# Patient Record
Sex: Female | Born: 1945 | Race: White | Hispanic: No | Smoking: Former smoker
Health system: Southern US, Community
[De-identification: ages and names within clinical notes are randomized; demographics above are authoritative.]

## PROBLEM LIST (undated history)

## (undated) DIAGNOSIS — T4145XA Adverse effect of unspecified anesthetic, initial encounter: Secondary | ICD-10-CM

## (undated) DIAGNOSIS — I714 Abdominal aortic aneurysm, without rupture, unspecified: Secondary | ICD-10-CM

## (undated) DIAGNOSIS — I080 Rheumatic disorders of both mitral and aortic valves: Secondary | ICD-10-CM

## (undated) DIAGNOSIS — Z9889 Other specified postprocedural states: Secondary | ICD-10-CM

## (undated) DIAGNOSIS — I10 Essential (primary) hypertension: Secondary | ICD-10-CM

## (undated) DIAGNOSIS — R410 Disorientation, unspecified: Secondary | ICD-10-CM

## (undated) DIAGNOSIS — E119 Type 2 diabetes mellitus without complications: Secondary | ICD-10-CM

## (undated) DIAGNOSIS — R011 Cardiac murmur, unspecified: Secondary | ICD-10-CM

## (undated) DIAGNOSIS — I2510 Atherosclerotic heart disease of native coronary artery without angina pectoris: Secondary | ICD-10-CM

## (undated) DIAGNOSIS — E7800 Pure hypercholesterolemia, unspecified: Secondary | ICD-10-CM

## (undated) DIAGNOSIS — I4891 Unspecified atrial fibrillation: Secondary | ICD-10-CM

## (undated) DIAGNOSIS — N185 Chronic kidney disease, stage 5: Secondary | ICD-10-CM

## (undated) DIAGNOSIS — R112 Nausea with vomiting, unspecified: Secondary | ICD-10-CM

## (undated) DIAGNOSIS — I6529 Occlusion and stenosis of unspecified carotid artery: Secondary | ICD-10-CM

## (undated) DIAGNOSIS — T8859XA Other complications of anesthesia, initial encounter: Secondary | ICD-10-CM

## (undated) DIAGNOSIS — I739 Peripheral vascular disease, unspecified: Secondary | ICD-10-CM

## (undated) DIAGNOSIS — M810 Age-related osteoporosis without current pathological fracture: Secondary | ICD-10-CM

## (undated) HISTORY — DX: Type 2 diabetes mellitus without complications: E11.9

## (undated) HISTORY — DX: Rheumatic disorders of both mitral and aortic valves: I08.0

## (undated) HISTORY — PX: AORTIC VALVE REPLACEMENT: SHX41

## (undated) HISTORY — DX: Chronic kidney disease, stage 5: N18.5

## (undated) HISTORY — DX: Occlusion and stenosis of unspecified carotid artery: I65.29

## (undated) HISTORY — DX: Disorientation, unspecified: R41.0

## (undated) HISTORY — DX: Peripheral vascular disease, unspecified: I73.9

## (undated) HISTORY — DX: Essential (primary) hypertension: I10

## (undated) HISTORY — DX: Pure hypercholesterolemia, unspecified: E78.00

## (undated) HISTORY — DX: Atherosclerotic heart disease of native coronary artery without angina pectoris: I25.10

## (undated) HISTORY — PX: TOTAL ABDOMINAL HYSTERECTOMY: SHX209

## (undated) HISTORY — DX: Cardiac murmur, unspecified: R01.1

## (undated) HISTORY — DX: Abdominal aortic aneurysm, without rupture, unspecified: I71.40

## (undated) HISTORY — DX: Abdominal aortic aneurysm, without rupture: I71.4

## (undated) HISTORY — DX: Unspecified atrial fibrillation: I48.91

## (undated) HISTORY — DX: Age-related osteoporosis without current pathological fracture: M81.0

---
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
## 1997-12-11 ENCOUNTER — Ambulatory Visit (HOSPITAL_COMMUNITY): Admission: RE | Admit: 1997-12-11 | Discharge: 1997-12-11 | Payer: Self-pay | Admitting: Orthopedic Surgery

## 2001-08-28 ENCOUNTER — Encounter: Admission: RE | Admit: 2001-08-28 | Discharge: 2001-08-28 | Payer: Self-pay | Admitting: Internal Medicine

## 2001-08-28 ENCOUNTER — Encounter: Payer: Self-pay | Admitting: Internal Medicine

## 2002-10-09 ENCOUNTER — Encounter: Payer: Self-pay | Admitting: Internal Medicine

## 2002-10-09 ENCOUNTER — Encounter: Admission: RE | Admit: 2002-10-09 | Discharge: 2002-10-09 | Payer: Self-pay | Admitting: Internal Medicine

## 2003-12-23 ENCOUNTER — Encounter: Admission: RE | Admit: 2003-12-23 | Discharge: 2003-12-23 | Payer: Self-pay | Admitting: Internal Medicine

## 2004-11-02 ENCOUNTER — Ambulatory Visit: Payer: Self-pay

## 2006-05-17 ENCOUNTER — Encounter: Admission: RE | Admit: 2006-05-17 | Discharge: 2006-05-17 | Payer: Self-pay | Admitting: Internal Medicine

## 2007-07-11 ENCOUNTER — Ambulatory Visit: Payer: Self-pay | Admitting: Vascular Surgery

## 2007-07-20 ENCOUNTER — Ambulatory Visit: Payer: Self-pay | Admitting: Gastroenterology

## 2007-08-08 ENCOUNTER — Encounter: Payer: Self-pay | Admitting: Gastroenterology

## 2007-08-08 ENCOUNTER — Ambulatory Visit: Payer: Self-pay | Admitting: Gastroenterology

## 2007-09-04 ENCOUNTER — Ambulatory Visit: Payer: Self-pay | Admitting: Gastroenterology

## 2007-09-04 LAB — CONVERTED CEMR LAB
Basophils Absolute: 0.1 10*3/uL (ref 0.0–0.1)
Basophils Relative: 1.4 % — ABNORMAL HIGH (ref 0.0–1.0)
Eosinophils Absolute: 0.3 10*3/uL (ref 0.0–0.6)
Eosinophils Relative: 2.6 % (ref 0.0–5.0)
Ferritin: 8.4 ng/mL — ABNORMAL LOW (ref 10.0–291.0)
Folate: 19.8 ng/mL
HCT: 36.2 % (ref 36.0–46.0)
Hemoglobin: 12.2 g/dL (ref 12.0–15.0)
Iron: 65 ug/dL (ref 42–145)
Lymphocytes Relative: 31.5 % (ref 12.0–46.0)
MCHC: 33.7 g/dL (ref 30.0–36.0)
MCV: 82.7 fL (ref 78.0–100.0)
Monocytes Absolute: 0.7 10*3/uL (ref 0.2–0.7)
Monocytes Relative: 7.2 % (ref 3.0–11.0)
Neutro Abs: 5.7 10*3/uL (ref 1.4–7.7)
Neutrophils Relative %: 57.3 % (ref 43.0–77.0)
Platelets: 282 10*3/uL (ref 150–400)
RBC: 4.38 M/uL (ref 3.87–5.11)
RDW: 13.5 % (ref 11.5–14.6)
Saturation Ratios: 11.7 % — ABNORMAL LOW (ref 20.0–50.0)
Transferrin: 397.6 mg/dL — ABNORMAL HIGH (ref >=212.0)
Vitamin B-12: 398 pg/mL (ref 211–911)
WBC: 9.9 10*3/uL (ref 4.5–10.5)

## 2008-06-10 ENCOUNTER — Encounter (INDEPENDENT_AMBULATORY_CARE_PROVIDER_SITE_OTHER): Payer: Self-pay | Admitting: Internal Medicine

## 2008-06-10 ENCOUNTER — Ambulatory Visit: Payer: Self-pay

## 2008-06-23 DIAGNOSIS — I10 Essential (primary) hypertension: Secondary | ICD-10-CM | POA: Insufficient documentation

## 2008-06-23 DIAGNOSIS — E7800 Pure hypercholesterolemia, unspecified: Secondary | ICD-10-CM | POA: Insufficient documentation

## 2008-06-23 DIAGNOSIS — R011 Cardiac murmur, unspecified: Secondary | ICD-10-CM | POA: Insufficient documentation

## 2008-06-25 ENCOUNTER — Ambulatory Visit: Payer: Self-pay | Admitting: Cardiology

## 2008-06-25 LAB — CONVERTED CEMR LAB
BUN: 13 mg/dL (ref 6–23)
CO2: 28 meq/L (ref 19–32)
Calcium: 9.6 mg/dL (ref 8.4–10.5)
Chloride: 107 meq/L (ref 96–112)
Creatinine, Ser: 1.1 mg/dL (ref 0.4–1.2)
GFR calc Af Amer: 65 mL/min
GFR calc non Af Amer: 53 mL/min
Glucose, Bld: 134 mg/dL — ABNORMAL HIGH (ref 70–99)
HCT: 37.8 % (ref 36.0–46.0)
Hemoglobin: 12.9 g/dL (ref 12.0–15.0)
INR: 1 (ref 0.8–1.0)
MCHC: 34.2 g/dL (ref 30.0–36.0)
MCV: 85.5 fL (ref 78.0–100.0)
Platelets: 239 10*3/uL (ref 150–400)
Potassium: 4 meq/L (ref 3.5–5.1)
Prothrombin Time: 10.4 s — ABNORMAL LOW (ref 10.9–13.3)
RBC: 4.42 M/uL (ref 3.87–5.11)
RDW: 12.2 % (ref 11.5–14.6)
Sodium: 140 meq/L (ref 135–145)
WBC: 9 10*3/uL (ref 4.5–10.5)
aPTT: 32.3 s — ABNORMAL HIGH (ref 21.7–29.8)

## 2008-06-27 ENCOUNTER — Inpatient Hospital Stay (HOSPITAL_BASED_OUTPATIENT_CLINIC_OR_DEPARTMENT_OTHER): Admission: RE | Admit: 2008-06-27 | Discharge: 2008-06-27 | Payer: Self-pay | Admitting: Cardiology

## 2008-06-27 ENCOUNTER — Ambulatory Visit: Payer: Self-pay | Admitting: Cardiology

## 2008-07-28 ENCOUNTER — Ambulatory Visit: Payer: Self-pay

## 2008-07-28 ENCOUNTER — Ambulatory Visit: Payer: Self-pay | Admitting: Cardiology

## 2008-08-04 ENCOUNTER — Ambulatory Visit: Payer: Self-pay | Admitting: Cardiology

## 2008-08-04 ENCOUNTER — Ambulatory Visit: Payer: Self-pay | Admitting: Thoracic Surgery (Cardiothoracic Vascular Surgery)

## 2008-08-07 ENCOUNTER — Ambulatory Visit (HOSPITAL_COMMUNITY)
Admission: RE | Admit: 2008-08-07 | Discharge: 2008-08-07 | Payer: Self-pay | Admitting: Thoracic Surgery (Cardiothoracic Vascular Surgery)

## 2008-08-07 ENCOUNTER — Encounter: Payer: Self-pay | Admitting: Thoracic Surgery (Cardiothoracic Vascular Surgery)

## 2008-08-08 IMAGING — CR DG CHEST 2V
2 series · 2 of 2 positions shown · non-contrast
Comparison: None available.

CLINICAL DATA: Aortic stenosis.

CHEST - 2 VIEW

[view not recorded (1 of 2)]
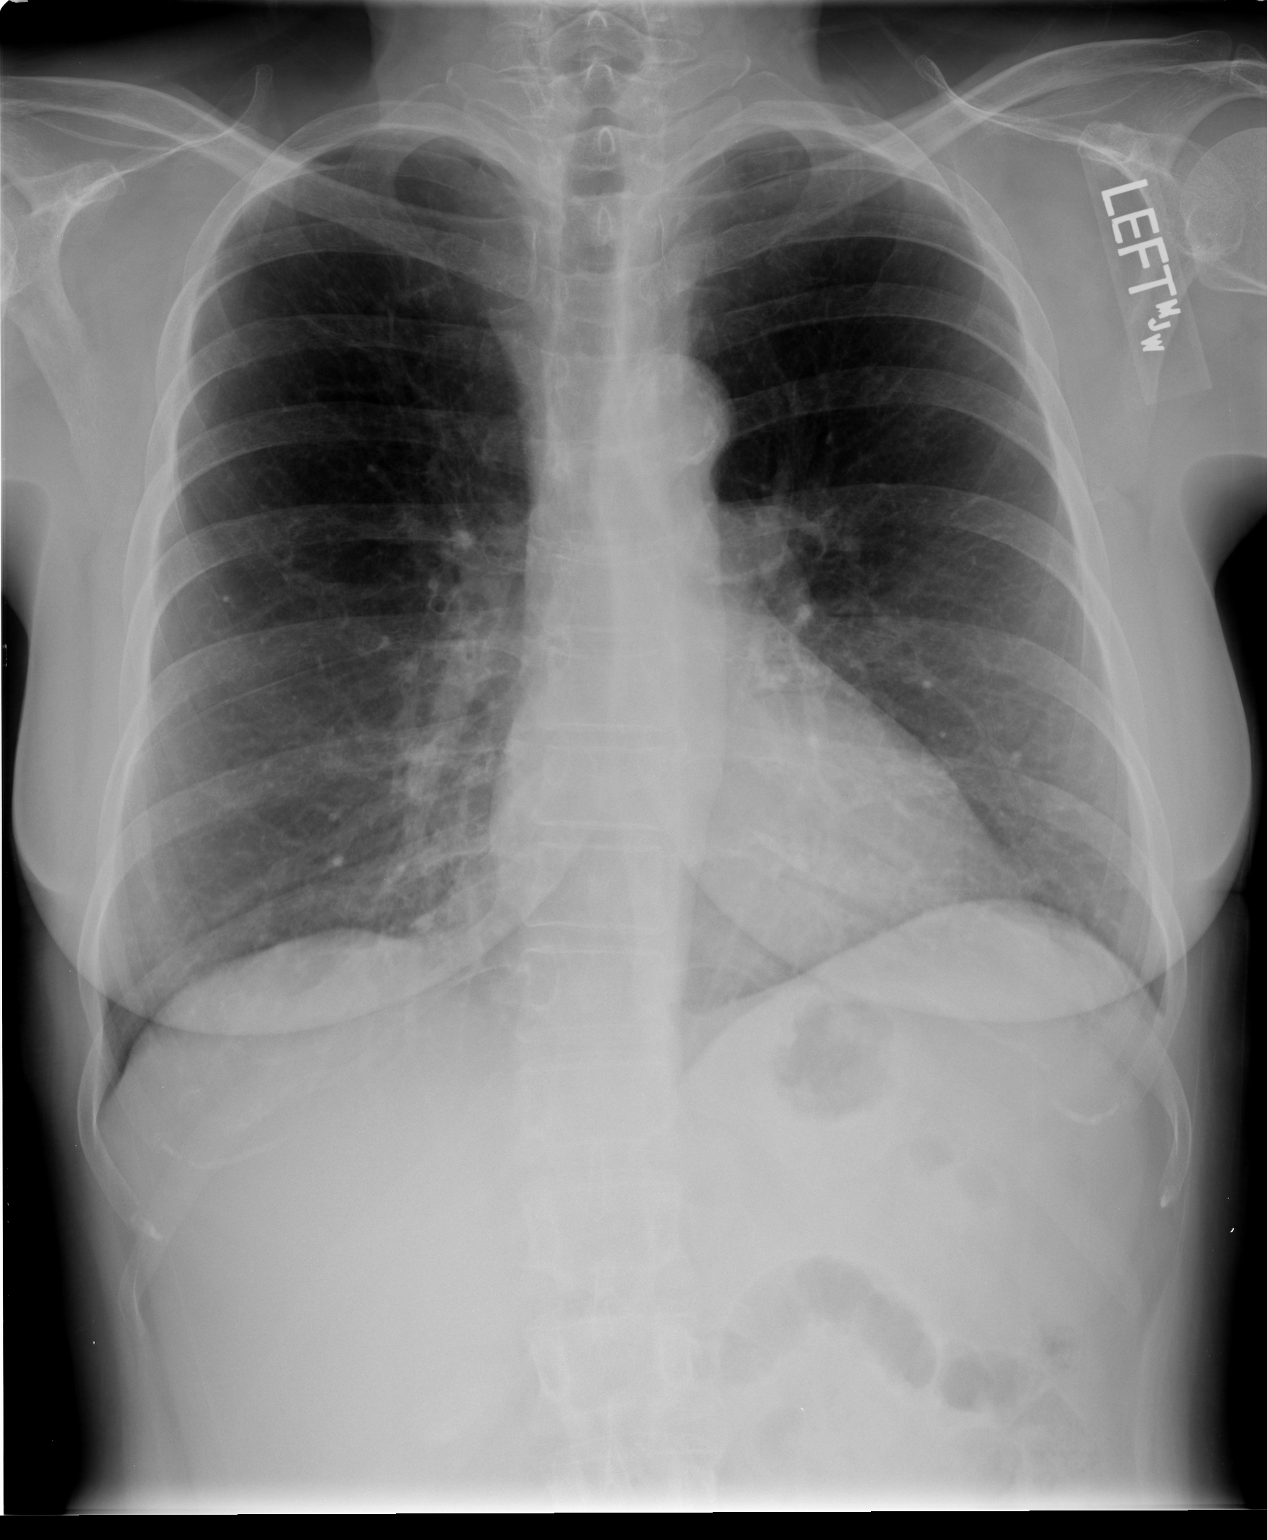

[view not recorded (2 of 2)]
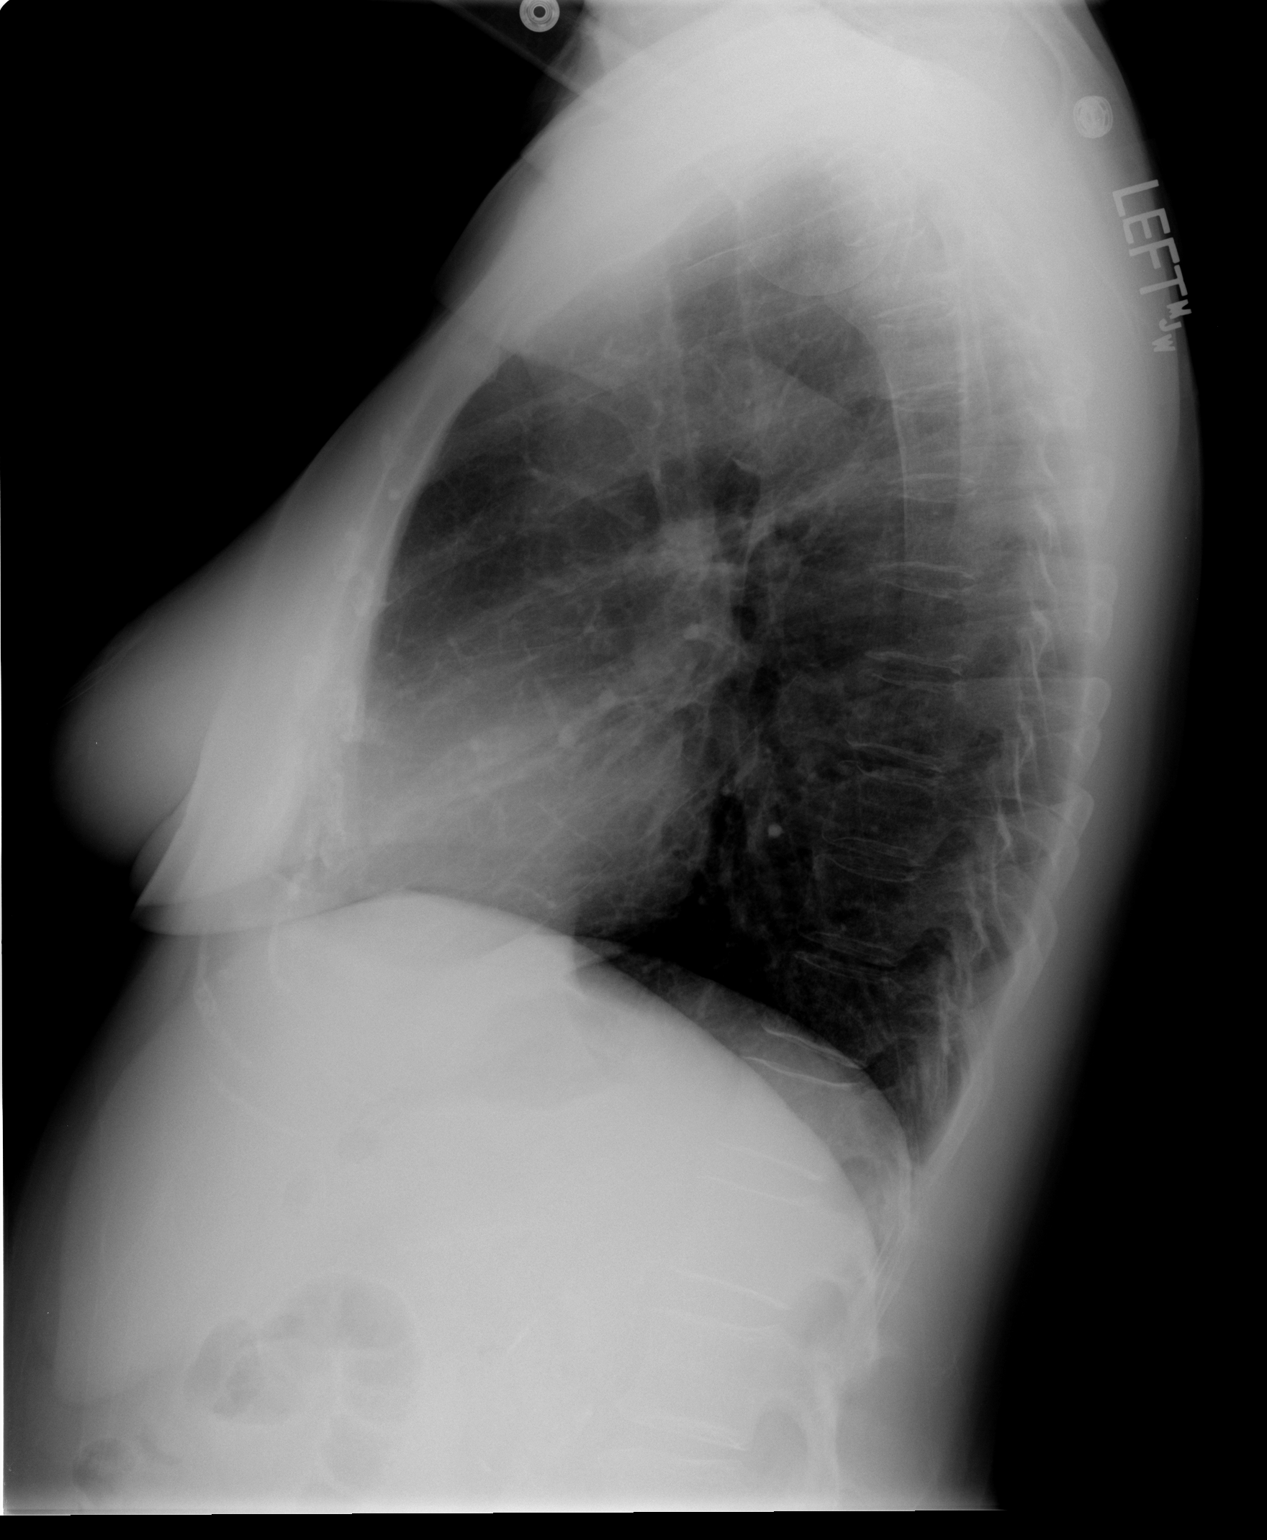

[2 of 2 positions shown; findings below may reference images not displayed]

FINDINGS: The chest is hyperexpanded with coarsened pulmonary
interstitium.  There is no focal airspace disease or effusion.
Heart size is normal.
IMPRESSION: Findings compatible with COPD/emphysema.  No acute process.

## 2008-08-14 ENCOUNTER — Inpatient Hospital Stay (HOSPITAL_COMMUNITY)
Admission: RE | Admit: 2008-08-14 | Discharge: 2008-08-25 | Payer: Self-pay | Admitting: Thoracic Surgery (Cardiothoracic Vascular Surgery)

## 2008-08-14 ENCOUNTER — Ambulatory Visit: Payer: Self-pay | Admitting: Thoracic Surgery (Cardiothoracic Vascular Surgery)

## 2008-08-14 ENCOUNTER — Encounter: Payer: Self-pay | Admitting: Thoracic Surgery (Cardiothoracic Vascular Surgery)

## 2008-08-14 DIAGNOSIS — Z952 Presence of prosthetic heart valve: Secondary | ICD-10-CM | POA: Insufficient documentation

## 2008-08-14 IMAGING — CR DG CHEST 1V PORT
1 series · 1 of 1 positions shown · non-contrast
Comparison: [DATE]

CLINICAL DATA: Status post CABG.  Aortic stenosis.  Chest tube.

PORTABLE CHEST - 1 VIEW

[view not recorded]
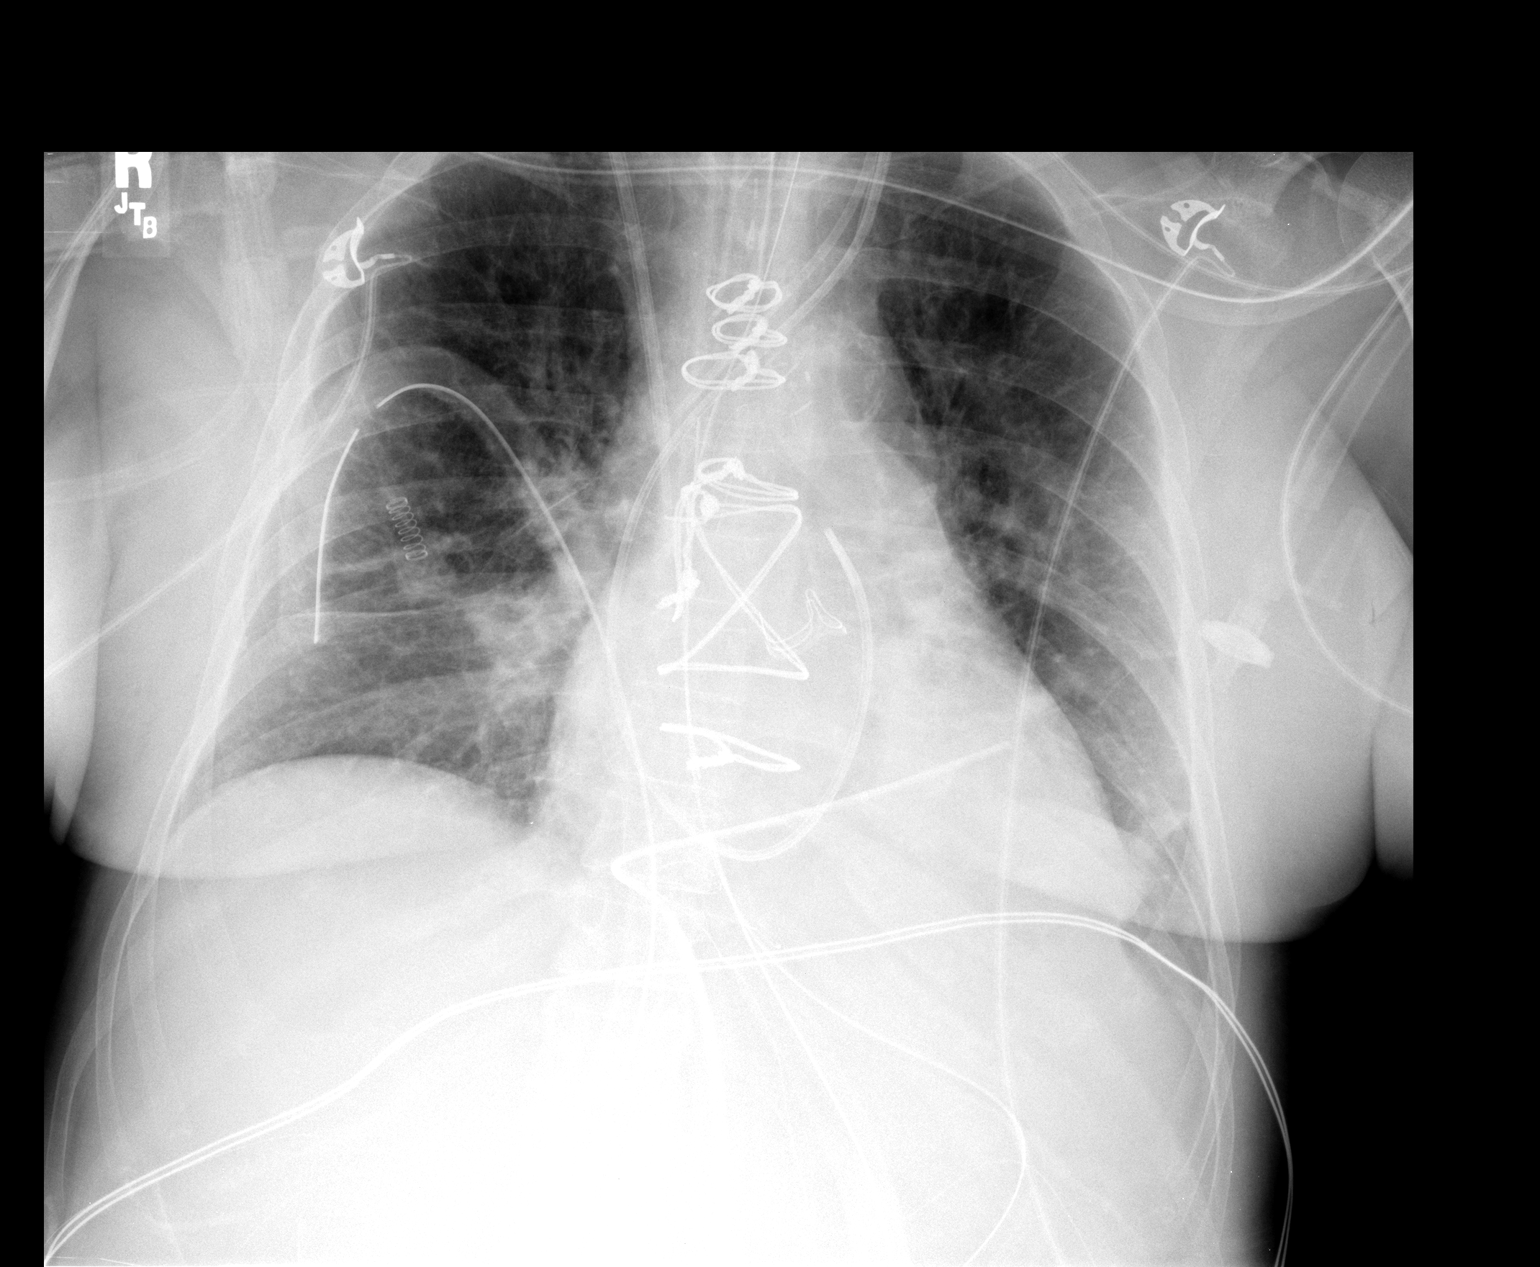

[1 of 1 positions shown; findings below may reference images not displayed]

FINDINGS: Interval median sternotomy.  Six intact wires.
Endotracheal tube terminates 5.0 cm above the carina.  A left IJ
Swan-Ganz catheter terminates at the pulmonary outflow tract.  A
left IJ central line terminates at the high SVC.  A right IJ
central line terminates at the high to mid SVC.

Bilateral chest tubes.  Probable mediastinal drain.  Nasogastric
tube extends beyond the  inferior aspect of the film.

No pneumothorax.

Midline trachea. Heart size upper limits of normal.  No pleural
effusion. Low lung volumes with resultant pulmonary interstitial
prominence.  Patchy bibasilar atelectasis.
IMPRESSION: 1.  Expected appearance after median sternotomy.
2.  Support apparatus appropriately positioned without
pneumothorax.
3.  Low lung volumes with patchy bibasilar atelectasis.

## 2008-08-15 IMAGING — CR DG CHEST 1V PORT
1 series · 1 of 1 positions shown · non-contrast
Comparison: 1 day prior

CLINICAL DATA: Air stenosis.  Status post the AVR

PORTABLE CHEST - 1 VIEW

[AP]
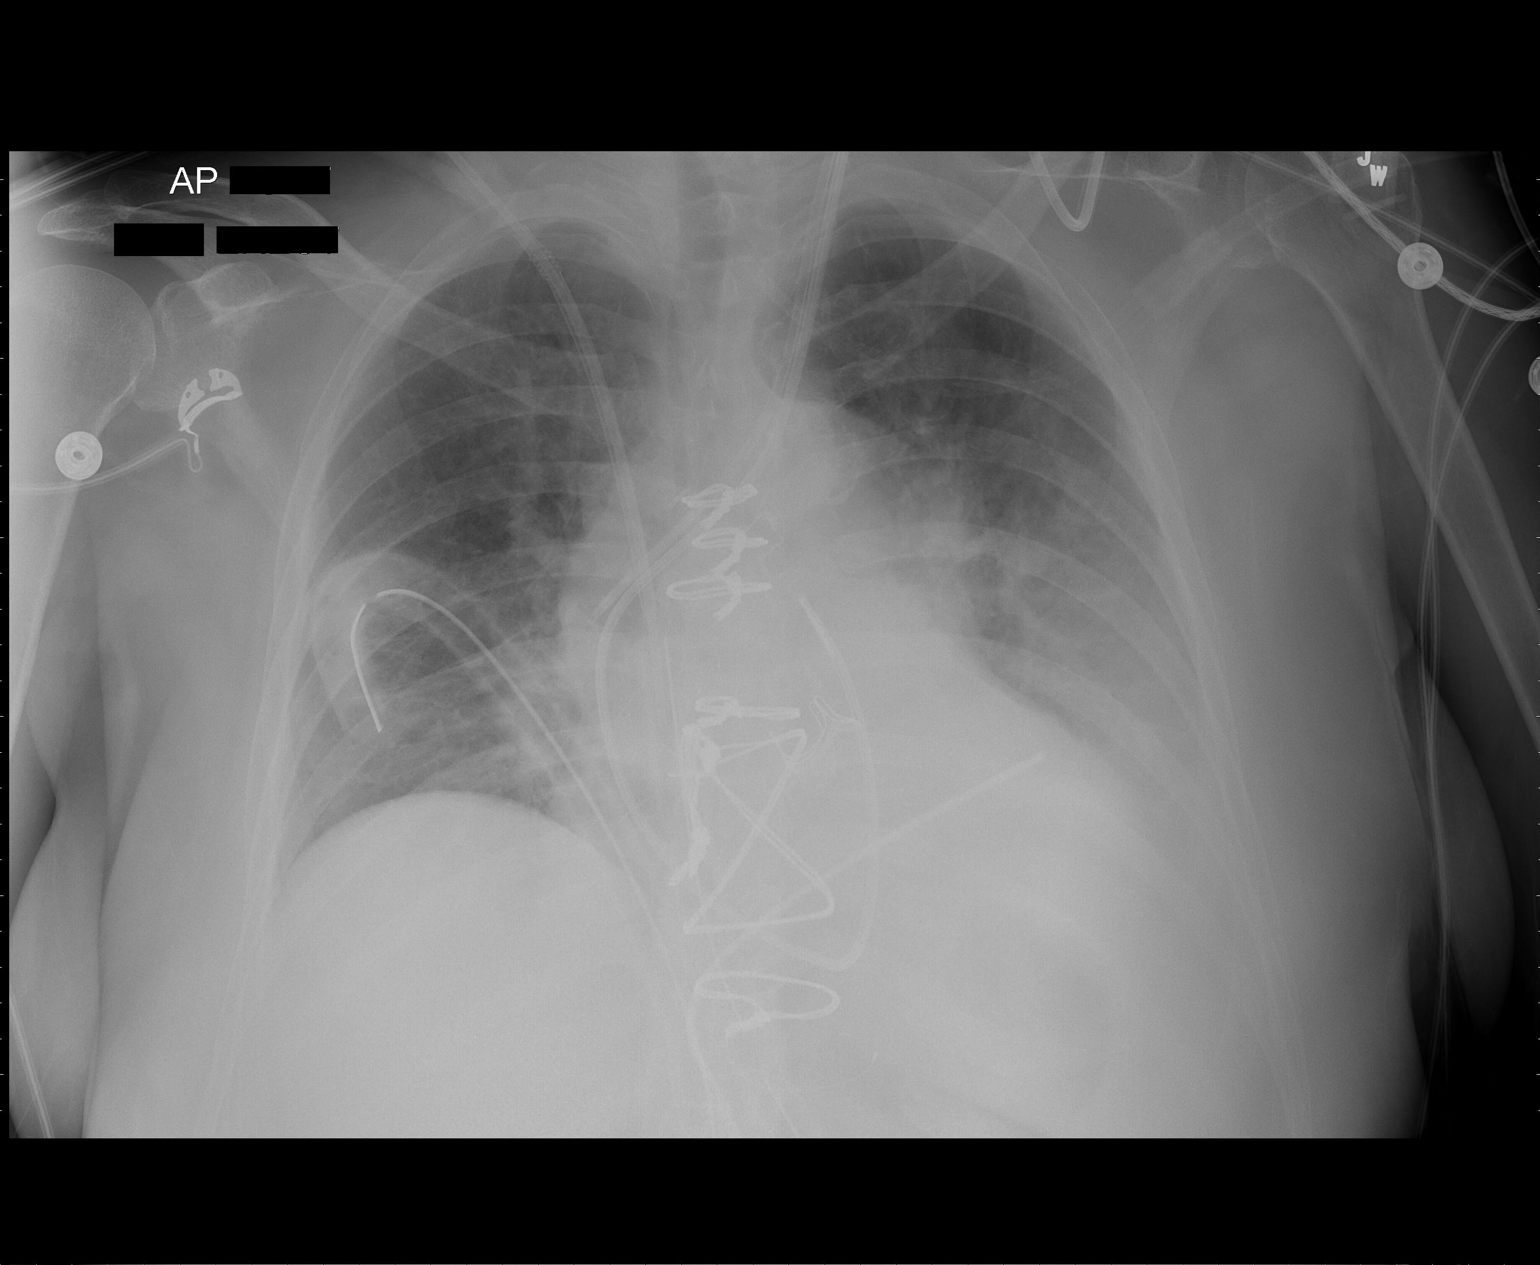

[1 of 1 positions shown; findings below may reference images not displayed]

FINDINGS: Removal of endotracheal and nasogastric tubes.  Left
sided Swan-Ganz catheter and bilateral internal jugular central
lines are again identified.

Bilateral chest tubes and mediastinal drain are unchanged. No
pneumothorax.  Midline trachea. Normal heart size for level of
inspiration.  Developing small left pleural effusion.  Decreased
lung volumes with mild interstitial edema.  Progressive left base
atelectasis.
IMPRESSION: 1.  Interval extubation with decreased lung volumes and increased
left base atelectasis.
2.  Development of mild interstitial edema and small left pleural
effusion.
3. No pneumothorax.

## 2008-08-16 ENCOUNTER — Encounter: Payer: Self-pay | Admitting: Cardiothoracic Surgery

## 2008-08-16 IMAGING — CR DG CHEST 1V PORT
1 series · 1 of 1 positions shown · non-contrast
Comparison: [DATE]

CLINICAL DATA: Aortic valve repair.

PORTABLE CHEST - 1 VIEW

[view not recorded]
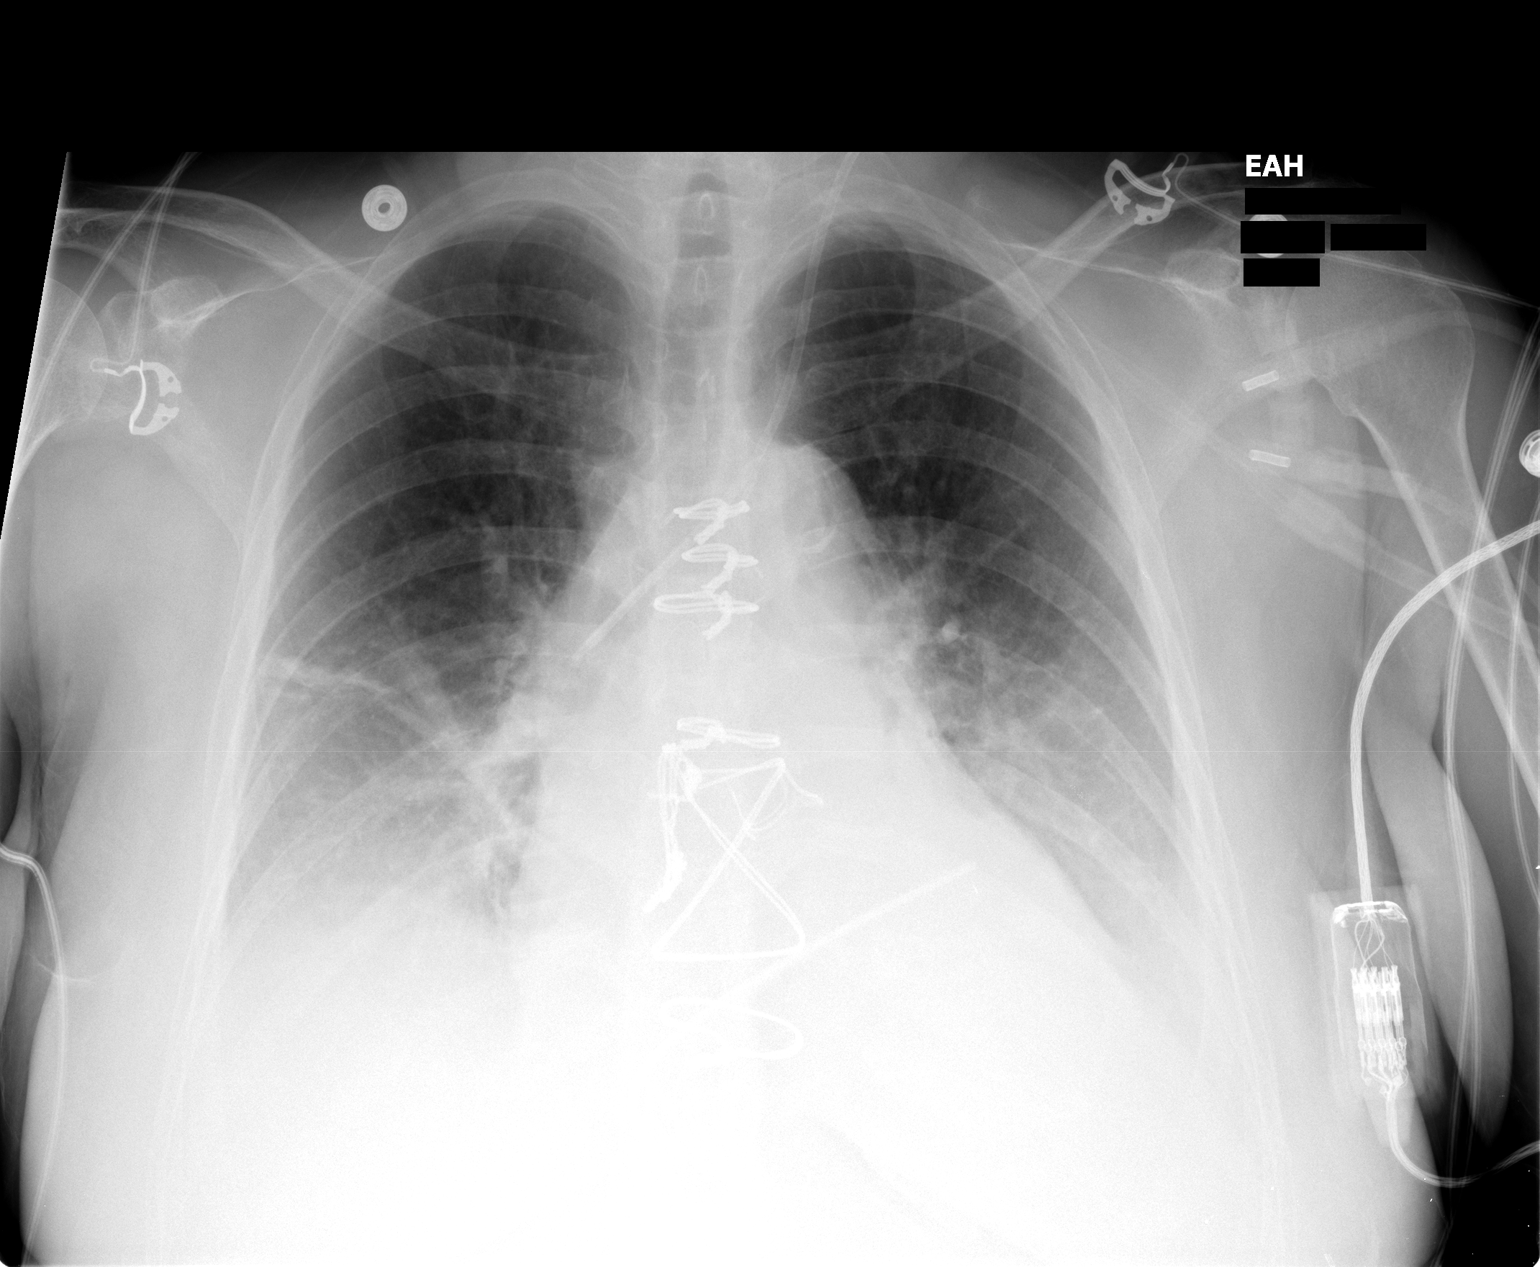

[1 of 1 positions shown; findings below may reference images not displayed]

FINDINGS: Interval removal of Swan-Ganz catheter and right chest
tube.  No pneumothorax.  Stable cardiomegaly.  Increasing right
base airspace disease, likely atelectasis.  Stable left basilar
atelectasis.  Suspect small layering bilateral effusions.
IMPRESSION: Interval removal of right chest tube without pneumothorax.

Stable left base atelectasis.  New right base atelectasis.

Small effusions.

## 2008-08-16 IMAGING — CR DG CHEST 1V PORT
1 series · 1 of 1 positions shown · non-contrast
Comparison: Earlier film of the same day

CLINICAL DATA: There is stenosis, PICC placement

PORTABLE CHEST - 1 VIEW

[view not recorded]
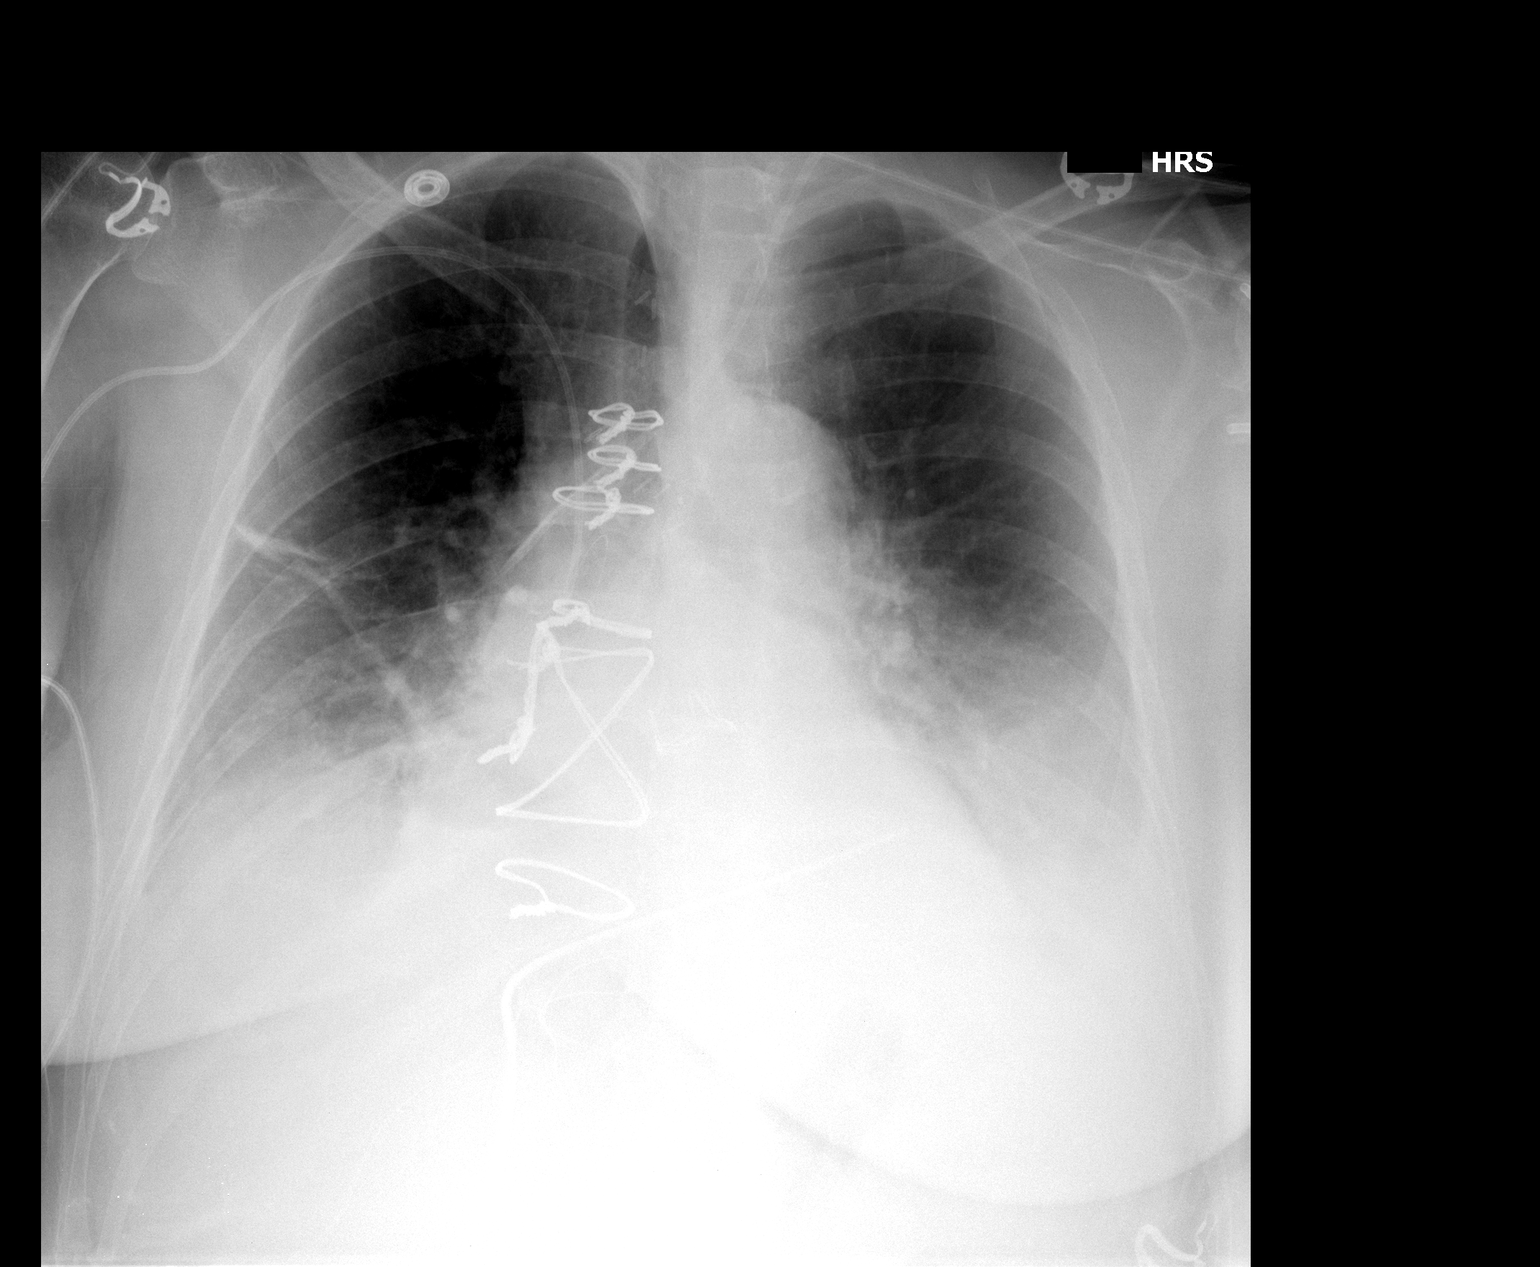

[1 of 1 positions shown; findings below may reference images not displayed]

FINDINGS: A right arm PICC has been advanced to the cavoatrial
junction.  Left IJ central line stable.  Changes of median
sternotomy and aortic valve replacement surgery.  Bibasilar
atelectasis/infiltrates, left greater than right, with probable
left effusion as before.  Heart size upper limits normal for
technique.  Mild central pulmonary vascular congestion.
IMPRESSION: 1.  Right arm PICC to cavoatrial junction.
2.  Otherwise stable appearance since earlier film

## 2008-08-17 IMAGING — CR DG CHEST 1V PORT
1 series · 1 of 1 positions shown · non-contrast
Comparison: [DATE]

CLINICAL DATA: Aortic stenosis

PORTABLE CHEST - 1 VIEW [NI] hours

[view not recorded]
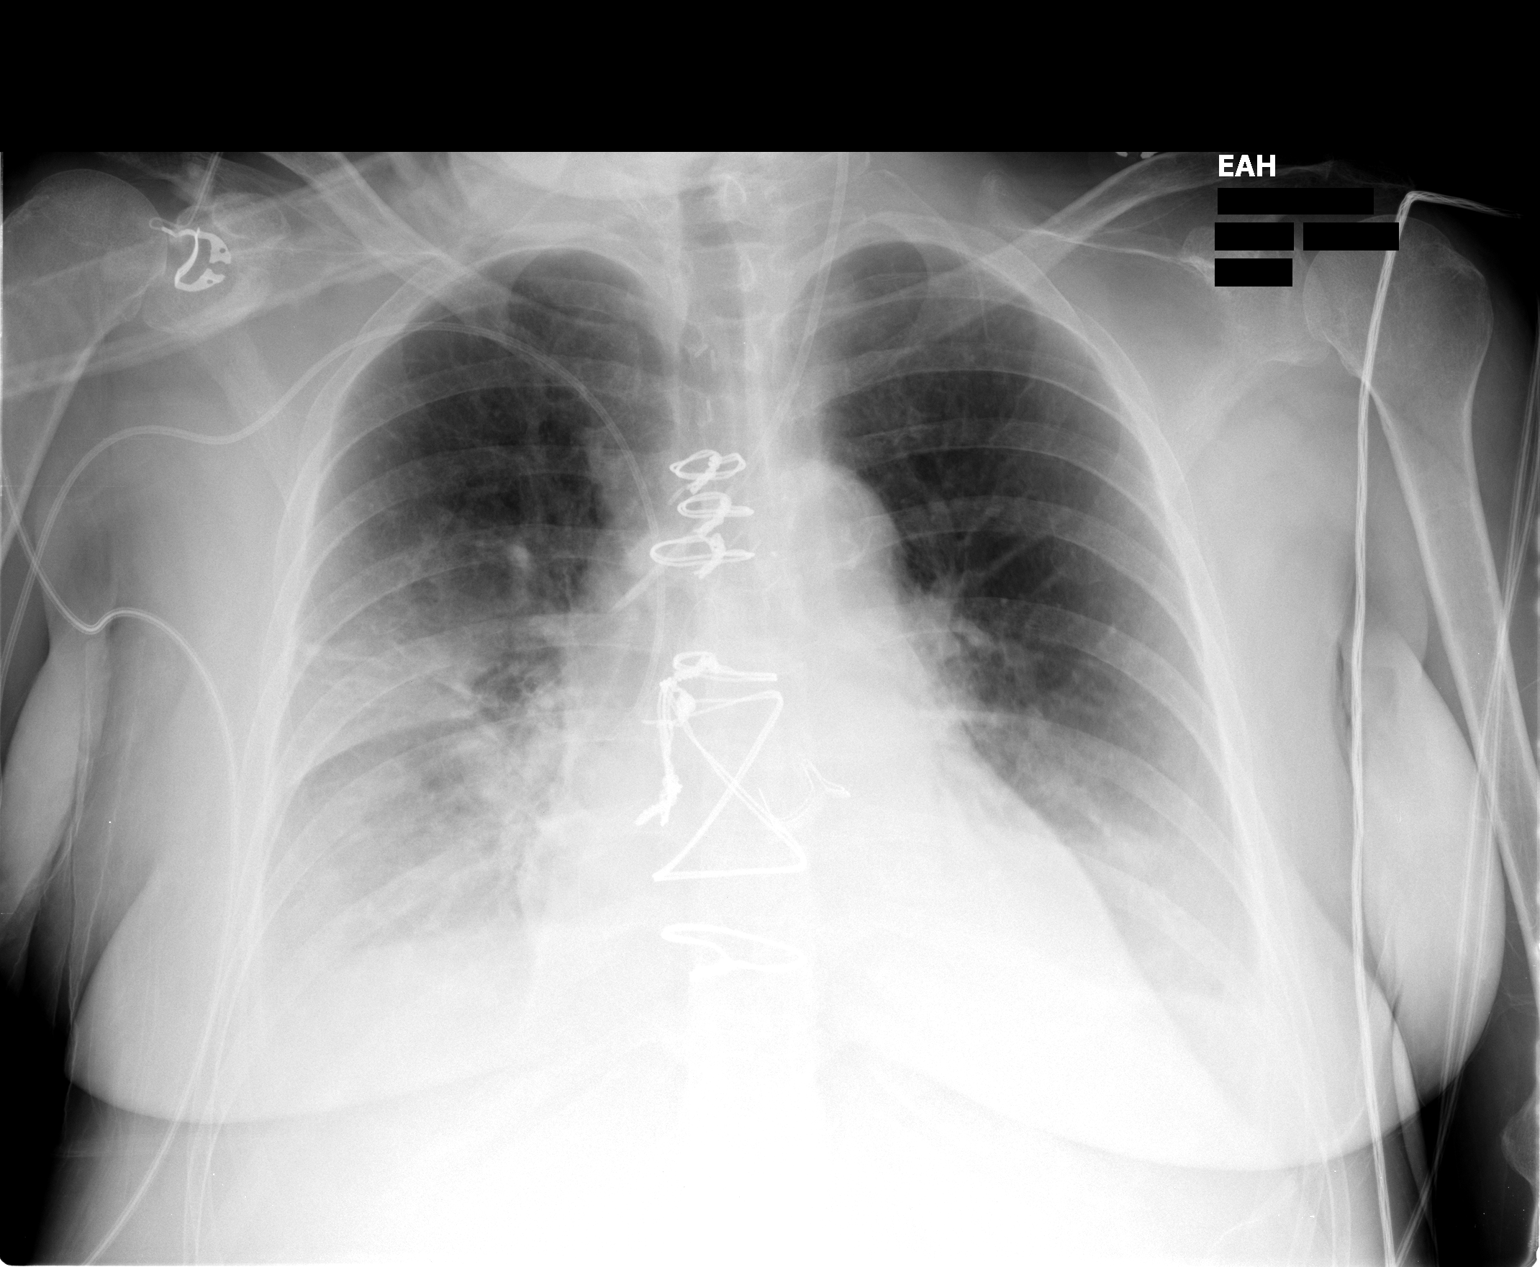

[1 of 1 positions shown; findings below may reference images not displayed]

FINDINGS: Mild atelectasis right midlung zone.  Small pleural
effusions. Left lower lobe atelectasis / consolidation.  Central
venous catheters unchanged in position. No pneumothorax.
IMPRESSION: No pneumothorax.  Small pleural effusions and bilateral lower lung
zone atelectatic changes.

## 2008-08-18 IMAGING — CR DG CHEST 1V PORT
1 series · 1 of 1 positions shown · non-contrast
Comparison: [DATE]

CLINICAL DATA: Aortic stenosis/post CABG

PORTABLE CHEST - 1 VIEW

[AP]
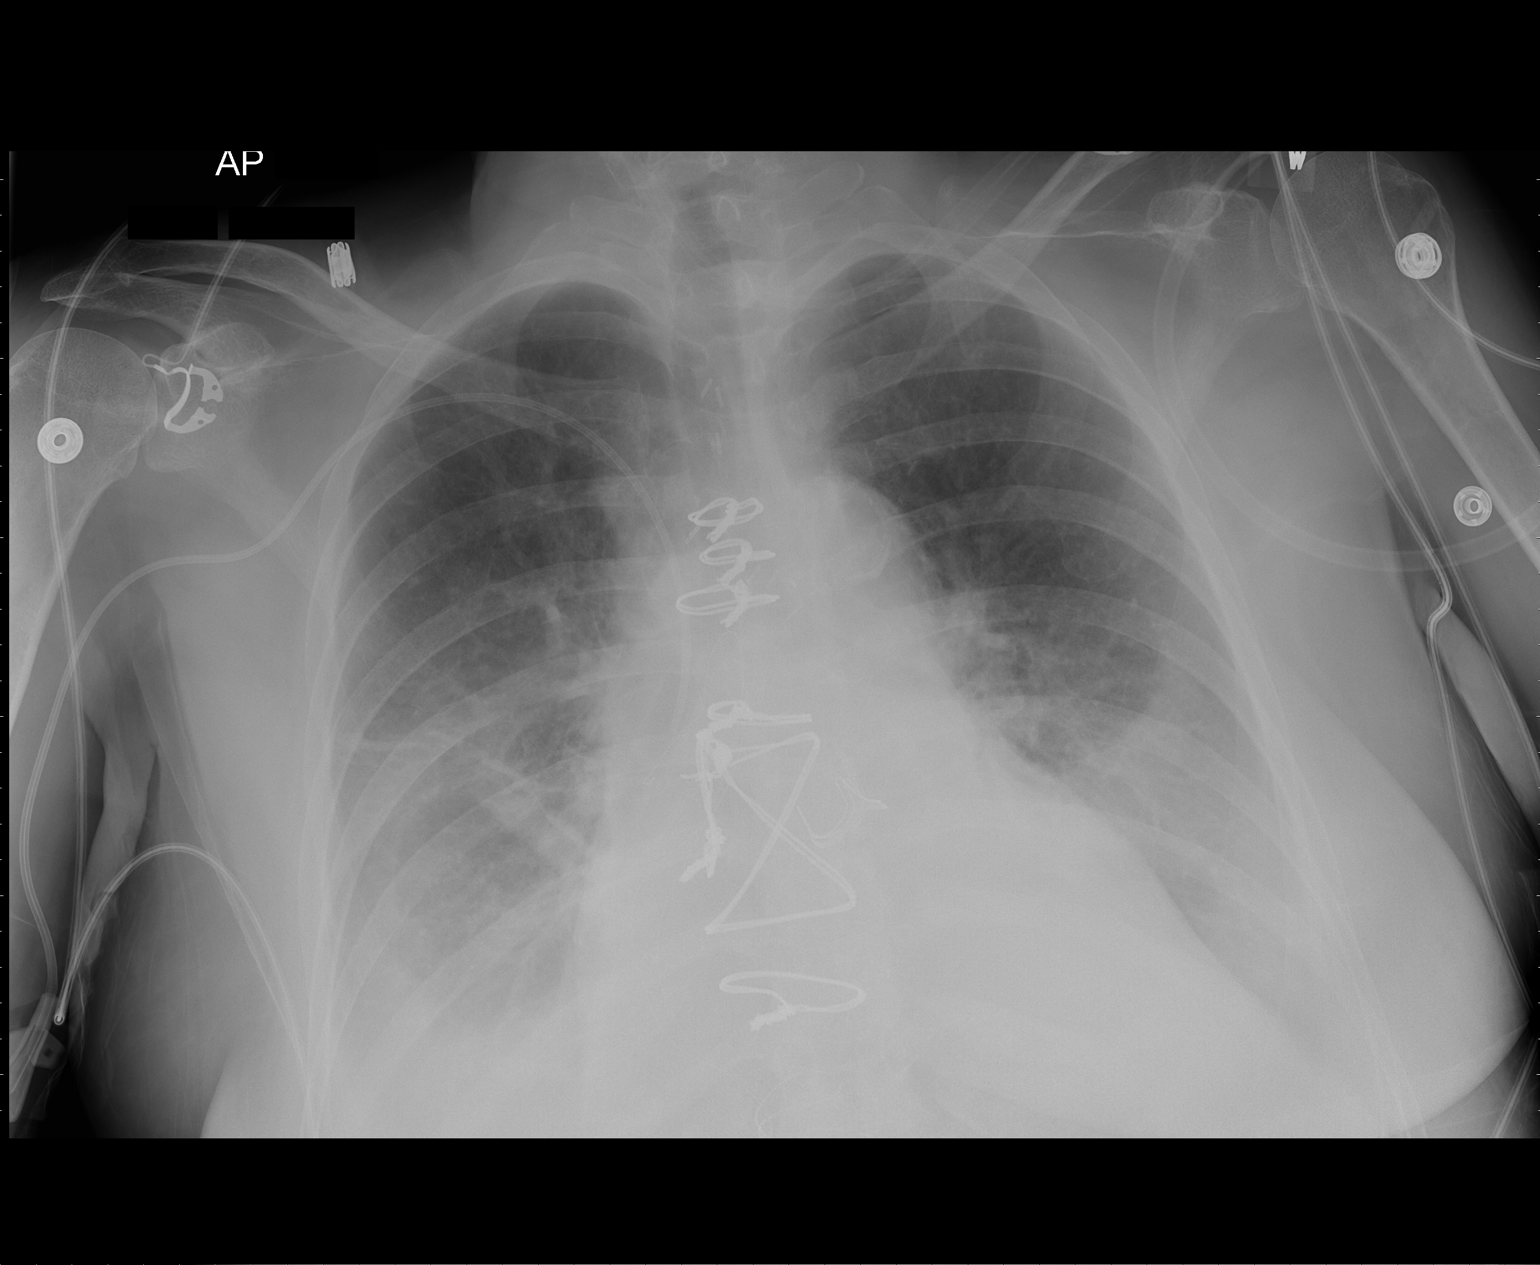

[1 of 1 positions shown; findings below may reference images not displayed]

FINDINGS: Post median sternotomy with aortic valve replacement.
Bibasilar densities about the same.  No definite congestive heart
failure or pneumothorax.  Right central line unchanged.
IMPRESSION: Postoperative changes - no new findings or significant interval
change.

## 2008-08-19 IMAGING — CR DG CHEST 1V PORT
1 series · 1 of 1 positions shown · non-contrast
Comparison: Portable chest x-rays yesterday and dating back to
[DATE].

CLINICAL DATA: Postop aortic valve replacement for aortic stenosis.
Follow up basilar atelectasis.

PORTABLE CHEST - 1 VIEW [DATE]/[PHONE_NUMBER] hours:

[view not recorded]
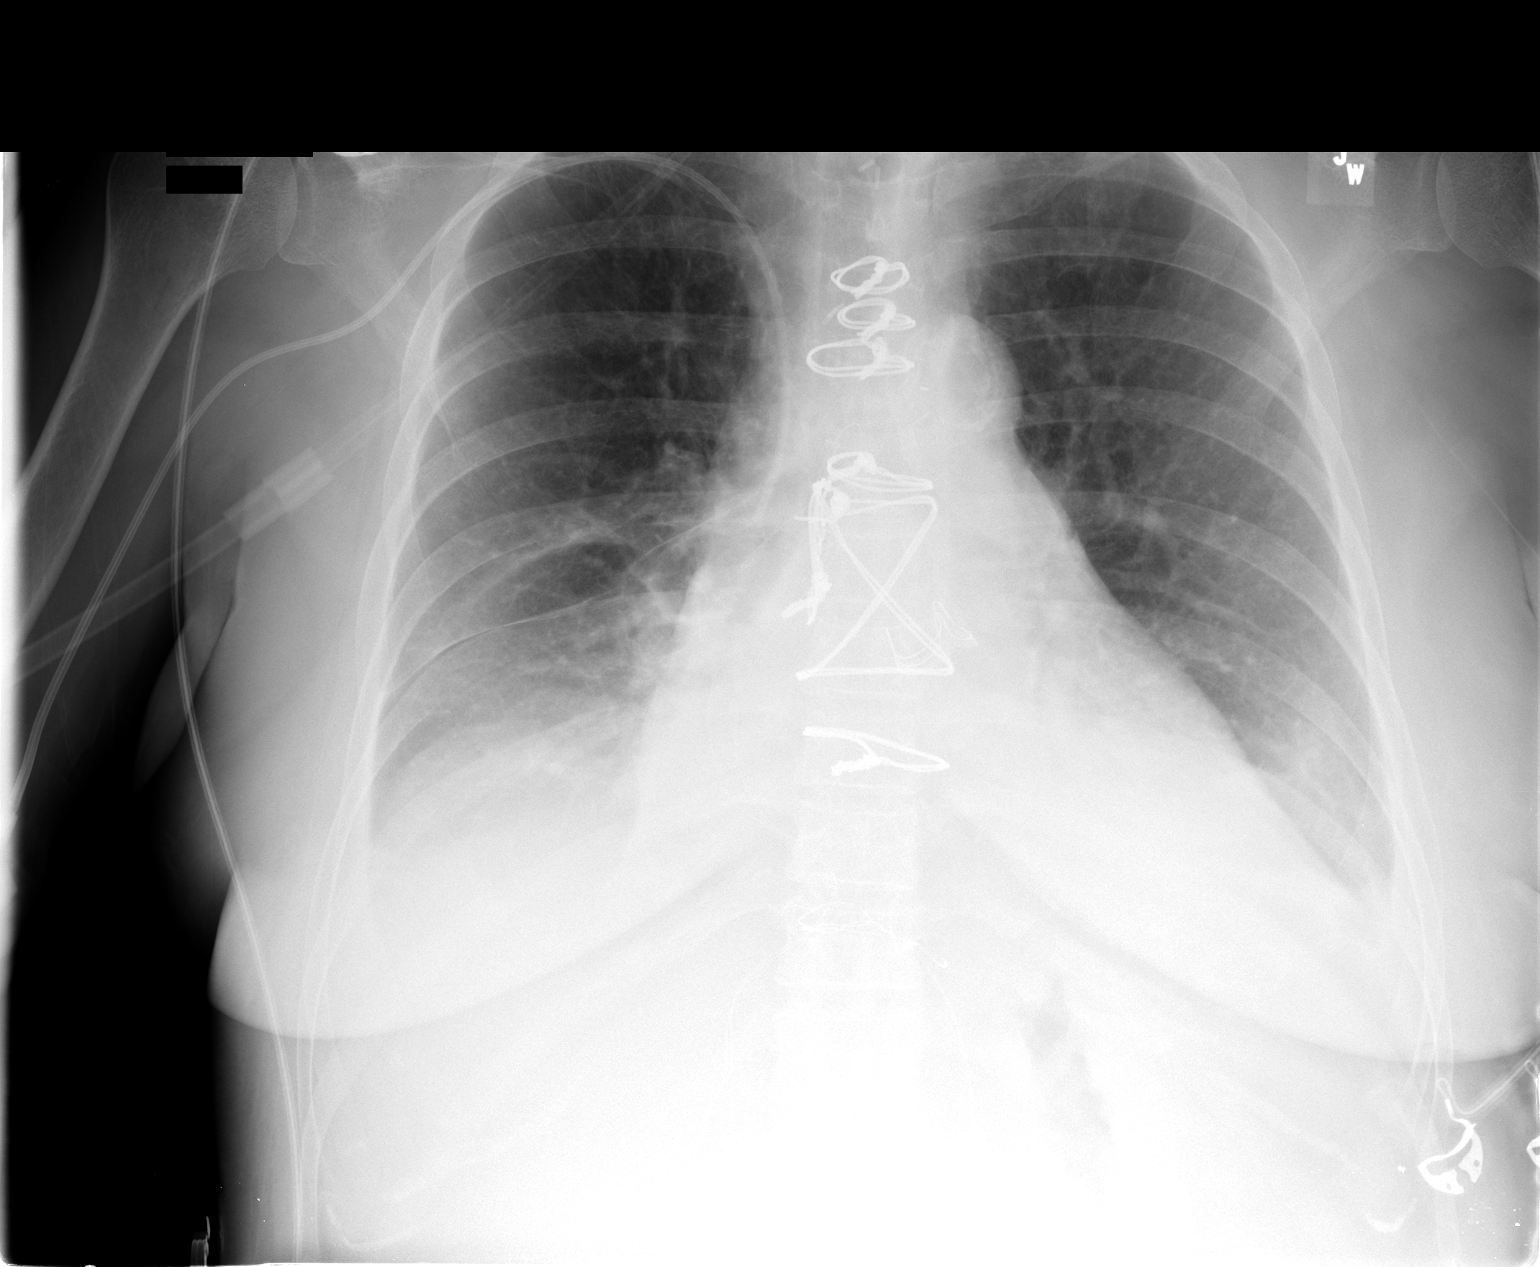

[1 of 1 positions shown; findings below may reference images not displayed]

FINDINGS: Status post aortic valve replacement.  Heart mildly
enlarged but stable.  Right arm PICC tip remains in the SVC.
Interval improvement in aeration in the lung bases, though moderate
atelectasis persists in the lower lobes.  Small bilateral effusions
persist, improved since yesterday.  No new pulmonary parenchymal
abnormalities.
IMPRESSION: Improving bilateral pleural effusions.  Improved aeration in the
lower lobes, though moderate atelectasis persists.  No new
abnormalities.

## 2008-08-20 ENCOUNTER — Ambulatory Visit: Payer: Self-pay | Admitting: Thoracic Surgery (Cardiothoracic Vascular Surgery)

## 2008-08-20 IMAGING — CR DG CHEST 2V
2 series · 2 of 2 positions shown · non-contrast
Comparison: [DATE]

CLINICAL DATA: Weakness, chest pain, aortic valve replacement.

CHEST - 2 VIEW

[w chest pa]
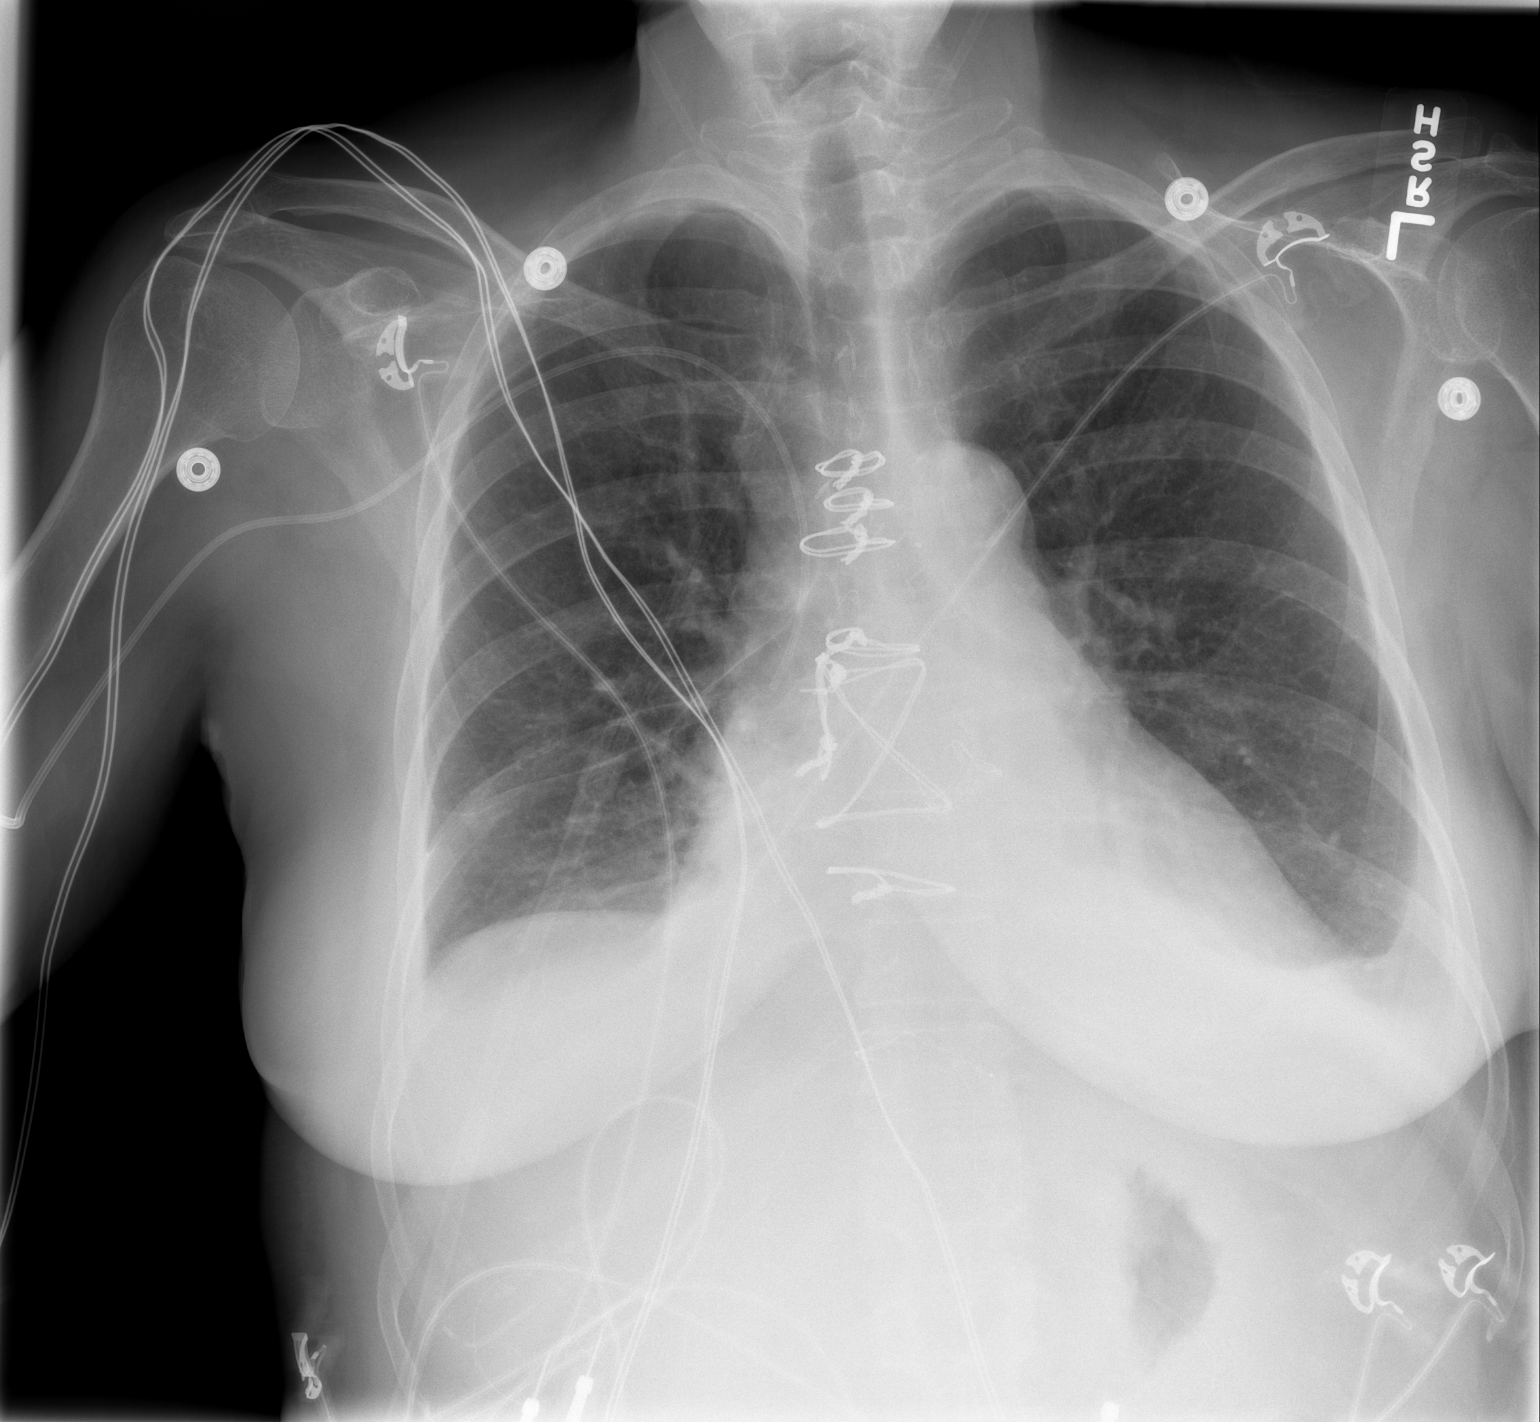

[w chest lat]
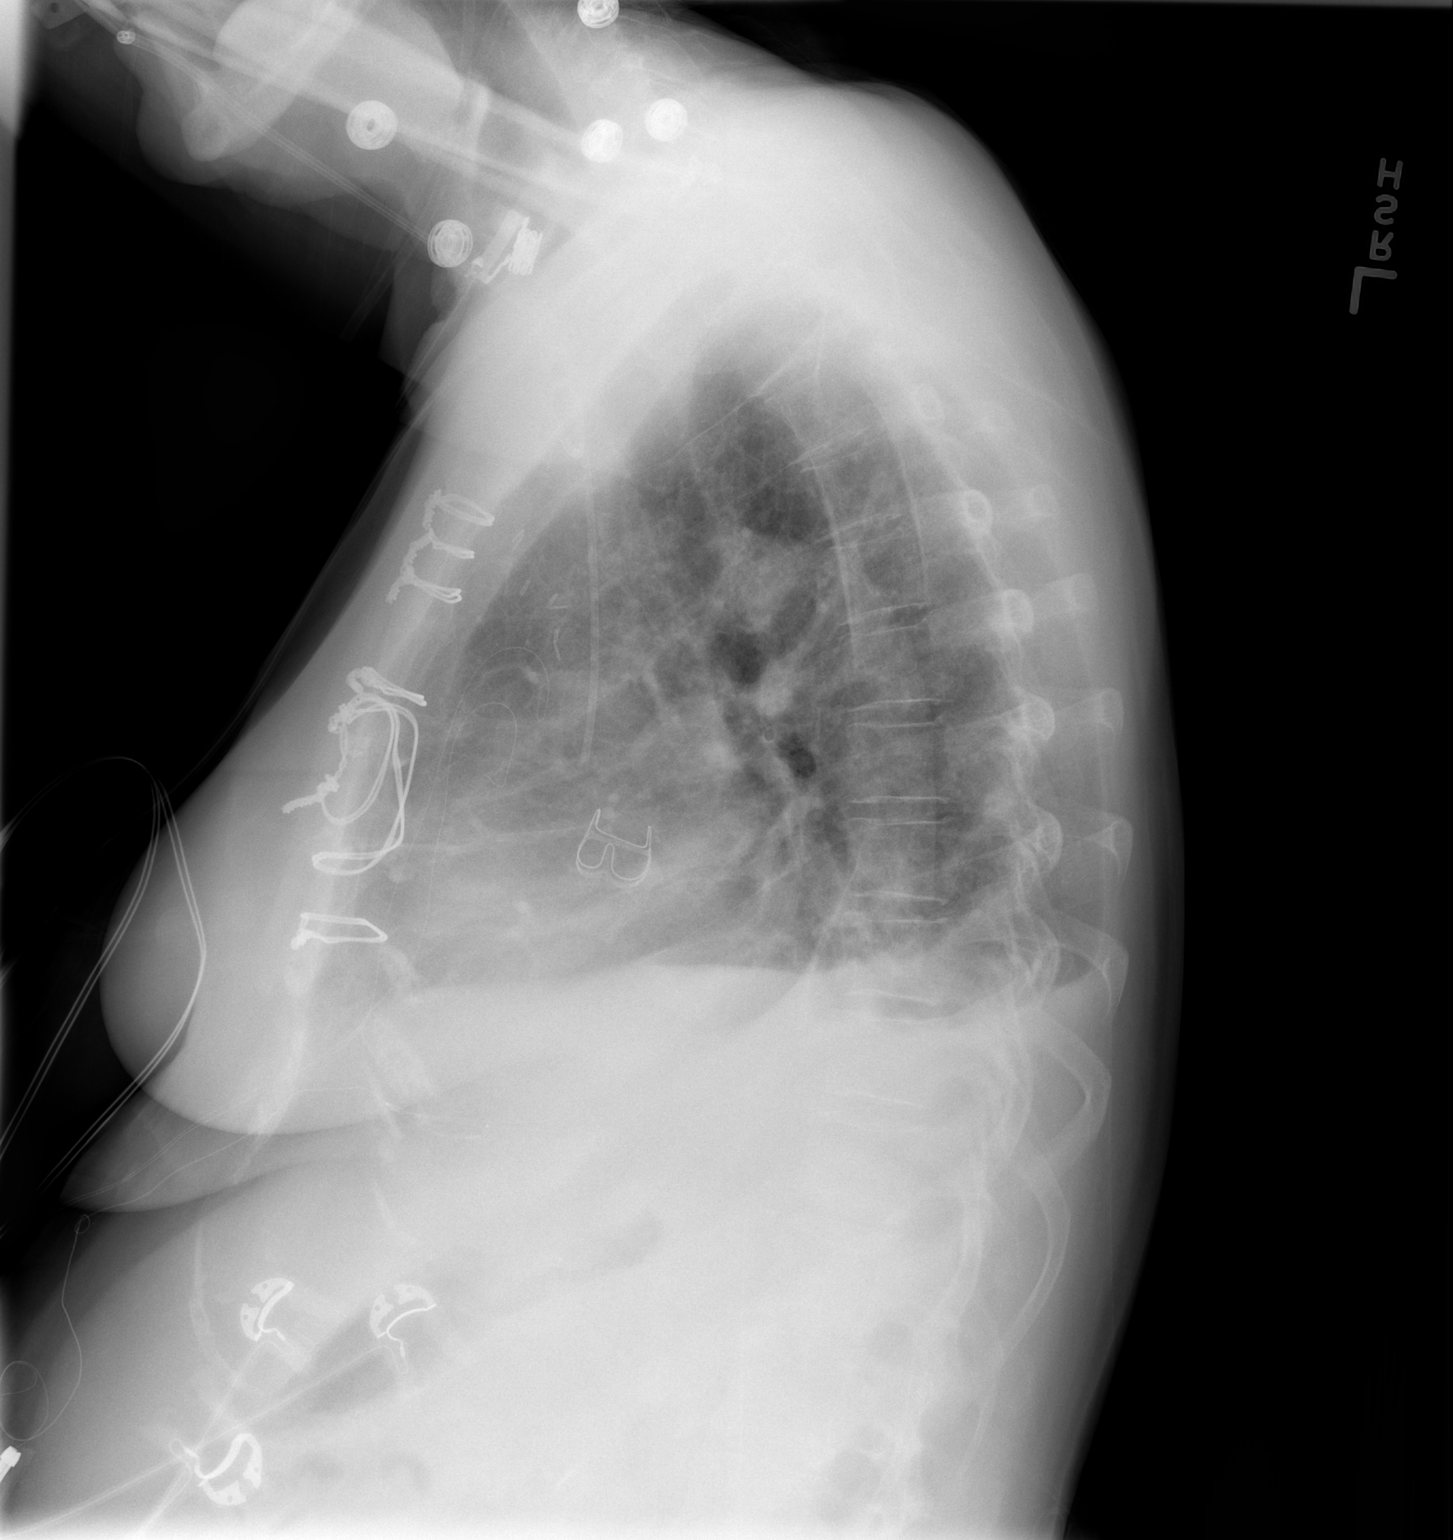

[2 of 2 positions shown; findings below may reference images not displayed]

FINDINGS: Trachea is midline.  Heart size stable.  Thoracic aorta
is calcified.  Right PICC tip projects over the SVC.  There are
small bilateral pleural effusions with bibasilar atelectasis.
Lungs do not appear edematous.  No pneumothorax.
IMPRESSION: Small bilateral pleural effusions and bibasilar atelectasis.

## 2008-09-15 ENCOUNTER — Ambulatory Visit: Payer: Self-pay | Admitting: Thoracic Surgery (Cardiothoracic Vascular Surgery)

## 2008-09-15 ENCOUNTER — Encounter
Admission: RE | Admit: 2008-09-15 | Discharge: 2008-09-15 | Payer: Self-pay | Admitting: Thoracic Surgery (Cardiothoracic Vascular Surgery)

## 2008-09-15 IMAGING — CR DG CHEST 2V
2 series · 2 of 2 positions shown · non-contrast
Comparison: Chest x-ray of [DATE]

CLINICAL DATA: Aortic valve replacement 1 month ago, follow-up
seen, some chest soreness

CHEST - 2 VIEW

[view not recorded (1 of 2)]
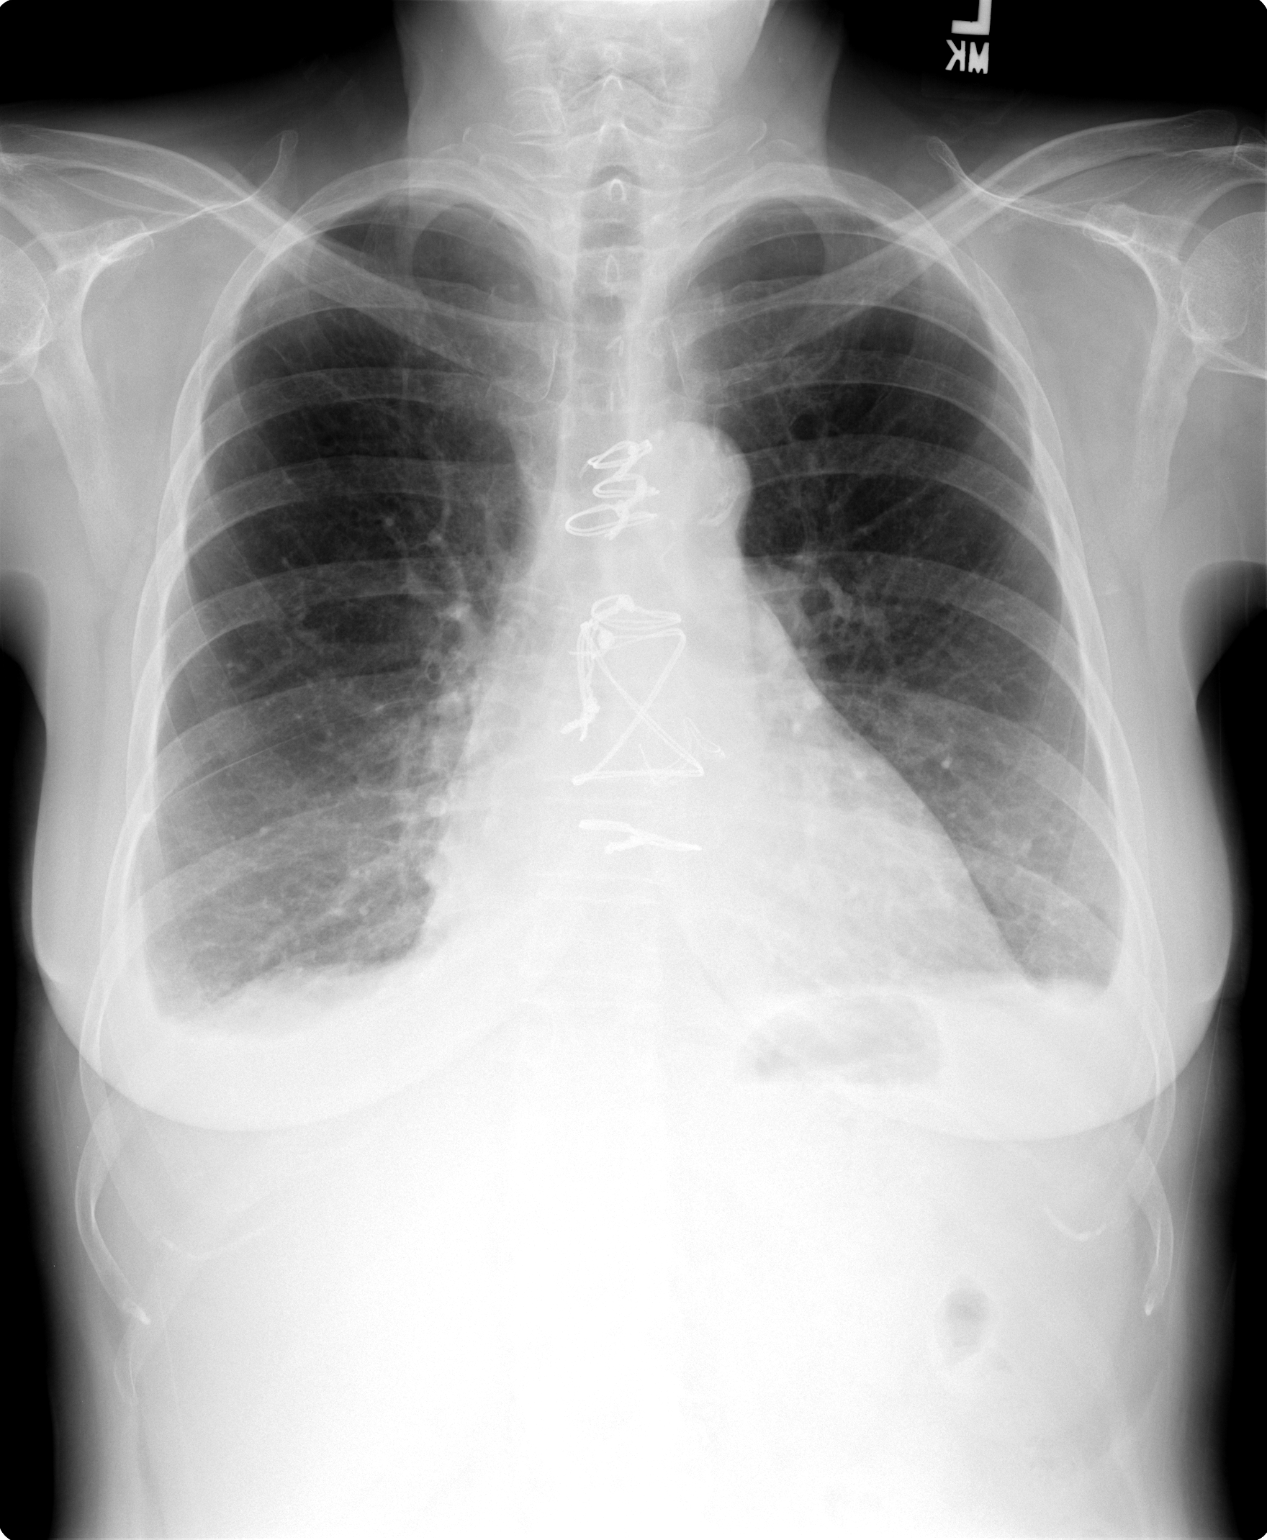

[view not recorded (2 of 2)]
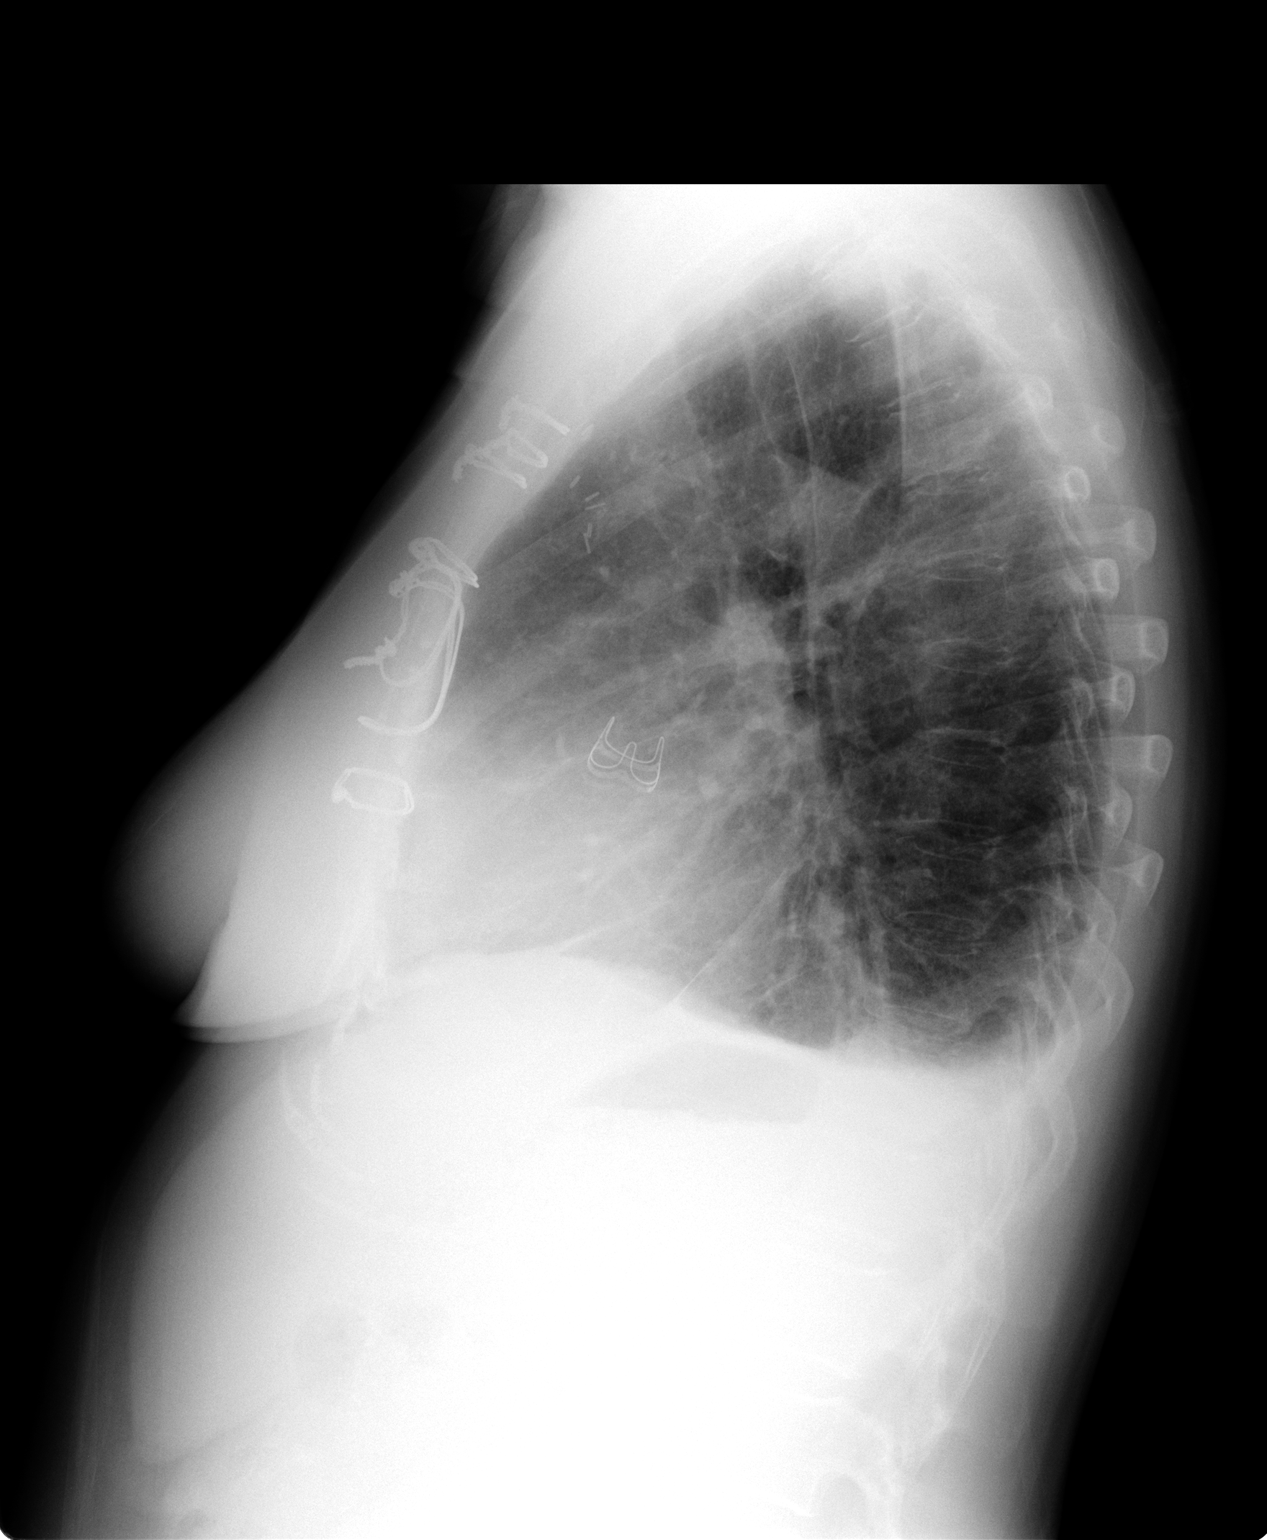

[2 of 2 positions shown; findings below may reference images not displayed]

FINDINGS: Aeration has improved.  However there are still small
bilateral pleural effusions present with mild basilar atelectasis.
No pneumothorax is seen.  Cardiomegaly is stable.  Median
sternotomy sutures are noted and aortic valve replacement is again
noted.  No bony abnormality is seen.
IMPRESSION: Improved aeration.  Persistent small effusions and basilar
atelectasis.

## 2008-10-01 ENCOUNTER — Encounter: Payer: Self-pay | Admitting: Cardiology

## 2008-10-07 ENCOUNTER — Ambulatory Visit: Payer: Self-pay | Admitting: Cardiology

## 2008-10-07 DIAGNOSIS — I080 Rheumatic disorders of both mitral and aortic valves: Secondary | ICD-10-CM | POA: Insufficient documentation

## 2008-10-07 DIAGNOSIS — I480 Paroxysmal atrial fibrillation: Secondary | ICD-10-CM | POA: Insufficient documentation

## 2008-10-07 DIAGNOSIS — I4891 Unspecified atrial fibrillation: Secondary | ICD-10-CM | POA: Insufficient documentation

## 2008-10-09 ENCOUNTER — Encounter (HOSPITAL_COMMUNITY): Admission: RE | Admit: 2008-10-09 | Discharge: 2009-01-07 | Payer: Self-pay | Admitting: Cardiology

## 2008-10-29 ENCOUNTER — Ambulatory Visit: Payer: Self-pay | Admitting: Cardiology

## 2008-11-17 ENCOUNTER — Encounter: Payer: Self-pay | Admitting: Cardiology

## 2008-12-08 ENCOUNTER — Ambulatory Visit: Payer: Self-pay | Admitting: Cardiology

## 2008-12-08 DIAGNOSIS — I739 Peripheral vascular disease, unspecified: Secondary | ICD-10-CM | POA: Insufficient documentation

## 2008-12-16 ENCOUNTER — Encounter: Payer: Self-pay | Admitting: Cardiology

## 2008-12-29 ENCOUNTER — Ambulatory Visit: Payer: Self-pay | Admitting: Thoracic Surgery (Cardiothoracic Vascular Surgery)

## 2009-01-08 ENCOUNTER — Telehealth: Payer: Self-pay | Admitting: Cardiology

## 2009-01-09 ENCOUNTER — Encounter: Payer: Self-pay | Admitting: Cardiology

## 2009-01-29 ENCOUNTER — Encounter (INDEPENDENT_AMBULATORY_CARE_PROVIDER_SITE_OTHER): Payer: Self-pay | Admitting: *Deleted

## 2009-04-20 ENCOUNTER — Encounter: Payer: Self-pay | Admitting: Cardiology

## 2009-04-21 ENCOUNTER — Ambulatory Visit: Payer: Self-pay | Admitting: Cardiology

## 2009-08-19 ENCOUNTER — Ambulatory Visit: Payer: Self-pay

## 2009-08-19 ENCOUNTER — Encounter: Payer: Self-pay | Admitting: Cardiology

## 2010-01-01 ENCOUNTER — Encounter
Admission: RE | Admit: 2010-01-01 | Discharge: 2010-01-01 | Payer: Self-pay | Admitting: Thoracic Surgery (Cardiothoracic Vascular Surgery)

## 2010-01-01 ENCOUNTER — Ambulatory Visit: Payer: Self-pay | Admitting: Thoracic Surgery (Cardiothoracic Vascular Surgery)

## 2010-01-01 IMAGING — CT CT CHEST W/O CM
4 of 6 series · 18 of 36 positions shown, 19 images · non-contrast
Comparison: Chest x-ray [DATE]

CLINICAL DATA: Sternal discomfort.  Question sternal fracture or
nonunion.No acute findings in the chest.

CT CHEST WITHOUT CONTRAST
TECHNIQUE: Multidetector CT imaging of the chest was performed
following the standard protocol without IV contrast.

[Series 3: routine chest · axial · 0.62mm/px · z∈[-320,-150]mm · 4 of 58 slices shown, 5 images]
[im 12/58  mediastinal]
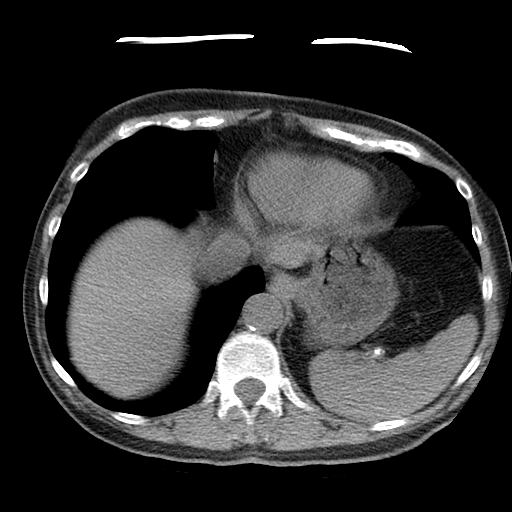
[im 12/58  lung]
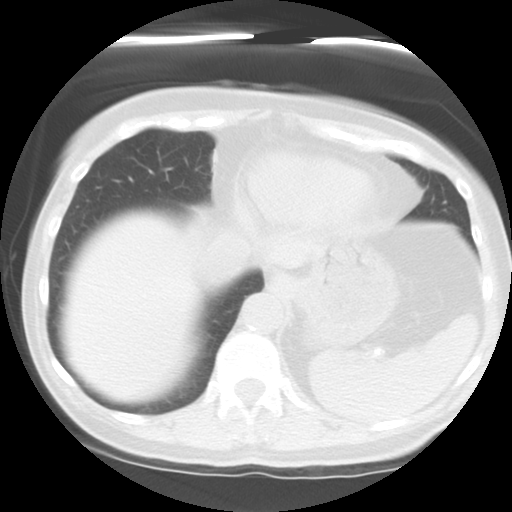
[im 23/58  lung]
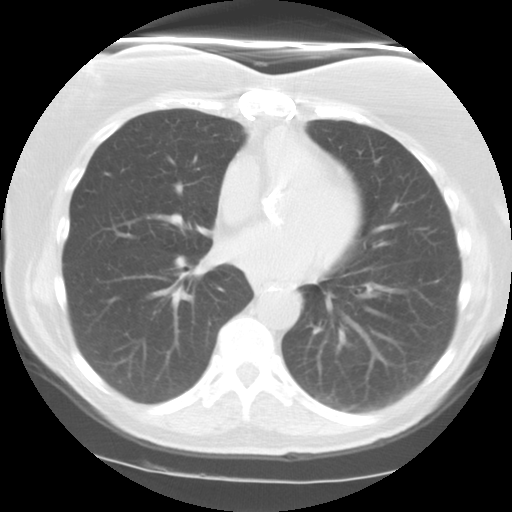
[im 35/58  lung]
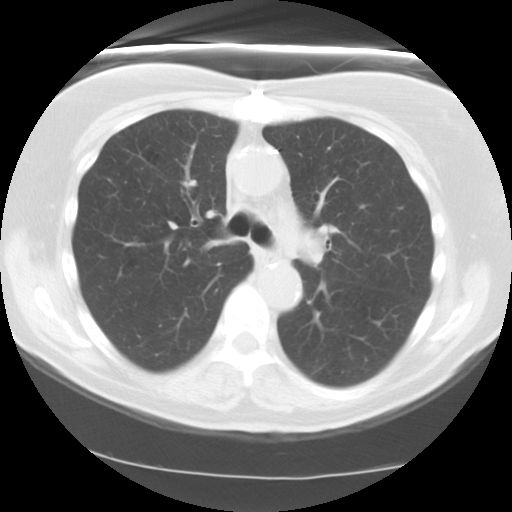
[im 46/58  lung]
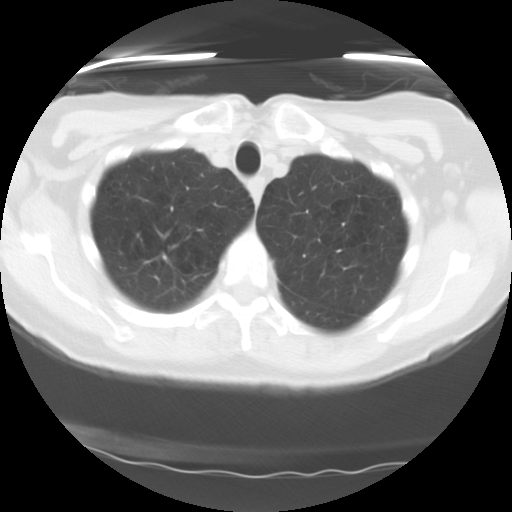

[Series 100: thin chest · axial · 0.35mm/px · z∈[-288,-165]mm · 6 of 69 slices shown]
[im 10/69  lung]
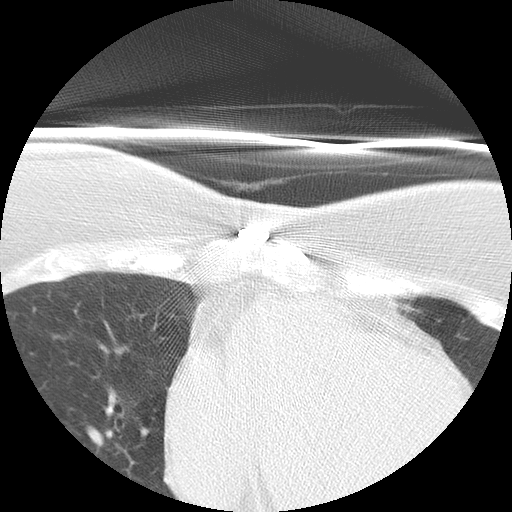
[im 20/69  lung]
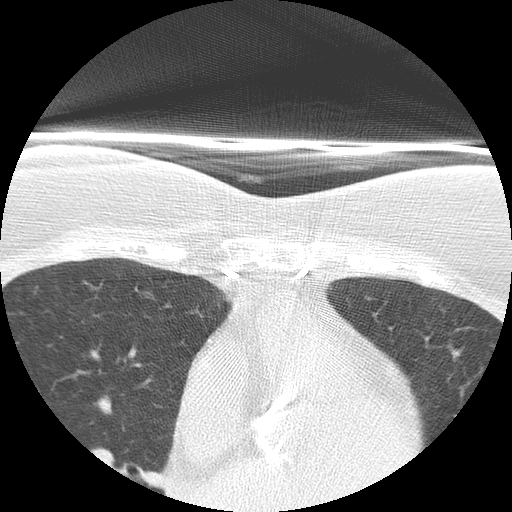
[im 30/69  lung]
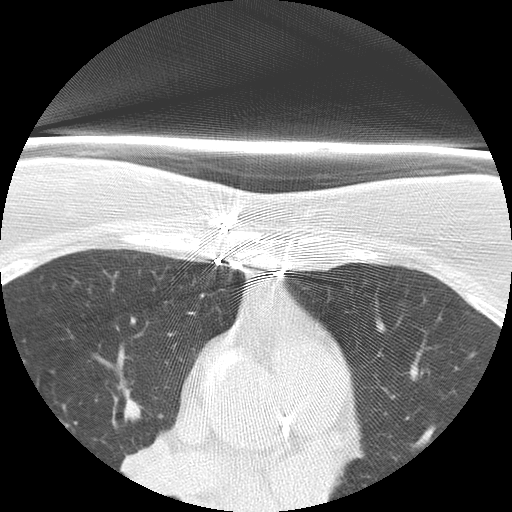
[im 39/69  lung]
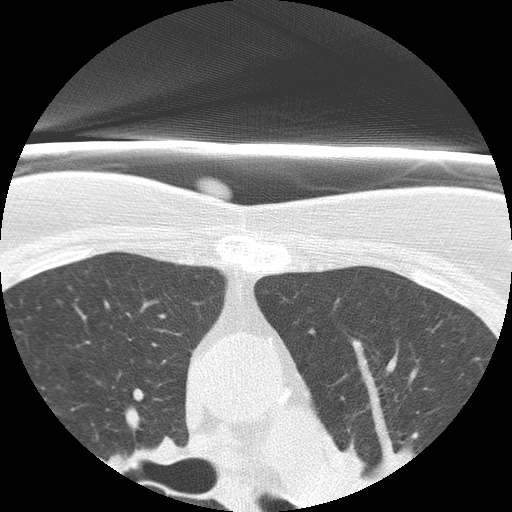
[im 49/69  lung]
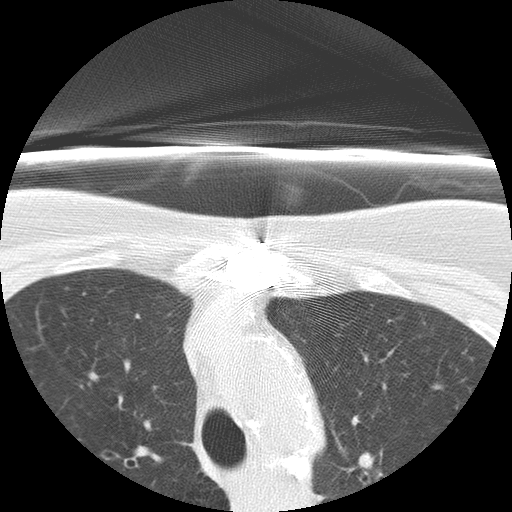
[im 59/69  lung]
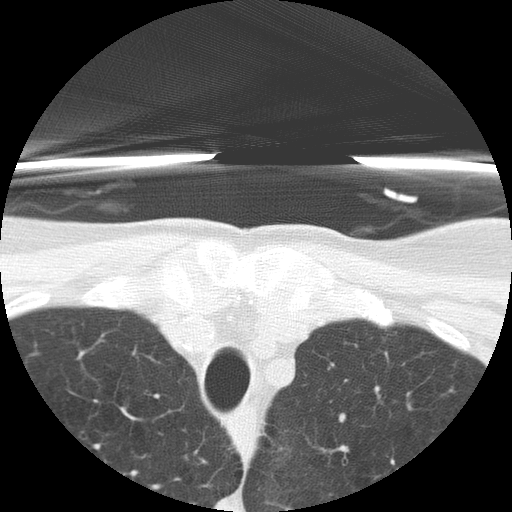

[Series 101: cor sternum · coronal · 0.35mm/px · 3 of 26 slices shown]
[im 6/26  lung]
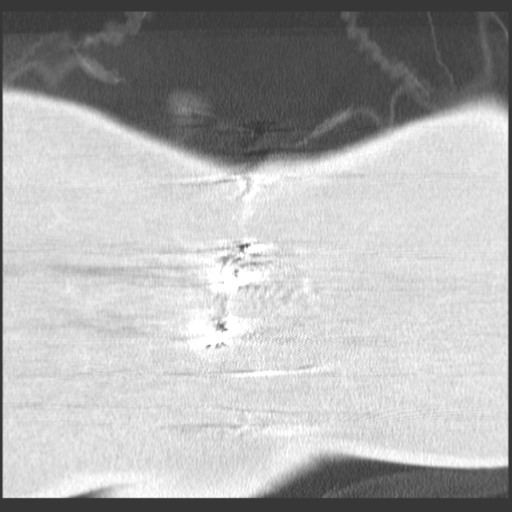
[im 11/26  lung]
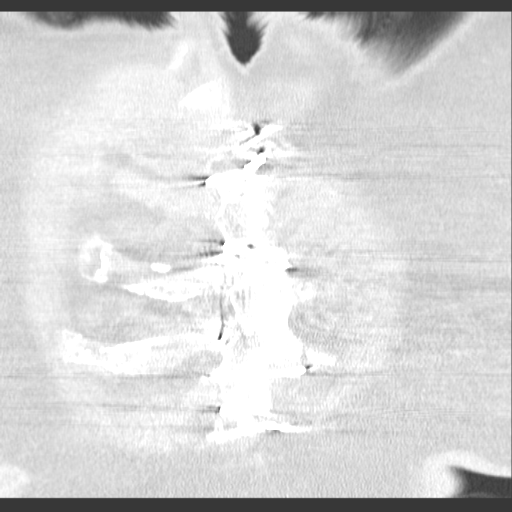
[im 16/26  lung]
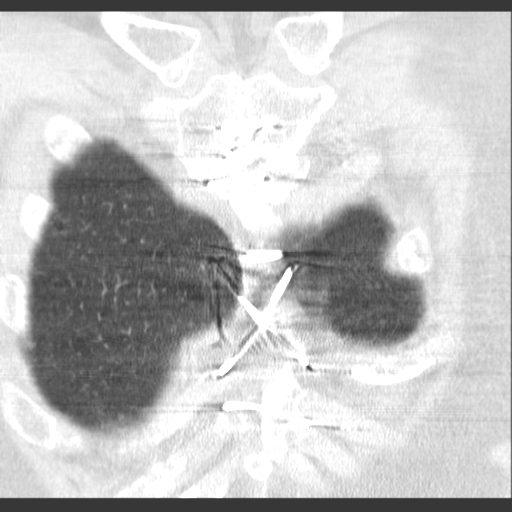

[Series 602: sagittal body · sagittal · 0.62mm/px · 5 of 129 slices shown]
[im 10/129  mediastinal]
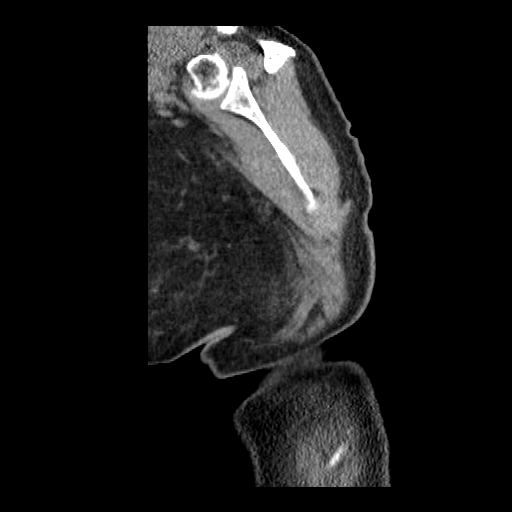
[im 30/129  mediastinal]
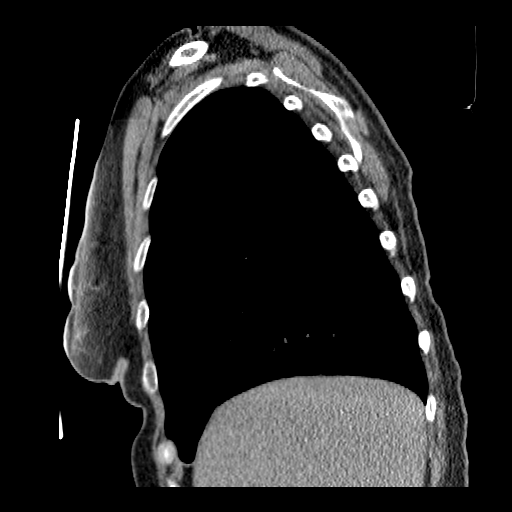
[im 40/129  mediastinal]
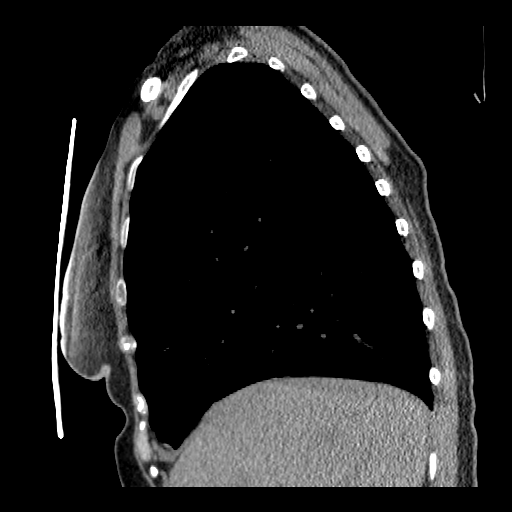
[im 60/129  mediastinal]
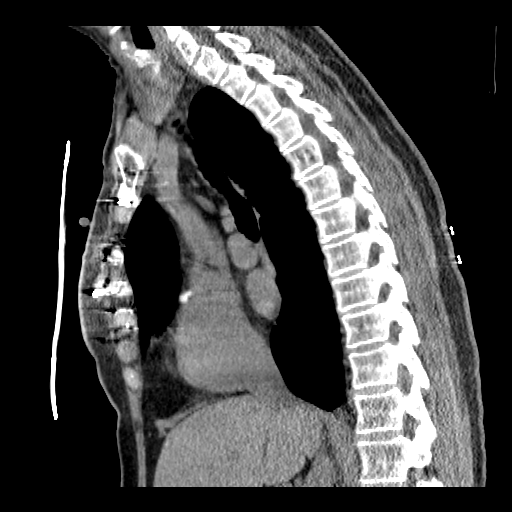
[im 69/129  mediastinal]
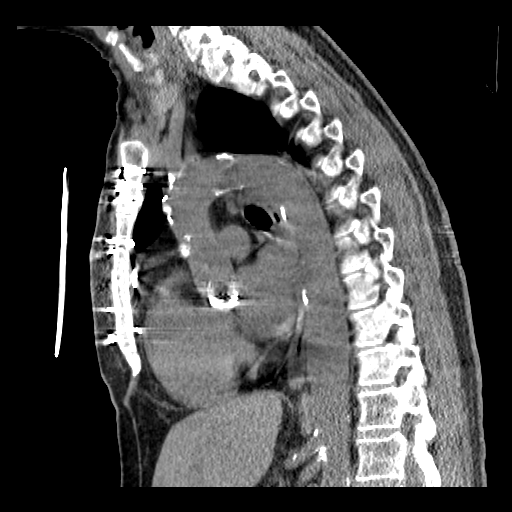

[18 of 36 positions shown; findings below may reference images not displayed]

FINDINGS: The patient is status post median sternotomy, CABG and
valve replacement.  No acute bony abnormality within the sternum.
There is evidence of healing of the sternal fracture from prior
median sternotomy.  No definite acute bony abnormality or
explanation for the patient's sternal pain.  No fluid collections
overlying or deep to the sternum.

Heart is upper limits normal in size.  Aorta is normal caliber. No
mediastinal, hilar, or axillary adenopathy.  Visualized thyroid and
chest wall soft tissues unremarkable.

Mild COPD changes are present in the lungs.  No focal opacities or
effusions.

Imaging into the upper abdomen shows no acute findings.
IMPRESSION: Evidence of prior median sternotomy.  No acute bony abnormality
within the sternum to explain sternal discomfort.

## 2010-01-11 ENCOUNTER — Ambulatory Visit: Payer: Self-pay | Admitting: Thoracic Surgery (Cardiothoracic Vascular Surgery)

## 2010-01-14 IMAGING — CR DG CHEST 2V
2 series · 2 of 2 positions shown · non-contrast
Comparison: [DATE]

CLINICAL DATA: Sternal pain.  Pre admission. 30 pack year history
of smoking with cessation 1 year ago.  Hypertension.  No current
chest complaints

CHEST - 2 VIEW

[view not recorded (1 of 2)]
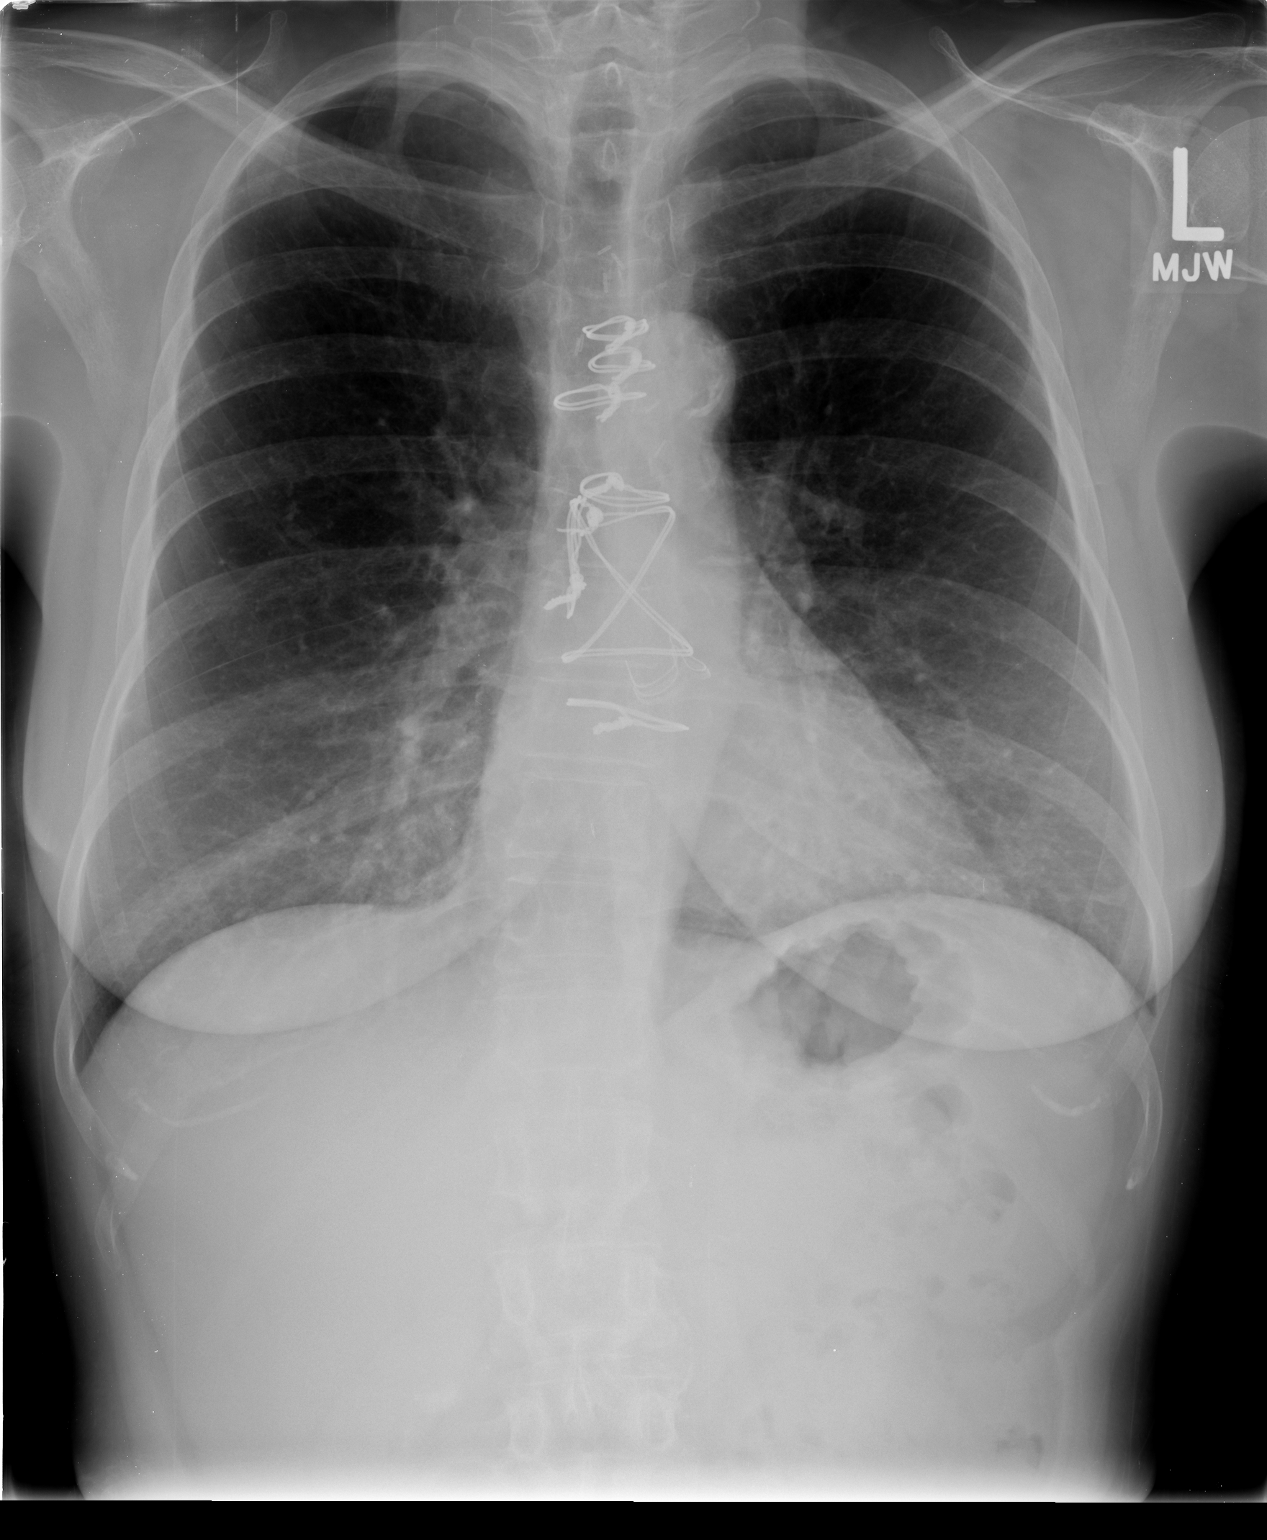

[view not recorded (2 of 2)]
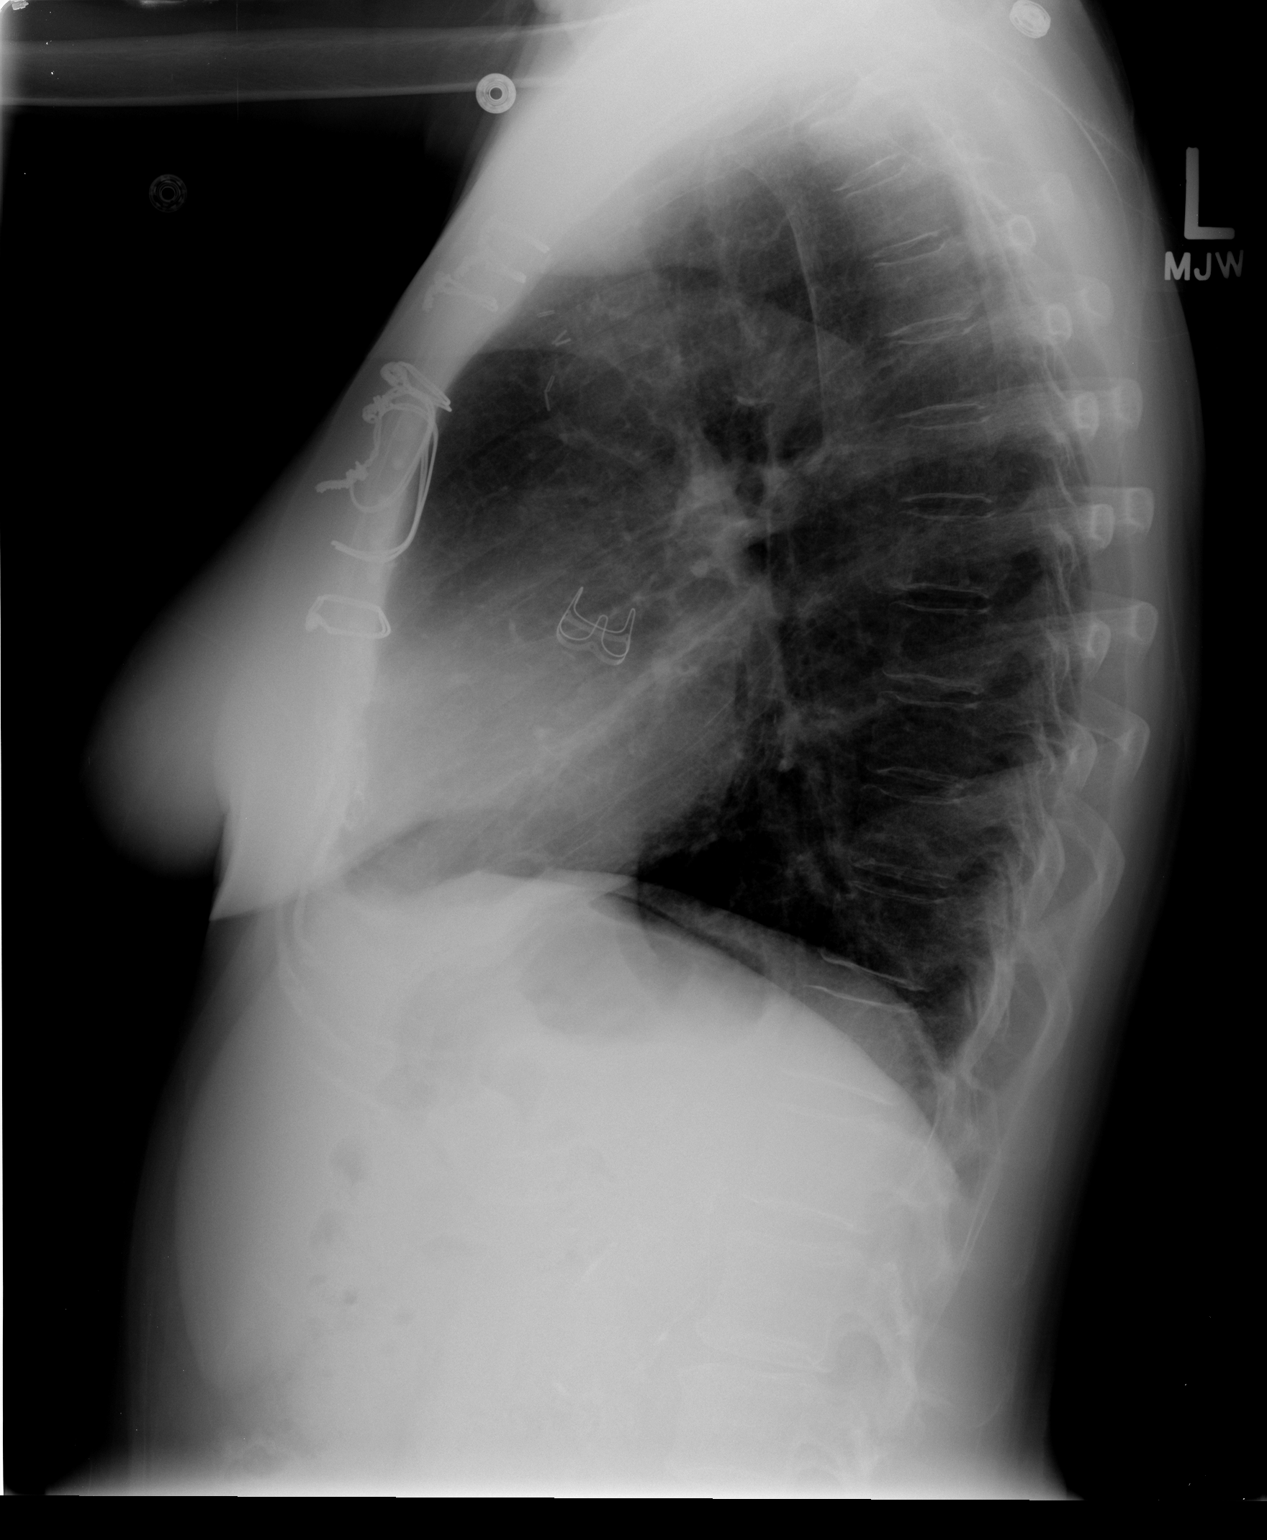

[2 of 2 positions shown; findings below may reference images not displayed]

FINDINGS: The patient is status post median sternotomy and aortic
valve replacement. Sternotomy wires appear intact in this patient
with history of sternal pain. Heart and mediastinal contours are
stable with calcification again seen in the aortic arch.

The lung fields are clear with no signs of focal infiltrate or
congestive failure.  No pleural fluid or peribronchial cuffing is
noted.

A small calcification is noted in the descending intra-abdominal
portion of the aorta as well.
IMPRESSION: Stable cardiopulmonary appearance with no new acute or focal
cardiopulmonary abnormality noted.

## 2010-01-15 ENCOUNTER — Ambulatory Visit: Payer: Self-pay | Admitting: Thoracic Surgery (Cardiothoracic Vascular Surgery)

## 2010-01-15 ENCOUNTER — Encounter: Payer: Self-pay | Admitting: Thoracic Surgery (Cardiothoracic Vascular Surgery)

## 2010-01-15 ENCOUNTER — Observation Stay (HOSPITAL_COMMUNITY)
Admission: RE | Admit: 2010-01-15 | Discharge: 2010-01-16 | Payer: Self-pay | Admitting: Thoracic Surgery (Cardiothoracic Vascular Surgery)

## 2010-02-01 ENCOUNTER — Ambulatory Visit: Payer: Self-pay | Admitting: Thoracic Surgery (Cardiothoracic Vascular Surgery)

## 2010-03-16 ENCOUNTER — Encounter: Payer: Self-pay | Admitting: Cardiology

## 2010-04-27 ENCOUNTER — Encounter: Payer: Self-pay | Admitting: Cardiology

## 2010-04-28 ENCOUNTER — Ambulatory Visit: Payer: Self-pay | Admitting: Cardiology

## 2010-04-28 DIAGNOSIS — I714 Abdominal aortic aneurysm, without rupture, unspecified: Secondary | ICD-10-CM | POA: Insufficient documentation

## 2010-04-28 DIAGNOSIS — I6529 Occlusion and stenosis of unspecified carotid artery: Secondary | ICD-10-CM | POA: Insufficient documentation

## 2010-05-20 ENCOUNTER — Encounter: Payer: Self-pay | Admitting: Cardiology

## 2010-05-20 ENCOUNTER — Ambulatory Visit: Payer: Self-pay

## 2010-07-15 NOTE — Assessment & Plan Note (Signed)
Summary: 1 YR ROV AS  PFH,RN  Medications Added FENOFIBRATE 160 MG TABS (FENOFIBRATE) 1 by mouth daily LISINOPRIL 20 MG TABS (LISINOPRIL) 1 by mouth daily      Allergies Added:   Visit Type:  Follow-up Primary Provider:  Dr Dagmar Hait  CC:  AVR.  History of Present Illness: The patient presents for followup of aortic valve replacement. In August she had to have revision of her sternum because of broken sternal wires. She otherwise has had no complications related to her valve replacement. She remains active and walks for exercise and works part-time. With this she denies any chest discomfort, neck or arm discomfort. She has no shortness of breath and denies any PND or orthopnea. She has no palpitations, presyncope or syncope. She has had no fevers or chills. She denies any weight gain or edema.  Current Medications (verified): 1)  Metformin Hcl 500 Mg Tabs (Metformin Hcl) .... 2 Tabs Two Times A Day 2)  Fenofibrate 160 Mg Tabs (Fenofibrate) .Marland Kitchen.. 1 By Mouth Daily 3)  Aspirin 81 Mg  Tbec (Aspirin) .... One By Mouth Every Day 4)  Multivitamins   Tabs (Multiple Vitamin) .Marland Kitchen.. 1 Tab Once Daily 5)  Lisinopril 20 Mg Tabs (Lisinopril) .Marland Kitchen.. 1 By Mouth Daily 6)  Pravastatin Sodium 40 Mg Tabs (Pravastatin Sodium) .... One By Mouth Qhs 7)  Alprazolam 0.25 Mg Tabs (Alprazolam) .... One As Needed Every 4 Hours  Allergies (verified): 1)  ! Codeine 2)  ! Sulfa 3)  ! Pcn  Past History:  Past Medical History: Hypertension(mild essential) Hypercholestrolemia Aortic Stenosis Post operative atrial fibrillation Carotid artery stenosis Abdominal aortic aneurysm (3 x 3 cm)  Past Surgical History: Abdominal Hysterectomy-Total Aortic valve replacement with 19-mm Edwards Magna Ease pericardial tissue valve. (08/14/2008 Dr. Roxy Manns) Revision of sternal wires  Review of Systems       As stated in the HPI and negative for all other systems.   Vital Signs:  Patient profile:   65 year old female Height:       65 inches Weight:      127 pounds BMI:     21.21 Pulse rate:   75 / minute Resp:     16 per minute BP sitting:   122 / 78  (right arm)  Vitals Entered By: Levora Angel, CNA (April 28, 2010 3:20 PM)  Physical Exam  General:  Well developed, well nourished, in no acute distress. Head:  normocephalic and atraumatic Eyes:  PERRLA/EOM intact; conjunctiva and lids normal. Mouth:  edentulous, gums and palate normal. Oral mucosa normal. Neck:  Neck supple, no JVD. No masses, thyromegaly or abnormal cervical nodes. Chest Wall:  well-healed sternotomy scar Lungs:  Clear bilaterally to auscultation and percussion.   Detailed Cardiovascular Exam  Neck    Carotids: Carotids full and equal bilaterally left greater than right bruit    Neck Veins: Normal, no JVD.    Heart    Inspection: no deformities or lifts noted.      Palpation: normal PMI with no thrills palpable.      Auscultation: regular rate and rhythm, S1, S2,  2/6 apical systolic murmur heard best at the right upper sternal border  Vascular    Abdominal Aorta: no palpable masses, pulsations, or audible bruits.      Femoral Pulses: normal femoral pulses bilaterally.      Pedal Pulses: pulses normal in all 4 extremities    Radial Pulses: normal radial pulses bilaterally.      Peripheral Circulation: no  clubbing, cyanosis, or edema noted with normal capillary refill.     EKG  Procedure date:  04/28/2010  Findings:      Sinus rhythm, rate 75, axis within normal limits, intervals within normal limits, nonspecific lateral T-wave changes  Impression & Recommendations:  Problem # 1:  AORTIC VALVE REPLACEMENT, HX OF (ICD-V43.3) She has no apparent complications related to this and I suspect that they'll be working normally. No further imaging is indicated.  Problem # 2:  CAROTID STENOSIS (ICD-433.10) She is overdue for followup carotid Dopplers and I will arrange this. Orders: Carotid Duplex (Carotid Duplex)  Problem #  3:  ABDOMINAL AORTIC ANEURYSM (ICD-441.4) She does have a small abdominal aortic aneurysm and I will repeat an ultrasound when I see her in one year.  Problem # 4:  FIBRILLATION, ATRIAL (ICD-427.31) She has had no further arrhythmias since her surgery. No change in therapy is indicated. No further evaluation is indicated. Orders: EKG w/ Interpretation (93000)  Patient Instructions: 1)  Your physician recommends that you schedule a follow-up appointment in: 1year with Dr. Percival Spanish ALSO ABD ULTRASOUND IN 1 YEAR SAME DAY. 2)  Your physician recommends that you continue on your current medications as directed. Please refer to the Current Medication list given to you today. 3)  Your physician has requested that you have a carotid duplex. This test is an ultrasound of the carotid arteries in your neck. It looks at blood flow through these arteries that supply the brain with blood. Allow one hour for this exam. There are no restrictions or special instructions.  Appended Document: 1 YR ROV AS  PFH,RN    Clinical Lists Changes  Orders: Added new Test order of Abdominal Aorta Duplex (Abd Aorta Duplex) - Signed

## 2010-07-15 NOTE — Assessment & Plan Note (Signed)
Summary: 4 months 443.9 424.0  pfh,rn  Medications Added METFORMIN HCL 500 MG TABS (METFORMIN HCL) 2 tabs two times a day      Allergies Added:   Visit Type:  Follow-up Primary Provider:  Dr Dagmar Hait  CC:  Aortic Stenosis.  History of Present Illness: The patient presents for followup of aortic valve disease status post valve replacement. She does quite well now. She is back to work though she doesn't exercise routinely. She says in retrospect she knows she feels better with more energy and less breathlessness. She's not having chest pressure other than some mild sternal discomfort. She's had no PND or orthopnea. She's had no palpitations, presyncope or syncope. She is still not smoking cigarettes.  Current Medications (verified): 1)  Metformin Hcl 500 Mg Tabs (Metformin Hcl) .... 2 Tabs Two Times A Day 2)  Trilipix 135 Mg Cpdr (Choline Fenofibrate) .Marland Kitchen.. 1 Tab Once Daily 3)  Aspirin 81 Mg  Tbec (Aspirin) .... One By Mouth Every Day 4)  Multivitamins   Tabs (Multiple Vitamin) .Marland Kitchen.. 1 Tab Once Daily 5)  Lisinopril 10 Mg Tabs (Lisinopril) .... Take One Tablet By Mouth Daily 6)  Pravastatin Sodium 40 Mg Tabs (Pravastatin Sodium) .... One By Mouth Qhs  Allergies (verified): 1)  ! Codeine 2)  ! Sulfa 3)  ! Pcn  Past History:  Past Medical History: Reviewed history from 10/07/2008 and no changes required. Hypertension(mild essential) Hypercholestrolemia Aortic Stenosis Post operative atrial fibrillation  Past Surgical History: Reviewed history from 10/07/2008 and no changes required. Abdominal Hysterectomy-Total Aortic valve replacement with 19-mm Edwards Magna Ease pericardial tissue valve. (08/14/2008 Dr. Roxy Manns)  Review of Systems       As stated in the HPI and negative for all other systems.   Vital Signs:  Patient profile:   65 year old female Height:      65 inches Weight:      142 pounds BMI:     23.72 Pulse rate:   90 / minute BP sitting:   132 / 66  (right arm) Cuff  size:   regular  Vitals Entered By: Mignon Pine, RMA (April 21, 2009 4:07 PM)  Physical Exam  General:  Well developed, well nourished, in no acute distress. Head:  normocephalic and atraumatic Eyes:  PERRLA/EOM intact; conjunctiva and lids normal. Mouth:  edentulous, gums and palate normal. Oral mucosa normal. Neck:  Neck supple, no JVD. No masses, thyromegaly or abnormal cervical nodes. Chest Wall:  well-healed sternotomy scar Lungs:  Clear bilaterally to auscultation and percussion. Heart:  S1 and S2 within normal limits, no S3, no S4, no clicks, no rubs, 2/6 apical systolic murmur radiating slightly at the aortic outflow tract, no diastolic murmurs. Abdomen:  Bowel sounds positive; abdomen soft and non-tender without masses, organomegaly, or hernias noted. No hepatosplenomegaly. Msk:  Back normal, normal gait. Muscle strength and tone normal. Pulses:  pulses normal in all 4 extremities Extremities:  No clubbing or cyanosis. Neurologic:  Alert and oriented x 3. Skin:  Intact without lesions or rashes. Psych:  Normal affect.   EKG  Procedure date:  04/21/2009  Findings:      sinus rhythm, rate 90, axis within normal limits, intervals within normal limits, no acute ST-T wave changes, nonspecific lateral T wave flattening  Impression & Recommendations:  Problem # 1:  AORTIC VALVE REPLACEMENT, HX OF (ICD-V43.3) The patient is doing well.  She will continue with current therapy.  No further testing is indicated.  Problem # 2:  HYPERTENSION, MILD (ICD-401.1) Her blood pressure is well controlled.  She will continue on the meds as listed. Orders: EKG w/ Interpretation (93000)  Patient Instructions: 1)  Your physician recommends that you schedule a follow-up appointment in: 45 MONTHS 2)  Your physician recommends that you continue on your current medications as directed. Please refer to the Current Medication list given to you today.  Prevention & Chronic  Care Immunizations   Influenza vaccine: Not documented    Tetanus booster: Not documented    Pneumococcal vaccine: Not documented    H. zoster vaccine: Not documented  Colorectal Screening   Hemoccult: Not documented    Colonoscopy: Not documented  Other Screening   Pap smear: Not documented    Mammogram: Not documented    DXA bone density scan: Not documented   Smoking status: current  (06/23/2008)  Lipids   Total Cholesterol: Not documented   LDL: Not documented   LDL Direct: Not documented   HDL: Not documented   Triglycerides: Not documented    SGOT (AST): Not documented   SGPT (ALT): Not documented   Alkaline phosphatase: Not documented   Total bilirubin: Not documented  Hypertension   Last Blood Pressure: 132 / 66  (04/21/2009)   Serum creatinine: 1.1  (06/25/2008)   Serum potassium 4.0  (06/25/2008)  Self-Management Support :    Hypertension self-management support: Not documented    Lipid self-management support: Not documented

## 2010-07-15 NOTE — Miscellaneous (Signed)
  Clinical Lists Changes  Observations: Added new observation of ABDOM US: Stable dimensions of infrarenal hour glass shaped AAA, at 3.0 cm X 3.1 cm Small caliber common and external iliac arteries with moderate stenosis  f/u 1 year (08/19/2009 8:30)      [place] of Abdomen  Procedure date:  08/19/2009  Findings:      Stable dimensions of infrarenal hour glass shaped AAA, at 3.0 cm X 3.1 cm Small caliber common and external iliac arteries with moderate stenosis  f/u 1 year

## 2010-07-15 NOTE — Miscellaneous (Signed)
Summary: [facility]  [facility]   Imported By: Sallee Provencal 01/23/2009 15:04:51  _____________________________________________________________________  External Attachment:    Type:   Image     Comment:   External Document

## 2010-07-15 NOTE — Letter (Signed)
Summary: Appointment - [facility] [place], [place]  [address] [place]   [place], [place] [address]   Phone: [phone]  Fax: [phone]     January 29, 2009 MRN: [id number]   Sandra Sheppard [place], [place]  [address]   Dear [name],  [facility] records indicate that it is time to schedule a follow-up appointment.  Dr.Hochrein  recommended that you follow up with [place] in Oct 2010 . It is very important that we reach you to schedule this appointment. We look forward to participating in your health care needs. Please contact us at the number listed above at your earliest convenience to schedule your appointment.  If you are unable to make an appointment at this time, give [place] a call so we can update our records.     Sincerely,    [place] Scheduling Team

## 2010-07-15 NOTE — Assessment & Plan Note (Signed)
Summary: 2 MO F/U  Medications Added PRAVASTATIN SODIUM 40 MG TABS (PRAVASTATIN SODIUM) one by mouth qhs      Allergies Added:   Visit Type:  Follow-up Primary Armonie Staten:  Dr Dagmar Hait  CC:  Aortic Valve Replacement .  History of Present Illness: The patient presents for evaluation of aortic stenosis status post valve replacement. Since I last saw her she has improved. She was complaining of quite a bit of sternal discomfort. This is still there. She is tender with palpation and certain movements but it is improved. She is doing cardiac rehabilitation. We did see her back once because her blood pressure was elevated and we restarted lisinopril. Her blood pressure has been well controlled with this. She is having no new cardiovascular complaints. She is not describing shortness of breath, PND or orthopnea. There have been no palpitations, presyncope or syncope. There have been no fevers or chills.  Current Medications (verified): 1)  Metformin Hcl 500 Mg Tabs (Metformin Hcl) .Marland Kitchen.. 1 Tab Bid 2)  Trilipix 135 Mg Cpdr (Choline Fenofibrate) .Marland Kitchen.. 1 Tab Once Daily 3)  Aspirin 81 Mg  Tbec (Aspirin) .... One By Mouth Every Day 4)  Multivitamins   Tabs (Multiple Vitamin) .Marland Kitchen.. 1 Tab Once Daily 5)  Lisinopril 10 Mg Tabs (Lisinopril) .... Take One Tablet By Mouth Daily  Allergies (verified): 1)  ! Codeine 2)  ! Sulfa 3)  ! Pcn  Past History:  Past Medical History: Reviewed history from 10/07/2008 and no changes required. Hypertension(mild essential) Hypercholestrolemia Aortic Stenosis Post operative atrial fibrillation  Past Surgical History: Reviewed history from 10/07/2008 and no changes required. Abdominal Hysterectomy-Total Aortic valve replacement with 19-mm Edwards Magna Ease pericardial tissue valve. (08/14/2008 Dr. Roxy Manns)  Review of Systems       As stated in the HPI and negative for all other systems.   Vital Signs:  Patient profile:   65 year old female Height:      65  inches Weight:      143 pounds BMI:     23.88 Pulse rate:   77 / minute Resp:     16 per minute BP sitting:   132 / 78  (left arm)  Vitals Entered By: Levora Angel, CNA (December 08, 2008 10:37 AM)  Physical Exam  General:  Well developed, well nourished, in no acute distress. Head:  normocephalic and atraumatic Mouth:  Edentulous gums and palate normal. Oral mucosa normal. Neck:  Neck supple, no JVD. No masses, thyromegaly or abnormal cervical nodes. Chest Wall:  Well healed sternotomy scar without erythema, exudate or sternal mobility. Lungs:  Clear bilaterally to auscultation and percussion. Abdomen:  Bowel sounds positive; abdomen soft and non-tender without masses, organomegaly, or hernias noted. No hepatosplenomegaly. Msk:  Back normal, normal gait. Muscle strength and tone normal. Pulses:  pulses normal in all 4 extremities Extremities:  No clubbing or cyanosis. Neurologic:  Alert and oriented x 3. Skin:  Intact without lesions or rashes. Cervical Nodes:  no significant adenopathy Axillary Nodes:  no significant adenopathy Inguinal Nodes:  no significant adenopathy Psych:  Normal affect.   Detailed Cardiovascular Exam  Neck    Carotids: Carotids full and equal bilaterally without bruits.      Neck Veins: Normal, no JVD.    Heart    Inspection: no deformities or lifts noted.      Palpation: normal PMI with no thrills palpable.      Auscultation: regular rate and rhythm, S1, S2 without murmurs, rubs, gallops, or clicks.  Vascular    Abdominal Aorta: no palpable masses, pulsations, or audible bruits.      Femoral Pulses: normal femoral pulses bilaterally.      Pedal Pulses: normal pedal pulses bilaterally.      Radial Pulses: normal radial pulses bilaterally.      Peripheral Circulation: no clubbing, cyanosis, or edema noted with normal capillary refill.     EKG  Procedure date:  12/08/2008  Findings:      Normal sinus rhythm, rate 76, axis within normal limits,  intervals within normal limits, nonspecific ST depression.  Impression & Recommendations:  Problem # 1:  AORTIC VALVE REPLACEMENT, HX OF (ICD-V43.3) She he is now having some improvement in her sternal discomfort. She will see Dr. Roxy Manns back soon and may be released for work. She will continue with cardiac rehabilitation.  Problem # 2:  FIBRILLATION, ATRIAL (ICD-427.31) She has had no recurrence of this. No further evaluation is planned. Orders: EKG w/ Interpretation (93000)  Problem # 3:  HYPERCHOLESTEROLEMIA  IIA (ICD-272.0) I have restarted his statin given her peripheral vascular disease. I will start pravastatin and will ask Dr.Avva to check a lipid profile when he sees her back in September.  Problem # 4:  HYPERTENSION, MILD (ICD-401.1) Her blood pressure is controlled and she will continue the meds as listed.  Problem # 5:  PERIPHERAL VASCULAR DISEASE (ICD-443.9) She has mild carotid and lower extremity plaque and a small abdominal aortic aneurysm. She is scheduled to have routine followup of these and we reviewed this. This is not due again until the spring of next year. Because of this disease we will be aggressive with risk reduction.  Patient Instructions: 1)  Your physician recommends that you schedule a follow-up appointment in: 20months 2)  Your physician has recommended you make the following change in your medication: at pravastatin 40mg  at bedtime Prescriptions: PRAVASTATIN SODIUM 40 MG TABS (PRAVASTATIN SODIUM) one by mouth qhs  #30 x 11   Entered by:   Sim Boast, RN   Authorized by:   Minus Breeding, MD, [facility]   Signed by:   Sim Boast, RN on 12/08/2008   Method used:   Electronically to        [facility]  [phone]* (retail)       [address]       [place], [place]  [address]       Ph: [phone] or [phone]       Fax: [phone]   RxID:   [id number]

## 2010-07-15 NOTE — Progress Notes (Signed)
Summary: pt heart is fluttering   Phone Note Call from Patient   Caller: Spouse/ Billy Reason for Call: Talk to Nurse, Talk to Doctor Summary of Call: patients heart is fluttering and they took vital signs b/p was 118/73 HR 89 what do they need to do Initial call taken by: Shelda Pal,  January 08, 2009 11:24 AM  Follow-up for Phone Call        Pt c/o a "weak" fluttering sensation in her chest for the past 2 days.  The episodes are off and on and last  5-62min.  Pt denies CP or SOB with these episodes.  Pt says she feels her heart rate increase when it is fluttering. The pt has had 4-5 episodes of fluttering today.  Pt denies palpitations at this time. The pt did have AFIB after her surgery.  Will review with Dr Percival Spanish for further recommendations. Follow-up by: Theodosia Quay, RN, BSN,  January 08, 2009 12:49 PM  Additional Follow-up for Phone Call Additional follow up Details #1::        Spoke with Dr Percival Spanish about pts fluttering in chest.  He gave verbal orders that she could have a monitor to see what rythum she is in.  Spoke with pt who states she doesn't feel like she needs a monitor at this time.  She denies any SOB or CP, no dizziness or any kind of symptoms.  She will let us know if this changes. Additional Follow-up by: Sim Boast, RN,  January 08, 2009 1:34 PM

## 2010-07-15 NOTE — Assessment & Plan Note (Signed)
Summary: rov/htn @ cardiac rehab  Medications Added ASPIRIN 81 MG  TBEC (ASPIRIN) one by mouth every day LISINOPRIL 10 MG TABS (LISINOPRIL) Take one tablet by mouth daily        Visit Type:  Follow-up Primary Provider:  Dr Dagmar Hait  CC:  htn.  History of Present Illness: This is a 65 year old white female patient, who recently underwent aortic valve replacement with a pericardial tissue valve in March. She has been in cardiac rehabilitation and her blood pressures have gone up recently. She had been on antihypertensive prior to surgery, but this was never resumed. Cardiac rehabilitation Center noticing her blood pressures were up with both rest and exercise. Today in the office at 142/80.  Overall, the patient's done well since her surgery. She denies any significant chest pain, palpitations, dizziness, or presyncope. She denies shortness of breath. She does have a lot of chest wall soreness and numbness in her chest.  Current Medications (verified): 1)  Metformin Hcl 500 Mg Tabs (Metformin Hcl) .Marland Kitchen.. 1 Tab Bid 2)  Trilipix 135 Mg Cpdr (Choline Fenofibrate) .Marland Kitchen.. 1 Tab Once Daily 3)  Aspirin 81 Mg  Tbec (Aspirin) .... One By Mouth Every Day 4)  Multivitamins   Tabs (Multiple Vitamin) .Marland Kitchen.. 1 Tab Once Daily  Allergies: 1)  ! Codeine 2)  ! Sulfa 3)  ! Pcn  Past History:  Past Medical History:    Hypertension(mild essential)    Hypercholestrolemia    Aortic Stenosis    Post operative atrial fibrillation     (10/07/2008)  Review of Systems       see the history of present illness  Vital Signs:  Patient profile:   65 year old female Height:      65 inches Weight:      135 pounds Pulse rate:   68 / minute Pulse rhythm:   regular BP sitting:   142 / 80  (left arm)  Vitals Entered By: Talbert Nan, CMA (Oct 29, 2008 12:31 PM)  Physical Exam  General:   Well-nournished, in no acute distress. Neck: No JVD, HJR,or thyroid enlargement.bilateral carotid bruits versus murmur  portrayed. Lungs: No tachypnea, clear without wheezing, rales, or rhonchi Cardiovascular: RRR, PMI not displaced, heart sounds normal,slight [id number] systolic murmur at the apex, no gallops, bruit, thrill, or heave. Abdomen: BS normal. Soft without organomegaly, masses, lesions or tenderness. Extremities: without cyanosis, clubbing or edema. Good distal pulses bilateral SKin: Warm, no lesions or rashes  Musculoskeletal: No deformities Neuro: no focal signs    Impression & Recommendations:  Problem # 1:  HYPERTENSION, MILD (ICD-401.1) patient's blood pressures been up recently since her medication was discontinued after surgery. I will resume lisinopril 10 mg daily.I also asked her to follow 2 g sodium diet. Her updated medication list for this problem includes:    Aspirin 81 Mg Tbec (Aspirin) ..... One by mouth every day    Lisinopril 10 Mg Tabs (Lisinopril) .Marland Kitchen... Take one tablet by mouth daily  Problem # 2:  AORTIC VALVE REPLACEMENT, HX OF (ICD-V43.3) patient is doing well, recovering from her recent aortic valve replacement.  Patient Instructions: 1)  Followup with Dr. Percival Spanish June 28, 10:15 2)  Your physician has recommended you make the following change in your medication: lisinopril 10 mg once daily. 3)  2 g sodium diet. Prescriptions: LISINOPRIL 10 MG TABS (LISINOPRIL) Take one tablet by mouth daily  #30 x 12   Entered by:   Thompson Grayer, RN, BSN   Authorized by:  Maretta Bees, PA-C   Signed by:   Thompson Grayer, RN, BSN on 10/29/2008   Method used:   Electronically to        [facility]  [phone]* (retail)       [address]       [place], [place]  [address]       Ph: [phone] or [phone]       Fax: [phone]   RxID:   [id number]

## 2010-07-15 NOTE — Letter (Signed)
Summary: [facility] Office Visit   [facility] Office Visit   Imported By: Sallee Provencal 03/16/2010 16:04:05  _____________________________________________________________________  External Attachment:    Type:   Image     Comment:   External Document

## 2010-07-15 NOTE — Miscellaneous (Signed)
Summary: [facility]  [facility]   Imported By: Phillis Knack 12/01/2008 08:47:43  _____________________________________________________________________  External Attachment:    Type:   Image     Comment:   External Document

## 2010-07-15 NOTE — Assessment & Plan Note (Signed)
Summary: eph per pt call/lg/ APPT IS 3:45/ GD  Medications Added AMIODARONE HCL 200 MG TABS (AMIODARONE HCL) Take one tablet by mouth daily METFORMIN HCL 500 MG TABS (METFORMIN HCL) 1 tab bid TRILIPIX 135 MG CPDR (CHOLINE FENOFIBRATE) 1 tab once daily ASPIRIN EC 325 MG TBEC (ASPIRIN) Take one tablet by mouth daily MULTIVITAMINS   TABS (MULTIPLE VITAMIN) 1 tab once daily      Allergies Added: ! CODEINE ! SULFA ! PCN  Visit Type:  Follow-up (eph) Primary Sandra Sheppard:  Dr Dagmar Hait  CC:  Status Post AVR.  History of Present Illness: The patient presents after aortic valve replacement. Unfortunately she has had significant sternal chest pain. However, she has not been taking any of her pain medications. She has not noticed any palpitations and has had no obvious recurrence of her atrial fibrillation. She has had no cough fevers or chills. She has had no shortness of breath and denies any PND or orthopnea. She did have a chest x-ray by Dr.Owen and reports that this was "okay". She is to see him again in July. She did see Dr.Avva and apparently had some iron deficiency.  Current Medications (verified): 1)  Amiodarone Hcl 200 Mg Tabs (Amiodarone Hcl) .... Take One Tablet By Mouth Daily 2)  Metformin Hcl 500 Mg Tabs (Metformin Hcl) .Marland Kitchen.. 1 Tab Bid 3)  Trilipix 135 Mg Cpdr (Choline Fenofibrate) .Marland Kitchen.. 1 Tab Once Daily 4)  Aspirin Ec 325 Mg Tbec (Aspirin) .... Take One Tablet By Mouth Daily 5)  Multivitamins   Tabs (Multiple Vitamin) .Marland Kitchen.. 1 Tab Once Daily  Allergies (verified): 1)  ! Codeine 2)  ! Sulfa 3)  ! Pcn  Past History:  Past Medical History:    Hypertension(mild essential)    Hypercholestrolemia    Aortic Stenosis    Post operative atrial fibrillation  Past Surgical History:    Abdominal Hysterectomy-Total    Aortic valve replacement with 19-mm Edwards Magna    Ease pericardial tissue valve. (08/14/2008 Dr. Roxy Manns)  Review of Systems       As stated in the HPI and negative for all  other systems.   Vital Signs:  Patient profile:   65 year old female Height:      65 inches Weight:      135 pounds BMI:     22.55 Pulse rate:   66 / minute BP sitting:   129 / 69  (left arm)  Vitals Entered By: Lubertha Basque, CMA (October 07, 2008 3:41 PM)  Physical Exam  General:  Well developed, well nourished, in no acute distress. Head:  normocephalic and atraumatic Eyes:  PERRLA/EOM intact; conjunctiva and lids normal. Mouth:  edentulousgums and palate normal. Oral mucosa normal. Neck:  Neck supple, no JVD. No masses, thyromegaly or abnormal cervical nodes. Chest Wall:  well healed sternotomy scar without erythema, exudate or sternal mobility. Lungs:  Clear bilaterally to auscultation and percussion. Heart:  S1 and S2 within normal limits, no S3, no S4, no clicks, no rubs, 2/6 apical systolic murmur radiating slightly the aortic outflow tract Abdomen:  Bowel sounds positive; abdomen soft and non-tender without masses, organomegaly, or hernias noted. No hepatosplenomegaly. Msk:  Back normal, normal gait. Muscle strength and tone normal. Extremities:  No clubbing or cyanosis. Neurologic:  Alert and oriented x 3. Skin:  Intact without lesions or rashes. Psych:  Normal affect.   EKG  Procedure date:  10/07/2008  Findings:      sinus rhythm, rate 68, nonspecific T-wave flattening, no  acute ST-T wave changes.  Impression & Recommendations:  Problem # 1:  AORTIC VALVE REPLACEMENT, HX OF (ICD-V43.3) I encourage the patient to take her pain medications. She is otherwise doing well and I will make no change in her regimen.  Problem # 2:  FIBRILLATION, ATRIAL (ICD-427.31) She will come off of the amiodarone when she runs out of the current dose.  Problem # 3:  HYPERCHOLESTEROLEMIA  IIA (ICD-272.0) She is currently not taking the Crestor which he was on before.  I will not restart this medication until she is over the chest soreness from her surgery. I don't want to "muddy  water" with any medication that might potentially cause muscle aches.  Other Orders: EKG w/ Interpretation (93000)  Patient Instructions: 1)  I will see her back in 2 months.

## 2010-07-15 NOTE — Miscellaneous (Signed)
Clinical Lists Changes  Observations: Added new observation of ECHOINTERP:  -  Views were limited by surgical dressings.   -  The left ventricle was small. Overall left ventricular systolic         function was vigorous. Left ventricular ejection fraction was         estimated , range being 60 % to 65 %. There was no diagnostic         evidence of left ventricular regional wall motion         abnormalities. Left ventricular wall thickness was mildly         increased. Features were consistent with mild diastolic         dysfunction. There was moderate flattening of the         interventricular septum.   -  There was a bioprosthetic aortic valve. No obvious perivalvular         leak. There was no significant aortic valvular regurgitation.         The mean transaortic valve gradient was 16 mmHg. The peak         transaortic valve gradient was 36 mmHg.   -  There was mild mitral annular calcification. There was mild         mitral valvular regurgitation.   -  Left atrial size was at the upper limits of normal.   -  Right ventricular systolic function was mildly reduced.   -  There appeared to be a small pericardial effusion. (08/16/2008 10:18) Added new observation of DOPPLER LEG:   Right palmar arch diminishes with radial compression, normal with         ulnar compression.Left palmar arch normal with radial         compression, diminishes with ulnar compression.Good palpable         pulses in both lower extremties. (07/28/2008 10:17) Added new observation of CARDCATHFIND: CONCLUSION:  Nonobstructive coronary artery disease.  Severe aortic stenosis.  Severe peripheral vascular disease.   PLAN:  Based on the above, I will consider aortic valve replacement. The valve area by echo was not quite as severe and probably we will repeat this.    (06/27/2008 10:17)      Cardiac Cath  Procedure date:  06/27/2008  Findings:      CONCLUSION:  Nonobstructive coronary artery disease.   Severe aortic stenosis.  Severe peripheral vascular disease.   PLAN:  Based on the above, I will consider aortic valve replacement. The valve area by echo was not quite as severe and probably we will repeat this.     Venous Doppler  Procedure date:  07/28/2008  Findings:        Right palmar arch diminishes with radial compression, normal with         ulnar compression.Left palmar arch normal with radial         compression, diminishes with ulnar compression.Good palpable         pulses in both lower extremties.  Echocardiogram  Procedure date:  08/16/2008  Findings:       -  Views were limited by surgical dressings.   -  The left ventricle was small. Overall left ventricular systolic         function was vigorous. Left ventricular ejection fraction was         estimated , range being 60 % to 65 %. There was no diagnostic         evidence of left ventricular  regional wall motion         abnormalities. Left ventricular wall thickness was mildly         increased. Features were consistent with mild diastolic         dysfunction. There was moderate flattening of the         interventricular septum.   -  There was a bioprosthetic aortic valve. No obvious perivalvular         leak. There was no significant aortic valvular regurgitation.         The mean transaortic valve gradient was 16 mmHg. The peak         transaortic valve gradient was 36 mmHg.   -  There was mild mitral annular calcification. There was mild         mitral valvular regurgitation.   -  Left atrial size was at the upper limits of normal.   -  Right ventricular systolic function was mildly reduced.   -  There appeared to be a small pericardial effusion.

## 2010-07-15 NOTE — Miscellaneous (Signed)
Summary: rx   Clinical Lists Changes  Medications: Added new medication of ALPRAZOLAM 0.25 MG TABS (ALPRAZOLAM) one as needed every 4 hours - Signed Rx of ALPRAZOLAM 0.25 MG TABS (ALPRAZOLAM) one as needed every 4 hours;  #30 x 0;  Signed;  Entered by: Sim Boast, RN;  Authorized by: Minus Breeding, MD, [facility];  Method used: Printed then faxed to [facility]  [phone]*, [address] [place], [place], [place]  [address], Ph: [phone] or [phone], Fax: [phone]    Prescriptions: ALPRAZOLAM 0.25 MG TABS (ALPRAZOLAM) one as needed every 4 hours  #30 x 0   Entered by:   Sim Boast, RN   Authorized by:   Minus Breeding, MD, [facility]   Signed by:   Sim Boast, RN on 06/01/2009   Method used:   Printed then faxed to ...       [facility]  [phone]* (retail)       [address]       [place], [place]  [address]       Ph: [phone] or [phone]       Fax: [phone]   RxID:   [id number]

## 2010-07-15 NOTE — Miscellaneous (Signed)
Summary: Orders Update  Clinical Lists Changes  Orders: Added new Test order of Abdominal Aorta Duplex (Abd Aorta Duplex) - Signed 

## 2010-07-15 NOTE — Miscellaneous (Signed)
Summary: [facility]  [facility]   Imported By: Sallee Provencal 01/09/2009 13:05:47  _____________________________________________________________________  External Attachment:    Type:   Image     Comment:   External Document

## 2010-08-27 LAB — SURGICAL PCR SCREEN
MRSA, PCR: NEGATIVE
Staphylococcus aureus: NEGATIVE

## 2010-08-27 LAB — PROTIME-INR
INR: 0.97 (ref 0.00–1.49)
Prothrombin Time: 13.1 seconds (ref 11.6–15.2)

## 2010-08-27 LAB — COMPREHENSIVE METABOLIC PANEL
ALT: 12 U/L (ref 0–35)
AST: 21 U/L (ref 0–37)
Albumin: 4.1 g/dL (ref 3.5–5.2)
Alkaline Phosphatase: 63 U/L (ref 39–117)
BUN: 11 mg/dL (ref 6–23)
CO2: 26 mEq/L (ref 19–32)
Calcium: 9.8 mg/dL (ref 8.4–10.5)
Chloride: 103 mEq/L (ref 96–112)
Creatinine, Ser: 1.27 mg/dL — ABNORMAL HIGH (ref 0.4–1.2)
GFR calc Af Amer: 51 mL/min — ABNORMAL LOW (ref >60)
GFR calc non Af Amer: 42 mL/min — ABNORMAL LOW (ref >60)
Glucose, Bld: 105 mg/dL — ABNORMAL HIGH (ref 70–99)
Potassium: 4.1 mEq/L (ref 3.5–5.1)
Sodium: 137 mEq/L (ref 135–145)
Total Bilirubin: 0.5 mg/dL (ref 0.3–1.2)
Total Protein: 6.9 g/dL (ref 6.0–8.3)

## 2010-08-27 LAB — CBC
HCT: 40.9 % (ref 36.0–46.0)
Hemoglobin: 13.9 g/dL (ref 12.0–15.0)
MCH: 29.6 pg (ref 26.0–34.0)
MCHC: 34 g/dL (ref 30.0–36.0)
MCV: 87 fL (ref 78.0–100.0)
Platelets: 243 10*3/uL (ref 150–400)
RBC: 4.7 MIL/uL (ref 3.87–5.11)
RDW: 13.9 % (ref 11.5–15.5)
WBC: 10.7 10*3/uL — ABNORMAL HIGH (ref 4.0–10.5)

## 2010-08-27 LAB — URINALYSIS, ROUTINE W REFLEX MICROSCOPIC
Bilirubin Urine: NEGATIVE
Glucose, UA: NEGATIVE mg/dL
Hgb urine dipstick: NEGATIVE
Ketones, ur: NEGATIVE mg/dL
Nitrite: NEGATIVE
Protein, ur: NEGATIVE mg/dL
Specific Gravity, Urine: 1.01 (ref 1.005–1.030)
Urobilinogen, UA: 0.2 mg/dL (ref 0.0–1.0)
pH: 5 (ref 5.0–8.0)

## 2010-08-27 LAB — APTT: aPTT: 35 seconds (ref 24–37)

## 2010-08-27 LAB — GLUCOSE, CAPILLARY
Glucose-Capillary: 123 mg/dL — ABNORMAL HIGH (ref 70–99)
Glucose-Capillary: 141 mg/dL — ABNORMAL HIGH (ref 70–99)
Glucose-Capillary: 150 mg/dL — ABNORMAL HIGH (ref 70–99)
Glucose-Capillary: 153 mg/dL — ABNORMAL HIGH (ref 70–99)
Glucose-Capillary: 154 mg/dL — ABNORMAL HIGH (ref 70–99)

## 2010-08-27 LAB — TYPE AND SCREEN
ABO/RH(D): O POS
Antibody Screen: NEGATIVE

## 2010-09-20 LAB — GLUCOSE, CAPILLARY
Glucose-Capillary: 140 mg/dL — ABNORMAL HIGH (ref 70–99)
Glucose-Capillary: 155 mg/dL — ABNORMAL HIGH (ref 70–99)

## 2010-09-21 LAB — GLUCOSE, CAPILLARY
Glucose-Capillary: 131 mg/dL — ABNORMAL HIGH (ref 70–99)
Glucose-Capillary: 144 mg/dL — ABNORMAL HIGH (ref 70–99)
Glucose-Capillary: 150 mg/dL — ABNORMAL HIGH (ref 70–99)
Glucose-Capillary: 198 mg/dL — ABNORMAL HIGH (ref 70–99)
Glucose-Capillary: 91 mg/dL (ref 70–99)
Glucose-Capillary: 100 mg/dL — ABNORMAL HIGH (ref 70–99)
Glucose-Capillary: 112 mg/dL — ABNORMAL HIGH (ref 70–99)
Glucose-Capillary: 112 mg/dL — ABNORMAL HIGH (ref 70–99)
Glucose-Capillary: 117 mg/dL — ABNORMAL HIGH (ref 70–99)
Glucose-Capillary: 125 mg/dL — ABNORMAL HIGH (ref 70–99)
Glucose-Capillary: 128 mg/dL — ABNORMAL HIGH (ref 70–99)
Glucose-Capillary: 132 mg/dL — ABNORMAL HIGH (ref 70–99)
Glucose-Capillary: 157 mg/dL — ABNORMAL HIGH (ref 70–99)
Glucose-Capillary: 215 mg/dL — ABNORMAL HIGH (ref 70–99)
Glucose-Capillary: 80 mg/dL (ref 70–99)
Glucose-Capillary: 97 mg/dL (ref 70–99)

## 2010-09-22 ENCOUNTER — Encounter (INDEPENDENT_AMBULATORY_CARE_PROVIDER_SITE_OTHER): Payer: PRIVATE HEALTH INSURANCE | Admitting: Cardiology

## 2010-09-22 ENCOUNTER — Other Ambulatory Visit: Payer: Self-pay | Admitting: Cardiology

## 2010-09-22 DIAGNOSIS — I714 Abdominal aortic aneurysm, without rupture, unspecified: Secondary | ICD-10-CM

## 2010-09-23 LAB — POCT I-STAT 3, ART BLOOD GAS (G3+)
Acid-Base Excess: 1 mmol/L (ref 0.0–2.0)
Acid-Base Excess: 2 mmol/L (ref 0.0–2.0)
Acid-base deficit: 1 mmol/L (ref 0.0–2.0)
Acid-base deficit: 2 mmol/L (ref 0.0–2.0)
Acid-base deficit: 3 mmol/L — ABNORMAL HIGH (ref 0.0–2.0)
Acid-base deficit: 5 mmol/L — ABNORMAL HIGH (ref 0.0–2.0)
Acid-base deficit: 5 mmol/L — ABNORMAL HIGH (ref 0.0–2.0)
Acid-base deficit: 7 mmol/L — ABNORMAL HIGH (ref 0.0–2.0)
Acid-base deficit: 9 mmol/L — ABNORMAL HIGH (ref 0.0–2.0)
Bicarbonate: 15.6 mEq/L — ABNORMAL LOW (ref 20.0–24.0)
Bicarbonate: 17.9 mEq/L — ABNORMAL LOW (ref 20.0–24.0)
Bicarbonate: 20 mEq/L (ref 20.0–24.0)
Bicarbonate: 20.3 mEq/L (ref 20.0–24.0)
Bicarbonate: 20.6 mEq/L (ref 20.0–24.0)
Bicarbonate: 23.4 mEq/L (ref 20.0–24.0)
Bicarbonate: 24.4 mEq/L — ABNORMAL HIGH (ref 20.0–24.0)
Bicarbonate: 24.7 mEq/L — ABNORMAL HIGH (ref 20.0–24.0)
Bicarbonate: 25.2 mEq/L — ABNORMAL HIGH (ref 20.0–24.0)
O2 Saturation: 100 %
O2 Saturation: 100 %
O2 Saturation: 92 %
O2 Saturation: 93 %
O2 Saturation: 93 %
O2 Saturation: 96 %
O2 Saturation: 96 %
O2 Saturation: 97 %
O2 Saturation: 99 %
Patient temperature: 36.3
Patient temperature: 38.3
Patient temperature: 97.4
Patient temperature: 97.6
Patient temperature: 98.8
Patient temperature: 99.04
TCO2: 16 mmol/L (ref 0–100)
TCO2: 19 mmol/L (ref 0–100)
TCO2: 21 mmol/L (ref 0–100)
TCO2: 21 mmol/L (ref 0–100)
TCO2: 22 mmol/L (ref 0–100)
TCO2: 25 mmol/L (ref 0–100)
TCO2: 25 mmol/L (ref 0–100)
TCO2: 26 mmol/L (ref 0–100)
TCO2: 26 mmol/L (ref 0–100)
pCO2 arterial: 26.7 mmHg — ABNORMAL LOW (ref 35.0–45.0)
pCO2 arterial: 30.7 mmHg — ABNORMAL LOW (ref 35.0–45.0)
pCO2 arterial: 31.6 mmHg — ABNORMAL LOW (ref 35.0–45.0)
pCO2 arterial: 34.3 mmHg — ABNORMAL LOW (ref 35.0–45.0)
pCO2 arterial: 35 mmHg (ref 35.0–45.0)
pCO2 arterial: 37.1 mmHg (ref 35.0–45.0)
pCO2 arterial: 37.6 mmHg (ref 35.0–45.0)
pCO2 arterial: 39.9 mmHg (ref 35.0–45.0)
pCO2 arterial: 47.9 mmHg — ABNORMAL HIGH (ref 35.0–45.0)
pH, Arterial: 7.32 — ABNORMAL LOW (ref 7.350–7.400)
pH, Arterial: 7.34 — ABNORMAL LOW (ref 7.350–7.400)
pH, Arterial: 7.345 — ABNORMAL LOW (ref 7.350–7.400)
pH, Arterial: 7.37 (ref 7.350–7.400)
pH, Arterial: 7.372 (ref 7.350–7.400)
pH, Arterial: 7.373 (ref 7.350–7.400)
pH, Arterial: 7.422 — ABNORMAL HIGH (ref 7.350–7.400)
pH, Arterial: 7.452 — ABNORMAL HIGH (ref 7.350–7.400)
pH, Arterial: 7.474 — ABNORMAL HIGH (ref 7.350–7.400)
pO2, Arterial: 125 mmHg — ABNORMAL HIGH (ref 80.0–100.0)
pO2, Arterial: 276 mmHg — ABNORMAL HIGH (ref 80.0–100.0)
pO2, Arterial: 310 mmHg — ABNORMAL HIGH (ref 80.0–100.0)
pO2, Arterial: 63 mmHg — ABNORMAL LOW (ref 80.0–100.0)
pO2, Arterial: 66 mmHg — ABNORMAL LOW (ref 80.0–100.0)
pO2, Arterial: 67 mmHg — ABNORMAL LOW (ref 80.0–100.0)
pO2, Arterial: 78 mmHg — ABNORMAL LOW (ref 80.0–100.0)
pO2, Arterial: 84 mmHg (ref 80.0–100.0)
pO2, Arterial: 92 mmHg (ref 80.0–100.0)

## 2010-09-23 LAB — GLUCOSE, CAPILLARY
Glucose-Capillary: 100 mg/dL — ABNORMAL HIGH (ref 70–99)
Glucose-Capillary: 101 mg/dL — ABNORMAL HIGH (ref 70–99)
Glucose-Capillary: 103 mg/dL — ABNORMAL HIGH (ref 70–99)
Glucose-Capillary: 105 mg/dL — ABNORMAL HIGH (ref 70–99)
Glucose-Capillary: 107 mg/dL — ABNORMAL HIGH (ref 70–99)
Glucose-Capillary: 110 mg/dL — ABNORMAL HIGH (ref 70–99)
Glucose-Capillary: 117 mg/dL — ABNORMAL HIGH (ref 70–99)
Glucose-Capillary: 119 mg/dL — ABNORMAL HIGH (ref 70–99)
Glucose-Capillary: 120 mg/dL — ABNORMAL HIGH (ref 70–99)
Glucose-Capillary: 121 mg/dL — ABNORMAL HIGH (ref 70–99)
Glucose-Capillary: 122 mg/dL — ABNORMAL HIGH (ref 70–99)
Glucose-Capillary: 124 mg/dL — ABNORMAL HIGH (ref 70–99)
Glucose-Capillary: 127 mg/dL — ABNORMAL HIGH (ref 70–99)
Glucose-Capillary: 129 mg/dL — ABNORMAL HIGH (ref 70–99)
Glucose-Capillary: 132 mg/dL — ABNORMAL HIGH (ref 70–99)
Glucose-Capillary: 134 mg/dL — ABNORMAL HIGH (ref 70–99)
Glucose-Capillary: 135 mg/dL — ABNORMAL HIGH (ref 70–99)
Glucose-Capillary: 138 mg/dL — ABNORMAL HIGH (ref 70–99)
Glucose-Capillary: 144 mg/dL — ABNORMAL HIGH (ref 70–99)
Glucose-Capillary: 145 mg/dL — ABNORMAL HIGH (ref 70–99)
Glucose-Capillary: 146 mg/dL — ABNORMAL HIGH (ref 70–99)
Glucose-Capillary: 146 mg/dL — ABNORMAL HIGH (ref 70–99)
Glucose-Capillary: 147 mg/dL — ABNORMAL HIGH (ref 70–99)
Glucose-Capillary: 154 mg/dL — ABNORMAL HIGH (ref 70–99)
Glucose-Capillary: 154 mg/dL — ABNORMAL HIGH (ref 70–99)
Glucose-Capillary: 156 mg/dL — ABNORMAL HIGH (ref 70–99)
Glucose-Capillary: 159 mg/dL — ABNORMAL HIGH (ref 70–99)
Glucose-Capillary: 161 mg/dL — ABNORMAL HIGH (ref 70–99)
Glucose-Capillary: 161 mg/dL — ABNORMAL HIGH (ref 70–99)
Glucose-Capillary: 161 mg/dL — ABNORMAL HIGH (ref 70–99)
Glucose-Capillary: 164 mg/dL — ABNORMAL HIGH (ref 70–99)
Glucose-Capillary: 165 mg/dL — ABNORMAL HIGH (ref 70–99)
Glucose-Capillary: 166 mg/dL — ABNORMAL HIGH (ref 70–99)
Glucose-Capillary: 166 mg/dL — ABNORMAL HIGH (ref 70–99)
Glucose-Capillary: 172 mg/dL — ABNORMAL HIGH (ref 70–99)
Glucose-Capillary: 182 mg/dL — ABNORMAL HIGH (ref 70–99)
Glucose-Capillary: 185 mg/dL — ABNORMAL HIGH (ref 70–99)
Glucose-Capillary: 186 mg/dL — ABNORMAL HIGH (ref 70–99)
Glucose-Capillary: 188 mg/dL — ABNORMAL HIGH (ref 70–99)
Glucose-Capillary: 189 mg/dL — ABNORMAL HIGH (ref 70–99)
Glucose-Capillary: 190 mg/dL — ABNORMAL HIGH (ref 70–99)
Glucose-Capillary: 194 mg/dL — ABNORMAL HIGH (ref 70–99)
Glucose-Capillary: 196 mg/dL — ABNORMAL HIGH (ref 70–99)
Glucose-Capillary: 202 mg/dL — ABNORMAL HIGH (ref 70–99)
Glucose-Capillary: 214 mg/dL — ABNORMAL HIGH (ref 70–99)
Glucose-Capillary: 215 mg/dL — ABNORMAL HIGH (ref 70–99)
Glucose-Capillary: 78 mg/dL (ref 70–99)
Glucose-Capillary: 83 mg/dL (ref 70–99)
Glucose-Capillary: 85 mg/dL (ref 70–99)
Glucose-Capillary: 87 mg/dL (ref 70–99)
Glucose-Capillary: 88 mg/dL (ref 70–99)
Glucose-Capillary: 93 mg/dL (ref 70–99)
Glucose-Capillary: 95 mg/dL (ref 70–99)
Glucose-Capillary: 99 mg/dL (ref 70–99)

## 2010-09-23 LAB — CBC
HCT: 23.8 % — ABNORMAL LOW (ref 36.0–46.0)
HCT: 24.2 % — ABNORMAL LOW (ref 36.0–46.0)
HCT: 26.6 % — ABNORMAL LOW (ref 36.0–46.0)
HCT: 26.6 % — ABNORMAL LOW (ref 36.0–46.0)
HCT: 26.7 % — ABNORMAL LOW (ref 36.0–46.0)
HCT: 27.1 % — ABNORMAL LOW (ref 36.0–46.0)
HCT: 29 % — ABNORMAL LOW (ref 36.0–46.0)
HCT: 29.9 % — ABNORMAL LOW (ref 36.0–46.0)
HCT: 33.1 % — ABNORMAL LOW (ref 36.0–46.0)
Hemoglobin: 10.2 g/dL — ABNORMAL LOW (ref 12.0–15.0)
Hemoglobin: 10.6 g/dL — ABNORMAL LOW (ref 12.0–15.0)
Hemoglobin: 11.7 g/dL — ABNORMAL LOW (ref 12.0–15.0)
Hemoglobin: 8.4 g/dL — ABNORMAL LOW (ref 12.0–15.0)
Hemoglobin: 8.5 g/dL — ABNORMAL LOW (ref 12.0–15.0)
Hemoglobin: 9.3 g/dL — ABNORMAL LOW (ref 12.0–15.0)
Hemoglobin: 9.4 g/dL — ABNORMAL LOW (ref 12.0–15.0)
Hemoglobin: 9.4 g/dL — ABNORMAL LOW (ref 12.0–15.0)
Hemoglobin: 9.7 g/dL — ABNORMAL LOW (ref 12.0–15.0)
MCHC: 35 g/dL (ref 30.0–36.0)
MCHC: 35 g/dL (ref 30.0–36.0)
MCHC: 35.2 g/dL (ref 30.0–36.0)
MCHC: 35.2 g/dL (ref 30.0–36.0)
MCHC: 35.2 g/dL (ref 30.0–36.0)
MCHC: 35.3 g/dL (ref 30.0–36.0)
MCHC: 35.5 g/dL (ref 30.0–36.0)
MCHC: 35.5 g/dL (ref 30.0–36.0)
MCHC: 35.9 g/dL (ref 30.0–36.0)
MCV: 84.1 fL (ref 78.0–100.0)
MCV: 84.2 fL (ref 78.0–100.0)
MCV: 84.5 fL (ref 78.0–100.0)
MCV: 84.5 fL (ref 78.0–100.0)
MCV: 84.7 fL (ref 78.0–100.0)
MCV: 85.7 fL (ref 78.0–100.0)
MCV: 86.1 fL (ref 78.0–100.0)
MCV: 86.5 fL (ref 78.0–100.0)
MCV: 86.5 fL (ref 78.0–100.0)
Platelets: 101 10*3/uL — ABNORMAL LOW (ref 150–400)
Platelets: 102 10*3/uL — ABNORMAL LOW (ref 150–400)
Platelets: 115 10*3/uL — ABNORMAL LOW (ref 150–400)
Platelets: 117 10*3/uL — ABNORMAL LOW (ref 150–400)
Platelets: 128 10*3/uL — ABNORMAL LOW (ref 150–400)
Platelets: 80 10*3/uL — ABNORMAL LOW (ref 150–400)
Platelets: 80 10*3/uL — ABNORMAL LOW (ref 150–400)
Platelets: 91 10*3/uL — ABNORMAL LOW (ref 150–400)
Platelets: 94 10*3/uL — ABNORMAL LOW (ref 150–400)
RBC: 2.81 MIL/uL — ABNORMAL LOW (ref 3.87–5.11)
RBC: 2.82 MIL/uL — ABNORMAL LOW (ref 3.87–5.11)
RBC: 3.09 MIL/uL — ABNORMAL LOW (ref 3.87–5.11)
RBC: 3.15 MIL/uL — ABNORMAL LOW (ref 3.87–5.11)
RBC: 3.16 MIL/uL — ABNORMAL LOW (ref 3.87–5.11)
RBC: 3.2 MIL/uL — ABNORMAL LOW (ref 3.87–5.11)
RBC: 3.38 MIL/uL — ABNORMAL LOW (ref 3.87–5.11)
RBC: 3.46 MIL/uL — ABNORMAL LOW (ref 3.87–5.11)
RBC: 3.91 MIL/uL (ref 3.87–5.11)
RDW: 13.6 % (ref 11.5–15.5)
RDW: 13.7 % (ref 11.5–15.5)
RDW: 13.7 % (ref 11.5–15.5)
RDW: 13.8 % (ref 11.5–15.5)
RDW: 13.9 % (ref 11.5–15.5)
RDW: 14 % (ref 11.5–15.5)
RDW: 14.1 % (ref 11.5–15.5)
RDW: 14.1 % (ref 11.5–15.5)
RDW: 14.4 % (ref 11.5–15.5)
WBC: 12 10*3/uL — ABNORMAL HIGH (ref 4.0–10.5)
WBC: 13 10*3/uL — ABNORMAL HIGH (ref 4.0–10.5)
WBC: 14.4 10*3/uL — ABNORMAL HIGH (ref 4.0–10.5)
WBC: 14.6 10*3/uL — ABNORMAL HIGH (ref 4.0–10.5)
WBC: 5.6 10*3/uL (ref 4.0–10.5)
WBC: 8.1 10*3/uL (ref 4.0–10.5)
WBC: 8.7 10*3/uL (ref 4.0–10.5)
WBC: 9.6 10*3/uL (ref 4.0–10.5)
WBC: 9.7 10*3/uL (ref 4.0–10.5)

## 2010-09-23 LAB — POCT I-STAT 4, (NA,K, GLUC, HGB,HCT)
Glucose, Bld: 113 mg/dL — ABNORMAL HIGH (ref 70–99)
Glucose, Bld: 119 mg/dL — ABNORMAL HIGH (ref 70–99)
Glucose, Bld: 120 mg/dL — ABNORMAL HIGH (ref 70–99)
Glucose, Bld: 131 mg/dL — ABNORMAL HIGH (ref 70–99)
Glucose, Bld: 145 mg/dL — ABNORMAL HIGH (ref 70–99)
Glucose, Bld: 192 mg/dL — ABNORMAL HIGH (ref 70–99)
Glucose, Bld: 201 mg/dL — ABNORMAL HIGH (ref 70–99)
HCT: 19 % — ABNORMAL LOW (ref 36.0–46.0)
HCT: 21 % — ABNORMAL LOW (ref 36.0–46.0)
HCT: 24 % — ABNORMAL LOW (ref 36.0–46.0)
HCT: 27 % — ABNORMAL LOW (ref 36.0–46.0)
HCT: 27 % — ABNORMAL LOW (ref 36.0–46.0)
HCT: 30 % — ABNORMAL LOW (ref 36.0–46.0)
HCT: 33 % — ABNORMAL LOW (ref 36.0–46.0)
Hemoglobin: 10.2 g/dL — ABNORMAL LOW (ref 12.0–15.0)
Hemoglobin: 11.2 g/dL — ABNORMAL LOW (ref 12.0–15.0)
Hemoglobin: 6.5 g/dL — CL (ref 12.0–15.0)
Hemoglobin: 7.1 g/dL — CL (ref 12.0–15.0)
Hemoglobin: 8.2 g/dL — ABNORMAL LOW (ref 12.0–15.0)
Hemoglobin: 9.2 g/dL — ABNORMAL LOW (ref 12.0–15.0)
Hemoglobin: 9.2 g/dL — ABNORMAL LOW (ref 12.0–15.0)
Potassium: 3.8 mEq/L (ref 3.5–5.1)
Potassium: 3.8 mEq/L (ref 3.5–5.1)
Potassium: 4.3 mEq/L (ref 3.5–5.1)
Potassium: 4.3 mEq/L (ref 3.5–5.1)
Potassium: 5.5 mEq/L — ABNORMAL HIGH (ref 3.5–5.1)
Potassium: 5.7 mEq/L — ABNORMAL HIGH (ref 3.5–5.1)
Potassium: 6.4 mEq/L (ref 3.5–5.1)
Sodium: 131 mEq/L — ABNORMAL LOW (ref 135–145)
Sodium: 134 mEq/L — ABNORMAL LOW (ref 135–145)
Sodium: 135 mEq/L (ref 135–145)
Sodium: 135 mEq/L (ref 135–145)
Sodium: 136 mEq/L (ref 135–145)
Sodium: 136 mEq/L (ref 135–145)
Sodium: 138 mEq/L (ref 135–145)

## 2010-09-23 LAB — COMPREHENSIVE METABOLIC PANEL
ALT: 1643 U/L — ABNORMAL HIGH (ref 0–35)
ALT: 2380 U/L — ABNORMAL HIGH (ref 0–35)
AST: 1312 U/L — ABNORMAL HIGH (ref 0–37)
AST: 3258 U/L — ABNORMAL HIGH (ref 0–37)
Albumin: 2.9 g/dL — ABNORMAL LOW (ref 3.5–5.2)
Albumin: 3 g/dL — ABNORMAL LOW (ref 3.5–5.2)
Alkaline Phosphatase: 100 U/L (ref 39–117)
Alkaline Phosphatase: 60 U/L (ref 39–117)
BUN: 24 mg/dL — ABNORMAL HIGH (ref 6–23)
BUN: 24 mg/dL — ABNORMAL HIGH (ref 6–23)
CO2: 26 mEq/L (ref 19–32)
CO2: 26 mEq/L (ref 19–32)
Calcium: 7.6 mg/dL — ABNORMAL LOW (ref 8.4–10.5)
Calcium: 7.7 mg/dL — ABNORMAL LOW (ref 8.4–10.5)
Chloride: 100 mEq/L (ref 96–112)
Chloride: 106 mEq/L (ref 96–112)
Creatinine, Ser: 2.32 mg/dL — ABNORMAL HIGH (ref 0.4–1.2)
Creatinine, Ser: 2.39 mg/dL — ABNORMAL HIGH (ref 0.4–1.2)
GFR calc Af Amer: 25 mL/min — ABNORMAL LOW (ref >60)
GFR calc Af Amer: 26 mL/min — ABNORMAL LOW (ref >60)
GFR calc non Af Amer: 20 mL/min — ABNORMAL LOW (ref >60)
GFR calc non Af Amer: 21 mL/min — ABNORMAL LOW (ref >60)
Glucose, Bld: 127 mg/dL — ABNORMAL HIGH (ref 70–99)
Glucose, Bld: 82 mg/dL (ref 70–99)
Potassium: 3.3 mEq/L — ABNORMAL LOW (ref 3.5–5.1)
Potassium: 3.4 mEq/L — ABNORMAL LOW (ref 3.5–5.1)
Sodium: 134 mEq/L — ABNORMAL LOW (ref 135–145)
Sodium: 140 mEq/L (ref 135–145)
Total Bilirubin: 1.1 mg/dL (ref 0.3–1.2)
Total Bilirubin: 1.4 mg/dL — ABNORMAL HIGH (ref 0.3–1.2)
Total Protein: 4.9 g/dL — ABNORMAL LOW (ref 6.0–8.3)
Total Protein: 5.2 g/dL — ABNORMAL LOW (ref 6.0–8.3)

## 2010-09-23 LAB — POCT I-STAT, CHEM 8
BUN: 15 mg/dL (ref 6–23)
BUN: 19 mg/dL (ref 6–23)
BUN: 23 mg/dL (ref 6–23)
BUN: 9 mg/dL (ref 6–23)
BUN: 9 mg/dL (ref 6–23)
Calcium, Ion: 1.03 mmol/L — ABNORMAL LOW (ref 1.12–1.32)
Calcium, Ion: 1.05 mmol/L — ABNORMAL LOW (ref 1.12–1.32)
Calcium, Ion: 1.06 mmol/L — ABNORMAL LOW (ref 1.12–1.32)
Calcium, Ion: 1.12 mmol/L (ref 1.12–1.32)
Calcium, Ion: 1.16 mmol/L (ref 1.12–1.32)
Chloride: 104 mEq/L (ref 96–112)
Chloride: 104 mEq/L (ref 96–112)
Chloride: 106 mEq/L (ref 96–112)
Chloride: 107 mEq/L (ref 96–112)
Chloride: 108 mEq/L (ref 96–112)
Creatinine, Ser: 0.8 mg/dL (ref 0.4–1.2)
Creatinine, Ser: 0.8 mg/dL (ref 0.4–1.2)
Creatinine, Ser: 1.9 mg/dL — ABNORMAL HIGH (ref 0.4–1.2)
Creatinine, Ser: 2.5 mg/dL — ABNORMAL HIGH (ref 0.4–1.2)
Creatinine, Ser: 2.6 mg/dL — ABNORMAL HIGH (ref 0.4–1.2)
Glucose, Bld: 171 mg/dL — ABNORMAL HIGH (ref 70–99)
Glucose, Bld: 181 mg/dL — ABNORMAL HIGH (ref 70–99)
Glucose, Bld: 194 mg/dL — ABNORMAL HIGH (ref 70–99)
Glucose, Bld: 198 mg/dL — ABNORMAL HIGH (ref 70–99)
Glucose, Bld: 206 mg/dL — ABNORMAL HIGH (ref 70–99)
HCT: 24 % — ABNORMAL LOW (ref 36.0–46.0)
HCT: 25 % — ABNORMAL LOW (ref 36.0–46.0)
HCT: 26 % — ABNORMAL LOW (ref 36.0–46.0)
HCT: 27 % — ABNORMAL LOW (ref 36.0–46.0)
HCT: 32 % — ABNORMAL LOW (ref 36.0–46.0)
Hemoglobin: 10.9 g/dL — ABNORMAL LOW (ref 12.0–15.0)
Hemoglobin: 8.2 g/dL — ABNORMAL LOW (ref 12.0–15.0)
Hemoglobin: 8.5 g/dL — ABNORMAL LOW (ref 12.0–15.0)
Hemoglobin: 8.8 g/dL — ABNORMAL LOW (ref 12.0–15.0)
Hemoglobin: 9.2 g/dL — ABNORMAL LOW (ref 12.0–15.0)
Potassium: 3.7 mEq/L (ref 3.5–5.1)
Potassium: 4.1 mEq/L (ref 3.5–5.1)
Potassium: 4.4 mEq/L (ref 3.5–5.1)
Potassium: 4.4 mEq/L (ref 3.5–5.1)
Potassium: 4.5 mEq/L (ref 3.5–5.1)
Sodium: 136 mEq/L (ref 135–145)
Sodium: 136 mEq/L (ref 135–145)
Sodium: 137 mEq/L (ref 135–145)
Sodium: 138 mEq/L (ref 135–145)
Sodium: 139 mEq/L (ref 135–145)
TCO2: 15 mmol/L (ref 0–100)
TCO2: 16 mmol/L (ref 0–100)
TCO2: 20 mmol/L (ref 0–100)
TCO2: 22 mmol/L (ref 0–100)
TCO2: 22 mmol/L (ref 0–100)

## 2010-09-23 LAB — BASIC METABOLIC PANEL
BUN: 11 mg/dL (ref 6–23)
BUN: 20 mg/dL (ref 6–23)
BUN: 21 mg/dL (ref 6–23)
BUN: 22 mg/dL (ref 6–23)
BUN: 23 mg/dL (ref 6–23)
BUN: 26 mg/dL — ABNORMAL HIGH (ref 6–23)
BUN: 26 mg/dL — ABNORMAL HIGH (ref 6–23)
BUN: 28 mg/dL — ABNORMAL HIGH (ref 6–23)
BUN: 29 mg/dL — ABNORMAL HIGH (ref 6–23)
CO2: 18 mEq/L — ABNORMAL LOW (ref 19–32)
CO2: 21 mEq/L (ref 19–32)
CO2: 24 mEq/L (ref 19–32)
CO2: 25 mEq/L (ref 19–32)
CO2: 25 mEq/L (ref 19–32)
CO2: 25 mEq/L (ref 19–32)
CO2: 26 mEq/L (ref 19–32)
CO2: 29 mEq/L (ref 19–32)
CO2: 31 mEq/L (ref 19–32)
Calcium: 7.5 mg/dL — ABNORMAL LOW (ref 8.4–10.5)
Calcium: 7.8 mg/dL — ABNORMAL LOW (ref 8.4–10.5)
Calcium: 8.1 mg/dL — ABNORMAL LOW (ref 8.4–10.5)
Calcium: 8.2 mg/dL — ABNORMAL LOW (ref 8.4–10.5)
Calcium: 8.2 mg/dL — ABNORMAL LOW (ref 8.4–10.5)
Calcium: 8.3 mg/dL — ABNORMAL LOW (ref 8.4–10.5)
Calcium: 8.5 mg/dL (ref 8.4–10.5)
Calcium: 8.8 mg/dL (ref 8.4–10.5)
Calcium: 8.8 mg/dL (ref 8.4–10.5)
Chloride: 104 mEq/L (ref 96–112)
Chloride: 108 mEq/L (ref 96–112)
Chloride: 93 mEq/L — ABNORMAL LOW (ref 96–112)
Chloride: 94 mEq/L — ABNORMAL LOW (ref 96–112)
Chloride: 95 mEq/L — ABNORMAL LOW (ref 96–112)
Chloride: 96 mEq/L (ref 96–112)
Chloride: 97 mEq/L (ref 96–112)
Chloride: 99 mEq/L (ref 96–112)
Chloride: 99 mEq/L (ref 96–112)
Creatinine, Ser: 1.15 mg/dL (ref 0.4–1.2)
Creatinine, Ser: 1.54 mg/dL — ABNORMAL HIGH (ref 0.4–1.2)
Creatinine, Ser: 1.71 mg/dL — ABNORMAL HIGH (ref 0.4–1.2)
Creatinine, Ser: 1.98 mg/dL — ABNORMAL HIGH (ref 0.4–1.2)
Creatinine, Ser: 2.02 mg/dL — ABNORMAL HIGH (ref 0.4–1.2)
Creatinine, Ser: 2.03 mg/dL — ABNORMAL HIGH (ref 0.4–1.2)
Creatinine, Ser: 2.09 mg/dL — ABNORMAL HIGH (ref 0.4–1.2)
Creatinine, Ser: 2.21 mg/dL — ABNORMAL HIGH (ref 0.4–1.2)
Creatinine, Ser: 2.59 mg/dL — ABNORMAL HIGH (ref 0.4–1.2)
GFR calc Af Amer: 23 mL/min — ABNORMAL LOW (ref >60)
GFR calc Af Amer: 27 mL/min — ABNORMAL LOW (ref >60)
GFR calc Af Amer: 29 mL/min — ABNORMAL LOW (ref >60)
GFR calc Af Amer: 30 mL/min — ABNORMAL LOW (ref >60)
GFR calc Af Amer: 30 mL/min — ABNORMAL LOW (ref >60)
GFR calc Af Amer: 31 mL/min — ABNORMAL LOW (ref >60)
GFR calc Af Amer: 36 mL/min — ABNORMAL LOW (ref >60)
GFR calc Af Amer: 41 mL/min — ABNORMAL LOW (ref >60)
GFR calc Af Amer: 58 mL/min — ABNORMAL LOW (ref >60)
GFR calc non Af Amer: 19 mL/min — ABNORMAL LOW (ref >60)
GFR calc non Af Amer: 22 mL/min — ABNORMAL LOW (ref >60)
GFR calc non Af Amer: 24 mL/min — ABNORMAL LOW (ref >60)
GFR calc non Af Amer: 25 mL/min — ABNORMAL LOW (ref >60)
GFR calc non Af Amer: 25 mL/min — ABNORMAL LOW (ref >60)
GFR calc non Af Amer: 25 mL/min — ABNORMAL LOW (ref >60)
GFR calc non Af Amer: 30 mL/min — ABNORMAL LOW (ref >60)
GFR calc non Af Amer: 34 mL/min — ABNORMAL LOW (ref >60)
GFR calc non Af Amer: 48 mL/min — ABNORMAL LOW (ref >60)
Glucose, Bld: 101 mg/dL — ABNORMAL HIGH (ref 70–99)
Glucose, Bld: 111 mg/dL — ABNORMAL HIGH (ref 70–99)
Glucose, Bld: 112 mg/dL — ABNORMAL HIGH (ref 70–99)
Glucose, Bld: 116 mg/dL — ABNORMAL HIGH (ref 70–99)
Glucose, Bld: 117 mg/dL — ABNORMAL HIGH (ref 70–99)
Glucose, Bld: 125 mg/dL — ABNORMAL HIGH (ref 70–99)
Glucose, Bld: 134 mg/dL — ABNORMAL HIGH (ref 70–99)
Glucose, Bld: 193 mg/dL — ABNORMAL HIGH (ref 70–99)
Glucose, Bld: 76 mg/dL (ref 70–99)
Potassium: 3.3 mEq/L — ABNORMAL LOW (ref 3.5–5.1)
Potassium: 3.7 mEq/L (ref 3.5–5.1)
Potassium: 3.8 mEq/L (ref 3.5–5.1)
Potassium: 3.9 mEq/L (ref 3.5–5.1)
Potassium: 4 mEq/L (ref 3.5–5.1)
Potassium: 4.3 mEq/L (ref 3.5–5.1)
Potassium: 4.5 mEq/L (ref 3.5–5.1)
Potassium: 4.5 mEq/L (ref 3.5–5.1)
Potassium: 4.6 mEq/L (ref 3.5–5.1)
Sodium: 131 mEq/L — ABNORMAL LOW (ref 135–145)
Sodium: 132 mEq/L — ABNORMAL LOW (ref 135–145)
Sodium: 132 mEq/L — ABNORMAL LOW (ref 135–145)
Sodium: 133 mEq/L — ABNORMAL LOW (ref 135–145)
Sodium: 134 mEq/L — ABNORMAL LOW (ref 135–145)
Sodium: 134 mEq/L — ABNORMAL LOW (ref 135–145)
Sodium: 135 mEq/L (ref 135–145)
Sodium: 136 mEq/L (ref 135–145)
Sodium: 137 mEq/L (ref 135–145)

## 2010-09-23 LAB — PREPARE PLATELETS

## 2010-09-23 LAB — CROSSMATCH
ABO/RH(D): O POS
Antibody Screen: NEGATIVE

## 2010-09-23 LAB — PROTIME-INR
INR: 1.4 (ref 0.00–1.49)
INR: 1.8 — ABNORMAL HIGH (ref 0.00–1.49)
INR: 2.2 — ABNORMAL HIGH (ref 0.00–1.49)
INR: 2.9 — ABNORMAL HIGH (ref 0.00–1.49)
INR: 3 — ABNORMAL HIGH (ref 0.00–1.49)
INR: 3.6 — ABNORMAL HIGH (ref 0.00–1.49)
INR: 3.9 — ABNORMAL HIGH (ref 0.00–1.49)
Prothrombin Time: 18 seconds — ABNORMAL HIGH (ref 11.6–15.2)
Prothrombin Time: 22.2 seconds — ABNORMAL HIGH (ref 11.6–15.2)
Prothrombin Time: 25.6 seconds — ABNORMAL HIGH (ref 11.6–15.2)
Prothrombin Time: 32.3 seconds — ABNORMAL HIGH (ref 11.6–15.2)
Prothrombin Time: 33.3 seconds — ABNORMAL HIGH (ref 11.6–15.2)
Prothrombin Time: 39.2 seconds — ABNORMAL HIGH (ref 11.6–15.2)
Prothrombin Time: 41.7 seconds — ABNORMAL HIGH (ref 11.6–15.2)

## 2010-09-23 LAB — PREPARE FRESH FROZEN PLASMA

## 2010-09-23 LAB — CREATININE, SERUM
Creatinine, Ser: 0.84 mg/dL (ref 0.4–1.2)
Creatinine, Ser: 2.02 mg/dL — ABNORMAL HIGH (ref 0.4–1.2)
GFR calc Af Amer: 30 mL/min — ABNORMAL LOW (ref >60)
GFR calc Af Amer: >60 mL/min (ref >60)
GFR calc non Af Amer: 25 mL/min — ABNORMAL LOW (ref >60)
GFR calc non Af Amer: >60 mL/min (ref >60)

## 2010-09-23 LAB — MAGNESIUM
Magnesium: 2.7 mg/dL — ABNORMAL HIGH (ref 1.5–2.5)
Magnesium: 2.9 mg/dL — ABNORMAL HIGH (ref 1.5–2.5)
Magnesium: 3.2 mg/dL — ABNORMAL HIGH (ref 1.5–2.5)

## 2010-09-23 LAB — PREPARE RBC (CROSSMATCH)

## 2010-09-23 LAB — APTT: aPTT: 40 seconds — ABNORMAL HIGH (ref 24–37)

## 2010-09-23 LAB — PHOSPHORUS: Phosphorus: 4.3 mg/dL (ref 2.3–4.6)

## 2010-09-23 LAB — PLATELET COUNT: Platelets: 102 10*3/uL — ABNORMAL LOW (ref 150–400)

## 2010-09-23 LAB — HEMOGLOBIN AND HEMATOCRIT, BLOOD
HCT: 25.2 % — ABNORMAL LOW (ref 36.0–46.0)
Hemoglobin: 8.8 g/dL — ABNORMAL LOW (ref 12.0–15.0)

## 2010-09-23 LAB — AMYLASE: Amylase: 102 U/L (ref 27–131)

## 2010-09-23 LAB — SODIUM, URINE, RANDOM: Sodium, Ur: <10 mEq/L

## 2010-09-27 ENCOUNTER — Encounter: Payer: Self-pay | Admitting: Cardiology

## 2010-09-27 LAB — POCT I-STAT 3, VENOUS BLOOD GAS (G3P V)
Acid-base deficit: 2 mmol/L (ref 0.0–2.0)
Bicarbonate: 22.6 mEq/L (ref 20.0–24.0)
O2 Saturation: 63 %
TCO2: 24 mmol/L (ref 0–100)
pCO2, Ven: 38.9 mmHg — ABNORMAL LOW (ref 45.0–50.0)
pH, Ven: 7.371 — ABNORMAL HIGH (ref 7.250–7.300)
pO2, Ven: 33 mmHg (ref 30.0–45.0)

## 2010-09-27 LAB — POCT I-STAT 3, ART BLOOD GAS (G3+)
Acid-base deficit: 1 mmol/L (ref 0.0–2.0)
Acid-base deficit: 12 mmol/L — ABNORMAL HIGH (ref 0.0–2.0)
Bicarbonate: 15.5 mEq/L — ABNORMAL LOW (ref 20.0–24.0)
Bicarbonate: 22.8 mEq/L (ref 20.0–24.0)
O2 Saturation: 90 %
O2 Saturation: 96 %
TCO2: 17 mmol/L (ref 0–100)
TCO2: 24 mmol/L (ref 0–100)
pCO2 arterial: 35.3 mmHg (ref 35.0–45.0)
pCO2 arterial: 38.6 mmHg (ref 35.0–45.0)
pH, Arterial: 7.211 — ABNORMAL LOW (ref 7.350–7.400)
pH, Arterial: 7.419 — ABNORMAL HIGH (ref 7.350–7.400)
pO2, Arterial: 69 mmHg — ABNORMAL LOW (ref 80.0–100.0)
pO2, Arterial: 81 mmHg (ref 80.0–100.0)

## 2010-09-28 LAB — URINALYSIS, ROUTINE W REFLEX MICROSCOPIC
Bilirubin Urine: NEGATIVE
Glucose, UA: NEGATIVE mg/dL
Hgb urine dipstick: NEGATIVE
Ketones, ur: NEGATIVE mg/dL
Nitrite: NEGATIVE
Protein, ur: NEGATIVE mg/dL
Specific Gravity, Urine: 1.009 (ref 1.005–1.030)
Urobilinogen, UA: 0.2 mg/dL (ref 0.0–1.0)
pH: 6 (ref 5.0–8.0)

## 2010-09-28 LAB — URINE MICROSCOPIC-ADD ON

## 2010-09-28 LAB — CBC
HCT: 37.1 % (ref 36.0–46.0)
Hemoglobin: 12.9 g/dL (ref 12.0–15.0)
MCHC: 34.8 g/dL (ref 30.0–36.0)
MCV: 84.9 fL (ref 78.0–100.0)
Platelets: 245 10*3/uL (ref 150–400)
RBC: 4.37 MIL/uL (ref 3.87–5.11)
RDW: 13.3 % (ref 11.5–15.5)
WBC: 9.1 10*3/uL (ref 4.0–10.5)

## 2010-09-28 LAB — COMPREHENSIVE METABOLIC PANEL
ALT: 21 U/L (ref 0–35)
AST: 26 U/L (ref 0–37)
Albumin: 4.1 g/dL (ref 3.5–5.2)
Alkaline Phosphatase: 80 U/L (ref 39–117)
BUN: 11 mg/dL (ref 6–23)
CO2: 23 mEq/L (ref 19–32)
Calcium: 9.7 mg/dL (ref 8.4–10.5)
Chloride: 106 mEq/L (ref 96–112)
Creatinine, Ser: 0.99 mg/dL (ref 0.4–1.2)
GFR calc Af Amer: >60 mL/min (ref >60)
GFR calc non Af Amer: 57 mL/min — ABNORMAL LOW (ref >60)
Glucose, Bld: 183 mg/dL — ABNORMAL HIGH (ref 70–99)
Potassium: 4.5 mEq/L (ref 3.5–5.1)
Sodium: 136 mEq/L (ref 135–145)
Total Bilirubin: 0.4 mg/dL (ref 0.3–1.2)
Total Protein: 6.9 g/dL (ref 6.0–8.3)

## 2010-09-28 LAB — BLOOD GAS, ARTERIAL
Acid-base deficit: 0.6 mmol/L (ref 0.0–2.0)
Bicarbonate: 22.8 mEq/L (ref 20.0–24.0)
Drawn by: 206361
FIO2: 0.21 %
O2 Saturation: 97.9 %
Patient temperature: 98.6
TCO2: 23.9 mmol/L (ref 0–100)
pCO2 arterial: 33.3 mmHg — ABNORMAL LOW (ref 35.0–45.0)
pH, Arterial: 7.451 — ABNORMAL HIGH (ref 7.350–7.400)
pO2, Arterial: 85.8 mmHg (ref 80.0–100.0)

## 2010-09-28 LAB — TYPE AND SCREEN
ABO/RH(D): O POS
Antibody Screen: NEGATIVE

## 2010-09-28 LAB — PROTIME-INR
INR: 0.9 (ref 0.00–1.49)
Prothrombin Time: 12.5 seconds (ref 11.6–15.2)

## 2010-09-28 LAB — HEMOGLOBIN A1C
Hgb A1c MFr Bld: 7.7 % — ABNORMAL HIGH (ref 4.6–6.1)
Mean Plasma Glucose: 174 mg/dL

## 2010-09-28 LAB — APTT: aPTT: 34 seconds (ref 24–37)

## 2010-09-28 LAB — ABO/RH: ABO/RH(D): O POS

## 2010-10-05 NOTE — Progress Notes (Signed)
Left message for pt to repeat testing in one year.  She is to call back with any questions

## 2010-10-26 NOTE — Assessment & Plan Note (Signed)
OFFICE VISIT   Sandra Sheppard, Sandra Sheppard  DOB:  [birth year]                                        January 11, 2010  CHART #:  [id number]   The patient returns for further followup of painful sternal wire.  She  was seen in the office previously on January 01, 2010, and a full history  and physical exam was dictated at that time.  Since then, she underwent  CT scan of the chest on January 01, 2010.  This demonstrates no significant  abnormalities with respect to the sternum itself.  In particular, the  sternum appears to have healed completely.  There is no sign of sternal  nonunion, and there are no surrounding fluid collections.  There is one  wire where the twisted end is certainly directed anteriorly in towards  the right side, and this corresponds to the area that is bothering the  patient.  We will plan to remove this wire in the operating room under  general anesthesia on Friday this week.  I have discussed the  indications and potential benefits as well as all associated risks with  the patient.  All of her questions have been addressed.   Valentina Gu. Roxy Manns, M.D.  Electronically Signed   CHO/MEDQ  D:  01/11/2010  T:  01/11/2010  Job:  [id number]

## 2010-10-26 NOTE — Assessment & Plan Note (Signed)
OFFICE VISIT   Sandra, Sheppard  DOB:  04-05-1946                                        September 15, 2008  CHART #:  [id number]   HISTORY OF PRESENT ILLNESS:  The patient returns for routine followup  status post aortic valve replacement on August 14, 2008.  Her early  postoperative recovery was somewhat slow.  She had some recurrent  paroxysmal atrial fibrillation during the first week following surgery,  for which she was treated with amiodarone and initially anticoagulated  with Coumadin.  However, her prothrombin time shot way high with very  low doses of Coumadin.  We subsequently discontinued Coumadin and she  was maintaining sinus rhythm prior to hospital discharge.  She was  subsequently discharged home only on aspirin for anticoagulation.  Following hospital discharge, she has continued to gradually improve.  She states that she is no longer having any shortness of breath.  She  still has pain and soreness related to her median sternotomy and pain in  her back that seems to be chronic.  All of this is gradually improving.  Her appetite is good.  She is not sleeping very well, and she is not  sure why.  Her bowel function is normal.  She has had no shortness of  breath.  She continues to gradually increase her activity slowly.  Her  blood sugars have reportedly been stable.   PHYSICAL EXAMINATION:  GENERAL:  Notable for a well-appearing thin  female.  VITAL SIGNS:  Blood pressure 128/66, pulse 58 regular, oxygen saturation  98% on room air.  CHEST:  A median sternotomy incision that is healing nicely.  The  sternum is stable on palpation.  Breath sounds are clear to auscultation  and symmetrical bilaterally.  No wheezes or rhonchi noted.  CARDIOVASCULAR:  Regular rate and rhythm.  No murmurs, rubs, or gallops  are appreciated.  ABDOMEN:  Soft, nontender.  EXTREMITIES:  Warm and well perfused.  There is no lower extremity  edema.   DIAGNOSTICS TEST:   Chest [facility] obtained [facility] at [facility] is reviewed.  This demonstrates clear lung fields bilaterally  with trivial bilateral pleural effusions.  All the sternal wires appear  intact.  There is no pulmonary edema.  No other abnormalities are noted.   IMPRESSION:  Overall, the patient appears to be gradually improving.  Her progress has been somewhat slow, but nonetheless acceptable and she  has no specific problems or complaints at this time other than  postoperative chest wall discomfort and back pain, which is gradually  improving.   PLAN:  I have suggested that Ms. Birman should cut her dose of  amiodarone back to 200 mg once daily.  Ultimately, she should stop  amiodarone entirely presuming that she remains in sinus rhythm.  She is  scheduled to see Dr. Percival Spanish for further followup later this month.  I  have encouraged her to gradually increase her physical activity as  tolerated with her primary limitation at this point remaining that she  refrain from any lifting or strenuous use of her arms or shoulders.  All  of her questions have been addressed.  We will plan to see her back for  further followup in 2 months.   Valentina Gu. Roxy Manns, M.D.  Electronically Signed   CHO/MEDQ  D:  09/15/2008  T:  09/16/2008  Job:  [id number]   cc:   Minus Breeding, MD, [facility]  Berneta Sages, M.D.

## 2010-10-26 NOTE — Discharge Summary (Signed)
NAME:  Sandra Sheppard, Sandra Sheppard                ACCOUNT NO.:  [id number]   MEDICAL RECORD NO.:  [id number]          PATIENT TYPE:  INP   LOCATION:  2035                         FACILITY:  [place]   PHYSICIAN:  Valentina Gu. Roxy Manns, M.D. DATE OF BIRTH:  03-01-1946   DATE OF ADMISSION:  08/14/2008  DATE OF DISCHARGE:  08/25/2008                               DISCHARGE SUMMARY   ADDENDUM   Ms. Nilsson has remained stable from the previously dictated discharge  summary.  Her Coumadin was placed on hold for supratherapeutic INR and  has trended back down to present value of 2.2.  Initially her INR jumped  to 4 with only two doses of 2.5 mg.  She actually has remained stable  from a cardiac standpoint and has had no further arrhythmias.  Since she  has remained stable, Dr. Roxy Manns has decided to discontinue her Coumadin at  this point and start her on enteric-coated aspirin 325 mg daily.  Also  since her rhythm has remained stable and she has experienced some nausea  on high-dose amiodarone, this has been decreased to 200 mg twice daily  as well.  Her blood sugars have remained stable and her creatinine has  trended back down to baseline.  She has been restarted on metformin and  her Lantus has been discontinued.  Her pacing wires and chest tube  sutures have been removed.  She has been evaluated on morning rounds by  Dr. Roxy Manns this morning and felt that she may be discharged home at this  time.  Her most recent labs prior to discharge show a sodium of 132,  potassium 4.3, BUN 20, creatinine 1.54, protime 25.6, and INR 2.2.  Otherwise, she has remained stable and is doing well.   DISCHARGE MEDICATIONS:  Unchanged from the previously dictated discharge  summary with these exceptions:  1. She will be restarted on metformin 500 mg b.i.d.  2. Coumadin has been discontinued as mentioned.  3. She will go home on amiodarone 200 mg b.i.d.  4. Enteric coated aspirin 325 mg daily.   The remainder of her discharge  instructions and followup are unchanged  from the previously dictated discharge summary.      Suzzanne Cloud, P.A.      Valentina Gu. Roxy Manns, M.D.  Electronically Signed    GC/MEDQ  D:  08/25/2008  T:  08/26/2008  Job:  [id number]   cc:   Berneta Sages, M.D.  Minus Breeding, MD, [facility]

## 2010-10-26 NOTE — Cardiovascular Report (Signed)
NAME:  Sandra Sheppard, Sandra Sheppard                ACCOUNT NO.:  [id number]   MEDICAL RECORD NO.:  [id number]          PATIENT TYPE:  OIB   LOCATION:  1961                         FACILITY:  [place]   PHYSICIAN:  Minus Breeding, MD, FACCDATE OF BIRTH:  09-Nov-1945   DATE OF PROCEDURE:  DATE OF DISCHARGE:  06/27/2008                            CARDIAC CATHETERIZATION   PRIMARY CARE PHYSICIAN:  Berneta Sages, MD   REASON FOR PRESENTATION:  Evaluate the patient with aortic stenosis,  chest pain, multiple cardiovascular risk factors.   PROCEDURE NOTE:  Left heart catheterization was performed via the right  femoral artery, right heart catheterization performed via the right  femoral vein.  Both vessels were cannulated using the anterior wall  puncture.  A #4-French arterial sheath and a #7-French venous sheath  were inserted via the modified Seldinger technique.  Preformed Judkins  and a pigtail catheter were utilized.  The patient tolerated the  procedure well and left the lab in stable condition.   RESULTS:  Hemodynamics:  RA 6, RV 35/4, PA 28/12 with a mean of 20,  pulmonary capillary pressure mean 11, cardiac output/cardiac index  (Fick) 3.6/2.2, LV 193/19, AO 165/72.  Coronaries:  Left main was  normal.  The LAD had diffuse proximal and mid 25% stenosis.  There was  diffuse nonobstructive disease throughout the LAD which was small and  did wrap the apex.  There was a ramus intermediate which was moderate  sized with mild luminal irregularities.  The circumflex was a dominant  vessel.  There were diffuse luminal irregularities in the AV groove, but  it was free of high-grade disease.  Obtuse marginal was large with  luminal irregularities.  The posterolateral had long 25% stenosis, the  PDA had moderate diffuse disease with luminal regularities.  The right  coronary artery was small and nondominant.  Left ventriculogram:  The  left ventriculogram was obtained in the RAO projection.  The EF 65% with  normal wall motion.  Aortogram:  A distal aortogram was obtained  secondary to difficulty advancing the guidewire and decreased peripheral  pulses.  She had a right renal ostial 50% stenosis.  She had an  infrarenal abdominal aortic aneurysm.  She had 60% distal stenosis at  the aortic bifurcation.  The right iliac had 80% stenosis, the left  iliac 75% stenosis.  Aortic valve area/index:  0.58/0.35.   CONCLUSION:  Nonobstructive coronary artery disease.  Severe aortic  stenosis.  Severe peripheral vascular disease.   PLAN:  Based on the above, I will consider aortic valve replacement.  The valve area by echo was not quite as severe and probably we will  repeat this.      Minus Breeding, MD, [facility]  Electronically Signed    JH/MEDQ  D:  06/27/2008  T:  06/27/2008  Job:  [id number]   cc:   Berneta Sages, M.D.

## 2010-10-26 NOTE — Procedures (Signed)
CAROTID DUPLEX EXAM   INDICATION:  Follow up known carotid artery disease.   HISTORY:  Diabetes:  No.  Cardiac:  No.  Hypertension:  No.  Smoking:  Yes.  Previous Surgery:  None.  CV History:  Amaurosis Fugax No, Paresthesias No, Hemiparesis No                                       RIGHT             LEFT  Brachial systolic pressure:         120               120  Brachial Doppler waveforms:         Biphasic          Biphasic  Vertebral direction of flow:        Antegrade         Antegrade  DUPLEX VELOCITIES (cm/sec)  CCA peak systolic                   99                [id number]  ECA peak systolic                   100               64  ICA peak systolic                   87                [id number]  ICA end diastolic                   32                48  PLAQUE MORPHOLOGY:                  Calcified         Heterogenous  PLAQUE AMOUNT:                      Mild              Moderate  PLAQUE LOCATION:                    ICA, ECA          ICA, ECA   IMPRESSION:  1. 40-59% stenosis noted in the left internal carotid artery.  2. 20-39% stenosis noted in the right internal carotid artery.  3. Antegrade bilateral vertebral arteries.   ___________________________________________  Rosetta Posner, M.D.   MG/MEDQ  D:  07/12/2007  T:  07/12/2007  Job:  [id number]

## 2010-10-26 NOTE — Op Note (Signed)
NAME:  Sandra Sheppard, Sandra Sheppard                ACCOUNT NO.:  [id number]   MEDICAL RECORD NO.:  [id number]          PATIENT TYPE:  INP   LOCATION:  2313                         FACILITY:  [place]   PHYSICIAN:  Valentina Gu. Roxy Manns, M.D. DATE OF BIRTH:  April 28, 1946   DATE OF PROCEDURE:  08/14/2008  DATE OF DISCHARGE:                               OPERATIVE REPORT   PREOPERATIVE DIAGNOSIS:  Severe aortic stenosis.   POSTOPERATIVE DIAGNOSIS:  Severe aortic stenosis.   PROCEDURE:  Median sternotomy for aortic valve replacement (19-mm  [facility] Ease pericardial tissue valve).   SURGEON:  Valentina Gu. Roxy Manns, MD   ASSISTANT:  Ms. Ara Kussmaul, CRNFA   ANESTHESIA:  General.   BRIEF CLINICAL NOTE:  The patient is a 65 year old female from [place] [place] works as Quarry manager for [employer].  The patient was recently  found to have a heart murmur on routine physical exam.  She complains of  progressive exertional shortness of breath.  An echocardiogram  demonstrates severe aortic stenosis with normal left ventricular  systolic function.  Cardiac catheterization demonstrates no significant  coronary artery disease.  A full consultation note has been dictated  previously.  The patient has been counseled regarding the indications,  risks, and potential benefits of surgery.  Alternative treatment  strategies have been discussed.  She understands and accepts all  associated risks of surgery and desires to proceed as described.   OPERATIVE FINDINGS:  1. Bicuspid native aortic valve with severe aortic stenosis.  2. Moderate left ventricular hypertrophy with normal left ventricular      systolic function.  3. Small aortic root with severe calcification of the aortic root and      the aortic annulus.   OPERATIVE NOTE IN DETAIL:  The patient was brought to the operating room  on the above-mentioned date and central monitoring was established by  the anesthesia team under the care and direction of Dr. Roberts Gaudy.  Specifically, a Swan-Ganz catheter was placed through the left internal  jugular approach.  A radial arterial line was placed.  Intravenous  antibiotics were administered.  Following induction with general  endotracheal anesthesia, a Foley catheter was placed.  The patient was  placed in the supine position with the neck gently turned towards the  left.  The patient's right neck, chest, abdomen, both groins, and both  lower extremities were prepared and draped in sterile manner.   Baseline transesophageal echocardiogram was performed.  This confirms  the presence of severe aortic stenosis.  There was bicuspid native  aortic valve with fusion of the right and noncoronary cusps of the  valve.  There was severe calcification of the valve and the aortic  annulus.  There was normal left ventricular systolic function with  moderate left ventricular hypertrophy.  There was no aortic  insufficiency.  There was trivial mitral regurgitation.  No other  abnormalities were noted.   A partial upper mini sternotomy incision was performed beginning at the  sternal notch and extending down to the level of the third intercostal  space.  The sternum was divided  vertically from the sternal notch to the  third intercostal space and teed into the right intercostal space.  The  pericardium was opened.  The ascending aorta was small in caliber and  mildly diseased with atherosclerosis.  The patient was heparinized  systemically.   The aorta was cannulated just beyond the takeoff of the innominate  artery.  A small stab incision was made in the right groin.  The right  common femoral vein was cannulated with the Seldinger technique and a  long venous guidewire was advanced up through the femoral vein, through  the inferior vena cava, through the right atrium into the superior vena  cava using transesophageal echocardiogram for guidance.  Femoral vein  was then dilated with serial dilators and the  22-French long femoral  venous cannula was advanced up over the guidewire through the inferior  vena cava, through the right atrium until the tip extends into the  superior vena cava using transesophageal echocardiogram for guidance.  The right internal jugular vein was then cannulated with the Seldinger  technique.  An Edwards EndoVent introducer sheath was advanced over the  guidewire into the internal jugular vein and the EndoVent catheter was  then advanced through the introducing sheath and floated through the  right atrium through the ventricle into the pulmonary artery using  continuous pressure monitoring to verify appropriate positioning of the  catheter.  Catheter position was also verified using transesophageal  echocardiogram.   Cardiopulmonary bypass was begun.  A retrograde cardioplegic cannula was  placed through the tip of the right atrium into the coronary sinus using  transesophageal echocardiogram for guidance.  An antegrade cardioplegic  cannula was placed directly in the ascending thoracic aorta.  The  patient was cooled to 28 degrees systemic temperature.  The aortic  crossclamp was applied and cold blood cardioplegia was delivered  initially in antegrade fashion through the aortic root.  Supplemental  cardioplegia was administered to retrograde through the coronary sinus  catheter.  The initial cardioplegic arrest was rapid with early  diastolic arrest.  Repeat doses of cardioplegia were administered  intermittently throughout the crossclamp portion of the operation  retrograde through the coronary sinus catheter to maintain completely  flat electrocardiogram.  Cardioplegia was administered at least every 20  minutes if not more frequently.   The antegrade cardioplegic cannula was removed.  An oblique aortotomy  incision was performed.  The aortic valve was exposed.  Exposure was  suboptimal due to the various small size of the patient's aortic root  and severe  calcification.  The aortic valve was excised sharply.  The  aortic annulus was decalcified.  The aortic root was small and exposure  was suboptimal.  The sinotubular junction was smaller than the aortic  annulus and somewhat calcified.  In order to optimize placement as large  of a bioprosthetic valve was possible, the aortotomy needed to be  extended below the sinotubular junction.  In order to accomplish this  safely a full sternotomy was felt to be indicated.  A midline incision  was extended inferiorly to the level of the xiphoid process.  The  remainder of the sternum was divided longitudinally.  The aortotomy was  extended below the sinotubular junction into the non-coronary sinus of  Valsalva.  Further decalcification of the aortic annulus was performed.  The aortic root was irrigated with copious saline solution to rinse out  any particulate debris.  The aortic annulus was incised to accept a 19-  mm stented  bioprosthetic tissue valve.   Aortic valve replacement was performed using interrupted 2-0 Ethibond  horizontal mattress pledgeted sutures with pledgets in the subannular  position.  An [facility] Ease pericardial tissue valve, size 90-mm,  (model number [id number], serial number [id number]) was secured in place  uneventfully.  The valve fits appropriately and the left main coronary  artery and the right coronary artery were notably far enough away from  the sewing ring to avoid obstruction.  Rewarming was begun.   The aortotomy incision was closed using a two-layer closure of 4-0  Prolene suture with Teflon felt strips to buttress the suture line.  The  patient was placed in Trendelenburg position.  One final dose of warm  retrograde hot shot cardioplegia was administered.  The aortic  crossclamp was removed after total crossclamp time of 126 minutes.  The  heart began to beat spontaneously without need for cardioversion.  The  retrograde cardioplegic cannula was removed.  A  Magoon needle is placed  in the ascending thoracic aorta to serve as a root vent.  The patient  was rewarmed to 37-degree centigrade temperature.  The aortotomy  incision was inspected for hemostasis.  Epicardial pacing wires were  fixed to the right ventricular free wall into the right atrial  appendage.  The patient was weaned from cardiopulmonary bypass without  difficulty.  The patient's rhythm at separation from bypass was second-  degree AV block.  AV sequential pacing was employed.  Total  cardiopulmonary bypass time for the operation is 158 minutes.  Followup  transesophageal echocardiogram performed by Dr. Linna Caprice after separation  from bypass demonstrates a well-seated bioprosthetic tissue valve in the  aortic position.  There was no aortic insufficiency.  There was no  perivalvular leak.  The valve was functioning normally.  Left  ventricular function was normal.  No other significant abnormalities  were noted.   Protamine was administered to reverse anticoagulation.  The aortic root  vent was removed.  The aortic cannula was removed uneventfully.  After  all of the protamine was administered, the femoral venous cannula was  removed and direct manual pressure was held on the right groin for 20  minutes.  Mediastinum was irrigated with saline solution.  Meticulous  surgical hemostasis was ascertained.  The mediastinum and the right  pleural space were drained with three chest tubes exited through  separate stab incisions inferiorly.  The sternum was closed with double-  strength sternal wire.  The soft tissues anterior to the sternum were  closed in multiple layers and the skin was closed with running  subcuticular skin closure.   The patient tolerated the procedure well and was transported to the  surgical intensive care unit in stable condition.  There were no  intraoperative complications.  All sponge, instrument, and needle counts  were verified correct at completion of  the operation.  The patient was  transfused 2 units of packed red blood cells due to anemia that was  present prior to surgery and exacerbated with hemodilution.  The patient  was transfused 2 packs of platelets and 2 units fresh frozen plasma for  thrombocytopenia and mild coagulopathy after separation from bypass.      Valentina Gu. Roxy Manns, M.D.  Electronically Signed     CHO/MEDQ  D:  08/14/2008  T:  08/15/2008  Job:  [id number]   cc:   Minus Breeding, MD, [facility]  Berneta Sages, M.D.

## 2010-10-26 NOTE — Assessment & Plan Note (Signed)
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 Dagmar Hait for evaluation of  iron deficiency anemia.   Sandra Sheppard was recently found to have an iron saturation of 9% with a  hemoglobin of 11.1 on routine blood screening on May 18, 2007.  Sandra Sheppard  really denies GI complaints, except for some mild chronic constipation  and occasional rectal discomfort, felt secondary to hemorrhoids.  Sandra Sheppard  never had colonoscopy or barium studies.  Her mother did have colon  polyps at age 19.   Sandra Sheppard denies dyspepsia, reflux symptoms, melena or hematochezia.  Her  appetite is good and her weight is stable.  Sandra Sheppard has had no  abdominopelvic surgery, except for previous total abdominal hysterectomy  and bilateral oophorectomy some 23 years ago.   PAST MEDICAL HISTORY:  Remarkable for mild essential hypertension,  hypercholesterolemia.  Sandra Sheppard also has had new-onset glucose intolerance  over the last year.   MEDICATIONS:  1. Metformin 500 mg four times a day.  2. Antara 130 mg a day.  3. Lisinopril 10 mg a day.  4. Crestor 20 mg a day.  5. Centrum multivitamins daily.  6. Aspirin 81 mg a day.   Sandra Sheppard, in the past, has had reactions to CODEINE and PENICILLIN.   FAMILY HISTORY:  Remarkable for colon polyps in her mother, otherwise  noncontributory.   SOCIAL HISTORY:  Sandra Sheppard is married, lives with her husband and works as a  Quarry manager at [employer].  Sandra Sheppard smokes a half pack of cigarettes per  day, denies ethanol use or dependency.   REVIEW OF SYSTEMS:  Otherwise entirely noncontributory.  Sandra Sheppard denies any  cardiovascular, pulmonary, or general medical problems.   EXAMINATION:  Sandra Sheppard is a healthy-appearing,  white female, in no distress,  appearing her stated age.  Sandra Sheppard is 5 feet 5 and weighs 139 pounds.  Blood pressure 128/78, pulse was  76 and regular.  I could not appreciate stigmata of chronic liver disease and there is no  thyromegaly noted.  CHEST:  Entirely clear to percussion and auscultation.  Sandra Sheppard was in a regular rhythm without murmurs, gallops or rubs.  There was  no hepatosplenomegaly, abdominal masses or tenderness.  Bowel sounds  were normal.  Peripheral extremities were unremarkable.  Mental status was clear.  Inspection of the rectum was unremarkable, as was rectal exam.  There  was a large amount of soft stool in the rectal vault that was guaiac-  negative.   ASSESSMENT:  1. Sandra Sheppard has asymptomatic iron deficiency, without any GI      symptoms, except for mild constipation.  Sandra Sheppard does have a family      history of colon polyps in her mother, has approximate 20-25%      chance of having an underlying colon polyp.  Sandra Sheppard does take  daily      aspirin tablet and may have had chronic GI blood loss, related to      salicylate use and erosive gastroduodenitis.  2. Well-controlled essential hypertension.  3. History of hyperlipidemia with glucose intolerance.  4. Status post total abdominal hysterectomy and bilateral      oophorectomy.  5. History of chronic cigarette abuse.   RECOMMENDATIONS:  I have gone ahead and set Sandra Sheppard up for  outpatient colonoscopy and have reviewed the benefits and risks of this  procedure and alternative methods of diagnosis.  If her colonoscopy is  unremarkable, we will proceed with endoscopic exam.  We will hold her  aspirin a week before this procedure, but otherwise continue all  medications as per Dr. Dagmar Hait.  We will also make adjustments in her  diabetic medications before her procedure.  We will prep her with a  balanced electrolyte solution (Movie-Prep).     Loralee Pacas. [name], MD, [name], FAGA  Electronically Signed     DRP/MedQ  DD: 07/20/2007  DT: 07/20/2007  Job #: [id number]   cc:   Berneta Sages, M.D.

## 2010-10-26 NOTE — Assessment & Plan Note (Signed)
[facility] OFFICE NOTE   Sandra Sheppard, Sandra Sheppard                       MRN:          [id number]  DATE:07/28/2008                            DOB:          12/25/1945    PRIMARY CARE PHYSICIAN:  Berneta Sages, M.D.   REASON FOR PRESENTATION:  Evaluate the patient with aortic stenosis.   HISTORY OF PRESENT ILLNESS:  The patient presents for followup of the  above.  The patient was documented recently to have a murmur.  This was  new on physical exam.  Dr. Dagmar Hait and I spoke about this.  He was  impressed that she was found on echo to have a gradient of mean 39 with  the valve area of 0.96 on echo in December.  At that time, she was  describing some episodes of chest discomfort and shortness of breath  with activity as described in the January 13th note.  I performed a  cardiac catheterization on the 15th.  This demonstrated that the left  main was normal, the LAD had diffuse proximal mid 25% stenosis.  Ramus  intermediate had luminal irregularities.  The circumflex had a  posterolateral 25% stenosis.  A PDA of abdominal circumflex was moderate  luminal irregularities.  The EF was 65%.  She did have right renal  ostial stenosis of 50%.  She had a small infrarenal abdominal aortic  aneurysm.  She had 60% stenosis at the aortic bifurcation and 80%  stenosis at the right iliac.  The left iliac at 75% stenosis.  The valve  area calculated to be quite narrowed at 0.58.   Since followup, the patient has been quite anxious about the possibility  of aortic valve replacement.  Today, she did have abdominal ultrasound  that demonstrated the aneurysm 3.2 x 3.2 cm infrarenal.  There is  moderate distal common iliac stenosis bilaterally.  She had carotid  Dopplers demonstrating to 0-39% bilateral stenosis.   She has had no new symptoms.  She has continued to get episodic burning  chest discomfort.  She has been doing as much activity  as she was though  she has not particularly short of breath.  She did not have any resting  shortness of breath.  Denies any PND or orthopnea.  She has had no new  palpitations or presyncope or syncope.  She scare to death.   PAST MEDICAL HISTORY:  1. Peripheral vascular disease as described.  2. Aortic stenosis as described.  3. Hypertension.  4. Diabetes.  5. Previous tobacco abuse (the patient is now 2 weeks without      cigarettes and is wearing a nicotine patch!).  6. Appendectomy.  7. Bilateral thumb surgeries.   ALLERGIES:  PENICILLIN and CODEINE.   MEDICATIONS:  1. Metformin 500 q.i.d.  2. Antara 130 mg daily.  3. Lisinopril 10 mg daily.  4. Centrum A-Z.  5. Aspirin 81 mg daily.  6. Crestor 20 mg daily.   REVIEW OF SYSTEMS:  As stated in the HPI and otherwise negative for  other systems.   PHYSICAL EXAMINATION:  GENERAL:  The patient is in no distress.  VITAL SIGNS:  Blood pressure 163/75, heart rate 72 and regular, weight  141 pounds and body mass index 24.  HEENT:  Eyelids unremarkable.  Pupils equal, round, and reactive to  light.  Fundi not visualized.  Oral mucosa unremarkable.  NECK:  No jugular venous distention at 45 degrees.  Carotid upstroke  brisk and symmetric.  Probable bilateral transmitted systolic murmurs,  no thyromegaly.  LYMPHATICS:  No cervical, axillary, or inguinal adenopathy.  LUNGS:  Clear to auscultation bilaterally.  BACK:  No costovertebral angle tenderness.  CHEST:  Unremarkable.  HEART:  PMI not displaced or sustained.  S1 and S2 within normal limits.  No S3, no S4.  A 3/6 apical systolic murmur radiating at the aortic  outflow tract near the carotids.  No diastolic murmurs.  ABDOMEN:  Flat, positive bowel sounds, normal in frequency and pitch.  No bruits, no rebound, no guarding.  No midline pulsatile mass.  No  organomegaly.  SKIN:  No rashes.  No nodules.  EXTREMITIES:  Upper pulses 2+, 2+ femorals with bilateral femoral  bruits,  1+ dorsalis pedis and posterior tibialis in the right, 2+  dorsalis pedis and posterior tibialis in the left, no cyanosis, no  clubbing, no edema.  NEURO:  Oriented to person, place, and time, cranial nerves II through  XII grossly intact, motor grossly intact.   EKG, sinus rhythm, rate 70, axis within normal limits, intervals within  normal limits, sinus arrhythmia, T-wave inversions in the lateral leads,  unchanged from previous.   ASSESSMENT AND PLAN:  1. Aortic stenosis.  The patient has murmur that is clearly progressed      over yearly physical exams.  By catheter aortic stenosis is severe.      It is moderately severe by echo.  She does have some symptoms.  I      think the time is now or in the very near future that she needs      valve replacement.  She is emotionally wanting to have it done now      and I think that this is very reasonable.  I am going to refer her      to cardiothoracic surgeons for aortic valve replacement.  2. Tobacco.  I am so excited that she stopped smoking.  She is to      continue with the complete abstinence.  3. Dyslipidemia.  She had elevated triglycerides when it was last      checked by Dr. Dagmar Hait.  However, her LDL and HDL were acceptable      limits.  We discussed diet and she will continue with that.  4. Diabetes.  Her hemoglobin A1c was 7.  This is treated per Dr. Dagmar Hait.  5. Hypertension.  Blood pressure is slightly elevated today, but she      is quite anxious.  We will keep an eye on this and manage this as      needed for goal, sytolic at [id number] and diastolic less than 90.  For      now, I will not change her medical regimen.  6. Peripheral vascular disease.  She will have followup studies.  She      is not having any symptoms.  The most severe disease is in her      lower extremities but again she is not having claudications.  We      will continue with the risk reduction.  7. Followup.  I  will see her back after her surgery or her surgical       consultation.     Minus Breeding, MD, [facility]  Electronically Signed    JH/MedQ  DD: 07/28/2008  DT: 07/29/2008  Job #: [id number]   cc:   Berneta Sages, M.D.

## 2010-10-26 NOTE — H&P (Signed)
HISTORY AND PHYSICAL EXAMINATION   August 04, 2008   Re:  Sandra Sheppard, Sandra Sheppard          DOB:  20-Jan-1946   Planned date of surgery on August 12, 2008   CHIEF COMPLAINT:  Exertional chest discomfort.   HISTORY OF PRESENT ILLNESS:  The patient is a 65 year old female from  [place] who works as a Quarry manager for [employer].  She was  recently noted to have heart murmur on physical exam by her primary care  physician, Dr. Berneta Sages.  She was referred to Dr. Percival Spanish at the  [facility].  A 2-D echocardiogram was performed on  June 10, 2008.  This revealed the presence of severe aortic stenosis  with normal left ventricular systolic function.  The mean gradient  across the valve was 39 mmHg with an aortic valve area estimated 0.96  cm2.  There was severe calcification of the valve with severe  restriction of all leaflets.  Left ventricular function was reportedly  normal with ejection fraction estimated 60%.  There was mild concentric  left ventricular hypertrophy with mild diastolic dysfunction.  No other  significant abnormalities were noted.  The patient then underwent  elective left and right heart catheterization on June 27, 2008.  This  confirmed the presence of severe aortic stenosis.  The aortic valve area  was estimated 0.58 centimeter squared.  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  There was no significant coronary artery  disease.  Pulmonary artery pressures were essentially normal measuring  28/12 with pulmonary capillary wedge pressure of 11.  The patient has  been referred for possible elective aortic valve replacement.   REVIEW OF SYSTEMS:  General:  The patient does report some exertional  fatigue.   Her appetite is stable.  She has not been gaining nor losing  weight.  Of significant, although she thinks that she may have gained 5  pounds or so since she quit smoking several weeks ago.  Cardiac:  The  patient describes exertional tightness across her chest.  This is  brought on with physical activity and relieved by rest.  She has had  some mild associated exertional shortness of breath, although this has  not been very noticeable according to her.  She had one dizzy spell the  other day, but she has never had any prolonged dizzy spells or syncopal  episode.  She has had some tachypalpitations off and on for several  months, and this has been bothering her.  She denies any PND, orthopnea,  or lower extremity edema.  Respiratory:  Negative.  The patient denies  productive cough, hemoptysis, wheezing.  Gastrointestinal:  Negative.  The patient has no difficulty swallowing.  She denies hematochezia,  hematemesis, melena.  Genitourinary:  Negative.  The patient denies  urinary urgency or frequency.  Peripheral Vascular:  Negative.  The  patient denies significant pain suggestive of claudication.  Neurologic:  Negative.  The patient denies transient monocular blindness or transient  numbness or weakness involving either upper or lower extremity.  Psychiatric:  Negative.  HEENT:  Negative.   PAST MEDICAL HISTORY:  1. Hypertension.  2. Hyperlipidemia.  3. Type 2 diabetes mellitus, borderline.  4. Peripheral vascular disease.  5. Tobacco abuse.  PAST SURGICAL HISTORY:  1. Appendectomy.  2. Hysterectomy.  3. Right carpal tunnel release.  4. Bilateral thumb surgery.   FAMILY HISTORY:  Noncontributory.   SOCIAL HISTORY:  The patient is married and lives with her husband in  [place].  She works as a Quarry manager for [employer].  She has a  longstanding history of tobacco abuse smoking in excess of 1 pack of  cigarettes per day for many years.  She quit approximately 3 weeks  ago.  She denies significant alcohol consumption.   CURRENT MEDICATIONS:  1. Metformin 500 mg twice daily.  2. Antara 130 mg daily.  3. Lisinopril 10 mg daily.  4. Aspirin 81 mg daily.  5. Crestor 20 mg daily.  6. Multivitamin 1 tablet daily.   DRUG ALLERGIES:  Penicillin causes rash.  Codeine causes nausea.   PHYSICAL EXAMINATION:  GENERAL:  The patient is a well-appearing female  who appears of stated age, in no acute distress.  VITAL SIGNS:  Blood pressure 168/78, pulse 70, and oxygen saturation 98%  on room air.  HEENT:  Unrevealing.  NECK:  Supple.  There is no cervical or supraclavicular lymphadenopathy.  There is no jugular venous distention.  There are no carotid bruits.  CHEST:  Auscultation of the chest demonstrates clear breath sounds,  which are symmetrical bilaterally.  No wheezes or rhonchi are  demonstrated.  CARDIOVASCULAR:  Notable for regular rate rhythm.  There is a grade 3/6  systolic murmur heard best along the right upper sternal border with  radiation across the precordium and towards the neck.  No diastolic  murmurs are noted.  ABDOMEN:  Soft, nondistended, nontender.  Pulses are palpable in the  groin.  Distal pulses are not palpable in either lower leg or at the  ankle.  There is no venous insufficiency.  SKIN:  Clean, dry, and healthy appearing throughout.  RECTAL/GU:  Both deferred.  NEUROLOGIC:  Grossly nonfocal and symmetrical throughout.   DIAGNOSTIC TESTS:  A 2-D echocardiogram performed on June 10, 2008,  is reviewed.  This demonstrates severe aortic stenosis.  There is normal  left ventricular systolic function.  Aortic valve appears heavily  calcified.  There is no significant aortic regurgitation.  No other  complicating features are noted.   Cardiac catheterization performed on June 27, 2008, is reviewed.  This confirmed the presence of severe aortic stenosis by report of  hemodynamic data as described previously.  There is no  significant  coronary artery disease.  Pulmonary artery pressures are normal.  The  patient does have a small infrarenal abdominal aortic aneurysm and  peripheral vascular disease.   PLAN:  I have discussed options at length in the office today with the  patient and her husband.  The relative risks and benefits of surgery  versus continued medical therapy has been discussed at length.  She is  eager to proceed with surgery as soon as practical.  They understand and  accept all potential associated risks including but not limited to risk  of death, stroke, myocardial infarction, congestive heart failure,  respiratory failure, pneumonia, bleeding requiring blood transfusion,  arrhythmia, heart block with bradycardia requiring permanent pacemaker.  We also discussed at length surgical alternatives including whether or  not to replace her valve using a mechanical prosthesis versus a  bioprosthetic tissue valve.  After considerable discussion, the patient  specifically requests that we use a bioprosthetic tissue valve.  She  understands that will come with some risk of  late structural valve  deterioration and failure that potentially could mean repeat surgery at  a later date.  However, this also carries with it the benefit of no need  for long-term anticoagulation with Coumadin.  She understands and  accepts all associated risks as well as the implications of this  decision and all the questions have been addressed.  We tentatively  planned to proceed with surgery on Tuesday August 12, 2008, using  minimally invasive approach.  Based upon severe peripheral vascular  disease, femoral artery cannulation will probably best be avoided.  We  will therefore, likely use a partial upper sternotomy unless convenient  cannulation  of the aorta can be accomplished through a mini-thoracotomy incision.  All of her questions have been addressed.   Sandra Sheppard, M.D.  Electronically Signed    CHO/MEDQ  D:  08/04/2008  T:  08/04/2008  Job:  [id number]   cc:   Minus Breeding, MD, [facility]  Berneta Sages, M.D.

## 2010-10-26 NOTE — Assessment & Plan Note (Signed)
OFFICE VISIT   Sandra Sheppard, Sandra Sheppard  DOB:  [birth year]                                        December 29, 2008  CHART #:  [id number]   The patient returns for further followup, status post aortic valve  replacement on August 14, 2008.  She was last seen here in the office on  September 15, 2008.  Since then, the patient has done well.  She has been  actively participating in the cardiac rehab program and she has really  enjoyed it and has felt that it has really made her feel better and  improve her exercise tolerance.  She still has some soreness in her  anterior chest wall, but this is slowly improving as well.  She denies  any sensation of clicking or motion of the sternum to suggest underlying  sternal instability.  She denies any fevers or chills.  Her exercise  tolerance is much better than it ever was prior to surgery.  Otherwise,  she has no complaints.  The remainder of her review of systems is  unremarkable.  She was seen in the office for routine followup by Dr.  Percival Spanish last month.  She has not yet had a followup echocardiogram  performed.  She is no longer taking amiodarone.  She reports occasional  palpitation, but no dizzy spells and no sustained sensation of  irregularity and heart rhythm.  The remainder of her review of systems  is unremarkable.   PHYSICAL EXAMINATION:  GENERAL:  A well-appearing female.  VITAL SIGNS:  Blood pressure 138/77, pulse 77 and regular, and oxygen  saturation is 98% on room air.  CHEST:  A well-healed median sternotomy scar.  The sternum is stable on  palpation.  LUNGS:  Auscultation of the chest demonstrates clear breath sounds which  are symmetrical.  No wheezes or rhonchi are noted.  CARDIOVASCULAR:  Regular rate and rhythm.  There is a grade 2/6 systolic  murmur heard along the sternal border.  No diastolic murmurs are noted.  ABDOMEN:  Soft and nontender.  EXTREMITIES:  Warm and well perfused.  There is no lower  extremity  edema.   IMPRESSION:  Satisfactory progress following aortic valve replacement.   PLAN:  I think the patient can return to work and return to normal  physical activity.  I have cautioned her that while she still has some  soreness in her anterior chest, she should be careful not to lift  anything too heavy or do anything too strenuous with her arms or  shoulders.  This should continue to slowly improve over time.  All of  her questions have been addressed.  In the future she will call and  return to see [place] here at [facility] only should  further problems or difficulties arise.   Valentina Gu. Roxy Manns, M.D.  Electronically Signed   CHO/MEDQ  D:  12/29/2008  T:  12/30/2008  Job:  [id number]   cc:   Minus Breeding, MD, [facility]  Berneta Sages, M.D.

## 2010-10-26 NOTE — H&P (Signed)
HISTORY AND PHYSICAL EXAMINATION   January 01, 2010   Re:  Sandra Sheppard, Sandra Sheppard          DOB:  17-Oct-1945   Date of planned surgery will be January 15, 2010.   PRESENTING CHIEF COMPLAINT:  Sharp painful area on chest associated with  sternal wire.   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  Her postoperative recovery was  uncomplicated.  She was last seen here in the office approximately 1  year ago.  Since then, she has continued to do very well overall.  However, recently she has developed increasing pain associated with an  area in the midportion of her sternum on the right side.  In this area,  she can palpate a very hard structure in the subcutaneous tissues and  she states that with certain movements, she gets sharp pains associated  with this area.  She denies any sensation of clicking or motion of the  sternum.  She has not had fevers or chills.  She has not had any  shortness of breath and she otherwise feels quite well.  The remainder  of her review of systems is unremarkable.  The remainder of her past  medical history is unchanged.   PAST MEDICAL HISTORY:  Hypertension, hyperlipidemia, type 2 diabetes  mellitus, peripheral vascular disease, and remote history of tobacco  abuse.   PAST SURGICAL HISTORY:  Aortic valve replacement in August 14, 2008, with  a bioprosthetic tissue valve; appendectomy; hysterectomy; right carpal  tunnel release; and bilateral thumb surgery.   SOCIAL HISTORY:  The patient is married and lives in [place].  She  works as a Quarry manager for [employer].  She quit smoking at the time  of her heart surgery over a year ago.  She denies excessive alcohol  consumption.   CURRENT MEDICATIONS:  1. Metformin 500 mg twice daily.  2. Lisinopril 20 mg daily.  3. Aspirin 81 mg daily.  4. Trilipix 135 mg  daily.  5. Pravastatin 40 mg daily.   DRUG ALLERGIES:  Penicillin and codeine, which both cause rash and  nausea.   PHYSICAL EXAMINATION:  Notable for a thin white female who appears her  stated age, in no acute distress.  Blood pressure 127/75, pulse 88  regular, and oxygen saturation 98% on room air.  Examination of the  chest reveals a well-healed median sternotomy scar.  The sternum is  stable on palpation.  The patient is thin and it is easily to palpate,  to cut ends of her sternal wires.  There is one that is very prominent  in the midportion of the body of the sternum on the right side.  This is  the one that causes significant pain and is very tender on palpation.  There is no associated fluctuance on exam and there is no surrounding  redness or tenderness to the skin.  The sternum is entirely stable on  exam.  Cardiovascular exam demonstrates regular rate and rhythm.  There  is a grade 2/6 systolic murmur heard best along the sternal border.  No  diastolic murmurs are noted.  The abdomen is soft and nontender.  The  extremities are warm and well perfused.  There is no lower extremity  edema.   DIAGNOSTIC TESTS:  Chest x-ray performed at [facility]  on February 17, 2009, is  reviewed.  This demonstrates clear lung fields  bilaterally.  All the sternal wires appeared to be intact and there is  no sign of sternal dehiscence.  The patient did have a figure-of-eight  wire as well as a wire on the right side of the body of the sternum  because of the fact that her original procedure was started as a partial  upper ministernotomy and converted to full sternotomy because of her  very small aortic root and severely diseased aorta.  There is no obvious  sign of sternal fracture on her radiograph.   IMPRESSION:  Painful, tender sternal wire that is quite prominent in the  subcutaneous tissues as this the patient is quite thin.  There does not  appear to be any  definite sign of sternal fracture or dehiscence.   PLAN:  We will obtain chest CT scan without IV contrast to make sure  that the sternum is healed and intact.  Assuming this is the case and we  can plan to remove the painful sternal wire for symptomatic improvement.  I have discussed the indications, risks, and benefits with the patient.  All of her questions have been addressed.  We will plan to see her back  in the office on  January 11, 2010, to review her CT scan.  We tentatively make plans for  surgery on Friday, January 15, 2010.   Valentina Gu. Roxy Manns, M.D.  Electronically Signed   CHO/MEDQ  D:  01/01/2010  T:  01/02/2010  Job:  [id number]   cc:   Berneta Sages, M.D.

## 2010-10-26 NOTE — Op Note (Signed)
NAME:  LYNNDA, Sandra Sheppard                ACCOUNT NO.:  [id number]   MEDICAL RECORD NO.:  [id number]          PATIENT TYPE:  INP   LOCATION:  2313                         FACILITY:  [facility]   PHYSICIAN:  Glynda Jaeger, M.D.  DATE OF BIRTH:  12-06-1945   DATE OF PROCEDURE:  08/14/2008  DATE OF DISCHARGE:                               OPERATIVE REPORT   PROCEDURE:  Intraoperative transesophageal echocardiography.   Ms. Skylei Kendziorski is a 65 year old white female with a history of severe  aortic stenosis.  She is scheduled to undergo aortic valve replacement  by Dr. Darylene Price.  Intraoperative transesophageal echocardiography  was requested to evaluate the aortic valve to assist with the operative  procedure and to assess for any other valvular pathology and to serve as  a monitor for intraoperative volume status.   The patient was brought to the operating room at [facility] and  general anesthesia was induced without difficulty.  The trachea was  intubated without difficulty.  The transesophageal echocardiography  probe was inserted into the esophagus without difficulty.   IMPRESSION:  Pre-bypass findings:  1. Aortic valve.  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  The raphe      was heavily calcified and there was an extensive calcification      noted in the coaptation line.  The aortic annulus measured 1.5 cm.      The aortic root at the sinotubular ridge measured 2.6 cm and the      proximal ascending aorta measured 2.3 cm.  The continuous wave      Doppler interrogation of the left ventricular outflow tract      revealed a peak velocity of 3.4 meters per second with a mean      gradient of 23 mmHg, aortic valve area using the continuity      equation by VTI method revealed a aortic valve area of 0.41 cm sq.      There was no aortic insufficiency appreciated.  2. Mitral valve.  The mitral valve showed  mild mitral annular      calcification with the leaflets coapted well with trace mitral      insufficiency.  There was no prolapse or fluttering of the      leaflets.  3. Left ventricle.  There was vigorous left ventricular contractility      in all segments interrogated.  Left ventricular wall thickness      measured 1.1-1.2 cm concentrically at the mid papillary level at      the end diastole.  Left ventricular end-diastolic diameter measured      4.3 cm.  Left ventricular ejection fraction was estimated at 60%.  4. Right ventricle.  The right ventricular size appeared to be within      normal limits with good contractility of the right ventricular free      wall and normal-appearing right ventricular function.  5. Tricuspid valve.  The tricuspid valve showed 1+ tricuspid      insufficiency  and the leaflets appeared structurally normal.  6. Interatrial septum.  The interatrial septum was intact without      evidence of patent foramen ovale or atrial septal defect by color      Doppler or bubble study.  7. Left atrium.  The left atrium showed no evidence of thrombus in the      left atrium or left atrial appendage.  8. Ascending aorta.  The ascending aorta showed calcification of the      aortic wall, but no mobile plaques noted.  9. Descending aorta.  The descending aorta showed mild to moderate      atheromatous disease and measured 2.3 cm in diameter.   Post-bypass findings:  1. Aortic valve.  There was a bioprosthetic valve in the aortic      position.  The leaflets opened normally.  There was no aortic      insufficiency.  Maximum peak instantaneous gradient measured 40      mmHg with a mean gradient of 6 mmHg.  2. Mitral valve.  The mitral valve was unchanged from the prebypass      study.  There was trace mitral insufficiency and no prolapse or      fluttering of the leaflets.  3. Left ventricle.  The left ventricular function again appeared      vigorous, good contractility  in all segments interrogated.      Ejection fraction estimated at 60%.  4. Right ventricle.  The right ventricular function appeared to be      within normal limits with good contractility of the right      ventricular free wall.           ______________________________  Glynda Jaeger, M.D.     DCJ/MEDQ  D:  08/14/2008  T:  08/15/2008  Job:  [id number]

## 2010-10-26 NOTE — Assessment & Plan Note (Signed)
[facility] OFFICE NOTE   JAQUELA, SHAKER                       MRN:          [id number]  DATE:06/25/2008                            DOB:          1945-08-18    PRIMARY CARE PHYSICIAN:  Berneta Sages, MD   REASON OF PRESENTATION:  Evaluate the patient with abnormal  echocardiogram.   HISTORY OF PRESENT ILLNESS:  The patient is a lovely 65 year old white  female.  I see her husband as a patient.  She has not had cardiac  disease.  She did have a stress test here apparently in 2006, however,  she was recently noted to have a heart murmur.  She had an  echocardiogram.  This demonstrated aortic stenosis with valve area  calculated to be 0.96 with a mean gradient of 39.  She had a well  preserved ejection fraction.  However, there was evidence of left  ventricular hypertrophy.  An EKG done at the Dr. Danna Hefty office suggested  inferior Q-waves, although the one repeated here does not demonstrate  that.   The patient really minimizes the symptoms, but does admit that over the  last 3 or 4 months, she has noticed some chest discomfort and shortness  of breath.  The most exerting thing she does is there be active in the  [facility].  She says she is running around there being active and will  get some chest tightness and shortness of breath.  She stops if she is  doing and it goes away.  She thinks it has been relatively stable over 4  or 5 months.  There is no associated nausea, vomiting, or diaphoresis.  There is no associated palpitations, presyncope, or syncope.  She has  had no PND or orthopnea.  The patient does have multiple cardiovascular  risk factors.  She works, but it is emotionally stressful, but not an  exertional job.  She does not get an symptoms on this job.  She makes  eye glasses.   PAST MEDICAL HISTORY:  Bilateral carotid stenosis nonobstructive  followed by vascular surgeons, hypertension  borderline, diabetes  borderline, tobacco abuse.   PAST SURGICAL HISTORY:  Appendectomy, bilateral thumb surgeries.   ALLERGIES:  PENICILLIN and CODEINE.   MEDICATIONS:  Metformin 500 mg 4 times a day, Antara, lisinopril 10 mg  daily, aspirin 81 mg daily, multivitamin.   SOCIAL HISTORY:  The patient is married for 6 years to her current  husband.  She likes bingo and dancing.  She has been smoking at least a  half pack a day for 40 years.  She does not drink alcohol.   FAMILY HISTORY:  Contributory for brother dying of myocardial infarction  at 20.  Both her parents died from heart disease at later ages.  Her  father had coronary artery disease and heart failure.   REVIEW OF SYSTEMS:  As stated in the HPI.  Positive for rare  palpitations.  Negative for all other systems.   PHYSICAL EXAMINATION:  GENERAL:  The patient is in no distress.  VITAL SIGNS:  Blood  pressure 145/76, heart rate 79 and regular,  respiratory rate 14.  HEENT:  Eyes unremarkable, pupils equal, round, and reactive to light,  fundi within normal limits.  Oral mucosa are unremarkable, edentulous.  NECK:  No jugular vein distention at 45 degrees, bilateral carotid  bruits versus transmitted murmur, no thyromegaly.  LYMPHATICS:  No cervical, axillary, inguinal adenopathy.  LUNGS:  Clear to auscultation bilaterally.  BACK:  No costovertebral angle tenderness.  CHEST:  Unremarkable.  HEART:  PMI not displaced or sustained, S1 and S2 within normal limits,  no S3, no S4, 3/6 apical systolic murmur radiating at the aortic outflow  tract.  No diastolic murmur.  ABDOMEN:  Flat, positive bowel sounds.  Normal in frequency and pitch.  No bruits.  No rebound, guarding, or midline pulsatile mass,  hepatomegaly, splenomegaly.  SKIN:  No rashes, no nodules.  EXTREMITIES:  2+ pulses, 2+ femorals with bilateral femoral bruits, 1+  dorsalis pedis and posterior tibialis in the right, 2+ dorsalis pedis  and posterior tibialis in  the left.  No cyanosis, clubbing, or edema.  NEUROLOGIC:  Oriented to person, place, and time.  Cranial nerves II  through XII are grossly intact.  Motor grossly intact.   EKG sinus rhythm, rate 71, axis within normal limits.  Intervals within  normal limits, lateral T-wave inversions could not exclude ischemia.   ASSESSMENT AND PLAN:  1. The patient does describe some chest discomfort and dyspnea.  This      could be related to her valve disease.  Also, I have to be      concerned about obstructive coronary disease as she has significant      cardiovascular risk factors.  I think the probability of      obstructive coronary disease is high.  Therefore, in the situation,      I would not suggest repeat stress testing rather I would proceed      with right and left heart catheterization.  This will allow me to      quantify any coronary disease as well as quantify the degree of      aortic stenosis.  She understands the risks and benefits of cardiac      catheterization and agrees to proceed.  2. Hypertension.  Blood pressure is slightly elevated.  This is      followed by Dr. Dagmar Hait.  I make no change for medicines.  3. Diabetes.  The patient says she has borderline diabetes.      However, she is on multiple medications, I think she tends to      minimize her disease processes.  This is followed by Dr. Dagmar Hait.  4. Risk reduction.  I might suggest a statin empirically, but would      defer to Dr. Dagmar Hait.  She will remain on Antara.  5. Tobacco.  I discussed the need to stop smoking (greater than 3      minutes).  6. Followup.  I will see her at the time of catheterization.     Minus Breeding, MD, [employer]  Electronically Signed    JH/MedQ  DD: 06/25/2008  DT: 06/26/2008  Job #: [id number]   cc:   Berneta Sages, M.D.

## 2010-10-26 NOTE — Discharge Summary (Signed)
NAME:  Sandra Sheppard, Sandra Sheppard                ACCOUNT NO.:  [id number]   MEDICAL RECORD NO.:  [id number]          PATIENT TYPE:  INP   LOCATION:  2035                         FACILITY:  [facility]   PHYSICIAN:  Valentina Gu. Roxy Manns, M.D. DATE OF BIRTH:  May 07, 1946   DATE OF ADMISSION:  08/14/2008  DATE OF DISCHARGE:                               DISCHARGE SUMMARY   PRIMARY ADMITTING DIAGNOSIS:  Severe aortic stenosis.   ADDITIONAL/DISCHARGE DIAGNOSES:  1. Severe aortic stenosis.  2. Postoperative atrial fibrillation.  3. Hypertension.  4. Hyperlipidemia.  5. Type 2 diabetes mellitus.  6. Peripheral vascular disease.  7. Tobacco abuse.  8. Postoperative renal insufficiency.  9. Congestive heart failure.   PROCEDURES PERFORMED:  Aortic valve replacement with 19-mm [facility]  Ease pericardial tissue valve.   HISTORY OF PRESENT ILLNESS:  The patient is a 65 year old female who was  recently noted to have a heart murmur on physical exam by her primary  care physician Dr. Dagmar Hait.  She was referred to Dr. Minus Breeding for  evaluation and underwent a 2-D echocardiogram in December 2009.  She was  found to have severe aortic stenosis with normal left ventricular  systolic function.  She then underwent elective left and right heart  catheterization in January 2010, which confirmed the presence of severe  aortic stenosis.  There was no significant coronary artery disease.  Because of these findings, she was referred to Dr. Darylene Price for  surgical evaluation.  Dr. Roxy Manns reviewed her films and agreed that she  would benefit from aortic valve replacement at this time.  He explained  all risks, benefits, and alternatives of surgery to the patient and she  agreed to proceed.   HOSPITAL COURSE:  Sandra Sheppard was admitted to [facility] on  August 14, 2008.  She underwent aortic valve replacement as described  above.  Please see previously dictated operative report for complete  details.  She  tolerated the procedure well and was transferred to the  SICU in stable condition.  She was able to be extubated shortly after  surgery.  She was stable, but required DDD pacing and Neo-Synephrine  drip on postop day #1.  She was mildly hypotensive and her beta-blockers  and diuretics were held.  Her underlying rhythm beneath the pacer  appeared to be junctional versus low atrial fibrillation.  She was  started on p.o. amiodarone.  She was ultimately able to be weaned from  the pressor agents and because of significant volume overload she was  started on a Lasix drip.  Her pulmonary status remained somewhat tenuous  secondary to her volume overload and she was also treated with  aggressive pulmonary toilet measures.  She developed an acute renal  insufficiency with creatinine spike to 2.6.  She developed some  elevation in her LFTs which was felt to be secondary to CHF.  Her peak  SGOT was 3258 and SGPT was 2380.  Followup LFT panel showed improvement  after discontinuation of her statin as well as treatment of her CHF.  She continued to show slow and steady improvement.  She received a  transfusion of packed red blood cells for an acute blood loss anemia on  postop day #5.  At that point, otherwise, she was stable and doing well.  Her rhythm was remaining in sinus.  Her CHF and acute renal  insufficiency were both improving and she was able to be transferred to  the Step-Down Unit.  Following her transfer to the floor, she would  develop rapid atrial fibrillation and was started on IV amiodarone.  She  did convert back to normal sinus rhythm.  Despite the IV amiodarone, she  had a recurrent episode of atrial fibrillation with heart rates in 120s  and 130s.  Because of this, she was re-bolused with IV amiodarone,  started on Coumadin, and her beta-blocker dosage was increased.  Since  that time, she has had several intermittent episodes of atrial  fibrillation, but subsequently converted  back to normal sinus rhythm  each time.  Over the past 24 hours, she has maintained normal sinus  rhythm and she subsequently has been switched to a p.o. dose of  amiodarone.  Overall, she is making slow but steady progress.  As  previously mentioned, she has maintained normal sinus rhythm for 24  hours.  Her heart rates have been in the 60s with stable blood pressures  in the [id number] systolic.  She has been weaned from supplemental oxygen  and is maintaining O2 sats of greater than 90% on room air.  Her  incisions are all healing well.  She is ambulating with cardiac rehab  phase 1 and with physical therapy and is making progress.  Her renal  function has remained stable and is slowly improving.  Her BUN and  creatinine on August 22, 2008, are 28 and 2.0 respectively.  Her INR did  became supratherapeutic to 3.9 and her Coumadin is currently being held.  Her most recent hemoglobin and hematocrit are 10.6 and 28.9  respectively.  Because of her renal insufficiency, she has not been  restarted on metformin for her history of diabetes, but has been  maintained on Lantus.  Her blood sugars have been stable.  We anticipate  that once her renal function is back to baseline her metformin may be  restarted and the Lantus may be discontinued.  She continues to show  slow but steady progress.  Hopefully, in the next 24-48 hours, her INR  will trend down and her pacing wires may be removed.  She will also  undergo a repeat BMET to check her renal function.  If all this has  continued to improve and she is otherwise doing well, she will hopefully  be ready for discharge home.   DISCHARGE MEDICATIONS:  1. Ultram 50-100 mg q.4-6 h. p.r.n. for pain.  2. Coumadin, home dose will be determined by PT and INR drawn on the      date of discharge.  3. Amiodarone 400 mg b.i.d. x1 week and then 200 mg b.i.d.  4. Antara 130 mg nightly.  5. Multivitamin daily.  6. Nicoderm patches daily.  7. Decision will  be made regarding restarting her metformin at the      time of discharge.   DISCHARGE INSTRUCTIONS:  She is asked to refrain from driving, heavy  lifting, or strenuous activity.  She may continue ambulating daily and  using her incentive spirometer.  She may shower daily and clean her  incisions with soap and water.  She will continue low-fat, low-sodium  diabetic diet.   DISCHARGE  FOLLOWUP:  She will need to make an appointment to see Dr.  Percival Spanish in 2 weeks.  She will then follow up with Dr. Roxy Manns on September 15, 2008, with a chest x-ray.  She will also follow up within 48 hours of  discharge with the [facility] for management of her  anticoagulation.  In the interim, if she experiences any problems or has  questions, she is asked to contact our office immediately.      Suzzanne Cloud, P.A.      Valentina Gu. Roxy Manns, M.D.  Electronically Signed    GC/MEDQ  D:  08/22/2008  T:  08/23/2008  Job:  [id number]   cc:   Berneta Sages, M.D.  Minus Breeding, MD, [facility]

## 2010-10-26 NOTE — Letter (Signed)
September 04, 2007    Berneta Sages, M.D.  [address]  [place], [place] [address]   RE:  Sandra Sheppard, Sandra Sheppard  MRN:  [id number]  /  DOB:  10/11/1945   Dear Dr. Dagmar Hait:   I appreciate the opportunity to see Sandra Sheppard for evaluation of her  iron-deficiency anemia and her family history of colon polyps in her  mother.   I hope you received a copy of my endoscopy and colonoscopy reports from  August 08, 2007.  Her colonoscopy was essentially unremarkable except  for nonbleeding internal hemorrhoids.  She had a negative endoscopy and  suffered some mild gastritis felt secondary to her salicylate use.  Duodenal biopsy for celiac disease was negative, and gastric biopsy  showed no evidence of H. pylori infection.   She was treated with a month of Aciphex 20 mg daily, and she is  currently asymptomatic.  I have sent her by the lab to follow up CBC and  iron studies. Will check outpatient Hemoccult cards.  If stools are  still guaiac positive, will proceed with a camera capsule exam of her  small intestine.    Sincerely,      Sandra Pacas. [name], MD, [name], [place]  Electronically Signed    DRP/MedQ  DD: 09/04/2007  DT: 09/04/2007  Job #: [id number]

## 2010-10-26 NOTE — Assessment & Plan Note (Signed)
OFFICE VISIT   Sandra Sheppard, Sandra Sheppard  DOB:  [birth year]                                        February 01, 2010  CHART #:  [id number]   The patient returns for routine follow up status post sternal wire  removal on January 15, 2010.  She has done well postoperatively and not  had any problems.  She returned to the office and had her skin sutures  removed.  She reports that it feels much better.  The pain she was  appreciating previously has completely resolved now that the sternal  wires have been removed.  She does not feel any sensation of clicking or  motion to suggest delayed fracture, and the CT scan that she had  performed prior to surgery suggests that the bone has completely healed  and remains solid.  Overall, she has no complaints.  On exam, her  surgical incision has healed completely.  The sternum remains stable.  In the future, the patient will call and return to see [place] as needed.  I  think she can return to normal activity without restrictions.   Valentina Gu. Roxy Manns, M.D.  Electronically Signed   CHO/MEDQ  D:  02/01/2010  T:  02/02/2010  Job:  [id number]   cc:   Berneta Sages, M.D.  Minus Breeding, MD, [facility]

## 2011-01-19 ENCOUNTER — Telehealth: Payer: Self-pay | Admitting: Cardiology

## 2011-01-19 NOTE — Telephone Encounter (Signed)
All Cardiac records faxed to [facility] @ [phone] 01/19/11/km

## 2011-04-22 ENCOUNTER — Ambulatory Visit: Payer: PRIVATE HEALTH INSURANCE | Admitting: Cardiology

## 2011-05-27 ENCOUNTER — Encounter: Payer: Self-pay | Admitting: *Deleted

## 2011-05-30 ENCOUNTER — Ambulatory Visit (INDEPENDENT_AMBULATORY_CARE_PROVIDER_SITE_OTHER): Payer: Medicare Other | Admitting: Cardiology

## 2011-05-30 ENCOUNTER — Encounter: Payer: Self-pay | Admitting: Cardiology

## 2011-05-30 DIAGNOSIS — I10 Essential (primary) hypertension: Secondary | ICD-10-CM

## 2011-05-30 DIAGNOSIS — I714 Abdominal aortic aneurysm, without rupture, unspecified: Secondary | ICD-10-CM

## 2011-05-30 DIAGNOSIS — I6529 Occlusion and stenosis of unspecified carotid artery: Secondary | ICD-10-CM

## 2011-05-30 DIAGNOSIS — I359 Nonrheumatic aortic valve disorder, unspecified: Secondary | ICD-10-CM

## 2011-05-30 DIAGNOSIS — Z954 Presence of other heart-valve replacement: Secondary | ICD-10-CM

## 2011-05-30 NOTE — Assessment & Plan Note (Signed)
She is due for followup of this in April. I will schedule this. I reviewed the previous results.

## 2011-05-30 NOTE — Assessment & Plan Note (Signed)
The blood pressure is at target. No change in medications is indicated. We will continue with therapeutic lifestyle changes (TLC).  

## 2011-05-30 NOTE — Assessment & Plan Note (Signed)
I will check an echocardiogram this spring to followup her aortic stenosis. Her murmur is probably slightly louder than it was previously.

## 2011-05-30 NOTE — Progress Notes (Signed)
   HPI The patient presents for followup after aortic valve replacement. She's done well since this other than having to have a revision of her sternal wires. Since having these revise she's not had any wound pain. She feels much better than she did prior to surgery. She's not having any shortness of breath, PND or orthopnea. She's not having any chest pressure, neck or arm discomfort. She denies any palpitations, presyncope or syncope. She has no weight gain or edema. She is active with a strenuous job.  Allergies  Allergen Reactions  . Codeine   . Penicillins   . Sulfonamide Derivatives     Current Outpatient Prescriptions  Medication Sig Dispense Refill  . ALPRAZolam (XANAX) 0.25 MG tablet Take 0.25 mg by mouth at bedtime as needed.        Marland Kitchen aspirin 81 MG tablet Take 81 mg by mouth daily.        . fenofibrate 160 MG tablet Take 160 mg by mouth daily.        Marland Kitchen lisinopril (PRINIVIL,ZESTRIL) 20 MG tablet Take 20 mg by mouth daily.        . metFORMIN (GLUCOPHAGE) 500 MG tablet 2 tabs po bid       . Multiple Vitamin (MULTIVITAMIN) capsule Take 1 capsule by mouth daily.        . pravastatin (PRAVACHOL) 40 MG tablet Take 40 mg by mouth daily.          Past Medical History  Diagnosis Date  . STENOSIS, MITRAL AND AORTIC VALVES   . PERIPHERAL VASCULAR DISEASE   . HYPERTENSION, MILD   . HYPERCHOLESTEROLEMIA  IIA   . FIBRILLATION, ATRIAL   . CAROTID STENOSIS   . ABDOMINAL AORTIC ANEURYSM   . MURMUR     Past Surgical History  Procedure Date  . Aortic valve replacement   . Total abdominal hysterectomy     ROS:  As stated in the HPI and negative for all other systems.  PHYSICAL EXAM BP 130/66  Pulse 72  Ht 5\' 5"  (1.651 m)  Wt 124 lb (56.246 kg)  BMI 20.63 kg/m2 GENERAL:  Well appearing HEENT:  Pupils equal round and reactive, fundi not visualized, oral mucosa unremarkable NECK:  No jugular venous distention, waveform within normal limits, carotid upstroke brisk and symmetric,  bilateral bruits, no thyromegaly LYMPHATICS:  No cervical, inguinal adenopathy LUNGS:  Clear to auscultation bilaterally BACK:  No CVA tenderness CHEST: Well healed sternotomy scar. HEART:  PMI not displaced or sustained,S1 and S2 within normal limits, no S3, no S4, no clicks, no rubs, apical systolic murmur radiating out the aortic outflow tract.   ABD:  Flat, positive bowel sounds normal in frequency in pitch, no bruits, no rebound, no guarding, no midline pulsatile mass, no hepatomegaly, no splenomegaly EXT:  2 plus pulses throughout, no edema, no cyanosis no clubbing SKIN:  No rashes no nodules NEURO:  Cranial nerves II through XII grossly intact, motor grossly intact throughout PSYCH:  Cognitively intact, oriented to person place and time  EKG:  Sinus rhythm, rate 72, axis within normal limits, intervals within normal limits, nonspecific anterolateral T-wave flattening. 05/30/2011   ASSESSMENT AND PLAN

## 2011-05-30 NOTE — Patient Instructions (Signed)
The current medical regimen is effective;  continue present plan and medications.  Your physician has requested that you have an echocardiogram and abdominal ultrasound in 09/2011. Echocardiography is a painless test that uses sound waves to create images of your heart. It provides your doctor with information about the size and shape of your heart and how well your heart's chambers and valves are working. This procedure takes approximately one hour. There are no restrictions for this procedure.  Your physician has requested that you have a carotid duplex in December 2013. This test is an ultrasound of the carotid arteries in your neck. It looks at blood flow through these arteries that supply the brain with blood. Allow one hour for this exam. There are no restrictions or special instructions.  Follow up in 1 year with Dr Percival Spanish.  You will receive a letter in the mail 2 months before you are due.  Please call us when you receive this letter to schedule your follow up appointment.

## 2011-05-30 NOTE — Assessment & Plan Note (Signed)
She had mild carotid stenosis in 2011. This is due for followup in December 2013 and I will make sure this happens.

## 2011-10-21 ENCOUNTER — Other Ambulatory Visit (HOSPITAL_COMMUNITY): Payer: Medicare Other

## 2011-10-21 ENCOUNTER — Other Ambulatory Visit: Payer: Self-pay

## 2011-10-21 ENCOUNTER — Ambulatory Visit (HOSPITAL_COMMUNITY): Payer: Medicare Other | Attending: Cardiovascular Disease

## 2011-10-21 DIAGNOSIS — I359 Nonrheumatic aortic valve disorder, unspecified: Secondary | ICD-10-CM | POA: Insufficient documentation

## 2011-10-21 DIAGNOSIS — E7800 Pure hypercholesterolemia, unspecified: Secondary | ICD-10-CM | POA: Insufficient documentation

## 2011-10-21 DIAGNOSIS — Z954 Presence of other heart-valve replacement: Secondary | ICD-10-CM

## 2011-10-21 DIAGNOSIS — I10 Essential (primary) hypertension: Secondary | ICD-10-CM | POA: Insufficient documentation

## 2011-10-26 ENCOUNTER — Encounter (INDEPENDENT_AMBULATORY_CARE_PROVIDER_SITE_OTHER): Payer: Medicare Other

## 2011-10-26 DIAGNOSIS — I714 Abdominal aortic aneurysm, without rupture, unspecified: Secondary | ICD-10-CM

## 2012-01-09 ENCOUNTER — Encounter: Payer: Self-pay | Admitting: Cardiology

## 2012-03-26 ENCOUNTER — Encounter (HOSPITAL_COMMUNITY)
Admission: RE | Admit: 2012-03-26 | Discharge: 2012-03-26 | Disposition: A | Discharge status: Home / Self Care | Payer: Medicare Other | Source: Ambulatory Visit | Attending: Internal Medicine | Admitting: Internal Medicine

## 2012-03-26 DIAGNOSIS — M810 Age-related osteoporosis without current pathological fracture: Secondary | ICD-10-CM | POA: Insufficient documentation

## 2012-03-26 MED ORDER — ZOLEDRONIC ACID 5 MG/100ML IV SOLN
5.0000 mg | Freq: Once | INTRAVENOUS | Status: AC
  Administered 2012-03-26: 5 mg via INTRAVENOUS

## 2012-03-26 MED ORDER — ZOLEDRONIC ACID 5 MG/100ML IV SOLN
INTRAVENOUS | Status: AC
  Filled 2012-03-26: qty 100

## 2012-05-25 ENCOUNTER — Other Ambulatory Visit: Payer: Self-pay | Admitting: Internal Medicine

## 2012-05-25 DIAGNOSIS — Z1231 Encounter for screening mammogram for malignant neoplasm of breast: Secondary | ICD-10-CM

## 2012-05-29 ENCOUNTER — Ambulatory Visit: Payer: Medicare Other | Admitting: Cardiology

## 2012-05-31 ENCOUNTER — Encounter (INDEPENDENT_AMBULATORY_CARE_PROVIDER_SITE_OTHER): Payer: Medicare Other

## 2012-05-31 DIAGNOSIS — I6529 Occlusion and stenosis of unspecified carotid artery: Secondary | ICD-10-CM

## 2012-06-01 ENCOUNTER — Encounter: Payer: Self-pay | Admitting: Cardiology

## 2012-06-01 ENCOUNTER — Ambulatory Visit (INDEPENDENT_AMBULATORY_CARE_PROVIDER_SITE_OTHER): Payer: Medicare Other | Admitting: Cardiology

## 2012-06-01 VITALS — BP 168/77 | HR 60 | Ht 65.0 in | Wt 124.0 lb

## 2012-06-01 DIAGNOSIS — Z954 Presence of other heart-valve replacement: Secondary | ICD-10-CM

## 2012-06-01 DIAGNOSIS — I4891 Unspecified atrial fibrillation: Secondary | ICD-10-CM

## 2012-06-01 DIAGNOSIS — I10 Essential (primary) hypertension: Secondary | ICD-10-CM

## 2012-06-01 DIAGNOSIS — I714 Abdominal aortic aneurysm, without rupture, unspecified: Secondary | ICD-10-CM

## 2012-06-01 NOTE — Progress Notes (Signed)
HPI The patient presents for followup after aortic valve replacement. Today is the three-year anniversary of her husband's death so she is somewhat sad and stressed.  She says that since had seen her she's actually done well. She remains very reactive at work. The patient denies any new symptoms such as chest discomfort, neck or arm discomfort. There has been no new shortness of breath, PND or orthopnea. There have been no reported palpitations, presyncope or syncope.  Allergies  Allergen Reactions  . Codeine   . Penicillins   . Sulfonamide Derivatives     Current Outpatient Prescriptions  Medication Sig Dispense Refill  . ALPRAZolam (XANAX) 0.25 MG tablet Take 0.25 mg by mouth at bedtime as needed.        Marland Kitchen aspirin 81 MG tablet Take 81 mg by mouth daily.        . Cholecalciferol (VITAMIN D-3) 1000 UNITS CAPS Take 1,000 Units by mouth daily.      . fenofibrate 160 MG tablet Take 160 mg by mouth daily.        Marland Kitchen lisinopril (PRINIVIL,ZESTRIL) 20 MG tablet Take 20 mg by mouth daily.        . metFORMIN (GLUCOPHAGE) 500 MG tablet 2 tabs po bid       . Multiple Vitamin (MULTIVITAMIN) capsule Take 1 capsule by mouth daily.        . pravastatin (PRAVACHOL) 40 MG tablet Take 40 mg by mouth daily.          Past Medical History  Diagnosis Date  . STENOSIS, MITRAL AND AORTIC VALVES   . PERIPHERAL VASCULAR DISEASE   . HYPERTENSION, MILD   . HYPERCHOLESTEROLEMIA  IIA   . FIBRILLATION, ATRIAL   . CAROTID STENOSIS   . ABDOMINAL AORTIC ANEURYSM   . MURMUR     Past Surgical History  Procedure Date  . Aortic valve replacement   . Total abdominal hysterectomy     ROS:  As stated in the HPI and negative for all other systems.  PHYSICAL EXAM BP 168/77  Pulse 60  Ht 5\' 5"  (1.651 m)  Wt 124 lb (56.246 kg)  BMI 20.63 kg/m2 GENERAL:  Well appearing NECK:  No jugular venous distention, waveform within normal limits, carotid upstroke brisk and symmetric, bilateral bruits, no  thyromegaly LYMPHATICS:  No cervical, inguinal adenopathy LUNGS:  Clear to auscultation bilaterally BACK:  No CVA tenderness CHEST: Well healed sternotomy scar. HEART:  PMI not displaced or sustained,S1 and S2 within normal limits, no S3, no S4, no clicks, no rubs, apical systolic murmur radiating out the aortic outflow tract.   ABD:  Flat, positive bowel sounds normal in frequency in pitch, no bruits, no rebound, no guarding, no midline pulsatile mass, no hepatomegaly, no splenomegaly EXT:  2 plus pulses throughout except for diminished left radial, no edema, no cyanosis no clubbing SKIN:  No rashes no nodules NEURO:  Cranial nerves II through XII grossly intact, motor grossly intact throughout PSYCH:  Cognitively intact, oriented to person place and time  EKG:  Sinus rhythm, rate 60, axis within normal limits, intervals within normal limits, nonspecific anterolateral T-wave flattening. 06/01/2012   ASSESSMENT AND PLAN  AORTIC VALVE REPLACEMENT, HX OF - I will do followup echocardiography in May. 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 She's had no new symptoms however.  ABDOMINAL AORTIC ANEURYSM - She is due for followup of this as well in May of 2014.  HYPERTENSION, MILD -  Her  blood  pressure is elevated today and was repeated and verified in both arms. However, she says is well controlled at home. She should keep a strict blood pressure diary and present the results to [place]. She might need further med titration.  TOBACCO USE - She is still occasionally smoking cigarettes and she does not want her to quit completely.

## 2012-06-01 NOTE — Patient Instructions (Addendum)
The current medical regimen is effective;  continue present plan and medications.  You will be contacted to schedule your echocardiogram and abdominal ultrasound in May and your carotid doppler in December.  Please keep a blood pressure diary.  Take your blood pressure while seated.  Wait 5 minutes and repeat.  Do this twice a day for 1 week and call back with results.    Follow up in 1 year with Dr Percival Spanish.  You will receive a letter in the mail 2 months before you are due.  Please call us when you receive this letter to schedule your follow up appointment.'

## 2012-06-22 ENCOUNTER — Ambulatory Visit
Admission: RE | Admit: 2012-06-22 | Discharge: 2012-06-22 | Disposition: A | Discharge status: Home / Self Care | Payer: Medicare Other | Source: Ambulatory Visit | Attending: Internal Medicine | Admitting: Internal Medicine

## 2012-06-22 DIAGNOSIS — Z1231 Encounter for screening mammogram for malignant neoplasm of breast: Secondary | ICD-10-CM

## 2012-11-23 ENCOUNTER — Encounter (INDEPENDENT_AMBULATORY_CARE_PROVIDER_SITE_OTHER): Payer: Medicare Other

## 2012-11-23 DIAGNOSIS — I714 Abdominal aortic aneurysm, without rupture, unspecified: Secondary | ICD-10-CM

## 2013-04-05 ENCOUNTER — Other Ambulatory Visit (HOSPITAL_COMMUNITY): Payer: Self-pay | Admitting: *Deleted

## 2013-04-08 ENCOUNTER — Ambulatory Visit (HOSPITAL_COMMUNITY)
Admission: RE | Admit: 2013-04-08 | Discharge: 2013-04-08 | Disposition: A | Discharge status: Home / Self Care | Payer: Medicare Other | Source: Ambulatory Visit | Attending: Internal Medicine | Admitting: Internal Medicine

## 2013-04-08 DIAGNOSIS — M810 Age-related osteoporosis without current pathological fracture: Secondary | ICD-10-CM | POA: Insufficient documentation

## 2013-04-08 MED ORDER — ZOLEDRONIC ACID 5 MG/100ML IV SOLN
5.0000 mg | Freq: Once | INTRAVENOUS | Status: AC
  Administered 2013-04-08: 5 mg via INTRAVENOUS

## 2013-04-08 MED ORDER — ZOLEDRONIC ACID 5 MG/100ML IV SOLN
INTRAVENOUS | Status: AC
  Administered 2013-04-08: 5 mg via INTRAVENOUS
  Filled 2013-04-08: qty 100

## 2013-05-31 ENCOUNTER — Ambulatory Visit (HOSPITAL_COMMUNITY): Payer: Medicare Other | Attending: Cardiology

## 2013-05-31 DIAGNOSIS — I6529 Occlusion and stenosis of unspecified carotid artery: Secondary | ICD-10-CM | POA: Insufficient documentation

## 2013-06-03 ENCOUNTER — Telehealth: Payer: Self-pay | Admitting: Cardiology

## 2013-06-03 NOTE — Telephone Encounter (Signed)
Follow Up  Pt returned call back for results/SR

## 2013-06-03 NOTE — Telephone Encounter (Signed)
Pt aware of results of carotid dopplers

## 2013-06-11 ENCOUNTER — Encounter: Payer: Self-pay | Admitting: Cardiology

## 2013-06-11 ENCOUNTER — Encounter: Payer: Self-pay | Admitting: *Deleted

## 2013-06-11 ENCOUNTER — Ambulatory Visit (INDEPENDENT_AMBULATORY_CARE_PROVIDER_SITE_OTHER): Payer: Medicare Other | Admitting: Cardiology

## 2013-06-11 VITALS — BP 178/96 | HR 59 | Ht 65.0 in | Wt 124.2 lb

## 2013-06-11 DIAGNOSIS — I4891 Unspecified atrial fibrillation: Secondary | ICD-10-CM

## 2013-06-11 DIAGNOSIS — I080 Rheumatic disorders of both mitral and aortic valves: Secondary | ICD-10-CM

## 2013-06-11 DIAGNOSIS — Z954 Presence of other heart-valve replacement: Secondary | ICD-10-CM

## 2013-06-11 MED ORDER — LISINOPRIL-HYDROCHLOROTHIAZIDE 20-25 MG PO TABS: 1.0000 | ORAL_TABLET | Freq: Every day | ORAL | Status: DC

## 2013-06-11 NOTE — Patient Instructions (Addendum)
Please start Lisinopril Hydrochlorothizide 20-25 mg once a day. Continue all other medications as listed.  Please have blood work in 2 weeks (BMP)  Your physician has requested that you have an echocardiogram. Echocardiography is a painless test that uses sound waves to create images of your heart. It provides your doctor with information about the size and shape of your heart and how well your heart's chambers and valves are working. This procedure takes approximately one hour. There are no restrictions for this procedure.  Follow up in 1 year with Dr Percival Spanish.  You will receive a letter in the mail 2 months before you are due.  Please call us when you receive this letter to schedule your follow up appointment.

## 2013-06-11 NOTE — Progress Notes (Signed)
HPI The patient presents for followup after aortic valve replacement. Since I last saw her she has done well. She is working part time. She denies any cardiovascular symptoms. She has no chest pressure, neck or arm discomfort. She has no palpitations, presyncope or syncope. She has had no PND or orthopnea. She has had no weight gain or edema.   Allergies  Allergen Reactions  . Codeine   . Penicillins   . Sulfonamide Derivatives     Current Outpatient Prescriptions  Medication Sig Dispense Refill  . ALPRAZolam (XANAX) 0.25 MG tablet Take 0.25 mg by mouth at bedtime as needed.        Marland Kitchen aspirin 81 MG tablet Take 81 mg by mouth daily.        . Cholecalciferol (VITAMIN D-3) 1000 UNITS CAPS Take 1,000 Units by mouth daily.      . fenofibrate 160 MG tablet Take 160 mg by mouth daily.        Marland Kitchen lisinopril (PRINIVIL,ZESTRIL) 20 MG tablet Take 20 mg by mouth daily.        . metFORMIN (GLUCOPHAGE) 500 MG tablet 2 tabs in the am and 2 tabs in the pm, total of 4 tablets daily.      . Multiple Vitamin (MULTIVITAMIN) capsule Take 1 capsule by mouth daily.        . pravastatin (PRAVACHOL) 40 MG tablet Take 40 mg by mouth daily.         No current facility-administered medications for this visit.    Past Medical History  Diagnosis Date  . STENOSIS, MITRAL AND AORTIC VALVES   . PERIPHERAL VASCULAR DISEASE   . HYPERTENSION, MILD   . HYPERCHOLESTEROLEMIA  IIA   . FIBRILLATION, ATRIAL   . CAROTID STENOSIS   . ABDOMINAL AORTIC ANEURYSM   . MURMUR     Past Surgical History  Procedure Laterality Date  . Aortic valve replacement    . Total abdominal hysterectomy      ROS:  As stated in the HPI and negative for all other systems.  PHYSICAL EXAM BP 178/96  Pulse 59  Ht 5\' 5"  (1.651 m)  Wt 124 lb 3.2 oz (56.337 kg)  BMI 20.67 kg/m2 GENERAL:  Well appearing NECK:  No jugular venous distention, waveform within normal limits, carotid upstroke brisk and symmetric, bilateral bruits, no  thyromegaly LYMPHATICS:  No cervical, inguinal adenopathy LUNGS:  Clear to auscultation bilaterally BACK:  No CVA tenderness CHEST: Well healed sternotomy scar. HEART:  PMI not displaced or sustained,S1 and S2 within normal limits, no S3, no S4, no clicks, no rubs, apical systolic murmur radiating out the aortic outflow tract.   ABD:  Flat, positive bowel sounds normal in frequency in pitch, no bruits, no rebound, no guarding, no midline pulsatile mass, no hepatomegaly, no splenomegaly EXT:  2 plus pulses throughout except for diminished left radial, no edema, no cyanosis no clubbing SKIN:  No rashes no nodules NEURO:  Cranial nerves II through XII grossly intact, motor grossly intact throughout PSYCH:  Cognitively intact, oriented to person place and time  EKG:  Sinus rhythm, rate 59, axis within normal limits, intervals within normal limits, nonspecific anterolateral T-wave flattening. 06/11/2013   ASSESSMENT AND PLAN  AORTIC VALVE REPLACEMENT, HX OF - I will do followup echocardiography to followup her aortic valve replacement.  ABDOMINAL AORTIC ANEURYSM - This was 3.3 cm earlier this summer. She will have another followup in the summer of 2015.  HYPERTENSION, MILD -  Her blood  pressure consistently has been elevated. I'm going to change to lisinopril HCT 20/25. She will get a basic metabolic profile in about 2 weeks.  TOBACCO USE - She has completely quit smoking

## 2013-06-28 ENCOUNTER — Ambulatory Visit (HOSPITAL_COMMUNITY): Payer: Medicare Other | Attending: Cardiovascular Disease | Admitting: Cardiology

## 2013-06-28 ENCOUNTER — Other Ambulatory Visit (INDEPENDENT_AMBULATORY_CARE_PROVIDER_SITE_OTHER): Payer: Medicare Other

## 2013-06-28 ENCOUNTER — Encounter: Payer: Self-pay | Admitting: Cardiovascular Disease

## 2013-06-28 DIAGNOSIS — E785 Hyperlipidemia, unspecified: Secondary | ICD-10-CM | POA: Insufficient documentation

## 2013-06-28 DIAGNOSIS — I080 Rheumatic disorders of both mitral and aortic valves: Secondary | ICD-10-CM

## 2013-06-28 DIAGNOSIS — I10 Essential (primary) hypertension: Secondary | ICD-10-CM | POA: Insufficient documentation

## 2013-06-28 DIAGNOSIS — Z954 Presence of other heart-valve replacement: Secondary | ICD-10-CM | POA: Insufficient documentation

## 2013-06-28 LAB — BASIC METABOLIC PANEL
BUN: 24 mg/dL — ABNORMAL HIGH (ref 6–23)
CO2: 24 mEq/L (ref 19–32)
Calcium: 9.3 mg/dL (ref 8.4–10.5)
Chloride: 102 mEq/L (ref 96–112)
Creatinine, Ser: 1.6 mg/dL — ABNORMAL HIGH (ref 0.4–1.2)
GFR: 34.52 mL/min — ABNORMAL LOW (ref >60.00)
Glucose, Bld: 99 mg/dL (ref 70–99)
Potassium: 4 mEq/L (ref 3.5–5.1)
Sodium: 137 mEq/L (ref 135–145)

## 2013-06-28 NOTE — Progress Notes (Signed)
Echo performed. 

## 2013-07-08 ENCOUNTER — Other Ambulatory Visit: Payer: Self-pay | Admitting: *Deleted

## 2013-07-08 DIAGNOSIS — N289 Disorder of kidney and ureter, unspecified: Secondary | ICD-10-CM

## 2013-07-31 ENCOUNTER — Other Ambulatory Visit: Payer: Medicare Other

## 2013-08-07 ENCOUNTER — Other Ambulatory Visit (INDEPENDENT_AMBULATORY_CARE_PROVIDER_SITE_OTHER): Payer: Medicare Other

## 2013-08-07 DIAGNOSIS — N289 Disorder of kidney and ureter, unspecified: Secondary | ICD-10-CM

## 2013-08-07 LAB — BASIC METABOLIC PANEL
BUN: 26 mg/dL — ABNORMAL HIGH (ref 6–23)
CO2: 25 mEq/L (ref 19–32)
Calcium: 10.4 mg/dL (ref 8.4–10.5)
Chloride: 102 mEq/L (ref 96–112)
Creatinine, Ser: 2.5 mg/dL — ABNORMAL HIGH (ref 0.4–1.2)
GFR: 20.14 mL/min — ABNORMAL LOW (ref >60.00)
Glucose, Bld: 105 mg/dL — ABNORMAL HIGH (ref 70–99)
Potassium: 4.4 mEq/L (ref 3.5–5.1)
Sodium: 135 mEq/L (ref 135–145)

## 2013-08-12 ENCOUNTER — Other Ambulatory Visit: Payer: Self-pay | Admitting: *Deleted

## 2013-08-12 ENCOUNTER — Other Ambulatory Visit: Payer: Self-pay

## 2013-08-12 DIAGNOSIS — Z1231 Encounter for screening mammogram for malignant neoplasm of breast: Secondary | ICD-10-CM

## 2013-08-12 DIAGNOSIS — N289 Disorder of kidney and ureter, unspecified: Secondary | ICD-10-CM

## 2013-08-22 ENCOUNTER — Ambulatory Visit: Payer: Medicare Other

## 2013-08-27 ENCOUNTER — Other Ambulatory Visit (INDEPENDENT_AMBULATORY_CARE_PROVIDER_SITE_OTHER): Payer: Medicare Other

## 2013-08-27 DIAGNOSIS — N289 Disorder of kidney and ureter, unspecified: Secondary | ICD-10-CM

## 2013-08-27 LAB — BASIC METABOLIC PANEL
BUN: 23 mg/dL (ref 6–23)
CO2: 23 mEq/L (ref 19–32)
Calcium: 9.4 mg/dL (ref 8.4–10.5)
Chloride: 102 mEq/L (ref 96–112)
Creatinine, Ser: 2 mg/dL — ABNORMAL HIGH (ref 0.4–1.2)
GFR: 26.44 mL/min — ABNORMAL LOW (ref >60.00)
Glucose, Bld: 100 mg/dL — ABNORMAL HIGH (ref 70–99)
Potassium: 4.1 mEq/L (ref 3.5–5.1)
Sodium: 135 mEq/L (ref 135–145)

## 2013-09-03 ENCOUNTER — Ambulatory Visit
Admission: RE | Admit: 2013-09-03 | Discharge: 2013-09-03 | Disposition: A | Discharge status: Home / Self Care | Payer: Medicare Other | Source: Ambulatory Visit

## 2013-09-03 DIAGNOSIS — Z1231 Encounter for screening mammogram for malignant neoplasm of breast: Secondary | ICD-10-CM

## 2013-12-09 ENCOUNTER — Other Ambulatory Visit (HOSPITAL_COMMUNITY): Payer: Self-pay | Admitting: Cardiology

## 2013-12-09 DIAGNOSIS — I714 Abdominal aortic aneurysm, without rupture, unspecified: Secondary | ICD-10-CM

## 2013-12-20 ENCOUNTER — Ambulatory Visit (HOSPITAL_COMMUNITY): Payer: Medicare Other | Attending: Cardiology | Admitting: Cardiology

## 2013-12-20 DIAGNOSIS — I714 Abdominal aortic aneurysm, without rupture, unspecified: Secondary | ICD-10-CM

## 2013-12-20 DIAGNOSIS — I2510 Atherosclerotic heart disease of native coronary artery without angina pectoris: Secondary | ICD-10-CM | POA: Insufficient documentation

## 2013-12-20 DIAGNOSIS — I739 Peripheral vascular disease, unspecified: Secondary | ICD-10-CM | POA: Insufficient documentation

## 2013-12-20 DIAGNOSIS — Z87891 Personal history of nicotine dependence: Secondary | ICD-10-CM | POA: Insufficient documentation

## 2013-12-20 DIAGNOSIS — E119 Type 2 diabetes mellitus without complications: Secondary | ICD-10-CM | POA: Insufficient documentation

## 2013-12-20 DIAGNOSIS — I10 Essential (primary) hypertension: Secondary | ICD-10-CM | POA: Insufficient documentation

## 2013-12-20 DIAGNOSIS — I700 Atherosclerosis of aorta: Secondary | ICD-10-CM

## 2013-12-20 DIAGNOSIS — E785 Hyperlipidemia, unspecified: Secondary | ICD-10-CM | POA: Insufficient documentation

## 2013-12-20 NOTE — Progress Notes (Signed)
Aorta duplex performed

## 2014-02-21 ENCOUNTER — Encounter: Payer: Self-pay | Admitting: Gastroenterology

## 2014-04-10 ENCOUNTER — Other Ambulatory Visit (HOSPITAL_COMMUNITY): Payer: Self-pay | Admitting: *Deleted

## 2014-04-10 ENCOUNTER — Encounter (HOSPITAL_COMMUNITY): Payer: Medicare Other

## 2014-04-11 ENCOUNTER — Encounter (HOSPITAL_COMMUNITY)
Admission: RE | Admit: 2014-04-11 | Discharge: 2014-04-11 | Disposition: A | Discharge status: Home / Self Care | Payer: Medicare Other | Source: Ambulatory Visit | Attending: Internal Medicine | Admitting: Internal Medicine

## 2014-04-11 DIAGNOSIS — M810 Age-related osteoporosis without current pathological fracture: Secondary | ICD-10-CM | POA: Insufficient documentation

## 2014-04-11 DIAGNOSIS — Z5181 Encounter for therapeutic drug level monitoring: Secondary | ICD-10-CM | POA: Diagnosis not present

## 2014-04-11 MED ORDER — ZOLEDRONIC ACID 5 MG/100ML IV SOLN
5.0000 mg | Freq: Once | INTRAVENOUS | Status: AC
  Administered 2014-04-11: 5 mg via INTRAVENOUS

## 2014-04-11 MED ORDER — ZOLEDRONIC ACID 5 MG/100ML IV SOLN
INTRAVENOUS | Status: AC
  Filled 2014-04-11: qty 100

## 2014-05-29 ENCOUNTER — Other Ambulatory Visit (HOSPITAL_COMMUNITY): Payer: Self-pay | Admitting: *Deleted

## 2014-05-29 DIAGNOSIS — I6523 Occlusion and stenosis of bilateral carotid arteries: Secondary | ICD-10-CM

## 2014-06-04 ENCOUNTER — Ambulatory Visit (INDEPENDENT_AMBULATORY_CARE_PROVIDER_SITE_OTHER): Payer: Medicare Other | Admitting: Cardiology

## 2014-06-04 ENCOUNTER — Encounter: Payer: Self-pay | Admitting: Cardiology

## 2014-06-04 VITALS — BP 140/60 | Ht 65.0 in | Wt 123.4 lb

## 2014-06-04 DIAGNOSIS — I059 Rheumatic mitral valve disease, unspecified: Secondary | ICD-10-CM

## 2014-06-04 DIAGNOSIS — I739 Peripheral vascular disease, unspecified: Secondary | ICD-10-CM

## 2014-06-04 NOTE — Progress Notes (Signed)
HPI The patient presents for followup after aortic valve replacement. Since I last saw her she has done well. She is still working. She denies any cardiovascular symptoms. She has no chest pressure, neck or arm discomfort. She has no palpitations, presyncope or syncope. She has had no PND or orthopnea. She has had no weight gain or edema.     Allergies  Allergen Reactions  . Codeine   . Penicillins   . Sulfonamide Derivatives     Current Outpatient Prescriptions  Medication Sig Dispense Refill  . ALPRAZolam (XANAX) 0.25 MG tablet Take 0.25 mg by mouth at bedtime as needed.      Marland Kitchen aspirin 81 MG tablet Take 81 mg by mouth daily.      . Cholecalciferol (VITAMIN D-3) 1000 UNITS CAPS Take 1,000 Units by mouth daily.    . fenofibrate 160 MG tablet Take 160 mg by mouth daily.      Marland Kitchen lisinopril-hydrochlorothiazide (PRINZIDE,ZESTORETIC) 20-25 MG per tablet Take 1 tablet by mouth daily. 30 tablet 11  . metFORMIN (GLUCOPHAGE) 500 MG tablet 2 tabs in the am and 2 tabs in the pm, total of 4 tablets daily.    . Multiple Vitamin (MULTIVITAMIN) capsule Take 1 capsule by mouth daily.      . pravastatin (PRAVACHOL) 40 MG tablet Take 40 mg by mouth daily.       No current facility-administered medications for this visit.    Past Medical History  Diagnosis Date  . STENOSIS, MITRAL AND AORTIC VALVES   . PERIPHERAL VASCULAR DISEASE   . HYPERTENSION, MILD   . HYPERCHOLESTEROLEMIA  IIA   . FIBRILLATION, ATRIAL   . CAROTID STENOSIS   . ABDOMINAL AORTIC ANEURYSM   . MURMUR     Past Surgical History  Procedure Laterality Date  . Aortic valve replacement    . Total abdominal hysterectomy      ROS:  As stated in the HPI and negative for all other systems.  PHYSICAL EXAM Ht 5\' 5"  (1.651 m)  Wt 123 lb 6.4 oz (55.974 kg)  BMI 20.53 kg/m2 GENERAL:  Well appearing NECK:  No jugular venous distention, waveform within normal limits, carotid upstroke brisk and symmetric, bilateral bruits, no  thyromegaly LYMPHATICS:  No cervical, inguinal adenopathy LUNGS:  Clear to auscultation bilaterally BACK:  No CVA tenderness CHEST: Well healed sternotomy scar. HEART:  PMI not displaced or sustained,S1 and S2 within normal limits, no S3, no S4, no clicks, no rubs, apical systolic murmur radiating out the aortic outflow tract.   ABD:  Flat, positive bowel sounds normal in frequency in pitch, no bruits, no rebound, no guarding, no midline pulsatile mass, no hepatomegaly, no splenomegaly EXT:  2 plus pulses throughout except for diminished left radial, no edema, no cyanosis no clubbing SKIN:  No rashes no nodules NEURO:  Cranial nerves II through XII grossly intact, motor grossly intact throughout PSYCH:  Cognitively intact, oriented to person place and time  EKG:  Sinus rhythm, rate 72, axis within normal limits, intervals within normal limits, nonspecific anterolateral T-wave flattening.  No change from previous. 06/04/2014   ASSESSMENT AND PLAN  AORTIC VALVE REPLACEMENT, HX OF - She has had no suggestion clinically that this has progressed. No further imaging is indicated at this time.  I will followup echocardiogram next year when I see her.  ABDOMINAL AORTIC ANEURYSM - This was2.7cm earlier this summer. She will have another followup in the summer of 2016.  HYPERTENSION, MILD -  The blood  pressure is at target. No change in medications is indicated. We will continue with therapeutic lifestyle changes (TLC).  TOBACCO USE - She has completely quit smoking by her report.

## 2014-06-04 NOTE — Patient Instructions (Signed)
Your physician recommends that you schedule a follow-up appointment in: one year with Dr. Percival Spanish and we will do an Echo that same day just prior to the appt.

## 2014-06-09 ENCOUNTER — Encounter (HOSPITAL_COMMUNITY): Payer: Medicare Other

## 2015-04-15 ENCOUNTER — Other Ambulatory Visit (HOSPITAL_COMMUNITY): Payer: Self-pay | Admitting: *Deleted

## 2015-04-16 ENCOUNTER — Encounter (HOSPITAL_COMMUNITY): Payer: Medicare Other

## 2015-04-21 ENCOUNTER — Other Ambulatory Visit: Payer: Self-pay | Admitting: Internal Medicine

## 2015-04-21 ENCOUNTER — Other Ambulatory Visit (HOSPITAL_COMMUNITY): Payer: Self-pay | Admitting: Internal Medicine

## 2015-04-21 DIAGNOSIS — I714 Abdominal aortic aneurysm, without rupture, unspecified: Secondary | ICD-10-CM

## 2015-04-21 DIAGNOSIS — I779 Disorder of arteries and arterioles, unspecified: Secondary | ICD-10-CM

## 2015-04-21 DIAGNOSIS — I739 Peripheral vascular disease, unspecified: Secondary | ICD-10-CM

## 2015-04-22 ENCOUNTER — Other Ambulatory Visit (HOSPITAL_COMMUNITY): Payer: Self-pay | Admitting: Internal Medicine

## 2015-04-22 ENCOUNTER — Other Ambulatory Visit: Payer: Self-pay

## 2015-04-22 DIAGNOSIS — I6523 Occlusion and stenosis of bilateral carotid arteries: Secondary | ICD-10-CM

## 2015-04-22 DIAGNOSIS — I714 Abdominal aortic aneurysm, without rupture, unspecified: Secondary | ICD-10-CM

## 2015-04-22 DIAGNOSIS — Z1231 Encounter for screening mammogram for malignant neoplasm of breast: Secondary | ICD-10-CM

## 2015-05-01 ENCOUNTER — Encounter (HOSPITAL_COMMUNITY)
Admission: RE | Admit: 2015-05-01 | Discharge: 2015-05-01 | Disposition: A | Discharge status: Home / Self Care | Payer: Medicare Other | Source: Ambulatory Visit | Attending: Internal Medicine | Admitting: Internal Medicine

## 2015-05-01 DIAGNOSIS — M810 Age-related osteoporosis without current pathological fracture: Secondary | ICD-10-CM | POA: Diagnosis not present

## 2015-05-01 MED ORDER — SODIUM CHLORIDE 0.9 % IV SOLN
Freq: Once | INTRAVENOUS | Status: AC
  Administered 2015-05-01: 09:00:00 via INTRAVENOUS

## 2015-05-01 MED ORDER — ZOLEDRONIC ACID 5 MG/100ML IV SOLN: 5.0000 mg | Freq: Once | INTRAVENOUS | Status: DC

## 2015-05-01 MED ORDER — ZOLEDRONIC ACID 5 MG/100ML IV SOLN
INTRAVENOUS | Status: AC
  Administered 2015-05-01: 5 mg
  Filled 2015-05-01: qty 100

## 2015-05-05 ENCOUNTER — Ambulatory Visit (HOSPITAL_COMMUNITY)
Admission: RE | Admit: 2015-05-05 | Discharge: 2015-05-05 | Disposition: A | Discharge status: Home / Self Care | Payer: Medicare Other | Source: Ambulatory Visit | Attending: Cardiovascular Disease | Admitting: Cardiovascular Disease

## 2015-05-05 DIAGNOSIS — I714 Abdominal aortic aneurysm, without rupture, unspecified: Secondary | ICD-10-CM

## 2015-05-05 DIAGNOSIS — I10 Essential (primary) hypertension: Secondary | ICD-10-CM | POA: Diagnosis not present

## 2015-05-05 DIAGNOSIS — E7800 Pure hypercholesterolemia, unspecified: Secondary | ICD-10-CM | POA: Insufficient documentation

## 2015-05-05 DIAGNOSIS — I700 Atherosclerosis of aorta: Secondary | ICD-10-CM | POA: Insufficient documentation

## 2015-05-05 DIAGNOSIS — I6523 Occlusion and stenosis of bilateral carotid arteries: Secondary | ICD-10-CM

## 2015-05-05 DIAGNOSIS — I708 Atherosclerosis of other arteries: Secondary | ICD-10-CM | POA: Insufficient documentation

## 2015-05-14 ENCOUNTER — Ambulatory Visit (HOSPITAL_COMMUNITY): Payer: Medicare Other | Attending: Internal Medicine

## 2015-05-14 ENCOUNTER — Other Ambulatory Visit: Payer: Self-pay

## 2015-05-14 DIAGNOSIS — I739 Peripheral vascular disease, unspecified: Secondary | ICD-10-CM

## 2015-05-14 DIAGNOSIS — Z953 Presence of xenogenic heart valve: Secondary | ICD-10-CM | POA: Insufficient documentation

## 2015-05-14 DIAGNOSIS — I359 Nonrheumatic aortic valve disorder, unspecified: Secondary | ICD-10-CM | POA: Insufficient documentation

## 2015-05-14 DIAGNOSIS — I059 Rheumatic mitral valve disease, unspecified: Secondary | ICD-10-CM

## 2015-05-14 DIAGNOSIS — E7800 Pure hypercholesterolemia, unspecified: Secondary | ICD-10-CM | POA: Insufficient documentation

## 2015-05-14 DIAGNOSIS — I10 Essential (primary) hypertension: Secondary | ICD-10-CM | POA: Insufficient documentation

## 2015-05-14 DIAGNOSIS — I340 Nonrheumatic mitral (valve) insufficiency: Secondary | ICD-10-CM | POA: Insufficient documentation

## 2015-05-14 DIAGNOSIS — I509 Heart failure, unspecified: Secondary | ICD-10-CM | POA: Diagnosis present

## 2015-05-28 ENCOUNTER — Encounter: Payer: Self-pay | Admitting: Cardiology

## 2015-05-28 ENCOUNTER — Ambulatory Visit (INDEPENDENT_AMBULATORY_CARE_PROVIDER_SITE_OTHER): Payer: Medicare Other | Admitting: Cardiology

## 2015-05-28 VITALS — BP 146/64 | HR 65 | Ht 65.0 in | Wt 118.4 lb

## 2015-05-28 DIAGNOSIS — I6523 Occlusion and stenosis of bilateral carotid arteries: Secondary | ICD-10-CM | POA: Diagnosis not present

## 2015-05-28 DIAGNOSIS — I714 Abdominal aortic aneurysm, without rupture, unspecified: Secondary | ICD-10-CM

## 2015-05-28 DIAGNOSIS — I480 Paroxysmal atrial fibrillation: Secondary | ICD-10-CM

## 2015-05-28 DIAGNOSIS — I729 Aneurysm of unspecified site: Secondary | ICD-10-CM | POA: Diagnosis not present

## 2015-05-28 NOTE — Progress Notes (Signed)
HPI The patient presents for followup after aortic valve replacement. Since I last saw her she has done well. She is still working. She denies any cardiovascular symptoms. She has no chest pressure, neck or arm discomfort. She has no palpitations, presyncope or syncope. She has had no PND or orthopnea. She has had no weight gain or edema.   I sent her for an echo recently. She does have some mild aortic stenosis. Gradient is 21 with a previous routine being 18. She actually feels well.     Allergies  Allergen Reactions  . Codeine   . Penicillins   . Sulfonamide Derivatives     Current Outpatient Prescriptions  Medication Sig Dispense Refill  . ALPRAZolam (XANAX) 0.25 MG tablet Take 0.25 mg by mouth at bedtime as needed.      Marland Kitchen aspirin 81 MG tablet Take 81 mg by mouth daily.      . Cholecalciferol (VITAMIN D-3) 1000 UNITS CAPS Take 1,000 Units by mouth daily.    . fenofibrate 160 MG tablet Take 160 mg by mouth daily.      Marland Kitchen lisinopril-hydrochlorothiazide (PRINZIDE,ZESTORETIC) 20-25 MG per tablet Take 1 tablet by mouth daily. 30 tablet 11  . metFORMIN (GLUCOPHAGE) 500 MG tablet 2 tabs in the am and 2 tabs in the pm, total of 4 tablets daily.    . metFORMIN (GLUCOPHAGE-XR) 500 MG 24 hr tablet Take 500 mg by mouth 2 (two) times daily.    . Multiple Vitamin (MULTIVITAMIN) capsule Take 1 capsule by mouth daily.      . pravastatin (PRAVACHOL) 40 MG tablet Take 40 mg by mouth daily.       No current facility-administered medications for this visit.    Past Medical History  Diagnosis Date  . STENOSIS, MITRAL AND AORTIC VALVES   . PERIPHERAL VASCULAR DISEASE   . HYPERTENSION, MILD   . HYPERCHOLESTEROLEMIA  IIA   . FIBRILLATION, ATRIAL   . CAROTID STENOSIS   . ABDOMINAL AORTIC ANEURYSM   . MURMUR     Past Surgical History  Procedure Laterality Date  . Aortic valve replacement    . Total abdominal hysterectomy      ROS:  As stated in the HPI and negative for all other  systems.  PHYSICAL EXAM BP 146/64 mmHg  Pulse 65  Ht 5\' 5"  (1.651 m)  Wt 118 lb 6.4 oz (53.706 kg)  BMI 19.70 kg/m2 GENERAL:  Well appearing NECK:  No jugular venous distention, waveform within normal limits, carotid upstroke brisk and symmetric, bilateral bruits, no thyromegaly LYMPHATICS:  No cervical, inguinal adenopathy LUNGS:  Clear to auscultation bilaterally BACK:  No CVA tenderness CHEST: Well healed sternotomy scar. HEART:  PMI not displaced or sustained,S1 and S2 within normal limits, no S3, no S4, no clicks, no rubs, apical systolic murmur radiating out the aortic outflow tract.   ABD:  Flat, positive bowel sounds normal in frequency in pitch, no bruits, no rebound, no guarding, no midline pulsatile mass, no hepatomegaly, no splenomegaly EXT:  2 plus pulses throughout except for diminished left radial, no edema, no cyanosis no clubbing SKIN:  No rashes no nodules   EKG:  Sinus rhythm, rate 65, axis within normal limits, intervals within normal limits, nonspecific anterolateral T-wave flattening.  No change from previous. 05/28/2015   ASSESSMENT AND PLAN  AORTIC VALVE REPLACEMENT, HX OF - She has had no suggestion clinically that this has progressed. No further imaging is indicated at this time.  I reviewed the  results from the recent echo with her and I will  Follow this clinically  ABDOMINAL AORTIC ANEURYSM - This was 2.7cm earlier this summer. She will have another followup in the summer of 2017.  DYSLIPIDEMIA -  This is followed by Tivis Ringer, MD.   I will defer his management might suggest discontinuing the fenofibrate as suggested by the FDA if the only indication for the fenofibrate was mild triglycerides or reduced HDL  HYPERTENSION, MILD -  The blood pressure is at target. No change in medications is indicated. We will continue with therapeutic lifestyle changes (TLC).  TOBACCO USE - She says that she has completely quit smoking.   CAROTID STENOSIS  -  She has moderate carotid stenosis and we will follow this up in July of next year as well.

## 2015-05-28 NOTE — Patient Instructions (Signed)
Your physician wants you to follow-up in: 1 Year. You will receive a reminder letter in the mail two months in advance. If you don't receive a letter, please call our office to schedule the follow-up appointment.  Your physician has requested that you have a carotid duplex. This test is an ultrasound of the carotid arteries in your neck. It looks at blood flow through these arteries that supply the brain with blood. Allow one hour for this exam. There are no restrictions or special instructions.  Your physician has requested that you have an abdominal aorta duplex. During this test, an ultrasound is used to evaluate the aorta. Allow 30 minutes for this exam. Do not eat after midnight the day before and avoid carbonated beverages  Merry Christmas and [place]!!

## 2015-06-09 ENCOUNTER — Other Ambulatory Visit: Payer: Self-pay | Admitting: Internal Medicine

## 2015-06-09 DIAGNOSIS — Z1231 Encounter for screening mammogram for malignant neoplasm of breast: Secondary | ICD-10-CM

## 2015-06-17 DIAGNOSIS — Z6820 Body mass index (BMI) 20.0-20.9, adult: Secondary | ICD-10-CM | POA: Diagnosis not present

## 2015-06-17 DIAGNOSIS — M810 Age-related osteoporosis without current pathological fracture: Secondary | ICD-10-CM | POA: Diagnosis not present

## 2015-09-11 DIAGNOSIS — E119 Type 2 diabetes mellitus without complications: Secondary | ICD-10-CM | POA: Diagnosis not present

## 2015-09-11 DIAGNOSIS — Z6820 Body mass index (BMI) 20.0-20.9, adult: Secondary | ICD-10-CM | POA: Diagnosis not present

## 2015-09-11 DIAGNOSIS — M810 Age-related osteoporosis without current pathological fracture: Secondary | ICD-10-CM | POA: Diagnosis not present

## 2015-09-11 DIAGNOSIS — I6529 Occlusion and stenosis of unspecified carotid artery: Secondary | ICD-10-CM | POA: Diagnosis not present

## 2015-09-11 DIAGNOSIS — E782 Mixed hyperlipidemia: Secondary | ICD-10-CM | POA: Diagnosis not present

## 2015-09-11 DIAGNOSIS — I10 Essential (primary) hypertension: Secondary | ICD-10-CM | POA: Diagnosis not present

## 2015-09-11 DIAGNOSIS — Z952 Presence of prosthetic heart valve: Secondary | ICD-10-CM | POA: Diagnosis not present

## 2015-09-11 DIAGNOSIS — I714 Abdominal aortic aneurysm, without rupture: Secondary | ICD-10-CM | POA: Diagnosis not present

## 2015-12-22 DIAGNOSIS — E782 Mixed hyperlipidemia: Secondary | ICD-10-CM | POA: Diagnosis not present

## 2015-12-30 DIAGNOSIS — N183 Chronic kidney disease, stage 3 (moderate): Secondary | ICD-10-CM | POA: Diagnosis not present

## 2015-12-30 DIAGNOSIS — I10 Essential (primary) hypertension: Secondary | ICD-10-CM | POA: Diagnosis not present

## 2015-12-30 DIAGNOSIS — E1059 Type 1 diabetes mellitus with other circulatory complications: Secondary | ICD-10-CM | POA: Diagnosis not present

## 2015-12-30 DIAGNOSIS — E782 Mixed hyperlipidemia: Secondary | ICD-10-CM | POA: Diagnosis not present

## 2015-12-30 DIAGNOSIS — J302 Other seasonal allergic rhinitis: Secondary | ICD-10-CM | POA: Diagnosis not present

## 2015-12-30 DIAGNOSIS — Z1389 Encounter for screening for other disorder: Secondary | ICD-10-CM | POA: Diagnosis not present

## 2015-12-30 DIAGNOSIS — Z952 Presence of prosthetic heart valve: Secondary | ICD-10-CM | POA: Diagnosis not present

## 2016-04-22 DIAGNOSIS — E782 Mixed hyperlipidemia: Secondary | ICD-10-CM | POA: Diagnosis not present

## 2016-04-22 DIAGNOSIS — N390 Urinary tract infection, site not specified: Secondary | ICD-10-CM | POA: Diagnosis not present

## 2016-04-22 DIAGNOSIS — E1059 Type 1 diabetes mellitus with other circulatory complications: Secondary | ICD-10-CM | POA: Diagnosis not present

## 2016-04-22 DIAGNOSIS — M810 Age-related osteoporosis without current pathological fracture: Secondary | ICD-10-CM | POA: Diagnosis not present

## 2016-04-22 DIAGNOSIS — I10 Essential (primary) hypertension: Secondary | ICD-10-CM | POA: Diagnosis not present

## 2016-04-22 DIAGNOSIS — R8299 Other abnormal findings in urine: Secondary | ICD-10-CM | POA: Diagnosis not present

## 2016-04-23 DIAGNOSIS — Z23 Encounter for immunization: Secondary | ICD-10-CM | POA: Diagnosis not present

## 2016-04-26 DIAGNOSIS — E785 Hyperlipidemia, unspecified: Secondary | ICD-10-CM | POA: Diagnosis not present

## 2016-04-26 DIAGNOSIS — Z6820 Body mass index (BMI) 20.0-20.9, adult: Secondary | ICD-10-CM | POA: Diagnosis not present

## 2016-04-26 DIAGNOSIS — Z0000 Encounter for general adult medical examination without abnormal findings: Secondary | ICD-10-CM | POA: Diagnosis not present

## 2016-04-26 DIAGNOSIS — I10 Essential (primary) hypertension: Secondary | ICD-10-CM | POA: Diagnosis not present

## 2016-04-26 DIAGNOSIS — E119 Type 2 diabetes mellitus without complications: Secondary | ICD-10-CM | POA: Diagnosis not present

## 2016-04-29 ENCOUNTER — Other Ambulatory Visit: Payer: Self-pay | Admitting: Internal Medicine

## 2016-04-29 DIAGNOSIS — Z1231 Encounter for screening mammogram for malignant neoplasm of breast: Secondary | ICD-10-CM

## 2016-04-29 DIAGNOSIS — N183 Chronic kidney disease, stage 3 (moderate): Secondary | ICD-10-CM | POA: Diagnosis not present

## 2016-04-29 DIAGNOSIS — I714 Abdominal aortic aneurysm, without rupture: Secondary | ICD-10-CM | POA: Diagnosis not present

## 2016-04-29 DIAGNOSIS — I10 Essential (primary) hypertension: Secondary | ICD-10-CM | POA: Diagnosis not present

## 2016-04-29 DIAGNOSIS — E1059 Type 1 diabetes mellitus with other circulatory complications: Secondary | ICD-10-CM | POA: Diagnosis not present

## 2016-04-29 DIAGNOSIS — Z0000 Encounter for general adult medical examination without abnormal findings: Secondary | ICD-10-CM | POA: Diagnosis not present

## 2016-04-29 DIAGNOSIS — I480 Paroxysmal atrial fibrillation: Secondary | ICD-10-CM | POA: Diagnosis not present

## 2016-04-29 DIAGNOSIS — Z952 Presence of prosthetic heart valve: Secondary | ICD-10-CM | POA: Diagnosis not present

## 2016-04-29 DIAGNOSIS — R808 Other proteinuria: Secondary | ICD-10-CM | POA: Diagnosis not present

## 2016-04-29 DIAGNOSIS — I6529 Occlusion and stenosis of unspecified carotid artery: Secondary | ICD-10-CM | POA: Diagnosis not present

## 2016-04-29 DIAGNOSIS — E782 Mixed hyperlipidemia: Secondary | ICD-10-CM | POA: Diagnosis not present

## 2016-05-10 ENCOUNTER — Ambulatory Visit (HOSPITAL_COMMUNITY): Payer: Medicare Other

## 2016-05-18 ENCOUNTER — Other Ambulatory Visit (HOSPITAL_COMMUNITY): Payer: Self-pay | Admitting: *Deleted

## 2016-05-19 ENCOUNTER — Ambulatory Visit (HOSPITAL_COMMUNITY)
Admission: RE | Admit: 2016-05-19 | Discharge: 2016-05-19 | Disposition: A | Discharge status: Home / Self Care | Payer: Medicare HMO | Source: Ambulatory Visit | Attending: Internal Medicine | Admitting: Internal Medicine

## 2016-05-19 DIAGNOSIS — M810 Age-related osteoporosis without current pathological fracture: Secondary | ICD-10-CM | POA: Diagnosis not present

## 2016-05-19 MED ORDER — ZOLEDRONIC ACID 5 MG/100ML IV SOLN
INTRAVENOUS | Status: AC
  Administered 2016-05-19: 5 mg
  Filled 2016-05-19: qty 100

## 2016-05-19 MED ORDER — ZOLEDRONIC ACID 5 MG/100ML IV SOLN: 5.0000 mg | Freq: Once | INTRAVENOUS | Status: DC

## 2016-05-20 ENCOUNTER — Encounter (HOSPITAL_COMMUNITY): Payer: Medicare Other

## 2016-05-20 ENCOUNTER — Ambulatory Visit (HOSPITAL_COMMUNITY)
Admission: RE | Admit: 2016-05-20 | Discharge: 2016-05-20 | Disposition: A | Discharge status: Home / Self Care | Payer: Medicare HMO | Source: Ambulatory Visit | Attending: Cardiology | Admitting: Cardiology

## 2016-05-20 DIAGNOSIS — I729 Aneurysm of unspecified site: Secondary | ICD-10-CM

## 2016-05-20 DIAGNOSIS — I714 Abdominal aortic aneurysm, without rupture, unspecified: Secondary | ICD-10-CM

## 2016-05-20 DIAGNOSIS — I771 Stricture of artery: Secondary | ICD-10-CM | POA: Insufficient documentation

## 2016-05-20 DIAGNOSIS — I6523 Occlusion and stenosis of bilateral carotid arteries: Secondary | ICD-10-CM | POA: Insufficient documentation

## 2016-05-24 ENCOUNTER — Telehealth: Payer: Self-pay | Admitting: Cardiology

## 2016-05-24 NOTE — Telephone Encounter (Signed)
New Message  Pt states she received a call from the office. Please call back to discuss if needed.

## 2016-05-24 NOTE — Telephone Encounter (Signed)
Pt aware of her result 

## 2016-05-26 NOTE — Progress Notes (Signed)
HPI The patient presents for followup after aortic valve replacement. Since I last saw her she has done well.  She walks her dog daily.  The patient denies any new symptoms such as chest discomfort, neck or arm discomfort. There has been no new shortness of breath, PND or orthopnea. There have been no reported palpitations, presyncope or syncope.     Allergies  Allergen Reactions  . Codeine   . Penicillins   . Sulfonamide Derivatives     Current Outpatient Prescriptions  Medication Sig Dispense Refill  . ALPRAZolam (XANAX) 0.25 MG tablet Take 0.25 mg by mouth at bedtime as needed.      Marland Kitchen aspirin 81 MG tablet Take 81 mg by mouth daily.      . Cholecalciferol (VITAMIN D-3) 1000 UNITS CAPS Take 1,000 Units by mouth daily.    Marland Kitchen lisinopril-hydrochlorothiazide (PRINZIDE,ZESTORETIC) 20-25 MG per tablet Take 1 tablet by mouth daily. 30 tablet 11  . metFORMIN (GLUCOPHAGE) 500 MG tablet 2 tabs in the am and 2 tabs in the pm, total of 4 tablets daily.    . metFORMIN (GLUCOPHAGE-XR) 500 MG 24 hr tablet Take 500 mg by mouth 2 (two) times daily.    . Multiple Vitamin (MULTIVITAMIN) capsule Take 1 capsule by mouth daily.      . pravastatin (PRAVACHOL) 40 MG tablet Take 40 mg by mouth daily.       No current facility-administered medications for this visit.     Past Medical History:  Diagnosis Date  . ABDOMINAL AORTIC ANEURYSM   . CAROTID STENOSIS   . FIBRILLATION, ATRIAL   . HYPERCHOLESTEROLEMIA  IIA   . HYPERTENSION, MILD   . MURMUR   . PERIPHERAL VASCULAR DISEASE   . STENOSIS, MITRAL AND AORTIC VALVES     Past Surgical History:  Procedure Laterality Date  . AORTIC VALVE REPLACEMENT    . TOTAL ABDOMINAL HYSTERECTOMY      ROS:    As stated in the HPI and negative for all other systems.  PHYSICAL EXAM BP (!) 176/65 (BP Location: Left Arm)   Pulse 62   Ht 5\' 5"  (1.651 m)   Wt 118 lb (53.5 kg)   BMI 19.64 kg/m  GENERAL:  Well appearing NECK:  No jugular venous distention,  waveform within normal limits, carotid upstroke brisk and symmetric, bilateral bruits, no thyromegaly LYMPHATICS:  No cervical, inguinal adenopathy LUNGS:  Clear to auscultation bilaterally BACK:  No CVA tenderness CHEST: Well healed sternotomy scar. HEART:  PMI not displaced or sustained,S1 and S2 within normal limits, no S3, no S4, no clicks, no rubs, apical systolic murmur radiating out the aortic outflow tract.   ABD:  Flat, positive bowel sounds normal in frequency in pitch, no bruits, no rebound, no guarding, no midline pulsatile mass, no hepatomegaly, no splenomegaly EXT:  2 plus pulses throughout except for diminished left radial, no edema, no cyanosis no clubbing SKIN:  No rashes no nodules   EKG:  Sinus rhythm, rate 62, axis within normal limits, intervals within normal limits, nonspecific anterolateral T-wave flattening.  No change from previous. 05/27/2016   ASSESSMENT AND PLAN  AORTIC VALVE REPLACEMENT, HX OF - There was moderate stenosis in Dec of last year.  I will repeat this next year before I see her.  She has no symptoms.    ABDOMINAL AORTIC ANEURYSM - This was 3 x 3 and will be follow up next year.    DYSLIPIDEMIA -  This is followed by Tivis Ringer,  MD.     HYPERTENSION, MILD -  She needs a nurse visit to check that her BP cuff at home works and she is going to then keep a diary.  AVVA,RAVISANKAR R, MD just increased her ACE inhibitor.   TOBACCO USE - She says that she has completely quit smoking.     CAROTID STENOSIS - This was mild earlier this month and it will be follow up as needed.

## 2016-05-27 ENCOUNTER — Encounter: Payer: Self-pay | Admitting: Cardiology

## 2016-05-27 ENCOUNTER — Ambulatory Visit
Admission: RE | Admit: 2016-05-27 | Discharge: 2016-05-27 | Disposition: A | Discharge status: Home / Self Care | Payer: Medicare HMO | Source: Ambulatory Visit | Attending: Internal Medicine | Admitting: Internal Medicine

## 2016-05-27 ENCOUNTER — Ambulatory Visit: Payer: Medicare Other

## 2016-05-27 ENCOUNTER — Ambulatory Visit (INDEPENDENT_AMBULATORY_CARE_PROVIDER_SITE_OTHER): Payer: Medicare HMO | Admitting: Cardiology

## 2016-05-27 VITALS — BP 176/65 | HR 62 | Ht 65.0 in | Wt 118.0 lb

## 2016-05-27 DIAGNOSIS — I714 Abdominal aortic aneurysm, without rupture, unspecified: Secondary | ICD-10-CM

## 2016-05-27 DIAGNOSIS — Z1231 Encounter for screening mammogram for malignant neoplasm of breast: Secondary | ICD-10-CM

## 2016-05-27 DIAGNOSIS — Z952 Presence of prosthetic heart valve: Secondary | ICD-10-CM | POA: Diagnosis not present

## 2016-05-27 NOTE — Patient Instructions (Addendum)
Medication Instructions:  Continue current medications  Labwork: None Ordered  Testing/Procedures: Your physician has requested that you have an echocardiogram in 1 Year. Echocardiography is a painless test that uses sound waves to create images of your heart. It provides your doctor with information about the size and shape of your heart and how well your heart's chambers and valves are working. This procedure takes approximately one hour. There are no restrictions for this procedure.  Your physician has requested that you have an abdominal aorta duplex in 1 Year. During this test, an ultrasound is used to evaluate the aorta. Allow 30 minutes for this exam. Do not eat after midnight the day before and avoid carbonated beverages   Follow-Up: Your physician recommends that you schedule a follow-up appointment in: 2 Weeks Nurse Visit for Blood Pressure Check.  Your physician wants you to follow-up in: 1 Year after Doppler. You will receive a reminder letter in the mail two months in advance. If you don't receive a letter, please call our office to schedule the follow-up appointment.   Any Other Special Instructions Will Be Listed Below (If Applicable).   If you need a refill on your cardiac medications before your next appointment, please call your pharmacy.

## 2016-05-28 ENCOUNTER — Encounter: Payer: Self-pay | Admitting: Cardiology

## 2016-06-10 ENCOUNTER — Ambulatory Visit: Payer: Medicare HMO

## 2016-06-10 NOTE — Progress Notes (Deleted)
Patient ID: VEGAS COFFIN                 DOB: Jun 16, 1945                      MRN: [id number]     HPI: Sandra Sheppard is a 70 y.o. female patient of Dr. Percival Spanish with PMH below who presents today for hypertension evaluation. She was recently seen by Dr. Percival Spanish and asked to come back for verification of home cuff. BP was elevated to 176/65 in office that day.   Today she presents   Cardiac Hx: HTN, Afib, carotid stenosis, AAA, PVD, aortic valve replacement  Current HTN meds:  Lisinopril/HCTZ 20/25mg  daily  Previously tried:   BP goal: <130/80  Family History:   Social History:   Diet:   Exercise:   Home BP readings:   Wt Readings from Last 3 Encounters:  05/27/16 118 lb (53.5 kg)  05/19/16 118 lb (53.5 kg)  05/28/15 118 lb 6.4 oz (53.7 kg)   BP Readings from Last 3 Encounters:  05/27/16 (!) 176/65  05/19/16 (!) 175/59  05/28/15 (!) 146/64   Pulse Readings from Last 3 Encounters:  05/27/16 62  05/19/16 (!) 57  05/28/15 65    Renal function: CrCl cannot be calculated (Patient's most recent lab result is older than the maximum 21 days allowed.).  Past Medical History:  Diagnosis Date  . ABDOMINAL AORTIC ANEURYSM   . CAROTID STENOSIS   . FIBRILLATION, ATRIAL   . HYPERCHOLESTEROLEMIA  IIA   . HYPERTENSION, MILD   . MURMUR   . PERIPHERAL VASCULAR DISEASE   . STENOSIS, MITRAL AND AORTIC VALVES     Current Outpatient Prescriptions on File Prior to Visit  Medication Sig Dispense Refill  . ALPRAZolam (XANAX) 0.25 MG tablet Take 0.25 mg by mouth at bedtime as needed.      Marland Kitchen aspirin 81 MG tablet Take 81 mg by mouth daily.      . Cholecalciferol (VITAMIN D-3) 1000 UNITS CAPS Take 1,000 Units by mouth daily.    Marland Kitchen lisinopril-hydrochlorothiazide (PRINZIDE,ZESTORETIC) 20-25 MG per tablet Take 1 tablet by mouth daily. 30 tablet 11  . metFORMIN (GLUCOPHAGE) 500 MG tablet 2 tabs in the am and 2 tabs in the pm, total of 4 tablets daily.    . metFORMIN (GLUCOPHAGE-XR)  500 MG 24 hr tablet Take 500 mg by mouth 2 (two) times daily.    . Multiple Vitamin (MULTIVITAMIN) capsule Take 1 capsule by mouth daily.      . pravastatin (PRAVACHOL) 40 MG tablet Take 40 mg by mouth daily.       No current facility-administered medications on file prior to visit.     Allergies  Allergen Reactions  . Codeine   . Penicillins   . Sulfonamide Derivatives     There were no vitals taken for this visit.   Assessment/Plan: Hypertension:    Thank you, Lelan Pons. Patterson Hammersmith, [facility]  06/10/2016 7:35 AM

## 2016-06-27 DIAGNOSIS — Z681 Body mass index (BMI) 19 or less, adult: Secondary | ICD-10-CM | POA: Diagnosis not present

## 2016-06-27 DIAGNOSIS — R05 Cough: Secondary | ICD-10-CM | POA: Diagnosis not present

## 2016-06-27 DIAGNOSIS — J029 Acute pharyngitis, unspecified: Secondary | ICD-10-CM | POA: Diagnosis not present

## 2016-06-27 DIAGNOSIS — J189 Pneumonia, unspecified organism: Secondary | ICD-10-CM | POA: Diagnosis not present

## 2016-07-04 ENCOUNTER — Encounter (HOSPITAL_COMMUNITY): Payer: Self-pay

## 2016-07-04 ENCOUNTER — Emergency Department (HOSPITAL_COMMUNITY): Payer: Medicare HMO

## 2016-07-04 ENCOUNTER — Observation Stay (HOSPITAL_COMMUNITY)
Admission: EM | Admit: 2016-07-04 | Discharge: 2016-07-05 | Disposition: A | Discharge status: Home / Self Care | Payer: Medicare HMO | Attending: Internal Medicine | Admitting: Internal Medicine

## 2016-07-04 DIAGNOSIS — I714 Abdominal aortic aneurysm, without rupture: Secondary | ICD-10-CM | POA: Diagnosis not present

## 2016-07-04 DIAGNOSIS — E1151 Type 2 diabetes mellitus with diabetic peripheral angiopathy without gangrene: Secondary | ICD-10-CM | POA: Diagnosis not present

## 2016-07-04 DIAGNOSIS — Z8371 Family history of colonic polyps: Secondary | ICD-10-CM | POA: Insufficient documentation

## 2016-07-04 DIAGNOSIS — Z953 Presence of xenogenic heart valve: Secondary | ICD-10-CM | POA: Diagnosis not present

## 2016-07-04 DIAGNOSIS — Z7984 Long term (current) use of oral hypoglycemic drugs: Secondary | ICD-10-CM | POA: Diagnosis not present

## 2016-07-04 DIAGNOSIS — K529 Noninfective gastroenteritis and colitis, unspecified: Principal | ICD-10-CM | POA: Insufficient documentation

## 2016-07-04 DIAGNOSIS — I129 Hypertensive chronic kidney disease with stage 1 through stage 4 chronic kidney disease, or unspecified chronic kidney disease: Secondary | ICD-10-CM | POA: Diagnosis not present

## 2016-07-04 DIAGNOSIS — J449 Chronic obstructive pulmonary disease, unspecified: Secondary | ICD-10-CM | POA: Diagnosis not present

## 2016-07-04 DIAGNOSIS — Z952 Presence of prosthetic heart valve: Secondary | ICD-10-CM | POA: Diagnosis not present

## 2016-07-04 DIAGNOSIS — R112 Nausea with vomiting, unspecified: Secondary | ICD-10-CM

## 2016-07-04 DIAGNOSIS — I480 Paroxysmal atrial fibrillation: Secondary | ICD-10-CM | POA: Diagnosis not present

## 2016-07-04 DIAGNOSIS — Z7982 Long term (current) use of aspirin: Secondary | ICD-10-CM | POA: Insufficient documentation

## 2016-07-04 DIAGNOSIS — N183 Chronic kidney disease, stage 3 unspecified: Secondary | ICD-10-CM

## 2016-07-04 DIAGNOSIS — E1122 Type 2 diabetes mellitus with diabetic chronic kidney disease: Secondary | ICD-10-CM | POA: Insufficient documentation

## 2016-07-04 DIAGNOSIS — I700 Atherosclerosis of aorta: Secondary | ICD-10-CM | POA: Diagnosis not present

## 2016-07-04 DIAGNOSIS — F17210 Nicotine dependence, cigarettes, uncomplicated: Secondary | ICD-10-CM | POA: Diagnosis not present

## 2016-07-04 DIAGNOSIS — I739 Peripheral vascular disease, unspecified: Secondary | ICD-10-CM | POA: Diagnosis present

## 2016-07-04 DIAGNOSIS — I6529 Occlusion and stenosis of unspecified carotid artery: Secondary | ICD-10-CM | POA: Insufficient documentation

## 2016-07-04 DIAGNOSIS — I10 Essential (primary) hypertension: Secondary | ICD-10-CM | POA: Diagnosis not present

## 2016-07-04 DIAGNOSIS — I4891 Unspecified atrial fibrillation: Secondary | ICD-10-CM | POA: Diagnosis present

## 2016-07-04 DIAGNOSIS — I050 Rheumatic mitral stenosis: Secondary | ICD-10-CM | POA: Insufficient documentation

## 2016-07-04 DIAGNOSIS — Z79899 Other long term (current) drug therapy: Secondary | ICD-10-CM | POA: Insufficient documentation

## 2016-07-04 DIAGNOSIS — Z8249 Family history of ischemic heart disease and other diseases of the circulatory system: Secondary | ICD-10-CM | POA: Diagnosis not present

## 2016-07-04 DIAGNOSIS — Z885 Allergy status to narcotic agent status: Secondary | ICD-10-CM | POA: Diagnosis not present

## 2016-07-04 DIAGNOSIS — Z882 Allergy status to sulfonamides status: Secondary | ICD-10-CM | POA: Insufficient documentation

## 2016-07-04 DIAGNOSIS — E7800 Pure hypercholesterolemia, unspecified: Secondary | ICD-10-CM | POA: Insufficient documentation

## 2016-07-04 DIAGNOSIS — I350 Nonrheumatic aortic (valve) stenosis: Secondary | ICD-10-CM | POA: Insufficient documentation

## 2016-07-04 DIAGNOSIS — Z880 Allergy status to penicillin: Secondary | ICD-10-CM | POA: Insufficient documentation

## 2016-07-04 DIAGNOSIS — R1110 Vomiting, unspecified: Secondary | ICD-10-CM | POA: Diagnosis present

## 2016-07-04 DIAGNOSIS — I080 Rheumatic disorders of both mitral and aortic valves: Secondary | ICD-10-CM | POA: Diagnosis present

## 2016-07-04 DIAGNOSIS — R05 Cough: Secondary | ICD-10-CM | POA: Diagnosis not present

## 2016-07-04 LAB — URINALYSIS, ROUTINE W REFLEX MICROSCOPIC
Bilirubin Urine: NEGATIVE
Glucose, UA: NEGATIVE mg/dL
Hgb urine dipstick: NEGATIVE
Ketones, ur: NEGATIVE mg/dL
Leukocytes, UA: NEGATIVE
Nitrite: NEGATIVE
Protein, ur: NEGATIVE mg/dL
Specific Gravity, Urine: 1.008 (ref 1.005–1.030)
pH: 5 (ref 5.0–8.0)

## 2016-07-04 LAB — CBC WITH DIFFERENTIAL/PLATELET
Basophils Absolute: 0.1 10*3/uL (ref 0.0–0.1)
Basophils Relative: 1 %
Eosinophils Absolute: 0.1 10*3/uL (ref 0.0–0.7)
Eosinophils Relative: 1 %
HCT: 38.1 % (ref 36.0–46.0)
Hemoglobin: 12.8 g/dL (ref 12.0–15.0)
Lymphocytes Relative: 15 %
Lymphs Abs: 1.7 10*3/uL (ref 0.7–4.0)
MCH: 29 pg (ref 26.0–34.0)
MCHC: 33.6 g/dL (ref 30.0–36.0)
MCV: 86.2 fL (ref 78.0–100.0)
Monocytes Absolute: 0.7 10*3/uL (ref 0.1–1.0)
Monocytes Relative: 6 %
Neutro Abs: 8.4 10*3/uL — ABNORMAL HIGH (ref 1.7–7.7)
Neutrophils Relative %: 77 %
Platelets: 235 10*3/uL (ref 150–400)
RBC: 4.42 MIL/uL (ref 3.87–5.11)
RDW: 14.4 % (ref 11.5–15.5)
WBC: 11 10*3/uL — ABNORMAL HIGH (ref 4.0–10.5)

## 2016-07-04 LAB — LIPASE, BLOOD: Lipase: 16 U/L (ref 11–51)

## 2016-07-04 LAB — COMPREHENSIVE METABOLIC PANEL
ALT: 9 U/L — ABNORMAL LOW (ref 14–54)
AST: 18 U/L (ref 15–41)
Albumin: 3.7 g/dL (ref 3.5–5.0)
Alkaline Phosphatase: 76 U/L (ref 38–126)
Anion gap: 12 (ref 5–15)
BUN: 16 mg/dL (ref 6–20)
CO2: 22 mmol/L (ref 22–32)
Calcium: 9.5 mg/dL (ref 8.9–10.3)
Chloride: 104 mmol/L (ref 101–111)
Creatinine, Ser: 1.64 mg/dL — ABNORMAL HIGH (ref 0.44–1.00)
GFR calc Af Amer: 35 mL/min — ABNORMAL LOW (ref >60)
GFR calc non Af Amer: 30 mL/min — ABNORMAL LOW (ref >60)
Glucose, Bld: 115 mg/dL — ABNORMAL HIGH (ref 65–99)
Potassium: 4.6 mmol/L (ref 3.5–5.1)
Sodium: 138 mmol/L (ref 135–145)
Total Bilirubin: 0.4 mg/dL (ref 0.3–1.2)
Total Protein: 7 g/dL (ref 6.5–8.1)

## 2016-07-04 LAB — I-STAT CG4 LACTIC ACID, ED
Lactic Acid, Venous: 2.41 mmol/L (ref 0.5–1.9)
Lactic Acid, Venous: 1.71 mmol/L (ref 0.5–1.9)

## 2016-07-04 LAB — INFLUENZA PANEL BY PCR (TYPE A & B)
Influenza A By PCR: NEGATIVE
Influenza B By PCR: NEGATIVE

## 2016-07-04 IMAGING — DX DG CHEST 2V
2 series · 2 of 2 positions shown · non-contrast
Comparison: A [DATE]

CLINICAL DATA: Pneumonia, nausea, vomiting, cough, congestion,
history hypertension, atrial fibrillation, former smoker

EXAM:
CHEST  2 VIEW

[chest pa]
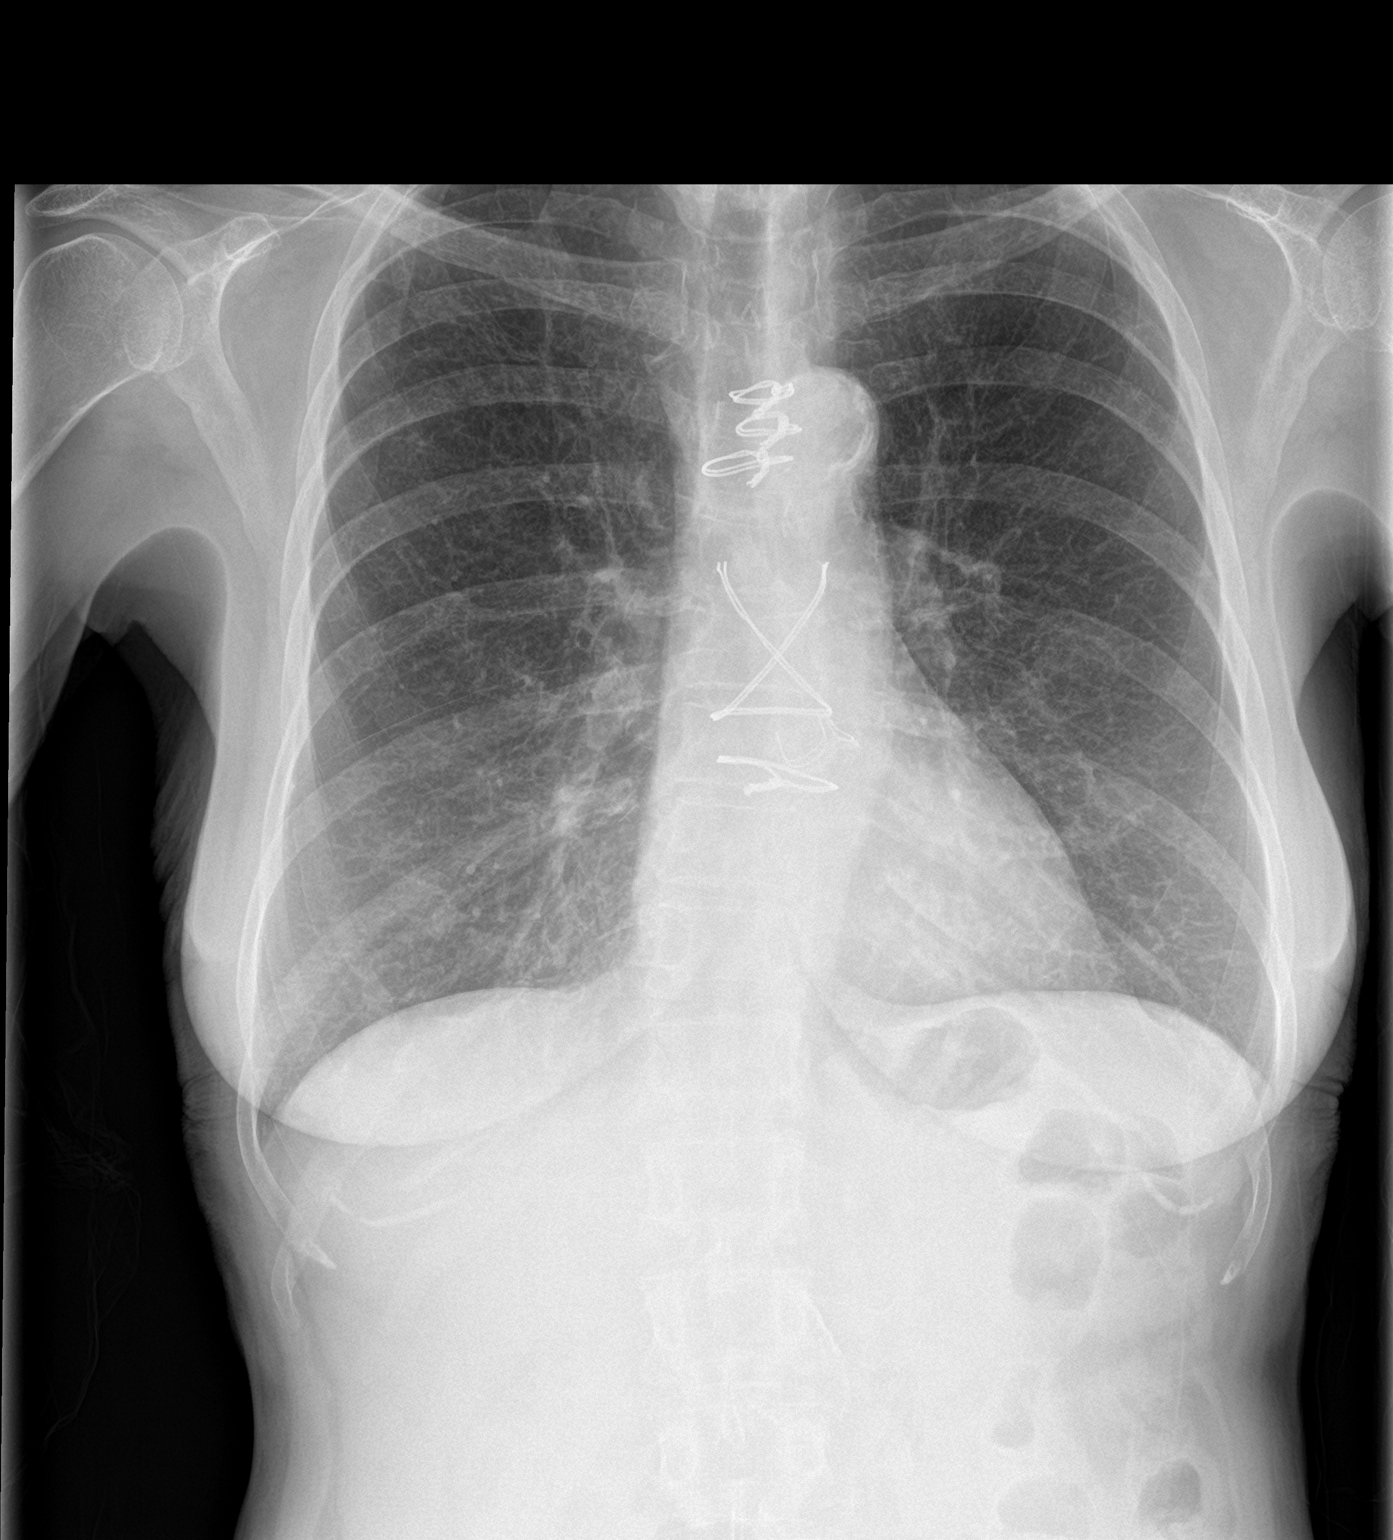

[chest lat]
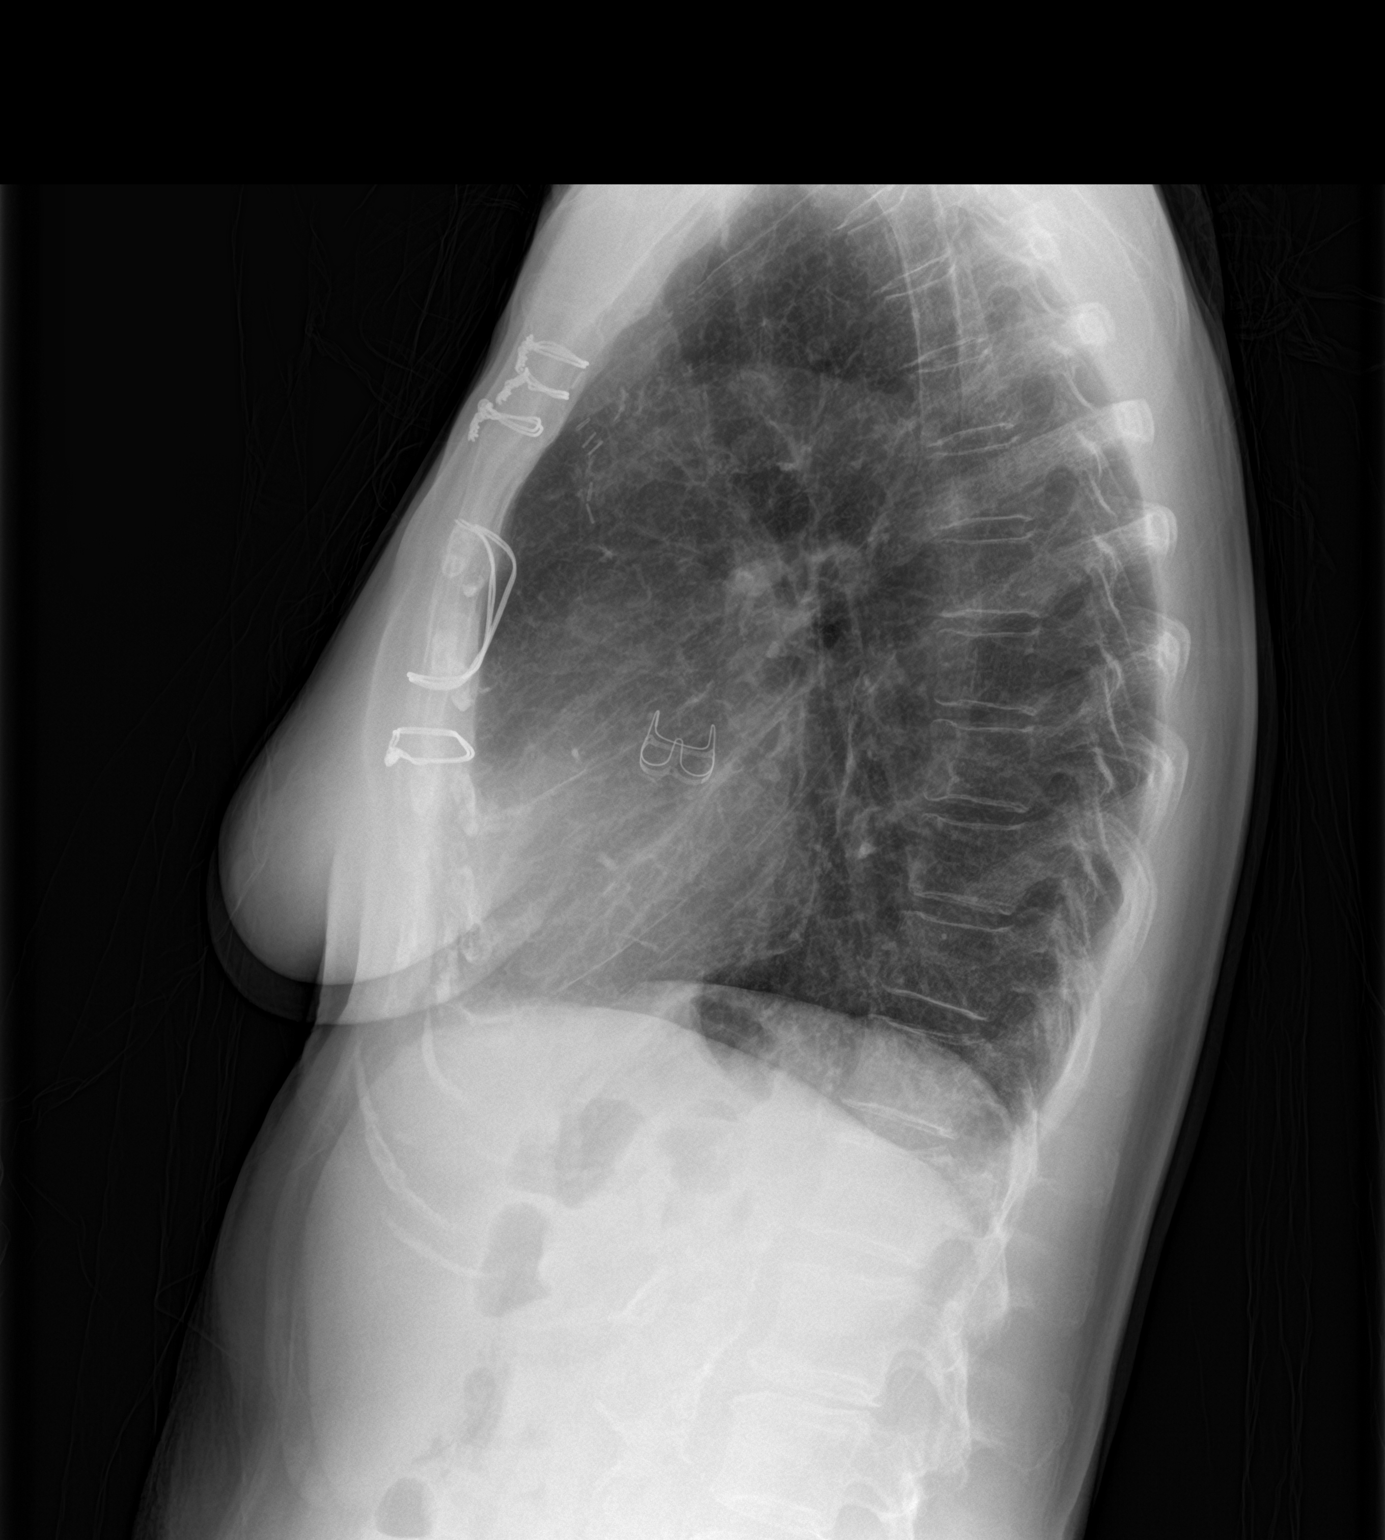

[2 of 2 positions shown; findings below may reference images not displayed]

FINDINGS: Normal heart size post AVR.

Mediastinal contours and pulmonary vascularity normal.

Atherosclerotic calcification aorta.

Lungs emphysematous with minimal central peribronchial thickening.

No acute infiltrate, pleural effusion, or pneumothorax.

Bones diffusely demineralized.
IMPRESSION: Post AVR.

Aortic atherosclerosis.

COPD changes without acute infiltrate.

## 2016-07-04 MED ORDER — ACETAMINOPHEN 325 MG PO TABS: 650.0000 mg | ORAL_TABLET | Freq: Four times a day (QID) | ORAL | Status: DC | PRN

## 2016-07-04 MED ORDER — ACETAMINOPHEN 650 MG RE SUPP: 650.0000 mg | Freq: Four times a day (QID) | RECTAL | Status: DC | PRN

## 2016-07-04 MED ORDER — PRAVASTATIN SODIUM 40 MG PO TABS
40.0000 mg | ORAL_TABLET | Freq: Every day | ORAL | Status: DC
  Administered 2016-07-05: 40 mg via ORAL
  Filled 2016-07-04: qty 1

## 2016-07-04 MED ORDER — ONDANSETRON HCL 4 MG/2ML IJ SOLN: 4.0000 mg | Freq: Four times a day (QID) | INTRAMUSCULAR | Status: DC | PRN

## 2016-07-04 MED ORDER — HYDRALAZINE HCL 20 MG/ML IJ SOLN: 10.0000 mg | Freq: Four times a day (QID) | INTRAMUSCULAR | Status: DC | PRN

## 2016-07-04 MED ORDER — ASPIRIN EC 81 MG PO TBEC
81.0000 mg | DELAYED_RELEASE_TABLET | Freq: Every day | ORAL | Status: DC
  Administered 2016-07-05: 81 mg via ORAL
  Filled 2016-07-04: qty 1

## 2016-07-04 MED ORDER — INSULIN ASPART 100 UNIT/ML ~~LOC~~ SOLN
0.0000 [IU] | Freq: Three times a day (TID) | SUBCUTANEOUS | Status: DC
  Administered 2016-07-05: 2 [IU] via SUBCUTANEOUS
  Filled 2016-07-04: qty 1

## 2016-07-04 MED ORDER — ONDANSETRON HCL 4 MG PO TABS: 4.0000 mg | ORAL_TABLET | Freq: Four times a day (QID) | ORAL | Status: DC | PRN

## 2016-07-04 MED ORDER — LISINOPRIL 20 MG PO TABS
20.0000 mg | ORAL_TABLET | Freq: Every day | ORAL | Status: DC
  Administered 2016-07-05: 20 mg via ORAL
  Filled 2016-07-04: qty 1

## 2016-07-04 MED ORDER — SODIUM CHLORIDE 0.9 % IV SOLN
Freq: Once | INTRAVENOUS | Status: AC
  Administered 2016-07-04: 20:00:00 via INTRAVENOUS

## 2016-07-04 MED ORDER — SODIUM CHLORIDE 0.9 % IV BOLUS (SEPSIS)
1000.0000 mL | Freq: Once | INTRAVENOUS | Status: AC
  Administered 2016-07-04: 1000 mL via INTRAVENOUS

## 2016-07-04 NOTE — ED Triage Notes (Signed)
Pt reports she was dx with pneumonia last week. She has been using abx at home. She reports she now has vomiting and is unable to tolerate anything PO.

## 2016-07-04 NOTE — ED Provider Notes (Signed)
Emergency Department Provider Note   I have reviewed the triage vital signs and the nursing notes.   HISTORY  Chief Complaint Pneumonia   HPI Sandra Sheppard is a 71 y.o. female with PMH of AAA, a-fib, HTN, HLD, and s/p AVR and stated the emergency department for evaluation of continued SOB, cough, back pain, HA after recent dx of PNA 1 week prior and new onset vomiting for the past 4 days. Patient states she saw her primary care physician one week ago and was diagnosed with pneumonia after chest x-ray. She was started on antibiotics has been taking them as prescribed. She never felt much better and 4 days ago began having non-bloody vomiting. She describes multiple episodes of emesis per day. She states she is not sleep much last night because of being of vomiting. She lives alone with no known sick contacts. No associated diarrhea. She is not recorded any fevers. Denies shaking chills. No exacerbating or alleviating factors.   Past Medical History:  Diagnosis Date  . ABDOMINAL AORTIC ANEURYSM   . CAROTID STENOSIS   . FIBRILLATION, ATRIAL   . HYPERCHOLESTEROLEMIA  IIA   . HYPERTENSION, MILD   . MURMUR   . PERIPHERAL VASCULAR DISEASE   . STENOSIS, MITRAL AND AORTIC VALVES     Patient Active Problem List   Diagnosis Date Noted  . Vomiting 07/04/2016  . Gastroenteritis 07/04/2016  . CKD (chronic kidney disease) stage 3, GFR 30-59 ml/min 07/04/2016  . Occlusion and stenosis of carotid artery 04/28/2010  . ABDOMINAL AORTIC ANEURYSM 04/28/2010  . Peripheral vascular disease ([place]) 12/08/2008  . STENOSIS, MITRAL AND AORTIC VALVES 10/07/2008  . FIBRILLATION, ATRIAL 10/07/2008  . H/O aortic valve replacement 08/14/2008  . HYPERCHOLESTEROLEMIA  IIA 06/23/2008  . HYPERTENSION, MILD 06/23/2008  . MURMUR 06/23/2008    Past Surgical History:  Procedure Laterality Date  . AORTIC VALVE REPLACEMENT    . TOTAL ABDOMINAL HYSTERECTOMY      Current Outpatient Rx  . Order #:  [id number] Class: Historical Med  . Order #: [id number] Class: Historical Med  . Order #: [id number] Class: Normal  . Order #: [id number] Class: Historical Med  . Order #: [id number] Class: Historical Med  . Order #: [id number] Class: Historical Med    Allergies Codeine; Sulfonamide derivatives; and Penicillins  Family History  Problem Relation Age of Onset  . Colon polyps      Social History Social History  Substance Use Topics  . Smoking status: Former Research scientist (life sciences)  . Smokeless tobacco: Never Used  . Alcohol use No    Review of Systems  Constitutional: No fever/chills Eyes: No visual changes. ENT: No sore throat. Cardiovascular: Denies chest pain. Respiratory: Denies shortness of breath. Positive cough.  Gastrointestinal: No abdominal pain. Positive nausea and vomiting.  No diarrhea.  No constipation. Genitourinary: Negative for dysuria. Musculoskeletal: Positive for back pain. Skin: Negative for rash. Neurological: Negative for headaches, focal weakness or numbness.  10-point ROS otherwise negative.  ____________________________________________   PHYSICAL EXAM:  VITAL SIGNS: ED Triage Vitals  Enc Vitals Group     BP 07/04/16 1201 127/61     Pulse Rate 07/04/16 1201 81     Resp 07/04/16 1201 18     Temp 07/04/16 1201 98.4 F (36.9 C)     Temp Source 07/04/16 1201 Oral     SpO2 07/04/16 1201 98 %     Weight 07/04/16 1201 112 lb (50.8 kg)     Height 07/04/16 1201 5\' 5"  (1.651 m)     Pain  Score 07/04/16 1209 0   Constitutional: Alert and oriented. Well appearing and in no acute distress. Eyes: Conjunctivae are normal.  Head: Atraumatic. Nose: No congestion/rhinnorhea. Mouth/Throat: Mucous membranes are very dry. Oropharynx non-erythematous. Neck: No stridor.  Cardiovascular: Normal rate, regular rhythm. Good peripheral circulation. Grossly normal heart sounds.   Respiratory: Normal respiratory effort.  No retractions. Lungs CTAB. Gastrointestinal: Soft and nontender. No  distention.  Musculoskeletal: No lower extremity tenderness nor edema. No gross deformities of extremities. Neurologic:  Normal speech and language. No gross focal neurologic deficits are appreciated.  Skin:  Skin is warm, dry and intact. No rash noted.  ____________________________________________   LABS (all labs ordered are listed, but only abnormal results are displayed)  Labs Reviewed  COMPREHENSIVE METABOLIC PANEL - Abnormal; Notable for the following:       Result Value   Glucose, Bld 115 (*)    Creatinine, Ser 1.64 (*)    ALT 9 (*)    GFR calc non Af Amer 30 (*)    GFR calc Af Amer 35 (*)    All other components within normal limits  CBC WITH DIFFERENTIAL/PLATELET - Abnormal; Notable for the following:    WBC 11.0 (*)    Neutro Abs 8.4 (*)    All other components within normal limits  CBC - Abnormal; Notable for the following:    RBC 3.81 (*)    Hemoglobin 10.8 (*)    HCT 32.7 (*)    All other components within normal limits  I-STAT CG4 LACTIC ACID, ED - Abnormal; Notable for the following:    Lactic Acid, Venous 2.41 (*)    All other components within normal limits  CBG MONITORING, ED - Abnormal; Notable for the following:    Glucose-Capillary 170 (*)    All other components within normal limits  C DIFFICILE QUICK SCREEN W PCR REFLEX  GASTROINTESTINAL PANEL BY PCR, STOOL (REPLACES STOOL CULTURE)  URINALYSIS, ROUTINE W REFLEX MICROSCOPIC  INFLUENZA PANEL BY PCR (TYPE A & B)  LIPASE, BLOOD  I-STAT CG4 LACTIC ACID, ED   ____________________________________________  EKG   EKG Interpretation  Date/Time:  Monday July 04 2016 17:32:07 EST Ventricular Rate:  82 PR Interval:    QRS Duration: 79 QT Interval:  363 QTC Calculation: 424 R Axis:   71 Text Interpretation:  Sinus rhythm Borderline repolarization abnormality No STEMI.  Confirmed by LONG MD, JOSHUA [id number]) on 07/04/2016 5:40:15 PM        ____________________________________________  RADIOLOGY  Dg Chest 2 View  Result Date: 07/04/2016 CLINICAL DATA:  Pneumonia, nausea, vomiting, cough, congestion, history hypertension, atrial fibrillation, former smoker EXAM: CHEST  2 VIEW COMPARISON:  A 09/2009 FINDINGS: Normal heart size post AVR. Mediastinal contours and pulmonary vascularity normal. Atherosclerotic calcification aorta. Lungs emphysematous with minimal central peribronchial thickening. No acute infiltrate, pleural effusion, or pneumothorax. Bones diffusely demineralized. IMPRESSION: Post AVR. Aortic atherosclerosis. COPD changes without acute infiltrate. Electronically Signed   By: Lavonia Dana M.D.   On: 07/04/2016 13:17    ____________________________________________   PROCEDURES  Procedure(s) performed:   Procedures  None ____________________________________________   INITIAL IMPRESSION / ASSESSMENT AND PLAN / ED COURSE  Pertinent labs & imaging results that were available during my care of the patient were reviewed by me and considered in my medical decision making (see chart for details).  Patient resents to the emergency department for evaluation of vomiting for the past 4 days in the setting of antibiotic treatment for recent pneumonia. Patient has very dry  mucous membranes. She is afebrile here with no tachycardia. No tachypnea. She does have a mild leukocytosis and elevated lactate which may be from dehydration. We'll also test for the flu. Plan for IV fluids and admission for continued hydration overnight. No obvious bacterial source for infection requiring antibiotics. I believe at this time her lactate is elevated from a viral infection or from associated dehydration. Patient is NOT a code sepsis at this time.   Discussed patient's case with hospitlaist, Dr. Eulas Post.  Recommend admission to obs, med-surg bed.  I will place holding orders per their request. Patient and family (if present) updated with  plan. Care transferred to hospitalist service.  I reviewed all nursing notes, vitals, pertinent old records, EKGs, labs, imaging (as available).  ____________________________________________  FINAL CLINICAL IMPRESSION(S) / ED DIAGNOSES  Final diagnoses:  Non-intractable vomiting with nausea, unspecified vomiting type     MEDICATIONS GIVEN DURING THIS VISIT:  Medications  aspirin EC tablet 81 mg (81 mg Oral Given 07/05/16 1028)  pravastatin (PRAVACHOL) tablet 40 mg (40 mg Oral Given 07/05/16 1028)  lisinopril (PRINIVIL,ZESTRIL) tablet 20 mg (20 mg Oral Given 07/05/16 1028)  acetaminophen (TYLENOL) tablet 650 mg (not administered)    Or  acetaminophen (TYLENOL) suppository 650 mg (not administered)  ondansetron (ZOFRAN) tablet 4 mg (not administered)    Or  ondansetron (ZOFRAN) injection 4 mg (not administered)  insulin aspart (novoLOG) injection 0-9 Units (2 Units Subcutaneous Given 07/05/16 0758)  hydrALAZINE (APRESOLINE) injection 10 mg (not administered)  sodium chloride 0.9 % bolus 1,000 mL (0 mLs Intravenous Stopped 07/04/16 1734)  0.9 %  sodium chloride infusion ( Intravenous New Bag/Given 07/04/16 1934)     NEW OUTPATIENT MEDICATIONS STARTED DURING THIS VISIT:  None   Note:  This document was prepared using Dragon voice recognition software and may include unintentional dictation errors.  Nanda Quinton, MD Emergency Medicine   Margette Fast, MD 07/05/16 1058

## 2016-07-04 NOTE — ED Notes (Signed)
Nikki RN notified of Lactic

## 2016-07-04 NOTE — ED Notes (Signed)
Hospital Bed ordered

## 2016-07-04 NOTE — H&P (Signed)
History and Physical    LUTHER NEWHOUSE ZOX:[id number] DOB: 16-Aug-1945 DOA: 07/04/2016  PCP: Tivis Ringer, MD   Patient coming from: HOME  Chief Complaint: Inability to tolerate PO for the past two days  HPI: Sandra Sheppard is a 71 y.o. woman with a history of valvular heart disease S/P bioprosthetic aortic valve replacement, paroxysmal atrial fibrillation (CHADS-Vasc score of 4, she is NOT anticoagulated), HTN, PVD, carotid disease, AAA, and CKD 3 who presents to the ED complaining of nausea, vomiting, diarrhea, and the inability to tolerate PO for the past two days.  Symptoms started on Saturday.  Of note, she completed a seven day course of antibiotics (name unknown) for a diagnosis of CAP on Sunday.  She denies fevers, chills, sweats, or abdominal pain.  When asked more specific questions about her symptoms, she admits that she has only had two episodes of nonbloody emesis and one episode of diarrhea (soft stool; it was not watery or bloody).  However, she has had decreased PO intake and no desire to eat until this evening, after receiving IV fluids in the ED.  ED Course: Initial lactic acid level was mildly elevated at 2.41; improved to 1.71 after one liter of NS.  Mild leukocytosis of 11.  BUN 16, creatinine 1.64 (which appears consistent with baseline).  Chest xray negative for acute process.  U/A normal.  Flu screen negative.  Patient referred for observation.  Lipase added to initial labs.  In the interim, she decided to try to eat.  At the time of my evaluation, patient has dinner tray at bedside, attempting to eat fish, broccoli, and baked potato.  Review of Systems: As per HPI otherwise 10 point review of systems negative.    Past Medical History:  Diagnosis Date  . ABDOMINAL AORTIC ANEURYSM   . CAROTID STENOSIS   . FIBRILLATION, ATRIAL   . HYPERCHOLESTEROLEMIA  IIA   . HYPERTENSION, MILD   . MURMUR   . PERIPHERAL VASCULAR DISEASE   . STENOSIS, MITRAL AND AORTIC VALVES      Past Surgical History:  Procedure Laterality Date  . AORTIC VALVE REPLACEMENT    . TOTAL ABDOMINAL HYSTERECTOMY      SOCIAL HISTORY: She tells me that she is smoking six cigarettes daily.  No EtOH or illicit drug use.  She is a widow.  She does not have adult children.  Her sister is her next of kin.  Allergies  Allergen Reactions  . Codeine     Rash, Nausea and Vomiting   . Sulfonamide Derivatives   . Penicillins Rash    Has patient had a PCN reaction causing immediate rash, facial/tongue/throat swelling, SOB or lightheadedness with hypotension: YES Has patient had a PCN reaction causing severe rash involving mucus membranes or skin necrosis:NO Has patient had a PCN reaction that required hospitalization NO Has patient had a PCN reaction occurring within the last 10 years: NO If all of the above answers are "NO", then may proceed with Cephalosporin use.    Family History  Problem Relation Age of Onset  . Colon polyps    Father died of complications related to CHF. One brother died of complications related to massive MI at age 18.   Prior to Admission medications   Medication Sig Start Date End Date Taking? Authorizing Provider  aspirin 81 MG tablet Take 81 mg by mouth daily.     Yes Historical Provider, MD  Cholecalciferol (VITAMIN D-3) 1000 UNITS CAPS Take 1,000 Units by mouth daily.  Yes Historical Provider, MD  lisinopril-hydrochlorothiazide (PRINZIDE,ZESTORETIC) 20-25 MG per tablet Take 1 tablet by mouth daily. 06/11/13  Yes Minus Breeding, MD  metFORMIN (GLUCOPHAGE-XR) 500 MG 24 hr tablet Take 500 mg by mouth 2 (two) times daily. 04/22/15  Yes Historical Provider, MD  Multiple Vitamin (MULTIVITAMIN) capsule Take 1 capsule by mouth daily.     Yes Historical Provider, MD  pravastatin (PRAVACHOL) 40 MG tablet Take 40 mg by mouth daily.     Yes Historical Provider, MD    Physical Exam: Vitals:   07/04/16 1730 07/04/16 1830 07/04/16 1900 07/04/16 1930  BP: 156/72  169/66 179/99 153/95  Pulse: 84 61 77 79  Resp:      Temp:      TempSrc:      SpO2: 99% 98% 94% 98%  Weight:      Height:          Constitutional: NAD, calm, comfortable, NONtoxic appearing Vitals:   07/04/16 1730 07/04/16 1830 07/04/16 1900 07/04/16 1930  BP: 156/72 169/66 179/99 153/95  Pulse: 84 61 77 79  Resp:      Temp:      TempSrc:      SpO2: 99% 98% 94% 98%  Weight:      Height:       Eyes: PERRL, lids and conjunctivae normal ENMT: Mucous membranes are moist. Posterior pharynx mildly erythematous.  Normal dentition.  Neck: normal appearance, supple Respiratory: clear to auscultation bilaterally, no wheezing, no crackles. Normal respiratory effort. No accessory muscle use.  Cardiovascular: Normal rate, regular rhythm, + murmur heard throughout precordium with radiation to carotids.  No extremity edema. 2+ pedal pulses. + carotid bruit. GI: abdomen is soft and compressible.  No distention.  No tenderness.  No guarding.  No masses palpated.  Bowel sounds are present. Musculoskeletal:  No joint deformity in upper and lower extremities. Good ROM, no contractures. Normal muscle tone.  Skin: no rashes, warm and dry Neurologic: No focal deficits.  Sensation intact, Strength symmetric bilaterally, 5/5. Psychiatric: Normal judgment and insight. Alert and oriented x 3. Normal mood.     Labs on Admission: I have personally reviewed following labs and imaging studies  CBC:  Recent Labs Lab 07/04/16 1211  WBC 11.0*  NEUTROABS 8.4*  HGB 12.8  HCT 38.1  MCV 86.2  PLT 644   Basic Metabolic Panel:  Recent Labs Lab 07/04/16 1211  NA 138  K 4.6  CL 104  CO2 22  GLUCOSE 115*  BUN 16  CREATININE 1.64*  CALCIUM 9.5   GFR: Estimated Creatinine Clearance: 25.2 mL/min (by C-G formula based on SCr of 1.64 mg/dL (H)). Liver Function Tests:  Recent Labs Lab 07/04/16 1211  AST 18  ALT 9*  ALKPHOS 76  BILITOT 0.4  PROT 7.0  ALBUMIN 3.7   LIPASE pending  Urine  analysis:    Component Value Date/Time   COLORURINE YELLOW 07/04/2016 1750   APPEARANCEUR CLEAR 07/04/2016 1750   LABSPEC 1.008 07/04/2016 1750   PHURINE 5.0 07/04/2016 1750   GLUCOSEU NEGATIVE 07/04/2016 1750   HGBUR NEGATIVE 07/04/2016 1750   BILIRUBINUR NEGATIVE 07/04/2016 1750   KETONESUR NEGATIVE 07/04/2016 1750   PROTEINUR NEGATIVE 07/04/2016 1750   UROBILINOGEN 0.2 01/14/2010 1308   NITRITE NEGATIVE 07/04/2016 1750   LEUKOCYTESUR NEGATIVE 07/04/2016 1750   Sepsis Labs:  Lactic acid level 2.41, repeat 1.71  Radiological Exams on Admission: Dg Chest 2 View  Result Date: 07/04/2016 CLINICAL DATA:  Pneumonia, nausea, vomiting, cough, congestion, history hypertension, atrial  fibrillation, former smoker EXAM: CHEST  2 VIEW COMPARISON:  A 09/2009 FINDINGS: Normal heart size post AVR. Mediastinal contours and pulmonary vascularity normal. Atherosclerotic calcification aorta. Lungs emphysematous with minimal central peribronchial thickening. No acute infiltrate, pleural effusion, or pneumothorax. Bones diffusely demineralized. IMPRESSION: Post AVR. Aortic atherosclerosis. COPD changes without acute infiltrate. Electronically Signed   By: Lavonia Dana M.D.   On: 07/04/2016 13:17    EKG: Independently reviewed. NSR.  No acute ST segment elevation.  Assessment/Plan Principal Problem:   Gastroenteritis Active Problems:   HYPERCHOLESTEROLEMIA  IIA   STENOSIS, MITRAL AND AORTIC VALVES   HYPERTENSION, MILD   FIBRILLATION, ATRIAL   Occlusion and stenosis of carotid artery   Peripheral vascular disease (HCC)   H/O aortic valve replacement   Vomiting   CKD (chronic kidney disease) stage 3, GFR 30-59 ml/min     Probable self-limited gastroenteritis, though patient just completed a course of antibiotics for CAP.  She does not have signs and symptoms to suggest bowel obstruction at this point.  She does not have persistent diarrhea worrisome for C diff at this point.  The mild elevation of  her lactic acid level and mild leukocytosis were concerning to the ED physician, but I do not feel strongly that additional imaging is warranted at this time.  Patient wants to proceed with a trial of PO intake. --Hold on additional IV fluids for now; aortic valve area per last echo (2016) less than 1cm2 --HOLD HCTZ for now --Monitor for recurrent GI symptoms; expect her to go home in the AM.  HTN --Continue lisinopril 20mg  daily  Atrial fibrillation --Baby aspirin  Dyslipidemia --Statin  She did not mention being a diabetic but metformin is on her home medication list. --HOLD for now. --Low dose SSI AC.  No HS correction for now.   DVT prophylaxis: Low risk, early ambulation, outpatient status Code Status: DNI Family Communication: Patient alone in the ED at time of admission. Disposition Plan: Expect she will go home in the AM. Consults called: NONE Admission status: Place in observation, med surg.   TIME SPENT: 50 minutes   Eber Jones MD [facility] Pager [phone]  If 7PM-7AM, please contact night-coverage [email or web address] Password [facility]  07/04/2016, 7:52 PM

## 2016-07-04 NOTE — ED Notes (Signed)
Pt reassured we have ordered inpt bed for comfort. Pt very uncomfortable on ED stretcher. VSS

## 2016-07-05 DIAGNOSIS — K529 Noninfective gastroenteritis and colitis, unspecified: Secondary | ICD-10-CM | POA: Diagnosis not present

## 2016-07-05 LAB — CBC
HCT: 32.7 % — ABNORMAL LOW (ref 36.0–46.0)
Hemoglobin: 10.8 g/dL — ABNORMAL LOW (ref 12.0–15.0)
MCH: 28.3 pg (ref 26.0–34.0)
MCHC: 33 g/dL (ref 30.0–36.0)
MCV: 85.8 fL (ref 78.0–100.0)
Platelets: 185 10*3/uL (ref 150–400)
RBC: 3.81 MIL/uL — ABNORMAL LOW (ref 3.87–5.11)
RDW: 14.7 % (ref 11.5–15.5)
WBC: 10 10*3/uL (ref 4.0–10.5)

## 2016-07-05 LAB — CBG MONITORING, ED
Glucose-Capillary: 170 mg/dL — ABNORMAL HIGH (ref 65–99)
Glucose-Capillary: 85 mg/dL (ref 65–99)

## 2016-07-05 MED ORDER — ONDANSETRON HCL 4 MG PO TABS: 4.0000 mg | ORAL_TABLET | Freq: Four times a day (QID) | ORAL | 0 refills | Status: DC | PRN

## 2016-07-05 NOTE — ED Notes (Signed)
Pt CBG was 85, notified Nicole(RN)

## 2016-07-05 NOTE — Progress Notes (Signed)
Work Note   To Whomever it may concern,  Patient was admitted to [facility] for medical treatment on 07/04/16 and discharged the following day. She would benefit from staying at home and getting rest until the end of the week to gain her full strength back. She can return to work on 07/11/16.   Gerlean Ren MD

## 2016-07-05 NOTE — Progress Notes (Signed)
PROGRESS NOTE    Sandra Sheppard  PZW:[id number] DOB: 09-26-1945 DOA: 07/04/2016 PCP: Tivis Ringer, MD   Assessment & Plan:   Principal Problem:   Gastroenteritis Active Problems:   HYPERCHOLESTEROLEMIA  IIA   STENOSIS, MITRAL AND AORTIC VALVES   HYPERTENSION, MILD   FIBRILLATION, ATRIAL   Occlusion and stenosis of carotid artery   Peripheral vascular disease (HCC)   H/O aortic valve replacement   Vomiting   CKD (chronic kidney disease) stage 3, GFR 30-59 ml/min  Probable self-limited gastroenteritis,  -No more symptoms since being admitted, she is tolerating oral diet at this time  -I have advised her to keep herself well hydrated and follow up with her PCP -antiemetics prn   HTN --Continue lisinopril 20mg  daily  Atrial fibrillation --Baby aspirin  Dyslipidemia --Statin  DM2 --HOLD for now. --Low dose SSI AC.  No HS correction for now.    DVT prophylaxis: ambulation  Code Status: DNI Family Communication:  None needed  Disposition Plan: Discharge home   Consultants:  None  Procedures:   None   Antimicrobials:   None    Subjective: No complaints this morning. States she doesn't have any more nausea or vomiting. No futher episodes of diarrhea and she desires to go home.   Objective: Vitals:   07/05/16 0547 07/05/16 0800 07/05/16 1000 07/05/16 1002  BP: 148/78 142/65 154/62 154/62  Pulse: 73 71 72 69  Resp: 17 20  14   Temp: 98.6 F (37 C)     TempSrc: Oral     SpO2: 94% 96% 94% 94%  Weight:      Height:        Intake/Output Summary (Last 24 hours) at 07/05/16 1315 Last data filed at 07/05/16 0548  Gross per 24 hour  Intake                0 ml  Output              550 ml  Net             -550 ml   Filed Weights   07/04/16 1201  Weight: 112 lb (50.8 kg)    Examination:  General exam: Appears calm and comfortable  Respiratory system: Clear to auscultation. Respiratory effort normal. Cardiovascular system: S1 & S2 heard, RRR.  No JVD, murmurs, rubs, gallops or clicks. No pedal edema. Gastrointestinal system: Abdomen is nondistended, soft and nontender. No organomegaly or masses felt. Normal bowel sounds heard. Central nervous system: Alert and oriented. No focal neurological deficits. Extremities: Symmetric 5 x 5 power. Skin: No rashes, lesions or ulcers Psychiatry: Judgement and insight appear normal. Mood & affect appropriate.     Data Reviewed:   CBC:  Recent Labs Lab 07/04/16 1211 07/05/16 0417  WBC 11.0* 10.0  NEUTROABS 8.4*  --   HGB 12.8 10.8*  HCT 38.1 32.7*  MCV 86.2 85.8  PLT 235 423   Basic Metabolic Panel:  Recent Labs Lab 07/04/16 1211  NA 138  K 4.6  CL 104  CO2 22  GLUCOSE 115*  BUN 16  CREATININE 1.64*  CALCIUM 9.5   GFR: Estimated Creatinine Clearance: 25.2 mL/min (by C-G formula based on SCr of 1.64 mg/dL (H)). Liver Function Tests:  Recent Labs Lab 07/04/16 1211  AST 18  ALT 9*  ALKPHOS 76  BILITOT 0.4  PROT 7.0  ALBUMIN 3.7    Recent Labs Lab 07/04/16 1855  LIPASE 16   No results for input(s): AMMONIA in the  last 168 hours. Coagulation Profile: No results for input(s): INR, PROTIME in the last 168 hours. Cardiac Enzymes: No results for input(s): CKTOTAL, CKMB, CKMBINDEX, TROPONINI in the last 168 hours. BNP (last 3 results) No results for input(s): PROBNP in the last 8760 hours. HbA1C: No results for input(s): HGBA1C in the last 72 hours. CBG:  Recent Labs Lab 07/05/16 0750 07/05/16 1212  GLUCAP 170* 85   Lipid Profile: No results for input(s): CHOL, HDL, LDLCALC, TRIG, CHOLHDL, LDLDIRECT in the last 72 hours. Thyroid Function Tests: No results for input(s): TSH, T4TOTAL, FREET4, T3FREE, THYROIDAB in the last 72 hours. Anemia Panel: No results for input(s): VITAMINB12, FOLATE, FERRITIN, TIBC, IRON, RETICCTPCT in the last 72 hours. Sepsis Labs:  Recent Labs Lab 07/04/16 1229 07/04/16 1645  LATICACIDVEN 2.41* 1.71    No results  found for this or any previous visit (from the past 240 hour(s)).       Radiology Studies: Dg Chest 2 View  Result Date: 07/04/2016 CLINICAL DATA:  Pneumonia, nausea, vomiting, cough, congestion, history hypertension, atrial fibrillation, former smoker EXAM: CHEST  2 VIEW COMPARISON:  A 09/2009 FINDINGS: Normal heart size post AVR. Mediastinal contours and pulmonary vascularity normal. Atherosclerotic calcification aorta. Lungs emphysematous with minimal central peribronchial thickening. No acute infiltrate, pleural effusion, or pneumothorax. Bones diffusely demineralized. IMPRESSION: Post AVR. Aortic atherosclerosis. COPD changes without acute infiltrate. Electronically Signed   By: Lavonia Dana M.D.   On: 07/04/2016 13:17        Scheduled Meds: . aspirin EC  81 mg Oral Daily  . insulin aspart  0-9 Units Subcutaneous TID WC  . lisinopril  20 mg Oral Daily  . pravastatin  40 mg Oral Daily   Continuous Infusions:   LOS: 0 days    Time spent: 35 mins     Wadie Mattie Arsenio Loader, MD [facility] Pager [phone]   If 7PM-7AM, please contact night-coverage [email or web address] Password [email or web address] 07/05/2016, 1:15 PM

## 2016-07-05 NOTE — Discharge Summary (Signed)
Physician Discharge Summary  LILLEE MOONEYHAN AQT:[id number] DOB: 12-Oct-1945 DOA: 07/04/2016  PCP: Tivis Ringer, MD  Admit date: 07/04/2016 Discharge date: 07/05/2016  Admitted From: Home Disposition: Home  Recommendations for Outpatient Follow-up:  1. Follow up with PCP in 1-2 weeks  Home Health: None Equipment/Devices:None Discharge Condition:Stable  CODE STATUS:DNI Diet recommendation: Diabetic   Brief/Interim Summary: Sandra Sheppard is a 71 y.o. woman with a history of valvular heart disease S/P bioprosthetic aortic valve replacement, paroxysmal atrial fibrillation (CHADS-Vasc score of 4, she is NOT anticoagulated), HTN, PVD, carotid disease, AAA, and CKD 3 who presents to the ED complaining of nausea, vomiting, diarrhea, and the inability to tolerate PO for the past two days.  Symptoms started on Saturday.  Of note, she completed a seven day course of antibiotics (name unknown) for a diagnosis of CAP on Sunday.  She denies fevers, chills, sweats, or abdominal pain.  When asked more specific questions about her symptoms, she admits that she has only had two episodes of nonbloody emesis and one episode of diarrhea (soft stool; it was not watery or bloody).  However, she has had decreased PO intake and no desire to eat until this evening, after receiving IV fluids in the Ed. In the ER her lactate was elevated with signs of clinical dehydration. Her symptoms started improving after getting IVF.  At first there were concerns of any sort of infectiously etiology of her diarrhea and studies were ordered to rule it out but she no longer had any more episodes of diarrhea while in the hospital. Her symptoms spontaneously resolved overnight and requested to be discharged the following day. She appears to be stable to be discharged. She is advised to follow up outpatient with her PCP within a week meanwhile keep her self well hydrated.   Discharge Diagnoses:  Principal Problem:    Gastroenteritis Active Problems:   HYPERCHOLESTEROLEMIA  IIA   STENOSIS, MITRAL AND AORTIC VALVES   HYPERTENSION, MILD   FIBRILLATION, ATRIAL   Occlusion and stenosis of carotid artery   Peripheral vascular disease (HCC)   H/O aortic valve replacement   Vomiting   CKD (chronic kidney disease) stage 3, GFR 30-59 ml/min  Probable self-limited gastroenteritis,  -No more symptoms since being admitted, she is tolerating oral diet at this time  -I have advised her to keep herself well hydrated and follow up with her PCP -antiemetics prn; prescription sent to her pharmacy.   HTN -restart home meds   Atrial fibrillation --Baby aspirin  Dyslipidemia --Statin  DM2 -restart home meds    Discharge Instructions   Allergies as of 07/05/2016      Reactions   Codeine    Rash, Nausea and Vomiting    Sulfonamide Derivatives    Penicillins Rash   Has patient had a PCN reaction causing immediate rash, facial/tongue/throat swelling, SOB or lightheadedness with hypotension: YES Has patient had a PCN reaction causing severe rash involving mucus membranes or skin necrosis:NO Has patient had a PCN reaction that required hospitalization NO Has patient had a PCN reaction occurring within the last 10 years: NO If all of the above answers are "NO", then may proceed with Cephalosporin use.      Medication List    TAKE these medications   aspirin 81 MG tablet Take 81 mg by mouth daily.   lisinopril-hydrochlorothiazide 20-25 MG tablet Commonly known as:  PRINZIDE,ZESTORETIC Take 1 tablet by mouth daily.   metFORMIN 500 MG 24 hr tablet Commonly known as:  GLUCOPHAGE-XR  Take 500 mg by mouth 2 (two) times daily.   multivitamin capsule Take 1 capsule by mouth daily.   ondansetron 4 MG tablet Commonly known as:  ZOFRAN Take 1 tablet (4 mg total) by mouth every 6 (six) hours as needed for nausea or vomiting.   pravastatin 40 MG tablet Commonly known as:  PRAVACHOL Take 40 mg by  mouth daily.   Vitamin D-3 1000 units Caps Take 1,000 Units by mouth daily.      Follow-up Information    AVVA,RAVISANKAR R, MD Follow up in 1 week(s).   Specialty:  Internal Medicine Contact information: [place] [address] [phone]          Allergies  Allergen Reactions  . Codeine     Rash, Nausea and Vomiting   . Sulfonamide Derivatives   . Penicillins Rash    Has patient had a PCN reaction causing immediate rash, facial/tongue/throat swelling, SOB or lightheadedness with hypotension: YES Has patient had a PCN reaction causing severe rash involving mucus membranes or skin necrosis:NO Has patient had a PCN reaction that required hospitalization NO Has patient had a PCN reaction occurring within the last 10 years: NO If all of the above answers are "NO", then may proceed with Cephalosporin use.    Consultations: None  Procedures/Studies: Dg Chest 2 View  Result Date: 07/04/2016 CLINICAL DATA:  Pneumonia, nausea, vomiting, cough, congestion, history hypertension, atrial fibrillation, former smoker EXAM: CHEST  2 VIEW COMPARISON:  A 09/2009 FINDINGS: Normal heart size post AVR. Mediastinal contours and pulmonary vascularity normal. Atherosclerotic calcification aorta. Lungs emphysematous with minimal central peribronchial thickening. No acute infiltrate, pleural effusion, or pneumothorax. Bones diffusely demineralized. IMPRESSION: Post AVR. Aortic atherosclerosis. COPD changes without acute infiltrate. Electronically Signed   By: Lavonia Dana M.D.   On: 07/04/2016 13:17       Subjective:   Discharge Exam: Vitals:   07/05/16 1002 07/05/16 1200  BP: 154/62 177/67  Pulse: 69 79  Resp: 14   Temp:     Vitals:   07/05/16 0800 07/05/16 1000 07/05/16 1002 07/05/16 1200  BP: 142/65 154/62 154/62 177/67  Pulse: 71 72 69 79  Resp: 20  14   Temp:      TempSrc:      SpO2: 96% 94% 94% 97%  Weight:      Height:        General: Pt is alert,  awake, not in acute distress Cardiovascular: RRR, S1/S2 +, no rubs, no gallops Respiratory: CTA bilaterally, no wheezing, no rhonchi Abdominal: Soft, NT, ND, bowel sounds + Extremities: no edema, no cyanosis    The results of significant diagnostics from this hospitalization (including imaging, microbiology, ancillary and laboratory) are listed below for reference.     Microbiology: No results found for this or any previous visit (from the past 240 hour(s)).   Labs: BNP (last 3 results) No results for input(s): BNP in the last 8760 hours. Basic Metabolic Panel:  Recent Labs Lab 07/04/16 1211  NA 138  K 4.6  CL 104  CO2 22  GLUCOSE 115*  BUN 16  CREATININE 1.64*  CALCIUM 9.5   Liver Function Tests:  Recent Labs Lab 07/04/16 1211  AST 18  ALT 9*  ALKPHOS 76  BILITOT 0.4  PROT 7.0  ALBUMIN 3.7    Recent Labs Lab 07/04/16 1855  LIPASE 16   No results for input(s): AMMONIA in the last 168 hours. CBC:  Recent Labs Lab 07/04/16 1211 07/05/16 0417  WBC 11.0* 10.0  NEUTROABS 8.4*  --   HGB 12.8 10.8*  HCT 38.1 32.7*  MCV 86.2 85.8  PLT 235 185   Cardiac Enzymes: No results for input(s): CKTOTAL, CKMB, CKMBINDEX, TROPONINI in the last 168 hours. BNP: Invalid input(s): POCBNP CBG:  Recent Labs Lab 07/05/16 0750 07/05/16 1212  GLUCAP 170* 85   D-Dimer No results for input(s): DDIMER in the last 72 hours. Hgb A1c No results for input(s): HGBA1C in the last 72 hours. Lipid Profile No results for input(s): CHOL, HDL, LDLCALC, TRIG, CHOLHDL, LDLDIRECT in the last 72 hours. Thyroid function studies No results for input(s): TSH, T4TOTAL, T3FREE, THYROIDAB in the last 72 hours.  Invalid input(s): FREET3 Anemia work up No results for input(s): VITAMINB12, FOLATE, FERRITIN, TIBC, IRON, RETICCTPCT in the last 72 hours. Urinalysis    Component Value Date/Time   COLORURINE YELLOW 07/04/2016 1750   APPEARANCEUR CLEAR 07/04/2016 1750   LABSPEC 1.008  07/04/2016 1750   PHURINE 5.0 07/04/2016 1750   GLUCOSEU NEGATIVE 07/04/2016 1750   HGBUR NEGATIVE 07/04/2016 1750   BILIRUBINUR NEGATIVE 07/04/2016 1750   KETONESUR NEGATIVE 07/04/2016 1750   PROTEINUR NEGATIVE 07/04/2016 1750   UROBILINOGEN 0.2 01/14/2010 1308   NITRITE NEGATIVE 07/04/2016 1750   LEUKOCYTESUR NEGATIVE 07/04/2016 1750   Sepsis Labs Invalid input(s): PROCALCITONIN,  WBC,  LACTICIDVEN Microbiology No results found for this or any previous visit (from the past 240 hour(s)).   Time coordinating discharge: Over 30 minutes  SIGNED:   Damita Lack, MD  [facility] 07/05/2016, 2:13 PM Pager   If 7PM-7AM, please contact night-coverage [email or web address] Password [email or web address]

## 2016-07-05 NOTE — ED Notes (Signed)
Heart healthy carb modified lunch tray ordered for pt.

## 2016-07-05 NOTE — Care Management Obs Status (Signed)
[place] NOTIFICATION   Patient Details  Name: Sandra Sheppard MRN: [id number] Date of Birth: [birth year]   Medicare Observation Status Notification Given:  Yes    Vergie Living, RN 07/05/2016, 2:00 PM

## 2016-07-05 NOTE — ED Notes (Signed)
MD at bedside. 

## 2016-07-05 NOTE — ED Notes (Signed)
Diet coke given to pt per Nicole(RN)

## 2016-07-05 NOTE — ED Notes (Signed)
Pt is in stable condition upon d/c and is escorted from ED via wheelchair. 

## 2016-07-12 DIAGNOSIS — J189 Pneumonia, unspecified organism: Secondary | ICD-10-CM | POA: Diagnosis not present

## 2016-08-03 DIAGNOSIS — J189 Pneumonia, unspecified organism: Secondary | ICD-10-CM | POA: Diagnosis not present

## 2016-08-03 DIAGNOSIS — D509 Iron deficiency anemia, unspecified: Secondary | ICD-10-CM | POA: Diagnosis not present

## 2016-08-03 DIAGNOSIS — E1059 Type 1 diabetes mellitus with other circulatory complications: Secondary | ICD-10-CM | POA: Diagnosis not present

## 2016-08-03 DIAGNOSIS — R05 Cough: Secondary | ICD-10-CM | POA: Diagnosis not present

## 2016-08-03 DIAGNOSIS — I10 Essential (primary) hypertension: Secondary | ICD-10-CM | POA: Diagnosis not present

## 2016-09-20 DIAGNOSIS — E119 Type 2 diabetes mellitus without complications: Secondary | ICD-10-CM | POA: Diagnosis not present

## 2016-09-20 DIAGNOSIS — Z681 Body mass index (BMI) 19 or less, adult: Secondary | ICD-10-CM | POA: Diagnosis not present

## 2016-09-20 DIAGNOSIS — N183 Chronic kidney disease, stage 3 (moderate): Secondary | ICD-10-CM | POA: Diagnosis not present

## 2016-09-20 DIAGNOSIS — I714 Abdominal aortic aneurysm, without rupture: Secondary | ICD-10-CM | POA: Diagnosis not present

## 2016-09-20 DIAGNOSIS — I6529 Occlusion and stenosis of unspecified carotid artery: Secondary | ICD-10-CM | POA: Diagnosis not present

## 2016-09-20 DIAGNOSIS — I10 Essential (primary) hypertension: Secondary | ICD-10-CM | POA: Diagnosis not present

## 2016-09-20 DIAGNOSIS — R42 Dizziness and giddiness: Secondary | ICD-10-CM | POA: Diagnosis not present

## 2016-09-20 DIAGNOSIS — Z1389 Encounter for screening for other disorder: Secondary | ICD-10-CM | POA: Diagnosis not present

## 2016-09-20 DIAGNOSIS — Z952 Presence of prosthetic heart valve: Secondary | ICD-10-CM | POA: Diagnosis not present

## 2016-09-20 DIAGNOSIS — I480 Paroxysmal atrial fibrillation: Secondary | ICD-10-CM | POA: Diagnosis not present

## 2016-12-20 DIAGNOSIS — E785 Hyperlipidemia, unspecified: Secondary | ICD-10-CM | POA: Diagnosis not present

## 2016-12-20 DIAGNOSIS — Z681 Body mass index (BMI) 19 or less, adult: Secondary | ICD-10-CM | POA: Diagnosis not present

## 2016-12-20 DIAGNOSIS — Q239 Congenital malformation of aortic and mitral valves, unspecified: Secondary | ICD-10-CM | POA: Diagnosis not present

## 2016-12-20 DIAGNOSIS — I10 Essential (primary) hypertension: Secondary | ICD-10-CM | POA: Diagnosis not present

## 2016-12-20 DIAGNOSIS — Z7984 Long term (current) use of oral hypoglycemic drugs: Secondary | ICD-10-CM | POA: Diagnosis not present

## 2016-12-20 DIAGNOSIS — E119 Type 2 diabetes mellitus without complications: Secondary | ICD-10-CM | POA: Diagnosis not present

## 2016-12-20 DIAGNOSIS — Z972 Presence of dental prosthetic device (complete) (partial): Secondary | ICD-10-CM | POA: Diagnosis not present

## 2016-12-20 DIAGNOSIS — Z7901 Long term (current) use of anticoagulants: Secondary | ICD-10-CM | POA: Diagnosis not present

## 2016-12-20 DIAGNOSIS — Z0000 Encounter for general adult medical examination without abnormal findings: Secondary | ICD-10-CM | POA: Diagnosis not present

## 2016-12-20 DIAGNOSIS — Z952 Presence of prosthetic heart valve: Secondary | ICD-10-CM | POA: Diagnosis not present

## 2017-01-06 DIAGNOSIS — I10 Essential (primary) hypertension: Secondary | ICD-10-CM | POA: Diagnosis not present

## 2017-01-06 DIAGNOSIS — I714 Abdominal aortic aneurysm, without rupture: Secondary | ICD-10-CM | POA: Diagnosis not present

## 2017-01-06 DIAGNOSIS — Z681 Body mass index (BMI) 19 or less, adult: Secondary | ICD-10-CM | POA: Diagnosis not present

## 2017-01-06 DIAGNOSIS — E119 Type 2 diabetes mellitus without complications: Secondary | ICD-10-CM | POA: Diagnosis not present

## 2017-01-06 DIAGNOSIS — N183 Chronic kidney disease, stage 3 (moderate): Secondary | ICD-10-CM | POA: Diagnosis not present

## 2017-03-06 DIAGNOSIS — S60922A Unspecified superficial injury of left hand, initial encounter: Secondary | ICD-10-CM | POA: Diagnosis not present

## 2017-03-06 DIAGNOSIS — W548XXA Other contact with dog, initial encounter: Secondary | ICD-10-CM | POA: Diagnosis not present

## 2017-03-06 DIAGNOSIS — Z681 Body mass index (BMI) 19 or less, adult: Secondary | ICD-10-CM | POA: Diagnosis not present

## 2017-04-21 DIAGNOSIS — N3281 Overactive bladder: Secondary | ICD-10-CM | POA: Diagnosis not present

## 2017-04-21 DIAGNOSIS — I7300 Raynaud's syndrome without gangrene: Secondary | ICD-10-CM | POA: Diagnosis not present

## 2017-04-21 DIAGNOSIS — Z681 Body mass index (BMI) 19 or less, adult: Secondary | ICD-10-CM | POA: Diagnosis not present

## 2017-04-21 DIAGNOSIS — E1059 Type 1 diabetes mellitus with other circulatory complications: Secondary | ICD-10-CM | POA: Diagnosis not present

## 2017-04-21 DIAGNOSIS — R8290 Unspecified abnormal findings in urine: Secondary | ICD-10-CM | POA: Diagnosis not present

## 2017-04-21 DIAGNOSIS — I10 Essential (primary) hypertension: Secondary | ICD-10-CM | POA: Diagnosis not present

## 2017-04-21 DIAGNOSIS — Z23 Encounter for immunization: Secondary | ICD-10-CM | POA: Diagnosis not present

## 2017-04-21 DIAGNOSIS — N183 Chronic kidney disease, stage 3 (moderate): Secondary | ICD-10-CM | POA: Diagnosis not present

## 2017-04-21 DIAGNOSIS — I6529 Occlusion and stenosis of unspecified carotid artery: Secondary | ICD-10-CM | POA: Diagnosis not present

## 2017-04-21 DIAGNOSIS — N390 Urinary tract infection, site not specified: Secondary | ICD-10-CM | POA: Diagnosis not present

## 2017-04-21 DIAGNOSIS — E119 Type 2 diabetes mellitus without complications: Secondary | ICD-10-CM | POA: Diagnosis not present

## 2017-05-19 ENCOUNTER — Encounter (INDEPENDENT_AMBULATORY_CARE_PROVIDER_SITE_OTHER): Payer: Self-pay | Admitting: Ophthalmology

## 2017-05-19 DIAGNOSIS — I10 Essential (primary) hypertension: Secondary | ICD-10-CM | POA: Diagnosis not present

## 2017-05-19 DIAGNOSIS — M810 Age-related osteoporosis without current pathological fracture: Secondary | ICD-10-CM | POA: Diagnosis not present

## 2017-05-19 DIAGNOSIS — E782 Mixed hyperlipidemia: Secondary | ICD-10-CM | POA: Diagnosis not present

## 2017-05-19 DIAGNOSIS — R82998 Other abnormal findings in urine: Secondary | ICD-10-CM | POA: Diagnosis not present

## 2017-05-19 DIAGNOSIS — E1059 Type 1 diabetes mellitus with other circulatory complications: Secondary | ICD-10-CM | POA: Diagnosis not present

## 2017-05-22 ENCOUNTER — Encounter (INDEPENDENT_AMBULATORY_CARE_PROVIDER_SITE_OTHER): Payer: Self-pay | Admitting: Ophthalmology

## 2017-05-26 ENCOUNTER — Ambulatory Visit (HOSPITAL_COMMUNITY)
Admission: RE | Admit: 2017-05-26 | Discharge: 2017-05-26 | Disposition: A | Discharge status: Home / Self Care | Payer: Medicare HMO | Source: Ambulatory Visit | Attending: Cardiovascular Disease | Admitting: Cardiovascular Disease

## 2017-05-26 ENCOUNTER — Other Ambulatory Visit: Payer: Self-pay

## 2017-05-26 ENCOUNTER — Ambulatory Visit (HOSPITAL_BASED_OUTPATIENT_CLINIC_OR_DEPARTMENT_OTHER): Payer: Medicare HMO

## 2017-05-26 DIAGNOSIS — I10 Essential (primary) hypertension: Secondary | ICD-10-CM | POA: Diagnosis not present

## 2017-05-26 DIAGNOSIS — Z87891 Personal history of nicotine dependence: Secondary | ICD-10-CM | POA: Insufficient documentation

## 2017-05-26 DIAGNOSIS — Z952 Presence of prosthetic heart valve: Secondary | ICD-10-CM

## 2017-05-26 DIAGNOSIS — E782 Mixed hyperlipidemia: Secondary | ICD-10-CM | POA: Diagnosis not present

## 2017-05-26 DIAGNOSIS — N183 Chronic kidney disease, stage 3 (moderate): Secondary | ICD-10-CM | POA: Diagnosis not present

## 2017-05-26 DIAGNOSIS — R011 Cardiac murmur, unspecified: Secondary | ICD-10-CM | POA: Insufficient documentation

## 2017-05-26 DIAGNOSIS — I4891 Unspecified atrial fibrillation: Secondary | ICD-10-CM | POA: Diagnosis not present

## 2017-05-26 DIAGNOSIS — Z48812 Encounter for surgical aftercare following surgery on the circulatory system: Secondary | ICD-10-CM | POA: Diagnosis not present

## 2017-05-26 DIAGNOSIS — I739 Peripheral vascular disease, unspecified: Secondary | ICD-10-CM | POA: Insufficient documentation

## 2017-05-26 DIAGNOSIS — Z23 Encounter for immunization: Secondary | ICD-10-CM | POA: Diagnosis not present

## 2017-05-26 DIAGNOSIS — I6529 Occlusion and stenosis of unspecified carotid artery: Secondary | ICD-10-CM | POA: Diagnosis not present

## 2017-05-26 DIAGNOSIS — Z0000 Encounter for general adult medical examination without abnormal findings: Secondary | ICD-10-CM | POA: Diagnosis not present

## 2017-05-26 DIAGNOSIS — E785 Hyperlipidemia, unspecified: Secondary | ICD-10-CM | POA: Diagnosis not present

## 2017-05-26 DIAGNOSIS — I714 Abdominal aortic aneurysm, without rupture, unspecified: Secondary | ICD-10-CM

## 2017-05-26 DIAGNOSIS — I480 Paroxysmal atrial fibrillation: Secondary | ICD-10-CM | POA: Diagnosis not present

## 2017-05-26 DIAGNOSIS — R808 Other proteinuria: Secondary | ICD-10-CM | POA: Diagnosis not present

## 2017-05-26 DIAGNOSIS — E1059 Type 1 diabetes mellitus with other circulatory complications: Secondary | ICD-10-CM | POA: Diagnosis not present

## 2017-05-29 ENCOUNTER — Telehealth: Payer: Self-pay | Admitting: *Deleted

## 2017-05-29 DIAGNOSIS — I714 Abdominal aortic aneurysm, without rupture, unspecified: Secondary | ICD-10-CM

## 2017-05-29 NOTE — Telephone Encounter (Signed)
AAA ordered for 1 Year

## 2017-05-29 NOTE — Telephone Encounter (Signed)
-----   Message from Minus Breeding, MD sent at 05/27/2017 10:18 AM EST ----- There is evidence of abnormal dilitation of the Mid Abdominal aorta. The largest aortic measurement is 3.3 cm. The largest aortic diameter has increased compared to prior exam. Previous diameter measurement was 3.0 cm obtained on 12/17.  Repeat study in one year.  Call Ms. Lippmann with the results and send results to Prince Solian, MD

## 2017-05-31 ENCOUNTER — Encounter (INDEPENDENT_AMBULATORY_CARE_PROVIDER_SITE_OTHER): Payer: Medicare HMO | Admitting: Ophthalmology

## 2017-05-31 DIAGNOSIS — I10 Essential (primary) hypertension: Secondary | ICD-10-CM

## 2017-05-31 DIAGNOSIS — H353132 Nonexudative age-related macular degeneration, bilateral, intermediate dry stage: Secondary | ICD-10-CM

## 2017-05-31 DIAGNOSIS — H43813 Vitreous degeneration, bilateral: Secondary | ICD-10-CM

## 2017-05-31 DIAGNOSIS — H2513 Age-related nuclear cataract, bilateral: Secondary | ICD-10-CM | POA: Diagnosis not present

## 2017-05-31 DIAGNOSIS — H35033 Hypertensive retinopathy, bilateral: Secondary | ICD-10-CM | POA: Diagnosis not present

## 2017-06-20 ENCOUNTER — Other Ambulatory Visit: Payer: Self-pay | Admitting: Cardiology

## 2017-06-20 DIAGNOSIS — I6523 Occlusion and stenosis of bilateral carotid arteries: Secondary | ICD-10-CM

## 2017-06-27 NOTE — Progress Notes (Signed)
HPI The patient presents for followup after aortic valve replacement. Since I last saw her she has done well.  The patient denies any new symptoms such as chest discomfort, neck or arm discomfort. There has been no new shortness of breath, PND or orthopnea. There have been no reported palpitations, presyncope or syncope.  She is active around the house.     Allergies  Allergen Reactions  . Codeine     Rash, Nausea and Vomiting   . Sulfonamide Derivatives   . Penicillins Rash    Has patient had a PCN reaction causing immediate rash, facial/tongue/throat swelling, SOB or lightheadedness with hypotension: YES Has patient had a PCN reaction causing severe rash involving mucus membranes or skin necrosis:NO Has patient had a PCN reaction that required hospitalization NO Has patient had a PCN reaction occurring within the last 10 years: NO If all of the above answers are "NO", then may proceed with Cephalosporin use.    Current Outpatient Medications  Medication Sig Dispense Refill  . aspirin 81 MG tablet Take 81 mg by mouth daily.      . Cholecalciferol (VITAMIN D-3) 1000 UNITS CAPS Take 1,000 Units by mouth daily.    Marland Kitchen lisinopril-hydrochlorothiazide (PRINZIDE,ZESTORETIC) 20-25 MG per tablet Take 1 tablet by mouth daily. 30 tablet 11  . metFORMIN (GLUCOPHAGE-XR) 500 MG 24 hr tablet Take 500 mg by mouth 2 (two) times daily.    . Multiple Vitamin (MULTIVITAMIN) capsule Take 1 capsule by mouth daily.      . ondansetron (ZOFRAN) 4 MG tablet Take 1 tablet (4 mg total) by mouth every 6 (six) hours as needed for nausea or vomiting. 20 tablet 0  . pravastatin (PRAVACHOL) 40 MG tablet Take 40 mg by mouth daily.       No current facility-administered medications for this visit.     Past Medical History:  Diagnosis Date  . ABDOMINAL AORTIC ANEURYSM   . CAROTID STENOSIS   . FIBRILLATION, ATRIAL   . HYPERCHOLESTEROLEMIA  IIA   . HYPERTENSION, MILD   . MURMUR   . PERIPHERAL VASCULAR DISEASE     . STENOSIS, MITRAL AND AORTIC VALVES     Past Surgical History:  Procedure Laterality Date  . AORTIC VALVE REPLACEMENT    . TOTAL ABDOMINAL HYSTERECTOMY      ROS:    As stated in the HPI and negative for all other systems.  PHYSICAL EXAM BP (!) 157/75   Pulse (!) 56   Ht 5' 4.5" (1.638 m)   Wt 114 lb 3.2 oz (51.8 kg)   BMI 19.30 kg/m   GENERAL:  Well appearing NECK:  No jugular venous distention, waveform within normal limits, carotid upstroke brisk and symmetric, bilateral carotid bruits, no thyromegaly LUNGS:  Clear to auscultation bilaterally CHEST:  Well healed sternotomy scar. HEART:  PMI not displaced or sustained,S1 and S2 within normal limits, no S3, no S4, no clicks, no rubs, 3 of 6 apical systolic murmur radiating slightly at the aortic outflow tract, mid peaking, no diastolic murmurs ABD:  Flat, positive bowel sounds normal in frequency in pitch, no bruits, no rebound, no guarding, no midline pulsatile mass, no hepatomegaly, no splenomegaly EXT:  2 plus pulses upper, decreased dorsalis pedis and posterior tibialis bilaterally, diminished left radial, no edema, no cyanosis no clubbing   EKG:  Sinus rhythm, rate 56, axis within normal limits, intervals within normal limits, nonspecific anterolateral T-wave flattening.  No change from previous. 06/29/2017   ASSESSMENT AND PLAN  AORTIC VALVE REPLACEMENT, HX OF - There was mild stenosis in Dec of last year.  No change in therapy is indicated.  I will follow this clinically and with repeat echoes.   ABDOMINAL AORTIC ANEURYSM - This was 3.3 x 3 in Dec.  No change in therapy.  No further imaging.  DYSLIPIDEMIA - Her LDL was 69 with an HDL of 33.  This is followed by Prince Solian, MD.     HYPERTENSION, MILD -  She was recently  increased on her blood pressure medication.  I will defer to Dr. Elsworth Soho.  BP is not at target and she might be need the addition of another agent such as Norvasc.  TOBACCO USE - She says she  is still not smoking.   CAROTID STENOSIS - This was mild in Dec 2017.  she had carotid Dopplers today and I will follow-up the results.  Thank you

## 2017-06-29 ENCOUNTER — Ambulatory Visit: Payer: Medicare HMO | Admitting: Cardiology

## 2017-06-29 ENCOUNTER — Ambulatory Visit (HOSPITAL_COMMUNITY)
Admission: RE | Admit: 2017-06-29 | Discharge: 2017-06-29 | Disposition: A | Discharge status: Home / Self Care | Payer: Medicare HMO | Source: Ambulatory Visit | Attending: Cardiology | Admitting: Cardiology

## 2017-06-29 ENCOUNTER — Encounter: Payer: Self-pay | Admitting: Cardiology

## 2017-06-29 ENCOUNTER — Encounter (INDEPENDENT_AMBULATORY_CARE_PROVIDER_SITE_OTHER): Payer: Self-pay

## 2017-06-29 VITALS — BP 157/75 | HR 56 | Ht 64.5 in | Wt 114.2 lb

## 2017-06-29 DIAGNOSIS — I714 Abdominal aortic aneurysm, without rupture, unspecified: Secondary | ICD-10-CM

## 2017-06-29 DIAGNOSIS — I6523 Occlusion and stenosis of bilateral carotid arteries: Secondary | ICD-10-CM

## 2017-06-29 DIAGNOSIS — I350 Nonrheumatic aortic (valve) stenosis: Secondary | ICD-10-CM | POA: Diagnosis not present

## 2017-06-29 NOTE — Patient Instructions (Signed)
Medication Instructions:  Continue current medications  If you need a refill on your cardiac medications before your next appointment, please call your pharmacy.  Labwork: None Ordered   Testing/Procedures: None Ordered  Follow-Up: Your physician wants you to follow-up in: 1 Year. You should receive a reminder letter in the mail two months in advance. If you do not receive a letter, please call our office [phone].    Thank you for choosing [facility]!!      

## 2017-08-01 DIAGNOSIS — Z681 Body mass index (BMI) 19 or less, adult: Secondary | ICD-10-CM | POA: Diagnosis not present

## 2017-08-01 DIAGNOSIS — M25551 Pain in right hip: Secondary | ICD-10-CM | POA: Diagnosis not present

## 2017-08-01 DIAGNOSIS — W19XXXA Unspecified fall, initial encounter: Secondary | ICD-10-CM | POA: Diagnosis not present

## 2017-10-12 DIAGNOSIS — I10 Essential (primary) hypertension: Secondary | ICD-10-CM | POA: Diagnosis not present

## 2017-10-17 DIAGNOSIS — Z681 Body mass index (BMI) 19 or less, adult: Secondary | ICD-10-CM | POA: Diagnosis not present

## 2017-10-17 DIAGNOSIS — E1059 Type 1 diabetes mellitus with other circulatory complications: Secondary | ICD-10-CM | POA: Diagnosis not present

## 2017-10-17 DIAGNOSIS — Z1389 Encounter for screening for other disorder: Secondary | ICD-10-CM | POA: Diagnosis not present

## 2017-10-17 DIAGNOSIS — I714 Abdominal aortic aneurysm, without rupture: Secondary | ICD-10-CM | POA: Diagnosis not present

## 2017-10-17 DIAGNOSIS — Z952 Presence of prosthetic heart valve: Secondary | ICD-10-CM | POA: Diagnosis not present

## 2017-10-17 DIAGNOSIS — I6529 Occlusion and stenosis of unspecified carotid artery: Secondary | ICD-10-CM | POA: Diagnosis not present

## 2017-10-17 DIAGNOSIS — I10 Essential (primary) hypertension: Secondary | ICD-10-CM | POA: Diagnosis not present

## 2017-10-17 DIAGNOSIS — D509 Iron deficiency anemia, unspecified: Secondary | ICD-10-CM | POA: Diagnosis not present

## 2017-10-17 DIAGNOSIS — M810 Age-related osteoporosis without current pathological fracture: Secondary | ICD-10-CM | POA: Diagnosis not present

## 2017-10-17 DIAGNOSIS — N183 Chronic kidney disease, stage 3 (moderate): Secondary | ICD-10-CM | POA: Diagnosis not present

## 2018-01-02 DIAGNOSIS — R05 Cough: Secondary | ICD-10-CM | POA: Diagnosis not present

## 2018-01-02 DIAGNOSIS — I10 Essential (primary) hypertension: Secondary | ICD-10-CM | POA: Diagnosis not present

## 2018-01-02 DIAGNOSIS — H6691 Otitis media, unspecified, right ear: Secondary | ICD-10-CM | POA: Diagnosis not present

## 2018-01-02 DIAGNOSIS — Z681 Body mass index (BMI) 19 or less, adult: Secondary | ICD-10-CM | POA: Diagnosis not present

## 2018-01-02 DIAGNOSIS — J069 Acute upper respiratory infection, unspecified: Secondary | ICD-10-CM | POA: Diagnosis not present

## 2018-02-13 DIAGNOSIS — I10 Essential (primary) hypertension: Secondary | ICD-10-CM | POA: Diagnosis not present

## 2018-02-13 DIAGNOSIS — R7303 Prediabetes: Secondary | ICD-10-CM | POA: Diagnosis not present

## 2018-02-13 DIAGNOSIS — Z7722 Contact with and (suspected) exposure to environmental tobacco smoke (acute) (chronic): Secondary | ICD-10-CM | POA: Diagnosis not present

## 2018-02-13 DIAGNOSIS — Z7982 Long term (current) use of aspirin: Secondary | ICD-10-CM | POA: Diagnosis not present

## 2018-02-13 DIAGNOSIS — E785 Hyperlipidemia, unspecified: Secondary | ICD-10-CM | POA: Diagnosis not present

## 2018-02-13 DIAGNOSIS — Z87891 Personal history of nicotine dependence: Secondary | ICD-10-CM | POA: Diagnosis not present

## 2018-02-13 DIAGNOSIS — Z7984 Long term (current) use of oral hypoglycemic drugs: Secondary | ICD-10-CM | POA: Diagnosis not present

## 2018-02-13 DIAGNOSIS — Z880 Allergy status to penicillin: Secondary | ICD-10-CM | POA: Diagnosis not present

## 2018-02-13 DIAGNOSIS — J309 Allergic rhinitis, unspecified: Secondary | ICD-10-CM | POA: Diagnosis not present

## 2018-02-20 DIAGNOSIS — Z681 Body mass index (BMI) 19 or less, adult: Secondary | ICD-10-CM | POA: Diagnosis not present

## 2018-02-20 DIAGNOSIS — N183 Chronic kidney disease, stage 3 (moderate): Secondary | ICD-10-CM | POA: Diagnosis not present

## 2018-02-20 DIAGNOSIS — R5383 Other fatigue: Secondary | ICD-10-CM | POA: Diagnosis not present

## 2018-02-20 DIAGNOSIS — I10 Essential (primary) hypertension: Secondary | ICD-10-CM | POA: Diagnosis not present

## 2018-02-20 DIAGNOSIS — D508 Other iron deficiency anemias: Secondary | ICD-10-CM | POA: Diagnosis not present

## 2018-02-20 DIAGNOSIS — J302 Other seasonal allergic rhinitis: Secondary | ICD-10-CM | POA: Diagnosis not present

## 2018-02-20 DIAGNOSIS — J069 Acute upper respiratory infection, unspecified: Secondary | ICD-10-CM | POA: Diagnosis not present

## 2018-02-20 DIAGNOSIS — E1059 Type 1 diabetes mellitus with other circulatory complications: Secondary | ICD-10-CM | POA: Diagnosis not present

## 2018-04-12 DIAGNOSIS — Z23 Encounter for immunization: Secondary | ICD-10-CM | POA: Diagnosis not present

## 2018-05-04 DIAGNOSIS — H524 Presbyopia: Secondary | ICD-10-CM | POA: Diagnosis not present

## 2018-05-04 DIAGNOSIS — E119 Type 2 diabetes mellitus without complications: Secondary | ICD-10-CM | POA: Diagnosis not present

## 2018-05-04 DIAGNOSIS — H5203 Hypermetropia, bilateral: Secondary | ICD-10-CM | POA: Diagnosis not present

## 2018-05-04 DIAGNOSIS — H52223 Regular astigmatism, bilateral: Secondary | ICD-10-CM | POA: Diagnosis not present

## 2018-06-29 ENCOUNTER — Encounter: Payer: Self-pay | Admitting: Cardiology

## 2018-06-29 DIAGNOSIS — E1059 Type 1 diabetes mellitus with other circulatory complications: Secondary | ICD-10-CM | POA: Diagnosis not present

## 2018-06-29 DIAGNOSIS — I10 Essential (primary) hypertension: Secondary | ICD-10-CM | POA: Diagnosis not present

## 2018-06-29 DIAGNOSIS — R5383 Other fatigue: Secondary | ICD-10-CM | POA: Diagnosis not present

## 2018-06-29 DIAGNOSIS — M810 Age-related osteoporosis without current pathological fracture: Secondary | ICD-10-CM | POA: Diagnosis not present

## 2018-06-29 DIAGNOSIS — R82998 Other abnormal findings in urine: Secondary | ICD-10-CM | POA: Diagnosis not present

## 2018-07-06 ENCOUNTER — Other Ambulatory Visit: Payer: Self-pay | Admitting: Internal Medicine

## 2018-07-06 ENCOUNTER — Telehealth: Payer: Self-pay | Admitting: Cardiology

## 2018-07-06 DIAGNOSIS — Z952 Presence of prosthetic heart valve: Secondary | ICD-10-CM | POA: Diagnosis not present

## 2018-07-06 DIAGNOSIS — Z1231 Encounter for screening mammogram for malignant neoplasm of breast: Secondary | ICD-10-CM

## 2018-07-06 DIAGNOSIS — I6529 Occlusion and stenosis of unspecified carotid artery: Secondary | ICD-10-CM | POA: Diagnosis not present

## 2018-07-06 DIAGNOSIS — Z0000 Encounter for general adult medical examination without abnormal findings: Secondary | ICD-10-CM | POA: Diagnosis not present

## 2018-07-06 DIAGNOSIS — I10 Essential (primary) hypertension: Secondary | ICD-10-CM | POA: Diagnosis not present

## 2018-07-06 DIAGNOSIS — I714 Abdominal aortic aneurysm, without rupture: Secondary | ICD-10-CM | POA: Diagnosis not present

## 2018-07-06 DIAGNOSIS — E782 Mixed hyperlipidemia: Secondary | ICD-10-CM | POA: Diagnosis not present

## 2018-07-06 DIAGNOSIS — N183 Chronic kidney disease, stage 3 (moderate): Secondary | ICD-10-CM | POA: Diagnosis not present

## 2018-07-06 DIAGNOSIS — E1159 Type 2 diabetes mellitus with other circulatory complications: Secondary | ICD-10-CM | POA: Diagnosis not present

## 2018-07-06 DIAGNOSIS — R808 Other proteinuria: Secondary | ICD-10-CM | POA: Diagnosis not present

## 2018-07-06 DIAGNOSIS — I480 Paroxysmal atrial fibrillation: Secondary | ICD-10-CM | POA: Diagnosis not present

## 2018-07-06 NOTE — Telephone Encounter (Signed)
Received records from [facility] on 07/06/18, Appt 07/12/18 @ 10:20AM.NV

## 2018-07-10 ENCOUNTER — Other Ambulatory Visit: Payer: Self-pay | Admitting: Internal Medicine

## 2018-07-10 DIAGNOSIS — N183 Chronic kidney disease, stage 3 unspecified: Secondary | ICD-10-CM

## 2018-07-11 NOTE — Progress Notes (Signed)
HPI The patient presents for followup after aortic valve replacement. Since I last saw her she has she has done very well.  She walks her dog.  The patient denies any new symptoms such as chest discomfort, neck or arm discomfort. There has been no new shortness of breath, PND or orthopnea. There have been no reported palpitations, presyncope or syncope.    Allergies  Allergen Reactions  . Codeine     Rash, Nausea and Vomiting   . Sulfonamide Derivatives   . Penicillins Rash    Has patient had a PCN reaction causing immediate rash, facial/tongue/throat swelling, SOB or lightheadedness with hypotension: YES Has patient had a PCN reaction causing severe rash involving mucus membranes or skin necrosis:NO Has patient had a PCN reaction that required hospitalization NO Has patient had a PCN reaction occurring within the last 10 years: NO If all of the above answers are "NO", then may proceed with Cephalosporin use.    Current Outpatient Medications  Medication Sig Dispense Refill  . amLODipine (NORVASC) 5 MG tablet Take 5 mg by mouth daily.    Marland Kitchen aspirin 81 MG tablet Take 81 mg by mouth daily.      . Cholecalciferol (VITAMIN D-3) 1000 UNITS CAPS Take 1,000 Units by mouth daily.    . fluticasone (FLONASE) 50 MCG/ACT nasal spray Place 1-2 sprays into both nostrils as needed.    Marland Kitchen lisinopril-hydrochlorothiazide (PRINZIDE,ZESTORETIC) 20-25 MG per tablet Take 1 tablet by mouth daily. 30 tablet 11  . metFORMIN (GLUCOPHAGE-XR) 500 MG 24 hr tablet Take 500 mg by mouth 2 (two) times daily.    . Multiple Vitamin (MULTIVITAMIN) capsule Take 1 capsule by mouth daily.      . pravastatin (PRAVACHOL) 40 MG tablet Take 40 mg by mouth daily.       No current facility-administered medications for this visit.     Past Medical History:  Diagnosis Date  . ABDOMINAL AORTIC ANEURYSM   . CAROTID STENOSIS   . FIBRILLATION, ATRIAL   . HYPERCHOLESTEROLEMIA  IIA   . HYPERTENSION, MILD   . MURMUR   .  PERIPHERAL VASCULAR DISEASE   . STENOSIS, MITRAL AND AORTIC VALVES     Past Surgical History:  Procedure Laterality Date  . AORTIC VALVE REPLACEMENT    . TOTAL ABDOMINAL HYSTERECTOMY      ROS:    Weight loss.  Otherwise as stated in the HPI and negative for all other systems.  PHYSICAL EXAM BP (!) 170/59   Pulse 63   Ht 5' 4.5" (1.638 m)   Wt 112 lb (50.8 kg)   BMI 18.93 kg/m   GENERAL:  Well appearing NECK:  No jugular venous distention, waveform within normal limits, carotid upstroke brisk and symmetric, bilateral carotid bruits, no thyromegaly LUNGS:  Clear to auscultation bilaterally CHEST:  Unremarkable HEART:  PMI not displaced or sustained,S1 and S2 within normal limits, no S3, no S4, no clicks, no rubs, 2 out of 6 apical systolic murmur radiating slightly at the aortic outflow tract and early peaking, no diastolic murmurs ABD:  Flat, positive bowel sounds normal in frequency in pitch, positive abdominal  bruits, no rebound, no guarding, no midline pulsatile mass, no hepatomegaly, no splenomegaly EXT:  2 plus pulses throughout, no edema, no cyanosis no clubbing    EKG:  Sinus rhythm, rate 63, axis within normal limits, intervals within normal limits, nonspecific anterolateral T-wave flattening.  PACs  no change from previous. 07/12/2018    ASSESSMENT AND PLAN  AORTIC VALVE REPLACEMENT, HX OF - She had mild stenosis in 2018.  Her exam has not changed.  No change in therapy or further imaging is indicated.  I did review the echo from her last visit for this  ABDOMINAL AORTIC ANEURYSM - This was 3.3 x 3 in Dec.  I reviewed these results.  No change in therapy or further imaging at this point.  DYSLIPIDEMIA - Her LDL was 75.  She will continue the meds as listed.  HYPERTENSION, MILD -  Her blood pressure is elevated as above.  However, I repeated it before she left and it was 148/74.  She says is well controlled at home.  It was well controlled in her primary care  office.  No change in therapy and she will keep an eye on it.  TOBACCO USE - She says she is still not smoking.  She quit in 2009 per her report.  CAROTID STENOSIS - This was mild in Jan 2019.  No need for repeat imaging at this point.

## 2018-07-12 ENCOUNTER — Ambulatory Visit (INDEPENDENT_AMBULATORY_CARE_PROVIDER_SITE_OTHER): Payer: Medicare HMO | Admitting: Cardiology

## 2018-07-12 ENCOUNTER — Encounter: Payer: Self-pay | Admitting: Cardiology

## 2018-07-12 VITALS — BP 170/59 | HR 63 | Ht 64.5 in | Wt 112.0 lb

## 2018-07-12 DIAGNOSIS — I739 Peripheral vascular disease, unspecified: Secondary | ICD-10-CM | POA: Diagnosis not present

## 2018-07-12 DIAGNOSIS — I714 Abdominal aortic aneurysm, without rupture, unspecified: Secondary | ICD-10-CM

## 2018-07-12 DIAGNOSIS — E785 Hyperlipidemia, unspecified: Secondary | ICD-10-CM | POA: Diagnosis not present

## 2018-07-12 DIAGNOSIS — Z952 Presence of prosthetic heart valve: Secondary | ICD-10-CM | POA: Diagnosis not present

## 2018-07-12 DIAGNOSIS — I10 Essential (primary) hypertension: Secondary | ICD-10-CM

## 2018-07-12 DIAGNOSIS — I779 Disorder of arteries and arterioles, unspecified: Secondary | ICD-10-CM

## 2018-07-12 NOTE — Patient Instructions (Signed)
Medication Instructions:  Continue current medications  If you need a refill on your cardiac medications before your next appointment, please call your pharmacy.  Labwork: None Ordered   Take the provided lab slips with you to the lab for your blood draw.   When you have your labs (blood work) drawn today and your tests are completely normal, you will receive your results only by MyChart Message (if you have MyChart) -OR-  A paper copy in the mail.  If you have any lab test that is abnormal or we need to change your treatment, we will call you to review these results.  Testing/Procedures: None Ordered  Follow-Up: You will need a follow up appointment in 12 months.  Please call our office 2 months in advance to schedule this appointment.  You may see Minus Breeding, MD or one of the following Advanced Practice Providers on your designated Care Team:   Rosaria Ferries, PA-C . Jory Sims, DNP, ANP   At [facility], you and your health needs are our priority.  As part of our continuing mission to provide you with exceptional heart care, we have created designated Provider Care Teams.  These Care Teams include your primary Cardiologist (physician) and Advanced Practice Providers (APPs -  Physician Assistants and Nurse Practitioners) who all work together to provide you with the care you need, when you need it.  Thank you for choosing [facility]!!

## 2018-07-17 ENCOUNTER — Other Ambulatory Visit (HOSPITAL_COMMUNITY): Payer: Self-pay | Admitting: *Deleted

## 2018-07-18 ENCOUNTER — Ambulatory Visit (HOSPITAL_COMMUNITY)
Admission: RE | Admit: 2018-07-18 | Discharge: 2018-07-18 | Disposition: A | Discharge status: Home / Self Care | Payer: Medicare HMO | Source: Ambulatory Visit | Attending: Internal Medicine | Admitting: Internal Medicine

## 2018-07-18 DIAGNOSIS — M810 Age-related osteoporosis without current pathological fracture: Secondary | ICD-10-CM | POA: Insufficient documentation

## 2018-07-18 MED ORDER — ZOLEDRONIC ACID 5 MG/100ML IV SOLN
INTRAVENOUS | Status: AC
  Filled 2018-07-18: qty 100

## 2018-07-18 MED ORDER — ZOLEDRONIC ACID 5 MG/100ML IV SOLN
5.0000 mg | Freq: Once | INTRAVENOUS | Status: AC
  Administered 2018-07-18: 5 mg via INTRAVENOUS

## 2018-08-06 DIAGNOSIS — Z681 Body mass index (BMI) 19 or less, adult: Secondary | ICD-10-CM | POA: Diagnosis not present

## 2018-08-06 DIAGNOSIS — I10 Essential (primary) hypertension: Secondary | ICD-10-CM | POA: Diagnosis not present

## 2018-08-06 DIAGNOSIS — R112 Nausea with vomiting, unspecified: Secondary | ICD-10-CM | POA: Diagnosis not present

## 2018-08-06 DIAGNOSIS — H6690 Otitis media, unspecified, unspecified ear: Secondary | ICD-10-CM | POA: Diagnosis not present

## 2018-08-08 ENCOUNTER — Ambulatory Visit: Payer: Medicare HMO

## 2018-08-08 ENCOUNTER — Other Ambulatory Visit: Payer: Self-pay

## 2018-08-08 ENCOUNTER — Inpatient Hospital Stay (HOSPITAL_COMMUNITY)
Admission: EM | Admit: 2018-08-08 | Discharge: 2018-08-31 | DRG: 683 | Disposition: A | Discharge status: Skilled Nursing Facility | Payer: Medicare HMO | Attending: Internal Medicine | Admitting: Internal Medicine

## 2018-08-08 ENCOUNTER — Observation Stay (HOSPITAL_COMMUNITY): Payer: Medicare HMO

## 2018-08-08 ENCOUNTER — Encounter (HOSPITAL_COMMUNITY): Payer: Self-pay | Admitting: Emergency Medicine

## 2018-08-08 DIAGNOSIS — Q6101 Congenital single renal cyst: Secondary | ICD-10-CM

## 2018-08-08 DIAGNOSIS — I129 Hypertensive chronic kidney disease with stage 1 through stage 4 chronic kidney disease, or unspecified chronic kidney disease: Secondary | ICD-10-CM | POA: Diagnosis not present

## 2018-08-08 DIAGNOSIS — I480 Paroxysmal atrial fibrillation: Secondary | ICD-10-CM | POA: Diagnosis not present

## 2018-08-08 DIAGNOSIS — N179 Acute kidney failure, unspecified: Secondary | ICD-10-CM | POA: Diagnosis present

## 2018-08-08 DIAGNOSIS — D631 Anemia in chronic kidney disease: Secondary | ICD-10-CM | POA: Diagnosis not present

## 2018-08-08 DIAGNOSIS — D509 Iron deficiency anemia, unspecified: Secondary | ICD-10-CM | POA: Diagnosis not present

## 2018-08-08 DIAGNOSIS — R7989 Other specified abnormal findings of blood chemistry: Secondary | ICD-10-CM | POA: Diagnosis not present

## 2018-08-08 DIAGNOSIS — E875 Hyperkalemia: Secondary | ICD-10-CM | POA: Diagnosis present

## 2018-08-08 DIAGNOSIS — R1111 Vomiting without nausea: Secondary | ICD-10-CM | POA: Diagnosis not present

## 2018-08-08 DIAGNOSIS — K8020 Calculus of gallbladder without cholecystitis without obstruction: Secondary | ICD-10-CM | POA: Diagnosis present

## 2018-08-08 DIAGNOSIS — N19 Unspecified kidney failure: Secondary | ICD-10-CM

## 2018-08-08 DIAGNOSIS — Z681 Body mass index (BMI) 19 or less, adult: Secondary | ICD-10-CM

## 2018-08-08 DIAGNOSIS — I4891 Unspecified atrial fibrillation: Secondary | ICD-10-CM | POA: Diagnosis present

## 2018-08-08 DIAGNOSIS — E1159 Type 2 diabetes mellitus with other circulatory complications: Secondary | ICD-10-CM | POA: Diagnosis not present

## 2018-08-08 DIAGNOSIS — Z7984 Long term (current) use of oral hypoglycemic drugs: Secondary | ICD-10-CM

## 2018-08-08 DIAGNOSIS — I6529 Occlusion and stenosis of unspecified carotid artery: Secondary | ICD-10-CM | POA: Diagnosis not present

## 2018-08-08 DIAGNOSIS — F05 Delirium due to known physiological condition: Secondary | ICD-10-CM | POA: Diagnosis not present

## 2018-08-08 DIAGNOSIS — I10 Essential (primary) hypertension: Secondary | ICD-10-CM

## 2018-08-08 DIAGNOSIS — I714 Abdominal aortic aneurysm, without rupture: Secondary | ICD-10-CM | POA: Diagnosis present

## 2018-08-08 DIAGNOSIS — Z882 Allergy status to sulfonamides status: Secondary | ICD-10-CM

## 2018-08-08 DIAGNOSIS — H6690 Otitis media, unspecified, unspecified ear: Secondary | ICD-10-CM | POA: Diagnosis not present

## 2018-08-08 DIAGNOSIS — N183 Chronic kidney disease, stage 3 unspecified: Secondary | ICD-10-CM | POA: Diagnosis present

## 2018-08-08 DIAGNOSIS — K5900 Constipation, unspecified: Secondary | ICD-10-CM | POA: Diagnosis not present

## 2018-08-08 DIAGNOSIS — R600 Localized edema: Secondary | ICD-10-CM | POA: Diagnosis not present

## 2018-08-08 DIAGNOSIS — Z885 Allergy status to narcotic agent status: Secondary | ICD-10-CM

## 2018-08-08 DIAGNOSIS — R32 Unspecified urinary incontinence: Secondary | ICD-10-CM | POA: Diagnosis not present

## 2018-08-08 DIAGNOSIS — D649 Anemia, unspecified: Secondary | ICD-10-CM | POA: Diagnosis not present

## 2018-08-08 DIAGNOSIS — E119 Type 2 diabetes mellitus without complications: Secondary | ICD-10-CM

## 2018-08-08 DIAGNOSIS — E1151 Type 2 diabetes mellitus with diabetic peripheral angiopathy without gangrene: Secondary | ICD-10-CM | POA: Diagnosis present

## 2018-08-08 DIAGNOSIS — Z952 Presence of prosthetic heart valve: Secondary | ICD-10-CM

## 2018-08-08 DIAGNOSIS — Z515 Encounter for palliative care: Secondary | ICD-10-CM

## 2018-08-08 DIAGNOSIS — Z79899 Other long term (current) drug therapy: Secondary | ICD-10-CM

## 2018-08-08 DIAGNOSIS — R5381 Other malaise: Secondary | ICD-10-CM | POA: Diagnosis not present

## 2018-08-08 DIAGNOSIS — E538 Deficiency of other specified B group vitamins: Secondary | ICD-10-CM | POA: Diagnosis not present

## 2018-08-08 DIAGNOSIS — E1122 Type 2 diabetes mellitus with diabetic chronic kidney disease: Secondary | ICD-10-CM | POA: Diagnosis present

## 2018-08-08 DIAGNOSIS — E8729 Other acidosis: Secondary | ICD-10-CM | POA: Diagnosis present

## 2018-08-08 DIAGNOSIS — R110 Nausea: Secondary | ICD-10-CM | POA: Diagnosis not present

## 2018-08-08 DIAGNOSIS — E872 Acidosis: Secondary | ICD-10-CM | POA: Diagnosis not present

## 2018-08-08 DIAGNOSIS — N189 Chronic kidney disease, unspecified: Secondary | ICD-10-CM

## 2018-08-08 DIAGNOSIS — R627 Adult failure to thrive: Secondary | ICD-10-CM

## 2018-08-08 DIAGNOSIS — R7889 Finding of other specified substances, not normally found in blood: Secondary | ICD-10-CM | POA: Diagnosis not present

## 2018-08-08 DIAGNOSIS — R112 Nausea with vomiting, unspecified: Secondary | ICD-10-CM | POA: Diagnosis not present

## 2018-08-08 DIAGNOSIS — E785 Hyperlipidemia, unspecified: Secondary | ICD-10-CM | POA: Diagnosis present

## 2018-08-08 DIAGNOSIS — Z7982 Long term (current) use of aspirin: Secondary | ICD-10-CM

## 2018-08-08 DIAGNOSIS — Z7951 Long term (current) use of inhaled steroids: Secondary | ICD-10-CM

## 2018-08-08 DIAGNOSIS — N170 Acute kidney failure with tubular necrosis: Principal | ICD-10-CM | POA: Diagnosis present

## 2018-08-08 DIAGNOSIS — R3129 Other microscopic hematuria: Secondary | ICD-10-CM | POA: Diagnosis present

## 2018-08-08 DIAGNOSIS — R531 Weakness: Secondary | ICD-10-CM | POA: Diagnosis not present

## 2018-08-08 DIAGNOSIS — E860 Dehydration: Secondary | ICD-10-CM | POA: Diagnosis present

## 2018-08-08 DIAGNOSIS — Z66 Do not resuscitate: Secondary | ICD-10-CM | POA: Diagnosis present

## 2018-08-08 DIAGNOSIS — F17200 Nicotine dependence, unspecified, uncomplicated: Secondary | ICD-10-CM | POA: Diagnosis present

## 2018-08-08 DIAGNOSIS — N2581 Secondary hyperparathyroidism of renal origin: Secondary | ICD-10-CM | POA: Diagnosis not present

## 2018-08-08 DIAGNOSIS — Z880 Allergy status to penicillin: Secondary | ICD-10-CM

## 2018-08-08 DIAGNOSIS — Z90710 Acquired absence of both cervix and uterus: Secondary | ICD-10-CM

## 2018-08-08 DIAGNOSIS — Z751 Person awaiting admission to adequate facility elsewhere: Secondary | ICD-10-CM

## 2018-08-08 LAB — BASIC METABOLIC PANEL
Anion gap: 17 — ABNORMAL HIGH (ref 5–15)
BUN: 99 mg/dL — ABNORMAL HIGH (ref 8–23)
CO2: 11 mmol/L — ABNORMAL LOW (ref 22–32)
Calcium: 7.3 mg/dL — ABNORMAL LOW (ref 8.9–10.3)
Chloride: 108 mmol/L (ref 98–111)
Creatinine, Ser: 12.67 mg/dL — ABNORMAL HIGH (ref 0.44–1.00)
GFR calc Af Amer: 3 mL/min — ABNORMAL LOW (ref >60)
GFR calc non Af Amer: 3 mL/min — ABNORMAL LOW (ref >60)
Glucose, Bld: 105 mg/dL — ABNORMAL HIGH (ref 70–99)
Potassium: 6.6 mmol/L (ref 3.5–5.1)
Sodium: 136 mmol/L (ref 135–145)

## 2018-08-08 LAB — URINALYSIS, ROUTINE W REFLEX MICROSCOPIC
Bilirubin Urine: NEGATIVE
Glucose, UA: 50 mg/dL — AB
Ketones, ur: NEGATIVE mg/dL
Leukocytes,Ua: NEGATIVE
Nitrite: NEGATIVE
Protein, ur: 100 mg/dL — AB
Specific Gravity, Urine: 1.011 (ref 1.005–1.030)
pH: 5 (ref 5.0–8.0)

## 2018-08-08 LAB — CBC WITH DIFFERENTIAL/PLATELET
Abs Immature Granulocytes: 0.45 10*3/uL — ABNORMAL HIGH (ref 0.00–0.07)
Basophils Absolute: 0.1 10*3/uL (ref 0.0–0.1)
Basophils Relative: 1 %
Eosinophils Absolute: 0.1 10*3/uL (ref 0.0–0.5)
Eosinophils Relative: 1 %
HCT: 29.9 % — ABNORMAL LOW (ref 36.0–46.0)
Hemoglobin: 9.2 g/dL — ABNORMAL LOW (ref 12.0–15.0)
Immature Granulocytes: 5 %
Lymphocytes Relative: 13 %
Lymphs Abs: 1.2 10*3/uL (ref 0.7–4.0)
MCH: 28.8 pg (ref 26.0–34.0)
MCHC: 30.8 g/dL (ref 30.0–36.0)
MCV: 93.7 fL (ref 80.0–100.0)
Monocytes Absolute: 0.8 10*3/uL (ref 0.1–1.0)
Monocytes Relative: 9 %
Neutro Abs: 6.2 10*3/uL (ref 1.7–7.7)
Neutrophils Relative %: 71 %
Platelets: 166 10*3/uL (ref 150–400)
RBC: 3.19 MIL/uL — ABNORMAL LOW (ref 3.87–5.11)
RDW: 14.9 % (ref 11.5–15.5)
WBC: 8.7 10*3/uL (ref 4.0–10.5)
nRBC: 0 % (ref 0.0–0.2)

## 2018-08-08 LAB — HEPATIC FUNCTION PANEL
ALT: 6 U/L (ref 0–44)
AST: 11 U/L — ABNORMAL LOW (ref 15–41)
Albumin: 3.3 g/dL — ABNORMAL LOW (ref 3.5–5.0)
Alkaline Phosphatase: 59 U/L (ref 38–126)
Bilirubin, Direct: <0.1 mg/dL (ref 0.0–0.2)
Total Bilirubin: 0.4 mg/dL (ref 0.3–1.2)
Total Protein: 6.3 g/dL — ABNORMAL LOW (ref 6.5–8.1)

## 2018-08-08 LAB — GLUCOSE, CAPILLARY
Glucose-Capillary: 186 mg/dL — ABNORMAL HIGH (ref 70–99)
Glucose-Capillary: 243 mg/dL — ABNORMAL HIGH (ref 70–99)

## 2018-08-08 IMAGING — US US RENAL
1 series · 14 of 25 positions shown · non-contrast
Comparison: None.

CLINICAL DATA: Renal failure

EXAM:
RENAL / URINARY TRACT ULTRASOUND COMPLETE

[Series 1: us renal · 14 of 34 slices shown]
[im 1/34]
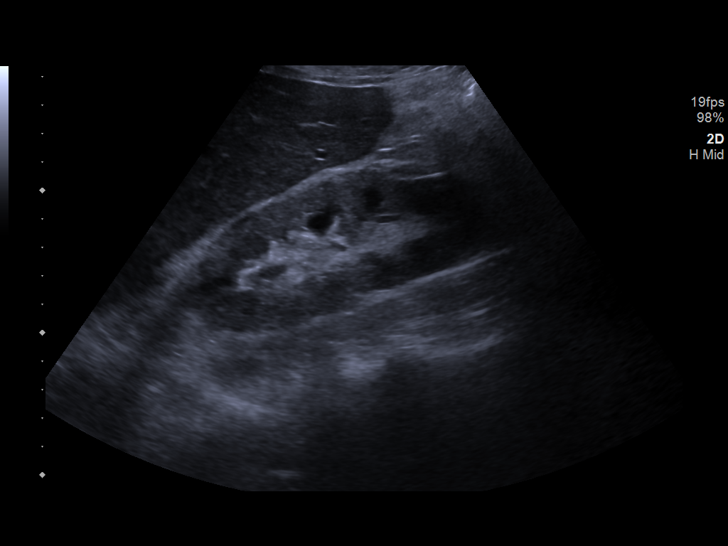
[im 3/34]
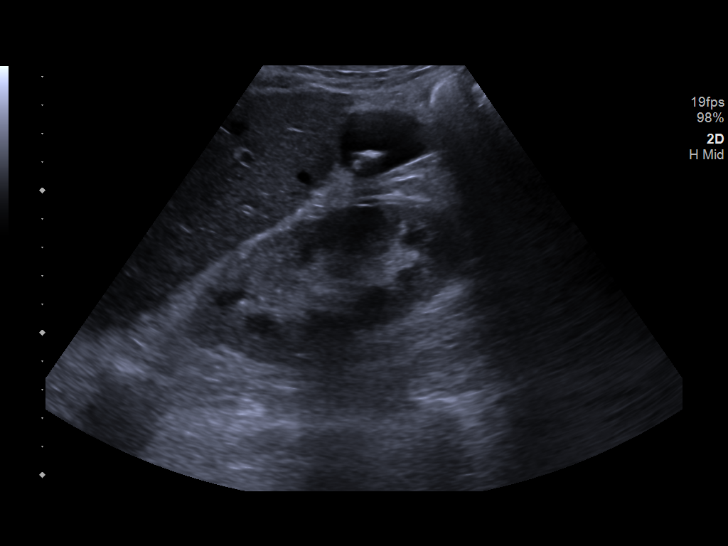
[im 6/34]
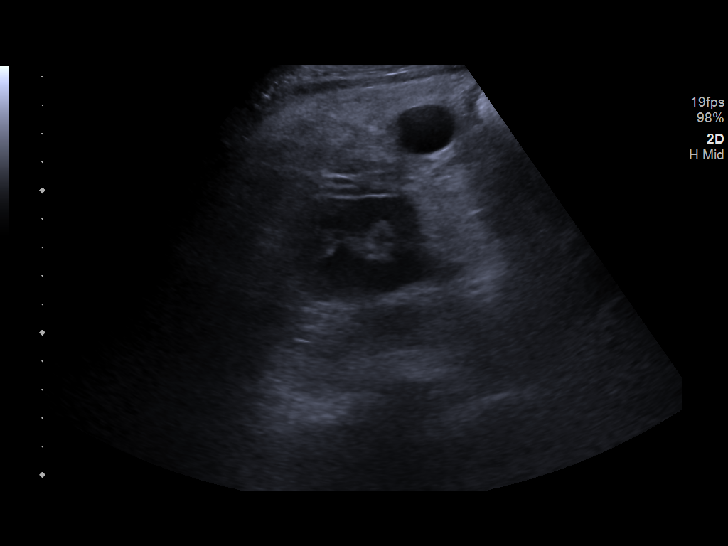
[im 9/34]
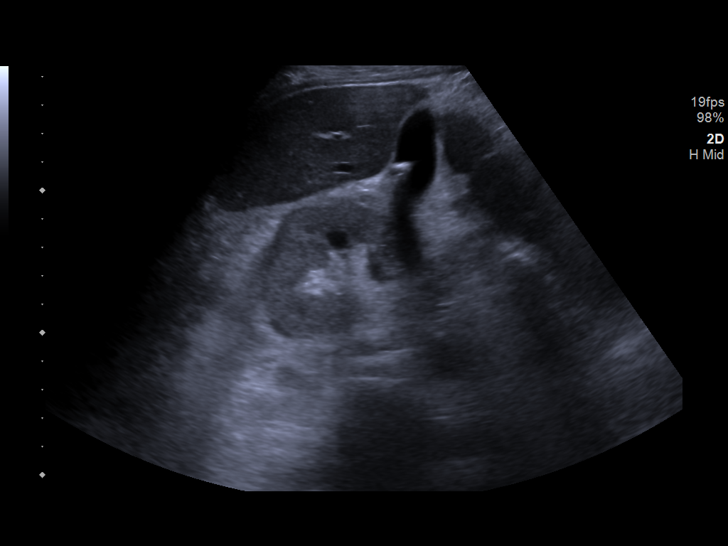
[im 12/34]
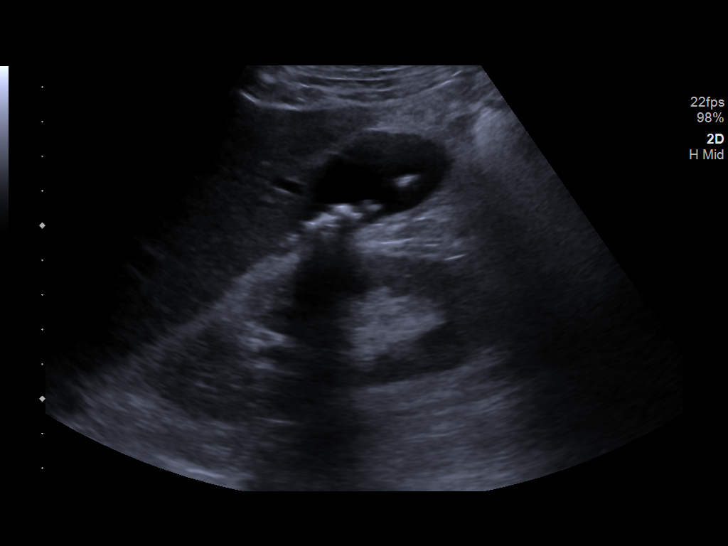
[im 13/34]
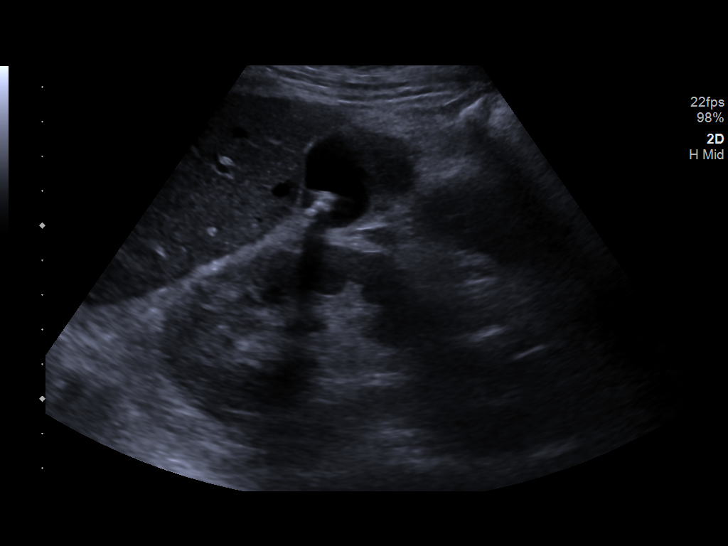
[im 16/34]
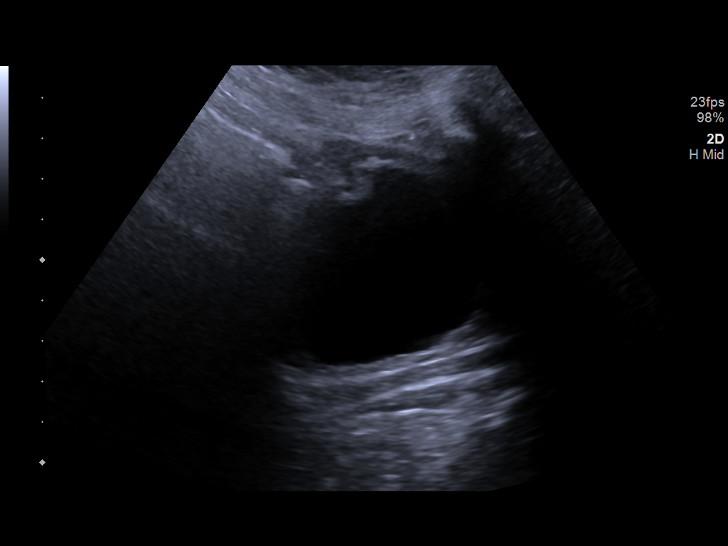
[im 18/34]
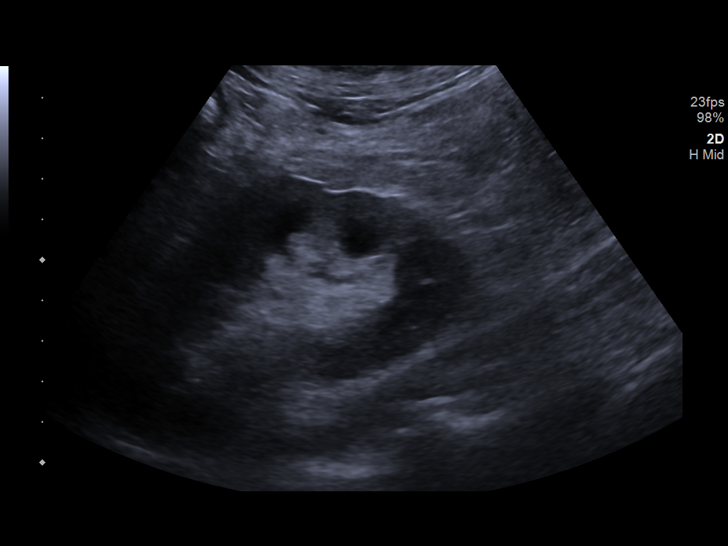
[im 21/34]
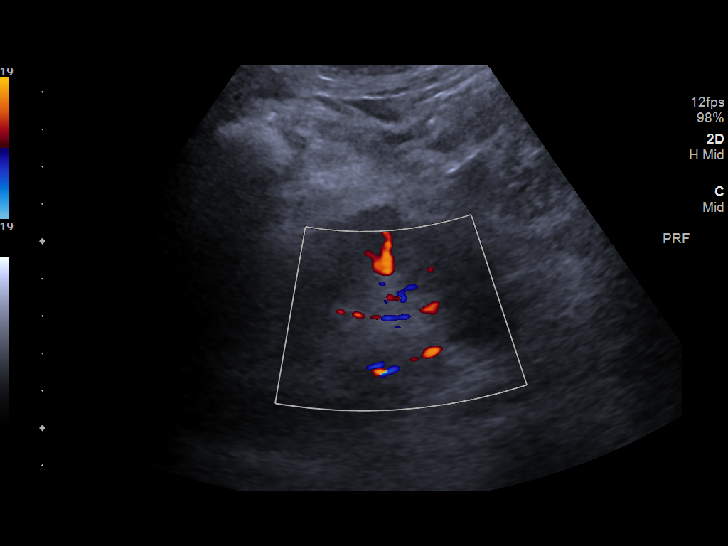
[im 23/34]
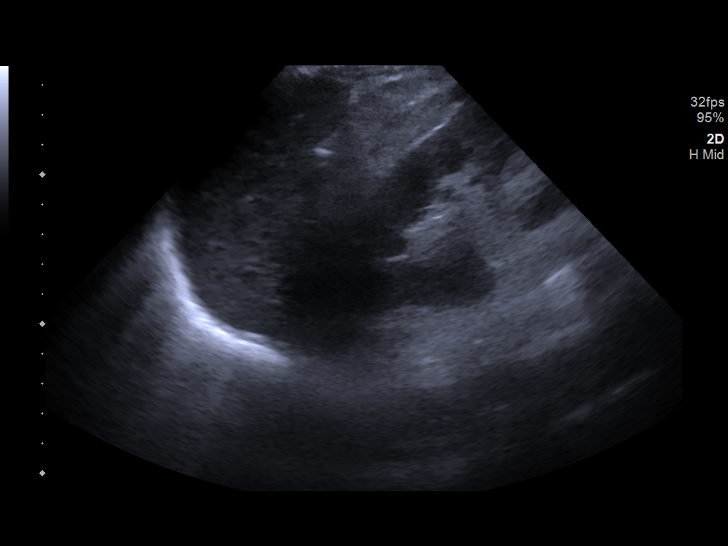
[im 25/34]
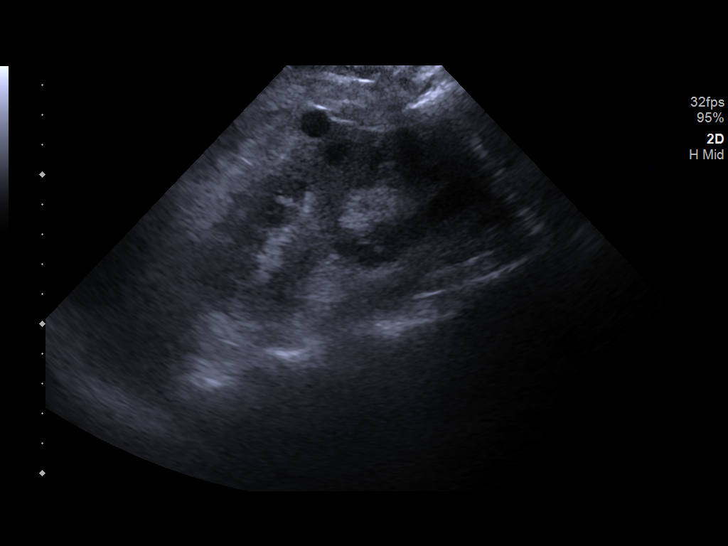
[im 28/34]
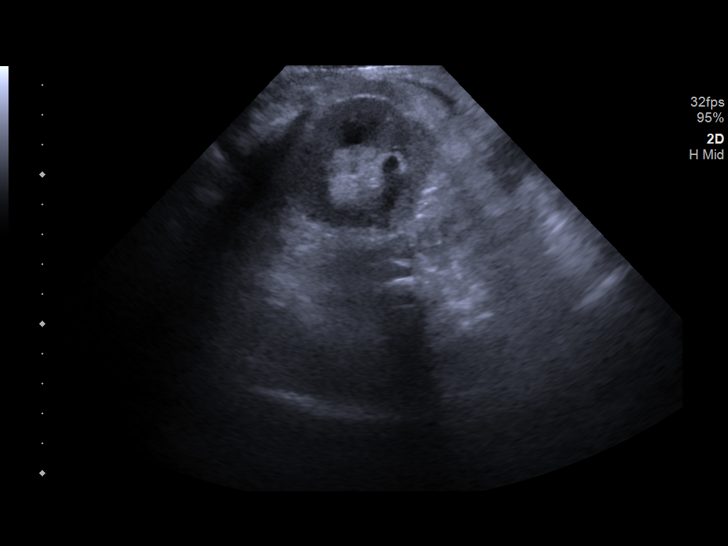
[im 31/34]
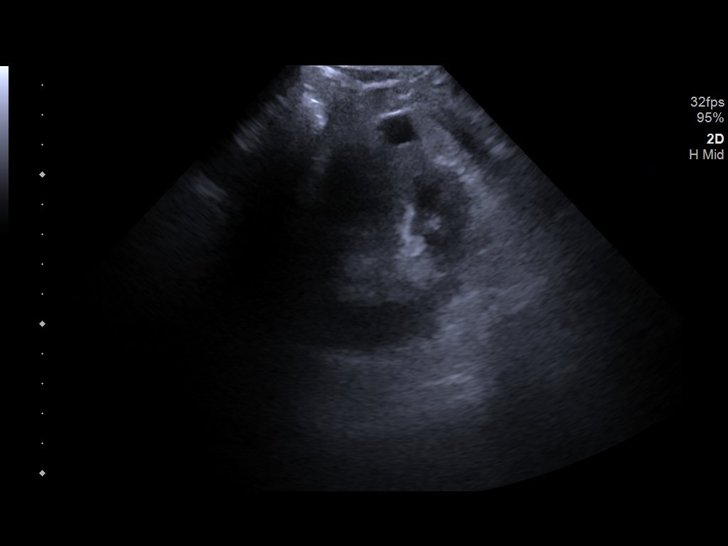
[im 34/34]
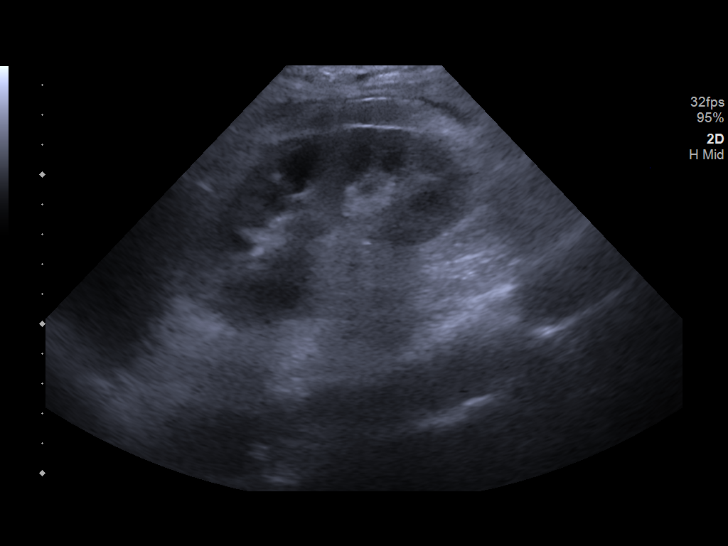

[14 of 25 positions shown; findings below may reference images not displayed]

FINDINGS: Right Kidney:

Renal measurements: 10.9 x 4.7 x 5.3 cm = volume: 145 mL. Diffusely
increased parenchymal echotexture suggesting chronic medical renal
disease. No hydronephrosis.

Left Kidney:

Renal measurements: 10.8 x 4.9 x 4.5 cm = volume: 127 mL. Small
simple appearing cyst in the midpole measuring 1.2 cm maximal
diameter. Diffusely increased parenchymal echotexture suggesting
chronic medical disease. No hydronephrosis.

Bladder:

Appears normal for degree of bladder distention.

Incidental note of cholelithiasis.
IMPRESSION: 1. Increased parenchymal echotexture in the kidney suggesting
chronic medical renal disease. No hydronephrosis.
2. Small benign-appearing cyst on the left kidney.
3. Incidental note of cholelithiasis.

## 2018-08-08 MED ORDER — ONDANSETRON HCL 4 MG/2ML IJ SOLN
4.0000 mg | Freq: Four times a day (QID) | INTRAMUSCULAR | Status: DC | PRN
  Administered 2018-08-13: 4 mg via INTRAVENOUS
  Filled 2018-08-08: qty 2

## 2018-08-08 MED ORDER — ACETAMINOPHEN 325 MG PO TABS
650.0000 mg | ORAL_TABLET | Freq: Four times a day (QID) | ORAL | Status: DC | PRN
  Administered 2018-08-12 – 2018-08-14 (×2): 650 mg via ORAL
  Filled 2018-08-08 (×2): qty 2

## 2018-08-08 MED ORDER — SODIUM BICARBONATE 650 MG PO TABS
650.0000 mg | ORAL_TABLET | Freq: Three times a day (TID) | ORAL | Status: DC
  Administered 2018-08-09: 650 mg via ORAL
  Filled 2018-08-08: qty 1

## 2018-08-08 MED ORDER — AMLODIPINE BESYLATE 5 MG PO TABS
5.0000 mg | ORAL_TABLET | Freq: Every day | ORAL | Status: DC
  Administered 2018-08-09: 5 mg via ORAL
  Filled 2018-08-08: qty 1

## 2018-08-08 MED ORDER — ACETAMINOPHEN 650 MG RE SUPP: 650.0000 mg | Freq: Four times a day (QID) | RECTAL | Status: DC | PRN

## 2018-08-08 MED ORDER — SODIUM CHLORIDE 0.9 % IV SOLN
1.0000 g | Freq: Once | INTRAVENOUS | Status: AC
  Administered 2018-08-08: 1 g via INTRAVENOUS
  Filled 2018-08-08: qty 10

## 2018-08-08 MED ORDER — PRAVASTATIN SODIUM 10 MG PO TABS
10.0000 mg | ORAL_TABLET | Freq: Every day | ORAL | Status: DC
  Administered 2018-08-09 – 2018-08-30 (×22): 10 mg via ORAL
  Filled 2018-08-08 (×22): qty 1

## 2018-08-08 MED ORDER — ONDANSETRON HCL 4 MG PO TABS
4.0000 mg | ORAL_TABLET | Freq: Four times a day (QID) | ORAL | Status: DC | PRN
  Administered 2018-08-09 (×2): 4 mg via ORAL
  Filled 2018-08-08 (×2): qty 1

## 2018-08-08 MED ORDER — SODIUM CHLORIDE 0.9% FLUSH
3.0000 mL | Freq: Two times a day (BID) | INTRAVENOUS | Status: DC
  Administered 2018-08-09 – 2018-08-31 (×32): 3 mL via INTRAVENOUS

## 2018-08-08 MED ORDER — DEXTROSE 50 % IV SOLN
1.0000 | Freq: Once | INTRAVENOUS | Status: AC
  Administered 2018-08-08: 50 mL via INTRAVENOUS
  Filled 2018-08-08: qty 50

## 2018-08-08 MED ORDER — SODIUM BICARBONATE 8.4 % IV SOLN
50.0000 meq | Freq: Once | INTRAVENOUS | Status: AC
  Administered 2018-08-08: 50 meq via INTRAVENOUS
  Filled 2018-08-08: qty 50

## 2018-08-08 MED ORDER — INSULIN ASPART 100 UNIT/ML ~~LOC~~ SOLN
0.0000 [IU] | Freq: Every day | SUBCUTANEOUS | Status: DC
  Administered 2018-08-25 – 2018-08-27 (×2): 2 [IU] via SUBCUTANEOUS

## 2018-08-08 MED ORDER — INSULIN ASPART 100 UNIT/ML ~~LOC~~ SOLN
0.0000 [IU] | Freq: Three times a day (TID) | SUBCUTANEOUS | Status: DC
  Administered 2018-08-10 – 2018-08-13 (×3): 2 [IU] via SUBCUTANEOUS
  Administered 2018-08-14 – 2018-08-18 (×6): 1 [IU] via SUBCUTANEOUS
  Administered 2018-08-20: 2 [IU] via SUBCUTANEOUS
  Administered 2018-08-21 – 2018-08-22 (×4): 1 [IU] via SUBCUTANEOUS
  Administered 2018-08-23 – 2018-08-24 (×2): 2 [IU] via SUBCUTANEOUS
  Administered 2018-08-24: 1 [IU] via SUBCUTANEOUS
  Administered 2018-08-25: 5 [IU] via SUBCUTANEOUS
  Administered 2018-08-25: 1 [IU] via SUBCUTANEOUS
  Administered 2018-08-26: 3 [IU] via SUBCUTANEOUS
  Administered 2018-08-27 (×2): 1 [IU] via SUBCUTANEOUS
  Administered 2018-08-27: 5 [IU] via SUBCUTANEOUS
  Administered 2018-08-28: 3 [IU] via SUBCUTANEOUS
  Administered 2018-08-28: 2 [IU] via SUBCUTANEOUS
  Administered 2018-08-29 (×2): 1 [IU] via SUBCUTANEOUS
  Administered 2018-08-29 – 2018-08-30 (×2): 2 [IU] via SUBCUTANEOUS
  Administered 2018-08-30: 1 [IU] via SUBCUTANEOUS
  Administered 2018-08-30: 2 [IU] via SUBCUTANEOUS
  Administered 2018-08-31 (×2): 1 [IU] via SUBCUTANEOUS

## 2018-08-08 MED ORDER — INSULIN ASPART 100 UNIT/ML IV SOLN
5.0000 [IU] | Freq: Once | INTRAVENOUS | Status: AC
  Administered 2018-08-08: 5 [IU] via INTRAVENOUS

## 2018-08-08 MED ORDER — SODIUM CHLORIDE 0.9 % IV SOLN
INTRAVENOUS | Status: AC
  Administered 2018-08-08: 23:00:00 via INTRAVENOUS

## 2018-08-08 MED ORDER — HEPARIN SODIUM (PORCINE) 5000 UNIT/ML IJ SOLN
5000.0000 [IU] | Freq: Three times a day (TID) | INTRAMUSCULAR | Status: DC
  Administered 2018-08-08 – 2018-08-31 (×62): 5000 [IU] via SUBCUTANEOUS
  Filled 2018-08-08 (×60): qty 1

## 2018-08-08 MED ORDER — ASPIRIN EC 81 MG PO TBEC
81.0000 mg | DELAYED_RELEASE_TABLET | Freq: Every day | ORAL | Status: DC
  Administered 2018-08-09 – 2018-08-31 (×23): 81 mg via ORAL
  Filled 2018-08-08 (×24): qty 1

## 2018-08-08 NOTE — H&P (Signed)
History and Physical    [name]:[id number] DOB: [birth year] DOA: 08/08/2018  PCP: Prince Solian, MD   Patient coming from: Home   Chief Complaint: Fatigue, decreased UOP, abnormal outpatient labs   HPI: Sandra Sheppard is a 73 y.o. female with medical history significant for hypertension, hyperlipidemia, type 2 diabetes mellitus, and chronic kidney disease stage III, now presenting to the emergency department for evaluation of abnormal outpatient blood work.  Patient reports that she developed an earache last week, was seen by her PCP, states that she was diagnosed with an infection and started on Levaquin.  On 08/04/2018, she developed nausea with nonbloody vomiting and watery diarrhea.  The nausea, vomiting, and diarrhea have begun to improve with last episode of diarrhea this morning.  There is no blood in her vomit or diarrhea and she denies any abdominal pain or fevers.  She denies any flank pain.  She has not noticed any change in her urination.  She denies any chest pain or palpitations.  Other than the antibiotic, she denies starting any new medications. The ear pain has resolved.   ED Course: Upon arrival to the ED, patient is found to be afebrile, saturating well on room air, and vitals otherwise normal.  EKG features a sinus rhythm.  Chemistry panel is concerning for potassium of 6.6, bicarbonate 11, anion gap 17, BUN 99, 167, up from 1.6 on the most recent prior labs from 2018.  CBC is notable for normocytic anemia with hemoglobin 9.2, down from 10.8 two years ago.  Urinalysis with microscopic hematuria and proteinuria.  Patient was given a gram of calcium gluconate in the ED.  Nephrology was consulted by the ED physician and recommended medical admission.  Review of Systems:  All other systems reviewed and apart from HPI, are negative.  Past Medical History:  Diagnosis Date  . ABDOMINAL AORTIC ANEURYSM   . CAROTID STENOSIS   . FIBRILLATION, ATRIAL   . HYPERCHOLESTEROLEMIA   IIA   . HYPERTENSION, MILD   . MURMUR   . PERIPHERAL VASCULAR DISEASE   . STENOSIS, MITRAL AND AORTIC VALVES     Past Surgical History:  Procedure Laterality Date  . AORTIC VALVE REPLACEMENT    . TOTAL ABDOMINAL HYSTERECTOMY       reports that she has quit smoking. She has never used smokeless tobacco. She reports that she does not drink alcohol. No history on file for drug.  Allergies  Allergen Reactions  . Codeine     Rash, Nausea and Vomiting   . Sulfonamide Derivatives   . Penicillins Rash    Has patient had a PCN reaction causing immediate rash, facial/tongue/throat swelling, SOB or lightheadedness with hypotension: YES Has patient had a PCN reaction causing severe rash involving mucus membranes or skin necrosis:NO Has patient had a PCN reaction that required hospitalization NO Has patient had a PCN reaction occurring within the last 10 years: NO If all of the above answers are "NO", then may proceed with Cephalosporin use.    Family History  Problem Relation Age of Onset  . Colon polyps Other      Prior to Admission medications   Medication Sig Start Date End Date Taking? Authorizing Provider  amLODipine (NORVASC) 5 MG tablet Take 5 mg by mouth daily.    [provider]  aspirin 81 MG tablet Take 81 mg by mouth daily.      [provider]  Cholecalciferol (VITAMIN D-3) 1000 UNITS CAPS Take 1,000 Units by mouth  daily.    [provider]  fluticasone (FLONASE) 50 MCG/ACT nasal spray Place 1-2 sprays into both nostrils as needed. 07/06/18   [provider]  lisinopril-hydrochlorothiazide (PRINZIDE,ZESTORETIC) 20-25 MG per tablet Take 1 tablet by mouth daily. 06/11/13   Minus Breeding, MD  metFORMIN (GLUCOPHAGE-XR) 500 MG 24 hr tablet Take 500 mg by mouth 2 (two) times daily. 04/22/15   [provider]  Multiple Vitamin (MULTIVITAMIN) capsule Take 1 capsule by mouth daily.      [provider]  pravastatin (PRAVACHOL)  40 MG tablet Take 40 mg by mouth daily.      [provider]    Physical Exam: Vitals:   08/08/18 1745 08/08/18 1800 08/08/18 1834 08/08/18 1900  BP: (!) 149/51 (!) 146/56 136/60 (!) 140/52  Pulse: 66 72 75 74  Resp: 17 17 18  (!) 21  Temp:      TempSrc:      SpO2: 100% 100% 100% 99%    Constitutional: NAD, calm  Eyes: PERTLA, lids and conjunctivae normal ENMT: Mucous membranes are moist. Posterior pharynx clear of any exudate or lesions.   Neck: normal, supple, no masses, no thyromegaly Respiratory: clear to auscultation bilaterally, no wheezing, no crackles. Normal respiratory effort.   Cardiovascular: S1 & S2 heard, regular rate and rhythm. No extremity edema.   Abdomen: No distension, no tenderness, soft. Bowel sounds normal.  Musculoskeletal: no clubbing / cyanosis. No joint deformity upper and lower extremities.    Skin: no significant rashes, lesions, ulcers. Poor turgor. Neurologic: CN 2-12 grossly intact. Sensation intact. Strength 5/5 in all 4 limbs.  Psychiatric: Alert and oriented to person, place, and situation. Calm, cooperative.    Labs on Admission: I have personally reviewed following labs and imaging studies  CBC: Recent Labs  Lab 08/08/18 1753  WBC 8.7  NEUTROABS 6.2  HGB 9.2*  HCT 29.9*  MCV 93.7  PLT 322   Basic Metabolic Panel: Recent Labs  Lab 08/08/18 1753  NA 136  K 6.6*  CL 108  CO2 11*  GLUCOSE 105*  BUN 99*  CREATININE 12.67*  CALCIUM 7.3*   GFR: CrCl cannot be calculated (Unknown ideal weight.). Liver Function Tests: Recent Labs  Lab 08/08/18 1753  AST 11*  ALT 6  ALKPHOS 59  BILITOT 0.4  PROT 6.3*  ALBUMIN 3.3*   No results for input(s): LIPASE, AMYLASE in the last 168 hours. No results for input(s): AMMONIA in the last 168 hours. Coagulation Profile: No results for input(s): INR, PROTIME in the last 168 hours. Cardiac Enzymes: No results for input(s): CKTOTAL, CKMB, CKMBINDEX, TROPONINI in the last 168  hours. BNP (last 3 results) No results for input(s): PROBNP in the last 8760 hours. HbA1C: No results for input(s): HGBA1C in the last 72 hours. CBG: No results for input(s): GLUCAP in the last 168 hours. Lipid Profile: No results for input(s): CHOL, HDL, LDLCALC, TRIG, CHOLHDL, LDLDIRECT in the last 72 hours. Thyroid Function Tests: No results for input(s): TSH, T4TOTAL, FREET4, T3FREE, THYROIDAB in the last 72 hours. Anemia Panel: No results for input(s): VITAMINB12, FOLATE, FERRITIN, TIBC, IRON, RETICCTPCT in the last 72 hours. Urine analysis:    Component Value Date/Time   COLORURINE YELLOW 08/08/2018 1812   APPEARANCEUR CLEAR 08/08/2018 1812   LABSPEC 1.011 08/08/2018 1812   PHURINE 5.0 08/08/2018 1812   GLUCOSEU 50 (A) 08/08/2018 1812   HGBUR MODERATE (A) 08/08/2018 1812   BILIRUBINUR NEGATIVE 08/08/2018 1812   KETONESUR NEGATIVE 08/08/2018 1812   PROTEINUR  100 (A) 08/08/2018 1812   UROBILINOGEN 0.2 01/14/2010 1308   NITRITE NEGATIVE 08/08/2018 1812   LEUKOCYTESUR NEGATIVE 08/08/2018 1812   Sepsis Labs: @LABRCNTIP (procalcitonin:4,lacticidven:4) )No results found for this or any previous visit (from the past 240 hour(s)).   Radiological Exams on Admission: No results found.  EKG: Independently reviewed. Sinus rhythm.   Assessment/Plan   1. Renal failure; hyperkalemia; metabolic acidosis  - Presents for evaluation of renal failure on outpatient labs, reports N/V/D for the past 5 days  - BUN is 99 and SCr 12.67, up from 16 and 1.6 in 2018  - There is associated acidosis with elevated AG and hyperkalemia with initial potassium 6.6  - Possibly prerenal azotemia in setting of N/V/D, but may have been developing more gradually as most recent labs are from 2 yrs ago  - She was treated with a gram of calcium in ED and nephrology was consulted  - Start IVF with NS at 125 cc/hr, start bicarbonate therapy; treat hyperkalemia with bicarb, insulin/dextrose; follow serial  potassium levels and continue cardiac monitoring; start ARF eval with renal US and urine chemistries   2. Hypertension  - BP at goal  - Continue Norvasc, hold lisinopril-HCTZ in light of new renal failure   3. Anemia  - Hgb is 9.2 on admission, down from 10.8 two years ago  - There is no bleeding, will monitor    4. Type II DM - No recent A1c on file  - Managed with metformin at home, held on admission  - Check CBG's and use low-intensity SSI with Novolog while in hospital    DVT prophylaxis: sq heparin  Code Status: Partial, no intubation  Family Communication: Discussed with patient  Consults called: Nephrology  Admission status: Observation     Vianne Bulls, MD [facility] Pager [phone]  If 7PM-7AM, please contact night-coverage [email or web address] Password [facility]  08/08/2018, 8:05 PM

## 2018-08-08 NOTE — ED Notes (Signed)
RN sent 2 visitors to Pt Rm

## 2018-08-08 NOTE — ED Notes (Signed)
Patient ambulated to restroom with steady gait.

## 2018-08-08 NOTE — Consult Note (Addendum)
Reason for Consult: Renal failure Referring Physician:  Dr. Myna Hidalgo  Chief Complaint: Abnormal labs and nausea  Assessment/Plan: 1. AKI on CKD IIIB - Question is whether she's had progression of renal failure given significant kidney disease with a Cr of 1.7 06/2016 in an elderly pt leading to uremia with nausea/ vomiting vs nausea/ vomiting resulting in AKI from volume depletion. Ultrasound shows no obstruction with kidneys in the normal range, albeit on the smaller side but with increased echogenicity. Sediment on urine microscopy does not appear to be active. - Clinically she actually appears to be prerenal and I would recommend treating her with isotonic fluids with D5W+3 amps HCO3 and we will reassess in the AM. Na will increase distal Na/K exchange and HCO3 help shift the K intracellular as well. No widening of the QRS and no indication for dialysis at this time. - Will check a SPEP/UPEP, UPC  - If there is no improvement then will send completion serologies and consider a renal biopsy. - Agree with holding the lisinopril. - Anemia concerning that this may be progression of CKD. 2. Hypertension 3. DM 4. Anemia 5. AVR 6. TAH     HPI: SEE Sandra Sheppard is an 73 y.o. female HTN HLD DM CKD III with episodes of AKI in the past as early as 2010 with creatinine peaking in the high 2's but appears creatinine settled in at 1.64 07/04/2016. According to the pt she has annual labs drawn and her PMD is Dr. Dagmar Hait. She was diagnosed with an ear infection treated with Levaquin a week ago but on Saturday she started to have nausea, vomiting and diarrhea. She states she was able to keep fluids and food down; diarrhea was not a large amount. She denies f/c/rashes/dysuria but has noted decreased uop. She also denies hematuria, foamy urine or nephrolithiasis. She also had dizziness but denies diplopia, falls or loss of consciousness. She denies NSAID use or exposure to sick  Contacts. She was seen by Dr. Dagmar Hait and with  abnormal lab work was subsequently sent to the ED where her BUN/Cr was noted to be 99/12.67 with a potassium of 6.6 and HCO3 of 11. She was treated with protocol for the hyperkalemia and u/s shows no obstruction.  ROS Pertinent items are noted in HPI.  Chemistry and CBC: Creatinine, Ser  Date/Time Value Ref Range Status  08/08/2018 05:53 PM 12.67 (H) 0.44 - 1.00 mg/dL Final  07/04/2016 12:11 PM 1.64 (H) 0.44 - 1.00 mg/dL Final  08/27/2013 02:31 PM 2.0 (H) 0.4 - 1.2 mg/dL Final  08/07/2013 03:19 PM 2.5 (H) 0.4 - 1.2 mg/dL Final  06/28/2013 12:43 PM 1.6 (H) 0.4 - 1.2 mg/dL Final  01/14/2010 01:09 PM 1.27 (H) 0.4 - 1.2 mg/dL Final  08/25/2008 03:30 AM 1.54 (H) 0.4 - 1.2 mg/dL Final  08/24/2008 06:00 AM 1.71 (H) 0.4 - 1.2 mg/dL Final  08/23/2008 04:30 AM 2.09 (H) 0.4 - 1.2 mg/dL Final  08/22/2008 04:00 AM 1.98 (H) 0.4 - 1.2 mg/dL Final  08/21/2008 04:20 AM 2.02 (H) 0.4 - 1.2 mg/dL Final  08/20/2008 04:30 AM 2.21 (H) 0.4 - 1.2 mg/dL Final  08/19/2008 03:45 AM 2.03 (H) 0.4 - 1.2 mg/dL Final  08/18/2008 04:20 AM 2.32 (H) 0.4 - 1.2 mg/dL Final  08/17/2008 03:55 AM 2.39 (H) 0.4 - 1.2 mg/dL Final  08/16/2008 05:18 PM 2.6 (H) 0.4 - 1.2 mg/dL Final  08/16/2008 06:04 AM 2.5 (H) 0.4 - 1.2 mg/dL Final  08/16/2008 04:00 AM 2.59 (H) 0.4 - 1.2 mg/dL  Final  08/15/2008 04:40 PM 2.02 (H) 0.4 - 1.2 mg/dL Final  08/15/2008 04:38 PM 1.9 (H) 0.4 - 1.2 mg/dL Final  08/15/2008 04:00 AM 1.15 0.4 - 1.2 mg/dL Final  08/14/2008 08:42 PM 0.8 0.4 - 1.2 mg/dL Final  08/14/2008 08:09 PM 0.8 0.4 - 1.2 mg/dL Final  08/14/2008 08:00 PM 0.84 0.4 - 1.2 mg/dL Final  08/08/2008 09:40 AM 0.99 0.4 - 1.2 mg/dL Final  06/25/2008 04:09 PM 1.1 0.4 - 1.2 mg/dL Final   Recent Labs  Lab 08/08/18 1753  NA 136  K 6.6*  CL 108  CO2 11*  GLUCOSE 105*  BUN 99*  CREATININE 12.67*  CALCIUM 7.3*   Recent Labs  Lab 08/08/18 1753  WBC 8.7  NEUTROABS 6.2  HGB 9.2*  HCT 29.9*  MCV 93.7  PLT 166   Liver Function  Tests: Recent Labs  Lab 08/08/18 1753  AST 11*  ALT 6  ALKPHOS 59  BILITOT 0.4  PROT 6.3*  ALBUMIN 3.3*   No results for input(s): LIPASE, AMYLASE in the last 168 hours. No results for input(s): AMMONIA in the last 168 hours. Cardiac Enzymes: No results for input(s): CKTOTAL, CKMB, CKMBINDEX, TROPONINI in the last 168 hours. Iron Studies: No results for input(s): IRON, TIBC, TRANSFERRIN, FERRITIN in the last 72 hours. PT/INR: @LABRCNTIP (inr:5)  Xrays/Other Studies: ) Results for orders placed or performed during the hospital encounter of 08/08/18 (from the past 48 hour(s))  CBC with Differential/Platelet     Status: Abnormal   Collection Time: 08/08/18  5:53 PM  Result Value Ref Range   WBC 8.7 4.0 - 10.5 K/uL   RBC 3.19 (L) 3.87 - 5.11 MIL/uL   Hemoglobin 9.2 (L) 12.0 - 15.0 g/dL   HCT 29.9 (L) 36.0 - 46.0 %   MCV 93.7 80.0 - 100.0 fL   MCH 28.8 26.0 - 34.0 pg   MCHC 30.8 30.0 - 36.0 g/dL   RDW 14.9 11.5 - 15.5 %   Platelets 166 150 - 400 K/uL   nRBC 0.0 0.0 - 0.2 %   Neutrophils Relative % 71 %   Neutro Abs 6.2 1.7 - 7.7 K/uL   Lymphocytes Relative 13 %   Lymphs Abs 1.2 0.7 - 4.0 K/uL   Monocytes Relative 9 %   Monocytes Absolute 0.8 0.1 - 1.0 K/uL   Eosinophils Relative 1 %   Eosinophils Absolute 0.1 0.0 - 0.5 K/uL   Basophils Relative 1 %   Basophils Absolute 0.1 0.0 - 0.1 K/uL   Immature Granulocytes 5 %   Abs Immature Granulocytes 0.45 (H) 0.00 - 0.07 K/uL    Comment: Performed at [facility], [address], [place], [place] [address]  Basic metabolic panel     Status: Abnormal   Collection Time: 08/08/18  5:53 PM  Result Value Ref Range   Sodium 136 135 - 145 mmol/L   Potassium 6.6 (HH) 3.5 - 5.1 mmol/L    Comment: NO VISIBLE HEMOLYSIS CRITICAL RESULT CALLED TO, READ BACK BY AND VERIFIED WITH: K RANDS,RN 1858 08/08/2018 WBOND    Chloride 108 98 - 111 mmol/L   CO2 11 (L) 22 - 32 mmol/L   Glucose, Bld 105 (H) 70 - 99 mg/dL   BUN 99 (H) 8 - 23  mg/dL   Creatinine, Ser 12.67 (H) 0.44 - 1.00 mg/dL   Calcium 7.3 (L) 8.9 - 10.3 mg/dL   GFR calc non Af Amer 3 (L) >60 mL/min   GFR calc Af Amer 3 (  L) >60 mL/min   Anion gap 17 (H) 5 - 15    Comment: Performed at [facility] [address], [place], [place] [address]  Hepatic function panel     Status: Abnormal   Collection Time: 08/08/18  5:53 PM  Result Value Ref Range   Total Protein 6.3 (L) 6.5 - 8.1 g/dL   Albumin 3.3 (L) 3.5 - 5.0 g/dL   AST 11 (L) 15 - 41 U/L   ALT 6 0 - 44 U/L   Alkaline Phosphatase 59 38 - 126 U/L   Total Bilirubin 0.4 0.3 - 1.2 mg/dL   Bilirubin, Direct <0.1 0.0 - 0.2 mg/dL   Indirect Bilirubin NOT CALCULATED 0.3 - 0.9 mg/dL    Comment: Performed at [facility] [address], [place], [place] [address]  Urinalysis, Routine w reflex microscopic     Status: Abnormal   Collection Time: 08/08/18  6:12 PM  Result Value Ref Range   Color, Urine YELLOW YELLOW   APPearance CLEAR CLEAR   Specific Gravity, Urine 1.011 1.005 - 1.030   pH 5.0 5.0 - 8.0   Glucose, UA 50 (A) NEGATIVE mg/dL   Hgb urine dipstick MODERATE (A) NEGATIVE   Bilirubin Urine NEGATIVE NEGATIVE   Ketones, ur NEGATIVE NEGATIVE mg/dL   Protein, ur 100 (A) NEGATIVE mg/dL   Nitrite NEGATIVE NEGATIVE   Leukocytes,Ua NEGATIVE NEGATIVE   RBC / HPF 0-5 0 - 5 RBC/hpf   WBC, UA 0-5 0 - 5 WBC/hpf   Bacteria, UA RARE (A) Sheppard SEEN   Squamous Epithelial / LPF 0-5 0 - 5   Amorphous Crystal PRESENT     Comment: Performed at [facility], [place] [address] [place], [place] [address]   US Renal  Result Date: 08/08/2018 CLINICAL DATA:  Renal failure EXAM: RENAL / URINARY TRACT ULTRASOUND COMPLETE COMPARISON:  Sheppard. FINDINGS: Right Kidney: Renal measurements: 10.9 x 4.7 x 5.3 cm = volume: 145 mL. Diffusely increased parenchymal echotexture suggesting chronic medical renal disease. No hydronephrosis. Left Kidney: Renal measurements: 10.8 x 4.9 x 4.5 cm = volume: 127 mL. Small simple  appearing cyst in the midpole measuring 1.2 cm maximal diameter. Diffusely increased parenchymal echotexture suggesting chronic medical disease. No hydronephrosis. Bladder: Appears normal for degree of bladder distention. Incidental note of cholelithiasis. IMPRESSION: 1. Increased parenchymal echotexture in the kidney suggesting chronic medical renal disease. No hydronephrosis. 2. Small benign-appearing cyst on the left kidney. 3. Incidental note of cholelithiasis. Electronically Signed   By: Lucienne Capers M.D.   On: 08/08/2018 20:48    PMH:   Past Medical History:  Diagnosis Date  . ABDOMINAL AORTIC ANEURYSM   . CAROTID STENOSIS   . FIBRILLATION, ATRIAL   . HYPERCHOLESTEROLEMIA  IIA   . HYPERTENSION, MILD   . MURMUR   . PERIPHERAL VASCULAR DISEASE   . STENOSIS, MITRAL AND AORTIC VALVES     PSH:   Past Surgical History:  Procedure Laterality Date  . AORTIC VALVE REPLACEMENT    . TOTAL ABDOMINAL HYSTERECTOMY      Allergies:  Allergies  Allergen Reactions  . Codeine     Rash, Nausea and Vomiting   . Sulfonamide Derivatives   . Penicillins Rash    Has patient had a PCN reaction causing immediate rash, facial/tongue/throat swelling, SOB or lightheadedness with hypotension: YES Has patient had a PCN reaction causing severe rash involving mucus membranes or skin necrosis:NO Has patient had a PCN reaction that required hospitalization NO  Has patient had a PCN reaction occurring within the last 10 years: NO If all of the above answers are "NO", then may proceed with Cephalosporin use.    Medications:   Prior to Admission medications   Medication Sig Start Date End Date Taking? Authorizing Provider  amLODipine (NORVASC) 5 MG tablet Take 5 mg by mouth daily.    [provider]  aspirin 81 MG tablet Take 81 mg by mouth daily.      [provider]  Cholecalciferol (VITAMIN D-3) 1000 UNITS CAPS Take 1,000 Units by mouth daily.    [provider]   fluticasone (FLONASE) 50 MCG/ACT nasal spray Place 1-2 sprays into both nostrils as needed. 07/06/18   [provider]  lisinopril-hydrochlorothiazide (PRINZIDE,ZESTORETIC) 20-25 MG per tablet Take 1 tablet by mouth daily. 06/11/13   Minus Breeding, MD  metFORMIN (GLUCOPHAGE-XR) 500 MG 24 hr tablet Take 500 mg by mouth 2 (two) times daily. 04/22/15   [provider]  Multiple Vitamin (MULTIVITAMIN) capsule Take 1 capsule by mouth daily.      [provider]  pravastatin (PRAVACHOL) 40 MG tablet Take 40 mg by mouth daily.      [provider]    Discontinued Meds:  There are no discontinued medications.  Social History:  reports that she has quit smoking. She has never used smokeless tobacco. She reports that she does not drink alcohol. No history on file for drug.  Family History:   Family History  Problem Relation Age of Onset  . Colon polyps Other     Blood pressure 110/77, pulse 74, temperature 98.3 F (36.8 C), temperature source Oral, resp. rate 18, SpO2 99 %. General appearance: alert, cooperative and appears stated age Head: Normocephalic, without obvious abnormality, atraumatic Eyes: negative Neck: no adenopathy, no carotid bruit, no JVD, supple, symmetrical, trachea midline and thyroid not enlarged, symmetric, no tenderness/mass/nodules Back: symmetric, no curvature. ROM normal. No CVA tenderness. Resp: clear to auscultation bilaterally Chest wall: no tenderness Cardio: S1, S2 normal GI: soft, non-tender; bowel sounds normal; no masses,  no organomegaly Extremities: extremities normal, atraumatic, no cyanosis or edema Pulses: 2+ and symmetric Skin: Tenting Lymph nodes: Cervical, supraclavicular, and axillary nodes normal. Neurologic: Grossly normal       Reis Pienta, Hunt Oris, MD 08/08/2018, 9:21 PM

## 2018-08-08 NOTE — ED Notes (Addendum)
Pt BIB [facility] from PCP, recent ear infection started on levaquin, at PCP for follow-up, blood work concerning for acute kidney injury.

## 2018-08-08 NOTE — ED Triage Notes (Signed)
Patient sent here for acute kidney injury. Reports recent ear infection.

## 2018-08-09 DIAGNOSIS — R69 Illness, unspecified: Secondary | ICD-10-CM | POA: Diagnosis not present

## 2018-08-09 DIAGNOSIS — Z681 Body mass index (BMI) 19 or less, adult: Secondary | ICD-10-CM | POA: Diagnosis not present

## 2018-08-09 DIAGNOSIS — H6690 Otitis media, unspecified, unspecified ear: Secondary | ICD-10-CM | POA: Diagnosis not present

## 2018-08-09 DIAGNOSIS — Z515 Encounter for palliative care: Secondary | ICD-10-CM | POA: Diagnosis not present

## 2018-08-09 DIAGNOSIS — Z66 Do not resuscitate: Secondary | ICD-10-CM | POA: Diagnosis not present

## 2018-08-09 DIAGNOSIS — N179 Acute kidney failure, unspecified: Secondary | ICD-10-CM | POA: Diagnosis not present

## 2018-08-09 DIAGNOSIS — E46 Unspecified protein-calorie malnutrition: Secondary | ICD-10-CM | POA: Diagnosis not present

## 2018-08-09 DIAGNOSIS — E872 Acidosis: Secondary | ICD-10-CM | POA: Diagnosis not present

## 2018-08-09 DIAGNOSIS — R3129 Other microscopic hematuria: Secondary | ICD-10-CM | POA: Diagnosis present

## 2018-08-09 DIAGNOSIS — D631 Anemia in chronic kidney disease: Secondary | ICD-10-CM | POA: Diagnosis not present

## 2018-08-09 DIAGNOSIS — M2550 Pain in unspecified joint: Secondary | ICD-10-CM | POA: Diagnosis not present

## 2018-08-09 DIAGNOSIS — R5381 Other malaise: Secondary | ICD-10-CM | POA: Diagnosis not present

## 2018-08-09 DIAGNOSIS — I4891 Unspecified atrial fibrillation: Secondary | ICD-10-CM | POA: Diagnosis present

## 2018-08-09 DIAGNOSIS — F05 Delirium due to known physiological condition: Secondary | ICD-10-CM | POA: Diagnosis not present

## 2018-08-09 DIAGNOSIS — E875 Hyperkalemia: Secondary | ICD-10-CM | POA: Diagnosis not present

## 2018-08-09 DIAGNOSIS — K5900 Constipation, unspecified: Secondary | ICD-10-CM | POA: Diagnosis not present

## 2018-08-09 DIAGNOSIS — E1151 Type 2 diabetes mellitus with diabetic peripheral angiopathy without gangrene: Secondary | ICD-10-CM | POA: Diagnosis not present

## 2018-08-09 DIAGNOSIS — R262 Difficulty in walking, not elsewhere classified: Secondary | ICD-10-CM | POA: Diagnosis not present

## 2018-08-09 DIAGNOSIS — E1159 Type 2 diabetes mellitus with other circulatory complications: Secondary | ICD-10-CM | POA: Diagnosis not present

## 2018-08-09 DIAGNOSIS — N2581 Secondary hyperparathyroidism of renal origin: Secondary | ICD-10-CM | POA: Diagnosis not present

## 2018-08-09 DIAGNOSIS — K8020 Calculus of gallbladder without cholecystitis without obstruction: Secondary | ICD-10-CM | POA: Diagnosis present

## 2018-08-09 DIAGNOSIS — I480 Paroxysmal atrial fibrillation: Secondary | ICD-10-CM | POA: Diagnosis not present

## 2018-08-09 DIAGNOSIS — D649 Anemia, unspecified: Secondary | ICD-10-CM | POA: Diagnosis not present

## 2018-08-09 DIAGNOSIS — N183 Chronic kidney disease, stage 3 (moderate): Secondary | ICD-10-CM | POA: Diagnosis not present

## 2018-08-09 DIAGNOSIS — Q6101 Congenital single renal cyst: Secondary | ICD-10-CM | POA: Diagnosis not present

## 2018-08-09 DIAGNOSIS — E559 Vitamin D deficiency, unspecified: Secondary | ICD-10-CM | POA: Diagnosis not present

## 2018-08-09 DIAGNOSIS — D519 Vitamin B12 deficiency anemia, unspecified: Secondary | ICD-10-CM | POA: Diagnosis not present

## 2018-08-09 DIAGNOSIS — E860 Dehydration: Secondary | ICD-10-CM | POA: Diagnosis present

## 2018-08-09 DIAGNOSIS — E538 Deficiency of other specified B group vitamins: Secondary | ICD-10-CM | POA: Diagnosis not present

## 2018-08-09 DIAGNOSIS — R7989 Other specified abnormal findings of blood chemistry: Secondary | ICD-10-CM | POA: Diagnosis present

## 2018-08-09 DIAGNOSIS — M6281 Muscle weakness (generalized): Secondary | ICD-10-CM | POA: Diagnosis not present

## 2018-08-09 DIAGNOSIS — I129 Hypertensive chronic kidney disease with stage 1 through stage 4 chronic kidney disease, or unspecified chronic kidney disease: Secondary | ICD-10-CM | POA: Diagnosis not present

## 2018-08-09 DIAGNOSIS — N170 Acute kidney failure with tubular necrosis: Secondary | ICD-10-CM | POA: Diagnosis not present

## 2018-08-09 DIAGNOSIS — I10 Essential (primary) hypertension: Secondary | ICD-10-CM | POA: Diagnosis not present

## 2018-08-09 DIAGNOSIS — E785 Hyperlipidemia, unspecified: Secondary | ICD-10-CM | POA: Diagnosis not present

## 2018-08-09 DIAGNOSIS — R4020 Unspecified coma: Secondary | ICD-10-CM | POA: Diagnosis not present

## 2018-08-09 DIAGNOSIS — I714 Abdominal aortic aneurysm, without rupture: Secondary | ICD-10-CM | POA: Diagnosis not present

## 2018-08-09 DIAGNOSIS — E119 Type 2 diabetes mellitus without complications: Secondary | ICD-10-CM | POA: Diagnosis not present

## 2018-08-09 DIAGNOSIS — D509 Iron deficiency anemia, unspecified: Secondary | ICD-10-CM | POA: Diagnosis not present

## 2018-08-09 DIAGNOSIS — R627 Adult failure to thrive: Secondary | ICD-10-CM | POA: Diagnosis not present

## 2018-08-09 DIAGNOSIS — E1122 Type 2 diabetes mellitus with diabetic chronic kidney disease: Secondary | ICD-10-CM | POA: Diagnosis not present

## 2018-08-09 DIAGNOSIS — Z7401 Bed confinement status: Secondary | ICD-10-CM | POA: Diagnosis not present

## 2018-08-09 DIAGNOSIS — R609 Edema, unspecified: Secondary | ICD-10-CM | POA: Diagnosis not present

## 2018-08-09 DIAGNOSIS — I6529 Occlusion and stenosis of unspecified carotid artery: Secondary | ICD-10-CM | POA: Diagnosis not present

## 2018-08-09 DIAGNOSIS — R112 Nausea with vomiting, unspecified: Secondary | ICD-10-CM | POA: Diagnosis not present

## 2018-08-09 LAB — RENAL FUNCTION PANEL
Albumin: 3.1 g/dL — ABNORMAL LOW (ref 3.5–5.0)
Anion gap: 17 — ABNORMAL HIGH (ref 5–15)
BUN: 94 mg/dL — ABNORMAL HIGH (ref 8–23)
CO2: 12 mmol/L — ABNORMAL LOW (ref 22–32)
Calcium: 7.3 mg/dL — ABNORMAL LOW (ref 8.9–10.3)
Chloride: 109 mmol/L (ref 98–111)
Creatinine, Ser: 12.06 mg/dL — ABNORMAL HIGH (ref 0.44–1.00)
GFR calc Af Amer: 3 mL/min — ABNORMAL LOW (ref >60)
GFR calc non Af Amer: 3 mL/min — ABNORMAL LOW (ref >60)
Glucose, Bld: 165 mg/dL — ABNORMAL HIGH (ref 70–99)
Phosphorus: 7.7 mg/dL — ABNORMAL HIGH (ref 2.5–4.6)
Potassium: 5.5 mmol/L — ABNORMAL HIGH (ref 3.5–5.1)
Sodium: 138 mmol/L (ref 135–145)

## 2018-08-09 LAB — IRON AND TIBC
Iron: 78 ug/dL (ref 28–170)
Saturation Ratios: 29 % (ref 10.4–31.8)
TIBC: 265 ug/dL (ref 250–450)
UIBC: 187 ug/dL

## 2018-08-09 LAB — GLUCOSE, CAPILLARY
Glucose-Capillary: 103 mg/dL — ABNORMAL HIGH (ref 70–99)
Glucose-Capillary: 128 mg/dL — ABNORMAL HIGH (ref 70–99)
Glucose-Capillary: 130 mg/dL — ABNORMAL HIGH (ref 70–99)
Glucose-Capillary: 91 mg/dL (ref 70–99)

## 2018-08-09 LAB — BASIC METABOLIC PANEL
Anion gap: 19 — ABNORMAL HIGH (ref 5–15)
BUN: 96 mg/dL — ABNORMAL HIGH (ref 8–23)
CO2: 10 mmol/L — ABNORMAL LOW (ref 22–32)
Calcium: 7.1 mg/dL — ABNORMAL LOW (ref 8.9–10.3)
Chloride: 110 mmol/L (ref 98–111)
Creatinine, Ser: 12.18 mg/dL — ABNORMAL HIGH (ref 0.44–1.00)
GFR calc Af Amer: 3 mL/min — ABNORMAL LOW (ref >60)
GFR calc non Af Amer: 3 mL/min — ABNORMAL LOW (ref >60)
Glucose, Bld: 119 mg/dL — ABNORMAL HIGH (ref 70–99)
Potassium: 5.4 mmol/L — ABNORMAL HIGH (ref 3.5–5.1)
Sodium: 139 mmol/L (ref 135–145)

## 2018-08-09 LAB — PROTEIN / CREATININE RATIO, URINE
Creatinine, Urine: 62.29 mg/dL
Protein Creatinine Ratio: 1.53 mg/mg{Cre} — ABNORMAL HIGH (ref 0.00–0.15)
Total Protein, Urine: 95 mg/dL

## 2018-08-09 LAB — POTASSIUM
Potassium: 5.2 mmol/L — ABNORMAL HIGH (ref 3.5–5.1)
Potassium: 5.3 mmol/L — ABNORMAL HIGH (ref 3.5–5.1)
Potassium: 5.5 mmol/L — ABNORMAL HIGH (ref 3.5–5.1)
Potassium: 5.6 mmol/L — ABNORMAL HIGH (ref 3.5–5.1)
Potassium: 5.6 mmol/L — ABNORMAL HIGH (ref 3.5–5.1)

## 2018-08-09 LAB — CREATININE, URINE, RANDOM: Creatinine, Urine: 61.45 mg/dL

## 2018-08-09 LAB — VITAMIN B12: Vitamin B-12: 117 pg/mL — ABNORMAL LOW (ref 180–914)

## 2018-08-09 LAB — CBC
HCT: 24.7 % — ABNORMAL LOW (ref 36.0–46.0)
Hemoglobin: 8 g/dL — ABNORMAL LOW (ref 12.0–15.0)
MCH: 29.4 pg (ref 26.0–34.0)
MCHC: 32.4 g/dL (ref 30.0–36.0)
MCV: 90.8 fL (ref 80.0–100.0)
Platelets: 156 10*3/uL (ref 150–400)
RBC: 2.72 MIL/uL — ABNORMAL LOW (ref 3.87–5.11)
RDW: 14.6 % (ref 11.5–15.5)
WBC: 6.9 10*3/uL (ref 4.0–10.5)
nRBC: 0 % (ref 0.0–0.2)

## 2018-08-09 LAB — SODIUM, URINE, RANDOM: Sodium, Ur: 78 mmol/L

## 2018-08-09 LAB — FERRITIN: Ferritin: 54 ng/mL (ref 11–307)

## 2018-08-09 LAB — LACTATE DEHYDROGENASE: LDH: 210 U/L — ABNORMAL HIGH (ref 98–192)

## 2018-08-09 MED ORDER — PANTOPRAZOLE SODIUM 40 MG PO TBEC
40.0000 mg | DELAYED_RELEASE_TABLET | Freq: Every day | ORAL | Status: DC
  Administered 2018-08-09 – 2018-08-31 (×23): 40 mg via ORAL
  Filled 2018-08-09 (×24): qty 1

## 2018-08-09 MED ORDER — STERILE WATER FOR INJECTION IV SOLN
INTRAVENOUS | Status: DC
  Administered 2018-08-09 – 2018-08-11 (×5): via INTRAVENOUS
  Filled 2018-08-09 (×12): qty 850

## 2018-08-09 MED ORDER — SODIUM ZIRCONIUM CYCLOSILICATE 5 G PO PACK
5.0000 g | PACK | Freq: Four times a day (QID) | ORAL | Status: DC
  Filled 2018-08-09 (×2): qty 1

## 2018-08-09 MED ORDER — SODIUM CHLORIDE 0.9 % IV SOLN
INTRAVENOUS | Status: DC | PRN
  Administered 2018-08-09: 250 mL via INTRAVENOUS

## 2018-08-09 MED ORDER — AMLODIPINE BESYLATE 10 MG PO TABS
10.0000 mg | ORAL_TABLET | Freq: Every day | ORAL | Status: DC
  Administered 2018-08-10 – 2018-08-31 (×22): 10 mg via ORAL
  Filled 2018-08-09 (×23): qty 1

## 2018-08-09 MED ORDER — PATIROMER SORBITEX CALCIUM 8.4 G PO PACK
8.4000 g | PACK | Freq: Every day | ORAL | Status: DC
  Administered 2018-08-09: 8.4 g via ORAL
  Filled 2018-08-09: qty 1

## 2018-08-09 MED ORDER — SODIUM CHLORIDE 0.9 % IV SOLN
INTRAVENOUS | Status: DC
  Administered 2018-08-09: 10:00:00 via INTRAVENOUS

## 2018-08-09 MED ORDER — SODIUM ZIRCONIUM CYCLOSILICATE 5 G PO PACK
5.0000 g | PACK | Freq: Four times a day (QID) | ORAL | Status: AC
  Administered 2018-08-09 – 2018-08-10 (×2): 5 g via ORAL
  Filled 2018-08-09 (×3): qty 1

## 2018-08-09 MED ORDER — CALCIUM GLUCONATE-NACL 1-0.675 GM/50ML-% IV SOLN
1.0000 g | Freq: Once | INTRAVENOUS | Status: AC
  Administered 2018-08-09: 1000 mg via INTRAVENOUS
  Filled 2018-08-09: qty 50

## 2018-08-09 NOTE — Care Management Obs Status (Signed)
[facility] NOTIFICATION   Patient Details  Name: Sandra Sheppard MRN: [id number] Date of Birth: 07-23-1945   Medicare Observation Status Notification Given:  Yes    Carles Collet, RN 08/09/2018, 9:21 AM

## 2018-08-09 NOTE — Progress Notes (Signed)
Progress Note    Sandra Sheppard  TWS:[id number] DOB: 11/27/1945  DOA: 08/08/2018 PCP: Prince Solian, MD    Brief Narrative:   Chief complaint: Abnormal labs  Medical records reviewed and are as summarized below:  Sandra Sheppard is an 73 y.o. female with past medical history significant for hypertension, hyperlipidemia, diabetes mellitus type 2, and CKD stage III; who presented with complaints of normal outpatient blood work.  Assessment/Plan:   Principal Problem:   Renal failure Active Problems:   HYPERTENSION, MILD   Hyperkalemia   Metabolic acidosis, increased anion gap   Normocytic anemia   Diabetes mellitus type II, non insulin dependent (HCC)  Acute acute renal failure superimposed on chronic kidney disease stage III: Patient baseline creatinine previously noted to be around 1.5-2 , but presents with creatinine 12.67 with BUN 99.  He was started on IV fluids and nephrology consulted.  Renal ultrasound did not show hydronephrosis, but did note a small benign appearing left renal cyst and cholelithiasis.  Hospital day 2, creatinine slightly improved at 12.18 -Strict intake and output -Hold nephrotoxic agents -Follow-up UPEP/SPEP -Continue IV fluids sodium bicarb gtt at 125 mL/h per nephrology -Appreciate nephrology consultative services will follow-up for further recommendation  Hyperkalemia: Resolving.  Admission potassium was elevated up to 6.6 on admission.  Repeat potassium this a.m. 5.6.  Nephrology added on veltassa -Continue Veltassa daily    Metabolic acidosis: Multifactorial in setting of acute renal failure and metformin use. -Bicarb drip per nephrology  Normocytic anemia: Hemoglobin 9.2 on admission and repeat check 8 g/dL today.  Suspect secondary to above.  -Follow-up iron studies, vitamin B12, haptoglobin, LDH, -Follow-up stool guaiac  -Transfuse blood products if needed found to be bleeding  Nausea, vomiting, and diarrhea: Resolved.  Patient noted to  be tolerating breakfast okay this morning. -Zofran as needed  Essential hypertension: Blood pressures seem to be elevated 141/69-156/63 -Hold hydrochlorothiazide and lisinopril -Increase amlodipine to 10 mg daily  Diabetes mellitus type 2: Hemoglobin A1c 6.2 on 06/29/2018 as seen on scanned documents.  Nephrology recommended not resuming metformin given chronic kidney disease 3-4 at baseline. -Hypoglycemic protocols -CBGs q. before meals and at bedtime with sensitive SSI  Hyperlipidemia  -Continue pravastatin  GI prophylaxis: Given significant elevated BUN risk of bleeding is elevated. -Protonix daily  Body mass index is 19.57 kg/m.   Family Communication/Anticipated D/C date and plan/Code Status   DVT prophylaxis: SCD Code Status: partial Code.  Family Communication: Discussed plan of care with the patient family present at bedside Disposition Plan: Likely discharge home and 3 to 4 days   Medical Consultants:    Nephrology Dr.Colodonato   Anti-Infectives:    None  Subjective:   Patient reports that she had no problems overnight, and reports urine output has been a little bit better than it had been prior to coming into the hospital.  Denies any reports of bleeding.  Objective:    Vitals:   08/08/18 2151 08/09/18 0631 08/09/18 1019 08/09/18 1133  BP: (!) 153/71 (!) 135/58 (!) 141/69 (!) 156/63  Pulse:  72  88  Resp: 13 12  16   Temp: 98 F (36.7 C) 98.1 F (36.7 C)  98.7 F (37.1 C)  TempSrc: Oral Oral  Oral  SpO2: 100% 95%  98%  Weight: 51.7 kg     Height: 5\' 4"  (1.626 m)       Intake/Output Summary (Last 24 hours) at 08/09/2018 1302 Last data filed at 08/09/2018 1156 Gross per 24  hour  Intake 920.98 ml  Output 120 ml  Net 800.98 ml   Filed Weights   08/08/18 2151  Weight: 51.7 kg    Exam: Constitutional: Elderly female in NAD, calm, comfortable Eyes: PERRL, lids and conjunctivae normal ENMT: Mucous membranes are moist. Posterior pharynx clear  of any exudate or lesions. Hard of hearing Neck: normal, supple, no masses, no thyromegaly Respiratory: Normal respiratory effort with bibasilar crackles appreciated.  No significant wheezes or rhonchi noted.  Patient on room air and able to talk in complete sentences.  Cardiovascular: Regular rate and rhythm, no murmurs / rubs / gallops. No extremity edema. 2+ pedal pulses. No carotid bruits.  Abdomen: no tenderness, no masses palpated. No hepatosplenomegaly. Bowel sounds positive.  Musculoskeletal: no clubbing / cyanosis. No joint deformity upper and lower extremities. Good ROM, no contractures. Normal muscle tone.  Skin: Pallor present, no rashes, lesions, ulcers. No induration Neurologic: CN 2-12 grossly intact. Sensation intact, DTR normal. Strength 5/5 in all 4.  Psychiatric: Normal judgment and insight. Alert and oriented x 3. Normal mood.    Data Reviewed:   I have personally reviewed following labs and imaging studies:  Labs: Labs show the following:   Basic Metabolic Panel: Recent Labs  Lab 08/08/18 1753  08/09/18 0456 08/09/18 0742 08/09/18 1122  NA 136  --  139  --   --   K 6.6*   < > 5.4* 5.3* 5.6*  CL 108  --  110  --   --   CO2 11*  --  10*  --   --   GLUCOSE 105*  --  119*  --   --   BUN 99*  --  96*  --   --   CREATININE 12.67*  --  12.18*  --   --   CALCIUM 7.3*  --  7.1*  --   --    < > = values in this interval not displayed.   GFR Estimated Creatinine Clearance: 3.4 mL/min (A) (by C-G formula based on SCr of 12.18 mg/dL (H)). Liver Function Tests: Recent Labs  Lab 08/08/18 1753  AST 11*  ALT 6  ALKPHOS 59  BILITOT 0.4  PROT 6.3*  ALBUMIN 3.3*   No results for input(s): LIPASE, AMYLASE in the last 168 hours. No results for input(s): AMMONIA in the last 168 hours. Coagulation profile No results for input(s): INR, PROTIME in the last 168 hours.  CBC: Recent Labs  Lab 08/08/18 1753 08/09/18 0456  WBC 8.7 6.9  NEUTROABS 6.2  --   HGB 9.2*  8.0*  HCT 29.9* 24.7*  MCV 93.7 90.8  PLT 166 156   Cardiac Enzymes: No results for input(s): CKTOTAL, CKMB, CKMBINDEX, TROPONINI in the last 168 hours. BNP (last 3 results) No results for input(s): PROBNP in the last 8760 hours. CBG: Recent Labs  Lab 08/08/18 2241 08/08/18 2352 08/09/18 0644 08/09/18 1131  GLUCAP 186* 243* 103* 128*   D-Dimer: No results for input(s): DDIMER in the last 72 hours. Hgb A1c: No results for input(s): HGBA1C in the last 72 hours. Lipid Profile: No results for input(s): CHOL, HDL, LDLCALC, TRIG, CHOLHDL, LDLDIRECT in the last 72 hours. Thyroid function studies: No results for input(s): TSH, T4TOTAL, T3FREE, THYROIDAB in the last 72 hours.  Invalid input(s): FREET3 Anemia work up: No results for input(s): VITAMINB12, FOLATE, FERRITIN, TIBC, IRON, RETICCTPCT in the last 72 hours. Sepsis Labs: Recent Labs  Lab 08/08/18 1753 08/09/18 0456  WBC 8.7 6.9  Microbiology No results found for this or any previous visit (from the past 240 hour(s)).  Procedures and diagnostic studies:  US Renal  Result Date: 09/02/18 CLINICAL DATA:  Renal failure EXAM: RENAL / URINARY TRACT ULTRASOUND COMPLETE COMPARISON:  None. FINDINGS: Right Kidney: Renal measurements: 10.9 x 4.7 x 5.3 cm = volume: 145 mL. Diffusely increased parenchymal echotexture suggesting chronic medical renal disease. No hydronephrosis. Left Kidney: Renal measurements: 10.8 x 4.9 x 4.5 cm = volume: 127 mL. Small simple appearing cyst in the midpole measuring 1.2 cm maximal diameter. Diffusely increased parenchymal echotexture suggesting chronic medical disease. No hydronephrosis. Bladder: Appears normal for degree of bladder distention. Incidental note of cholelithiasis. IMPRESSION: 1. Increased parenchymal echotexture in the kidney suggesting chronic medical renal disease. No hydronephrosis. 2. Small benign-appearing cyst on the left kidney. 3. Incidental note of cholelithiasis.  Electronically Signed   By: Lucienne Capers M.D.   On: Sep 02, 2018 20:48    Medications:   . amLODipine  5 mg Oral Daily  . aspirin EC  81 mg Oral Daily  . heparin  5,000 Units Subcutaneous Q8H  . insulin aspart  0-5 Units Subcutaneous QHS  . insulin aspart  0-9 Units Subcutaneous TID WC  . patiromer  8.4 g Oral Daily  . pravastatin  10 mg Oral q1800  . sodium chloride flush  3 mL Intravenous Q12H   Continuous Infusions: . sodium chloride Stopped (08/09/18 1040)  .  sodium bicarbonate (isotonic) infusion in sterile water Stopped (08/09/18 1035)     LOS: 0 days   Rondell A Smith  [facility]   *Please refer to [email or web address], password [email or web address] to get updated schedule on who will round on this patient, as hospitalists switch teams weekly. If 7PM-7AM, please contact night-coverage at [email or web address], password [email or web address] for any overnight needs.

## 2018-08-09 NOTE — ED Provider Notes (Signed)
[facility] Provider Note   CSN: [id number] Arrival date & time: 08/08/18  1634    History   Chief Complaint No chief complaint on file.   HPI Sandra Sheppard is a 73 y.o. female.     Patient had lab work done by her primary care doctor on Monday.  She was contacted stating that her creatinine was 12.  She is here with family members.  Patient recently treated for an ear infection on antibiotics not clear which 1.  Seems that eventually we figured out it was Levaquin but not certain.  Patient states that she has had some weakness in and fatigue but otherwise she is felt fine.  States that for a period of several weeks urine output is been somewhat decreased.  Denies any pain with urination no blood in the urine.  No abdominal pain no chest pain no shortness of breath.     Past Medical History:  Diagnosis Date  . ABDOMINAL AORTIC ANEURYSM   . CAROTID STENOSIS   . FIBRILLATION, ATRIAL   . HYPERCHOLESTEROLEMIA  IIA   . HYPERTENSION, MILD   . MURMUR   . PERIPHERAL VASCULAR DISEASE   . STENOSIS, MITRAL AND AORTIC VALVES     Patient Active Problem List   Diagnosis Date Noted  . Renal failure 08/08/2018  . Hyperkalemia 08/08/2018  . Metabolic acidosis, increased anion gap 08/08/2018  . Normocytic anemia 08/08/2018  . Diabetes mellitus type II, non insulin dependent ([place]) 08/08/2018  . Bilateral carotid artery disease ([place]) 07/12/2018  . Dyslipidemia 07/12/2018  . Vomiting 07/04/2016  . Gastroenteritis 07/04/2016  . CKD (chronic kidney disease) stage 3, GFR 30-59 ml/min ([facility]) 07/04/2016  . Occlusion and stenosis of carotid artery 04/28/2010  . ABDOMINAL AORTIC ANEURYSM 04/28/2010  . Peripheral vascular disease ([place]) 12/08/2008  . STENOSIS, MITRAL AND AORTIC VALVES 10/07/2008  . FIBRILLATION, ATRIAL 10/07/2008  . H/O aortic valve replacement 08/14/2008  . HYPERCHOLESTEROLEMIA  IIA 06/23/2008  . HYPERTENSION, MILD 06/23/2008  . MURMUR 06/23/2008     Past Surgical History:  Procedure Laterality Date  . AORTIC VALVE REPLACEMENT    . TOTAL ABDOMINAL HYSTERECTOMY       OB History   No obstetric history on file.      Home Medications    Prior to Admission medications   Medication Sig Start Date End Date Taking? Authorizing Provider  amLODipine (NORVASC) 5 MG tablet Take 5 mg by mouth daily.    [provider]  aspirin 81 MG tablet Take 81 mg by mouth daily.      [provider]  Cholecalciferol (VITAMIN D-3) 1000 UNITS CAPS Take 1,000 Units by mouth daily.    [provider]  fluticasone (FLONASE) 50 MCG/ACT nasal spray Place 1-2 sprays into both nostrils as needed. 07/06/18   [provider]  lisinopril-hydrochlorothiazide (PRINZIDE,ZESTORETIC) 20-25 MG per tablet Take 1 tablet by mouth daily. 06/11/13   Minus Breeding, MD  metFORMIN (GLUCOPHAGE-XR) 500 MG 24 hr tablet Take 500 mg by mouth 2 (two) times daily. 04/22/15   [provider]  Multiple Vitamin (MULTIVITAMIN) capsule Take 1 capsule by mouth daily.      [provider]  pravastatin (PRAVACHOL) 40 MG tablet Take 40 mg by mouth daily.      [provider]    Family History Family History  Problem Relation Age of Onset  . Colon polyps Other     Social History Social History   Tobacco Use  .  Smoking status: Former Research scientist (life sciences)  . Smokeless tobacco: Never Used  Substance Use Topics  . Alcohol use: No  . Drug use: Not on file     Allergies   Codeine; Sulfonamide derivatives; and Penicillins   Review of Systems Review of Systems  Constitutional: Positive for fever. Negative for chills.  HENT: Negative for rhinorrhea and sore throat.   Eyes: Negative for visual disturbance.  Respiratory: Negative for cough and shortness of breath.   Cardiovascular: Negative for chest pain and leg swelling.  Gastrointestinal: Negative for abdominal pain, diarrhea, nausea and vomiting.  Genitourinary: Positive for  decreased urine volume. Negative for dysuria and hematuria.  Musculoskeletal: Negative for back pain and neck pain.  Skin: Negative for rash.  Neurological: Positive for weakness. Negative for dizziness, light-headedness and headaches.  Hematological: Does not bruise/bleed easily.  Psychiatric/Behavioral: Negative for confusion.     Physical Exam Updated Vital Signs BP (!) 153/71 (BP Location: Right Arm)   Pulse 74   Temp 98 F (36.7 C) (Oral)   Resp 13   SpO2 100%   Physical Exam Vitals signs and nursing note reviewed.  Constitutional:      General: She is not in acute distress.    Appearance: She is well-developed.  HENT:     Head: Normocephalic and atraumatic.     Mouth/Throat:     Mouth: Mucous membranes are moist.  Eyes:     Extraocular Movements: Extraocular movements intact.     Conjunctiva/sclera: Conjunctivae normal.     Pupils: Pupils are equal, round, and reactive to light.  Neck:     Musculoskeletal: Normal range of motion and neck supple.  Cardiovascular:     Rate and Rhythm: Normal rate and regular rhythm.     Heart sounds: Normal heart sounds. No murmur.  Pulmonary:     Effort: Pulmonary effort is normal. No respiratory distress.     Breath sounds: Normal breath sounds.  Abdominal:     General: Bowel sounds are normal.     Palpations: Abdomen is soft.     Tenderness: There is no abdominal tenderness.  Musculoskeletal: Normal range of motion.        General: No swelling.     Right lower leg: No edema.     Left lower leg: No edema.  Skin:    General: Skin is warm and dry.     Capillary Refill: Capillary refill takes less than 2 seconds.  Neurological:     General: No focal deficit present.     Mental Status: She is alert and oriented to person, place, and time.      ED Treatments / Results  Labs (all labs ordered are listed, but only abnormal results are displayed) Labs Reviewed  CBC WITH DIFFERENTIAL/PLATELET - Abnormal; Notable for the  following components:      Result Value   RBC 3.19 (*)    Hemoglobin 9.2 (*)    HCT 29.9 (*)    Abs Immature Granulocytes 0.45 (*)    All other components within normal limits  BASIC METABOLIC PANEL - Abnormal; Notable for the following components:   Potassium 6.6 (*)    CO2 11 (*)    Glucose, Bld 105 (*)    BUN 99 (*)    Creatinine, Ser 12.67 (*)    Calcium 7.3 (*)    GFR calc non Af Amer 3 (*)    GFR calc Af Amer 3 (*)    Anion gap 17 (*)    All  other components within normal limits  HEPATIC FUNCTION PANEL - Abnormal; Notable for the following components:   Total Protein 6.3 (*)    Albumin 3.3 (*)    AST 11 (*)    All other components within normal limits  URINALYSIS, ROUTINE W REFLEX MICROSCOPIC - Abnormal; Notable for the following components:   Glucose, UA 50 (*)    Hgb urine dipstick MODERATE (*)    Protein, ur 100 (*)    Bacteria, UA RARE (*)    All other components within normal limits  GLUCOSE, CAPILLARY - Abnormal; Notable for the following components:   Glucose-Capillary 186 (*)    All other components within normal limits  GLUCOSE, CAPILLARY - Abnormal; Notable for the following components:   Glucose-Capillary 243 (*)    All other components within normal limits  SODIUM, URINE, RANDOM  UREA NITROGEN, URINE  BASIC METABOLIC PANEL  CBC  POTASSIUM  POTASSIUM  CREATININE, URINE, RANDOM  UPEP/UIFE/LIGHT CHAINS/TP, 24-HR UR  PROTEIN ELECTROPHORESIS, SERUM  PROTEIN / CREATININE RATIO, URINE  POTASSIUM  POTASSIUM  POTASSIUM    EKG EKG Interpretation  Date/Time:  Wednesday August 08 2018 16:41:19 EST Ventricular Rate:  77 PR Interval:    QRS Duration: 88 QT Interval:  415 QTC Calculation: 470 R Axis:   70 Text Interpretation:  Sinus rhythm Anteroseptal infarct, age indeterminate No significant change since last tracing Confirmed by Fredia Sorrow [phone]) on 08/08/2018 5:43:21 PM   Radiology US Renal  Result Date: 08/08/2018 CLINICAL DATA:  Renal  failure EXAM: RENAL / URINARY TRACT ULTRASOUND COMPLETE COMPARISON:  None. FINDINGS: Right Kidney: Renal measurements: 10.9 x 4.7 x 5.3 cm = volume: 145 mL. Diffusely increased parenchymal echotexture suggesting chronic medical renal disease. No hydronephrosis. Left Kidney: Renal measurements: 10.8 x 4.9 x 4.5 cm = volume: 127 mL. Small simple appearing cyst in the midpole measuring 1.2 cm maximal diameter. Diffusely increased parenchymal echotexture suggesting chronic medical disease. No hydronephrosis. Bladder: Appears normal for degree of bladder distention. Incidental note of cholelithiasis. IMPRESSION: 1. Increased parenchymal echotexture in the kidney suggesting chronic medical renal disease. No hydronephrosis. 2. Small benign-appearing cyst on the left kidney. 3. Incidental note of cholelithiasis. Electronically Signed   By: Lucienne Capers M.D.   On: 08/08/2018 20:48    Procedures Procedures (including critical care time)  Medications Ordered in ED Medications  aspirin EC tablet 81 mg (has no administration in time range)  amLODipine (NORVASC) tablet 5 mg (has no administration in time range)  pravastatin (PRAVACHOL) tablet 10 mg (has no administration in time range)  heparin injection 5,000 Units (5,000 Units Subcutaneous Given 08/08/18 2229)  sodium chloride flush (NS) 0.9 % injection 3 mL (has no administration in time range)  0.9 %  sodium chloride infusion ( Intravenous New Bag/Given 08/08/18 2237)  acetaminophen (TYLENOL) tablet 650 mg (has no administration in time range)    Or  acetaminophen (TYLENOL) suppository 650 mg (has no administration in time range)  ondansetron (ZOFRAN) tablet 4 mg (has no administration in time range)    Or  ondansetron (ZOFRAN) injection 4 mg (has no administration in time range)  insulin aspart (novoLOG) injection 0-9 Units (has no administration in time range)  insulin aspart (novoLOG) injection 0-5 Units (0 Units Subcutaneous Not Given 08/08/18 2223)    sodium bicarbonate tablet 650 mg (has no administration in time range)  calcium gluconate 1 g in sodium chloride 0.9 % 100 mL IVPB (0 g Intravenous Stopped 08/08/18 2104)  sodium bicarbonate injection 50  mEq (50 mEq Intravenous Given 08/08/18 2229)  insulin aspart (novoLOG) injection 5 Units (5 Units Intravenous Given 08/08/18 2229)    And  dextrose 50 % solution 50 mL (50 mLs Intravenous Given 08/08/18 2229)     Initial Impression / Assessment and Plan / ED Course  I have reviewed the triage vital signs and the nursing notes.  Pertinent labs & imaging results that were available during my care of the patient were reviewed by me and considered in my medical decision making (see chart for details).        Patient will require admission for the renal failure.  Suspect that the hyperkalemia has been gradual because she has no EKG changes or any changes on cardiac monitor.  Discussed with Dr. Augustin Coupe on-call for nephrology.  Hospitalist will admit nephrology will consult.  Patient given calcium gluconate IV piggyback to help protect about the elevated potassium.  Patient's renal function almost 2 years ago was normal.  Not sure when this started to change patient states is been several weeks with decreased urine output.  But still urinating.  Patient stated other than feeling fatigued and weak she was feeling fine.  Patient with significant creatinine of 12.  Potassium as stated above.  Hospitalist will admit.  Final Clinical Impressions(s) / ED Diagnoses   Final diagnoses:  Hyperkalemia  Renal failure, unspecified chronicity    ED Discharge Orders    None       Fredia Sorrow, MD 08/09/18 [phone]

## 2018-08-09 NOTE — Progress Notes (Signed)
Started pt 24 hour urine collection.

## 2018-08-09 NOTE — Progress Notes (Signed)
Patient ID: Sandra Sheppard, female   DOB: 09-25-1945, 73 y.o.   MRN: [id number] S: Feeling better this morning. O:BP (!) 156/63 (BP Location: Right Arm)   Pulse 88   Temp 98.7 F (37.1 C) (Oral)   Resp 16   Ht 5\' 4"  (1.626 m)   Wt 51.7 kg   SpO2 98%   BMI 19.57 kg/m   Intake/Output Summary (Last 24 hours) at 08/09/2018 1209 Last data filed at 08/09/2018 1156 Gross per 24 hour  Intake 920.98 ml  Output 120 ml  Net 800.98 ml   Intake/Output: I/O last 3 completed shifts: In: 921 [I.V.:921] Out: -   Intake/Output this shift:  Total I/O In: -  Out: 120 [Urine:120] Weight change:  Gen: NAD  CVS:no rub Resp: Cta Abd: +BS, soft, NT/ND Ext: no edema  Recent Labs  Lab 08/08/18 1753 08/08/18 2347 08/09/18 0456 08/09/18 0742 08/09/18 1122  NA 136  --  139  --   --   K 6.6* 5.6* 5.4* 5.3* 5.6*  CL 108  --  110  --   --   CO2 11*  --  10*  --   --   GLUCOSE 105*  --  119*  --   --   BUN 99*  --  96*  --   --   CREATININE 12.67*  --  12.18*  --   --   ALBUMIN 3.3*  --   --   --   --   CALCIUM 7.3*  --  7.1*  --   --   AST 11*  --   --   --   --   ALT 6  --   --   --   --    Liver Function Tests: Recent Labs  Lab 08/08/18 1753  AST 11*  ALT 6  ALKPHOS 59  BILITOT 0.4  PROT 6.3*  ALBUMIN 3.3*   No results for input(s): LIPASE, AMYLASE in the last 168 hours. No results for input(s): AMMONIA in the last 168 hours. CBC: Recent Labs  Lab 08/08/18 1753 08/09/18 0456  WBC 8.7 6.9  NEUTROABS 6.2  --   HGB 9.2* 8.0*  HCT 29.9* 24.7*  MCV 93.7 90.8  PLT 166 156   Cardiac Enzymes: No results for input(s): CKTOTAL, CKMB, CKMBINDEX, TROPONINI in the last 168 hours. CBG: Recent Labs  Lab 08/08/18 2241 08/08/18 2352 08/09/18 0644 08/09/18 1131  GLUCAP 186* 243* 103* 128*    Iron Studies: No results for input(s): IRON, TIBC, TRANSFERRIN, FERRITIN in the last 72 hours. Studies/Results: US Renal  Result Date: 08/08/2018 CLINICAL DATA:  Renal failure EXAM: RENAL  / URINARY TRACT ULTRASOUND COMPLETE COMPARISON:  None. FINDINGS: Right Kidney: Renal measurements: 10.9 x 4.7 x 5.3 cm = volume: 145 mL. Diffusely increased parenchymal echotexture suggesting chronic medical renal disease. No hydronephrosis. Left Kidney: Renal measurements: 10.8 x 4.9 x 4.5 cm = volume: 127 mL. Small simple appearing cyst in the midpole measuring 1.2 cm maximal diameter. Diffusely increased parenchymal echotexture suggesting chronic medical disease. No hydronephrosis. Bladder: Appears normal for degree of bladder distention. Incidental note of cholelithiasis. IMPRESSION: 1. Increased parenchymal echotexture in the kidney suggesting chronic medical renal disease. No hydronephrosis. 2. Small benign-appearing cyst on the left kidney. 3. Incidental note of cholelithiasis. Electronically Signed   By: Lucienne Capers M.D.   On: 08/08/2018 20:48   . amLODipine  5 mg Oral Daily  . aspirin EC  81 mg Oral Daily  . heparin  5,000 Units Subcutaneous Q8H  . insulin aspart  0-5 Units Subcutaneous QHS  . insulin aspart  0-9 Units Subcutaneous TID WC  . pravastatin  10 mg Oral q1800  . sodium chloride flush  3 mL Intravenous Q12H    BMET    Component Value Date/Time   NA 139 08/09/2018 0456   K 5.6 (H) 08/09/2018 1122   CL 110 08/09/2018 0456   CO2 10 (L) 08/09/2018 0456   GLUCOSE 119 (H) 08/09/2018 0456   BUN 96 (H) 08/09/2018 0456   CREATININE 12.18 (H) 08/09/2018 0456   CALCIUM 7.1 (L) 08/09/2018 0456   GFRNONAA 3 (L) 08/09/2018 0456   GFRAA 3 (L) 08/09/2018 0456   CBC    Component Value Date/Time   WBC 6.9 08/09/2018 0456   RBC 2.72 (L) 08/09/2018 0456   HGB 8.0 (L) 08/09/2018 0456   HCT 24.7 (L) 08/09/2018 0456   PLT 156 08/09/2018 0456   MCV 90.8 08/09/2018 0456   MCH 29.4 08/09/2018 0456   MCHC 32.4 08/09/2018 0456   RDW 14.6 08/09/2018 0456   LYMPHSABS 1.2 08/08/2018 1753   MONOABS 0.8 08/08/2018 1753   EOSABS 0.1 08/08/2018 1753   BASOSABS 0.1 08/08/2018 1753      Assessment/Plan:  1. 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  She presented with N/V/D after being treated for an ear infection with levoquin.  Labs taken at her PCP's office were abnormal and she was referred to go to the [facility].  Labs upon admission were notable for BUN/Cr of 99/12.67 and CO2 of 11.  She had been taking lisinopril-hct as well as metformin prior to admission.  These agents have been stopped and she was started on iVF's with some mild improvement. 1. DDx includes ischemic ATN in setting of volume depletion with concomitant use of lisinopril-hct and meformin.  Doubt AIN as she was only on levoquin since 3/32/95. 2. Metabolic acidosis- in setting of ARF and metformin use.  Start isotonic bicarb and follow.  Currently without symptoms. 3. Hyperkalemia- due to #1 and #2.  Started isotonic bicarb and will also add veltassa and follow.  4. Anemia- her outpatient Hbg levels had been 11.4 so this is a significant drop from 06/16/18.  Will check spep/upep and guaiac stools. Will also check iron stores and follow 5. HTN- BP elevated.  Cont to hold lisinopril-hct. 6. N/V/D- predated levaquin dose according to pcp's office.  Will need further workup in light of anemia and ARF. 7. AVR 8. 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   Donetta Potts, MD [employer] [phone]

## 2018-08-10 LAB — RENAL FUNCTION PANEL
Albumin: 2.7 g/dL — ABNORMAL LOW (ref 3.5–5.0)
Anion gap: 18 — ABNORMAL HIGH (ref 5–15)
BUN: 92 mg/dL — ABNORMAL HIGH (ref 8–23)
CO2: 16 mmol/L — ABNORMAL LOW (ref 22–32)
Calcium: 6.6 mg/dL — ABNORMAL LOW (ref 8.9–10.3)
Chloride: 104 mmol/L (ref 98–111)
Creatinine, Ser: 11.8 mg/dL — ABNORMAL HIGH (ref 0.44–1.00)
GFR calc Af Amer: 3 mL/min — ABNORMAL LOW (ref >60)
GFR calc non Af Amer: 3 mL/min — ABNORMAL LOW (ref >60)
Glucose, Bld: 120 mg/dL — ABNORMAL HIGH (ref 70–99)
Phosphorus: 6.7 mg/dL — ABNORMAL HIGH (ref 2.5–4.6)
Potassium: 4.7 mmol/L (ref 3.5–5.1)
Sodium: 138 mmol/L (ref 135–145)

## 2018-08-10 LAB — CBC WITH DIFFERENTIAL/PLATELET
Abs Immature Granulocytes: 0.09 10*3/uL — ABNORMAL HIGH (ref 0.00–0.07)
Basophils Absolute: 0 10*3/uL (ref 0.0–0.1)
Basophils Relative: 1 %
Eosinophils Absolute: 0.1 10*3/uL (ref 0.0–0.5)
Eosinophils Relative: 2 %
HCT: 23.1 % — ABNORMAL LOW (ref 36.0–46.0)
Hemoglobin: 7.6 g/dL — ABNORMAL LOW (ref 12.0–15.0)
Immature Granulocytes: 2 %
Lymphocytes Relative: 15 %
Lymphs Abs: 0.9 10*3/uL (ref 0.7–4.0)
MCH: 28.9 pg (ref 26.0–34.0)
MCHC: 32.9 g/dL (ref 30.0–36.0)
MCV: 87.8 fL (ref 80.0–100.0)
Monocytes Absolute: 0.6 10*3/uL (ref 0.1–1.0)
Monocytes Relative: 11 %
Neutro Abs: 4.1 10*3/uL (ref 1.7–7.7)
Neutrophils Relative %: 69 %
Platelets: 135 10*3/uL — ABNORMAL LOW (ref 150–400)
RBC: 2.63 MIL/uL — ABNORMAL LOW (ref 3.87–5.11)
RDW: 14.5 % (ref 11.5–15.5)
WBC: 5.8 10*3/uL (ref 4.0–10.5)
nRBC: 0 % (ref 0.0–0.2)

## 2018-08-10 LAB — GLUCOSE, CAPILLARY
Glucose-Capillary: 105 mg/dL — ABNORMAL HIGH (ref 70–99)
Glucose-Capillary: 107 mg/dL — ABNORMAL HIGH (ref 70–99)
Glucose-Capillary: 193 mg/dL — ABNORMAL HIGH (ref 70–99)
Glucose-Capillary: 125 mg/dL — ABNORMAL HIGH (ref 70–99)

## 2018-08-10 LAB — POTASSIUM
Potassium: 4.6 mmol/L (ref 3.5–5.1)
Potassium: 4.5 mmol/L (ref 3.5–5.1)
Potassium: 4.6 mmol/L (ref 3.5–5.1)
Potassium: 4.1 mmol/L (ref 3.5–5.1)
Potassium: 4.3 mmol/L (ref 3.5–5.1)

## 2018-08-10 LAB — UREA NITROGEN, URINE: Urea Nitrogen, Ur: 286 mg/dL

## 2018-08-10 LAB — FOLATE RBC
Folate, Hemolysate: 306 ng/mL
Folate, RBC: 1205 ng/mL (ref >498)
Hematocrit: 25.4 % — ABNORMAL LOW (ref 34.0–46.6)

## 2018-08-10 LAB — HAPTOGLOBIN: Haptoglobin: 296 mg/dL (ref 42–346)

## 2018-08-10 MED ORDER — FERROUS SULFATE 325 (65 FE) MG PO TABS
325.0000 mg | ORAL_TABLET | Freq: Three times a day (TID) | ORAL | Status: DC
  Administered 2018-08-10 – 2018-08-31 (×60): 325 mg via ORAL
  Filled 2018-08-10 (×59): qty 1

## 2018-08-10 MED ORDER — QUETIAPINE FUMARATE 25 MG PO TABS
25.0000 mg | ORAL_TABLET | Freq: Every evening | ORAL | Status: DC | PRN
  Administered 2018-08-10 – 2018-08-11 (×2): 25 mg via ORAL
  Filled 2018-08-10 (×2): qty 1

## 2018-08-10 MED ORDER — CYANOCOBALAMIN 1000 MCG/ML IJ SOLN
1000.0000 ug | Freq: Every day | INTRAMUSCULAR | Status: AC
  Administered 2018-08-10 – 2018-08-16 (×7): 1000 ug via INTRAMUSCULAR
  Filled 2018-08-10 (×7): qty 1

## 2018-08-10 MED ORDER — POLYETHYLENE GLYCOL 3350 17 G PO PACK
17.0000 g | PACK | Freq: Every day | ORAL | Status: AC
  Administered 2018-08-10 – 2018-08-12 (×3): 17 g via ORAL
  Filled 2018-08-10 (×3): qty 1

## 2018-08-10 MED ORDER — HALOPERIDOL LACTATE 5 MG/ML IJ SOLN: 1.0000 mg | Freq: Four times a day (QID) | INTRAMUSCULAR | Status: DC | PRN

## 2018-08-10 MED ORDER — NICOTINE 21 MG/24HR TD PT24
21.0000 mg | MEDICATED_PATCH | Freq: Every day | TRANSDERMAL | Status: DC
  Administered 2018-08-10 – 2018-08-31 (×22): 21 mg via TRANSDERMAL
  Filled 2018-08-10 (×23): qty 1

## 2018-08-10 NOTE — Evaluation (Signed)
Physical Therapy Evaluation Patient Details Name: Sandra Sheppard MRN: [id number] DOB: 26-May-1946 Today's Date: 08/10/2018   History of Present Illness  Pt is a 73 y.o. F with significant PMH of hypertension, type 2 diabetes mellitus, and chronic kidney disease stage III now presenting for evaluation of abnormal outpatient blood work and found to have an acute kidney injury.  Clinical Impression  Prior to admission, pt is independent and lives alone in a second floor apartment. On PT evaluation, pt presenting with significantly decreased functional mobility secondary to balance impairments, decreased endurance, and safety awareness. Pt requiring mod assist for transfers and min assist for ambulation using a walker. Pt ambulating 60 feet with fatigue towards end of walk. Pt presents as high fall risk based on deficits and decreased gait speed. Highly recommend pt use walker for all mobility and live with family member initially for additional support, as I do not think pt could manage negotiating a flight of steps at this time. Pt has excellent family support and family members willing to accommodate her per pt niece. Pt verbalized understanding. Recommending HHPT at discharge.     Follow Up Recommendations Home health PT;Supervision for mobility/OOB    Equipment Recommendations  Rolling walker with 5" wheels;3in1 (PT)    Recommendations for Other Services OT consult     Precautions / Restrictions Precautions Precautions: Fall Restrictions Weight Bearing Restrictions: No      Mobility  Bed Mobility               General bed mobility comments: OOB in chair  Transfers Overall transfer level: Needs assistance Equipment used: Rolling walker (2 wheeled);None Transfers: Sit to/from Stand Sit to Stand: Mod assist         General transfer comment: Initially attempted standing with no assistive device, but due to heavy posterior lean and subsequently decreased eccentric control back  to sitting, opted to use walker. Visual/verbal cueing for hand placement  Ambulation/Gait Ambulation/Gait assistance: Min assist Gait Distance (Feet): 60 Feet Assistive device: Rolling walker (2 wheeled) Gait Pattern/deviations: Step-through pattern;Decreased stride length Gait velocity: decreased Gait velocity interpretation: <1.31 ft/sec, indicative of household ambulator General Gait Details: Cues for walker proximity. Pt with several mild lateral LOB with distractions, requiring min assist to stabilize  Stairs            Wheelchair Mobility    Modified Rankin (Stroke Patients Only)       Balance Overall balance assessment: Needs assistance Sitting-balance support: Feet supported Sitting balance-Leahy Scale: Fair     Standing balance support: Bilateral upper extremity supported Standing balance-Leahy Scale: Poor                               Pertinent Vitals/Pain Pain Assessment: Faces Faces Pain Scale: Hurts a little bit Pain Location: IV site Pain Descriptors / Indicators: Discomfort Pain Intervention(s): Monitored during session    Home Living Family/patient expects to be discharged to:: Private residence Living Arrangements: Alone Available Help at Discharge: Family;Available PRN/intermittently Type of Home: Apartment Home Access: Stairs to enter   Entrance Stairs-Number of Steps: (flight) Home Layout: Two level Home Equipment: None      Prior Function Level of Independence: Independent               Hand Dominance        Extremity/Trunk Assessment   Upper Extremity Assessment Upper Extremity Assessment: Generalized weakness    Lower Extremity Assessment Lower Extremity  Assessment: Generalized weakness    Cervical / Trunk Assessment Cervical / Trunk Assessment: Normal  Communication   Communication: No difficulties  Cognition Arousal/Alertness: Awake/alert Behavior During Therapy: WFL for tasks  assessed/performed Overall Cognitive Status: Impaired/Different from baseline Area of Impairment: Safety/judgement;Following commands                       Following Commands: Follows multi-step commands inconsistently Safety/Judgement: Decreased awareness of safety;Decreased awareness of deficits            General Comments      Exercises     Assessment/Plan    PT Assessment Patient needs continued PT services  PT Problem List Decreased strength;Decreased activity tolerance;Decreased balance;Decreased mobility;Decreased safety awareness       PT Treatment Interventions DME instruction;Gait training;Stair training;Functional mobility training;Therapeutic activities;Therapeutic exercise;Balance training;Patient/family education    PT Goals (Current goals can be found in the Care Plan section)  Acute Rehab PT Goals Patient Stated Goal: "go home." PT Goal Formulation: With patient Time For Goal Achievement: 08/24/18 Potential to Achieve Goals: Good    Frequency Min 3X/week   Barriers to discharge        Co-evaluation               AM-PAC PT "6 Clicks" Mobility  Outcome Measure Help needed turning from your back to your side while in a flat bed without using bedrails?: None Help needed moving from lying on your back to sitting on the side of a flat bed without using bedrails?: A Little Help needed moving to and from a bed to a chair (including a wheelchair)?: A Little Help needed standing up from a chair using your arms (e.g., wheelchair or bedside chair)?: A Lot Help needed to walk in hospital room?: A Little Help needed climbing 3-5 steps with a railing? : A Lot 6 Click Score: 17    End of Session Equipment Utilized During Treatment: Gait belt Activity Tolerance: Patient tolerated treatment well Patient left: in chair;with call bell/phone within reach;with chair alarm set Nurse Communication: Mobility status PT Visit Diagnosis: Unsteadiness on  feet (R26.81);Difficulty in walking, not elsewhere classified (R26.2)    Time: 6286-3817 PT Time Calculation (min) (ACUTE ONLY): 21 min   Charges:   PT Evaluation $PT Eval Moderate Complexity: 1 Mod        Ellamae Sia, [place], DPT Acute Rehabilitation Services Pager [phone] Office [phone]   Willy Eddy 08/10/2018, 1:29 PM

## 2018-08-10 NOTE — Progress Notes (Signed)
[facility] PROGRESS NOTE    Progress Note  Sandra Sheppard  VHQ:[id number] DOB: 1945/10/27 DOA: 08/08/2018 PCP: Prince Solian, MD     Brief Narrative:   Sandra Sheppard is an 73 y.o. female past medical history significant for hypertension, diabetes mellitus type 2, chronic kidney disease stage III who presents with abnormal labs from the outpatient setting  Assessment/Plan:   AKI (acute kidney injury) ([place]) on   CKD (chronic kidney disease) stage 3, GFR 30-59 ml/min ([place]): Baseline creatinine 1.5-2, in the setting of nausea and vomiting, with recent use of Levaquin for an ear infection with lisinopril and hydrochlorothiazide use at home. Nephrotoxic agents were held on admission. On admission of 12.6, renal ultrasound did not show hydronephrosis. Nephrology was consulted he was started on IV fluid hydration, with sodium bicarb at 125. Her GFR continues to be at 3, she has been an uric. Likely ATN due to prerenal azotemia. SPEP and UPEP are pending.  Hyperkalemia: 6.6 on admission after IV fluid hydration 5.6. He was given Veltassa daily. Her potassium today is 4.7.  We will hold it for today.  Metabolic acidosis: In the setting of acute kidney injury and metformin use. Is improving with bicarbonate IV.  Further management per renal.  Normocytic anemia: Ferritin of 54 hemoglobin of 7.6 MCV of 87. B12 117, will start B12 intramuscular repletion.  Essential hypertension: Hydrochlorothiazide lisinopril have been held. Continue amlodipine 10 mg, blood pressure is fairly controlled.  Diabetes mellitus type II: With an A1c of 6.2. Continue sliding scale insulin.  Hyperlipidemia: Continue pravastatin.  Acute confusional state: Use Seroquel at bedtime Haldol IV as needed. check a 12-lead EKG tomorrow morning.  GI prophylaxis: Due to significant elevated BUN she was started on Protonix empirically.  DVT prophylaxis: lovenox Family Communication:noen Disposition  Plan/Barrier to D/C: unable to determine Code Status:     Code Status Orders  (From admission, onward)         Start     Ordered   08/08/18 2018  Limited resuscitation (code)  Continuous    Question Answer Comment  In the event of cardiac or respiratory ARREST: Initiate Code Blue, Call Rapid Response Yes   In the event of cardiac or respiratory ARREST: Perform CPR Yes   In the event of cardiac or respiratory ARREST: Perform Intubation/Mechanical Ventilation No   In the event of cardiac or respiratory ARREST: Use NIPPV/BiPAp only if indicated Yes   In the event of cardiac or respiratory ARREST: Administer ACLS medications if indicated Yes   In the event of cardiac or respiratory ARREST: Perform Defibrillation or Cardioversion if indicated Yes      08/08/18 2017        Code Status History    Date Active Date Inactive Code Status Order ID Comments User Context   08/08/2018 2004 08/08/2018 2017 Full Code [id number]  Vianne Bulls, MD ED   07/04/2016 2149 07/05/2016 1816 Partial Code [id number]  Lily Kocher, MD ED        IV Access:    Peripheral IV   Procedures and diagnostic studies:   US Renal  Result Date: 08/08/2018 CLINICAL DATA:  Renal failure EXAM: RENAL / URINARY TRACT ULTRASOUND COMPLETE COMPARISON:  None. FINDINGS: Right Kidney: Renal measurements: 10.9 x 4.7 x 5.3 cm = volume: 145 mL. Diffusely increased parenchymal echotexture suggesting chronic medical renal disease. No hydronephrosis. Left Kidney: Renal measurements: 10.8 x 4.9 x 4.5 cm = volume: 127 mL. Small simple appearing cyst in the midpole  measuring 1.2 cm maximal diameter. Diffusely increased parenchymal echotexture suggesting chronic medical disease. No hydronephrosis. Bladder: Appears normal for degree of bladder distention. Incidental note of cholelithiasis. IMPRESSION: 1. Increased parenchymal echotexture in the kidney suggesting chronic medical renal disease. No hydronephrosis. 2. Small benign-appearing  cyst on the left kidney. 3. Incidental note of cholelithiasis. Electronically Signed   By: Lucienne Capers M.D.   On: 08/08/2018 20:48     Medical Consultants:    None.  Anti-Infectives:    Subjective:    Sandra Sheppard is confused today she does not know where she is had, relates that people are coming in in [place] roads inside the room.  Objective:    Vitals:   08/09/18 1849 08/09/18 2003 08/09/18 2210 08/10/18 0336  BP: (!) 152/56 (!) 147/67 (!) 138/56 122/82  Pulse: 77 77 74 76  Resp: 20 20  19   Temp: 98.4 F (36.9 C) 98.1 F (36.7 C)  98 F (36.7 C)  TempSrc: Oral Oral  Oral  SpO2: 97% 97%  95%  Weight:      Height:        Intake/Output Summary (Last 24 hours) at 08/10/2018 0728 Last data filed at 08/10/2018 0526 Gross per 24 hour  Intake 1560.85 ml  Output 230 ml  Net 1330.85 ml   Filed Weights   08/08/18 2151  Weight: 51.7 kg    Exam: General exam: In no acute distress. Respiratory system: Good air movement and clear to auscultation. Cardiovascular system: S1 & S2 heard, RRR.  Gastrointestinal system: Abdomen is nondistended, soft and nontender.  Central nervous system: Alert and oriented. No focal neurological deficits. Extremities: No pedal edema. Skin: No rashes, lesions or ulcers Psychiatry: Mentation is fluctuating in a 24-hour.  She does not know she is in the hospital, she does not know she is in [place] she has to be reoriented several times.   Data Reviewed:    Labs: Basic Metabolic Panel: Recent Labs  Lab 08/08/18 1753  08/09/18 0456  08/09/18 1605  08/10/18 0024 08/10/18 0402  NA 136  --  139  --  138  --   --  138  K 6.6*   < > 5.4*   < > 5.5*  5.5*   < > 4.6 4.7  CL 108  --  110  --  109  --   --  104  CO2 11*  --  10*  --  12*  --   --  16*  GLUCOSE 105*  --  119*  --  165*  --   --  120*  BUN 99*  --  96*  --  94*  --   --  92*  CREATININE 12.67*  --  12.18*  --  12.06*  --   --  11.80*  CALCIUM 7.3*  --  7.1*  --   7.3*  --   --  6.6*  PHOS  --   --   --   --  7.7*  --   --  6.7*   < > = values in this interval not displayed.   GFR Estimated Creatinine Clearance: 3.5 mL/min (A) (by C-G formula based on SCr of 11.8 mg/dL (H)). Liver Function Tests: Recent Labs  Lab 08/08/18 1753 08/09/18 1605 08/10/18 0402  AST 11*  --   --   ALT 6  --   --   ALKPHOS 59  --   --   BILITOT 0.4  --   --  PROT 6.3*  --   --   ALBUMIN 3.3* 3.1* 2.7*   No results for input(s): LIPASE, AMYLASE in the last 168 hours. No results for input(s): AMMONIA in the last 168 hours. Coagulation profile No results for input(s): INR, PROTIME in the last 168 hours.  CBC: Recent Labs  Lab 08/08/18 1753 08/09/18 0456 08/10/18 0402  WBC 8.7 6.9 5.8  NEUTROABS 6.2  --  4.1  HGB 9.2* 8.0* 7.6*  HCT 29.9* 24.7* 23.1*  MCV 93.7 90.8 87.8  PLT 166 156 135*   Cardiac Enzymes: No results for input(s): CKTOTAL, CKMB, CKMBINDEX, TROPONINI in the last 168 hours. BNP (last 3 results) No results for input(s): PROBNP in the last 8760 hours. CBG: Recent Labs  Lab 08/09/18 0644 08/09/18 1131 08/09/18 1733 08/09/18 2128 08/10/18 0720  GLUCAP 103* 128* 130* 91 105*   D-Dimer: No results for input(s): DDIMER in the last 72 hours. Hgb A1c: No results for input(s): HGBA1C in the last 72 hours. Lipid Profile: No results for input(s): CHOL, HDL, LDLCALC, TRIG, CHOLHDL, LDLDIRECT in the last 72 hours. Thyroid function studies: No results for input(s): TSH, T4TOTAL, T3FREE, THYROIDAB in the last 72 hours.  Invalid input(s): FREET3 Anemia work up: Recent Labs    08/09/18 1605  VITAMINB12 117*  FERRITIN 54  TIBC 265  IRON 78   Sepsis Labs: Recent Labs  Lab 08/08/18 1753 08/09/18 0456 08/10/18 0402  WBC 8.7 6.9 5.8   Microbiology No results found for this or any previous visit (from the past 240 hour(s)).   Medications:   . amLODipine  10 mg Oral Daily  . aspirin EC  81 mg Oral Daily  . heparin  5,000 Units  Subcutaneous Q8H  . insulin aspart  0-5 Units Subcutaneous QHS  . insulin aspart  0-9 Units Subcutaneous TID WC  . pantoprazole  40 mg Oral Daily  . pravastatin  10 mg Oral q1800  . sodium chloride flush  3 mL Intravenous Q12H   Continuous Infusions: . sodium chloride Stopped (08/09/18 2346)  .  sodium bicarbonate (isotonic) infusion in sterile water 125 mL/hr at 08/09/18 2348      LOS: 1 day   Charlynne Cousins  [facility]  08/10/2018, 7:28 AM

## 2018-08-10 NOTE — Progress Notes (Signed)
Patient ID: Sandra Sheppard, female   DOB: 02-Jun-1946, 73 y.o.   MRN: [id number] S: Feeling better but still with no appetite O:BP (!) 138/57 (BP Location: Left Arm)   Pulse 69   Temp (!) 97.5 F (36.4 C) (Oral)   Resp 19   Ht 5\' 4"  (1.626 m)   Wt 51.7 kg   SpO2 95%   BMI 19.57 kg/m   Intake/Output Summary (Last 24 hours) at 08/10/2018 1122 Last data filed at 08/10/2018 0820 Gross per 24 hour  Intake 1440.85 ml  Output 190 ml  Net 1250.85 ml   Intake/Output: I/O last 3 completed shifts: In: 2481.8 [P.O.:360; I.V.:2121.8] Out: 230 [Urine:230]  Intake/Output this shift:  Total I/O In: -  Out: 50 [Urine:50] Weight change:  Gen: NAD CVS: no rub Resp: cta Abd: benign Ext: no edema  Recent Labs  Lab 08/08/18 1753  08/09/18 0456 08/09/18 0742 08/09/18 1122 08/09/18 1605 08/09/18 1924 08/10/18 0024 08/10/18 0402 08/10/18 0735  NA 136  --  139  --   --  138  --   --  138  --   K 6.6*   < > 5.4* 5.3* 5.6* 5.5*  5.5* 5.2* 4.6 4.7 4.5  CL 108  --  110  --   --  109  --   --  104  --   CO2 11*  --  10*  --   --  12*  --   --  16*  --   GLUCOSE 105*  --  119*  --   --  165*  --   --  120*  --   BUN 99*  --  96*  --   --  94*  --   --  92*  --   CREATININE 12.67*  --  12.18*  --   --  12.06*  --   --  11.80*  --   ALBUMIN 3.3*  --   --   --   --  3.1*  --   --  2.7*  --   CALCIUM 7.3*  --  7.1*  --   --  7.3*  --   --  6.6*  --   PHOS  --   --   --   --   --  7.7*  --   --  6.7*  --   AST 11*  --   --   --   --   --   --   --   --   --   ALT 6  --   --   --   --   --   --   --   --   --    < > = values in this interval not displayed.   Liver Function Tests: Recent Labs  Lab 08/08/18 1753 08/09/18 1605 08/10/18 0402  AST 11*  --   --   ALT 6  --   --   ALKPHOS 59  --   --   BILITOT 0.4  --   --   PROT 6.3*  --   --   ALBUMIN 3.3* 3.1* 2.7*   No results for input(s): LIPASE, AMYLASE in the last 168 hours. No results for input(s): AMMONIA in the last 168  hours. CBC: Recent Labs  Lab 08/08/18 1753 08/09/18 0456 08/10/18 0402  WBC 8.7 6.9 5.8  NEUTROABS 6.2  --  4.1  HGB 9.2* 8.0* 7.6*  HCT 29.9* 24.7* 23.1*  MCV 93.7 90.8 87.8  PLT 166 156 135*   Cardiac Enzymes: No results for input(s): CKTOTAL, CKMB, CKMBINDEX, TROPONINI in the last 168 hours. CBG: Recent Labs  Lab 08/09/18 1131 08/09/18 1733 08/09/18 2128 08/10/18 0720 08/10/18 1112  GLUCAP 128* 130* 91 105* 107*    Iron Studies:  Recent Labs    08/09/18 1605  IRON 78  TIBC 265  FERRITIN 54   Studies/Results: US Renal  Result Date: 08/08/2018 CLINICAL DATA:  Renal failure EXAM: RENAL / URINARY TRACT ULTRASOUND COMPLETE COMPARISON:  None. FINDINGS: Right Kidney: Renal measurements: 10.9 x 4.7 x 5.3 cm = volume: 145 mL. Diffusely increased parenchymal echotexture suggesting chronic medical renal disease. No hydronephrosis. Left Kidney: Renal measurements: 10.8 x 4.9 x 4.5 cm = volume: 127 mL. Small simple appearing cyst in the midpole measuring 1.2 cm maximal diameter. Diffusely increased parenchymal echotexture suggesting chronic medical disease. No hydronephrosis. Bladder: Appears normal for degree of bladder distention. Incidental note of cholelithiasis. IMPRESSION: 1. Increased parenchymal echotexture in the kidney suggesting chronic medical renal disease. No hydronephrosis. 2. Small benign-appearing cyst on the left kidney. 3. Incidental note of cholelithiasis. Electronically Signed   By: Lucienne Capers M.D.   On: 08/08/2018 20:48   . amLODipine  10 mg Oral Daily  . aspirin EC  81 mg Oral Daily  . cyanocobalamin  1,000 mcg Intramuscular Daily  . ferrous sulfate  325 mg Oral TID WC  . heparin  5,000 Units Subcutaneous Q8H  . insulin aspart  0-5 Units Subcutaneous QHS  . insulin aspart  0-9 Units Subcutaneous TID WC  . nicotine  21 mg Transdermal Daily  . pantoprazole  40 mg Oral Daily  . polyethylene glycol  17 g Oral Daily  . pravastatin  10 mg Oral q1800  .  sodium chloride flush  3 mL Intravenous Q12H    BMET    Component Value Date/Time   NA 138 08/10/2018 0402   K 4.5 08/10/2018 0735   CL 104 08/10/2018 0402   CO2 16 (L) 08/10/2018 0402   GLUCOSE 120 (H) 08/10/2018 0402   BUN 92 (H) 08/10/2018 0402   CREATININE 11.80 (H) 08/10/2018 0402   CALCIUM 6.6 (L) 08/10/2018 0402   GFRNONAA 3 (L) 08/10/2018 0402   GFRAA 3 (L) 08/10/2018 0402   CBC    Component Value Date/Time   WBC 5.8 08/10/2018 0402   RBC 2.63 (L) 08/10/2018 0402   HGB 7.6 (L) 08/10/2018 0402   HCT 23.1 (L) 08/10/2018 0402   PLT 135 (L) 08/10/2018 0402   MCV 87.8 08/10/2018 0402   MCH 28.9 08/10/2018 0402   MCHC 32.9 08/10/2018 0402   RDW 14.5 08/10/2018 0402   LYMPHSABS 0.9 08/10/2018 0402   MONOABS 0.6 08/10/2018 0402   EOSABS 0.1 08/10/2018 0402   BASOSABS 0.0 08/10/2018 0402     Assessment/Plan:  1. 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  She presented with N/V/D after being treated for an ear infection with levoquin.  Labs taken at her PCP's office were abnormal and she was referred to go to the [facility].  Labs upon admission were notable for BUN/Cr of 99/12.67 and CO2 of 11.  She had been taking lisinopril-hct as well as metformin prior to admission.  These agents have been stopped and she was started on iVF's with some mild improvement. 1. DDx includes ischemic ATN in setting of volume depletion with concomitant use of lisinopril-hct and meformin.  Doubt AIN as she was only on levoquin since 08/06/18. 2. Scr slowly improving.  Continue to follow and cont with IVF's. 2. Metabolic acidosis- in setting of ARF and metformin use.  Started isotonic bicarb 08/09/18 with some improvement of CO2.  Continue with current rate for now and follow.  Currently without symptoms. 3. Hyperkalemia- due to #1 and #2.  improved with bicarb and binders 4. Anemia- her outpatient Hbg levels had been 11.4 so this is a significant  drop from 06/16/18.  Will check spep/upep and guaiac stools. Will also check iron stores and follow but may need blood transfusion if continues to drop. 5. HTN- BP elevated.  Cont to hold lisinopril-hct. 6. N/V/D- predated levaquin dose according to pcp's office.  Will need further workup in light of anemia and ARF. 7. AVR 8. 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  Donetta Potts, MD [employer] [phone]

## 2018-08-10 NOTE — Progress Notes (Signed)
Bladder scan performed after patient voided 50cc. Scan revealed only 39 cc in the bladder. Will continue to monitor. Maritssa Haughton, Wonda Cheng, Therapist, sports

## 2018-08-11 LAB — POTASSIUM: Potassium: 4.1 mmol/L (ref 3.5–5.1)

## 2018-08-11 LAB — RENAL FUNCTION PANEL
Albumin: 2.5 g/dL — ABNORMAL LOW (ref 3.5–5.0)
Anion gap: 20 — ABNORMAL HIGH (ref 5–15)
BUN: 91 mg/dL — ABNORMAL HIGH (ref 8–23)
CO2: 21 mmol/L — ABNORMAL LOW (ref 22–32)
Calcium: 6.3 mg/dL — CL (ref 8.9–10.3)
Chloride: 97 mmol/L — ABNORMAL LOW (ref 98–111)
Creatinine, Ser: 11.76 mg/dL — ABNORMAL HIGH (ref 0.44–1.00)
GFR calc Af Amer: 3 mL/min — ABNORMAL LOW (ref >60)
GFR calc non Af Amer: 3 mL/min — ABNORMAL LOW (ref >60)
Glucose, Bld: 109 mg/dL — ABNORMAL HIGH (ref 70–99)
Phosphorus: 6.2 mg/dL — ABNORMAL HIGH (ref 2.5–4.6)
Potassium: 4.2 mmol/L (ref 3.5–5.1)
Sodium: 138 mmol/L (ref 135–145)

## 2018-08-11 LAB — GLUCOSE, CAPILLARY
Glucose-Capillary: 115 mg/dL — ABNORMAL HIGH (ref 70–99)
Glucose-Capillary: 114 mg/dL — ABNORMAL HIGH (ref 70–99)
Glucose-Capillary: 151 mg/dL — ABNORMAL HIGH (ref 70–99)
Glucose-Capillary: 129 mg/dL — ABNORMAL HIGH (ref 70–99)

## 2018-08-11 NOTE — Progress Notes (Signed)
Physical Therapy Treatment Patient Details Name: Sandra Sheppard MRN: [id number] DOB: June 09, 1946 Today's Date: 08/11/2018    History of Present Illness Pt is a 73 y.o. F with significant PMH of hypertension, type 2 diabetes mellitus, and chronic kidney disease stage III now presenting for evaluation of abnormal outpatient blood work and found to have an acute kidney injury.    PT Comments    Pt in chair with family members present. Pt agreeable to gait training and transfer training. Pt tolerated treatment well. Pt required vc/tc for hand placement for sit to/from stand to avoid pulling on walker. Pt becomes easily distracted by things in the hospital environment and experiences several small LOB's when turning head to observe. Pt requires min A to right LOB. Pt encouraged to spread feet apart as she displays a narrow BOS upon commencement of gait training. Pt able to take larger steps when cued. Based on current progress d/c plan to HHPT is appropriate. Plan to progress pt gait training and therex activities as tolerated. Pt left in bed with alarm set, call bell/phone, and all other needs within reach and family members returning to room.   Follow Up Recommendations  Home health PT;Supervision for mobility/OOB     Equipment Recommendations  Rolling walker with 5" wheels;3in1 (PT)    Recommendations for Other Services OT consult     Precautions / Restrictions Precautions Precautions: Fall Restrictions Weight Bearing Restrictions: No    Mobility  Bed Mobility Overal bed mobility: Needs Assistance             General bed mobility comments: OOB in chair  Transfers Overall transfer level: Needs assistance Equipment used: Rolling walker (2 wheeled) Transfers: Sit to/from Stand Sit to Stand: Mod assist         General transfer comment: Slow to rise and mod A for sit to stand. VC for hand placement on EOB when sit from stand. Slow to ready and process transitional instructions.  Pt required mod A to correct initial LOB.   Ambulation/Gait Ambulation/Gait assistance: Min assist;+2 safety/equipment Gait Distance (Feet): 120 Feet Assistive device: Rolling walker (2 wheeled) Gait Pattern/deviations: Step-through pattern;Decreased stride length Gait velocity: decreased   General Gait Details: Cues for walker proximity. Pt with several mild lateral LOB with distractions, requiring min assist to stabilize.   Stairs             Wheelchair Mobility    Modified Rankin (Stroke Patients Only)       Balance Overall balance assessment: Needs assistance Sitting-balance support: Feet supported Sitting balance-Leahy Scale: Fair     Standing balance support: Bilateral upper extremity supported Standing balance-Leahy Scale: Poor                              Cognition Arousal/Alertness: Awake/alert Behavior During Therapy: WFL for tasks assessed/performed Overall Cognitive Status: Impaired/Different from baseline Area of Impairment: Safety/judgement;Following commands                       Following Commands: Follows multi-step commands inconsistently Safety/Judgement: Decreased awareness of safety;Decreased awareness of deficits            Exercises      General Comments        Pertinent Vitals/Pain Faces Pain Scale: No hurt    Home Living  Prior Function            PT Goals (current goals can now be found in the care plan section) Acute Rehab PT Goals Patient Stated Goal: to get into bed PT Goal Formulation: With patient Potential to Achieve Goals: Good    Frequency    Min 3X/week      PT Plan      Co-evaluation              AM-PAC PT "6 Clicks" Mobility   Outcome Measure  Help needed turning from your back to your side while in a flat bed without using bedrails?: None Help needed moving from lying on your back to sitting on the side of a flat bed without using  bedrails?: A Little Help needed moving to and from a bed to a chair (including a wheelchair)?: A Little Help needed standing up from a chair using your arms (e.g., wheelchair or bedside chair)?: A Lot Help needed to walk in hospital room?: A Little Help needed climbing 3-5 steps with a railing? : A Lot 6 Click Score: 17    End of Session Equipment Utilized During Treatment: Gait belt Activity Tolerance: Patient tolerated treatment well Patient left: with call bell/phone within reach;in bed;with bed alarm set;with family/visitor present Nurse Communication: Mobility status PT Visit Diagnosis: Unsteadiness on feet (R26.81);Difficulty in walking, not elsewhere classified (R26.2)     Time: 9507-2257 PT Time Calculation (min) (ACUTE ONLY): 32 min  Charges:  $Gait Training: 8-22 mins $Therapeutic Activity: 8-22 mins                     Maryelizabeth Kaufmann, SPTA   Maryelizabeth Kaufmann 08/11/2018, 5:02 PM

## 2018-08-11 NOTE — Progress Notes (Signed)
Patient ID: Sandra Sheppard, female   DOB: 08-25-1945, 73 y.o.   MRN: [id number] S: No new complaints O:BP 127/62 (BP Location: Left Arm)   Pulse 69   Temp 98.4 F (36.9 C) (Oral)   Resp 18   Ht 5\' 4"  (1.626 m)   Wt 51.7 kg   SpO2 95%   BMI 19.57 kg/m   Intake/Output Summary (Last 24 hours) at 08/11/2018 1408 Last data filed at 08/11/2018 1319 Gross per 24 hour  Intake 1859.44 ml  Output 750 ml  Net 1109.44 ml   Intake/Output: I/O last 3 completed shifts: In: 2245.8 [P.O.:240; I.V.:2005.8] Out: 650 [Urine:650]  Intake/Output this shift:  Total I/O In: 300 [P.O.:300] Out: 200 [Urine:200] Weight change:  Gen: NAD CVS: no rub Resp: cta Abd: benign Ext: no edema  Recent Labs  Lab 08/08/18 1753  08/09/18 0456  08/09/18 1605  08/10/18 0402 08/10/18 0735 08/10/18 1211 08/10/18 1628 08/10/18 2001 08/11/18 0023 08/11/18 0355  NA 136  --  139  --  138  --  138  --   --   --   --   --  138  K 6.6*   < > 5.4*   < > 5.5*  5.5*   < > 4.7 4.5 4.6 4.1 4.3 4.1 4.2  CL 108  --  110  --  109  --  104  --   --   --   --   --  97*  CO2 11*  --  10*  --  12*  --  16*  --   --   --   --   --  21*  GLUCOSE 105*  --  119*  --  165*  --  120*  --   --   --   --   --  109*  BUN 99*  --  96*  --  94*  --  92*  --   --   --   --   --  91*  CREATININE 12.67*  --  12.18*  --  12.06*  --  11.80*  --   --   --   --   --  11.76*  ALBUMIN 3.3*  --   --   --  3.1*  --  2.7*  --   --   --   --   --  2.5*  CALCIUM 7.3*  --  7.1*  --  7.3*  --  6.6*  --   --   --   --   --  6.3*  PHOS  --   --   --   --  7.7*  --  6.7*  --   --   --   --   --  6.2*  AST 11*  --   --   --   --   --   --   --   --   --   --   --   --   ALT 6  --   --   --   --   --   --   --   --   --   --   --   --    < > = values in this interval not displayed.   Liver Function Tests: Recent Labs  Lab 08/08/18 1753 08/09/18 1605 08/10/18 0402 08/11/18 0355  AST 11*  --   --   --   ALT 6  --   --   --  ALKPHOS 59  --   --    --   BILITOT 0.4  --   --   --   PROT 6.3*  --   --   --   ALBUMIN 3.3* 3.1* 2.7* 2.5*   No results for input(s): LIPASE, AMYLASE in the last 168 hours. No results for input(s): AMMONIA in the last 168 hours. CBC: Recent Labs  Lab 08/08/18 1753 08/09/18 0456 08/09/18 1248 08/10/18 0402  WBC 8.7 6.9  --  5.8  NEUTROABS 6.2  --   --  4.1  HGB 9.2* 8.0*  --  7.6*  HCT 29.9* 24.7* 25.4* 23.1*  MCV 93.7 90.8  --  87.8  PLT 166 156  --  135*   Cardiac Enzymes: No results for input(s): CKTOTAL, CKMB, CKMBINDEX, TROPONINI in the last 168 hours. CBG: Recent Labs  Lab 08/10/18 1112 08/10/18 1656 08/10/18 2101 08/11/18 0747 08/11/18 1147  GLUCAP 107* 193* 125* 115* 114*    Iron Studies:  Recent Labs    08/09/18 1605  IRON 78  TIBC 265  FERRITIN 54   Studies/Results: No results found. Marland Kitchen amLODipine  10 mg Oral Daily  . aspirin EC  81 mg Oral Daily  . cyanocobalamin  1,000 mcg Intramuscular Daily  . ferrous sulfate  325 mg Oral TID WC  . heparin  5,000 Units Subcutaneous Q8H  . insulin aspart  0-5 Units Subcutaneous QHS  . insulin aspart  0-9 Units Subcutaneous TID WC  . nicotine  21 mg Transdermal Daily  . pantoprazole  40 mg Oral Daily  . polyethylene glycol  17 g Oral Daily  . pravastatin  10 mg Oral q1800  . sodium chloride flush  3 mL Intravenous Q12H    BMET    Component Value Date/Time   NA 138 08/11/2018 0355   K 4.2 08/11/2018 0355   CL 97 (L) 08/11/2018 0355   CO2 21 (L) 08/11/2018 0355   GLUCOSE 109 (H) 08/11/2018 0355   BUN 91 (H) 08/11/2018 0355   CREATININE 11.76 (H) 08/11/2018 0355   CALCIUM 6.3 (LL) 08/11/2018 0355   GFRNONAA 3 (L) 08/11/2018 0355   GFRAA 3 (L) 08/11/2018 0355   CBC    Component Value Date/Time   WBC 5.8 08/10/2018 0402   RBC 2.63 (L) 08/10/2018 0402   HGB 7.6 (L) 08/10/2018 0402   HCT 23.1 (L) 08/10/2018 0402   HCT 25.4 (L) 08/09/2018 1248   PLT 135 (L) 08/10/2018 0402   MCV 87.8 08/10/2018 0402   MCH 28.9  08/10/2018 0402   MCHC 32.9 08/10/2018 0402   RDW 14.5 08/10/2018 0402   LYMPHSABS 0.9 08/10/2018 0402   MONOABS 0.6 08/10/2018 0402   EOSABS 0.1 08/10/2018 0402   BASOSABS 0.0 08/10/2018 0402    Assessment/Plan:  1. 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 She presented with N/V/D after being treated for an ear infection with levoquin. Labs taken at her PCP's office were abnormal and she was referred to go to the [facility]. Labs upon admission were notable for BUN/Cr of 99/12.67 and CO2 of 11. She had been taking lisinopril-hct as well as metformin prior to admission. These agents have been stopped and she was started on iVF's with some mild improvement. 1. DDx includes ischemic ATN in setting of volume depletion with concomitant use of lisinopril-hct and meformin. Doubt AIN as she was only on levoquin since 08/06/18. 2. Scr slowly improving.  Continue to  follow and cont with IVF's. 3. Not yet stable for discharge as her renal function has not significantly improved. 2. Metabolic acidosis- in setting of ARF and metformin use. Started isotonic bicarb 08/09/18 with some improvement of CO2.   1. Continue with current rate for now and will change to NS tomorrow once bicarb is normalized. 2. Currently without symptoms. 3. Hyperkalemia- due to #1 and #2. improved with bicarb and binders 4. Anemia- her outpatient Hbg levels had been 11.4 so this is a significant drop from 06/16/18. Will check spep/upep and guaiac stools. Will also check iron stores and follow but may need blood transfusion if continues to drop. 5. HTN- BP elevated. Cont to hold lisinopril-hct. 6. B12 deficiency- replete per primary. 7. N/V/D- predated levaquin dose according to pcp's office. Will need further workup in light of anemia and ARF. 8. AVR 9. 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  Donetta Potts,  MD [employer] [phone]

## 2018-08-11 NOTE — Progress Notes (Signed)
[facility] PROGRESS NOTE    Progress Note  ELISSA GRIESHOP  OYD:[id number] DOB: 11/23/1945 DOA: 08/08/2018 PCP: Prince Solian, MD     Brief Narrative:   Sandra Sheppard is an 73 y.o. female past medical history significant for hypertension, diabetes mellitus type 2, chronic kidney disease stage III who presents with abnormal labs from the outpatient setting  Assessment/Plan:   AKI (acute kidney injury) ([place]) on   CKD (chronic kidney disease) stage 3, GFR 30-59 ml/min ([place]): Baseline creatinine 1.5-2, in the setting of nausea and vomiting. On admission of 12.6, renal ultrasound did not show hydronephrosis. Nephrology was consulted he was started on IV fluid hydration, with sodium bicarb. Her GFR continues to be at 3.  Her urine output has improved she is now oliguric. UPEP shows no M spike. Appreciate nephrology's assistance. Concern about AIN, in the setting of ACE inhibitor diuretic use and Levaquin.  Hyperkalemia: 6.6 on admission after IV fluid hydration 5.6. He was given Veltassa daily. Her potassium today is 4.7.   Cont. in the monitor.  Metabolic acidosis: In the setting of acute kidney injury and metformin use. Follow up with IV potassium supplementation.  Normocytic anemia: Ferritin of 54 hemoglobin of 7.6 MCV of 87. B12 117, will start B12 intramuscular repletion.  Essential hypertension: Hydrochlorothiazide lisinopril have been held. Continue amlodipine 10 mg, blood pressure is fairly controlled.  Diabetes mellitus type II: With an A1c of 6.2. Continue sliding scale insulin.  Hyperlipidemia: Continue pravastatin.  Acute confusional state: Use Seroquel at bedtime Haldol IV as needed. check a 12-lead EKG tomorrow morning.  GI prophylaxis: Due to significant elevated BUN she was started on Protonix empirically.  DVT prophylaxis: lovenox Family Communication:noen Disposition Plan/Barrier to D/C: unable to determine Code Status:     Code  Status Orders  (From admission, onward)         Start     Ordered   08/08/18 2018  Limited resuscitation (code)  Continuous    Question Answer Comment  In the event of cardiac or respiratory ARREST: Initiate Code Blue, Call Rapid Response Yes   In the event of cardiac or respiratory ARREST: Perform CPR Yes   In the event of cardiac or respiratory ARREST: Perform Intubation/Mechanical Ventilation No   In the event of cardiac or respiratory ARREST: Use NIPPV/BiPAp only if indicated Yes   In the event of cardiac or respiratory ARREST: Administer ACLS medications if indicated Yes   In the event of cardiac or respiratory ARREST: Perform Defibrillation or Cardioversion if indicated Yes      08/08/18 2017        Code Status History    Date Active Date Inactive Code Status Order ID Comments User Context   08/08/2018 2004 08/08/2018 2017 Full Code [id number]  Vianne Bulls, MD ED   07/04/2016 2149 07/05/2016 1816 Partial Code [id number]  Lily Kocher, MD ED        IV Access:    Peripheral IV   Procedures and diagnostic studies:   No results found.   Medical Consultants:    None.  Anti-Infectives:    Subjective:    [name] fusion of overnight, as per family members her delirium is resolving.  Objective:    Vitals:   08/10/18 1812 08/10/18 2058 08/11/18 0359 08/11/18 0919  BP: (!) 116/53 (!) 126/57 (!) 130/56 127/62  Pulse: 77 74 72 69  Resp: 17 19 20 18   Temp: 98 F (36.7 C) 98.1 F (36.7 C) 98.4  F (36.9 C) 98.4 F (36.9 C)  TempSrc: Oral Oral Oral Oral  SpO2: 96% 92% 94% 95%  Weight:      Height:        Intake/Output Summary (Last 24 hours) at 08/11/2018 0954 Last data filed at 08/11/2018 0942 Gross per 24 hour  Intake 1559.44 ml  Output 650 ml  Net 909.44 ml   Filed Weights   08/08/18 2151  Weight: 51.7 kg    Exam: General exam: In no acute distress. Respiratory system: Good air movement and clear to auscultation. Cardiovascular system:  S1 & S2 heard, RRR.  Gastrointestinal system: Abdomen is nondistended, soft and nontender.  Central nervous system: Alert and oriented. No focal neurological deficits. Extremities: No pedal edema. Skin: No rashes, lesions or ulcers Psychiatry: Judgment and insight are normal.   Data Reviewed:    Labs: Basic Metabolic Panel: Recent Labs  Lab 08/08/18 1753  08/09/18 0456  08/09/18 1605  08/10/18 0402  08/11/18 0023 08/11/18 0355  NA 136  --  139  --  138  --  138  --   --  138  K 6.6*   < > 5.4*   < > 5.5*  5.5*   < > 4.7   < > 4.1 4.2  CL 108  --  110  --  109  --  104  --   --  97*  CO2 11*  --  10*  --  12*  --  16*  --   --  21*  GLUCOSE 105*  --  119*  --  165*  --  120*  --   --  109*  BUN 99*  --  96*  --  94*  --  92*  --   --  91*  CREATININE 12.67*  --  12.18*  --  12.06*  --  11.80*  --   --  11.76*  CALCIUM 7.3*  --  7.1*  --  7.3*  --  6.6*  --   --  6.3*  PHOS  --   --   --   --  7.7*  --  6.7*  --   --  6.2*   < > = values in this interval not displayed.   GFR Estimated Creatinine Clearance: 3.5 mL/min (A) (by C-G formula based on SCr of 11.76 mg/dL (H)). Liver Function Tests: Recent Labs  Lab 08/08/18 1753 08/09/18 1605 08/10/18 0402 08/11/18 0355  AST 11*  --   --   --   ALT 6  --   --   --   ALKPHOS 59  --   --   --   BILITOT 0.4  --   --   --   PROT 6.3*  --   --   --   ALBUMIN 3.3* 3.1* 2.7* 2.5*   No results for input(s): LIPASE, AMYLASE in the last 168 hours. No results for input(s): AMMONIA in the last 168 hours. Coagulation profile No results for input(s): INR, PROTIME in the last 168 hours.  CBC: Recent Labs  Lab 08/08/18 1753 08/09/18 0456 08/09/18 1248 08/10/18 0402  WBC 8.7 6.9  --  5.8  NEUTROABS 6.2  --   --  4.1  HGB 9.2* 8.0*  --  7.6*  HCT 29.9* 24.7* 25.4* 23.1*  MCV 93.7 90.8  --  87.8  PLT 166 156  --  135*   Cardiac Enzymes: No results for input(s): CKTOTAL, CKMB, CKMBINDEX, TROPONINI in the last  168 hours. BNP  (last 3 results) No results for input(s): PROBNP in the last 8760 hours. CBG: Recent Labs  Lab 08/10/18 0720 08/10/18 1112 08/10/18 1656 08/10/18 2101 08/11/18 0747  GLUCAP 105* 107* 193* 125* 115*   D-Dimer: No results for input(s): DDIMER in the last 72 hours. Hgb A1c: No results for input(s): HGBA1C in the last 72 hours. Lipid Profile: No results for input(s): CHOL, HDL, LDLCALC, TRIG, CHOLHDL, LDLDIRECT in the last 72 hours. Thyroid function studies: No results for input(s): TSH, T4TOTAL, T3FREE, THYROIDAB in the last 72 hours.  Invalid input(s): FREET3 Anemia work up: Recent Labs    08/09/18 1605  VITAMINB12 117*  FERRITIN 54  TIBC 265  IRON 78   Sepsis Labs: Recent Labs  Lab 08/08/18 1753 08/09/18 0456 08/10/18 0402  WBC 8.7 6.9 5.8   Microbiology No results found for this or any previous visit (from the past 240 hour(s)).   Medications:   . amLODipine  10 mg Oral Daily  . aspirin EC  81 mg Oral Daily  . cyanocobalamin  1,000 mcg Intramuscular Daily  . ferrous sulfate  325 mg Oral TID WC  . heparin  5,000 Units Subcutaneous Q8H  . insulin aspart  0-5 Units Subcutaneous QHS  . insulin aspart  0-9 Units Subcutaneous TID WC  . nicotine  21 mg Transdermal Daily  . pantoprazole  40 mg Oral Daily  . polyethylene glycol  17 g Oral Daily  . pravastatin  10 mg Oral q1800  . sodium chloride flush  3 mL Intravenous Q12H   Continuous Infusions: . sodium chloride Stopped (08/09/18 2346)  .  sodium bicarbonate (isotonic) infusion in sterile water 125 mL/hr at 08/11/18 0100      LOS: 2 days   Charlynne Cousins  [facility]  08/11/2018, 9:54 AM

## 2018-08-12 LAB — RENAL FUNCTION PANEL
Albumin: 2.4 g/dL — ABNORMAL LOW (ref 3.5–5.0)
Anion gap: 20 — ABNORMAL HIGH (ref 5–15)
BUN: 86 mg/dL — ABNORMAL HIGH (ref 8–23)
CO2: 30 mmol/L (ref 22–32)
Calcium: 5.6 mg/dL — CL (ref 8.9–10.3)
Chloride: 88 mmol/L — ABNORMAL LOW (ref 98–111)
Creatinine, Ser: 11.27 mg/dL — ABNORMAL HIGH (ref 0.44–1.00)
GFR calc Af Amer: 3 mL/min — ABNORMAL LOW (ref >60)
GFR calc non Af Amer: 3 mL/min — ABNORMAL LOW (ref >60)
Glucose, Bld: 108 mg/dL — ABNORMAL HIGH (ref 70–99)
Phosphorus: 5.7 mg/dL — ABNORMAL HIGH (ref 2.5–4.6)
Potassium: 4.1 mmol/L (ref 3.5–5.1)
Sodium: 138 mmol/L (ref 135–145)

## 2018-08-12 LAB — GLUCOSE, CAPILLARY
Glucose-Capillary: 107 mg/dL — ABNORMAL HIGH (ref 70–99)
Glucose-Capillary: 101 mg/dL — ABNORMAL HIGH (ref 70–99)
Glucose-Capillary: 104 mg/dL — ABNORMAL HIGH (ref 70–99)
Glucose-Capillary: 116 mg/dL — ABNORMAL HIGH (ref 70–99)

## 2018-08-12 MED ORDER — CALCIUM CARBONATE 1250 (500 CA) MG PO TABS
500.0000 mg | ORAL_TABLET | Freq: Three times a day (TID) | ORAL | Status: AC
  Administered 2018-08-12 (×3): 500 mg via ORAL
  Filled 2018-08-12 (×3): qty 1

## 2018-08-12 MED ORDER — SODIUM CHLORIDE 0.9 % IV SOLN
INTRAVENOUS | Status: DC
  Administered 2018-08-12 – 2018-08-15 (×6): via INTRAVENOUS

## 2018-08-12 NOTE — Progress Notes (Signed)
[facility] PROGRESS NOTE    Progress Note  DEZARAI Sheppard  QVZ:[id number] DOB: April 09, 1946 DOA: 08/08/2018 PCP: Prince Solian, MD     Brief Narrative:   Sandra Sheppard is an 73 y.o. female past medical history significant for hypertension, diabetes mellitus type 2, chronic kidney disease stage III who presents with abnormal labs from the outpatient setting  Assessment/Plan:   AKI (acute kidney injury) ([place]) on   CKD (chronic kidney disease) stage 3, GFR 30-59 ml/min ([place]): Baseline creatinine 1.5-2. On admission of 12.6, renal ultrasound did not show hydronephrosis. Nephrology was consulted. Her GFR continues to be 3, her urine output is about the same remains oliguric. UPEP shows no M spike. Appreciate nephrology's assistance. Concern about ATN,  in the setting of ACE inhibitor diuretic. Her bicarb is increasing today is 30, will discuss with renal disease week could adjust her to normal saline.  Hyperkalemia: The setting of acute kidney injury and ACE inhibitor now improved. Her potassium today is 4.1.   Cont. in the monitor.  Metabolic acidosis: In the setting of acute kidney injury and metformin use. Bicarbonate today is 30.  Normocytic anemia: Ferritin of 54 hemoglobin of 7.6 MCV of 87. B12 117, will start B12 intramuscular repletion.  Essential hypertension: Hydrochlorothiazide lisinopril have been held. Continue amlodipine 10 mg, blood pressure is fairly controlled.  Diabetes mellitus type II: With an A1c of 6.2. Continue sliding scale insulin.  Hyperlipidemia: Continue pravastatin.  Acute confusional state: Use Seroquel at bedtime Haldol IV as needed.  GI prophylaxis: Due to significant elevated BUN she was started on Protonix empirically.  DVT prophylaxis: lovenox Family Communication:noen Disposition Plan/Barrier to D/C: unable to determine Code Status:     Code Status Orders  (From admission, onward)         Start     Ordered   08/08/18 2018  Limited resuscitation (code)  Continuous    Question Answer Comment  In the event of cardiac or respiratory ARREST: Initiate Code Blue, Call Rapid Response Yes   In the event of cardiac or respiratory ARREST: Perform CPR Yes   In the event of cardiac or respiratory ARREST: Perform Intubation/Mechanical Ventilation No   In the event of cardiac or respiratory ARREST: Use NIPPV/BiPAp only if indicated Yes   In the event of cardiac or respiratory ARREST: Administer ACLS medications if indicated Yes   In the event of cardiac or respiratory ARREST: Perform Defibrillation or Cardioversion if indicated Yes      08/08/18 2017        Code Status History    Date Active Date Inactive Code Status Order ID Comments User Context   08/08/2018 2004 08/08/2018 2017 Full Code [id number]  Vianne Bulls, MD ED   07/04/2016 2149 07/05/2016 1816 Partial Code [id number]  Lily Kocher, MD ED        IV Access:    Peripheral IV   Procedures and diagnostic studies:   No results found.   Medical Consultants:    None.  Anti-Infectives:    Subjective:    [name] no confusions overnight.  Objective:    Vitals:   08/11/18 1809 08/11/18 2021 08/11/18 2027 08/12/18 0436  BP: 128/73 128/71  97/87  Pulse: 78 74 72 78  Resp: 18 15  17   Temp: 97.8 F (36.6 C) 98.5 F (36.9 C)  98 F (36.7 C)  TempSrc: Oral Oral    SpO2: 95% (!) 86% 94% 100%  Weight:  Height:        Intake/Output Summary (Last 24 hours) at 08/12/2018 0837 Last data filed at 08/12/2018 0600 Gross per 24 hour  Intake 2782.14 ml  Output 450 ml  Net 2332.14 ml   Filed Weights   08/08/18 2151  Weight: 51.7 kg    Exam: General exam: In no acute distress. Respiratory system: Good air movement and clear to auscultation. Cardiovascular system: S1 & S2 heard, RRR.  Gastrointestinal system: Abdomen is nondistended, soft and nontender.  Central nervous system: Alert and oriented. No focal neurological  deficits. Extremities: No pedal edema. Skin: No rashes, lesions or ulcers Psychiatry: Judgment and insight are normal.   Data Reviewed:    Labs: Basic Metabolic Panel: Recent Labs  Lab 08/09/18 0456  08/09/18 1605  08/10/18 0402  08/11/18 0355 08/12/18 0604  NA 139  --  138  --  138  --  138 138  K 5.4*   < > 5.5*  5.5*   < > 4.7   < > 4.2 4.1  CL 110  --  109  --  104  --  97* 88*  CO2 10*  --  12*  --  16*  --  21* 30  GLUCOSE 119*  --  165*  --  120*  --  109* 108*  BUN 96*  --  94*  --  92*  --  91* 86*  CREATININE 12.18*  --  12.06*  --  11.80*  --  11.76* 11.27*  CALCIUM 7.1*  --  7.3*  --  6.6*  --  6.3* 5.6*  PHOS  --   --  7.7*  --  6.7*  --  6.2* 5.7*   < > = values in this interval not displayed.   GFR Estimated Creatinine Clearance: 3.6 mL/min (A) (by C-G formula based on SCr of 11.27 mg/dL (H)). Liver Function Tests: Recent Labs  Lab 08/08/18 1753 08/09/18 1605 08/10/18 0402 08/11/18 0355 08/12/18 0604  AST 11*  --   --   --   --   ALT 6  --   --   --   --   ALKPHOS 59  --   --   --   --   BILITOT 0.4  --   --   --   --   PROT 6.3*  --   --   --   --   ALBUMIN 3.3* 3.1* 2.7* 2.5* 2.4*   No results for input(s): LIPASE, AMYLASE in the last 168 hours. No results for input(s): AMMONIA in the last 168 hours. Coagulation profile No results for input(s): INR, PROTIME in the last 168 hours.  CBC: Recent Labs  Lab 08/08/18 1753 08/09/18 0456 08/09/18 1248 08/10/18 0402  WBC 8.7 6.9  --  5.8  NEUTROABS 6.2  --   --  4.1  HGB 9.2* 8.0*  --  7.6*  HCT 29.9* 24.7* 25.4* 23.1*  MCV 93.7 90.8  --  87.8  PLT 166 156  --  135*   Cardiac Enzymes: No results for input(s): CKTOTAL, CKMB, CKMBINDEX, TROPONINI in the last 168 hours. BNP (last 3 results) No results for input(s): PROBNP in the last 8760 hours. CBG: Recent Labs  Lab 08/10/18 2101 08/11/18 0747 08/11/18 1147 08/11/18 1639 08/11/18 2021  GLUCAP 125* 115* 114* 151* 129*   D-Dimer: No  results for input(s): DDIMER in the last 72 hours. Hgb A1c: No results for input(s): HGBA1C in the last 72 hours. Lipid Profile:  No results for input(s): CHOL, HDL, LDLCALC, TRIG, CHOLHDL, LDLDIRECT in the last 72 hours. Thyroid function studies: No results for input(s): TSH, T4TOTAL, T3FREE, THYROIDAB in the last 72 hours.  Invalid input(s): FREET3 Anemia work up: Recent Labs    08/09/18 1605  VITAMINB12 117*  FERRITIN 54  TIBC 265  IRON 78   Sepsis Labs: Recent Labs  Lab 08/08/18 1753 08/09/18 0456 08/10/18 0402  WBC 8.7 6.9 5.8   Microbiology No results found for this or any previous visit (from the past 240 hour(s)).   Medications:   . amLODipine  10 mg Oral Daily  . aspirin EC  81 mg Oral Daily  . calcium carbonate  500 mg of elemental calcium Oral TID WC  . cyanocobalamin  1,000 mcg Intramuscular Daily  . ferrous sulfate  325 mg Oral TID WC  . heparin  5,000 Units Subcutaneous Q8H  . insulin aspart  0-5 Units Subcutaneous QHS  . insulin aspart  0-9 Units Subcutaneous TID WC  . nicotine  21 mg Transdermal Daily  . pantoprazole  40 mg Oral Daily  . polyethylene glycol  17 g Oral Daily  . pravastatin  10 mg Oral q1800  . sodium chloride flush  3 mL Intravenous Q12H   Continuous Infusions: . sodium chloride Stopped (08/09/18 2346)  .  sodium bicarbonate (isotonic) infusion in sterile water 100 mL/hr at 08/11/18 1650      LOS: 3 days   [facility]  08/12/2018, 8:37 AM

## 2018-08-12 NOTE — Progress Notes (Addendum)
Patient ID: Sandra Sheppard, female   DOB: 1946-04-11, 73 y.o.   MRN: [id number] S: no new complaints O:BP 99/65 (BP Location: Left Arm)   Pulse 76   Temp 98.9 F (37.2 C) (Oral)   Resp 17   Ht 5\' 4"  (1.626 m)   Wt 51.7 kg   SpO2 (!) 89%   BMI 19.57 kg/m   Intake/Output Summary (Last 24 hours) at 08/12/2018 1144 Last data filed at 08/12/2018 0600 Gross per 24 hour  Intake 2030.54 ml  Output 350 ml  Net 1680.54 ml   Intake/Output: I/O last 3 completed shifts: In: 4341.6 [P.O.:840; I.V.:3501.6] Out: 850 [Urine:850]  Intake/Output this shift:  No intake/output data recorded. Weight change:  Gen: NAD CVS: no rub Resp: cta Abd: benign Ext: no edema  Recent Labs  Lab 08/08/18 1753  08/09/18 0456  08/09/18 1605  08/10/18 0402 08/10/18 0735 08/10/18 1211 08/10/18 1628 08/10/18 2001 08/11/18 0023 08/11/18 0355 08/12/18 0604  NA 136  --  139  --  138  --  138  --   --   --   --   --  138 138  K 6.6*   < > 5.4*   < > 5.5*  5.5*   < > 4.7 4.5 4.6 4.1 4.3 4.1 4.2 4.1  CL 108  --  110  --  109  --  104  --   --   --   --   --  97* 88*  CO2 11*  --  10*  --  12*  --  16*  --   --   --   --   --  21* 30  GLUCOSE 105*  --  119*  --  165*  --  120*  --   --   --   --   --  109* 108*  BUN 99*  --  96*  --  94*  --  92*  --   --   --   --   --  91* 86*  CREATININE 12.67*  --  12.18*  --  12.06*  --  11.80*  --   --   --   --   --  11.76* 11.27*  ALBUMIN 3.3*  --   --   --  3.1*  --  2.7*  --   --   --   --   --  2.5* 2.4*  CALCIUM 7.3*  --  7.1*  --  7.3*  --  6.6*  --   --   --   --   --  6.3* 5.6*  PHOS  --   --   --   --  7.7*  --  6.7*  --   --   --   --   --  6.2* 5.7*  AST 11*  --   --   --   --   --   --   --   --   --   --   --   --   --   ALT 6  --   --   --   --   --   --   --   --   --   --   --   --   --    < > = values in this interval not displayed.   Liver Function Tests: Recent Labs  Lab 08/08/18 1753  08/10/18 0402 08/11/18 0355 08/12/18 5732  AST 11*  --   --    --   --   ALT 6  --   --   --   --   ALKPHOS 59  --   --   --   --   BILITOT 0.4  --   --   --   --   PROT 6.3*  --   --   --   --   ALBUMIN 3.3*   < > 2.7* 2.5* 2.4*   < > = values in this interval not displayed.   No results for input(s): LIPASE, AMYLASE in the last 168 hours. No results for input(s): AMMONIA in the last 168 hours. CBC: Recent Labs  Lab 08/08/18 1753 08/09/18 0456 08/09/18 1248 08/10/18 0402  WBC 8.7 6.9  --  5.8  NEUTROABS 6.2  --   --  4.1  HGB 9.2* 8.0*  --  7.6*  HCT 29.9* 24.7* 25.4* 23.1*  MCV 93.7 90.8  --  87.8  PLT 166 156  --  135*   Cardiac Enzymes: No results for input(s): CKTOTAL, CKMB, CKMBINDEX, TROPONINI in the last 168 hours. CBG: Recent Labs  Lab 08/11/18 1147 08/11/18 1639 08/11/18 2021 08/12/18 1008 08/12/18 1128  GLUCAP 114* 151* 129* 107* 101*    Iron Studies:  Recent Labs    08/09/18 1605  IRON 78  TIBC 265  FERRITIN 54   Studies/Results: No results found. Marland Kitchen amLODipine  10 mg Oral Daily  . aspirin EC  81 mg Oral Daily  . calcium carbonate  500 mg of elemental calcium Oral TID WC  . cyanocobalamin  1,000 mcg Intramuscular Daily  . ferrous sulfate  325 mg Oral TID WC  . heparin  5,000 Units Subcutaneous Q8H  . insulin aspart  0-5 Units Subcutaneous QHS  . insulin aspart  0-9 Units Subcutaneous TID WC  . nicotine  21 mg Transdermal Daily  . pantoprazole  40 mg Oral Daily  . pravastatin  10 mg Oral q1800  . sodium chloride flush  3 mL Intravenous Q12H    BMET    Component Value Date/Time   NA 138 08/12/2018 0604   K 4.1 08/12/2018 0604   CL 88 (L) 08/12/2018 0604   CO2 30 08/12/2018 0604   GLUCOSE 108 (H) 08/12/2018 0604   BUN 86 (H) 08/12/2018 0604   CREATININE 11.27 (H) 08/12/2018 0604   CALCIUM 5.6 (LL) 08/12/2018 0604   GFRNONAA 3 (L) 08/12/2018 0604   GFRAA 3 (L) 08/12/2018 0604   CBC    Component Value Date/Time   WBC 5.8 08/10/2018 0402   RBC 2.63 (L) 08/10/2018 0402   HGB 7.6 (L) 08/10/2018  0402   HCT 23.1 (L) 08/10/2018 0402   HCT 25.4 (L) 08/09/2018 1248   PLT 135 (L) 08/10/2018 0402   MCV 87.8 08/10/2018 0402   MCH 28.9 08/10/2018 0402   MCHC 32.9 08/10/2018 0402   RDW 14.5 08/10/2018 0402   LYMPHSABS 0.9 08/10/2018 0402   MONOABS 0.6 08/10/2018 0402   EOSABS 0.1 08/10/2018 0402   BASOSABS 0.0 08/10/2018 0402    Assessment/Plan:  1. 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 She presented with N/V/D after being treated for an ear infection with levoquin. Labs taken at her PCP's office were abnormal and she was referred to go to the [facility]. Labs upon admission were notable for BUN/Cr of 99/12.67 and CO2 of 11. She had been taking  lisinopril-hct as well as metformin prior to admission. These agents have been stopped and she was started on iVF's with some mild improvement. 1. DDx includes ischemic ATN in setting of volume depletion with concomitant use of lisinopril-hct and meformin. Doubt AIN as she was only on levoquin since 08/06/18. 2. Scr slowly improving. Continue to follow and cont with IVF's. 3. Not yet stable for discharge as her renal function has not significantly improved. 2. Metabolic acidosis- in setting of ARF and metformin use. Startedisotonic bicarb 08/09/18 with some improvement of CO2.  1. Corrected with isotonic bicarb.  Will change to NS. 2. Currently without symptoms. 3. Continue to hold metformin and would not resume given advanced CKD at baseline. 3. Hyperkalemia- due to #1 and #2.improved with bicarb and binders 4. Anemia- her outpatient Hbg levels had been 11.4 so this is a significant drop from 06/16/18. SPEP and UPEP are still pending.  Do have concern for possible myeloma with ARF and anemia.  Iron stores normal but low B12.  Stool guaiac still pending. 5. HTN- BP elevated. Cont to hold lisinopril-hct. 6. B12 deficiency- replete per primary. 7. N/V/D- predated levaquin dose according  to pcp's office. Will need further workup in light of anemia and ARF. 8. AVR 9. 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 10. Hypocalcemia- will replete and follow.  Should improve off of bicarb.  Will also check iPTH and vit D levels.  Donetta Potts, MD [employer] [phone]

## 2018-08-13 LAB — PROTEIN ELECTROPHORESIS, SERUM
A/G Ratio: 1.1 (ref 0.7–1.7)
Albumin ELP: 3.3 g/dL (ref 2.9–4.4)
Alpha-1-Globulin: 0.3 g/dL (ref 0.0–0.4)
Alpha-2-Globulin: 1.1 g/dL — ABNORMAL HIGH (ref 0.4–1.0)
Beta Globulin: 0.9 g/dL (ref 0.7–1.3)
Gamma Globulin: 0.7 g/dL (ref 0.4–1.8)
Globulin, Total: 3 g/dL (ref 2.2–3.9)
Total Protein ELP: 6.3 g/dL (ref 6.0–8.5)

## 2018-08-13 LAB — GLUCOSE, CAPILLARY
Glucose-Capillary: 111 mg/dL — ABNORMAL HIGH (ref 70–99)
Glucose-Capillary: 102 mg/dL — ABNORMAL HIGH (ref 70–99)
Glucose-Capillary: 173 mg/dL — ABNORMAL HIGH (ref 70–99)
Glucose-Capillary: 144 mg/dL — ABNORMAL HIGH (ref 70–99)

## 2018-08-13 LAB — RENAL FUNCTION PANEL
Albumin: 2.4 g/dL — ABNORMAL LOW (ref 3.5–5.0)
Anion gap: 18 — ABNORMAL HIGH (ref 5–15)
BUN: 86 mg/dL — ABNORMAL HIGH (ref 8–23)
CO2: 30 mmol/L (ref 22–32)
Calcium: 5.3 mg/dL — CL (ref 8.9–10.3)
Chloride: 89 mmol/L — ABNORMAL LOW (ref 98–111)
Creatinine, Ser: 11.87 mg/dL — ABNORMAL HIGH (ref 0.44–1.00)
GFR calc Af Amer: 3 mL/min — ABNORMAL LOW (ref >60)
GFR calc non Af Amer: 3 mL/min — ABNORMAL LOW (ref >60)
Glucose, Bld: 117 mg/dL — ABNORMAL HIGH (ref 70–99)
Phosphorus: 6.1 mg/dL — ABNORMAL HIGH (ref 2.5–4.6)
Potassium: 4.6 mmol/L (ref 3.5–5.1)
Sodium: 137 mmol/L (ref 135–145)

## 2018-08-13 LAB — PROTEIN / CREATININE RATIO, URINE
Creatinine, Urine: 44.94 mg/dL
Protein Creatinine Ratio: 1.6 mg/mg{Cre} — ABNORMAL HIGH (ref 0.00–0.15)
Total Protein, Urine: 72 mg/dL

## 2018-08-13 LAB — VITAMIN D 25 HYDROXY (VIT D DEFICIENCY, FRACTURES): Vit D, 25-Hydroxy: 18.7 ng/mL — ABNORMAL LOW (ref 30.0–100.0)

## 2018-08-13 LAB — PARATHYROID HORMONE, INTACT (NO CA): PTH: 204 pg/mL — ABNORMAL HIGH (ref 15–65)

## 2018-08-13 MED ORDER — DARBEPOETIN ALFA 60 MCG/0.3ML IJ SOSY
60.0000 ug | PREFILLED_SYRINGE | Freq: Once | INTRAMUSCULAR | Status: AC
  Administered 2018-08-13: 60 ug via SUBCUTANEOUS
  Filled 2018-08-13: qty 0.3

## 2018-08-13 MED ORDER — CALCITRIOL 0.25 MCG PO CAPS
0.2500 ug | ORAL_CAPSULE | Freq: Every day | ORAL | Status: DC
  Administered 2018-08-13 – 2018-08-30 (×18): 0.25 ug via ORAL
  Filled 2018-08-13 (×18): qty 1

## 2018-08-13 NOTE — Progress Notes (Addendum)
[facility] PROGRESS NOTE    Progress Note  Sandra Sheppard  RWE:[id number] DOB: 03-29-1946 DOA: 08/08/2018 PCP: Prince Solian, MD     Brief Narrative:   Sandra Sheppard is an 73 y.o. female past medical history significant for hypertension, diabetes mellitus type 2, chronic kidney disease stage III who presents with abnormal labs from the outpatient setting  Assessment/Plan:   AKI (acute kidney injury) ([place]) on   CKD (chronic kidney disease) stage 3, GFR 30-59 ml/min ([place]): Baseline creatinine 1.5-2. On admission of 12.6, renal ultrasound did not show hydronephrosis. Nephrology was consulted. Had brisk diuresis put out a liter in the last 24 hours, GFR at 3 ml/min. Appreciate nephrology's assistance. Concern about ATN,  in the setting of ACE inhibitor diuretic.  Hyperkalemia: The setting of acute kidney injury and ACE inhibitor now improved. Resolved.  Metabolic acidosis: In the setting of acute kidney injury and metformin use. Resolved with IV potassium supplementation.  Normocytic anemia: Ferritin of 54 hemoglobin of 7.6 MCV of 87. B12 117, will start B12 intramuscular repletion.  Essential hypertension: Hydrochlorothiazide lisinopril have been held. Continue amlodipine 10 mg, blood pressure is fairly controlled.  Diabetes mellitus type II: With an A1c of 6.2. Continue sliding scale insulin.  Hyperlipidemia: Continue pravastatin.  Acute confusional state: Use Seroquel at bedtime Haldol IV as needed.  GI prophylaxis: Due to significant elevated BUN she was started on Protonix empirically.  DVT prophylaxis: lovenox Family Communication:noen Disposition Plan/Barrier to D/C: unable to determine Code Status:     Code Status Orders  (From admission, onward)         Start     Ordered   08/08/18 2018  Limited resuscitation (code)  Continuous    Question Answer Comment  In the event of cardiac or respiratory ARREST: Initiate Code Blue, Call Rapid  Response Yes   In the event of cardiac or respiratory ARREST: Perform CPR Yes   In the event of cardiac or respiratory ARREST: Perform Intubation/Mechanical Ventilation No   In the event of cardiac or respiratory ARREST: Use NIPPV/BiPAp only if indicated Yes   In the event of cardiac or respiratory ARREST: Administer ACLS medications if indicated Yes   In the event of cardiac or respiratory ARREST: Perform Defibrillation or Cardioversion if indicated Yes      08/08/18 2017        Code Status History    Date Active Date Inactive Code Status Order ID Comments User Context   08/08/2018 2004 08/08/2018 2017 Full Code [id number]  Vianne Bulls, MD ED   07/04/2016 2149 07/05/2016 1816 Partial Code [id number]  Lily Kocher, MD ED        IV Access:    Peripheral IV   Procedures and diagnostic studies:   No results found.   Medical Consultants:    None.  Anti-Infectives:    Subjective:    [name] no confusion overnight she continued relates she feels well.  Slightly nauseated but tolerating her diet.  Objective:    Vitals:   08/12/18 1632 08/12/18 2100 08/13/18 0446 08/13/18 0851  BP: (!) 94/53 (!) 110/56 106/74 111/64  Pulse: 70 73 73 67  Resp: 18 16 15 18   Temp: 98.6 F (37 C) 98.5 F (36.9 C) 98.4 F (36.9 C) 98.2 F (36.8 C)  TempSrc: Oral Oral Oral Oral  SpO2: 93% (!) 76%    Weight:  51.8 kg    Height:        Intake/Output Summary (Last 24  hours) at 08/13/2018 1014 Last data filed at 08/13/2018 0600 Gross per 24 hour  Intake 1191.42 ml  Output 600 ml  Net 591.42 ml   Filed Weights   08/08/18 2151 08/12/18 2100  Weight: 51.7 kg 51.8 kg    Exam: General exam: In no acute distress. Respiratory system: Good air movement and clear to auscultation. Cardiovascular system: S1 & S2 heard, RRR.  Gastrointestinal system: Abdomen is nondistended, soft and nontender.  Central nervous system: Alert and oriented. No focal neurological  deficits. Extremities: No pedal edema. Skin: No rashes, lesions or ulcers Psychiatry: Judgment and insight are normal.   Data Reviewed:    Labs: Basic Metabolic Panel: Recent Labs  Lab 08/09/18 1605  08/10/18 0402  08/11/18 0355 08/12/18 0604 08/13/18 0432  NA 138  --  138  --  138 138 137  K 5.5*  5.5*   < > 4.7   < > 4.2 4.1 4.6  CL 109  --  104  --  97* 88* 89*  CO2 12*  --  16*  --  21* 30 30  GLUCOSE 165*  --  120*  --  109* 108* 117*  BUN 94*  --  92*  --  91* 86* 86*  CREATININE 12.06*  --  11.80*  --  11.76* 11.27* 11.87*  CALCIUM 7.3*  --  6.6*  --  6.3* 5.6* 5.3*  PHOS 7.7*  --  6.7*  --  6.2* 5.7* 6.1*   < > = values in this interval not displayed.   GFR Estimated Creatinine Clearance: 3.5 mL/min (A) (by C-G formula based on SCr of 11.87 mg/dL (H)). Liver Function Tests: Recent Labs  Lab 08/08/18 1753 08/09/18 1605 08/10/18 0402 08/11/18 0355 08/12/18 0604 08/13/18 0432  AST 11*  --   --   --   --   --   ALT 6  --   --   --   --   --   ALKPHOS 59  --   --   --   --   --   BILITOT 0.4  --   --   --   --   --   PROT 6.3*  --   --   --   --   --   ALBUMIN 3.3* 3.1* 2.7* 2.5* 2.4* 2.4*   No results for input(s): LIPASE, AMYLASE in the last 168 hours. No results for input(s): AMMONIA in the last 168 hours. Coagulation profile No results for input(s): INR, PROTIME in the last 168 hours.  CBC: Recent Labs  Lab 08/08/18 1753 08/09/18 0456 08/09/18 1248 08/10/18 0402  WBC 8.7 6.9  --  5.8  NEUTROABS 6.2  --   --  4.1  HGB 9.2* 8.0*  --  7.6*  HCT 29.9* 24.7* 25.4* 23.1*  MCV 93.7 90.8  --  87.8  PLT 166 156  --  135*   Cardiac Enzymes: No results for input(s): CKTOTAL, CKMB, CKMBINDEX, TROPONINI in the last 168 hours. BNP (last 3 results) No results for input(s): PROBNP in the last 8760 hours. CBG: Recent Labs  Lab 08/12/18 1008 08/12/18 1128 08/12/18 1614 08/12/18 2059 08/13/18 0709  GLUCAP 107* 101* 104* 116* 111*   D-Dimer: No  results for input(s): DDIMER in the last 72 hours. Hgb A1c: No results for input(s): HGBA1C in the last 72 hours. Lipid Profile: No results for input(s): CHOL, HDL, LDLCALC, TRIG, CHOLHDL, LDLDIRECT in the last 72 hours. Thyroid function studies: No results for  input(s): TSH, T4TOTAL, T3FREE, THYROIDAB in the last 72 hours.  Invalid input(s): FREET3 Anemia work up: No results for input(s): VITAMINB12, FOLATE, FERRITIN, TIBC, IRON, RETICCTPCT in the last 72 hours. Sepsis Labs: Recent Labs  Lab 08/08/18 1753 08/09/18 0456 08/10/18 0402  WBC 8.7 6.9 5.8   Microbiology No results found for this or any previous visit (from the past 240 hour(s)).   Medications:   . amLODipine  10 mg Oral Daily  . aspirin EC  81 mg Oral Daily  . cyanocobalamin  1,000 mcg Intramuscular Daily  . ferrous sulfate  325 mg Oral TID WC  . heparin  5,000 Units Subcutaneous Q8H  . insulin aspart  0-5 Units Subcutaneous QHS  . insulin aspart  0-9 Units Subcutaneous TID WC  . nicotine  21 mg Transdermal Daily  . pantoprazole  40 mg Oral Daily  . pravastatin  10 mg Oral q1800  . sodium chloride flush  3 mL Intravenous Q12H   Continuous Infusions: . sodium chloride Stopped (08/09/18 2346)  . sodium chloride 50 mL/hr at 08/13/18 0850      LOS: 4 days   Charlynne Cousins  [facility]  08/13/2018, 10:14 AM

## 2018-08-13 NOTE — Progress Notes (Signed)
Physical Therapy Treatment Patient Details Name: Sandra Sheppard MRN: [id number] DOB: 01-19-1946 Today's Date: 08/13/2018    History of Present Illness Pt is a 73 y/o F with significant PMH of hypertension, type 2 diabetes mellitus, and chronic kidney disease stage III now presenting for evaluation of abnormal outpatient blood work and found to have an acute kidney injury.    PT Comments    Pt progressing towards physical therapy goals. Was able to perform transfers and ambulation with occasional min assist for balance support, walker management, and safety. Tolerance for functional activity is decreased at this time, and pt requires frequent standing rest breaks during gait training activity. Will continue to follow and progress as able per POC.     Follow Up Recommendations  Home health PT;Supervision for mobility/OOB     Equipment Recommendations  Rolling walker with 5" wheels;3in1 (PT)    Recommendations for Other Services OT consult     Precautions / Restrictions Precautions Precautions: Fall Restrictions Weight Bearing Restrictions: No    Mobility  Bed Mobility               General bed mobility comments: Pt was received OOB in chair  Transfers Overall transfer level: Needs assistance Equipment used: Rolling walker (2 wheeled) Transfers: Sit to/from Stand Sit to Stand: Min guard         General transfer comment: pt was able to power up to full stand without assistance. Hands-on guarding provided for safety. Hand-over-hand assist required for pt to place hands on arm rests of chair to push up from.   Ambulation/Gait Ambulation/Gait assistance: Min assist;+2 safety/equipment Gait Distance (Feet): 100 Feet Assistive device: Rolling walker (2 wheeled) Gait Pattern/deviations: Step-through pattern;Decreased stride length Gait velocity: decreased Gait velocity interpretation: <1.8 ft/sec, indicate of risk for recurrent falls General Gait Details: VC's for  improved posture and closer walker proximity. Pt required occasional assist for walker management around obstacles in the hall. Pt fatiguing quickly and asking to turn around after ~50 feet. 4 rest breaks taken total.    Stairs             Wheelchair Mobility    Modified Rankin (Stroke Patients Only)       Balance Overall balance assessment: Needs assistance Sitting-balance support: Feet supported Sitting balance-Leahy Scale: Fair     Standing balance support: Bilateral upper extremity supported Standing balance-Leahy Scale: Poor                              Cognition Arousal/Alertness: Awake/alert Behavior During Therapy: WFL for tasks assessed/performed Overall Cognitive Status: Impaired/Different from baseline Area of Impairment: Safety/judgement;Following commands                       Following Commands: Follows multi-step commands inconsistently Safety/Judgement: Decreased awareness of safety;Decreased awareness of deficits            Exercises      General Comments        Pertinent Vitals/Pain Pain Assessment: Faces Faces Pain Scale: No hurt Pain Intervention(s): Monitored during session    Home Living                      Prior Function            PT Goals (current goals can now be found in the care plan section) Acute Rehab PT Goals Patient Stated Goal: Pt did not state  goals during session PT Goal Formulation: With patient Time For Goal Achievement: 08/24/18 Potential to Achieve Goals: Good Progress towards PT goals: Progressing toward goals    Frequency    Min 3X/week      PT Plan Current plan remains appropriate    Co-evaluation              AM-PAC PT "6 Clicks" Mobility   Outcome Measure  Help needed turning from your back to your side while in a flat bed without using bedrails?: None Help needed moving from lying on your back to sitting on the side of a flat bed without using  bedrails?: A Little Help needed moving to and from a bed to a chair (including a wheelchair)?: A Little Help needed standing up from a chair using your arms (e.g., wheelchair or bedside chair)?: A Lot Help needed to walk in hospital room?: A Little Help needed climbing 3-5 steps with a railing? : A Lot 6 Click Score: 17    End of Session Equipment Utilized During Treatment: Gait belt Activity Tolerance: Patient tolerated treatment well Patient left: with call bell/phone within reach;in bed;with bed alarm set;with family/visitor present Nurse Communication: Mobility status PT Visit Diagnosis: Unsteadiness on feet (R26.81);Difficulty in walking, not elsewhere classified (R26.2)     Time: 1140-1200 PT Time Calculation (min) (ACUTE ONLY): 20 min  Charges:  $Gait Training: 8-22 mins                     Rolinda Roan, PT, DPT Acute Rehabilitation Services Pager: [phone] Office: [phone]    [name] 08/13/2018, 1:34 PM

## 2018-08-13 NOTE — Progress Notes (Signed)
[facility] NEPHROLOGY PROGRESS NOTE  Assessment/ Plan: Pt is a 73 y.o. yo female with history of hypertension, diabetes, CKD stage III admitted with abnormal lab results.  #AKI on CKD stage III: Baseline creatinine around 1.5-2, presented with nausea vomiting diarrhea and the lab taken at PCP office were abnormal.  On admission patient had BUN 99, creatinine 12.67 and CO2 of 11.  It is likely ischemic ATN in the setting of concomitant use of lisinopril/hydrochlorothiazide and metformin.  The urinalysis with no RBC or WBC but has protein 100.  Doubt AIN.  I will check urine protein creatinine ratio.  Patient is nonoliguric however serum creatinine level is still up at 11.87.  She looks dry on exam therefore increasing IV fluid 200 cc an hour.  Encourage oral intake.  I will do a bladder scan.  Ultrasound kidneys with increased echogenicity but no obstruction.  She has no uremic symptoms.  #Metabolic acidosis corrected with bicarbonate.  #Hyperkalemia: Improved  #Anemia: UPE still pending. Check K/lamda ratio.  Iron saturation 29.  Hemoglobin is still low.  I will order a dose of aranesp.  #Secondary hyperparathyroidism PTH 204.  Monitor phosphorus level.  She has hypocalcemia, I will start calcitriol.  Subjective: Seen and examined at bedside.  Able to lie flat.  Denied nausea, vomiting, chest pain, shortness of breath. Objective Vital signs in last 24 hours: Vitals:   08/12/18 1632 08/12/18 2100 08/13/18 0446 08/13/18 0851  BP: (!) 94/53 (!) 110/56 106/74 111/64  Pulse: 70 73 73 67  Resp: 18 16 15 18   Temp: 98.6 F (37 C) 98.5 F (36.9 C) 98.4 F (36.9 C) 98.2 F (36.8 C)  TempSrc: Oral Oral Oral Oral  SpO2: 93% (!) 76%    Weight:  51.8 kg    Height:       Weight change:   Intake/Output Summary (Last 24 hours) at 08/13/2018 1517 Last data filed at 08/13/2018 1300 Gross per 24 hour  Intake 1219.97 ml  Output 850 ml  Net 369.97 ml       Labs: Basic Metabolic  Panel: Recent Labs  Lab 08/11/18 0355 08/12/18 0604 08/13/18 0432  NA 138 138 137  K 4.2 4.1 4.6  CL 97* 88* 89*  CO2 21* 30 30  GLUCOSE 109* 108* 117*  BUN 91* 86* 86*  CREATININE 11.76* 11.27* 11.87*  CALCIUM 6.3* 5.6* 5.3*  PHOS 6.2* 5.7* 6.1*   Liver Function Tests: Recent Labs  Lab 08/08/18 1753  08/11/18 0355 08/12/18 0604 08/13/18 0432  AST 11*  --   --   --   --   ALT 6  --   --   --   --   ALKPHOS 59  --   --   --   --   BILITOT 0.4  --   --   --   --   PROT 6.3*  --   --   --   --   ALBUMIN 3.3*   < > 2.5* 2.4* 2.4*   < > = values in this interval not displayed.   No results for input(s): LIPASE, AMYLASE in the last 168 hours. No results for input(s): AMMONIA in the last 168 hours. CBC: Recent Labs  Lab 08/08/18 1753 08/09/18 0456 08/09/18 1248 08/10/18 0402  WBC 8.7 6.9  --  5.8  NEUTROABS 6.2  --   --  4.1  HGB 9.2* 8.0*  --  7.6*  HCT 29.9* 24.7* 25.4* 23.1*  MCV 93.7 90.8  --  87.8  PLT 166 156  --  135*   Cardiac Enzymes: No results for input(s): CKTOTAL, CKMB, CKMBINDEX, TROPONINI in the last 168 hours. CBG: Recent Labs  Lab 08/12/18 1128 08/12/18 1614 08/12/18 2059 08/13/18 0709 08/13/18 1116  GLUCAP 101* 104* 116* 111* 102*    Iron Studies: No results for input(s): IRON, TIBC, TRANSFERRIN, FERRITIN in the last 72 hours. Studies/Results: No results found.  Medications: Infusions: . sodium chloride Stopped (08/09/18 2346)  . sodium chloride 50 mL/hr at 08/13/18 0850    Scheduled Medications: . amLODipine  10 mg Oral Daily  . aspirin EC  81 mg Oral Daily  . cyanocobalamin  1,000 mcg Intramuscular Daily  . ferrous sulfate  325 mg Oral TID WC  . heparin  5,000 Units Subcutaneous Q8H  . insulin aspart  0-5 Units Subcutaneous QHS  . insulin aspart  0-9 Units Subcutaneous TID WC  . nicotine  21 mg Transdermal Daily  . pantoprazole  40 mg Oral Daily  . pravastatin  10 mg Oral q1800  . sodium chloride flush  3 mL Intravenous  Q12H    have reviewed scheduled and prn medications.  Physical Exam: General:NAD, lying in bed comfortable Heart:RRR, s1s2 nl no rubs Lungs:clear b/l, no rales or wheezing Abdomen:soft, Non-tender, non-distended Extremities:No edema Neurology: Alert, awake, following commands, no asterixis.  Dron Prasad Bhandari 08/13/2018,3:17 PM  LOS: 4 days

## 2018-08-13 NOTE — Care Management Important Message (Signed)
Important Message  Patient Details  Name: Sandra Sheppard MRN: [id number] Date of Birth: [birth year]   Medicare Important Message Given:  Yes    Orbie Pyo 08/13/2018, 4:33 PM

## 2018-08-14 LAB — GLUCOSE, CAPILLARY
Glucose-Capillary: 109 mg/dL — ABNORMAL HIGH (ref 70–99)
Glucose-Capillary: 122 mg/dL — ABNORMAL HIGH (ref 70–99)
Glucose-Capillary: 114 mg/dL — ABNORMAL HIGH (ref 70–99)
Glucose-Capillary: 129 mg/dL — ABNORMAL HIGH (ref 70–99)

## 2018-08-14 LAB — RENAL FUNCTION PANEL
Albumin: 2.2 g/dL — ABNORMAL LOW (ref 3.5–5.0)
Anion gap: 17 — ABNORMAL HIGH (ref 5–15)
BUN: 83 mg/dL — ABNORMAL HIGH (ref 8–23)
CO2: 27 mmol/L (ref 22–32)
Calcium: 5.2 mg/dL — CL (ref 8.9–10.3)
Chloride: 91 mmol/L — ABNORMAL LOW (ref 98–111)
Creatinine, Ser: 11.2 mg/dL — ABNORMAL HIGH (ref 0.44–1.00)
GFR calc Af Amer: 3 mL/min — ABNORMAL LOW (ref >60)
GFR calc non Af Amer: 3 mL/min — ABNORMAL LOW (ref >60)
Glucose, Bld: 118 mg/dL — ABNORMAL HIGH (ref 70–99)
Phosphorus: 6.1 mg/dL — ABNORMAL HIGH (ref 2.5–4.6)
Potassium: 4.2 mmol/L (ref 3.5–5.1)
Sodium: 135 mmol/L (ref 135–145)

## 2018-08-14 LAB — UPEP/UIFE/LIGHT CHAINS/TP, 24-HR UR
% BETA, Urine: 17.3 %
ALPHA 1 URINE: 3.6 %
Albumin, U: 44 %
Alpha 2, Urine: 14.1 %
Free Kappa Lt Chains,Ur: 193.39 mg/L — ABNORMAL HIGH (ref 0.63–113.79)
Free Kappa/Lambda Ratio: 2.38 (ref 1.03–31.76)
Free Lambda Lt Chains,Ur: 81.25 mg/L — ABNORMAL HIGH (ref 0.47–11.77)
GAMMA GLOBULIN URINE: 21 %
Total Protein, Urine-Ur/day: 230 mg/24 hr — ABNORMAL HIGH (ref 30–150)
Total Protein, Urine: 45.9 mg/dL
Total Volume: 500

## 2018-08-14 MED ORDER — CALCIUM GLUCONATE-NACL 1-0.675 GM/50ML-% IV SOLN
1.0000 g | Freq: Once | INTRAVENOUS | Status: AC
  Administered 2018-08-14: 1000 mg via INTRAVENOUS
  Filled 2018-08-14: qty 50

## 2018-08-14 MED ORDER — CALCIUM ACETATE (PHOS BINDER) 667 MG PO CAPS
667.0000 mg | ORAL_CAPSULE | Freq: Three times a day (TID) | ORAL | Status: DC
  Administered 2018-08-14 – 2018-08-15 (×3): 667 mg via ORAL
  Filled 2018-08-14 (×3): qty 1

## 2018-08-14 NOTE — Care Management Note (Signed)
Case Management Note Manya Silvas, RN MSN CCM Transitions of Care 75[address] [place] [phone]  Patient Details  Name: Sandra Sheppard MRN: [id number] Date of Birth: 1945-11-09  Subjective/Objective:      AKI             Action/Plan: Spoke with patient at bedside-2 sisters and a brother-in-law at bedside-patient agreed to have CM speak in front of family. PTA patient home alone. Patient expressed concerns about back steps that would be used to take dog in and out of the house, however, a neighbor will be assisting with caring for the dog. Family and patient very engaged in conversation. Family supportive, and will be assisting patient with f/u medical appointments and picking up prescriptions. Patient states that she usually has her prescriptions filled at [facility].   Discussed PT recommendations for HHPT. Patient agreeable. Choice offered. [facility] accepted referral. Referral made to [facility] for RW and 3/1.   CM to follow for transition of care needs.   Expected Discharge Date:                  Expected Discharge Plan:  [place]  In-House Referral:  NA  Discharge planning Services  CM Consult  Post Acute Care Choice:  Durable Medical Equipment, Home Health Choice offered to:  Patient  DME Arranged:  3-N-1, Walker rolling DME Agency:  [facility]  HH Arranged:  PT HH Agency:  [facility]  Status of Service:  In process, will continue to follow  If discussed at Long Length of Stay Meetings, dates discussed:    Additional Comments:  Bartholomew Crews, RN 08/14/2018, 1:35 PM

## 2018-08-14 NOTE — Progress Notes (Signed)
[facility] NEPHROLOGY PROGRESS NOTE  Assessment/ Plan: Pt is a 73 y.o. yo female with history of hypertension, diabetes, CKD stage III admitted with abnormal lab results.  #AKI on CKD stage III: Baseline creatinine around 1.5-2, presented with nausea vomiting diarrhea and the lab taken at PCP office were abnormal.  On admission patient had BUN 99, creatinine 12.67 and CO2 of 11.  It is likely ischemic ATN in the setting of concomitant use of lisinopril/hydrochlorothiazide and metformin.  The urinalysis with no RBC or WBC but has protein 100.  Doubt AIN.  -urine PCR 1.6g, k/lamda ratio 2.38. -Ultrasound kidneys with increased echogenicity but no obstruction. -Patient is nonoliguric however serum creatinine level is fairly slow to improve.  Creatinine level is 11.2 today.  She does not have uremic features.  I had a long discussion with the patient and her sisters today regarding slow recovery of her kidney function and possible need for dialysis if no improvement or associated with any uremic symptoms.  For now continue IV fluid and monitor kidney function.    #Metabolic acidosis corrected with bicarbonate.  #Hyperkalemia: Improved  #Anemia: K/lamda ratio acceptable for renal failure patient.  Iron saturation 29.   received aranesp on 3/2.  #Secondary hyperparathyroidism PTH 204.  Start PhosLo.  Continue calcitriol replete calcium gluconate.  Subjective: Seen and examined at bedside.  No chest pain, shortness of breath, nausea vomiting.  Her sisters at bedside.    Objective Vital signs in last 24 hours: Vitals:   08/14/18 0358 08/14/18 0429 08/14/18 0844 08/14/18 1046  BP: 94/67  119/64 (!) 108/52  Pulse: 71 71 71   Resp: 16  18   Temp: 98.1 F (36.7 C)  99 F (37.2 C)   TempSrc: Oral  Oral   SpO2: (!) 73% 93% (!) 78%   Weight:      Height:       Weight change: 8.9 kg  Intake/Output Summary (Last 24 hours) at 08/14/2018 1257 Last data filed at 08/14/2018 0758 Gross  per 24 hour  Intake 2509.2 ml  Output 525 ml  Net 1984.2 ml       Labs: Basic Metabolic Panel: Recent Labs  Lab 08/12/18 0604 08/13/18 0432 08/14/18 0436  NA 138 137 135  K 4.1 4.6 4.2  CL 88* 89* 91*  CO2 30 30 27   GLUCOSE 108* 117* 118*  BUN 86* 86* 83*  CREATININE 11.27* 11.87* 11.20*  CALCIUM 5.6* 5.3* 5.2*  PHOS 5.7* 6.1* 6.1*   Liver Function Tests: Recent Labs  Lab 08/08/18 1753  08/12/18 0604 08/13/18 0432 08/14/18 0436  AST 11*  --   --   --   --   ALT 6  --   --   --   --   ALKPHOS 59  --   --   --   --   BILITOT 0.4  --   --   --   --   PROT 6.3*  --   --   --   --   ALBUMIN 3.3*   < > 2.4* 2.4* 2.2*   < > = values in this interval not displayed.   No results for input(s): LIPASE, AMYLASE in the last 168 hours. No results for input(s): AMMONIA in the last 168 hours. CBC: Recent Labs  Lab 08/08/18 1753 08/09/18 0456 08/09/18 1248 08/10/18 0402  WBC 8.7 6.9  --  5.8  NEUTROABS 6.2  --   --  4.1  HGB 9.2* 8.0*  --  7.6*  HCT 29.9* 24.7* 25.4* 23.1*  MCV 93.7 90.8  --  87.8  PLT 166 156  --  135*   Cardiac Enzymes: No results for input(s): CKTOTAL, CKMB, CKMBINDEX, TROPONINI in the last 168 hours. CBG: Recent Labs  Lab 08/13/18 1116 08/13/18 1623 08/13/18 2126 08/14/18 0740 08/14/18 1140  GLUCAP 102* 173* 144* 109* 122*    Iron Studies: No results for input(s): IRON, TIBC, TRANSFERRIN, FERRITIN in the last 72 hours. Studies/Results: No results found.  Medications: Infusions: . sodium chloride Stopped (08/09/18 2346)  . sodium chloride 100 mL/hr at 08/14/18 1039    Scheduled Medications: . amLODipine  10 mg Oral Daily  . aspirin EC  81 mg Oral Daily  . calcitRIOL  0.25 mcg Oral Daily  . cyanocobalamin  1,000 mcg Intramuscular Daily  . ferrous sulfate  325 mg Oral TID WC  . heparin  5,000 Units Subcutaneous Q8H  . insulin aspart  0-5 Units Subcutaneous QHS  . insulin aspart  0-9 Units Subcutaneous TID WC  . nicotine  21 mg  Transdermal Daily  . pantoprazole  40 mg Oral Daily  . pravastatin  10 mg Oral q1800  . sodium chloride flush  3 mL Intravenous Q12H    have reviewed scheduled and prn medications.  Physical Exam: General:NAD, lying in bed comfortable Heart:RRR, s1s2 nl no rubs Lungs: Bibasilar decreased breath sound, occasional expiratory wheeze, no crackle Abdomen:soft, Non-tender, non-distended Extremities: No edema Neurology: Alert, awake, following commands, no asterixis.  Sandra Sheppard Sandra Sheppard Sandra Sheppard 08/14/2018,12:57 PM  LOS: 5 days

## 2018-08-14 NOTE — Progress Notes (Signed)
CRITICAL VALUE ALERT  Critical Value:  Calcium 5.2  Date & Time Notied:  08/14/2018 @ 05:44  Provider Notified: Samuella Bruin 08/14/2018 @ 05:48  Orders Received/Actions taken: No order received.

## 2018-08-14 NOTE — Progress Notes (Addendum)
[facility] PROGRESS NOTE    Progress Note  Sandra Sheppard  HYQ:[id number] DOB: [birth year] DOA: 08/08/2018 PCP: Prince Solian, MD     Brief Narrative:   Sandra Sheppard is an 73 y.o. female past medical history significant for hypertension, diabetes mellitus type 2, chronic kidney disease stage III who presents with abnormal labs from the outpatient setting  Assessment/Plan:   AKI (acute kidney injury) ([place]) on   CKD (chronic kidney disease) stage 3, GFR 30-59 ml/min ([place]): Baseline creatinine 1.5-2.Concern about ATN,  in the setting of ACE inhibitor and diuretic. On admission of 12.6, renal ultrasound did not show hydronephrosis. Cr slow to improved still on IV fluids but has remain oliguric. Urine output has decreased today, GFR remains at 3.  Further management per renal.  Hyperkalemia: The setting of acute kidney injury and ACE inhibitor now improved. Resolved.  Metabolic acidosis: In the setting of acute kidney injury and metformin use. Improved with bicarbonate supplementation with IV fluids.  Normocytic anemia: Ferritin of 54 hemoglobin of 7.6 MCV of 87. B12 117, will start B12 intramuscular repletion.  Essential hypertension: Hydrochlorothiazide lisinopril have been held. Continue amlodipine 10 mg, blood pressure is fairly controlled.  Diabetes mellitus type II: With an A1c of 6.2. Continue sliding scale insulin.  Hyperlipidemia: Continue pravastatin.  Acute confusional state: Use Seroquel at bedtime Haldol IV as needed.  GI prophylaxis: Due to significant elevated BUN she was started on Protonix empirically.  DVT prophylaxis: lovenox Family Communication:noen Disposition Plan/Barrier to D/C: unable to determine Code Status:     Code Status Orders  (From admission, onward)         Start     Ordered   08/08/18 2018  Limited resuscitation (code)  Continuous    Question Answer Comment  In the event of cardiac or respiratory ARREST: Initiate  Code Blue, Call Rapid Response Yes   In the event of cardiac or respiratory ARREST: Perform CPR Yes   In the event of cardiac or respiratory ARREST: Perform Intubation/Mechanical Ventilation No   In the event of cardiac or respiratory ARREST: Use NIPPV/BiPAp only if indicated Yes   In the event of cardiac or respiratory ARREST: Administer ACLS medications if indicated Yes   In the event of cardiac or respiratory ARREST: Perform Defibrillation or Cardioversion if indicated Yes      08/08/18 2017        Code Status History    Date Active Date Inactive Code Status Order ID Comments User Context   08/08/2018 2004 08/08/2018 2017 Full Code [id number]  Vianne Bulls, MD ED   07/04/2016 2149 07/05/2016 1816 Partial Code [id number]  Lily Kocher, MD ED        IV Access:    Peripheral IV   Procedures and diagnostic studies:   No results found.   Medical Consultants:    None.  Anti-Infectives:    Subjective:    Sandra Sheppard no complaints.  Objective:    Vitals:   08/13/18 2210 08/14/18 0358 08/14/18 0429 08/14/18 0844  BP:  94/67  119/64  Pulse:  71 71 71  Resp:  16  18  Temp:  98.1 F (36.7 C)  99 F (37.2 C)  TempSrc:  Oral  Oral  SpO2: 93% (!) 73% 93% (!) 78%  Weight:      Height:        Intake/Output Summary (Last 24 hours) at 08/14/2018 0940 Last data filed at 08/14/2018 0758 Gross per 24 hour  Intake  2569.2 ml  Output 775 ml  Net 1794.2 ml   Filed Weights   08/08/18 2151 08/12/18 2100 08/13/18 2124  Weight: 51.7 kg 51.8 kg 60.7 kg    Exam: General exam: In no acute distress. Respiratory system: Good air movement and clear to auscultation. Cardiovascular system: S1 & S2 heard, RRR.  Gastrointestinal system: Abdomen is nondistended, soft and nontender.  Central nervous system: Alert and oriented. No focal neurological deficits. Extremities: No pedal edema. Skin: No rashes, lesions or ulcers Psychiatry: Judgment and insight are normal.   Data  Reviewed:    Labs: Basic Metabolic Panel: Recent Labs  Lab 08/10/18 0402  08/11/18 0355 08/12/18 0604 08/13/18 0432 08/14/18 0436  NA 138  --  138 138 137 135  K 4.7   < > 4.2 4.1 4.6 4.2  CL 104  --  97* 88* 89* 91*  CO2 16*  --  21* 30 30 27   GLUCOSE 120*  --  109* 108* 117* 118*  BUN 92*  --  91* 86* 86* 83*  CREATININE 11.80*  --  11.76* 11.27* 11.87* 11.20*  CALCIUM 6.6*  --  6.3* 5.6* 5.3* 5.2*  PHOS 6.7*  --  6.2* 5.7* 6.1* 6.1*   < > = values in this interval not displayed.   GFR Estimated Creatinine Clearance: 3.9 mL/min (A) (by C-G formula based on SCr of 11.2 mg/dL (H)). Liver Function Tests: Recent Labs  Lab 08/08/18 1753  08/10/18 0402 08/11/18 0355 08/12/18 0604 08/13/18 0432 08/14/18 0436  AST 11*  --   --   --   --   --   --   ALT 6  --   --   --   --   --   --   ALKPHOS 59  --   --   --   --   --   --   BILITOT 0.4  --   --   --   --   --   --   PROT 6.3*  --   --   --   --   --   --   ALBUMIN 3.3*   < > 2.7* 2.5* 2.4* 2.4* 2.2*   < > = values in this interval not displayed.   No results for input(s): LIPASE, AMYLASE in the last 168 hours. No results for input(s): AMMONIA in the last 168 hours. Coagulation profile No results for input(s): INR, PROTIME in the last 168 hours.  CBC: Recent Labs  Lab 08/08/18 1753 08/09/18 0456 08/09/18 1248 08/10/18 0402  WBC 8.7 6.9  --  5.8  NEUTROABS 6.2  --   --  4.1  HGB 9.2* 8.0*  --  7.6*  HCT 29.9* 24.7* 25.4* 23.1*  MCV 93.7 90.8  --  87.8  PLT 166 156  --  135*   Cardiac Enzymes: No results for input(s): CKTOTAL, CKMB, CKMBINDEX, TROPONINI in the last 168 hours. BNP (last 3 results) No results for input(s): PROBNP in the last 8760 hours. CBG: Recent Labs  Lab 08/13/18 0709 08/13/18 1116 08/13/18 1623 08/13/18 2126 08/14/18 0740  GLUCAP 111* 102* 173* 144* 109*   D-Dimer: No results for input(s): DDIMER in the last 72 hours. Hgb A1c: No results for input(s): HGBA1C in the last 72  hours. Lipid Profile: No results for input(s): CHOL, HDL, LDLCALC, TRIG, CHOLHDL, LDLDIRECT in the last 72 hours. Thyroid function studies: No results for input(s): TSH, T4TOTAL, T3FREE, THYROIDAB in the last 72 hours.  Invalid  input(s): FREET3 Anemia work up: No results for input(s): VITAMINB12, FOLATE, FERRITIN, TIBC, IRON, RETICCTPCT in the last 72 hours. Sepsis Labs: Recent Labs  Lab 08/08/18 1753 08/09/18 0456 08/10/18 0402  WBC 8.7 6.9 5.8   Microbiology No results found for this or any previous visit (from the past 240 hour(s)).   Medications:   . amLODipine  10 mg Oral Daily  . aspirin EC  81 mg Oral Daily  . calcitRIOL  0.25 mcg Oral Daily  . cyanocobalamin  1,000 mcg Intramuscular Daily  . ferrous sulfate  325 mg Oral TID WC  . heparin  5,000 Units Subcutaneous Q8H  . insulin aspart  0-5 Units Subcutaneous QHS  . insulin aspart  0-9 Units Subcutaneous TID WC  . nicotine  21 mg Transdermal Daily  . pantoprazole  40 mg Oral Daily  . pravastatin  10 mg Oral q1800  . sodium chloride flush  3 mL Intravenous Q12H   Continuous Infusions: . sodium chloride Stopped (08/09/18 2346)  . sodium chloride 100 mL/hr at 08/13/18 2211      LOS: 5 days   Charlynne Cousins  [facility]  08/14/2018, 9:40 AM

## 2018-08-15 DIAGNOSIS — R627 Adult failure to thrive: Secondary | ICD-10-CM

## 2018-08-15 LAB — KAPPA/LAMBDA LIGHT CHAINS
Kappa free light chain: 104.3 mg/L — ABNORMAL HIGH (ref 3.3–19.4)
Kappa, lambda light chain ratio: 1.36 (ref 0.26–1.65)
Lambda free light chains: 76.7 mg/L — ABNORMAL HIGH (ref 5.7–26.3)

## 2018-08-15 LAB — CBC
HCT: 26.9 % — ABNORMAL LOW (ref 36.0–46.0)
Hemoglobin: 8.9 g/dL — ABNORMAL LOW (ref 12.0–15.0)
MCH: 29.8 pg (ref 26.0–34.0)
MCHC: 33.1 g/dL (ref 30.0–36.0)
MCV: 90 fL (ref 80.0–100.0)
Platelets: 131 10*3/uL — ABNORMAL LOW (ref 150–400)
RBC: 2.99 MIL/uL — ABNORMAL LOW (ref 3.87–5.11)
RDW: 14.2 % (ref 11.5–15.5)
WBC: 9.8 10*3/uL (ref 4.0–10.5)
nRBC: 0 % (ref 0.0–0.2)

## 2018-08-15 LAB — GLUCOSE, CAPILLARY
Glucose-Capillary: 101 mg/dL — ABNORMAL HIGH (ref 70–99)
Glucose-Capillary: 97 mg/dL (ref 70–99)
Glucose-Capillary: 106 mg/dL — ABNORMAL HIGH (ref 70–99)
Glucose-Capillary: 169 mg/dL — ABNORMAL HIGH (ref 70–99)

## 2018-08-15 LAB — RENAL FUNCTION PANEL
Albumin: 2.5 g/dL — ABNORMAL LOW (ref 3.5–5.0)
Anion gap: 20 — ABNORMAL HIGH (ref 5–15)
BUN: 78 mg/dL — ABNORMAL HIGH (ref 8–23)
CO2: 21 mmol/L — ABNORMAL LOW (ref 22–32)
Calcium: 5.7 mg/dL — CL (ref 8.9–10.3)
Chloride: 96 mmol/L — ABNORMAL LOW (ref 98–111)
Creatinine, Ser: 11.05 mg/dL — ABNORMAL HIGH (ref 0.44–1.00)
GFR calc Af Amer: 4 mL/min — ABNORMAL LOW (ref >60)
GFR calc non Af Amer: 3 mL/min — ABNORMAL LOW (ref >60)
Glucose, Bld: 98 mg/dL (ref 70–99)
Phosphorus: 6 mg/dL — ABNORMAL HIGH (ref 2.5–4.6)
Potassium: 4.4 mmol/L (ref 3.5–5.1)
Sodium: 137 mmol/L (ref 135–145)

## 2018-08-15 MED ORDER — CALCIUM GLUCONATE-NACL 1-0.675 GM/50ML-% IV SOLN
1.0000 g | Freq: Once | INTRAVENOUS | Status: AC
  Administered 2018-08-15: 1000 mg via INTRAVENOUS
  Filled 2018-08-15 (×2): qty 50

## 2018-08-15 MED ORDER — CALCIUM ACETATE (PHOS BINDER) 667 MG PO CAPS
1334.0000 mg | ORAL_CAPSULE | Freq: Three times a day (TID) | ORAL | Status: DC
  Administered 2018-08-15 – 2018-08-31 (×46): 1334 mg via ORAL
  Filled 2018-08-15 (×47): qty 2

## 2018-08-15 NOTE — Progress Notes (Signed)
PROGRESS NOTE  Sandra Sheppard RKY:[id number] DOB: 31-Jul-1945 DOA: 08/08/2018 PCP: Prince Solian, MD  HPI/Recap of past 24 hours:  Urine out 600 cc last 24hrs documented She is edematous, + 10liters, weight is up, she is put on oxygen last night  she c/o feeling weak, she reports prior to this hospitalization, she lives alone and works parttime at [employer] until February this year She is windowed, she has no children, she has sisters and brothers lives close by  Assessment/Plan: Principal Problem:   AKI (acute kidney injury) ([place]) Active Problems:   HYPERTENSION, MILD   CKD (chronic kidney disease) stage 3, GFR 30-59 ml/min ([facility])   Hyperkalemia   Metabolic acidosis, increased anion gap   Normocytic anemia   Diabetes mellitus type II, non insulin dependent ([place])  AKI (acute kidney injury) ([place]) on   CKD (chronic kidney disease) stage 3, GFR 30-59 ml/min ([place]): Baseline creatinine 1.5-2.Concern about ATN,  in the setting of ACE inhibitor and diuretic. On admission of 12.6, renal ultrasound did not show hydronephrosis. spep negative for m spike Cr slow to improved still on IV fluids but has remain oliguric. Now edematous and has o2 requirment Case discussed with nephrology who recommend stop ivf, palliative care consult for goals of care discussion  Generalized edema, more in bilateral arms Will get venous US to r/o DVT   Hyperkalemia: The setting of acute kidney injury and ACE inhibitor now improved. Resolved.  Metabolic acidosis: In the setting of acute kidney injury and metformin use. Improved with bicarbonate supplementation with IV fluids.  Normocytic anemia: Ferritin of 54 hemoglobin of 7.6 MCV of 87. B12 117, will start B12 intramuscular repletion.  Essential hypertension: Hydrochlorothiazide lisinopril have been held. Continue amlodipine 10 mg, blood pressure is fairly controlled.  Diabetes mellitus type II: With an A1c of 6.2. Continue sliding scale  insulin.  Hyperlipidemia: Continue pravastatin.  Acute confusional state: Use Seroquel at bedtime Haldol IV as needed.  GI prophylaxis: Due to significant elevated BUN she was started on Protonix empirically.  DVT prophylaxis: heparin  Code Status: partial  Family Communication: patient   Disposition Plan: not ready to discharge   Consultants:  Nephrology  Palliative care consult per nephrology recommendation   Procedures:  none  Antibiotics:  none   Objective: BP 137/70 (BP Location: Right Arm)   Pulse 75   Temp 97.8 F (36.6 C) (Oral)   Resp 18   Ht 5\' 4"  (1.626 m)   Wt 61.9 kg   SpO2 96%   BMI 23.42 kg/m   Intake/Output Summary (Last 24 hours) at 08/15/2018 1001 Last data filed at 08/15/2018 0900 Gross per 24 hour  Intake 3345.91 ml  Output 650 ml  Net 2695.91 ml   Filed Weights   08/12/18 2100 08/13/18 2124 08/14/18 2032  Weight: 51.8 kg 60.7 kg 61.9 kg    Exam: Patient is examined daily including today on 08/15/2018, exams remain the same as of yesterday except that has changed    General:  Very frail, edematous   Cardiovascular: RRR  Respiratory: diminished at  basis  Abdomen: Soft/ND/NT, positive BS  Musculoskeletal: bilateral upper extremity pitting Edema  Neuro: alert, oriented   Data Reviewed: Basic Metabolic Panel: Recent Labs  Lab 08/11/18 0355 08/12/18 0604 08/13/18 0432 08/14/18 0436 08/15/18 0532  NA 138 138 137 135 137  K 4.2 4.1 4.6 4.2 4.4  CL 97* 88* 89* 91* 96*  CO2 21* 30 30 27  21*  GLUCOSE 109* 108* 117* 118* 98  BUN 91* 86* 86* 83* 78*  CREATININE 11.76* 11.27* 11.87* 11.20* 11.05*  CALCIUM 6.3* 5.6* 5.3* 5.2* 5.7*  PHOS 6.2* 5.7* 6.1* 6.1* 6.0*   Liver Function Tests: Recent Labs  Lab 08/08/18 1753  08/11/18 0355 08/12/18 0604 08/13/18 0432 08/14/18 0436 08/15/18 0532  AST 11*  --   --   --   --   --   --   ALT 6  --   --   --   --   --   --   ALKPHOS 59  --   --   --   --   --   --     BILITOT 0.4  --   --   --   --   --   --   PROT 6.3*  --   --   --   --   --   --   ALBUMIN 3.3*   < > 2.5* 2.4* 2.4* 2.2* 2.5*   < > = values in this interval not displayed.   No results for input(s): LIPASE, AMYLASE in the last 168 hours. No results for input(s): AMMONIA in the last 168 hours. CBC: Recent Labs  Lab 08/08/18 1753 08/09/18 0456 08/09/18 1248 08/10/18 0402 08/15/18 0532  WBC 8.7 6.9  --  5.8 9.8  NEUTROABS 6.2  --   --  4.1  --   HGB 9.2* 8.0*  --  7.6* 8.9*  HCT 29.9* 24.7* 25.4* 23.1* 26.9*  MCV 93.7 90.8  --  87.8 90.0  PLT 166 156  --  135* 131*   Cardiac Enzymes:   No results for input(s): CKTOTAL, CKMB, CKMBINDEX, TROPONINI in the last 168 hours. BNP (last 3 results) No results for input(s): BNP in the last 8760 hours.  ProBNP (last 3 results) No results for input(s): PROBNP in the last 8760 hours.  CBG: Recent Labs  Lab 08/14/18 0740 08/14/18 1140 08/14/18 1641 08/14/18 2033 08/15/18 0734  GLUCAP 109* 122* 114* 129* 101*    No results found for this or any previous visit (from the past 240 hour(s)).   Studies: No results found.  Scheduled Meds: . amLODipine  10 mg Oral Daily  . aspirin EC  81 mg Oral Daily  . calcitRIOL  0.25 mcg Oral Daily  . calcium acetate  667 mg Oral TID WC  . cyanocobalamin  1,000 mcg Intramuscular Daily  . ferrous sulfate  325 mg Oral TID WC  . heparin  5,000 Units Subcutaneous Q8H  . insulin aspart  0-5 Units Subcutaneous QHS  . insulin aspart  0-9 Units Subcutaneous TID WC  . nicotine  21 mg Transdermal Daily  . pantoprazole  40 mg Oral Daily  . pravastatin  10 mg Oral q1800  . sodium chloride flush  3 mL Intravenous Q12H    Continuous Infusions: . sodium chloride Stopped (08/09/18 2346)  . sodium chloride 100 mL/hr at 08/15/18 0901     Time spent: 38mins, case discussed with nephrology I have personally reviewed and interpreted on  08/15/2018 daily labs, tele strips, imagings as discussed above  under date review session and assessment and plans.  I reviewed all nursing notes, pharmacy notes, consultant notes,  vitals, pertinent old records  I have discussed plan of care as described above with RN , patient  on 08/15/2018   Florencia Reasons MD, PhD  [facility] Pager [phone]. If 7PM-7AM, please contact night-coverage at [email or web address], password [facility] 08/15/2018, 10:01 AM  LOS: 6 days

## 2018-08-15 NOTE — Progress Notes (Signed)
Physical Therapy Treatment Patient Details Name: Sandra Sheppard MRN: [id number] DOB: May 28, 1946 Today's Date: 08/15/2018    History of Present Illness Pt is a 73 y/o F with significant PMH of hypertension, type 2 diabetes mellitus, and chronic kidney disease stage III now presenting for evaluation of abnormal outpatient blood work and found to have an acute kidney injury.    PT Comments    Pt progressing towards physical therapy goals. Was able to perform transfers with up to min assist and ambulation with min guard assist and RW for support. 1 seated rest break taken during OOB mobility. We were able to initiate stair training this session, and pt completed 3 stairs with min guard assist and B rails. Will need to be able to negotiate 8 stairs to enter her apartment.   At end of session discussed with pt and family the recommendation that pt not be home alone upon d/c. Feel pt is still at a high risk for falls and occasionally requires assistance for basic transfers. Feel she would benefit from having family stay with her or pt stay with family upon d/c.  Will continue to follow.    Follow Up Recommendations  Home health PT;Supervision for mobility/OOB     Equipment Recommendations  Rolling walker with 5" wheels;3in1 (PT)    Recommendations for Other Services OT consult     Precautions / Restrictions Precautions Precautions: Fall Restrictions Weight Bearing Restrictions: No    Mobility  Bed Mobility Overal bed mobility: Needs Assistance Bed Mobility: Supine to Sit     Supine to sit: Min assist     General bed mobility comments: Assist required for pt to scoot towards EOB, and then to elevate trunk to full sitting position.   Transfers Overall transfer level: Needs assistance Equipment used: Rolling walker (2 wheeled) Transfers: Sit to/from Stand Sit to Stand: Min guard         General transfer comment: Increased time required, but pt able to reach full standing  position without assistance. Hands-on guarding provided for safety.   Ambulation/Gait Ambulation/Gait assistance: +2 safety/equipment;Min guard Gait Distance (Feet): 100 Feet Assistive device: Rolling walker (2 wheeled) Gait Pattern/deviations: Step-through pattern;Decreased stride length Gait velocity: decreased Gait velocity interpretation: <1.8 ft/sec, indicate of risk for recurrent falls General Gait Details: VC's for improved posture and closer walker proximity. Family present and assisting with IV pole and chair follow.    Stairs Stairs: Yes Stairs assistance: Min guard Stair Management: Two rails;Step to pattern;Forwards Number of Stairs: 3(1, then 2) General stair comments: VC's for sequencing and general safety. Limited by length of IV and O2 lines.    Wheelchair Mobility    Modified Rankin (Stroke Patients Only)       Balance Overall balance assessment: Needs assistance Sitting-balance support: Feet supported Sitting balance-Leahy Scale: Fair     Standing balance support: Bilateral upper extremity supported Standing balance-Leahy Scale: Poor                              Cognition Arousal/Alertness: Awake/alert Behavior During Therapy: WFL for tasks assessed/performed Overall Cognitive Status: Impaired/Different from baseline Area of Impairment: Safety/judgement;Following commands                       Following Commands: Follows multi-step commands inconsistently Safety/Judgement: Decreased awareness of safety;Decreased awareness of deficits            Exercises  General Comments        Pertinent Vitals/Pain Pain Assessment: Faces Faces Pain Scale: No hurt Pain Intervention(s): Monitored during session    Home Living                      Prior Function            PT Goals (current goals can now be found in the care plan section) Acute Rehab PT Goals Patient Stated Goal: Pt did not state goals during  session PT Goal Formulation: With patient Time For Goal Achievement: 08/24/18 Potential to Achieve Goals: Good Progress towards PT goals: Progressing toward goals    Frequency    Min 3X/week      PT Plan Current plan remains appropriate    Co-evaluation              AM-PAC PT "6 Clicks" Mobility   Outcome Measure  Help needed turning from your back to your side while in a flat bed without using bedrails?: None Help needed moving from lying on your back to sitting on the side of a flat bed without using bedrails?: A Little Help needed moving to and from a bed to a chair (including a wheelchair)?: A Little Help needed standing up from a chair using your arms (e.g., wheelchair or bedside chair)?: A Lot Help needed to walk in hospital room?: A Little Help needed climbing 3-5 steps with a railing? : A Little 6 Click Score: 18    End of Session Equipment Utilized During Treatment: Gait belt Activity Tolerance: Patient tolerated treatment well Patient left: with call bell/phone within reach;in bed;with bed alarm set;with family/visitor present Nurse Communication: Mobility status PT Visit Diagnosis: Unsteadiness on feet (R26.81);Difficulty in walking, not elsewhere classified (R26.2)     Time: 0962-8366 PT Time Calculation (min) (ACUTE ONLY): 40 min  Charges:  $Gait Training: 38-52 mins                     Rolinda Roan, PT, DPT Acute Rehabilitation Services Pager: [phone] Office: [phone]    [name] 08/15/2018, 1:43 PM

## 2018-08-15 NOTE — Progress Notes (Signed)
[facility] NEPHROLOGY PROGRESS NOTE  Assessment/ Plan: Pt is a 73 y.o. yo female with history of hypertension, diabetes, CKD stage III admitted with abnormal lab results.  #AKI on CKD stage III: Baseline creatinine around 1.5-2, presented with nausea vomiting diarrhea and the lab taken at PCP office were abnormal.  On admission patient had BUN 99, creatinine 12.67 and CO2 of 11.  It is likely ischemic ATN in the setting of concomitant use of lisinopril/hydrochlorothiazide and metformin.  The urinalysis with no RBC or WBC but has protein 100.  Doubt AIN.  -urine PCR 1.6g, k/lamda ratio 2.38. -Ultrasound kidneys with increased echogenicity but no obstruction. -Patient is nonoliguric however serum creatinine level is fairly slow to improve.  Creatinine level is 11.05 today.  She does not have uremic features.  I had a long discussion with the patient and her sisters today regarding slow recovery of her kidney function and possible need for dialysis if no improvement or associated with any uremic symptoms.  For now continue IV fluid and monitor kidney function. Lower IVF rate today.  Recommend palliative care consult to discuss goals of care.    #Metabolic acidosis corrected with bicarbonate.  #Hyperkalemia: Improved  #Anemia: K/lamda ratio acceptable for renal failure patient.  Iron saturation 29.   received aranesp on 3/2.  #Secondary hyperparathyroidism PTH 204.  Increase the dose of PhosLo.  Continue calcitriol and replete calcium gluconate.  Discussed with primary team  Subjective: Seen and examined at bedside.  No new event, no nausea, vomiting, chest pain, shortness of breath.  Objective Vital signs in last 24 hours: Vitals:   08/14/18 2050 08/14/18 2050 08/15/18 0339 08/15/18 0826  BP:   (!) 129/57 137/70  Pulse: 72 72 70 75  Resp:   19 18  Temp:   97.9 F (36.6 C) 97.8 F (36.6 C)  TempSrc:   Oral Oral  SpO2: 94% 94% 92% 96%  Weight:      Height:        Weight change: 1.2 kg  Intake/Output Summary (Last 24 hours) at 08/15/2018 1350 Last data filed at 08/15/2018 0900 Gross per 24 hour  Intake 3045.91 ml  Output 650 ml  Net 2395.91 ml       Labs: Basic Metabolic Panel: Recent Labs  Lab 08/13/18 0432 08/14/18 0436 08/15/18 0532  NA 137 135 137  K 4.6 4.2 4.4  CL 89* 91* 96*  CO2 30 27 21*  GLUCOSE 117* 118* 98  BUN 86* 83* 78*  CREATININE 11.87* 11.20* 11.05*  CALCIUM 5.3* 5.2* 5.7*  PHOS 6.1* 6.1* 6.0*   Liver Function Tests: Recent Labs  Lab 08/08/18 1753  08/13/18 0432 08/14/18 0436 08/15/18 0532  AST 11*  --   --   --   --   ALT 6  --   --   --   --   ALKPHOS 59  --   --   --   --   BILITOT 0.4  --   --   --   --   PROT 6.3*  --   --   --   --   ALBUMIN 3.3*   < > 2.4* 2.2* 2.5*   < > = values in this interval not displayed.   No results for input(s): LIPASE, AMYLASE in the last 168 hours. No results for input(s): AMMONIA in the last 168 hours. CBC: Recent Labs  Lab 08/08/18 1753 08/09/18 0456 08/09/18 1248 08/10/18 0402 08/15/18 0532  WBC 8.7 6.9  --  5.8 9.8  NEUTROABS 6.2  --   --  4.1  --   HGB 9.2* 8.0*  --  7.6* 8.9*  HCT 29.9* 24.7* 25.4* 23.1* 26.9*  MCV 93.7 90.8  --  87.8 90.0  PLT 166 156  --  135* 131*   Cardiac Enzymes: No results for input(s): CKTOTAL, CKMB, CKMBINDEX, TROPONINI in the last 168 hours. CBG: Recent Labs  Lab 08/14/18 1140 08/14/18 1641 08/14/18 2033 08/15/18 0734 08/15/18 1124  GLUCAP 122* 114* 129* 101* 97    Iron Studies: No results for input(s): IRON, TIBC, TRANSFERRIN, FERRITIN in the last 72 hours. Studies/Results: No results found.  Medications: Infusions: . sodium chloride Stopped (08/09/18 2346)  . sodium chloride 100 mL/hr at 08/15/18 0901    Scheduled Medications: . amLODipine  10 mg Oral Daily  . aspirin EC  81 mg Oral Daily  . calcitRIOL  0.25 mcg Oral Daily  . calcium acetate  667 mg Oral TID WC  . cyanocobalamin  1,000 mcg  Intramuscular Daily  . ferrous sulfate  325 mg Oral TID WC  . heparin  5,000 Units Subcutaneous Q8H  . insulin aspart  0-5 Units Subcutaneous QHS  . insulin aspart  0-9 Units Subcutaneous TID WC  . nicotine  21 mg Transdermal Daily  . pantoprazole  40 mg Oral Daily  . pravastatin  10 mg Oral q1800  . sodium chloride flush  3 mL Intravenous Q12H    have reviewed scheduled and prn medications.  Physical Exam: General:NAD, lying in bed comfortable Heart:RRR, s1s2 nl no rubs Lungs: Clear bilateral, no wheezing Abdomen:soft, Non-tender, non-distended Extremities: No edema Neurology: Alert, awake, following commands, no asterixis.  Dron Prasad Bhandari 08/15/2018,1:50 PM  LOS: 6 days

## 2018-08-15 NOTE — Progress Notes (Signed)
CRITICAL VALUE ALERT  Critical Value:  Calcium 5.7  Date & Time Notied:  08/15/2018 @ 8984  Provider Notified: Kirby,NP @ 08/15/2018 06:55  Orders Received/Actions taken: No orders received.

## 2018-08-16 ENCOUNTER — Inpatient Hospital Stay (HOSPITAL_COMMUNITY): Payer: Medicare HMO

## 2018-08-16 DIAGNOSIS — N189 Chronic kidney disease, unspecified: Secondary | ICD-10-CM

## 2018-08-16 DIAGNOSIS — N170 Acute kidney failure with tubular necrosis: Secondary | ICD-10-CM

## 2018-08-16 DIAGNOSIS — R609 Edema, unspecified: Secondary | ICD-10-CM

## 2018-08-16 DIAGNOSIS — N183 Chronic kidney disease, stage 3 unspecified: Secondary | ICD-10-CM

## 2018-08-16 DIAGNOSIS — N179 Acute kidney failure, unspecified: Secondary | ICD-10-CM

## 2018-08-16 DIAGNOSIS — Z515 Encounter for palliative care: Secondary | ICD-10-CM

## 2018-08-16 LAB — RENAL FUNCTION PANEL
Albumin: 2 g/dL — ABNORMAL LOW (ref 3.5–5.0)
Anion gap: 17 — ABNORMAL HIGH (ref 5–15)
BUN: 76 mg/dL — ABNORMAL HIGH (ref 8–23)
CO2: 23 mmol/L (ref 22–32)
Calcium: 6.1 mg/dL — CL (ref 8.9–10.3)
Chloride: 97 mmol/L — ABNORMAL LOW (ref 98–111)
Creatinine, Ser: 10.83 mg/dL — ABNORMAL HIGH (ref 0.44–1.00)
GFR calc Af Amer: 4 mL/min — ABNORMAL LOW (ref >60)
GFR calc non Af Amer: 3 mL/min — ABNORMAL LOW (ref >60)
Glucose, Bld: 112 mg/dL — ABNORMAL HIGH (ref 70–99)
Phosphorus: 5.6 mg/dL — ABNORMAL HIGH (ref 2.5–4.6)
Potassium: 4.2 mmol/L (ref 3.5–5.1)
Sodium: 137 mmol/L (ref 135–145)

## 2018-08-16 LAB — GLUCOSE, CAPILLARY
Glucose-Capillary: 103 mg/dL — ABNORMAL HIGH (ref 70–99)
Glucose-Capillary: 134 mg/dL — ABNORMAL HIGH (ref 70–99)
Glucose-Capillary: 119 mg/dL — ABNORMAL HIGH (ref 70–99)
Glucose-Capillary: 139 mg/dL — ABNORMAL HIGH (ref 70–99)

## 2018-08-16 MED ORDER — CALCIUM GLUCONATE-NACL 1-0.675 GM/50ML-% IV SOLN
1.0000 g | Freq: Once | INTRAVENOUS | Status: AC
  Administered 2018-08-16: 1000 mg via INTRAVENOUS
  Filled 2018-08-16: qty 50

## 2018-08-16 MED ORDER — VITAMIN B-12 1000 MCG PO TABS
1000.0000 ug | ORAL_TABLET | Freq: Every day | ORAL | Status: DC
  Administered 2018-08-17 – 2018-08-31 (×15): 1000 ug via ORAL
  Filled 2018-08-16 (×15): qty 1

## 2018-08-16 NOTE — Progress Notes (Signed)
[facility] NEPHROLOGY PROGRESS NOTE  Assessment/ Plan: Pt is a 73 y.o. yo female with history of hypertension, diabetes, CKD stage III admitted with abnormal lab results.  #AKI on CKD stage III: Baseline creatinine around 1.5-2, presented with nausea vomiting diarrhea and the lab taken at PCP office were abnormal.  On admission patient had BUN 99, creatinine 12.67 and CO2 of 11.  It is likely ischemic ATN in the setting of concomitant use of lisinopril/hydrochlorothiazide and metformin.  The urinalysis with no RBC or WBC but has protein 100.  Doubt AIN.  -urine PCR 1.6g, k/lamda ratio 2.38. -Ultrasound kidneys with increased echogenicity but no obstruction. -Patient is nonoliguric however serum creatinine level is fairly slow to improve.  Creatinine level is 10.83 today.  She does not have uremic features.  Discontinue IV fluid because of some shortness of breath.  Encourage oral intake.  Today I discussed with another sister and patient's brother regarding weak kidney function and slow recovery.  Continue to watch for renal recovery.  Noted palliative care is meeting with the family today.   #Metabolic acidosis corrected with bicarbonate.  #Hyperkalemia: Improved  #Anemia: K/lamda ratio acceptable for renal failure patient.  Iron saturation 29.   received aranesp on 3/2.  #Secondary hyperparathyroidism PTH 204.  Increased the dose of PhosLo.  Continue calcitriol.  Corrected calcium 7.6, repleted calcium gluconate.  Discussed with primary team  Subjective: Seen and examined at bedside.  No new event.  She is off of IV fluid.  Encourage oral intake.  Requiring oxygen.  Denied nausea, vomiting, chest pain, headache or dizziness.  Family members at bedside. Objective Vital signs in last 24 hours: Vitals:   08/15/18 2130 08/16/18 0355 08/16/18 0500 08/16/18 0832  BP: (!) 121/55 (!) 126/54  (!) 136/55  Pulse: 78 72  73  Resp: 17 18  18   Temp: 98.7 F (37.1 C) 98.5 F (36.9  C)  98.5 F (36.9 C)  TempSrc: Oral Oral  Oral  SpO2: 92% 94%  96%  Weight: 60.9 kg  60.9 kg   Height:       Weight change: -1 kg  Intake/Output Summary (Last 24 hours) at 08/16/2018 1153 Last data filed at 08/16/2018 0600 Gross per 24 hour  Intake 900 ml  Output 650 ml  Net 250 ml       Labs: Basic Metabolic Panel: Recent Labs  Lab 08/14/18 0436 08/15/18 0532 08/16/18 0501  NA 135 137 137  K 4.2 4.4 4.2  CL 91* 96* 97*  CO2 27 21* 23  GLUCOSE 118* 98 112*  BUN 83* 78* 76*  CREATININE 11.20* 11.05* 10.83*  CALCIUM 5.2* 5.7* 6.1*  PHOS 6.1* 6.0* 5.6*   Liver Function Tests: Recent Labs  Lab 08/14/18 0436 08/15/18 0532 08/16/18 0501  ALBUMIN 2.2* 2.5* 2.0*   No results for input(s): LIPASE, AMYLASE in the last 168 hours. No results for input(s): AMMONIA in the last 168 hours. CBC: Recent Labs  Lab 08/09/18 1248 08/10/18 0402 08/15/18 0532  WBC  --  5.8 9.8  NEUTROABS  --  4.1  --   HGB  --  7.6* 8.9*  HCT 25.4* 23.1* 26.9*  MCV  --  87.8 90.0  PLT  --  135* 131*   Cardiac Enzymes: No results for input(s): CKTOTAL, CKMB, CKMBINDEX, TROPONINI in the last 168 hours. CBG: Recent Labs  Lab 08/15/18 1124 08/15/18 1713 08/15/18 2132 08/16/18 0829 08/16/18 1149  GLUCAP 97 106* 169* 103* 134*  Iron Studies: No results for input(s): IRON, TIBC, TRANSFERRIN, FERRITIN in the last 72 hours. Studies/Results: No results found.  Medications: Infusions: . sodium chloride Stopped (08/09/18 2346)    Scheduled Medications: . amLODipine  10 mg Oral Daily  . aspirin EC  81 mg Oral Daily  . calcitRIOL  0.25 mcg Oral Daily  . calcium acetate  1,334 mg Oral TID WC  . ferrous sulfate  325 mg Oral TID WC  . heparin  5,000 Units Subcutaneous Q8H  . insulin aspart  0-5 Units Subcutaneous QHS  . insulin aspart  0-9 Units Subcutaneous TID WC  . nicotine  21 mg Transdermal Daily  . pantoprazole  40 mg Oral Daily  . pravastatin  10 mg Oral q1800  . sodium  chloride flush  3 mL Intravenous Q12H    have reviewed scheduled and prn medications.  Physical Exam: General: Not in distress, sitting on chair comfortable Heart:RRR, s1s2 nl no rubs Lungs: Bibasal decreased breath sound, no wheezing, no increased work of breathing Abdomen:soft, Non-tender, non-distended Extremities: No lower extremity edema. Neurology: Alert, awake, following commands, no asterixis.  [place] 08/16/2018,11:53 AM  LOS: 7 days

## 2018-08-16 NOTE — Progress Notes (Signed)
Family meeting with PMT scheduled for 4:00 today.  Florentina Jenny, PA-C Palliative Medicine Pager: [phone]

## 2018-08-16 NOTE — Progress Notes (Signed)
VASCULAR LAB PRELIMINARY  PRELIMINARY  PRELIMINARY  PRELIMINARY  Bilateral upper extremity venous duplex completed.    Preliminary report:  See CV Proc for results  Sharion Dove, RVT 08/16/2018, 2:49 PM

## 2018-08-16 NOTE — Consult Note (Addendum)
Consultation Note Date: 08/16/2018   Patient Name: Sandra Sheppard  DOB: April 08, 1946  MRN: [id number]  Age / Sex: 73 y.o., female  PCP: Sandra Solian, MD Referring Physician: Florencia Reasons, MD  Reason for Consultation: Establishing goals of care in light of worsening renal function  HPI/Patient Profile: 73 y.o. female  with past medical history of AS, MS, PVD, AAA, Afib, DM, CKD III who was admitted on 08/08/2018 with acute on chronic renal failure with a creatinine of 12.  Nephrology was consulted and felt the acute renal failure may have been due to concomitant use of lisinopril, HCTZ, and metformin.  Since admission her kidney function has been slow to improve.  Her volume status has worsened and she has developed mild respiratory distress requiring oxygen.  Clinical Assessment and Goals of Care:  I have reviewed medical records including EPIC notes, labs and imaging, received report from Dr. Erlinda Sheppard , assessed the patient and then met at the bedside along with two of her sisters and her niece  to discuss diagnosis prognosis, GOC, EOL wishes, disposition and options..  The patient has 6 siblings who live close by and are supportive.  Her youngest sibling Sandra Sheppard is her 30.   I introduced Palliative Medicine as specialized medical care for people living with serious illness. It focuses on providing relief from the symptoms and stress of a serious illness. The goal is to improve quality of life for both the patient and the family.  We discussed a brief life review of the patient.  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  Sandra Sheppard lives alone with her dog, Sandra Sheppard.  She is a very independent person, and according to her sister stubborn.  Sandra Sheppard states "you just get out of bed and keep going ".  Her illness was quite shocking to her  as she feels she has always been healthy.  She feels that she had no warning she was so ill.  She does state that over the past 6 weeks she became progressively more tired and did not want to eat or drink.  Finally when she was so tired that she could not get off the couch she sought out medical help.  We discussed her current illness and what it means in the larger context of her on-going co-morbidities.  Natural disease trajectory and expectations at EOL were discussed.  Sandra Sheppard was sensitive to our conversation.  Her sisters being present was a great help.  Sandra Sheppard truly did not want to discuss how ill she was and certainly did not want to discuss end-of-life.  However we did broach the subject of dialysis.  Sandra Sheppard is not inclined towards life support of any type.  Her advance directive indicates that she would not want life support near end-of-life and she would not want a feeding tube.  Sandra Sheppard states "I am ready when the good Sandra Sheppard decides to take me ".  She does believe in life ever lasting.  However with regard to the specific subject of dialysis [name] is still undecided.  She is hopeful that she will not have to make that decision.  Her sisters reveal that Sandra Sheppard is a chain smoker at home.  They are concerned that she needs physical therapy to gain her strength.  They fear that if she goes home without good physical therapy she will simply sit on the couch and smoke.  Her HC POA Sandra Sheppard and I stepped outside the room for a moment I explained to Sandra Sheppard that given Sandra Sheppard's cardiac valvular disease, and her peripheral vascular disease-dialysis would likely not improve her quality of life.  We also discussed CODE STATUS.  Currently Sandra Sheppard is a partial code (DNI).  I explained to Sandra Sheppard that  CPR on a person with an abdominal aortic aneurysm and advanced kidney disease is very unlikely to be successful.  Sandra Sheppard agreed.  The 2 of [place] decided not to push this information on the patient at this point in time as she is already  depressed.  Advanced directives, concepts specific to code status, artifical feeding and hydration, and rehospitalization were considered and discussed.  Sandra Sheppard has made it clear and her advanced directive that she does not want life support near end-of-life she does not want artificial feeding and hydration.  At this point she would want rehospitalization.    Primary Decision Maker:  PATIENT with help from her sisters particularly Sandra Sheppard her [facility] POA    SUMMARY OF RECOMMENDATIONS    PMT will chart check while she is admitted.  If the patient's condition does not improve in the next 2 days we will follow-up in person.  If we are needed more urgently please call our office.  Family requests patient be up out of bed more frequently.  They are happy to assist her walking in the halls if she is able to have portable oxygen (or come off the oxygen).  Patient is a chain smoker and would benefit from tobacco cessation education.  (I do not know if we still offer that formally)  Family and patient request that she be evaluated for CIR.  Patient does not want to go to SNF, but is willing to stay for intensive physical therapy in the hospital.   Code Status/Advance Care Planning:  Partial code (DNI).  This was not discussed with the patient today.  She does state she would not want any form of life support at end of life.  She does not want artificial feeding or hydration.   Symptom Management:   Continue current management  Additional Recommendations (Limitations, Scope, Preferences):  Full Scope Treatment  Psycho-social/Spiritual:   Desire for further Chaplaincy support: No discussed.  Patient does believe in God and life everlasting.  Prognosis:  Difficult to determine at this point.  If her kidneys improve she likely has months to years.   Discharge Planning: To Be Determined  Requesting CIR evaluation at the appropriate time.      Primary Diagnoses: Present on Admission: . AKI  (acute kidney injury) ([place]) . Hyperkalemia . Metabolic acidosis, increased anion gap . HYPERTENSION, MILD . Normocytic anemia . CKD (chronic kidney disease) stage 3, GFR 30-59 ml/min (HCC)   I have reviewed the medical record, interviewed the patient and family, and examined the patient. The following aspects are pertinent.  Past Medical History:  Diagnosis Date  . ABDOMINAL AORTIC ANEURYSM   . CAROTID STENOSIS   . FIBRILLATION, ATRIAL   .  HYPERCHOLESTEROLEMIA  IIA   . HYPERTENSION, MILD   . MURMUR   . PERIPHERAL VASCULAR DISEASE   . STENOSIS, MITRAL AND AORTIC VALVES    Social History   Socioeconomic History  . Marital status: Widowed    Spouse name: Not on file  . Number of children: Not on file  . Years of education: Not on file  . Highest education level: Not on file  Occupational History  . Not on file  Social Needs  . Financial resource strain: Not on file  . Food insecurity:    Worry: Not on file    Inability: Not on file  . Transportation needs:    Medical: Not on file    Non-medical: Not on file  Tobacco Use  . Smoking status: Former Research scientist (life sciences)  . Smokeless tobacco: Never Used  Substance and Sexual Activity  . Alcohol use: No  . Drug use: Not on file  . Sexual activity: Not on file  Lifestyle  . Physical activity:    Days per week: Not on file    Minutes per session: Not on file  . Stress: Not on file  Relationships  . Social connections:    Talks on phone: Not on file    Gets together: Not on file    Attends religious service: Not on file    Active member of club or organization: Not on file    Attends meetings of clubs or organizations: Not on file    Relationship status: Not on file  Other Topics Concern  . Not on file  Social History Narrative  . Not on file   Family History  Problem Relation Age of Onset  . Colon polyps Other    Scheduled Meds: . amLODipine  10 mg Oral Daily  . aspirin EC  81 mg Oral Daily  . calcitRIOL  0.25 mcg Oral  Daily  . calcium acetate  1,334 mg Oral TID WC  . ferrous sulfate  325 mg Oral TID WC  . heparin  5,000 Units Subcutaneous Q8H  . insulin aspart  0-5 Units Subcutaneous QHS  . insulin aspart  0-9 Units Subcutaneous TID WC  . nicotine  21 mg Transdermal Daily  . pantoprazole  40 mg Oral Daily  . pravastatin  10 mg Oral q1800  . sodium chloride flush  3 mL Intravenous Q12H   Continuous Infusions: . sodium chloride Stopped (08/09/18 2346)   PRN Meds:.sodium chloride, acetaminophen **OR** acetaminophen, haloperidol lactate, ondansetron **OR** ondansetron (ZOFRAN) IV Allergies  Allergen Reactions  . Codeine     Rash, Nausea and Vomiting   . Sulfonamide Derivatives   . Penicillins Rash    Has patient had a PCN reaction causing immediate rash, facial/tongue/throat swelling, SOB or lightheadedness with hypotension: YES Has patient had a PCN reaction causing severe rash involving mucus membranes or skin necrosis:NO Has patient had a PCN reaction that required hospitalization NO Has patient had a PCN reaction occurring within the last 10 years: NO If all of the above answers are "NO", then may proceed with Cephalosporin use.   Review of Systems reports fatigue, new tremor, new difficulty breathing, decreased appetite  Physical Exam well-developed female Awake, alert, coherent, with clear speech Upper extremities 3+ edema, lower extremities trace edema Notable resting tremor in upper extremities  Vital Signs: BP (!) 136/55 (BP Location: Right Arm)   Sheppard 72   Temp 98.5 F (36.9 C) (Oral)   Resp 18   Ht '5\' 4"'  (1.626 m)  Wt 60.9 kg   SpO2 96%   BMI 23.05 kg/m  Pain Scale: 0-10   Pain Score: 0-No pain   SpO2: SpO2: 96 % O2 Device:SpO2: 96 % O2 Flow Rate: .O2 Flow Rate (L/min): 3 L/min  IO: Intake/output summary:   Intake/Output Summary (Last 24 hours) at 08/16/2018 1537 Last data filed at 08/16/2018 1432 Gross per 24 hour  Intake 600 ml  Output 900 ml  Net -300 ml     LBM: Last BM Date: 08/14/18 Baseline Weight: Weight: 51.7 kg Most recent weight: Weight: 60.9 kg     Palliative Assessment/Data: 40%   Flowsheet Rows     Most Recent Value  Intake Tab  Referral Department  Hospitalist  Unit at Time of Referral  Med/Surg Unit  Palliative Care Primary Diagnosis  Nephrology  Date Notified  08/15/18  Palliative Care Type  New Palliative care  Reason for referral  Clarify Goals of Care  Date of Admission  08/08/18  # of days IP prior to Palliative referral  7  Clinical Assessment  Psychosocial & Spiritual Assessment  Palliative Care Outcomes      Time In: 4:00 Time Out: 5:10  Time Total: 70 minutes Greater than 50%  of this time was spent counseling and coordinating care related to the above assessment and plan.  Signed by: Florentina Jenny, PA-C Palliative Medicine Pager: [phone]  Please contact Palliative Medicine Team phone at [phone] for questions and concerns.  For individual provider: See Shea Evans

## 2018-08-16 NOTE — Progress Notes (Signed)
PROGRESS NOTE  Sandra Sheppard SNK:[id number] DOB: 1945-12-09 DOA: 08/08/2018 PCP: Prince Solian, MD  HPI/Recap of past 24 hours:  Urine out 800 cc last 24hrs documented She is edematous, + 10liters, weight is up, she is put on oxygen since 3/3 night She denies pain, denies sob, she is off ivf since 3/4   she c/o feeling weak,  she reports prior to this hospitalization, she lives alone and works parttime at [employer] until February this year She is windowed, she has no children, she has sisters and brothers lives close by  Assessment/Plan: Principal Problem:   AKI (acute kidney injury) ([place]) Active Problems:   HYPERTENSION, MILD   CKD (chronic kidney disease) stage 3, GFR 30-59 ml/min (HCC)   Hyperkalemia   Metabolic acidosis, increased anion gap   Normocytic anemia   Diabetes mellitus type II, non insulin dependent (HCC)   Hypocalcemia   FTT (failure to thrive) in adult  AKI (acute kidney injury) ([place]) on   CKD (chronic kidney disease) stage 3, GFR 30-59 ml/min ([place]): Baseline creatinine 1.5-2.Concern about ATN,  in the setting of ACE inhibitor and diuretic. On admission of 12.6, renal ultrasound did not show hydronephrosis. spep negative for m spike Cr slow to improved still on IV fluids but has remain oliguric. Now edematous and has o2 requirement, ivf stopped on 3/4 Case discussed with nephrology and palliative care today  Generalized edema, more in bilateral arms venous US negative for DVT   Hyperkalemia: The setting of acute kidney injury and ACE inhibitor now improved. Resolved.  Metabolic acidosis: In the setting of acute kidney injury and metformin use. Improved with bicarbonate supplementation with IV fluids.  Normocytic anemia: Ferritin of 54 hemoglobin of 7.6 MCV of 87. B12 117,  S/p im B12 supplementx7 days, now on oral b12 supplement  Essential hypertension: Hydrochlorothiazide lisinopril have been held. Continue amlodipine 10 mg, blood pressure is  fairly controlled.  Diabetes mellitus type II: With an A1c of 6.2. Continue sliding scale insulin.  Hyperlipidemia: Continue pravastatin.  Acute confusional state: Seroquel at bedtime prn , Haldol IV as needed. Last dose of 2/29 Appear has improved, has been calm and cooperative without needing any prn meds  GI prophylaxis: Due to significant elevated BUN she was started on Protonix empirically.  DVT prophylaxis: heparin  Code Status: partial  Family Communication: patient   Disposition Plan: not ready to discharge   Consultants:  Nephrology  Palliative care consult per nephrology recommendation   Procedures:  none  Antibiotics:  none   Objective: BP (!) 136/55 (BP Location: Right Arm)   Pulse 73   Temp 98.5 F (36.9 C) (Oral)   Resp 18   Ht 5\' 4"  (1.626 m)   Wt 60.9 kg   SpO2 96%   BMI 23.05 kg/m   Intake/Output Summary (Last 24 hours) at 08/16/2018 1030 Last data filed at 08/16/2018 0600 Gross per 24 hour  Intake 900 ml  Output 650 ml  Net 250 ml   Filed Weights   08/14/18 2032 08/15/18 2130 08/16/18 0500  Weight: 61.9 kg 60.9 kg 60.9 kg    Exam: Patient is examined daily including today on 08/16/2018, exams remain the same as of yesterday except that has changed    General:  Very frail, edematous   Cardiovascular: RRR  Respiratory: diminished at  basis  Abdomen: Soft/ND/NT, positive BS  Musculoskeletal: bilateral upper extremity pitting Edema  Neuro: alert, oriented   Data Reviewed: Basic Metabolic Panel: Recent Labs  Lab 08/12/18  4268 08/13/18 0432 08/14/18 0436 08/15/18 0532 08/16/18 0501  NA 138 137 135 137 137  K 4.1 4.6 4.2 4.4 4.2  CL 88* 89* 91* 96* 97*  CO2 30 30 27  21* 23  GLUCOSE 108* 117* 118* 98 112*  BUN 86* 86* 83* 78* 76*  CREATININE 11.27* 11.87* 11.20* 11.05* 10.83*  CALCIUM 5.6* 5.3* 5.2* 5.7* 6.1*  PHOS 5.7* 6.1* 6.1* 6.0* 5.6*   Liver Function Tests: Recent Labs  Lab 08/12/18 0604  08/13/18 0432 08/14/18 0436 08/15/18 0532 08/16/18 0501  ALBUMIN 2.4* 2.4* 2.2* 2.5* 2.0*   No results for input(s): LIPASE, AMYLASE in the last 168 hours. No results for input(s): AMMONIA in the last 168 hours. CBC: Recent Labs  Lab 08/09/18 1248 08/10/18 0402 08/15/18 0532  WBC  --  5.8 9.8  NEUTROABS  --  4.1  --   HGB  --  7.6* 8.9*  HCT 25.4* 23.1* 26.9*  MCV  --  87.8 90.0  PLT  --  135* 131*   Cardiac Enzymes:   No results for input(s): CKTOTAL, CKMB, CKMBINDEX, TROPONINI in the last 168 hours. BNP (last 3 results) No results for input(s): BNP in the last 8760 hours.  ProBNP (last 3 results) No results for input(s): PROBNP in the last 8760 hours.  CBG: Recent Labs  Lab 08/15/18 0734 08/15/18 1124 08/15/18 1713 08/15/18 2132 08/16/18 0829  GLUCAP 101* 97 106* 169* 103*    No results found for this or any previous visit (from the past 240 hour(s)).   Studies: No results found.  Scheduled Meds: . amLODipine  10 mg Oral Daily  . aspirin EC  81 mg Oral Daily  . calcitRIOL  0.25 mcg Oral Daily  . calcium acetate  1,334 mg Oral TID WC  . ferrous sulfate  325 mg Oral TID WC  . heparin  5,000 Units Subcutaneous Q8H  . insulin aspart  0-5 Units Subcutaneous QHS  . insulin aspart  0-9 Units Subcutaneous TID WC  . nicotine  21 mg Transdermal Daily  . pantoprazole  40 mg Oral Daily  . pravastatin  10 mg Oral q1800  . sodium chloride flush  3 mL Intravenous Q12H    Continuous Infusions: . sodium chloride Stopped (08/09/18 2346)     Time spent: 25mins, case discussed with nephrology and palliative care I have personally reviewed and interpreted on  08/16/2018 daily labs, tele strips, imagings as discussed above under date review session and assessment and plans.  I reviewed all nursing notes, pharmacy notes, consultant notes,  vitals, pertinent old records  I have discussed plan of care as described above with RN , patient  on 08/16/2018   Florencia Reasons MD,  PhD  [facility] Pager [phone]. If 7PM-7AM, please contact night-coverage at [email or web address], password [facility] 08/16/2018, 10:30 AM  LOS: 7 days

## 2018-08-16 NOTE — Progress Notes (Signed)
K.Kirby texted calcium level of 6.1. This is up from 5.7 yesterday.

## 2018-08-16 NOTE — Evaluation (Signed)
Occupational Therapy Evaluation Patient Details Name: Sandra Sheppard MRN: [id number] DOB: [birth year] Today's Date: 08/16/2018    History of Present Illness Pt is a 73 y/o F with significant PMH of hypertension, type 2 diabetes mellitus, and chronic kidney disease stage III now presenting for evaluation of abnormal outpatient blood work and found to have an acute kidney injury.   Clinical Impression   Pt with decline in function and safety with ADLs and ADL mobility with decreased strength, balance, endurance and cognition. Pt require multimodal cues for initiation of tasks with increased time. Pt is l at a high risk for falls and occasionally requires assist for ADL transfers. It would be beneficial to have family provide 24/7 sup initially after acute d/c. Pt would benefit from acute OT services to address impairments to maximize level of function and safety  Follow Up Recommendations  Home health OT;Supervision/Assistance - 24 hour    Equipment Recommendations  Tub/shower bench    Recommendations for Other Services       Precautions / Restrictions Precautions Precautions: Fall Restrictions Weight Bearing Restrictions: No      Mobility Bed Mobility               General bed mobility comments: pt in recliner upon arrival  Transfers Overall transfer level: Needs assistance Equipment used: Rolling walker (2 wheeled) Transfers: Sit to/from Stand Sit to Stand: Min guard         General transfer comment: increased time required, multiodal cues for correct hand placement and technique    Balance Overall balance assessment: Needs assistance Sitting-balance support: Feet supported Sitting balance-Leahy Scale: Fair     Standing balance support: Single extremity supported;Bilateral upper extremity supported;During functional activity Standing balance-Leahy Scale: Poor Standing balance comment: Pt stood at sink for grooming tasks                           ADL  either performed or assessed with clinical judgement   ADL Overall ADL's : Needs assistance/impaired Eating/Feeding: Set up;Sitting   Grooming: Wash/dry hands;Wash/dry face;Brushing hair;Standing;Min guard   Upper Body Bathing: Set up;Supervision/ safety;Sitting   Lower Body Bathing: Moderate assistance;Sit to/from stand   Upper Body Dressing : Set up;Supervision/safety;Sitting   Lower Body Dressing: Moderate assistance   Toilet Transfer: Min guard;Ambulation;RW;Comfort height toilet;Cueing for safety;Cueing for sequencing   Toileting- Clothing Manipulation and Hygiene: Minimal assistance;Sit to/from stand       Functional mobility during ADLs: Min guard;Cueing for safety;Cueing for sequencing;Rolling walker       Vision Patient Visual Report: No change from baseline       Perception     Praxis      Pertinent Vitals/Pain Pain Assessment: No/denies pain Faces Pain Scale: No hurt Pain Intervention(s): Monitored during session     Hand Dominance Left   Extremity/Trunk Assessment Upper Extremity Assessment Upper Extremity Assessment: Generalized weakness   Lower Extremity Assessment Lower Extremity Assessment: Defer to PT evaluation   Cervical / Trunk Assessment Cervical / Trunk Assessment: Normal   Communication Communication Communication: No difficulties   Cognition Arousal/Alertness: Awake/alert Behavior During Therapy: WFL for tasks assessed/performed Overall Cognitive Status: Impaired/Different from baseline Area of Impairment: Safety/judgement;Following commands;Memory;Problem solving                       Following Commands: Follows multi-step commands inconsistently Safety/Judgement: Decreased awareness of safety;Decreased awareness of deficits   Problem Solving: Slow processing;Difficulty sequencing;Requires verbal cues  General Comments       Exercises     Shoulder Instructions      Home Living Family/patient expects to be  discharged to:: Private residence Living Arrangements: Alone Available Help at Discharge: Family;Available PRN/intermittently Type of Home: Apartment Home Access: Stairs to enter Entrance Stairs-Number of Steps: 8   Home Layout: Two level Alternate Level Stairs-Number of Steps: 4   Bathroom Shower/Tub: Teacher, early years/pre: Handicapped height     Home Equipment: None          Prior Functioning/Environment Level of Independence: Independent                 OT Problem List: Decreased strength;Decreased activity tolerance;Decreased cognition;Decreased knowledge of use of DME or AE;Impaired balance (sitting and/or standing);Decreased safety awareness      OT Treatment/Interventions: Self-care/ADL training;DME and/or AE instruction;Therapeutic activities;Patient/family education    OT Goals(Current goals can be found in the care plan section) Acute Rehab OT Goals Patient Stated Goal: go home OT Goal Formulation: With patient Time For Goal Achievement: 08/30/18 Potential to Achieve Goals: Good ADL Goals Pt Will Perform Grooming: with supervision;with set-up;with modified independence;standing Pt Will Perform Upper Body Bathing: with set-up;with modified independence Pt Will Perform Lower Body Bathing: with min assist;with min guard assist;sit to/from stand Pt Will Perform Upper Body Dressing: with set-up;with modified independence Pt Will Perform Lower Body Dressing: with min assist;with min guard assist;sit to/from stand Pt Will Transfer to Toilet: with supervision;with modified independence;ambulating Pt Will Perform Toileting - Clothing Manipulation and hygiene: with min guard assist;with supervision;sit to/from stand Pt Will Perform Tub/Shower Transfer: with supervision;with modified independence;tub bench;rolling walker;ambulating  OT Frequency: Min 2X/week   Barriers to D/C: Decreased caregiver support  will need 24/7 sup initially        Co-evaluation              AM-PAC OT "6 Clicks" Daily Activity     Outcome Measure Help from another person eating meals?: None Help from another person taking care of personal grooming?: A Little Help from another person toileting, which includes using toliet, bedpan, or urinal?: A Little Help from another person bathing (including washing, rinsing, drying)?: A Lot Help from another person to put on and taking off regular upper body clothing?: A Little Help from another person to put on and taking off regular lower body clothing?: A Lot 6 Click Score: 17   End of Session Equipment Utilized During Treatment: Gait belt;Rolling walker;Other (comment)(3 in 1)  Activity Tolerance: Patient tolerated treatment well Patient left: in chair;with call bell/phone within reach;with chair alarm set  OT Visit Diagnosis: Other abnormalities of gait and mobility (R26.89);Muscle weakness (generalized) (M62.81);Other symptoms and signs involving cognitive function                Time: 7371-0626 OT Time Calculation (min): 27 min Charges:  OT General Charges $OT Visit: 1 Visit OT Evaluation $OT Eval Moderate Complexity: 1 Mod OT Treatments $Self Care/Home Management : 8-22 mins    Britt Bottom 08/16/2018, 12:13 PM

## 2018-08-17 LAB — RENAL FUNCTION PANEL
Albumin: 2.1 g/dL — ABNORMAL LOW (ref 3.5–5.0)
Anion gap: 16 — ABNORMAL HIGH (ref 5–15)
BUN: 74 mg/dL — ABNORMAL HIGH (ref 8–23)
CO2: 21 mmol/L — ABNORMAL LOW (ref 22–32)
Calcium: 7.4 mg/dL — ABNORMAL LOW (ref 8.9–10.3)
Chloride: 100 mmol/L (ref 98–111)
Creatinine, Ser: 10.77 mg/dL — ABNORMAL HIGH (ref 0.44–1.00)
GFR calc Af Amer: 4 mL/min — ABNORMAL LOW (ref >60)
GFR calc non Af Amer: 3 mL/min — ABNORMAL LOW (ref >60)
Glucose, Bld: 107 mg/dL — ABNORMAL HIGH (ref 70–99)
Phosphorus: 5.2 mg/dL — ABNORMAL HIGH (ref 2.5–4.6)
Potassium: 4.4 mmol/L (ref 3.5–5.1)
Sodium: 137 mmol/L (ref 135–145)

## 2018-08-17 LAB — GLUCOSE, CAPILLARY
Glucose-Capillary: 98 mg/dL (ref 70–99)
Glucose-Capillary: 134 mg/dL — ABNORMAL HIGH (ref 70–99)
Glucose-Capillary: 135 mg/dL — ABNORMAL HIGH (ref 70–99)
Glucose-Capillary: 172 mg/dL — ABNORMAL HIGH (ref 70–99)

## 2018-08-17 NOTE — Progress Notes (Signed)
[facility] NEPHROLOGY PROGRESS NOTE  Assessment/ Plan: Pt is a 73 y.o. yo female with history of hypertension, diabetes, CKD stage III admitted with abnormal lab results.  #AKI on CKD stage III: Baseline creatinine around 1.5-2, presented with nausea vomiting diarrhea and the lab taken at PCP office were abnormal.  On admission patient had BUN 99, creatinine 12.67 and CO2 of 11.  It is likely ischemic ATN in the setting of concomitant use of lisinopril/hydrochlorothiazide and metformin.  The urinalysis with no RBC or WBC but has protein 100.  Doubt AIN.  -urine PCR 1.6g, k/lamda ratio 2.38. -Ultrasound kidneys with increased echogenicity but no obstruction. -Patient is nonoliguric however serum creatinine level is fairly slow to improve.  Creatinine level is 10.77 today.  She does not have uremic features.   Continue to watch for renal recovery.  -If plan to discharge patient to inpatient rehab we can follow the patient over there.  Palliative care consult noted, plan to follow-up in 2 days if no improvement.   #Metabolic acidosis corrected with bicarbonate.  Monitor CO2  #Hyperkalemia: Improved  #Anemia: K/lamda ratio acceptable for renal failure patient.  Iron saturation 29.   received aranesp on 3/2.  #Secondary hyperparathyroidism PTH 204.  Increased the dose of PhosLo.  Continue calcitriol.  Monitor electrolytes  Subjective: Seen and examined at bedside.  Not using oxygen.  Looks comfortable.  Denied nausea, vomiting, chest pain, shortness of breath. Objective Vital signs in last 24 hours: Vitals:   08/16/18 0832 08/16/18 1655 08/16/18 2000 08/17/18 0358  BP: (!) 136/55 (!) 127/57 (!) 134/57 135/66  Pulse: 73 67 74 77  Resp: 18 18 19  (!) 21  Temp: 98.5 F (36.9 C) 98.1 F (36.7 C) 98.4 F (36.9 C) 97.8 F (36.6 C)  TempSrc: Oral Oral Oral Oral  SpO2: 96% 98% 97% 95%  Weight:   60.4 kg   Height:       Weight change: -0.5 kg  Intake/Output Summary (Last 24  hours) at 08/17/2018 1018 Last data filed at 08/17/2018 0600 Gross per 24 hour  Intake 0 ml  Output 900 ml  Net -900 ml       Labs: Basic Metabolic Panel: Recent Labs  Lab 08/15/18 0532 08/16/18 0501 08/17/18 0519  NA 137 137 137  K 4.4 4.2 4.4  CL 96* 97* 100  CO2 21* 23 21*  GLUCOSE 98 112* 107*  BUN 78* 76* 74*  CREATININE 11.05* 10.83* 10.77*  CALCIUM 5.7* 6.1* 7.4*  PHOS 6.0* 5.6* 5.2*   Liver Function Tests: Recent Labs  Lab 08/15/18 0532 08/16/18 0501 08/17/18 0519  ALBUMIN 2.5* 2.0* 2.1*   No results for input(s): LIPASE, AMYLASE in the last 168 hours. No results for input(s): AMMONIA in the last 168 hours. CBC: Recent Labs  Lab 08/15/18 0532  WBC 9.8  HGB 8.9*  HCT 26.9*  MCV 90.0  PLT 131*   Cardiac Enzymes: No results for input(s): CKTOTAL, CKMB, CKMBINDEX, TROPONINI in the last 168 hours. CBG: Recent Labs  Lab 08/16/18 0829 08/16/18 1149 08/16/18 1650 08/16/18 1946 08/17/18 0730  GLUCAP 103* 134* 119* 139* 98    Iron Studies: No results for input(s): IRON, TIBC, TRANSFERRIN, FERRITIN in the last 72 hours. Studies/Results: Vas [place] Upper Extremity Venous Duplex  Result Date: 08/16/2018 UPPER VENOUS STUDY  Indications: Swelling Other Indications: Renal failure. Performing Technologist: Sharion Dove  Examination Guidelines: A complete evaluation includes B-mode imaging, spectral Doppler, color Doppler, and power Doppler as needed of all  accessible portions of each vessel. Bilateral testing is considered an integral part of a complete examination. Limited examinations for reoccurring indications may be performed as noted.  Right Findings: +----------+------------+----------+---------+-----------+-------+ RIGHT     CompressiblePropertiesPhasicitySpontaneousSummary +----------+------------+----------+---------+-----------+-------+ IJV           Full                 Yes       Yes             +----------+------------+----------+---------+-----------+-------+ Subclavian                         Yes       Yes            +----------+------------+----------+---------+-----------+-------+ Axillary      Full                 Yes       Yes            +----------+------------+----------+---------+-----------+-------+ Radial        Full                                          +----------+------------+----------+---------+-----------+-------+ Ulnar         Full                                          +----------+------------+----------+---------+-----------+-------+ Cephalic      Full                                          +----------+------------+----------+---------+-----------+-------+ Basilic       Full                                          +----------+------------+----------+---------+-----------+-------+  Left Findings: +----------+------------+----------+---------+-----------+-------+ LEFT      CompressiblePropertiesPhasicitySpontaneousSummary +----------+------------+----------+---------+-----------+-------+ IJV           Full                 Yes       Yes            +----------+------------+----------+---------+-----------+-------+ Subclavian                         Yes       Yes            +----------+------------+----------+---------+-----------+-------+ Axillary      Full                 Yes       Yes            +----------+------------+----------+---------+-----------+-------+ Brachial                           Yes       Yes            +----------+------------+----------+---------+-----------+-------+ Radial        Full                                          +----------+------------+----------+---------+-----------+-------+  Cephalic      Full                                          +----------+------------+----------+---------+-----------+-------+ Basilic       Full                                           +----------+------------+----------+---------+-----------+-------+  Summary:  Right: No evidence of deep vein thrombosis in the upper extremity. No evidence of superficial vein thrombosis in the upper extremity.  Left: No evidence of deep vein thrombosis in the upper extremity. No evidence of superficial vein thrombosis in the upper extremity.  *See table(s) above for measurements and observations.  Diagnosing physician: Servando Snare MD Electronically signed by Servando Snare MD on 08/16/2018 at 5:52:06 PM.    Final     Medications: Infusions: . sodium chloride Stopped (08/09/18 2346)    Scheduled Medications: . amLODipine  10 mg Oral Daily  . aspirin EC  81 mg Oral Daily  . calcitRIOL  0.25 mcg Oral Daily  . calcium acetate  1,334 mg Oral TID WC  . ferrous sulfate  325 mg Oral TID WC  . heparin  5,000 Units Subcutaneous Q8H  . insulin aspart  0-5 Units Subcutaneous QHS  . insulin aspart  0-9 Units Subcutaneous TID WC  . nicotine  21 mg Transdermal Daily  . pantoprazole  40 mg Oral Daily  . pravastatin  10 mg Oral q1800  . sodium chloride flush  3 mL Intravenous Q12H  . vitamin B-12  1,000 mcg Oral Daily    have reviewed scheduled and prn medications.  Physical Exam: General: Not in distress, sitting in bed Heart:RRR, s1s2 nl no rubs Lungs: Clear bilateral, no wheezing, no increased work of breathing Abdomen:soft, Non-tender, non-distended Extremities: No lower extremity edema. Neurology: Alert, awake, following commands, no asterixis.  Dron Prasad Bhandari 08/17/2018,10:18 AM  LOS: 8 days

## 2018-08-17 NOTE — Progress Notes (Signed)
Physical Therapy Treatment Patient Details Name: Sandra Sheppard MRN: [id number] DOB: 03-23-1946 Today's Date: 08/17/2018    History of Present Illness Pt is a 73 y/o F with significant PMH of hypertension, type 2 diabetes mellitus, and chronic kidney disease stage III now presenting for evaluation of abnormal outpatient blood work and found to have an acute kidney injury.    PT Comments    Patient agrees to walk some. Received in recliner. O2 not donned upon arrival, sats at 91% on room air. Patient performed transfer with min guard, cues for hand placement. Then ambulated 100 feet with RW and min guard. Reports LE weakness limiting activity tolerance. Patient O2 sats checked after walking and continue to be at 91%.  Patient has flat affect and decreased response to cues and direction. Patient will benefit from continued skilled PT to address her functional limitations and decreased safety with mobility.     Follow Up Recommendations  Home health PT;Supervision for mobility/OOB     Equipment Recommendations  Rolling walker with 5" wheels    Recommendations for Other Services       Precautions / Restrictions Precautions Precautions: Fall Restrictions Weight Bearing Restrictions: No    Mobility  Bed Mobility               General bed mobility comments: patient in recliner upon arrival to room  Transfers Overall transfer level: Needs assistance Equipment used: Rolling walker (2 wheeled) Transfers: Sit to/from Stand Sit to Stand: Min guard         General transfer comment: increased time required, multiodal cues for correct hand placement and technique  Ambulation/Gait Ambulation/Gait assistance: Min guard Gait Distance (Feet): 100 Feet Assistive device: Rolling walker (2 wheeled) Gait Pattern/deviations: Step-through pattern;Decreased stride length Gait velocity: decreased       Stairs             Wheelchair Mobility    Modified Rankin (Stroke  Patients Only)       Balance Overall balance assessment: Needs assistance Sitting-balance support: Feet supported Sitting balance-Leahy Scale: Good     Standing balance support: Bilateral upper extremity supported Standing balance-Leahy Scale: Good                              Cognition Arousal/Alertness: Awake/alert Behavior During Therapy: Flat affect Overall Cognitive Status: No family/caregiver present to determine baseline cognitive functioning Area of Impairment: Memory;Problem solving                       Following Commands: Follows one step commands with increased time Safety/Judgement: Decreased awareness of safety;Decreased awareness of deficits   Problem Solving: Slow processing;Requires verbal cues        Exercises      General Comments        Pertinent Vitals/Pain Pain Assessment: No/denies pain    Home Living                      Prior Function            PT Goals (current goals can now be found in the care plan section) Acute Rehab PT Goals Patient Stated Goal: go home PT Goal Formulation: With patient Time For Goal Achievement: 08/24/18 Potential to Achieve Goals: Good Progress towards PT goals: Progressing toward goals    Frequency    Min 3X/week      PT Plan Current plan remains  appropriate    Co-evaluation              AM-PAC PT "6 Clicks" Mobility   Outcome Measure  Help needed turning from your back to your side while in a flat bed without using bedrails?: A Little Help needed moving from lying on your back to sitting on the side of a flat bed without using bedrails?: A Little Help needed moving to and from a bed to a chair (including a wheelchair)?: A Little Help needed standing up from a chair using your arms (e.g., wheelchair or bedside chair)?: A Little Help needed to walk in hospital room?: A Little Help needed climbing 3-5 steps with a railing? : A Little 6 Click Score: 18     End of Session Equipment Utilized During Treatment: Gait belt Activity Tolerance: Patient tolerated treatment well;Patient limited by fatigue Patient left: in chair;with chair alarm set;with call bell/phone within reach Nurse Communication: Mobility status PT Visit Diagnosis: Unsteadiness on feet (R26.81);Muscle weakness (generalized) (M62.81);Difficulty in walking, not elsewhere classified (R26.2)     Time: 1030-1051 PT Time Calculation (min) (ACUTE ONLY): 21 min  Charges:  $Gait Training: 8-22 mins                     Genesi Stefanko, PT, GCS 08/17/18,11:12 AM

## 2018-08-17 NOTE — Progress Notes (Signed)
Inpatient Rehab Admissions:  Consult received for inpatient rehab admission.  Noted that at this time pt currently ambulating 100' with min guard assist and therapy recommendations are for home with home health therapy as follow up.  Please see note from Algis Liming, PA dated 08/17/2018.  Will sign off at this time.  Please contact me at [phone] with questions.    Michel Santee, PT, DPT Admissions Coordinator 08/17/18 9:15 AM

## 2018-08-17 NOTE — Progress Notes (Signed)
Occupational Therapy Treatment Patient Details Name: Sandra Sheppard MRN: [id number] DOB: 1946-04-28 Today's Date: 08/17/2018    History of present illness Pt is a 73 y/o F with significant PMH of hypertension, type 2 diabetes mellitus, and chronic kidney disease stage III now presenting for evaluation of abnormal outpatient blood work and found to have an acute kidney injury.   OT comments  Pt making good progress with functional goals. Pt with flat affect and impaired problem solving requiring multimodal cues during ADL mobility with RW and 3 in 1. OT will continue to follow acutely  Follow Up Recommendations  Home health OT;Supervision/Assistance - 24 hour    Equipment Recommendations  Tub/shower bench    Recommendations for Other Services      Precautions / Restrictions Precautions Precautions: Fall Restrictions Weight Bearing Restrictions: No       Mobility Bed Mobility Overal bed mobility: Needs Assistance Bed Mobility: Supine to Sit     Supine to sit: Supervision     General bed mobility comments: patient in recliner upon arrival to room  Transfers Overall transfer level: Needs assistance Equipment used: Rolling walker (2 wheeled) Transfers: Sit to/from Stand Sit to Stand: Min guard         General transfer comment: increased time required, multiodal cues for correct hand placement and technique    Balance Overall balance assessment: Needs assistance Sitting-balance support: Feet supported Sitting balance-Leahy Scale: Good     Standing balance support: Bilateral upper extremity supported;During functional activity Standing balance-Leahy Scale: Good                             ADL either performed or assessed with clinical judgement   ADL Overall ADL's : Needs assistance/impaired                                             Vision Patient Visual Report: No change from baseline     Perception     Praxis       Cognition Arousal/Alertness: Awake/alert Behavior During Therapy: Flat affect Overall Cognitive Status: No family/caregiver present to determine baseline cognitive functioning Area of Impairment: Memory;Problem solving                       Following Commands: Follows one step commands with increased time Safety/Judgement: Decreased awareness of safety;Decreased awareness of deficits   Problem Solving: Slow processing;Requires verbal cues          Exercises     Shoulder Instructions       General Comments      Pertinent Vitals/ Pain       Pain Assessment: No/denies pain Faces Pain Scale: No hurt Pain Intervention(s): Monitored during session  Home Living                                          Prior Functioning/Environment              Frequency  Min 2X/week        Progress Toward Goals  OT Goals(current goals can now be found in the care plan section)  Progress towards OT goals: Progressing toward goals  Acute Rehab OT Goals Patient Stated Goal: go home  Plan      Co-evaluation                 AM-PAC OT "6 Clicks" Daily Activity     Outcome Measure   Help from another person eating meals?: None Help from another person taking care of personal grooming?: A Little Help from another person toileting, which includes using toliet, bedpan, or urinal?: A Little Help from another person bathing (including washing, rinsing, drying)?: A Little Help from another person to put on and taking off regular upper body clothing?: None Help from another person to put on and taking off regular lower body clothing?: A Lot 6 Click Score: 19    End of Session Equipment Utilized During Treatment: Gait belt;Rolling walker;Other (comment)(3 in 1)  OT Visit Diagnosis: Other abnormalities of gait and mobility (R26.89);Muscle weakness (generalized) (M62.81);Other symptoms and signs involving cognitive function   Activity Tolerance  Patient tolerated treatment well   Patient Left in chair;with call bell/phone within reach;with chair alarm set   Nurse Communication          Time: 6438-3818 OT Time Calculation (min): 27 min  Charges: OT General Charges $OT Visit: 1 Visit OT Treatments $Self Care/Home Management : 8-22 mins $Therapeutic Activity: 8-22 mins     Britt Bottom 08/17/2018, 12:11 PM

## 2018-08-17 NOTE — Progress Notes (Signed)
PROGRESS NOTE  Sandra Sheppard TML:[id number] DOB: 05-11-1946 DOA: 08/08/2018 PCP: Prince Solian, MD  HPI/Recap of past 24 hours:  Urine out 900 cc last 24hrs documented She is less edematous, she is put on oxygen since 3/3 night, she denies sob, she is off ivf since 3/4  She denies pain,    she c/o feeling weak,  she reports prior to this hospitalization, she lives alone and works parttime at [employer] until February this year She is windowed, she has no children, she has sisters and brothers lives close by  Assessment/Plan: Principal Problem:   AKI (acute kidney injury) ([place]) Active Problems:   HYPERTENSION, MILD   CKD (chronic kidney disease) stage 3, GFR 30-59 ml/min (HCC)   Hyperkalemia   Metabolic acidosis, increased anion gap   Normocytic anemia   Diabetes mellitus type II, non insulin dependent (HCC)   Hypocalcemia   FTT (failure to thrive) in adult   Acute renal failure with acute tubular necrosis superimposed on stage 3 chronic kidney disease ([place])   Palliative care encounter  AKI (acute kidney injury) ([place]) on   CKD (chronic kidney disease) stage 3, GFR 30-59 ml/min ([place]): Baseline creatinine 1.5-2.Concern about ATN,  in the setting of ACE inhibitor and diuretic. On admission of 12.6, renal ultrasound did not show hydronephrosis. spep negative for m spike Cr slow to improved still on IV fluids but has remain oliguric. Now edematous and has o2 requirement, ivf stopped on 3/4 Case discussed with nephrology and palliative care today  Generalized edema, more in bilateral arms venous US negative for DVT   Hyperkalemia: The setting of acute kidney injury and ACE inhibitor now improved. Resolved.  Metabolic acidosis: In the setting of acute kidney injury and metformin use. Improved with bicarbonate supplementation with IV fluids.  Normocytic anemia: Ferritin of 54 hemoglobin of 7.6 MCV of 87. B12 117,  S/p im B12 supplementx7 days, now on oral b12  supplement  Essential hypertension: Hydrochlorothiazide lisinopril have been held. Continue amlodipine 10 mg, blood pressure is fairly controlled.  Diabetes mellitus type II: With an A1c of 6.2. Continue sliding scale insulin.  Hyperlipidemia: Continue pravastatin.  Acute confusional state: Seroquel at bedtime prn , Haldol IV as needed. Last dose of 2/29 Appear has improved, has been calm and cooperative without needing any prn meds  GI prophylaxis: Due to significant elevated BUN she was started on Protonix empirically.  DVT prophylaxis: heparin  Code Status: partial  Family Communication: patient   Disposition Plan: not ready to discharge  Tele has been unremarkable, will d/c tele Consultants:  Nephrology  Palliative care consult per nephrology recommendation   Procedures:  none  Antibiotics:  none   Objective: BP 135/66 (BP Location: Right Arm)   Pulse 77   Temp 97.8 F (36.6 C) (Oral)   Resp (!) 21   Ht 5\' 4"  (1.626 m)   Wt 60.4 kg   SpO2 95%   BMI 22.86 kg/m   Intake/Output Summary (Last 24 hours) at 08/17/2018 1038 Last data filed at 08/17/2018 0600 Gross per 24 hour  Intake 0 ml  Output 900 ml  Net -900 ml   Filed Weights   08/15/18 2130 08/16/18 0500 08/16/18 2000  Weight: 60.9 kg 60.9 kg 60.4 kg    Exam: Patient is examined daily including today on 08/17/2018, exams remain the same as of yesterday except that has changed    General:  Very frail, less edematous   Cardiovascular: RRR  Respiratory: improved aeration, no wheezing,  no rales, no rhonchi  Abdomen: Soft/ND/NT, positive BS  Musculoskeletal: bilateral upper extremity pitting Edema slightly improved  Neuro: alert, oriented   Data Reviewed: Basic Metabolic Panel: Recent Labs  Lab 08/13/18 0432 08/14/18 0436 08/15/18 0532 08/16/18 0501 08/17/18 0519  NA 137 135 137 137 137  K 4.6 4.2 4.4 4.2 4.4  CL 89* 91* 96* 97* 100  CO2 30 27 21* 23 21*  GLUCOSE 117*  118* 98 112* 107*  BUN 86* 83* 78* 76* 74*  CREATININE 11.87* 11.20* 11.05* 10.83* 10.77*  CALCIUM 5.3* 5.2* 5.7* 6.1* 7.4*  PHOS 6.1* 6.1* 6.0* 5.6* 5.2*   Liver Function Tests: Recent Labs  Lab 08/13/18 0432 08/14/18 0436 08/15/18 0532 08/16/18 0501 08/17/18 0519  ALBUMIN 2.4* 2.2* 2.5* 2.0* 2.1*   No results for input(s): LIPASE, AMYLASE in the last 168 hours. No results for input(s): AMMONIA in the last 168 hours. CBC: Recent Labs  Lab 08/15/18 0532  WBC 9.8  HGB 8.9*  HCT 26.9*  MCV 90.0  PLT 131*   Cardiac Enzymes:   No results for input(s): CKTOTAL, CKMB, CKMBINDEX, TROPONINI in the last 168 hours. BNP (last 3 results) No results for input(s): BNP in the last 8760 hours.  ProBNP (last 3 results) No results for input(s): PROBNP in the last 8760 hours.  CBG: Recent Labs  Lab 08/16/18 0829 08/16/18 1149 08/16/18 1650 08/16/18 1946 08/17/18 0730  GLUCAP 103* 134* 119* 139* 98    No results found for this or any previous visit (from the past 240 hour(s)).   Studies: Vas [place] Upper Extremity Venous Duplex  Result Date: 08/16/2018 UPPER VENOUS STUDY  Indications: Swelling Other Indications: Renal failure. Performing Technologist: Sharion Dove  Examination Guidelines: A complete evaluation includes B-mode imaging, spectral Doppler, color Doppler, and power Doppler as needed of all accessible portions of each vessel. Bilateral testing is considered an integral part of a complete examination. Limited examinations for reoccurring indications may be performed as noted.  Right Findings: +----------+------------+----------+---------+-----------+-------+ RIGHT     CompressiblePropertiesPhasicitySpontaneousSummary +----------+------------+----------+---------+-----------+-------+ IJV           Full                 Yes       Yes            +----------+------------+----------+---------+-----------+-------+ Subclavian                         Yes       Yes             +----------+------------+----------+---------+-----------+-------+ Axillary      Full                 Yes       Yes            +----------+------------+----------+---------+-----------+-------+ Radial        Full                                          +----------+------------+----------+---------+-----------+-------+ Ulnar         Full                                          +----------+------------+----------+---------+-----------+-------+ Cephalic      Full                                          +----------+------------+----------+---------+-----------+-------+  Basilic       Full                                          +----------+------------+----------+---------+-----------+-------+  Left Findings: +----------+------------+----------+---------+-----------+-------+ LEFT      CompressiblePropertiesPhasicitySpontaneousSummary +----------+------------+----------+---------+-----------+-------+ IJV           Full                 Yes       Yes            +----------+------------+----------+---------+-----------+-------+ Subclavian                         Yes       Yes            +----------+------------+----------+---------+-----------+-------+ Axillary      Full                 Yes       Yes            +----------+------------+----------+---------+-----------+-------+ Brachial                           Yes       Yes            +----------+------------+----------+---------+-----------+-------+ Radial        Full                                          +----------+------------+----------+---------+-----------+-------+ Cephalic      Full                                          +----------+------------+----------+---------+-----------+-------+ Basilic       Full                                          +----------+------------+----------+---------+-----------+-------+  Summary:  Right: No evidence of deep vein  thrombosis in the upper extremity. No evidence of superficial vein thrombosis in the upper extremity.  Left: No evidence of deep vein thrombosis in the upper extremity. No evidence of superficial vein thrombosis in the upper extremity.  *See table(s) above for measurements and observations.  Diagnosing physician: Servando Snare MD Electronically signed by Servando Snare MD on 08/16/2018 at 5:52:06 PM.    Final     Scheduled Meds: . amLODipine  10 mg Oral Daily  . aspirin EC  81 mg Oral Daily  . calcitRIOL  0.25 mcg Oral Daily  . calcium acetate  1,334 mg Oral TID WC  . ferrous sulfate  325 mg Oral TID WC  . heparin  5,000 Units Subcutaneous Q8H  . insulin aspart  0-5 Units Subcutaneous QHS  . insulin aspart  0-9 Units Subcutaneous TID WC  . nicotine  21 mg Transdermal Daily  . pantoprazole  40 mg Oral Daily  . pravastatin  10 mg Oral q1800  . sodium chloride flush  3 mL Intravenous Q12H  . vitamin B-12  1,000 mcg Oral Daily    Continuous Infusions: . sodium chloride Stopped (  08/09/18 2346)     Time spent: 53mins,  I have personally reviewed and interpreted on  08/17/2018 daily labs, tele strips, imagings as discussed above under date review session and assessment and plans.  I reviewed all nursing notes, pharmacy notes, consultant notes,  vitals, pertinent old records  I have discussed plan of care as described above with RN , patient  on 08/17/2018   Florencia Reasons MD, PhD  [facility] Pager [phone]. If 7PM-7AM, please contact night-coverage at [email or web address], password [facility] 08/17/2018, 10:38 AM  LOS: 8 days

## 2018-08-17 NOTE — Care Management Important Message (Signed)
Important Message  Patient Details  Name: Sandra Sheppard MRN: [id number] Date of Birth: 04-19-1946   Medicare Important Message Given:  Yes    [name] 08/17/2018, 1:46 PM

## 2018-08-17 NOTE — Progress Notes (Signed)
Thank you for consult on Ms. Dacruz. Patient with debility is currently at min guard assist. Agree with therapy recommendations of home therapy/supervision after discharge.

## 2018-08-18 LAB — GLUCOSE, CAPILLARY
Glucose-Capillary: 101 mg/dL — ABNORMAL HIGH (ref 70–99)
Glucose-Capillary: 129 mg/dL — ABNORMAL HIGH (ref 70–99)
Glucose-Capillary: 127 mg/dL — ABNORMAL HIGH (ref 70–99)
Glucose-Capillary: 170 mg/dL — ABNORMAL HIGH (ref 70–99)

## 2018-08-18 LAB — RENAL FUNCTION PANEL
Albumin: 2.3 g/dL — ABNORMAL LOW (ref 3.5–5.0)
Anion gap: 14 (ref 5–15)
BUN: 73 mg/dL — ABNORMAL HIGH (ref 8–23)
CO2: 20 mmol/L — ABNORMAL LOW (ref 22–32)
Calcium: 8 mg/dL — ABNORMAL LOW (ref 8.9–10.3)
Chloride: 103 mmol/L (ref 98–111)
Creatinine, Ser: 10.27 mg/dL — ABNORMAL HIGH (ref 0.44–1.00)
GFR calc Af Amer: 4 mL/min — ABNORMAL LOW (ref >60)
GFR calc non Af Amer: 3 mL/min — ABNORMAL LOW (ref >60)
Glucose, Bld: 113 mg/dL — ABNORMAL HIGH (ref 70–99)
Phosphorus: 4.5 mg/dL (ref 2.5–4.6)
Potassium: 4.4 mmol/L (ref 3.5–5.1)
Sodium: 137 mmol/L (ref 135–145)

## 2018-08-18 MED ORDER — SODIUM BICARBONATE 650 MG PO TABS
650.0000 mg | ORAL_TABLET | Freq: Two times a day (BID) | ORAL | Status: DC
  Administered 2018-08-18 – 2018-08-24 (×13): 650 mg via ORAL
  Filled 2018-08-18 (×13): qty 1

## 2018-08-18 NOTE — Progress Notes (Signed)
PROGRESS NOTE  Sandra Sheppard JSE:[id number] DOB: [birth year] DOA: 08/08/2018 PCP: Prince Solian, MD  HPI/Recap of past 24 hours:  Urine out 700 cc last 24hrs documented She is less edematous, she is now on room air, denies sob,she is off ivf since 3/4  She denies pain,    she c/o feeling weak,  she reports prior to this hospitalization, she lives alone and works parttime at [employer] until February this year She is windowed, she has no children, she has sisters and brothers lives close by  Assessment/Plan: Principal Problem:   AKI (acute kidney injury) ([place]) Active Problems:   HYPERTENSION, MILD   CKD (chronic kidney disease) stage 3, GFR 30-59 ml/min (HCC)   Hyperkalemia   Metabolic acidosis, increased anion gap   Normocytic anemia   Diabetes mellitus type II, non insulin dependent (HCC)   Hypocalcemia   FTT (failure to thrive) in adult   Acute renal failure with acute tubular necrosis superimposed on stage 3 chronic kidney disease ([place])   Palliative care encounter  AKI (acute kidney injury) ([place]) on   CKD (chronic kidney disease) stage 3, GFR 30-59 ml/min ([place]): Baseline creatinine 1.5-2.Concern about ATN,  in the setting of ACE inhibitor and diuretic. On admission of 12.6, renal ultrasound did not show hydronephrosis. spep negative for m spike Cr slow to improved still on IV fluids but has remain oliguric. Now edematous and has o2 requirement, ivf stopped on 3/4 Possible slowly improving, but cr remain above 10, gfr in single digit  Case discussed with nephrology today, will need to discuss with palliative care on Monday again  Generalized edema, more in bilateral arms venous US negative for DVT Improving after ivf stopped   Hyperkalemia: The setting of acute kidney injury and ACE inhibitor now improved. Resolved.  Metabolic acidosis: In the setting of acute kidney injury and metformin use. Improved with bicarbonate supplementation with IV fluids.  Normocytic  anemia: Ferritin of 54 hemoglobin of 7.6 MCV of 87. B12 117,  S/p im B12 supplementx7 days, now on oral b12 supplement  Essential hypertension: Hydrochlorothiazide lisinopril have been held. Continue amlodipine 10 mg, blood pressure is fairly controlled.  Diabetes mellitus type II: With an A1c of 6.2. Continue sliding scale insulin.  Hyperlipidemia: Continue pravastatin.  Acute confusional state: Seroquel at bedtime prn , Haldol IV as needed. Last dose of 2/29 Appear has improved, has been calm and cooperative without needing any prn meds  GI prophylaxis: Due to significant elevated BUN she was started on Protonix empirically.  DVT prophylaxis: heparin  Code Status: partial  Family Communication: patient   Disposition Plan: recommend CIR since patient was independent, lives by herself, still works Warehouse manager as a Scientist, water quality at [employer] a few weeks ago prior to this hospitalization, now she is fairly weak,    Consultants:  Nephrology  Palliative care consult per nephrology recommendation  CIR   Procedures:  none  Antibiotics:  none   Objective: BP (!) 142/58 (BP Location: Right Arm)   Pulse 80   Temp 98.5 F (36.9 C) (Oral)   Resp 16   Ht 5\' 4"  (1.626 m)   Wt 60.2 kg   SpO2 95%   BMI 22.78 kg/m   Intake/Output Summary (Last 24 hours) at 08/18/2018 0832 Last data filed at 08/18/2018 0734 Gross per 24 hour  Intake 540 ml  Output 800 ml  Net -260 ml   Filed Weights   08/16/18 0500 08/16/18 2000 08/17/18 2039  Weight: 60.9 kg 60.4 kg 60.2  kg    Exam: Patient is examined daily including today on 08/18/2018, exams remain the same as of yesterday except that has changed    General:   frail, less edematous , appear stronger   Cardiovascular: RRR  Respiratory: improved aeration, no wheezing, no rales, no rhonchi  Abdomen: Soft/ND/NT, positive BS  Musculoskeletal: bilateral upper extremity pitting Edema slightly improved  Neuro: alert, oriented    Data Reviewed: Basic Metabolic Panel: Recent Labs  Lab 08/14/18 0436 08/15/18 0532 08/16/18 0501 08/17/18 0519 08/18/18 0527  NA 135 137 137 137 137  K 4.2 4.4 4.2 4.4 4.4  CL 91* 96* 97* 100 103  CO2 27 21* 23 21* 20*  GLUCOSE 118* 98 112* 107* 113*  BUN 83* 78* 76* 74* 73*  CREATININE 11.20* 11.05* 10.83* 10.77* 10.27*  CALCIUM 5.2* 5.7* 6.1* 7.4* 8.0*  PHOS 6.1* 6.0* 5.6* 5.2* 4.5   Liver Function Tests: Recent Labs  Lab 08/14/18 0436 08/15/18 0532 08/16/18 0501 08/17/18 0519 08/18/18 0527  ALBUMIN 2.2* 2.5* 2.0* 2.1* 2.3*   No results for input(s): LIPASE, AMYLASE in the last 168 hours. No results for input(s): AMMONIA in the last 168 hours. CBC: Recent Labs  Lab 08/15/18 0532  WBC 9.8  HGB 8.9*  HCT 26.9*  MCV 90.0  PLT 131*   Cardiac Enzymes:   No results for input(s): CKTOTAL, CKMB, CKMBINDEX, TROPONINI in the last 168 hours. BNP (last 3 results) No results for input(s): BNP in the last 8760 hours.  ProBNP (last 3 results) No results for input(s): PROBNP in the last 8760 hours.  CBG: Recent Labs  Lab 08/17/18 0730 08/17/18 1140 08/17/18 1635 08/17/18 2040 08/18/18 0757  GLUCAP 98 134* 135* 172* 101*    No results found for this or any previous visit (from the past 240 hour(s)).   Studies: No results found.  Scheduled Meds: . amLODipine  10 mg Oral Daily  . aspirin EC  81 mg Oral Daily  . calcitRIOL  0.25 mcg Oral Daily  . calcium acetate  1,334 mg Oral TID WC  . ferrous sulfate  325 mg Oral TID WC  . heparin  5,000 Units Subcutaneous Q8H  . insulin aspart  0-5 Units Subcutaneous QHS  . insulin aspart  0-9 Units Subcutaneous TID WC  . nicotine  21 mg Transdermal Daily  . pantoprazole  40 mg Oral Daily  . pravastatin  10 mg Oral q1800  . sodium chloride flush  3 mL Intravenous Q12H  . vitamin B-12  1,000 mcg Oral Daily    Continuous Infusions: . sodium chloride Stopped (08/09/18 2346)     Time spent: 47mins,  I have  personally reviewed and interpreted on  08/18/2018 daily labs,  imagings as discussed above under date review session and assessment and plans.  I reviewed all nursing notes, pharmacy notes, consultant notes,  vitals, pertinent old records  I have discussed plan of care as described above with RN , patient  on 08/18/2018   Florencia Reasons MD, PhD  [facility] Pager [phone]. If 7PM-7AM, please contact night-coverage at [email or web address], password [facility] 08/18/2018, 8:32 AM  LOS: 9 days

## 2018-08-18 NOTE — Progress Notes (Signed)
[facility] NEPHROLOGY PROGRESS NOTE  Assessment/ Plan: Pt is a 73 y.o. yo female with history of hypertension, diabetes, CKD stage III admitted with abnormal lab results.  #AKI on CKD stage III: Baseline creatinine around 1.5-2, presented with nausea vomiting diarrhea and the lab taken at PCP office were abnormal.  On admission patient had BUN 99, creatinine 12.67 and CO2 of 11.  It is likely ischemic ATN in the setting of concomitant use of lisinopril/hydrochlorothiazide and metformin.  The urinalysis with no RBC or WBC but has protein 100.  Doubt AIN.  -urine PCR 1.6g, k/lamda ratio 2.38. -Ultrasound kidneys with increased echogenicity but no obstruction. -Patient is nonoliguric however serum creatinine level is fairly slow to improve.  Creatinine level is 10.2 today.  She does not have uremic features.   Continue to watch for renal recovery.  -If plan to discharge patient to inpatient rehab we can follow the patient over there.  Palliative care consult noted, plan to follow-up in 2 days if no improvement.   #Metabolic acidosis corrected with bicarbonate.  Monitor CO2  #Hyperkalemia: Improved  #Anemia: K/lamda ratio acceptable for renal failure patient.  Iron saturation 29.   received aranesp on 3/2.  #Secondary hyperparathyroidism PTH 204.  Increased the dose of PhosLo.  Continue calcitriol.  Monitor electrolytes  #Physical deconditioning: Continue PT OT.  Patient needs to be more mobile.  She lives alone.  Subjective: Seen and examined at bedside.  No new event.  Not on oxygen.  Nonoliguric.  No chest pain or shortness of breath. Objective Vital signs in last 24 hours: Vitals:   08/17/18 1700 08/17/18 2039 08/18/18 0458 08/18/18 0834  BP: (!) 135/54 (!) 117/50 (!) 142/58 136/60  Pulse: 78 74 80 77  Resp: 18 16 16 20   Temp: (!) 97.5 F (36.4 C) 98.4 F (36.9 C) 98.5 F (36.9 C) 98.2 F (36.8 C)  TempSrc: Oral Oral Oral Oral  SpO2:  91% 95% 95%  Weight:  60.2 kg     Height:       Weight change: -0.2 kg  Intake/Output Summary (Last 24 hours) at 08/18/2018 1057 Last data filed at 08/18/2018 6195 Gross per 24 hour  Intake 660 ml  Output 750 ml  Net -90 ml       Labs: Basic Metabolic Panel: Recent Labs  Lab 08/16/18 0501 08/17/18 0519 08/18/18 0527  NA 137 137 137  K 4.2 4.4 4.4  CL 97* 100 103  CO2 23 21* 20*  GLUCOSE 112* 107* 113*  BUN 76* 74* 73*  CREATININE 10.83* 10.77* 10.27*  CALCIUM 6.1* 7.4* 8.0*  PHOS 5.6* 5.2* 4.5   Liver Function Tests: Recent Labs  Lab 08/16/18 0501 08/17/18 0519 08/18/18 0527  ALBUMIN 2.0* 2.1* 2.3*   No results for input(s): LIPASE, AMYLASE in the last 168 hours. No results for input(s): AMMONIA in the last 168 hours. CBC: Recent Labs  Lab 08/15/18 0532  WBC 9.8  HGB 8.9*  HCT 26.9*  MCV 90.0  PLT 131*   Cardiac Enzymes: No results for input(s): CKTOTAL, CKMB, CKMBINDEX, TROPONINI in the last 168 hours. CBG: Recent Labs  Lab 08/17/18 0730 08/17/18 1140 08/17/18 1635 08/17/18 2040 08/18/18 0757  GLUCAP 98 134* 135* 172* 101*    Iron Studies: No results for input(s): IRON, TIBC, TRANSFERRIN, FERRITIN in the last 72 hours. Studies/Results: Vas [place] Upper Extremity Venous Duplex  Result Date: 08/16/2018 UPPER VENOUS STUDY  Indications: Swelling Other Indications: Renal failure. Performing Technologist: Sharion Dove  Examination Guidelines: A complete evaluation includes B-mode imaging, spectral Doppler, color Doppler, and power Doppler as needed of all accessible portions of each vessel. Bilateral testing is considered an integral part of a complete examination. Limited examinations for reoccurring indications may be performed as noted.  Right Findings: +----------+------------+----------+---------+-----------+-------+ RIGHT     CompressiblePropertiesPhasicitySpontaneousSummary +----------+------------+----------+---------+-----------+-------+ IJV           Full                  Yes       Yes            +----------+------------+----------+---------+-----------+-------+ Subclavian                         Yes       Yes            +----------+------------+----------+---------+-----------+-------+ Axillary      Full                 Yes       Yes            +----------+------------+----------+---------+-----------+-------+ Radial        Full                                          +----------+------------+----------+---------+-----------+-------+ Ulnar         Full                                          +----------+------------+----------+---------+-----------+-------+ Cephalic      Full                                          +----------+------------+----------+---------+-----------+-------+ Basilic       Full                                          +----------+------------+----------+---------+-----------+-------+  Left Findings: +----------+------------+----------+---------+-----------+-------+ LEFT      CompressiblePropertiesPhasicitySpontaneousSummary +----------+------------+----------+---------+-----------+-------+ IJV           Full                 Yes       Yes            +----------+------------+----------+---------+-----------+-------+ Subclavian                         Yes       Yes            +----------+------------+----------+---------+-----------+-------+ Axillary      Full                 Yes       Yes            +----------+------------+----------+---------+-----------+-------+ Brachial                           Yes       Yes            +----------+------------+----------+---------+-----------+-------+ Radial        Full                                          +----------+------------+----------+---------+-----------+-------+  Cephalic      Full                                          +----------+------------+----------+---------+-----------+-------+ Basilic       Full                                           +----------+------------+----------+---------+-----------+-------+  Summary:  Right: No evidence of deep vein thrombosis in the upper extremity. No evidence of superficial vein thrombosis in the upper extremity.  Left: No evidence of deep vein thrombosis in the upper extremity. No evidence of superficial vein thrombosis in the upper extremity.  *See table(s) above for measurements and observations.  Diagnosing physician: Servando Snare MD Electronically signed by Servando Snare MD on 08/16/2018 at 5:52:06 PM.    Final     Medications: Infusions: . sodium chloride Stopped (08/09/18 2346)    Scheduled Medications: . amLODipine  10 mg Oral Daily  . aspirin EC  81 mg Oral Daily  . calcitRIOL  0.25 mcg Oral Daily  . calcium acetate  1,334 mg Oral TID WC  . ferrous sulfate  325 mg Oral TID WC  . heparin  5,000 Units Subcutaneous Q8H  . insulin aspart  0-5 Units Subcutaneous QHS  . insulin aspart  0-9 Units Subcutaneous TID WC  . nicotine  21 mg Transdermal Daily  . pantoprazole  40 mg Oral Daily  . pravastatin  10 mg Oral q1800  . sodium chloride flush  3 mL Intravenous Q12H  . vitamin B-12  1,000 mcg Oral Daily    have reviewed scheduled and prn medications.  Physical Exam: General: Not in distress, sitting in bed Heart:RRR, s1s2 nl no rubs Lungs: Clear bilateral, no wheezing or crackle Abdomen:soft, Non-tender, non-distended Extremities: Trace lower extremity edema. Neurology: Alert, awake, following commands, no asterixis.  Dashae Wilcher Prasad Nayely Dingus 08/18/2018,10:57 AM  LOS: 9 days

## 2018-08-19 LAB — GLUCOSE, CAPILLARY
Glucose-Capillary: 108 mg/dL — ABNORMAL HIGH (ref 70–99)
Glucose-Capillary: 110 mg/dL — ABNORMAL HIGH (ref 70–99)
Glucose-Capillary: 121 mg/dL — ABNORMAL HIGH (ref 70–99)
Glucose-Capillary: 153 mg/dL — ABNORMAL HIGH (ref 70–99)

## 2018-08-19 LAB — RENAL FUNCTION PANEL
Albumin: 2.2 g/dL — ABNORMAL LOW (ref 3.5–5.0)
Anion gap: 13 (ref 5–15)
BUN: 71 mg/dL — ABNORMAL HIGH (ref 8–23)
CO2: 22 mmol/L (ref 22–32)
Calcium: 8.5 mg/dL — ABNORMAL LOW (ref 8.9–10.3)
Chloride: 102 mmol/L (ref 98–111)
Creatinine, Ser: 9.65 mg/dL — ABNORMAL HIGH (ref 0.44–1.00)
GFR calc Af Amer: 4 mL/min — ABNORMAL LOW
GFR calc non Af Amer: 4 mL/min — ABNORMAL LOW
Glucose, Bld: 116 mg/dL — ABNORMAL HIGH (ref 70–99)
Phosphorus: 3.8 mg/dL (ref 2.5–4.6)
Potassium: 4.4 mmol/L (ref 3.5–5.1)
Sodium: 137 mmol/L (ref 135–145)

## 2018-08-19 NOTE — Progress Notes (Signed)
[facility] NEPHROLOGY PROGRESS NOTE  Assessment/ Plan: Pt is a 73 y.o. yo female with history of hypertension, diabetes, CKD stage III admitted with abnormal lab results.  #AKI on CKD stage III: Baseline creatinine around 1.5-2, presented with nausea vomiting diarrhea and the lab taken at PCP office were abnormal.  On admission patient had BUN 99, creatinine 12.67 and CO2 of 11.  It is likely ischemic ATN in the setting of concomitant use of lisinopril/hydrochlorothiazide and metformin.  The urinalysis with no RBC or WBC but has protein 100.  Doubt AIN.  -urine PCR 1.6g, k/lamda ratio 2.38. -Ultrasound kidneys with increased echogenicity but no obstruction. -Patient is nonoliguric however serum creatinine level is fairly slow to improve.  Creatinine level down to 9.65 today.  She does not have uremic features.  No need for dialysis at this time. -If plan to discharge patient to inpatient rehab we can follow the patient over there.  Palliative care consult noted, plan to follow-up in 2 days if no improvement.   #Metabolic acidosis corrected with bicarbonate.  Monitor CO2  #Hyperkalemia: Improved  #Anemia: K/lamda ratio acceptable for renal failure patient.  Iron saturation 29.   received aranesp on 3/2.  #Secondary hyperparathyroidism PTH 204.  Continue PhosLo.  Continue calcitriol.  Monitor electrolytes  #Physical deconditioning: Continue PT OT.  Patient needs to be more mobile.  She lives alone.  Subjective: Seen and examined at bedside.  No new event.  Denied headache, dizziness, nausea, vomiting, chest pain, shortness breath. Objective Vital signs in last 24 hours: Vitals:   08/18/18 1637 08/18/18 2040 08/19/18 0425 08/19/18 0841  BP: (!) 137/56 (!) 153/66 140/71 (!) 131/55  Pulse: 71 81 81 73  Resp: 18 15 18 20   Temp: 97.7 F (36.5 C) 98.1 F (36.7 C) 97.7 F (36.5 C) 98.2 F (36.8 C)  TempSrc: Oral Oral Oral Oral  SpO2: 97% 96% 93% 97%  Weight:   60.1 kg    Height:       Weight change: -0.1 kg  Intake/Output Summary (Last 24 hours) at 08/19/2018 1207 Last data filed at 08/19/2018 7253 Gross per 24 hour  Intake 300 ml  Output 325 ml  Net -25 ml       Labs: Basic Metabolic Panel: Recent Labs  Lab 08/17/18 0519 08/18/18 0527 08/19/18 0606  NA 137 137 137  K 4.4 4.4 4.4  CL 100 103 102  CO2 21* 20* 22  GLUCOSE 107* 113* 116*  BUN 74* 73* 71*  CREATININE 10.77* 10.27* 9.65*  CALCIUM 7.4* 8.0* 8.5*  PHOS 5.2* 4.5 3.8   Liver Function Tests: Recent Labs  Lab 08/17/18 0519 08/18/18 0527 08/19/18 0606  ALBUMIN 2.1* 2.3* 2.2*   No results for input(s): LIPASE, AMYLASE in the last 168 hours. No results for input(s): AMMONIA in the last 168 hours. CBC: Recent Labs  Lab 08/15/18 0532  WBC 9.8  HGB 8.9*  HCT 26.9*  MCV 90.0  PLT 131*   Cardiac Enzymes: No results for input(s): CKTOTAL, CKMB, CKMBINDEX, TROPONINI in the last 168 hours. CBG: Recent Labs  Lab 08/18/18 1155 08/18/18 1635 08/18/18 2039 08/19/18 0745 08/19/18 1120  GLUCAP 129* 127* 170* 108* 110*    Iron Studies: No results for input(s): IRON, TIBC, TRANSFERRIN, FERRITIN in the last 72 hours. Studies/Results: No results found.  Medications: Infusions: . sodium chloride Stopped (08/09/18 2346)    Scheduled Medications: . amLODipine  10 mg Oral Daily  . aspirin EC  81 mg Oral Daily  .  calcitRIOL  0.25 mcg Oral Daily  . calcium acetate  1,334 mg Oral TID WC  . ferrous sulfate  325 mg Oral TID WC  . heparin  5,000 Units Subcutaneous Q8H  . insulin aspart  0-5 Units Subcutaneous QHS  . insulin aspart  0-9 Units Subcutaneous TID WC  . nicotine  21 mg Transdermal Daily  . pantoprazole  40 mg Oral Daily  . pravastatin  10 mg Oral q1800  . sodium bicarbonate  650 mg Oral BID  . sodium chloride flush  3 mL Intravenous Q12H  . vitamin B-12  1,000 mcg Oral Daily    have reviewed scheduled and prn medications.  Physical Exam: General: Not in  distress, comfortable Heart:RRR, s1s2 nl no rubs Lungs: Clear bilateral, no wheezing or crackle Abdomen:soft, Non-tender, non-distended Extremities: Trace lower extremity edema. Neurology: Alert, awake, following commands, no asterixis.  Thaila Bottoms Tanna Furry 08/19/2018,12:07 PM  LOS: 10 days

## 2018-08-19 NOTE — Plan of Care (Signed)
  Problem: Education: Goal: Knowledge of General Education information will improve Description Including pain rating scale, medication(s)/side effects and non-pharmacologic comfort measures Outcome: Progressing   Problem: Health Behavior/Discharge Planning: Goal: Ability to manage health-related needs will improve Outcome: Progressing   Problem: Clinical Measurements: Goal: Ability to maintain clinical measurements within normal limits will improve Outcome: Progressing Goal: Will remain free from infection Outcome: Progressing Goal: Diagnostic test results will improve Outcome: Progressing Goal: Respiratory complications will improve Outcome: Progressing Goal: Cardiovascular complication will be avoided Outcome: Progressing   Problem: Education: Goal: Knowledge of disease and its progression will improve Outcome: Progressing   Problem: Health Behavior/Discharge Planning: Goal: Ability to manage health-related needs will improve Outcome: Progressing   Problem: Clinical Measurements: Goal: Complications related to the disease process or treatment will be avoided or minimized Outcome: Progressing Goal: Dialysis access will remain free of complications Outcome: Progressing   Problem: Fluid Volume: Goal: Fluid volume balance will be maintained or improved Outcome: Progressing   Problem: Nutritional: Goal: Ability to make appropriate dietary choices will improve Outcome: Progressing   Problem: Respiratory: Goal: Respiratory symptoms related to disease process will be avoided Outcome: Progressing   Problem: Urinary Elimination: Goal: Progression of disease will be identified and treated Outcome: Progressing

## 2018-08-19 NOTE — Progress Notes (Signed)
PROGRESS NOTE  Sandra Sheppard YJE:[id number] DOB: 22-Aug-1945 DOA: 08/08/2018 PCP: Prince Solian, MD  HPI/Recap of past 24 hours:   She is less edematous, she is now on room air, denies sob,she is off ivf since 3/4  She denies pain,    she is feeling stronger, cr slowly improving  she reports prior to this hospitalization, she lives alone and works parttime at [employer] until February this year She is windowed, she has no children, she has sisters and brothers lives close by  Assessment/Plan: Principal Problem:   AKI (acute kidney injury) ([place]) Active Problems:   HYPERTENSION, MILD   CKD (chronic kidney disease) stage 3, GFR 30-59 ml/min (HCC)   Hyperkalemia   Metabolic acidosis, increased anion gap   Normocytic anemia   Diabetes mellitus type II, non insulin dependent (HCC)   Hypocalcemia   FTT (failure to thrive) in adult   Acute renal failure with acute tubular necrosis superimposed on stage 3 chronic kidney disease ([place])   Palliative care encounter  AKI (acute kidney injury) ([place]) on   CKD (chronic kidney disease) stage 3, GFR 30-59 ml/min ([place]): Baseline creatinine 1.5-2.Concern about ATN,  in the setting of ACE inhibitor and diuretic. On admission of 12.6, renal ultrasound did not show hydronephrosis. spep negative for m spike She received hydration from admission to 3/4 She started to make more urine, cr slowly improving, cr today is 9.65, gfr remain in single digit  Case discussed with nephrology today, will need to discuss with palliative care on Monday again  Generalized edema, more in bilateral arms venous US negative for DVT Improving after ivf stopped   Hyperkalemia: The setting of acute kidney injury and ACE inhibitor now improved. Resolved.  Metabolic acidosis: In the setting of acute kidney injury and metformin use. Improved with bicarbonate supplementation.  Normocytic anemia: Ferritin of 54 hemoglobin of 7.6 MCV of 87. B12 117,  S/p im B12  supplementx7 days, now on oral b12 supplement  Essential hypertension: Hydrochlorothiazide lisinopril have been held. Continue amlodipine 10 mg, blood pressure is fairly controlled.  Diabetes mellitus type II: With an A1c of 6.2. Continue sliding scale insulin.  Hyperlipidemia: Continue pravastatin.  Acute confusional state: Seroquel at bedtime prn , Haldol IV as needed. Last dose of 2/29 Appear has improved, has been calm and cooperative without needing any prn meds  GI prophylaxis: Due to significant elevated BUN she was started on Protonix empirically.  DVT prophylaxis: heparin  Code Status: partial  Family Communication: patient   Disposition Plan: recommend CIR since patient was independent, lives by herself, still works Warehouse manager as a Scientist, water quality at [employer] a few weeks ago prior to this hospitalization,    Consultants:  Nephrology  Palliative care consult per nephrology recommendation  CIR   Procedures:  none  Antibiotics:  none   Objective: BP (!) 139/54 (BP Location: Right Arm)   Pulse 70   Temp 97.9 F (36.6 C) (Oral)   Resp 20   Ht 5\' 4"  (1.626 m)   Wt 60.1 kg   SpO2 94%   BMI 22.74 kg/m   Intake/Output Summary (Last 24 hours) at 08/19/2018 1656 Last data filed at 08/19/2018 1300 Gross per 24 hour  Intake 780 ml  Output 225 ml  Net 555 ml   Filed Weights   08/16/18 2000 08/17/18 2039 08/19/18 0425  Weight: 60.4 kg 60.2 kg 60.1 kg    Exam: Patient is examined daily including today on 08/19/2018, exams remain the same as of yesterday  except that has changed    General:   less edematous , appear stronger , aaox3  Cardiovascular: RRR  Respiratory: improved aeration, no wheezing, no rales, no rhonchi  Abdomen: Soft/ND/NT, positive BS  Musculoskeletal: bilateral upper extremity pitting Edema slightly improved  Neuro: alert, oriented   Data Reviewed: Basic Metabolic Panel: Recent Labs  Lab 08/15/18 0532 08/16/18 0501  08/17/18 0519 08/18/18 0527 08/19/18 0606  NA 137 137 137 137 137  K 4.4 4.2 4.4 4.4 4.4  CL 96* 97* 100 103 102  CO2 21* 23 21* 20* 22  GLUCOSE 98 112* 107* 113* 116*  BUN 78* 76* 74* 73* 71*  CREATININE 11.05* 10.83* 10.77* 10.27* 9.65*  CALCIUM 5.7* 6.1* 7.4* 8.0* 8.5*  PHOS 6.0* 5.6* 5.2* 4.5 3.8   Liver Function Tests: Recent Labs  Lab 08/15/18 0532 08/16/18 0501 08/17/18 0519 08/18/18 0527 08/19/18 0606  ALBUMIN 2.5* 2.0* 2.1* 2.3* 2.2*   No results for input(s): LIPASE, AMYLASE in the last 168 hours. No results for input(s): AMMONIA in the last 168 hours. CBC: Recent Labs  Lab 08/15/18 0532  WBC 9.8  HGB 8.9*  HCT 26.9*  MCV 90.0  PLT 131*   Cardiac Enzymes:   No results for input(s): CKTOTAL, CKMB, CKMBINDEX, TROPONINI in the last 168 hours. BNP (last 3 results) No results for input(s): BNP in the last 8760 hours.  ProBNP (last 3 results) No results for input(s): PROBNP in the last 8760 hours.  CBG: Recent Labs  Lab 08/18/18 1635 08/18/18 2039 08/19/18 0745 08/19/18 1120 08/19/18 1629  GLUCAP 127* 170* 108* 110* 121*    No results found for this or any previous visit (from the past 240 hour(s)).   Studies: No results found.  Scheduled Meds: . amLODipine  10 mg Oral Daily  . aspirin EC  81 mg Oral Daily  . calcitRIOL  0.25 mcg Oral Daily  . calcium acetate  1,334 mg Oral TID WC  . ferrous sulfate  325 mg Oral TID WC  . heparin  5,000 Units Subcutaneous Q8H  . insulin aspart  0-5 Units Subcutaneous QHS  . insulin aspart  0-9 Units Subcutaneous TID WC  . nicotine  21 mg Transdermal Daily  . pantoprazole  40 mg Oral Daily  . pravastatin  10 mg Oral q1800  . sodium bicarbonate  650 mg Oral BID  . sodium chloride flush  3 mL Intravenous Q12H  . vitamin B-12  1,000 mcg Oral Daily    Continuous Infusions: . sodium chloride Stopped (08/09/18 2346)     Time spent: 15mins,  I have personally reviewed and interpreted on  08/19/2018 daily  labs,  imagings as discussed above under date review session and assessment and plans.  I reviewed all nursing notes, pharmacy notes, consultant notes,  vitals, pertinent old records  I have discussed plan of care as described above with RN , patient  on 08/19/2018   Florencia Reasons MD, PhD  [facility] Pager [phone]. If 7PM-7AM, please contact night-coverage at [email or web address], password [facility] 08/19/2018, 4:56 PM  LOS: 10 days

## 2018-08-20 ENCOUNTER — Encounter (HOSPITAL_COMMUNITY): Payer: Medicare HMO

## 2018-08-20 LAB — CBC WITH DIFFERENTIAL/PLATELET
Abs Immature Granulocytes: 0.07 10*3/uL (ref 0.00–0.07)
Basophils Absolute: 0.1 10*3/uL (ref 0.0–0.1)
Basophils Relative: 1 %
Eosinophils Absolute: 0.3 10*3/uL (ref 0.0–0.5)
Eosinophils Relative: 4 %
HCT: 23.9 % — ABNORMAL LOW (ref 36.0–46.0)
Hemoglobin: 7.6 g/dL — ABNORMAL LOW (ref 12.0–15.0)
Immature Granulocytes: 1 %
Lymphocytes Relative: 15 %
Lymphs Abs: 1 10*3/uL (ref 0.7–4.0)
MCH: 28.6 pg (ref 26.0–34.0)
MCHC: 31.8 g/dL (ref 30.0–36.0)
MCV: 89.8 fL (ref 80.0–100.0)
Monocytes Absolute: 0.8 10*3/uL (ref 0.1–1.0)
Monocytes Relative: 11 %
Neutro Abs: 4.7 10*3/uL (ref 1.7–7.7)
Neutrophils Relative %: 68 %
Platelets: 205 10*3/uL (ref 150–400)
RBC: 2.66 MIL/uL — ABNORMAL LOW (ref 3.87–5.11)
RDW: 14.6 % (ref 11.5–15.5)
WBC: 6.9 10*3/uL (ref 4.0–10.5)
nRBC: 0 % (ref 0.0–0.2)

## 2018-08-20 LAB — GLUCOSE, CAPILLARY
Glucose-Capillary: 115 mg/dL — ABNORMAL HIGH (ref 70–99)
Glucose-Capillary: 114 mg/dL — ABNORMAL HIGH (ref 70–99)
Glucose-Capillary: 153 mg/dL — ABNORMAL HIGH (ref 70–99)
Glucose-Capillary: 148 mg/dL — ABNORMAL HIGH (ref 70–99)

## 2018-08-20 LAB — RENAL FUNCTION PANEL
Albumin: 2.4 g/dL — ABNORMAL LOW (ref 3.5–5.0)
Anion gap: 13 (ref 5–15)
BUN: 69 mg/dL — ABNORMAL HIGH (ref 8–23)
CO2: 21 mmol/L — ABNORMAL LOW (ref 22–32)
Calcium: 9 mg/dL (ref 8.9–10.3)
Chloride: 103 mmol/L (ref 98–111)
Creatinine, Ser: 9.54 mg/dL — ABNORMAL HIGH (ref 0.44–1.00)
GFR calc Af Amer: 4 mL/min — ABNORMAL LOW (ref >60)
GFR calc non Af Amer: 4 mL/min — ABNORMAL LOW (ref >60)
Glucose, Bld: 124 mg/dL — ABNORMAL HIGH (ref 70–99)
Phosphorus: 3.8 mg/dL (ref 2.5–4.6)
Potassium: 4.4 mmol/L (ref 3.5–5.1)
Sodium: 137 mmol/L (ref 135–145)

## 2018-08-20 LAB — HEMOGLOBIN A1C
Hgb A1c MFr Bld: 6.5 % — ABNORMAL HIGH (ref 4.8–5.6)
Mean Plasma Glucose: 139.85 mg/dL

## 2018-08-20 LAB — TSH: TSH: 3.464 u[IU]/mL (ref 0.350–4.500)

## 2018-08-20 LAB — CK: Total CK: 33 U/L — ABNORMAL LOW (ref 38–234)

## 2018-08-20 LAB — PREPARE RBC (CROSSMATCH)

## 2018-08-20 MED ORDER — SODIUM CHLORIDE 0.9% IV SOLUTION: Freq: Once | INTRAVENOUS | Status: DC

## 2018-08-20 NOTE — Progress Notes (Signed)
Occupational Therapy Treatment Patient Details Name: Sandra Sheppard MRN: [id number] DOB: 31-Dec-1945 Today's Date: 08/20/2018    History of present illness Pt is a 73 y/o F with significant PMH of hypertension, type 2 diabetes mellitus, and chronic kidney disease stage III now presenting for evaluation of abnormal outpatient blood work and found to have an acute kidney injury.   OT comments  Pt making progress this session and very motivated for OT intervention. OT educated and demonstrated use of level 1 theraband for B UE strengthening exercises. Pt performed demonstrations with min cuing for 10 reps of chest pulls, shoulder flexion, and alternating punches. Pt needing rest breaks secondary to fatigue. OT educated pt on movement and use of hands in order to decrease edema. OT began education on energy conservation with education to continue. Pt remained seated in recliner chair with call bell and all needed items within reach upon exiting the room.   Follow Up Recommendations  Home health OT;Supervision/Assistance - 24 hour    Equipment Recommendations  Tub/shower bench       Precautions / Restrictions Precautions Precautions: Fall Restrictions Weight Bearing Restrictions: No       Mobility Bed Mobility       General bed mobility comments: patient in recliner upon arrival to room         ADL either performed or assessed with clinical judgement        Vision Baseline Vision/History: No visual deficits Patient Visual Report: No change from baseline            Cognition Arousal/Alertness: Awake/alert Behavior During Therapy: Flat affect Overall Cognitive Status: No family/caregiver present to determine baseline cognitive functioning Area of Impairment: Memory;Problem solving      Following Commands: Follows one step commands with increased time Safety/Judgement: Decreased awareness of safety;Decreased awareness of deficits   Problem Solving: Slow  processing;Requires verbal cues                     Pertinent Vitals/ Pain       Pain Assessment: Faces Faces Pain Scale: No hurt         Frequency  Min 2X/week        Progress Toward Goals  OT Goals(current goals can now be found in the care plan section)  Progress towards OT goals: Progressing toward goals  Acute Rehab OT Goals Patient Stated Goal: go home OT Goal Formulation: With patient Time For Goal Achievement: 09/03/18 Potential to Achieve Goals: Good  Plan Discharge plan remains appropriate       AM-PAC OT "6 Clicks" Daily Activity     Outcome Measure   Help from another person eating meals?: None Help from another person taking care of personal grooming?: A Little Help from another person toileting, which includes using toliet, bedpan, or urinal?: A Little Help from another person bathing (including washing, rinsing, drying)?: A Little Help from another person to put on and taking off regular upper body clothing?: None Help from another person to put on and taking off regular lower body clothing?: A Lot 6 Click Score: 19    End of Session    OT Visit Diagnosis: Other abnormalities of gait and mobility (R26.89);Muscle weakness (generalized) (M62.81);Other symptoms and signs involving cognitive function   Activity Tolerance Patient tolerated treatment well   Patient Left in chair;with call bell/phone within reach;with chair alarm set   Nurse Communication          Time: 1540-0867 OT Time Calculation (min):  23 min  Charges: OT General Charges $OT Visit: 1 Visit OT Treatments $Therapeutic Activity: 8-22 mins $Therapeutic Exercise: 8-22 mins   Cinsere Mizrahi P, MS, OTR/L 08/20/2018, 2:38 PM

## 2018-08-20 NOTE — Progress Notes (Signed)
[facility] ROUNDING NOTE   Subjective:   Brief history 73 year old lady history of hypertension diabetes stage III chronic kidney disease baseline serum creatinine 1.5 to 2.0 mg/dL presented with nausea vomiting and diarrhea.  BUN increased to 99 creatinine increased to 12.67 with a CO2 of 11.  Working diagnosis is ischemic acute tubular necrosis with concomitant use of lisinopril/hydrochlorothiazide and metformin.  Urinalysis was bland with no red blood cells no white blood cells and just 100 mg/dL protein.  Urine protein creatinine ratio 1.6 with a kappa lambda ratio of 2.38 ultrasound of the kidneys revealed increased echogenicity with no obstruction.  Creatinine appears to be slowly improving with no indication for dialysis.  Palliative care are involved.  Blood pressure 147/69 pulse 78 temperature 97.8 O2 sats 98% room air urine output 08/19/1998 725 cc weight 60.8 kg admission weight 61.9 kg  Amlodipine 10 mg daily pravastatin at 10 mg daily Calcitrol 0.25 mcg daily, calcium acetate 1334 mg 3 times daily, sodium bicarbonate 650 mg twice daily, iron sulfate 325 mg 3 times daily,  Labs sodium 137 potassium 4.4 chloride 103 CO2 21 glucose 124 BUN 69 creatinine 9.54 calcium 9.0 phosphorus 3.8 albumin 2.4 WBC 6.9 hemoglobin 7.6 platelets 205   Objective:  Vital signs in last 24 hours:  Temp:  [97.6 F (36.4 C)-98.2 F (36.8 C)] 97.8 F (36.6 C) (03/09 0907) Pulse Rate:  [67-100] 78 (03/09 0907) Resp:  [18-21] 18 (03/09 0907) BP: (139-151)/(54-69) 147/69 (03/09 0907) SpO2:  [94 %-98 %] 98 % (03/09 0907) Weight:  [60.8 kg] 60.8 kg (03/09 0500)  Weight change: 0.7 kg Filed Weights   08/19/18 0425 08/19/18 1959 08/20/18 0500  Weight: 60.1 kg 60.8 kg 60.8 kg    Intake/Output: I/O last 3 completed shifts: In: 740 [P.O.:740] Out: 825 [Urine:825]   Intake/Output this shift:  No intake/output data recorded.   Awake alert CVS- RRR no murmurs rubs gallops RS- CTA no  wheezes rales ABD- BS present soft non-distended EXT-trace lower extremity edema   Basic Metabolic Panel: Recent Labs  Lab 08/16/18 0501 08/17/18 0519 08/18/18 0527 08/19/18 0606 08/20/18 0632  NA 137 137 137 137 137  K 4.2 4.4 4.4 4.4 4.4  CL 97* 100 103 102 103  CO2 23 21* 20* 22 21*  GLUCOSE 112* 107* 113* 116* 124*  BUN 76* 74* 73* 71* 69*  CREATININE 10.83* 10.77* 10.27* 9.65* 9.54*  CALCIUM 6.1* 7.4* 8.0* 8.5* 9.0  PHOS 5.6* 5.2* 4.5 3.8 3.8    Liver Function Tests: Recent Labs  Lab 08/16/18 0501 08/17/18 0519 08/18/18 0527 08/19/18 0606 08/20/18 0632  ALBUMIN 2.0* 2.1* 2.3* 2.2* 2.4*   No results for input(s): LIPASE, AMYLASE in the last 168 hours. No results for input(s): AMMONIA in the last 168 hours.  CBC: Recent Labs  Lab 08/15/18 0532 08/20/18 0632  WBC 9.8 6.9  NEUTROABS  --  4.7  HGB 8.9* 7.6*  HCT 26.9* 23.9*  MCV 90.0 89.8  PLT 131* 205    Cardiac Enzymes: No results for input(s): CKTOTAL, CKMB, CKMBINDEX, TROPONINI in the last 168 hours.  BNP: Invalid input(s): POCBNP  CBG: Recent Labs  Lab 08/19/18 0745 08/19/18 1120 08/19/18 1629 08/19/18 2001 08/20/18 0720  GLUCAP 108* 110* 121* 153* 115*    Microbiology: Results for orders placed or performed during the hospital encounter of 01/15/10  Surgical pcr screen     Status: None   Collection Time: 01/14/10  1:09 PM  Result Value Ref Range Status  MRSA, PCR NEGATIVE NEGATIVE Final   Staphylococcus aureus  NEGATIVE Final    NEGATIVE        The Xpert SA Assay (FDA approved for NASAL specimens only), is one component of a comprehensive surveillance program.  It is not intended to diagnose infection nor to guide or monitor treatment.    Coagulation Studies: No results for input(s): LABPROT, INR in the last 72 hours.  Urinalysis: No results for input(s): COLORURINE, LABSPEC, PHURINE, GLUCOSEU, HGBUR, BILIRUBINUR, KETONESUR, PROTEINUR, UROBILINOGEN, NITRITE, LEUKOCYTESUR  in the last 72 hours.  Invalid input(s): APPERANCEUR    Imaging: No results found.   Medications:   . sodium chloride Stopped (08/09/18 2346)   . sodium chloride   Intravenous Once  . amLODipine  10 mg Oral Daily  . aspirin EC  81 mg Oral Daily  . calcitRIOL  0.25 mcg Oral Daily  . calcium acetate  1,334 mg Oral TID WC  . ferrous sulfate  325 mg Oral TID WC  . heparin  5,000 Units Subcutaneous Q8H  . insulin aspart  0-5 Units Subcutaneous QHS  . insulin aspart  0-9 Units Subcutaneous TID WC  . nicotine  21 mg Transdermal Daily  . pantoprazole  40 mg Oral Daily  . pravastatin  10 mg Oral q1800  . sodium bicarbonate  650 mg Oral BID  . sodium chloride flush  3 mL Intravenous Q12H  . vitamin B-12  1,000 mcg Oral Daily   sodium chloride, acetaminophen **OR** acetaminophen, haloperidol lactate, ondansetron **OR** ondansetron (ZOFRAN) IV  Assessment/ Plan:   Acute on chronic kidney disease probably secondary to ischemic ATN in the setting of dehydration and lisinopril/hydrochlorothiazide and metformin.  Creatinine appears to be slowly improving.  There is no urgent indication for dialysis at this particular time we will continue to follow urine output and creatinine.  Anemia iron saturations 29% Aranesp administered 08/13/2018 we will continue weekly Aranesp  Secondary hyperparathyroidism PTH 204 we will continue PhosLo binders 2 tablets with meals and calcitriol daily continue to follow electrolytes  Metabolic acidosis appears to be improved.  Continue to monitor  Disposition patient appears to be weak debilitated.  Palliative medicine are working with patient and family.  As the renal function appears to be improving slowly we shall continue to follow.  She will need outpatient labs once discharged on a regular basis.   LOS: [place] @TODAY @10 :35 AM

## 2018-08-20 NOTE — Progress Notes (Signed)
Talked with patient at bedside.  She is sitting in the recliner chair and complains that her butt hurts.   She tells me that she wants to be back at work at [employer] in 1 month.  She really misses it.  She is happy to be going home tomorrow and believes she will have the support of her sisters.  Discussed with Dr. Erlinda Hong who was hopeful the patient would be accepted by CIR.  Unfortunately she was not and she refuses to go to "a nursing home".    Patient will likely go home tomorrow with close follow up by primary care, nephrology, and home health.  Florentina Jenny, PA-C Palliative Medicine Pager: [phone]   15 min.

## 2018-08-20 NOTE — Plan of Care (Signed)
  Problem: Food- and Nutrition-Related Knowledge Deficit (NB-1.1) Goal: Nutrition education Description Formal process to instruct or train a patient/client in a skill or to impart knowledge to help patients/clients voluntarily manage or modify food choices and eating behavior to maintain or improve health. Outcome: Completed/Met Note:  Nutrition Education Note  RD consulted for Renal Education. Provided Choose-A-Meal Booklet to patient/family. Reviewed food groups and provided written recommended serving sizes specifically determined for patient's current nutritional status.   Explained why diet restrictions are needed and provided lists of foods to limit/avoid that are high potassium, sodium, and phosphorus. Provided specific recommendations on safer alternatives of these foods. Strongly encouraged compliance of this diet.   Discussed importance of protein intake at each meal and snack. Provided examples of how to maximize protein intake throughout the day. Discussed need for fluid restriction with dialysis, importance of minimizing weight gain between HD treatments, and renal-friendly beverage options.  Encouraged pt to discuss specific diet questions/concerns with RD at HD outpatient facility. Teach back method used.  Expect good compliance.  Body mass index is 23.01 kg/m. Pt meets criteria for normal based on current BMI.  Current diet order is Consistent Carbohydrate, patient is consuming approximately 25-75% of meals at this time. Labs and medications reviewed. No further nutrition interventions warranted at this time. RD contact information provided. If additional nutrition issues arise, please re-consult RD.  Althea Grimmer, MS, RDN, LDN Pager: [phone] Available Mondays and Fridays, 9am-2pm

## 2018-08-20 NOTE — Progress Notes (Signed)
PROGRESS NOTE  Sandra Sheppard VVZ:[id number] DOB: Sep 06, 1945 DOA: 08/08/2018 PCP: Prince Solian, MD  HPI/Recap of past 24 hours:   She continue to make urine in small amount but multiple times a day, urine output 600cc documented last 24hrs She remain edematous, but appear slowing improving,  she is now on room air, denies sob,she is off ivf since 3/4  She denies pain,    she is feeling stronger, cr slowly improving  she reports prior to this hospitalization, she lives alone and works parttime at [employer] until February this year She is windowed, she has no children, she has sisters and brothers lives close by  Assessment/Plan: Principal Problem:   AKI (acute kidney injury) ([place]) Active Problems:   HYPERTENSION, MILD   CKD (chronic kidney disease) stage 3, GFR 30-59 ml/min (HCC)   Hyperkalemia   Metabolic acidosis, increased anion gap   Normocytic anemia   Diabetes mellitus type II, non insulin dependent (HCC)   Hypocalcemia   FTT (failure to thrive) in adult   Acute renal failure with acute tubular necrosis superimposed on stage 3 chronic kidney disease ([place])   Palliative care encounter  AKI (acute kidney injury) ([place]) on   CKD (chronic kidney disease) stage 3, GFR 30-59 ml/min ([place]): Baseline creatinine 1.5-2.Concern about ATN,  in the setting of ACE inhibitor and diuretic. On admission of 12.6, renal ultrasound did not show hydronephrosis. spep negative for m spike She received hydration from admission to 3/4 She started to make more urine, cr slowly improving, cr today is 9.54, gfr remain in single digit  nephrology and palliative care following  Generalized edema, more in bilateral arms venous US upper extremity negative for DVT Upper extremity edema is improving, bilateral leg remain edematous, will get venous doppler to bilateral lower extremity   Hyperkalemia (on presentation): The setting of acute kidney injury and ACE inhibitor now  improved. Resolved.  Metabolic acidosis: In the setting of acute kidney injury and metformin use. Improved with bicarbonate supplementation.  Normocytic anemia: Ferritin of 54 hemoglobin of 7.6 MCV of 87. B12 117,  S/p im B12 supplementx7 days, now on oral b12 supplement She remain very weak, will give one unit of prbc on 3/9, Check FOBT  Essential hypertension: Hydrochlorothiazide lisinopril have been held. Continue amlodipine 10 mg, blood pressure is fairly controlled.  Diabetes mellitus type II: With an A1c of 6.2. Continue sliding scale insulin.  Hyperlipidemia: Continue pravastatin.  Acute confusional state: Seroquel at bedtime prn , Haldol IV as needed. Last dose of 2/29 Appear has improved, has been calm and cooperative without needing any prn meds  GI prophylaxis: Due to significant elevated BUN she was started on Protonix empirically.  DVT prophylaxis: heparin  Code Status: partial  Family Communication: patient   Disposition Plan: she is declined  By [employer], PT recommend home health, I am concerned that she is still too frail to live by herself, will need to maximize home health , patient states she can have family members lives with her for a short period post discharge   Consultants:  Nephrology  Palliative care consult per nephrology recommendation  CIR   Procedures:  prbc transfusion on 3/9  Antibiotics:  none   Objective: BP (!) 147/69 (BP Location: Right Arm)   Pulse 78   Temp 97.8 F (36.6 C) (Oral)   Resp 18   Ht 5\' 4"  (1.626 m)   Wt 60.8 kg   SpO2 98%   BMI 23.01 kg/m   Intake/Output Summary (  Last 24 hours) at 08/20/2018 0942 Last data filed at 08/20/2018 0600 Gross per 24 hour  Intake 440 ml  Output 500 ml  Net -60 ml   Filed Weights   08/19/18 0425 08/19/18 1959 08/20/18 0500  Weight: 60.1 kg 60.8 kg 60.8 kg    Exam: Patient is examined daily including today on 08/20/2018, exams remain the same as of yesterday  except that has changed    General:   less edematous , appear stronger , aaox3  Cardiovascular: RRR  Respiratory: improved aeration, no wheezing, no rales, no rhonchi  Abdomen: Soft/ND/NT, positive BS  Musculoskeletal: bilateral upper extremity pitting Edema slightly improved. Bilateral lower extremity remain edematous   Neuro: alert, oriented   Data Reviewed: Basic Metabolic Panel: Recent Labs  Lab 08/16/18 0501 08/17/18 0519 08/18/18 0527 08/19/18 0606 08/20/18 0632  NA 137 137 137 137 137  K 4.2 4.4 4.4 4.4 4.4  CL 97* 100 103 102 103  CO2 23 21* 20* 22 21*  GLUCOSE 112* 107* 113* 116* 124*  BUN 76* 74* 73* 71* 69*  CREATININE 10.83* 10.77* 10.27* 9.65* 9.54*  CALCIUM 6.1* 7.4* 8.0* 8.5* 9.0  PHOS 5.6* 5.2* 4.5 3.8 3.8   Liver Function Tests: Recent Labs  Lab 08/16/18 0501 08/17/18 0519 08/18/18 0527 08/19/18 0606 08/20/18 0632  ALBUMIN 2.0* 2.1* 2.3* 2.2* 2.4*   No results for input(s): LIPASE, AMYLASE in the last 168 hours. No results for input(s): AMMONIA in the last 168 hours. CBC: Recent Labs  Lab 08/15/18 0532 08/20/18 0632  WBC 9.8 6.9  NEUTROABS  --  4.7  HGB 8.9* 7.6*  HCT 26.9* 23.9*  MCV 90.0 89.8  PLT 131* 205   Cardiac Enzymes:   No results for input(s): CKTOTAL, CKMB, CKMBINDEX, TROPONINI in the last 168 hours. BNP (last 3 results) No results for input(s): BNP in the last 8760 hours.  ProBNP (last 3 results) No results for input(s): PROBNP in the last 8760 hours.  CBG: Recent Labs  Lab 08/19/18 0745 08/19/18 1120 08/19/18 1629 08/19/18 2001 08/20/18 0720  GLUCAP 108* 110* 121* 153* 115*    No results found for this or any previous visit (from the past 240 hour(s)).   Studies: No results found.  Scheduled Meds: . sodium chloride   Intravenous Once  . amLODipine  10 mg Oral Daily  . aspirin EC  81 mg Oral Daily  . calcitRIOL  0.25 mcg Oral Daily  . calcium acetate  1,334 mg Oral TID WC  . ferrous sulfate  325 mg  Oral TID WC  . heparin  5,000 Units Subcutaneous Q8H  . insulin aspart  0-5 Units Subcutaneous QHS  . insulin aspart  0-9 Units Subcutaneous TID WC  . nicotine  21 mg Transdermal Daily  . pantoprazole  40 mg Oral Daily  . pravastatin  10 mg Oral q1800  . sodium bicarbonate  650 mg Oral BID  . sodium chloride flush  3 mL Intravenous Q12H  . vitamin B-12  1,000 mcg Oral Daily    Continuous Infusions: . sodium chloride Stopped (08/09/18 2346)     Time spent: 64mins,  I have personally reviewed and interpreted on  08/20/2018 daily labs,  imagings as discussed above under date review session and assessment and plans.  I reviewed all nursing notes, pharmacy notes, consultant notes,  vitals, pertinent old records  I have discussed plan of care as described above with RN , patient  on 08/20/2018   Florencia Reasons MD, PhD  [facility] Pager [phone]. If 7PM-7AM, please contact night-coverage at [email or web address], password [facility] 08/20/2018, 9:42 AM  LOS: 11 days

## 2018-08-20 NOTE — Progress Notes (Signed)
Post void residual 1106ml.

## 2018-08-21 ENCOUNTER — Inpatient Hospital Stay (HOSPITAL_COMMUNITY): Payer: Medicare HMO

## 2018-08-21 DIAGNOSIS — N183 Chronic kidney disease, stage 3 (moderate): Secondary | ICD-10-CM

## 2018-08-21 DIAGNOSIS — N170 Acute kidney failure with tubular necrosis: Principal | ICD-10-CM

## 2018-08-21 DIAGNOSIS — R609 Edema, unspecified: Secondary | ICD-10-CM

## 2018-08-21 LAB — CBC WITH DIFFERENTIAL/PLATELET
Abs Immature Granulocytes: 0.1 10*3/uL — ABNORMAL HIGH (ref 0.00–0.07)
Basophils Absolute: 0.1 10*3/uL (ref 0.0–0.1)
Basophils Relative: 1 %
Eosinophils Absolute: 0.2 10*3/uL (ref 0.0–0.5)
Eosinophils Relative: 3 %
HCT: 33.7 % — ABNORMAL LOW (ref 36.0–46.0)
Hemoglobin: 11 g/dL — ABNORMAL LOW (ref 12.0–15.0)
Immature Granulocytes: 1 %
Lymphocytes Relative: 13 %
Lymphs Abs: 1 10*3/uL (ref 0.7–4.0)
MCH: 28.5 pg (ref 26.0–34.0)
MCHC: 32.6 g/dL (ref 30.0–36.0)
MCV: 87.3 fL (ref 80.0–100.0)
Monocytes Absolute: 0.8 10*3/uL (ref 0.1–1.0)
Monocytes Relative: 10 %
Neutro Abs: 5.8 10*3/uL (ref 1.7–7.7)
Neutrophils Relative %: 72 %
Platelets: 261 10*3/uL (ref 150–400)
RBC: 3.86 MIL/uL — ABNORMAL LOW (ref 3.87–5.11)
RDW: 15.2 % (ref 11.5–15.5)
WBC: 8.1 10*3/uL (ref 4.0–10.5)
nRBC: 0 % (ref 0.0–0.2)

## 2018-08-21 LAB — BPAM RBC
Blood Product Expiration Date: 202004052359
ISSUE DATE / TIME: 202003091139
Unit Type and Rh: 5100

## 2018-08-21 LAB — RENAL FUNCTION PANEL
Albumin: 3 g/dL — ABNORMAL LOW (ref 3.5–5.0)
Anion gap: 16 — ABNORMAL HIGH (ref 5–15)
BUN: 65 mg/dL — ABNORMAL HIGH (ref 8–23)
CO2: 20 mmol/L — ABNORMAL LOW (ref 22–32)
Calcium: 10.1 mg/dL (ref 8.9–10.3)
Chloride: 102 mmol/L (ref 98–111)
Creatinine, Ser: 9.1 mg/dL — ABNORMAL HIGH (ref 0.44–1.00)
GFR calc Af Amer: 4 mL/min — ABNORMAL LOW (ref >60)
GFR calc non Af Amer: 4 mL/min — ABNORMAL LOW (ref >60)
Glucose, Bld: 115 mg/dL — ABNORMAL HIGH (ref 70–99)
Phosphorus: 3.5 mg/dL (ref 2.5–4.6)
Potassium: 4.4 mmol/L (ref 3.5–5.1)
Sodium: 138 mmol/L (ref 135–145)

## 2018-08-21 LAB — TYPE AND SCREEN
ABO/RH(D): O POS
Antibody Screen: NEGATIVE
Unit division: 0

## 2018-08-21 LAB — GLUCOSE, CAPILLARY
Glucose-Capillary: 121 mg/dL — ABNORMAL HIGH (ref 70–99)
Glucose-Capillary: 121 mg/dL — ABNORMAL HIGH (ref 70–99)
Glucose-Capillary: 141 mg/dL — ABNORMAL HIGH (ref 70–99)
Glucose-Capillary: 124 mg/dL — ABNORMAL HIGH (ref 70–99)

## 2018-08-21 MED ORDER — DARBEPOETIN ALFA 60 MCG/0.3ML IJ SOSY
60.0000 ug | PREFILLED_SYRINGE | Freq: Once | INTRAMUSCULAR | Status: DC
  Filled 2018-08-21: qty 0.3

## 2018-08-21 NOTE — Care Management Note (Signed)
Case Management Note Manya Silvas, RN MSN CCM Transitions of Care 13[facility] [place] [phone]  Patient Details  Name: Sandra Sheppard MRN: [id number] Date of Birth: 01/13/1946  Subjective/Objective:      AKI             Action/Plan: Spoke with patient at bedside-2 sisters and a brother-in-law at bedside-patient agreed to have CM speak in front of family. PTA patient home alone. Patient expressed concerns about back steps that would be used to take dog in and out of the house, however, a neighbor will be assisting with caring for the dog. Family and patient very engaged in conversation. Family supportive, and will be assisting patient with f/u medical appointments and picking up prescriptions. Patient states that she usually has her prescriptions [facility] at [facility].   Discussed PT recommendations for HHPT. Patient agreeable. Choice offered. [facility] accepted referral. Referral made to [facility] for RW and 3/1.   CM to follow for transition of care needs.   Expected Discharge Date:  08/15/18               Expected Discharge Plan:  [place]  In-House Referral:  NA  Discharge planning Services  CM Consult  Post Acute Care Choice:  Durable Medical Equipment, Home Health Choice offered to:  Patient  DME Arranged:  3-N-1, Walker rolling DME Agency:  [facility]  HH Arranged:  PT, RN, OT, Nurse's Aide, Social Work [employer] Agency:  [facility]  Status of Service:  Completed, signed off  If discussed at [employer], dates discussed:    Additional Comments: 08/21/2018-Spoke with MD who advised that patient is to transition home today. Patient is declining opportunity for CIR. HH arranged with [facility] for RN, PT, OT, Aide, SW. Cheryl at [facility] notified of pending transition today. DME arranged with [facility]-3N1, RW.   Bartholomew Crews, RN 08/21/2018, 11:13 AM

## 2018-08-21 NOTE — Clinical Social Work Note (Signed)
Clinical Social Work Assessment  Patient Details  Name: Sandra Sheppard MRN: [id number] Date of Birth: 05/04/1946  Date of referral:  08/21/18               Reason for consult:  Facility Placement                Permission sought to share information with:  Facility Sport and exercise psychologist, Case Manager Permission granted to share information::  Yes, Verbal Permission Granted  Name:: [name]       Agency::     Relationship::   sister  Contact Information:  [phone]    Housing/Transportation Living arrangements for the past 2 months:    Source of Information:  Patient Patient Interpreter Needed:  None Criminal Activity/Legal Involvement Pertinent to Current Situation/Hospitalization:  No - Comment as needed Significant Relationships:  Siblings, Pets Lives with:  Self Do you feel safe going back to the place where you live?  Yes Need for family participation in patient care:     Care giving concerns:  Patient living alone with dog. Family not able to provide the recommended 24/7 supervision needed at this time.    Social Worker assessment / plan:  Therapies recommending skilled rehabilitation. Discussed choice with family and patient. Preferences for [facility] and [facility] d/t proximity to family.   Employment status:  Insurance claims handler PT Recommendations:  24 Hour Supervision Information / Referral to community resources:  [place]  Patient/Family's Response to care:  Spoke with patient and sister, [name], and Cathy's husband at bedside. Family supportive of patient and encouraging patient to seek rehab before going home.   Patient/Family's Understanding of and Emotional Response to Diagnosis, Current Treatment, and Prognosis:  Family understanding of slow recovery from kidney issues and need for patient to receive rehabilitation services. Patient wanting to go home, but reassured by family presence and encouragement,  and agreeable to rehabilitation.   Emotional Assessment Appearance:  Appears stated age Attitude/Demeanor/Rapport:  Apprehensive Affect (typically observed):  Calm Orientation:  Oriented to Self, Oriented to Situation, Oriented to Place, Oriented to  Time Alcohol / Substance use:  Never Used Psych involvement (Current and /or in the community):  No (Comment)  Discharge Needs  Concerns to be addressed:  No discharge needs identified Readmission within the last 30 days:  No Current discharge risk:  None Barriers to Discharge:  No SNF bed, Insurance Authorization   Bartholomew Crews, RN 08/21/2018, 12:41 PM

## 2018-08-21 NOTE — Progress Notes (Signed)
Physical Therapy Treatment Patient Details Name: Sandra Sheppard MRN: [id number] DOB: 19-Oct-1945 Today's Date: 08/21/2018    History of Present Illness Pt is a 73 y/o F with significant PMH of hypertension, type 2 diabetes mellitus, and chronic kidney disease stage III now presenting for evaluation of abnormal outpatient blood work and found to have an acute kidney injury.    PT Comments    Pt progressing slowly towards physical therapy goals. Tolerance for functional activity remains low, and at beginning of session pt adamant about returning home at d/c. Set room up to simulate home environment, and therapist let pt attempt all mobility without assist first so pt could get an idea of what to expect if she goes home. Pt unable to transition to EOB or stand up from EOB/BSC without assistance. At end of session, we again discussed the benefits of continued therapy at the SNF level and pt now agreeable due to current functional level. Feel short term rehab at the SNF level will be beneficial to maximize functional independence and return to PLOF (pt's goal to return to work at [employer]).    Follow Up Recommendations  SNF;Supervision/Assistance - 24 hour     Equipment Recommendations  Rolling walker with 5" wheels    Recommendations for Other Services       Precautions / Restrictions Precautions Precautions: Fall Restrictions Weight Bearing Restrictions: No    Mobility  Bed Mobility Overal bed mobility: Needs Assistance Bed Mobility: Supine to Sit     Supine to sit: Min assist;Mod assist     General bed mobility comments: HOB flat and rails lowered to simulate home environment. Min-mod assist provided for trunk elevation to full sitting position as pt could not complete on her own.   Transfers Overall transfer level: Needs assistance Equipment used: Rolling walker (2 wheeled) Transfers: Sit to/from Stand Sit to Stand: Min assist         General transfer comment: Heavy min  assist required for power-up to full stand. Pt had several unsuccessful attempts without assist to simulate home environment.   Ambulation/Gait Ambulation/Gait assistance: Min guard Gait Distance (Feet): 150 Feet Assistive device: Rolling walker (2 wheeled) Gait Pattern/deviations: Step-through pattern;Decreased stride length Gait velocity: decreased Gait velocity interpretation: <1.8 ft/sec, indicate of risk for recurrent falls General Gait Details: VC's for improved posture and closer walker proximity. Family present and assisting with IV pole and chair follow.    Stairs             Wheelchair Mobility    Modified Rankin (Stroke Patients Only)       Balance Overall balance assessment: Needs assistance Sitting-balance support: Feet supported Sitting balance-Leahy Scale: Good     Standing balance support: Bilateral upper extremity supported;During functional activity Standing balance-Leahy Scale: Good Standing balance comment: Pt stood at sink for grooming tasks                            Cognition Arousal/Alertness: Awake/alert Behavior During Therapy: Flat affect Overall Cognitive Status: Impaired/Different from baseline Area of Impairment: Memory;Problem solving;Safety/judgement;Following commands                       Following Commands: Follows one step commands with increased time;Follows multi-step commands inconsistently Safety/Judgement: Decreased awareness of safety;Decreased awareness of deficits   Problem Solving: Slow processing;Requires verbal cues;Decreased initiation General Comments: Pt with a significant delay when answering questions or initiating movement. Pt is HOH  however even with a purposeful raised voice pt still demonstrating these behaviors - I don't think it's hearing related.       Exercises      General Comments        Pertinent Vitals/Pain Pain Assessment: Faces Faces Pain Scale: No hurt Pain  Intervention(s): Monitored during session    Home Living Family/patient expects to be discharged to:: Private residence Living Arrangements: Alone Available Help at Discharge: Family;Available PRN/intermittently Type of Home: Apartment Home Access: Stairs to enter   Home Layout: Two level Home Equipment: None      Prior Function Level of Independence: Independent          PT Goals (current goals can now be found in the care plan section) Acute Rehab PT Goals Patient Stated Goal: go home PT Goal Formulation: With patient Time For Goal Achievement: 08/24/18 Potential to Achieve Goals: Good Progress towards PT goals: Progressing toward goals    Frequency    Min 2X/week      PT Plan Discharge plan needs to be updated;Frequency needs to be updated    Co-evaluation              AM-PAC PT "6 Clicks" Mobility   Outcome Measure  Help needed turning from your back to your side while in a flat bed without using bedrails?: A Little Help needed moving from lying on your back to sitting on the side of a flat bed without using bedrails?: A Little Help needed moving to and from a bed to a chair (including a wheelchair)?: A Little Help needed standing up from a chair using your arms (e.g., wheelchair or bedside chair)?: A Little Help needed to walk in hospital room?: A Little Help needed climbing 3-5 steps with a railing? : A Little 6 Click Score: 18    End of Session Equipment Utilized During Treatment: Gait belt Activity Tolerance: Patient tolerated treatment well;Patient limited by fatigue Patient left: in chair;with chair alarm set;with call bell/phone within reach;with nursing/sitter in room Nurse Communication: Mobility status PT Visit Diagnosis: Unsteadiness on feet (R26.81);Muscle weakness (generalized) (M62.81);Difficulty in walking, not elsewhere classified (R26.2)     Time: 1552-0802 PT Time Calculation (min) (ACUTE ONLY): 27 min  Charges:  $Gait Training:  23-37 mins                     Rolinda Roan, PT, DPT Acute Rehabilitation Services Pager: [phone] Office: [phone]    [name] 08/21/2018, 12:21 PM

## 2018-08-21 NOTE — Progress Notes (Signed)
Lower extremity venous has been completed.   Preliminary results in CV Proc.   Abram Sander 08/21/2018 10:07 AM

## 2018-08-21 NOTE — NC FL2 (Signed)
[place] LEVEL OF CARE SCREENING TOOL     IDENTIFICATION  Patient Name: Sandra Sheppard Birthdate: [birth year] Sex: female Admission Date (Current Location): 08/08/2018  [facility] and [place] Number:  Herbalist and Address:  [facility] [place], [place] [address] [place], [place] [address]      Provider Number: [id number]  Attending Physician Name and Address:  Florencia Reasons, MD  Relative Name and Phone Number:  [name] [phone]    Current Level of Care: Hospital Recommended Level of Care: [place] Prior Approval Number:    Date Approved/Denied:   PASRR Number: [id number]  Discharge Plan: SNF    Current Diagnoses: Patient Active Problem List   Diagnosis Date Noted  . Acute renal failure with acute tubular necrosis superimposed on stage 3 chronic kidney disease ([place])   . Palliative care encounter   . Hypocalcemia   . FTT (failure to thrive) in adult   . AKI (acute kidney injury) ([place]) 08/08/2018  . Hyperkalemia 08/08/2018  . Metabolic acidosis, increased anion gap 08/08/2018  . Normocytic anemia 08/08/2018  . Diabetes mellitus type II, non insulin dependent ([place]) 08/08/2018  . Bilateral carotid artery disease ([place]) 07/12/2018  . Dyslipidemia 07/12/2018  . Vomiting 07/04/2016  . Gastroenteritis 07/04/2016  . CKD (chronic kidney disease) stage 3, GFR 30-59 ml/min (HCC) 07/04/2016  . Occlusion and stenosis of carotid artery 04/28/2010  . ABDOMINAL AORTIC ANEURYSM 04/28/2010  . Peripheral vascular disease ([place]) 12/08/2008  . STENOSIS, MITRAL AND AORTIC VALVES 10/07/2008  . FIBRILLATION, ATRIAL 10/07/2008  . H/O aortic valve replacement 08/14/2008  . HYPERCHOLESTEROLEMIA  IIA 06/23/2008  . HYPERTENSION, MILD 06/23/2008  . MURMUR 06/23/2008    Orientation RESPIRATION BLADDER Height & Weight     Self, Time, Situation, Place  Normal Continent Weight: 60.8 kg Height:  5\' 4"  (162.6 cm)  BEHAVIORAL  SYMPTOMS/MOOD NEUROLOGICAL BOWEL NUTRITION STATUS      Continent Diet  AMBULATORY STATUS COMMUNICATION OF NEEDS Skin   Limited Assist Verbally Bruising, Other (Comment)(MASD to buttocks-eccymosis to arms/legs)                       Personal Care Assistance Level of Assistance  Bathing, Dressing, Feeding Bathing Assistance: Limited assistance Feeding assistance: Limited assistance Dressing Assistance: Limited assistance     Functional Limitations Info  Sight, Hearing, Speech Sight Info: Adequate Hearing Info: Adequate Speech Info: Adequate    SPECIAL CARE FACTORS FREQUENCY  PT (By licensed PT), OT (By licensed OT)     PT Frequency: evaluated in hospital 2/28. PT at SNF eval and treat.  OT Frequency: evaluated in hospital 3/5. OT at SNF eval and treat.            Contractures Contractures Info: Not present    Additional Factors Info  Code Status Code Status Info: No intubation/vent              Current Medications (08/21/2018):  This is the current hospital active medication list Current Facility-Administered Medications  Medication Dose Route Frequency Provider Last Rate Last Dose  . 0.9 %  sodium chloride infusion (Manually program via Guardrails IV Fluids)   Intravenous Once Florencia Reasons, MD      . 0.9 %  sodium chloride infusion   Intravenous PRN Norval Morton, MD   Stopped at 08/09/18 2346  . acetaminophen (TYLENOL) tablet 650 mg  650 mg Oral Q6H PRN Opyd, Ilene Qua, MD   650 mg  at 08/14/18 1844   Or  . acetaminophen (TYLENOL) suppository 650 mg  650 mg Rectal Q6H PRN Opyd, Ilene Qua, MD      . amLODipine (NORVASC) tablet 10 mg  10 mg Oral Daily Tamala Julian, Rondell A, MD   10 mg at 08/21/18 1037  . aspirin EC tablet 81 mg  81 mg Oral Daily Opyd, Ilene Qua, MD   81 mg at 08/21/18 1037  . calcitRIOL (ROCALTROL) capsule 0.25 mcg  0.25 mcg Oral Daily Rosita Fire, MD   0.25 mcg at 08/21/18 1037  . calcium acetate (PHOSLO) capsule 1,334 mg  1,334 mg Oral TID  WC Rosita Fire, MD   1,334 mg at 08/21/18 1037  . Darbepoetin Alfa (ARANESP) injection 60 mcg  60 mcg Subcutaneous Once Florencia Reasons, MD      . ferrous sulfate tablet 325 mg  325 mg Oral TID WC Charlynne Cousins, MD   325 mg at 08/21/18 0818  . haloperidol lactate (HALDOL) injection 1 mg  1 mg Intravenous Q6H PRN Charlynne Cousins, MD      . heparin injection 5,000 Units  5,000 Units Subcutaneous Q8H Vianne Bulls, MD   5,000 Units at 08/21/18 0606  . insulin aspart (novoLOG) injection 0-5 Units  0-5 Units Subcutaneous QHS Opyd, Timothy S, MD      . insulin aspart (novoLOG) injection 0-9 Units  0-9 Units Subcutaneous TID WC Opyd, Ilene Qua, MD   1 Units at 08/21/18 0817  . nicotine (NICODERM CQ - dosed in mg/24 hours) patch 21 mg  21 mg Transdermal Daily Charlynne Cousins, MD   21 mg at 08/21/18 1043  . ondansetron (ZOFRAN) tablet 4 mg  4 mg Oral Q6H PRN Opyd, Ilene Qua, MD   4 mg at 08/09/18 1734   Or  . ondansetron (ZOFRAN) injection 4 mg  4 mg Intravenous Q6H PRN Opyd, Ilene Qua, MD   4 mg at 08/13/18 1205  . pantoprazole (PROTONIX) EC tablet 40 mg  40 mg Oral Daily Tamala Julian, Rondell A, MD   40 mg at 08/21/18 1038  . pravastatin (PRAVACHOL) tablet 10 mg  10 mg Oral q1800 Opyd, Ilene Qua, MD   10 mg at 08/20/18 1723  . sodium bicarbonate tablet 650 mg  650 mg Oral BID Rosita Fire, MD   650 mg at 08/21/18 1037  . sodium chloride flush (NS) 0.9 % injection 3 mL  3 mL Intravenous Q12H Opyd, Ilene Qua, MD   3 mL at 08/21/18 1039  . vitamin B-12 (CYANOCOBALAMIN) tablet 1,000 mcg  1,000 mcg Oral Daily Florencia Reasons, MD   1,000 mcg at 08/21/18 1037     Discharge Medications: Please see discharge summary for a list of discharge medications.  Relevant Imaging Results:  Relevant Lab Results:   Additional Information    Bartholomew Crews, RN

## 2018-08-21 NOTE — Progress Notes (Addendum)
PROGRESS NOTE  Sandra Sheppard EUM:[id number] DOB: 11-Aug-1945 DOA: 08/08/2018 PCP: Prince Solian, MD  Brief Summary:  Sandra Sheppard is a 73 y.o. female with medical history significant for hypertension, hyperlipidemia, type 2 diabetes mellitus, and chronic kidney disease stage III, now presenting to the emergency department for evaluation of abnormal outpatient blood work.  Patient reports that she developed an earache last week, was seen by her PCP, states that she was diagnosed with an infection and started on Levaquin.  On 08/04/2018, she developed nausea with nonbloody vomiting and watery diarrhea.  The nausea, vomiting, and diarrhea have begun to improve with last episode of diarrhea this morning.  There is no blood in her vomit or diarrhea and she denies any abdominal pain or fevers.  She denies any flank pain.  She has not noticed any change in her urination.  She denies any chest pain or palpitations.  Other than the antibiotic, she denies starting any new medications. The ear pain has resolved.     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, she has no children, she has sisters and brothers lives close by  Assessment/Plan: Principal Problem:   AKI (acute kidney injury) ([place]) Active Problems:   HYPERTENSION, MILD   CKD (chronic kidney disease) stage 3, GFR 30-59 ml/min (HCC)   Hyperkalemia   Metabolic acidosis, increased anion gap   Normocytic anemia   Diabetes mellitus type II, non insulin dependent (HCC)   Hypocalcemia   FTT (failure to thrive) in adult   Acute renal failure with acute tubular  necrosis superimposed on stage 3 chronic kidney disease ([place])   Palliative care encounter  AKI (acute kidney injury) ([place]) on   CKD (chronic kidney disease) stage 3, GFR 30-59 ml/min ([place]): Baseline creatinine 1.5-2.Concern about ATN,  in the setting of ACE inhibitor and diuretic. On admission of 12.6, renal ultrasound did not show hydronephrosis. spep negative for m spike She received hydration from admission to 3/4 She started to make more urine, cr slowly improving, am lab still pending at this time, gfr remain in single digit  Patient is declined by CIR, she is too weak to go home, Case discussed with nephrology Dr Justin Mend on 3/10 who prefers to keep patient in the hospital for close  monitoring, patient may be able to discharge to SNF if renal function showed significant improvement, will need nephrology clearance prior to discharge.  palliative care consulted and signed off.  Generalized edema, more in bilateral arms venous US upper extremity  And lower extremity negative for DVT Edema appear slowly improving   Hyperkalemia (on presentation): The setting of acute kidney injury and ACE inhibitor now improved. Resolved.  Metabolic acidosis: In the setting of acute kidney injury and metformin use. Improved with bicarbonate supplementation.  Normocytic anemia: Ferritin of 54 hemoglobin of 7.6 MCV of 87. B12 117,  S/p im B12 supplementx7 days, now on oral b12 supplement She remain very weak, s/p one unit of prbc on 3/9 for symptomatic anemia On overt bleeding, FOBT ordered pending collection.  Essential hypertension: Hydrochlorothiazide lisinopril have been held.  Continue amlodipine 10 mg, blood pressure is fairly controlled.  Diabetes mellitus type II: With an A1c of 6.2. Continue sliding scale insulin.  Hyperlipidemia: Continue pravastatin.  Acute confusional state: Seroquel at bedtime prn , Haldol IV as needed. Last dose of 2/29 Appear has improved, has been calm  and cooperative without needing any prn meds  GI prophylaxis: Due to significant elevated BUN she was started on Protonix empirically.  DVT prophylaxis: heparin  Code Status: partial  Family Communication: patient   Disposition Plan: she is declined  By CIR, will need SNF once cleared by nephrology   Consultants:  Nephrology  Palliative care consult per nephrology recommendation  CIR   Procedures:  prbc transfusion on 3/9  Antibiotics:  none   Objective: BP (!) 151/65 (BP Location: Right Arm)   Pulse 80   Temp 97.8 F (36.6 C) (Oral)   Resp 18   Ht 5\' 4"  (1.626 m)   Wt 60.8 kg   SpO2 98%   BMI 23.01 kg/m   Intake/Output Summary (Last 24 hours) at 08/21/2018 1208 Last data filed at 08/21/2018 0900 Gross per 24 hour  Intake 1642 ml  Output 850 ml  Net 792 ml   Filed Weights   08/19/18 1959 08/20/18 0500 08/20/18 2055  Weight: 60.8 kg 60.8 kg 60.8 kg    Exam: Patient is examined daily including today on 08/21/2018, exams remain the same as of yesterday except that has changed    General:   less edematous , frail , aaox3  Cardiovascular: RRR  Respiratory: improved aeration, no wheezing, no rales, no rhonchi  Abdomen: Soft/ND/NT, positive BS  Musculoskeletal: bilateral upper extremity pitting Edema slightly improved. Bilateral lower extremity remain edematous   Neuro: alert, oriented   Data Reviewed: Basic Metabolic Panel: Recent Labs  Lab 08/16/18 0501 08/17/18 0519 08/18/18 0527 08/19/18 0606 08/20/18 0632  NA 137 137 137 137 137  K 4.2 4.4 4.4 4.4 4.4  CL 97* 100 103 102 103  CO2 23 21* 20* 22 21*  GLUCOSE 112* 107* 113* 116* 124*  BUN 76* 74* 73* 71* 69*  CREATININE 10.83* 10.77* 10.27* 9.65* 9.54*  CALCIUM 6.1* 7.4* 8.0* 8.5* 9.0  PHOS 5.6* 5.2* 4.5 3.8 3.8   Liver Function Tests: Recent Labs  Lab 08/16/18 0501 08/17/18 0519 08/18/18 0527 08/19/18 0606 08/20/18 0632  ALBUMIN 2.0* 2.1* 2.3* 2.2* 2.4*   No results for  input(s): LIPASE, AMYLASE in the last 168 hours. No results for input(s): AMMONIA in the last 168 hours. CBC: Recent Labs  Lab 08/15/18 0532 08/20/18 0632 08/21/18 1138  WBC 9.8 6.9 8.1  NEUTROABS  --  4.7 5.8  HGB 8.9* 7.6* 11.0*  HCT 26.9* 23.9* 33.7*  MCV 90.0 89.8 87.3  PLT 131* 205 261   Cardiac Enzymes:   Recent Labs  Lab 08/20/18 1007  CKTOTAL 33*   BNP (last 3 results) No results for input(s): BNP in the last 8760 hours.  ProBNP (last 3 results) No results for input(s): PROBNP in the last 8760 hours.  CBG: Recent Labs  Lab 08/20/18 1100 08/20/18 1610 08/20/18 2054 08/21/18 0648 08/21/18 1129  GLUCAP 114* 153* 148* 121* 121*    No results found for this or any previous visit (from the past 240 hour(s)).   Studies: Vas [place] Lower Extremity Venous (dvt)  Result Date: 08/21/2018  Lower Venous Study Indications: Edema.  Performing Technologist: Abram Sander RVS  Examination Guidelines: A complete evaluation includes B-mode imaging, spectral Doppler, color Doppler, and  power Doppler as needed of all accessible portions of each vessel. Bilateral testing is considered an integral part of a complete examination. Limited examinations for reoccurring indications may be performed as noted.  Right Venous Findings: +---------+---------------+---------+-----------+----------+-------+          CompressibilityPhasicitySpontaneityPropertiesSummary +---------+---------------+---------+-----------+----------+-------+ CFV      Full           Yes      Yes                          +---------+---------------+---------+-----------+----------+-------+ SFJ      Full                                                 +---------+---------------+---------+-----------+----------+-------+ FV Prox  Full                                                 +---------+---------------+---------+-----------+----------+-------+ FV Mid   Full                                                  +---------+---------------+---------+-----------+----------+-------+ FV DistalFull                                                 +---------+---------------+---------+-----------+----------+-------+ PFV      Full                                                 +---------+---------------+---------+-----------+----------+-------+ POP      Full           Yes      Yes                          +---------+---------------+---------+-----------+----------+-------+ PTV      Full                                                 +---------+---------------+---------+-----------+----------+-------+ PERO     Full                                                 +---------+---------------+---------+-----------+----------+-------+  Left Venous Findings: +---------+---------------+---------+-----------+----------+--------------+          CompressibilityPhasicitySpontaneityPropertiesSummary        +---------+---------------+---------+-----------+----------+--------------+ CFV      Full           Yes      Yes                                 +---------+---------------+---------+-----------+----------+--------------+  SFJ      Full                                                        +---------+---------------+---------+-----------+----------+--------------+ FV Prox  Full                                                        +---------+---------------+---------+-----------+----------+--------------+ FV Mid   Full                                                        +---------+---------------+---------+-----------+----------+--------------+ FV DistalFull                                                        +---------+---------------+---------+-----------+----------+--------------+ PFV      Full                                                        +---------+---------------+---------+-----------+----------+--------------+ POP      Full            Yes      Yes                                 +---------+---------------+---------+-----------+----------+--------------+ PTV      Full                                                        +---------+---------------+---------+-----------+----------+--------------+ PERO                                                  Not visualized +---------+---------------+---------+-----------+----------+--------------+    Summary: Right: There is no evidence of deep vein thrombosis in the lower extremity. No cystic structure found in the popliteal fossa. Left: There is no evidence of deep vein thrombosis in the lower extremity. No cystic structure found in the popliteal fossa.  *See table(s) above for measurements and observations.    Preliminary     Scheduled Meds: . sodium chloride   Intravenous Once  . amLODipine  10 mg Oral Daily  . aspirin EC  81 mg Oral Daily  . calcitRIOL  0.25 mcg Oral Daily  . calcium acetate  1,334 mg Oral TID WC  . darbepoetin (ARANESP) injection - NON-DIALYSIS  60 mcg Subcutaneous Once  . ferrous  sulfate  325 mg Oral TID WC  . heparin  5,000 Units Subcutaneous Q8H  . insulin aspart  0-5 Units Subcutaneous QHS  . insulin aspart  0-9 Units Subcutaneous TID WC  . nicotine  21 mg Transdermal Daily  . pantoprazole  40 mg Oral Daily  . pravastatin  10 mg Oral q1800  . sodium bicarbonate  650 mg Oral BID  . sodium chloride flush  3 mL Intravenous Q12H  . vitamin B-12  1,000 mcg Oral Daily    Continuous Infusions: . sodium chloride Stopped (08/09/18 2346)     Time spent: 66mins,  Case discussed with nephrology I have personally reviewed and interpreted on  08/21/2018 daily labs,  imagings as discussed above under date review session and assessment and plans.  I reviewed all nursing notes, pharmacy notes, consultant notes,  vitals, pertinent old records  I have discussed plan of care as described above with RN , patient  on 08/21/2018   Florencia Reasons MD,  PhD  [facility] Pager [phone]. If 7PM-7AM, please contact night-coverage at [email or web address], password [facility] 08/21/2018, 12:08 PM  LOS: 12 days

## 2018-08-21 NOTE — Progress Notes (Signed)
[facility] ROUNDING NOTE   Subjective:   Brief history 73 year old lady history of hypertension diabetes stage III chronic kidney disease baseline serum creatinine 1.5 to 2.0 mg/dL presented with nausea vomiting and diarrhea.  BUN increased to 99 creatinine increased to 12.67 with a CO2 of 11.  Working diagnosis is ischemic acute tubular necrosis with concomitant use of lisinopril/hydrochlorothiazide and metformin.  Urinalysis was bland with no red blood cells no white blood cells and just 100 mg/dL protein.  Urine protein creatinine ratio 1.6 with a kappa lambda ratio of 2.38.  No M spike noted.  Ultrasound of the kidneys revealed increased echogenicity with no obstruction.  Creatinine appears to be slowly improving with no indication for dialysis.  Palliative care are involved.  Pressure 153/65 pulse 79 temperature 97.8 O2 sats 96% room air  Amlodipine 10 mg daily pravastatin at 10 mg daily Calcitrol 0.25 mcg daily, calcium acetate 1334 mg 3 times daily, sodium bicarbonate 650 mg twice daily, iron sulfate 325 mg 3 times daily, nicotine patch, Protonix 40 mg daily, aspirin 81 mg daily  Sodium 137 potassium 4.4 chloride 103 CO2 21 glucose 124 BUN 69 creatinine 9.54 phosphorus 3.8 albumin 2.4 calcium 9.0  Objective:  Vital signs in last 24 hours:  Temp:  [97.8 F (36.6 C)-98.5 F (36.9 C)] 97.8 F (36.6 C) (03/10 0435) Pulse Rate:  [76-93] 79 (03/10 0435) Resp:  [18] 18 (03/10 0435) BP: (148-153)/(59-70) 153/65 (03/10 0435) SpO2:  [96 %-99 %] 96 % (03/10 0435) Weight:  [60.8 kg] 60.8 kg (03/09 2055)  Weight change: 0 kg Filed Weights   08/19/18 1959 08/20/18 0500 08/20/18 2055  Weight: 60.8 kg 60.8 kg 60.8 kg    Intake/Output: I/O last 3 completed shifts: In: 17 [P.O.:1260; Blood:382] Out: 1000 [Urine:1000]   Intake/Output this shift:  Total I/O In: -  Out: 62 [Urine:50]   Awake alert CVS- RRR no murmurs rubs gallops RS- CTA no wheezes rales ABD- BS  present soft non-distended EXT-trace lower extremity edema   Basic Metabolic Panel: Recent Labs  Lab 08/16/18 0501 08/17/18 0519 08/18/18 0527 08/19/18 0606 08/20/18 0632  NA 137 137 137 137 137  K 4.2 4.4 4.4 4.4 4.4  CL 97* 100 103 102 103  CO2 23 21* 20* 22 21*  GLUCOSE 112* 107* 113* 116* 124*  BUN 76* 74* 73* 71* 69*  CREATININE 10.83* 10.77* 10.27* 9.65* 9.54*  CALCIUM 6.1* 7.4* 8.0* 8.5* 9.0  PHOS 5.6* 5.2* 4.5 3.8 3.8    Liver Function Tests: Recent Labs  Lab 08/16/18 0501 08/17/18 0519 08/18/18 0527 08/19/18 0606 08/20/18 0632  ALBUMIN 2.0* 2.1* 2.3* 2.2* 2.4*   No results for input(s): LIPASE, AMYLASE in the last 168 hours. No results for input(s): AMMONIA in the last 168 hours.  CBC: Recent Labs  Lab 08/15/18 0532 08/20/18 0632  WBC 9.8 6.9  NEUTROABS  --  4.7  HGB 8.9* 7.6*  HCT 26.9* 23.9*  MCV 90.0 89.8  PLT 131* 205    Cardiac Enzymes: Recent Labs  Lab 08/20/18 1007  CKTOTAL 33*    BNP: Invalid input(s): POCBNP  CBG: Recent Labs  Lab 08/20/18 0720 08/20/18 1100 08/20/18 1610 08/20/18 2054 08/21/18 0648  GLUCAP 115* 114* 153* 148* 121*    Microbiology: Results for orders placed or performed during the hospital encounter of 01/15/10  Surgical pcr screen     Status: None   Collection Time: 01/14/10  1:09 PM  Result Value Ref Range Status   MRSA, PCR  NEGATIVE NEGATIVE Final   Staphylococcus aureus  NEGATIVE Final    NEGATIVE        The Xpert SA Assay (FDA approved for NASAL specimens only), is one component of a comprehensive surveillance program.  It is not intended to diagnose infection nor to guide or monitor treatment.    Coagulation Studies: No results for input(s): LABPROT, INR in the last 72 hours.  Urinalysis: No results for input(s): COLORURINE, LABSPEC, PHURINE, GLUCOSEU, HGBUR, BILIRUBINUR, KETONESUR, PROTEINUR, UROBILINOGEN, NITRITE, LEUKOCYTESUR in the last 72 hours.  Invalid input(s): APPERANCEUR     Imaging: No results found.   Medications:   . sodium chloride Stopped (08/09/18 2346)   . sodium chloride   Intravenous Once  . amLODipine  10 mg Oral Daily  . aspirin EC  81 mg Oral Daily  . calcitRIOL  0.25 mcg Oral Daily  . calcium acetate  1,334 mg Oral TID WC  . ferrous sulfate  325 mg Oral TID WC  . heparin  5,000 Units Subcutaneous Q8H  . insulin aspart  0-5 Units Subcutaneous QHS  . insulin aspart  0-9 Units Subcutaneous TID WC  . nicotine  21 mg Transdermal Daily  . pantoprazole  40 mg Oral Daily  . pravastatin  10 mg Oral q1800  . sodium bicarbonate  650 mg Oral BID  . sodium chloride flush  3 mL Intravenous Q12H  . vitamin B-12  1,000 mcg Oral Daily   sodium chloride, acetaminophen **OR** acetaminophen, haloperidol lactate, ondansetron **OR** ondansetron (ZOFRAN) IV  Assessment/ Plan:   Acute on chronic kidney disease probably secondary to ischemic ATN in the setting of dehydration and lisinopril/hydrochlorothiazide and metformin.  Creatinine appears to be slowly improving.  There is no urgent indication for dialysis at this particular time we will continue to follow urine output and creatinine.  Results pending.  Labs have not been collected yet  Anemia iron saturations 29% Aranesp administered 08/13/2018 we will continue weekly Aranesp.  Will schedule another dose of 60 mcg Aranesp 08/21/2018  Secondary hyperparathyroidism PTH 204 we will continue PhosLo binders 2 tablets with meals and calcitriol daily continue to follow electrolytes  Metabolic acidosis appears to be improved.  Continue to monitor  Disposition patient appears to be weak debilitated.  Palliative medicine are working with patient and family.  As the renal function appears to be improving slowly we shall continue to follow.  She will need outpatient labs once discharged on a regular basis.   LOS: [place] @TODAY @9 :11 AM

## 2018-08-22 LAB — RENAL FUNCTION PANEL
Albumin: 2.7 g/dL — ABNORMAL LOW (ref 3.5–5.0)
Anion gap: 12 (ref 5–15)
BUN: 64 mg/dL — ABNORMAL HIGH (ref 8–23)
CO2: 23 mmol/L (ref 22–32)
Calcium: 9.9 mg/dL (ref 8.9–10.3)
Chloride: 104 mmol/L (ref 98–111)
Creatinine, Ser: 8.65 mg/dL — ABNORMAL HIGH (ref 0.44–1.00)
GFR calc Af Amer: 5 mL/min — ABNORMAL LOW (ref >60)
GFR calc non Af Amer: 4 mL/min — ABNORMAL LOW (ref >60)
Glucose, Bld: 109 mg/dL — ABNORMAL HIGH (ref 70–99)
Phosphorus: 3.5 mg/dL (ref 2.5–4.6)
Potassium: 4.4 mmol/L (ref 3.5–5.1)
Sodium: 139 mmol/L (ref 135–145)

## 2018-08-22 LAB — GLUCOSE, CAPILLARY
Glucose-Capillary: 119 mg/dL — ABNORMAL HIGH (ref 70–99)
Glucose-Capillary: 134 mg/dL — ABNORMAL HIGH (ref 70–99)
Glucose-Capillary: 120 mg/dL — ABNORMAL HIGH (ref 70–99)
Glucose-Capillary: 164 mg/dL — ABNORMAL HIGH (ref 70–99)

## 2018-08-22 MED ORDER — CYANOCOBALAMIN 1000 MCG/ML IJ SOLN
1000.0000 ug | Freq: Once | INTRAMUSCULAR | Status: AC
  Administered 2018-08-22: 1000 ug via INTRAMUSCULAR
  Filled 2018-08-22: qty 1

## 2018-08-22 MED ORDER — CHOLECALCIFEROL 10 MCG/ML (400 UNIT/ML) PO LIQD
400.0000 [IU] | Freq: Every day | ORAL | Status: DC
  Administered 2018-08-22 – 2018-08-23 (×2): 400 [IU] via ORAL
  Filled 2018-08-22 (×3): qty 1

## 2018-08-22 NOTE — Clinical Social Work Placement (Addendum)
   CLINICAL SOCIAL WORK PLACEMENT  NOTE [facility]  Date:  08/22/2018  Patient Details  Name: ELYA TARQUINIO MRN: [id number] Date of Birth: Feb 12, 1946  Clinical Social Work is seeking post-discharge placement for this patient at the [place] level of care (*CSW will initial, date and re-position this form in  chart as items are completed):  Yes   Patient/family provided with [place] Work Department's list of facilities offering this level of care within the geographic area requested by the patient (or if unable, by the patient's family).  Yes   Patient/family informed of their freedom to choose among providers that offer the needed level of care, that participate in Medicare, Medicaid or managed care program needed by the patient, have an available bed and are willing to accept the patient.  Yes   Patient/family informed of [place] [place]'s ownership interest in [facility] and [facility], as well as of the fact that they are under no obligation to receive care at these facilities.  PASRR submitted to EDS on 08/21/18     PASRR number received on 08/21/18     Existing PASRR number confirmed on       FL2 transmitted to all facilities in geographic area requested by pt/family on 08/21/18     FL2 transmitted to all facilities within larger geographic area on       Patient informed that his/her managed care company has contracts with or will negotiate with certain facilities, including the following:        Yes   Patient/family informed of bed offers received.  Patient chooses bed at [facility]     Physician recommends and patient chooses bed at      Patient to be transferred to [facility] on  .  Patient to be transferred to facility by       Patient family notified on   of transfer.  Name of family member notified:        PHYSICIAN       Additional Comment:  08/22/2018-spoke with family and patient at  bedside. Choice made. Facility notified. Discussed authorization process. Will continue to follow.  _______________________________________________ Bartholomew Crews, RN 08/22/2018, 1:59 PM

## 2018-08-22 NOTE — Care Management Important Message (Signed)
Important Message  Patient Details  Name: Sandra Sheppard MRN: [id number] Date of Birth: 03-31-1946   Medicare Important Message Given:  Yes    Orbie Pyo 08/22/2018, 3:39 PM

## 2018-08-22 NOTE — Progress Notes (Signed)
Occupational Therapy Treatment Patient Details Name: Sandra Sheppard MRN: [id number] DOB: Nov 01, 1945 Today's Date: 08/22/2018    History of present illness Pt is a 73 y/o F with significant PMH of hypertension, type 2 diabetes mellitus, and chronic kidney disease stage III now presenting for evaluation of abnormal outpatient blood work and found to have an acute kidney injury.   OT comments  Pt progressing well toward OT goals. Continues to demonstrate decreased processing speed and is slow to respond to directions. Performed toileting and 2 activities in standing at sink followed by ambulation to balcony to enjoy warm outside temperature briefly. Pt with increased activity tolerance. Updated d/c to SNF as pt lives alone for further rehab. Pt is reluctantly in agreement.  Follow Up Recommendations  SNF;Supervision/Assistance - 24 hour    Equipment Recommendations  Tub/shower bench    Recommendations for Other Services      Precautions / Restrictions Precautions Precautions: Fall       Mobility Bed Mobility               General bed mobility comments: pt received in chair  Transfers Overall transfer level: Needs assistance Equipment used: Rolling walker (2 wheeled) Transfers: Sit to/from Stand Sit to Stand: Supervision              Balance Overall balance assessment: Needs assistance   Sitting balance-Leahy Scale: Good       Standing balance-Leahy Scale: Fair Standing balance comment: able to manage clothing with toileting and stand at sink statically without support                           ADL either performed or assessed with clinical judgement   ADL Overall ADL's : Needs assistance/impaired     Grooming: Wash/dry hands;Standing;Supervision/safety;Brushing hair           Upper Body Dressing : Set up;Sitting Upper Body Dressing Details (indicate cue type and reason): front opening gown     Toilet Transfer:  Supervision/safety;Ambulation;RW   Toileting- Clothing Manipulation and Hygiene: Supervision/safety;Sit to/from stand       Functional mobility during ADLs: Supervision/safety;Rolling walker General ADL Comments: took pt to balcony after toileting and grooming     Vision       Perception     Praxis      Cognition Arousal/Alertness: Awake/alert Behavior During Therapy: WFL for tasks assessed/performed(affect improved after going outdoors) Overall Cognitive Status: Impaired/Different from baseline Area of Impairment: Problem solving;Following commands                       Following Commands: Follows one step commands with increased time;Follows multi-step commands inconsistently     Problem Solving: Slow processing;Requires verbal cues;Decreased initiation General Comments: continues to have a response delay        Exercises     Shoulder Instructions       General Comments      Pertinent Vitals/ Pain       Pain Assessment: No/denies pain  Home Living                                          Prior Functioning/Environment              Frequency  Min 2X/week        Progress Toward Goals  OT Goals(current  goals can now be found in the care plan section)  Progress towards OT goals: Progressing toward goals  Acute Rehab OT Goals Patient Stated Goal: return home to her dog OT Goal Formulation: With patient Time For Goal Achievement: 09/03/18 Potential to Achieve Goals: Good  Plan Discharge plan remains appropriate    Co-evaluation                 AM-PAC OT "6 Clicks" Daily Activity     Outcome Measure   Help from another person eating meals?: None Help from another person taking care of personal grooming?: A Little Help from another person toileting, which includes using toliet, bedpan, or urinal?: A Little Help from another person bathing (including washing, rinsing, drying)?: A Little Help from another  person to put on and taking off regular upper body clothing?: None Help from another person to put on and taking off regular lower body clothing?: A Little 6 Click Score: 20    End of Session Equipment Utilized During Treatment: Gait belt;Rolling walker  OT Visit Diagnosis: Other abnormalities of gait and mobility (R26.89);Muscle weakness (generalized) (M62.81);Other symptoms and signs involving cognitive function   Activity Tolerance Patient tolerated treatment well   Patient Left in chair;with call bell/phone within reach;with family/visitor present   Nurse Communication          Time: 1450-1510 OT Time Calculation (min): 20 min  Charges: OT General Charges $OT Visit: 1 Visit OT Treatments $Self Care/Home Management : 8-22 mins  Nestor Lewandowsky, OTR/L Acute Rehabilitation Services Pager: [phone] Office: [phone]   Malka So 08/22/2018, 3:25 PM

## 2018-08-22 NOTE — Progress Notes (Signed)
PROGRESS NOTE    Sandra Sheppard  RDE:[id number] DOB: 1946/05/24 DOA: 08/08/2018 PCP: Prince Solian, MD   Brief Narrative: 73 year old past medical history significant for hypertension, diabetes, stage III chronic kidney disease with a creatinine baseline 1.5-2.0 who presented with nausea vomiting and diarrhea.  She was found to be in acute acute on chronic renal failure with a creatinine at 12.  Nephrologist was consulted, working diagnosis is ischemic acute tubular necrosis with concomitant use of lisinopril/hydrochlorothiazide and metformin.  M spike was negative.  Renal ultrasound was negative for hydronephrosis.  Patient was treated with IV fluids.  Subsequently creatinine function has been improving slowly.    Assessment & Plan:   Principal Problem:   AKI (acute kidney injury) ([place]) Active Problems:   HYPERTENSION, MILD   CKD (chronic kidney disease) stage 3, GFR 30-59 ml/min ([facility])   Hyperkalemia   Metabolic acidosis, increased anion gap   Normocytic anemia   Diabetes mellitus type II, non insulin dependent ([facility])   Hypocalcemia   FTT (failure to thrive) in adult   Acute renal failure with acute tubular necrosis superimposed on stage 3 chronic kidney disease ([place])   Palliative care encounter   1-acute on chronic kidney disease a stage III in the setting of probably ischemic ATN in the setting of dehydration and lisinopril/hydrochlorothiazide and metformin use. She was treated with IV fluids. Held creatinine has decreased today to 8. Per nephrology okay to discharge patient with close follow-up.  Patient will need to be met in 3 to 4 days. Will need to follow-up with nephrology.  2-generalized edema, bilateral arm edema. Venous Doppler lower extremity negative for DVT. Improving.  3-Hyperkalemia on admission.  Related to AKI and ace inhibitor.  Resolved. 4-normocytic anemia: B12 low at 117.  She has received intramuscular supplements.  Continue with oral supplements.   Monitor hemoglobin.  Hypertension: Hydrochlorothiazide and lisinopril have been discontinued due to AKI. Started on Norvasc.  Diabetes: Continue with a sliding scale insulin.  Hyperlipidemia: Continue with pravastatin.  Acute confusional state: Seroquel at bedtime as needed.  Haldol IV as needed.  Last dose of 12/29.  Improved.  Folic acidosis in the setting of renal failure.  Improving.   Estimated body mass index is 22.63 kg/m as calculated from the following:   Height as of this encounter: '5\' 4"'  (1.626 m).   Weight as of this encounter: 59.8 kg.   DVT prophylaxis: Heparin Code Status: Partial code Family Communication: Care discussed with patient Disposition Plan: She will require a skilled nursing facility.  Awaiting bed   Consultants:  = Nephrology Palliative care consultation CIR   Procedures:   Pack of red blood cell transfusion  Antimicrobials: None  Subjective: She is alert, still feeling weak and tired.  Objective: Vitals:   08/21/18 0929 08/21/18 2057 08/22/18 0329 08/22/18 0915  BP: (!) 151/65 (!) 158/71 (!) 151/65 (!) 150/65  Pulse: 80 91 91 80  Resp: '18 18 18 18  ' Temp: 97.8 F (36.6 C) 97.6 F (36.4 C) 97.8 F (36.6 C) 98.2 F (36.8 C)  TempSrc: Oral Oral Oral Oral  SpO2: 98% 98% 96% 98%  Weight:   59.8 kg   Height:        Intake/Output Summary (Last 24 hours) at 08/22/2018 1511 Last data filed at 08/22/2018 1225 Gross per 24 hour  Intake 640 ml  Output 1100 ml  Net -460 ml   Filed Weights   08/20/18 0500 08/20/18 2055 08/22/18 0329  Weight: 60.8 kg 60.8 kg  59.8 kg    Examination:  General exam: Appears calm and comfortable  Respiratory system: Clear to auscultation. Respiratory effort normal. Cardiovascular system: S1 & S2 heard, RRR. No JVD, murmurs, rubs, gallops or clicks. No pedal edema. Gastrointestinal system: Abdomen is nondistended, soft and nontender. No organomegaly or masses felt. Normal bowel sounds heard. Central  nervous system: Alert and oriented. No focal neurological deficits. Extremities: Symmetric 5 x 5 power. Skin: No rashes, lesions or ulcers Psychiatry: Judgement and insight appear normal. Mood & affect appropriate.     Data Reviewed: I have personally reviewed following labs and imaging studies  CBC: Recent Labs  Lab 08/20/18 0632 08/21/18 1138  WBC 6.9 8.1  NEUTROABS 4.7 5.8  HGB 7.6* 11.0*  HCT 23.9* 33.7*  MCV 89.8 87.3  PLT 205 311   Basic Metabolic Panel: Recent Labs  Lab 08/18/18 0527 08/19/18 0606 08/20/18 0632 08/21/18 1138 08/22/18 0328  NA 137 137 137 138 139  K 4.4 4.4 4.4 4.4 4.4  CL 103 102 103 102 104  CO2 20* 22 21* 20* 23  GLUCOSE 113* 116* 124* 115* 109*  BUN 73* 71* 69* 65* 64*  CREATININE 10.27* 9.65* 9.54* 9.10* 8.65*  CALCIUM 8.0* 8.5* 9.0 10.1 9.9  PHOS 4.5 3.8 3.8 3.5 3.5   GFR: Estimated Creatinine Clearance: 5 mL/min (A) (by C-G formula based on SCr of 8.65 mg/dL (H)). Liver Function Tests: Recent Labs  Lab 08/18/18 0527 08/19/18 0606 08/20/18 2162 08/21/18 1138 08/22/18 0328  ALBUMIN 2.3* 2.2* 2.4* 3.0* 2.7*   No results for input(s): LIPASE, AMYLASE in the last 168 hours. No results for input(s): AMMONIA in the last 168 hours. Coagulation Profile: No results for input(s): INR, PROTIME in the last 168 hours. Cardiac Enzymes: Recent Labs  Lab 08/20/18 1007  CKTOTAL 33*   BNP (last 3 results) No results for input(s): PROBNP in the last 8760 hours. HbA1C: Recent Labs    08/20/18 0626  HGBA1C 6.5*   CBG: Recent Labs  Lab 08/21/18 1129 08/21/18 1554 08/21/18 2057 08/22/18 0708 08/22/18 1118  GLUCAP 121* 141* 124* 119* 134*   Lipid Profile: No results for input(s): CHOL, HDL, LDLCALC, TRIG, CHOLHDL, LDLDIRECT in the last 72 hours. Thyroid Function Tests: Recent Labs    08/20/18 0632  TSH 3.464   Anemia Panel: No results for input(s): VITAMINB12, FOLATE, FERRITIN, TIBC, IRON, RETICCTPCT in the last 72  hours. Sepsis Labs: No results for input(s): PROCALCITON, LATICACIDVEN in the last 168 hours.  No results found for this or any previous visit (from the past 240 hour(s)).       Radiology Studies: Vas [place] Lower Extremity Venous (dvt)  Result Date: 08/21/2018  Lower Venous Study Indications: Edema.  Performing Technologist: Abram Sander RVS  Examination Guidelines: A complete evaluation includes B-mode imaging, spectral Doppler, color Doppler, and power Doppler as needed of all accessible portions of each vessel. Bilateral testing is considered an integral part of a complete examination. Limited examinations for reoccurring indications may be performed as noted.  Right Venous Findings: +---------+---------------+---------+-----------+----------+-------+            Compressibility Phasicity Spontaneity Properties Summary  +---------+---------------+---------+-----------+----------+-------+  CFV       Full            Yes       Yes                             +---------+---------------+---------+-----------+----------+-------+  SFJ  Full                                                      +---------+---------------+---------+-----------+----------+-------+  FV Prox   Full                                                      +---------+---------------+---------+-----------+----------+-------+  FV Mid    Full                                                      +---------+---------------+---------+-----------+----------+-------+  FV Distal Full                                                      +---------+---------------+---------+-----------+----------+-------+  PFV       Full                                                      +---------+---------------+---------+-----------+----------+-------+  POP       Full            Yes       Yes                             +---------+---------------+---------+-----------+----------+-------+  PTV       Full                                                       +---------+---------------+---------+-----------+----------+-------+  PERO      Full                                                      +---------+---------------+---------+-----------+----------+-------+  Left Venous Findings: +---------+---------------+---------+-----------+----------+--------------+            Compressibility Phasicity Spontaneity Properties Summary         +---------+---------------+---------+-----------+----------+--------------+  CFV       Full            Yes       Yes                                    +---------+---------------+---------+-----------+----------+--------------+  SFJ       Full                                                             +---------+---------------+---------+-----------+----------+--------------+  FV Prox   Full                                                             +---------+---------------+---------+-----------+----------+--------------+  FV Mid    Full                                                             +---------+---------------+---------+-----------+----------+--------------+  FV Distal Full                                                             +---------+---------------+---------+-----------+----------+--------------+  PFV       Full                                                             +---------+---------------+---------+-----------+----------+--------------+  POP       Full            Yes       Yes                                    +---------+---------------+---------+-----------+----------+--------------+  PTV       Full                                                             +---------+---------------+---------+-----------+----------+--------------+  PERO                                                       Not visualized  +---------+---------------+---------+-----------+----------+--------------+    Summary: Right: There is no evidence of deep vein thrombosis in the lower extremity. No cystic structure found in the  popliteal fossa. Left: There is no evidence of deep vein thrombosis in the lower extremity. No cystic structure found in the popliteal fossa.  *See table(s) above for measurements and observations. Electronically signed by Deitra Mayo MD on 08/21/2018 at 4:29:31 PM.    Final         Scheduled Meds:  sodium chloride   Intravenous Once   amLODipine  10 mg Oral Daily   aspirin EC  81 mg Oral Daily   calcitRIOL  0.25 mcg Oral Daily   calcium acetate  1,334 mg Oral TID WC   cholecalciferol  400 Units Oral Daily   cyanocobalamin  1,000 mcg Intramuscular  Once   ferrous sulfate  325 mg Oral TID WC   heparin  5,000 Units Subcutaneous Q8H   insulin aspart  0-5 Units Subcutaneous QHS   insulin aspart  0-9 Units Subcutaneous TID WC   nicotine  21 mg Transdermal Daily   pantoprazole  40 mg Oral Daily   pravastatin  10 mg Oral q1800   sodium bicarbonate  650 mg Oral BID   sodium chloride flush  3 mL Intravenous Q12H   vitamin B-12  1,000 mcg Oral Daily   Continuous Infusions:  sodium chloride Stopped (08/09/18 2346)     LOS: 13 days    Time spent: 35 minutes.     Elmarie Shiley, MD [facility] Pager [phone]  If 7PM-7AM, please contact night-coverage [email or web address] Password [facility] 08/22/2018, 3:11 PM

## 2018-08-22 NOTE — Progress Notes (Signed)
[facility] ROUNDING NOTE   Subjective:   Brief history 73 year old lady history of hypertension diabetes stage III chronic kidney disease baseline serum creatinine 1.5 to 2.0 mg/dL presented with nausea vomiting and diarrhea.  BUN increased to 99 creatinine increased to 12.67 with a CO2 of 11.  Working diagnosis is ischemic acute tubular necrosis with concomitant use of lisinopril/hydrochlorothiazide and metformin.  Urinalysis was bland with no red blood cells no white blood cells and just 100 mg/dL protein.  Urine protein creatinine ratio 1.6 with a kappa lambda ratio of 2.38.  No M spike noted.  Ultrasound of the kidneys revealed increased echogenicity with no obstruction.  Creatinine appears to be slowly improving with no indication for dialysis.  Palliative care are involved.  Sitting out of bed eating breakfast.  She appears to be oriented to time place person  Blood pressure 150/65 pulse 80 temperature 98.2 O2 sats 98% room air  Urine output 875 cc 08/21/2018.  Weight 59.8 kg  Sodium 139 potassium 4.4 chloride 104 CO2 23 BUN 64 creatinine 8.65 glucose 109 calcium 9.9 phosphorus 3.5 albumin 2.7 WBC 8.1 hemoglobin 11.0 platelets 261  Amlodipine 10 mg daily pravastatin at 10 mg daily Calcitrol 0.25 mcg daily, calcium acetate 1334 mg 3 times daily, sodium bicarbonate 650 mg twice daily, iron sulfate 325 mg 3 times daily, nicotine patch, Protonix 40 mg daily, aspirin 81 mg daily    Objective:  Vital signs in last 24 hours:  Temp:  [97.6 F (36.4 C)-98.2 F (36.8 C)] 98.2 F (36.8 C) (03/11 0915) Pulse Rate:  [80-91] 80 (03/11 0915) Resp:  [18] 18 (03/11 0915) BP: (150-158)/(65-71) 150/65 (03/11 0915) SpO2:  [96 %-98 %] 98 % (03/11 0915) Weight:  [59.8 kg] 59.8 kg (03/11 0329)  Weight change: -1 kg Filed Weights   08/20/18 0500 08/20/18 2055 08/22/18 0329  Weight: 60.8 kg 60.8 kg 59.8 kg    Intake/Output: I/O last 3 completed shifts: In: 1060 [P.O.:1060] Out: 1525  [Urine:1525]   Intake/Output this shift:  Total I/O In: 120 [P.O.:120] Out: 75 [Urine:75]   Awake alert CVS- RRR no murmurs rubs gallops RS- CTA no wheezes rales ABD- BS present soft non-distended EXT-trace lower extremity edema   Basic Metabolic Panel: Recent Labs  Lab 08/18/18 0527 08/19/18 0606 08/20/18 0632 08/21/18 1138 08/22/18 0328  NA 137 137 137 138 139  K 4.4 4.4 4.4 4.4 4.4  CL 103 102 103 102 104  CO2 20* 22 21* 20* 23  GLUCOSE 113* 116* 124* 115* 109*  BUN 73* 71* 69* 65* 64*  CREATININE 10.27* 9.65* 9.54* 9.10* 8.65*  CALCIUM 8.0* 8.5* 9.0 10.1 9.9  PHOS 4.5 3.8 3.8 3.5 3.5    Liver Function Tests: Recent Labs  Lab 08/18/18 0527 08/19/18 0606 08/20/18 0632 08/21/18 1138 08/22/18 0328  ALBUMIN 2.3* 2.2* 2.4* 3.0* 2.7*   No results for input(s): LIPASE, AMYLASE in the last 168 hours. No results for input(s): AMMONIA in the last 168 hours.  CBC: Recent Labs  Lab 08/20/18 0632 08/21/18 1138  WBC 6.9 8.1  NEUTROABS 4.7 5.8  HGB 7.6* 11.0*  HCT 23.9* 33.7*  MCV 89.8 87.3  PLT 205 261    Cardiac Enzymes: Recent Labs  Lab 08/20/18 1007  CKTOTAL 33*    BNP: Invalid input(s): POCBNP  CBG: Recent Labs  Lab 08/21/18 0648 08/21/18 1129 08/21/18 1554 08/21/18 2057 08/22/18 0708  GLUCAP 121* 121* 141* 124* 119*    Microbiology: Results for orders placed or performed during  the hospital encounter of 01/15/10  Surgical pcr screen     Status: None   Collection Time: 01/14/10  1:09 PM  Result Value Ref Range Status   MRSA, PCR NEGATIVE NEGATIVE Final   Staphylococcus aureus  NEGATIVE Final    NEGATIVE        The Xpert SA Assay (FDA approved for NASAL specimens only), is one component of a comprehensive surveillance program.  It is not intended to diagnose infection nor to guide or monitor treatment.    Coagulation Studies: No results for input(s): LABPROT, INR in the last 72 hours.  Urinalysis: No results for input(s):  COLORURINE, LABSPEC, PHURINE, GLUCOSEU, HGBUR, BILIRUBINUR, KETONESUR, PROTEINUR, UROBILINOGEN, NITRITE, LEUKOCYTESUR in the last 72 hours.  Invalid input(s): APPERANCEUR    Imaging: Vas [place] Lower Extremity Venous (dvt)  Result Date: 08/21/2018  Lower Venous Study Indications: Edema.  Performing Technologist: Abram Sander RVS  Examination Guidelines: A complete evaluation includes B-mode imaging, spectral Doppler, color Doppler, and power Doppler as needed of all accessible portions of each vessel. Bilateral testing is considered an integral part of a complete examination. Limited examinations for reoccurring indications may be performed as noted.  Right Venous Findings: +---------+---------------+---------+-----------+----------+-------+          CompressibilityPhasicitySpontaneityPropertiesSummary +---------+---------------+---------+-----------+----------+-------+ CFV      Full           Yes      Yes                          +---------+---------------+---------+-----------+----------+-------+ SFJ      Full                                                 +---------+---------------+---------+-----------+----------+-------+ FV Prox  Full                                                 +---------+---------------+---------+-----------+----------+-------+ FV Mid   Full                                                 +---------+---------------+---------+-----------+----------+-------+ FV DistalFull                                                 +---------+---------------+---------+-----------+----------+-------+ PFV      Full                                                 +---------+---------------+---------+-----------+----------+-------+ POP      Full           Yes      Yes                          +---------+---------------+---------+-----------+----------+-------+ PTV      Full                                                  +---------+---------------+---------+-----------+----------+-------+  PERO     Full                                                 +---------+---------------+---------+-----------+----------+-------+  Left Venous Findings: +---------+---------------+---------+-----------+----------+--------------+          CompressibilityPhasicitySpontaneityPropertiesSummary        +---------+---------------+---------+-----------+----------+--------------+ CFV      Full           Yes      Yes                                 +---------+---------------+---------+-----------+----------+--------------+ SFJ      Full                                                        +---------+---------------+---------+-----------+----------+--------------+ FV Prox  Full                                                        +---------+---------------+---------+-----------+----------+--------------+ FV Mid   Full                                                        +---------+---------------+---------+-----------+----------+--------------+ FV DistalFull                                                        +---------+---------------+---------+-----------+----------+--------------+ PFV      Full                                                        +---------+---------------+---------+-----------+----------+--------------+ POP      Full           Yes      Yes                                 +---------+---------------+---------+-----------+----------+--------------+ PTV      Full                                                        +---------+---------------+---------+-----------+----------+--------------+ PERO  Not visualized +---------+---------------+---------+-----------+----------+--------------+    Summary: Right: There is no evidence of deep vein thrombosis in the lower extremity. No cystic structure found in the  popliteal fossa. Left: There is no evidence of deep vein thrombosis in the lower extremity. No cystic structure found in the popliteal fossa.  *See table(s) above for measurements and observations. Electronically signed by Deitra Mayo MD on 08/21/2018 at 4:29:31 PM.    Final      Medications:   . sodium chloride Stopped (08/09/18 2346)   . sodium chloride   Intravenous Once  . amLODipine  10 mg Oral Daily  . aspirin EC  81 mg Oral Daily  . calcitRIOL  0.25 mcg Oral Daily  . calcium acetate  1,334 mg Oral TID WC  . ferrous sulfate  325 mg Oral TID WC  . heparin  5,000 Units Subcutaneous Q8H  . insulin aspart  0-5 Units Subcutaneous QHS  . insulin aspart  0-9 Units Subcutaneous TID WC  . nicotine  21 mg Transdermal Daily  . pantoprazole  40 mg Oral Daily  . pravastatin  10 mg Oral q1800  . sodium bicarbonate  650 mg Oral BID  . sodium chloride flush  3 mL Intravenous Q12H  . vitamin B-12  1,000 mcg Oral Daily   sodium chloride, acetaminophen **OR** acetaminophen, haloperidol lactate, ondansetron **OR** ondansetron (ZOFRAN) IV  Assessment/ Plan:   Acute on chronic kidney disease probably secondary to ischemic ATN in the setting of dehydration and lisinopril/hydrochlorothiazide and metformin.  Creatinine appears to be slowly improving.  There is no urgent indication for dialysis at this particular time we will continue to follow urine output and creatinine.  Creatinine has improved.  It appears that this will continue to improve.  Baseline creatinine 1.5-2.0.  She may be discharged with follow-up of labs every 3 to 4 days.  I am not sure of the logistics of obtaining labs on this lady.  Certainly depends on where she is going to be discharged to.  She appears weak debilitated I am not sure that she is going to be able to function independently at home.  It appears that she will need a short-term nursing bed.  Labs can be drawn at the nursing home.  These can be sent to [facility] for my attention.  They can also be followed by her primary care physician at the nursing home.  Any questions, call [phone] my cell phone number otherwise sign off on patient today.  Anemia iron saturations 29% Aranesp administered 08/13/2018 hemoglobin now appears to be within goal.  This can be followed by primary physician  Secondary hyperparathyroidism PTH 204 we will continue PhosLo binders 2 tablets with meals and calcitriol daily continue to follow electrolytes  Metabolic acidosis appears to be improved.  Continue to monitor  Disposition patient appears to be weak debilitated.  Palliative medicine are working with patient and family.  As the renal function appears to be improving slowly we shall continue to follow.  Outpatient appointment [facility] Dr. Justin Mend within the next 2 to 4 weeks.  It is fine for her to be in the PA clinic at the same office   LOS: [place] @TODAY @10 :06 AM

## 2018-08-23 LAB — RENAL FUNCTION PANEL
Albumin: 2.6 g/dL — ABNORMAL LOW (ref 3.5–5.0)
Anion gap: 14 (ref 5–15)
BUN: 63 mg/dL — ABNORMAL HIGH (ref 8–23)
CO2: 20 mmol/L — ABNORMAL LOW (ref 22–32)
Calcium: 9.6 mg/dL (ref 8.9–10.3)
Chloride: 104 mmol/L (ref 98–111)
Creatinine, Ser: 8.25 mg/dL — ABNORMAL HIGH (ref 0.44–1.00)
GFR calc Af Amer: 5 mL/min — ABNORMAL LOW (ref >60)
GFR calc non Af Amer: 4 mL/min — ABNORMAL LOW (ref >60)
Glucose, Bld: 135 mg/dL — ABNORMAL HIGH (ref 70–99)
Phosphorus: 3.8 mg/dL (ref 2.5–4.6)
Potassium: 4.3 mmol/L (ref 3.5–5.1)
Sodium: 138 mmol/L (ref 135–145)

## 2018-08-23 LAB — CBC
HCT: 29.4 % — ABNORMAL LOW (ref 36.0–46.0)
Hemoglobin: 9.6 g/dL — ABNORMAL LOW (ref 12.0–15.0)
MCH: 29.4 pg (ref 26.0–34.0)
MCHC: 32.7 g/dL (ref 30.0–36.0)
MCV: 90.2 fL (ref 80.0–100.0)
Platelets: 235 10*3/uL (ref 150–400)
RBC: 3.26 MIL/uL — ABNORMAL LOW (ref 3.87–5.11)
RDW: 15.1 % (ref 11.5–15.5)
WBC: 6.4 10*3/uL (ref 4.0–10.5)
nRBC: 0 % (ref 0.0–0.2)

## 2018-08-23 LAB — GLUCOSE, CAPILLARY
Glucose-Capillary: 154 mg/dL — ABNORMAL HIGH (ref 70–99)
Glucose-Capillary: 118 mg/dL — ABNORMAL HIGH (ref 70–99)
Glucose-Capillary: 109 mg/dL — ABNORMAL HIGH (ref 70–99)
Glucose-Capillary: 150 mg/dL — ABNORMAL HIGH (ref 70–99)

## 2018-08-23 NOTE — Clinical Social Work Note (Signed)
CSW intern Sandra Sheppard talked with Sandra Sheppard (2:26 pm), admissions director (filling in for Sandra Sheppard at [facility]) regarding patient. Sandra Sheppard was informed that patient's insurance denied SNF placement indicating that patient was too independent for ST rehab and provided the phone number for MD to call for a peer-to-peer [phone]). Dr. Tyrell Sheppard contacted (2:48 pm) regarding insurance denial and provided with phone number.  4:15 pm - Visited with patient and updated her regarding insurance denial and MD being advised of denial and option to do a peer-to-peer. Sandra Sheppard was informed that she will be kept updated regarding the outcome of MD's contact with the insurance company.  Sandra Sheppard, MSW, LCSW Licensed Clinical Social Worker [place] [phone]

## 2018-08-23 NOTE — Progress Notes (Signed)
PROGRESS NOTE    Sandra Sheppard  MRN:[id number] DOB: 05/28/1946 DOA: 08/08/2018 PCP: Avva, Ravisankar, MD   Brief Narrative: 73-year-old past medical history significant for hypertension, diabetes, stage III chronic kidney disease with a creatinine baseline 1.5-2.0 who presented with nausea vomiting and diarrhea.  She was found to be in acute acute on chronic renal failure with a creatinine at 12.  Nephrologist was consulted, working diagnosis is ischemic acute tubular necrosis with concomitant use of lisinopril/hydrochlorothiazide and metformin.  M spike was negative.  Renal ultrasound was negative for hydronephrosis.  Patient was treated with IV fluids.  Subsequently creatinine function has been improving slowly.    Assessment & Plan:   Principal Problem:   AKI (acute kidney injury) (HCC) Active Problems:   HYPERTENSION, MILD   CKD (chronic kidney disease) stage 3, GFR 30-59 ml/min (HCC)   Hyperkalemia   Metabolic acidosis, increased anion gap   Normocytic anemia   Diabetes mellitus type II, non insulin dependent (HCC)   Hypocalcemia   FTT (failure to thrive) in adult   Acute renal failure with acute tubular necrosis superimposed on stage 3 chronic kidney disease (HCC)   Palliative care encounter   1-acute on chronic kidney disease a stage III in the setting of probably ischemic ATN in the setting of dehydration and lisinopril/hydrochlorothiazide and metformin use. She was treated with IV fluids. Held creatinine has decreased today to 8. Per nephrology okay to discharge patient with close follow-up.  Patient will need to be met in 3 to 4 days. Will need to follow-up with nephrology. Creatinine is stable at 8.  Urine output documented from yesterday likely not accurate.  Per nursing report today patient has been urinary, also incontinence episode in the bed.  2-generalized edema, bilateral arm edema. Venous Doppler lower extremity negative for DVT. Improving.  3-Hyperkalemia on  admission.  Related to AKI and ace inhibitor.  Resolved. 4-normocytic anemia: B12 low at 117.  She has received intramuscular supplements.  Continue with oral supplements.  Monitor hemoglobin.  Hypertension: Hydrochlorothiazide and lisinopril have been discontinued due to AKI. Started on Norvasc.  Diabetes: Continue with a sliding scale insulin.  Hyperlipidemia: Continue with pravastatin.  Acute confusional state: Seroquel at bedtime as needed.  Haldol IV as needed.  Last dose of 12/29.  Improved.  Folic acidosis in the setting of renal failure.  Improving.   Estimated body mass index is 22.37 kg/m as calculated from the following:   Height as of this encounter: 5' 4" (1.626 m).   Weight as of this encounter: 59.1 kg.   DVT prophylaxis: Heparin Code Status: Partial code Family Communication: Care discussed with patient Disposition Plan: She will require a skilled nursing facility.  Awaiting bed   Consultants:  = Nephrology Palliative care consultation CIR   Procedures:   Pack of red blood cell transfusion  Antimicrobials: None  Subjective: No new complaints, denies abdominal pain. Objective: Vitals:   08/22/18 1618 08/22/18 2050 08/23/18 0428 08/23/18 0904  BP: 131/89 (!) 156/70 (!) 159/70 (!) 143/56  Pulse: 78 83 90 65  Resp: 18 15 16 18  Temp: 98.2 F (36.8 C) 98.2 F (36.8 C) 98.5 F (36.9 C) 98 F (36.7 C)  TempSrc: Oral   Oral  SpO2: 98% 98% 97% 99%  Weight:  59.1 kg    Height:        Intake/Output Summary (Last 24 hours) at 08/23/2018 1358 Last data filed at 08/23/2018 1220 Gross per 24 hour  Intake 600 ml    Output 300 ml  Net 300 ml   Filed Weights   08/20/18 2055 08/22/18 0329 08/22/18 2050  Weight: 60.8 kg 59.8 kg 59.1 kg    Examination:  General exam: Not acute distress Respiratory system: Clear to auscultation Cardiovascular system: 1, S2 regular rhythm and rate Gastrointestinal system: Sounds present, soft nontender nondistended  Central nervous system: Nonfocal Extremities: Metric power  Data Reviewed: I have personally reviewed following labs and imaging studies  CBC: Recent Labs  Lab 08/20/18 0632 08/21/18 1138  WBC 6.9 8.1  NEUTROABS 4.7 5.8  HGB 7.6* 11.0*  HCT 23.9* 33.7*  MCV 89.8 87.3  PLT 205 261   Basic Metabolic Panel: Recent Labs  Lab 08/19/18 0606 08/20/18 0632 08/21/18 1138 08/22/18 0328 08/23/18 0505  NA 137 137 138 139 138  K 4.4 4.4 4.4 4.4 4.3  CL 102 103 102 104 104  CO2 22 21* 20* 23 20*  GLUCOSE 116* 124* 115* 109* 135*  BUN 71* 69* 65* 64* 63*  CREATININE 9.65* 9.54* 9.10* 8.65* 8.25*  CALCIUM 8.5* 9.0 10.1 9.9 9.6  PHOS 3.8 3.8 3.5 3.5 3.8   GFR: Estimated Creatinine Clearance: 5.2 mL/min (A) (by C-G formula based on SCr of 8.25 mg/dL (H)). Liver Function Tests: Recent Labs  Lab 08/19/18 0606 08/20/18 0632 08/21/18 1138 08/22/18 0328 08/23/18 0505  ALBUMIN 2.2* 2.4* 3.0* 2.7* 2.6*   No results for input(s): LIPASE, AMYLASE in the last 168 hours. No results for input(s): AMMONIA in the last 168 hours. Coagulation Profile: No results for input(s): INR, PROTIME in the last 168 hours. Cardiac Enzymes: Recent Labs  Lab 08/20/18 1007  CKTOTAL 33*   BNP (last 3 results) No results for input(s): PROBNP in the last 8760 hours. HbA1C: No results for input(s): HGBA1C in the last 72 hours. CBG: Recent Labs  Lab 08/22/18 1118 08/22/18 1617 08/22/18 2050 08/23/18 1012 08/23/18 1134  GLUCAP 134* 120* 164* 154* 118*   Lipid Profile: No results for input(s): CHOL, HDL, LDLCALC, TRIG, CHOLHDL, LDLDIRECT in the last 72 hours. Thyroid Function Tests: No results for input(s): TSH, T4TOTAL, FREET4, T3FREE, THYROIDAB in the last 72 hours. Anemia Panel: No results for input(s): VITAMINB12, FOLATE, FERRITIN, TIBC, IRON, RETICCTPCT in the last 72 hours. Sepsis Labs: No results for input(s): PROCALCITON, LATICACIDVEN in the last 168 hours.  No results found for this  or any previous visit (from the past 240 hour(s)).       Radiology Studies: No results found.      Scheduled Meds: . sodium chloride   Intravenous Once  . amLODipine  10 mg Oral Daily  . aspirin EC  81 mg Oral Daily  . calcitRIOL  0.25 mcg Oral Daily  . calcium acetate  1,334 mg Oral TID WC  . cholecalciferol  400 Units Oral Daily  . ferrous sulfate  325 mg Oral TID WC  . heparin  5,000 Units Subcutaneous Q8H  . insulin aspart  0-5 Units Subcutaneous QHS  . insulin aspart  0-9 Units Subcutaneous TID WC  . nicotine  21 mg Transdermal Daily  . pantoprazole  40 mg Oral Daily  . pravastatin  10 mg Oral q1800  . sodium bicarbonate  650 mg Oral BID  . sodium chloride flush  3 mL Intravenous Q12H  . vitamin B-12  1,000 mcg Oral Daily   Continuous Infusions: . sodium chloride Stopped (08/09/18 2346)     LOS: 14 days    Time spent: 35 minutes.     [place]   A Regalado, MD [facility] Pager [phone]  If 7PM-7AM, please contact night-coverage [email or web address] Password [email or web address] 08/23/2018, 1:58 PM  

## 2018-08-24 LAB — RENAL FUNCTION PANEL
Albumin: 2.6 g/dL — ABNORMAL LOW (ref 3.5–5.0)
Anion gap: 18 — ABNORMAL HIGH (ref 5–15)
BUN: 60 mg/dL — ABNORMAL HIGH (ref 8–23)
CO2: 21 mmol/L — ABNORMAL LOW (ref 22–32)
Calcium: 10.7 mg/dL — ABNORMAL HIGH (ref 8.9–10.3)
Chloride: 100 mmol/L (ref 98–111)
Creatinine, Ser: 7.82 mg/dL — ABNORMAL HIGH (ref 0.44–1.00)
GFR calc Af Amer: 5 mL/min — ABNORMAL LOW (ref >60)
GFR calc non Af Amer: 5 mL/min — ABNORMAL LOW (ref >60)
Glucose, Bld: 123 mg/dL — ABNORMAL HIGH (ref 70–99)
Phosphorus: 3.8 mg/dL (ref 2.5–4.6)
Potassium: 4.1 mmol/L (ref 3.5–5.1)
Sodium: 139 mmol/L (ref 135–145)

## 2018-08-24 LAB — GLUCOSE, CAPILLARY
Glucose-Capillary: 125 mg/dL — ABNORMAL HIGH (ref 70–99)
Glucose-Capillary: 103 mg/dL — ABNORMAL HIGH (ref 70–99)
Glucose-Capillary: 199 mg/dL — ABNORMAL HIGH (ref 70–99)
Glucose-Capillary: 119 mg/dL — ABNORMAL HIGH (ref 70–99)

## 2018-08-24 MED ORDER — VITAMIN D 25 MCG (1000 UNIT) PO TABS
1000.0000 [IU] | ORAL_TABLET | Freq: Every day | ORAL | Status: DC
  Administered 2018-08-24: 1000 [IU] via ORAL
  Filled 2018-08-24 (×2): qty 1

## 2018-08-24 MED ORDER — SODIUM BICARBONATE 650 MG PO TABS
1300.0000 mg | ORAL_TABLET | Freq: Two times a day (BID) | ORAL | Status: DC
  Administered 2018-08-24 – 2018-08-31 (×14): 1300 mg via ORAL
  Filled 2018-08-24 (×15): qty 2

## 2018-08-24 NOTE — Progress Notes (Addendum)
PROGRESS NOTE    Sandra Sheppard  VUD:[id number] DOB: 06-01-1946 DOA: 08/08/2018 PCP: Prince Solian, MD   Brief Narrative: 73 year old past medical history significant for hypertension, diabetes, stage III chronic kidney disease with a creatinine baseline 1.5-2.0 who presented with nausea vomiting and diarrhea.  She was found to be in acute acute on chronic renal failure with a creatinine at 12.  Nephrologist was consulted, working diagnosis is ischemic acute tubular necrosis with concomitant use of lisinopril/hydrochlorothiazide and metformin.  M spike was negative.  Renal ultrasound was negative for hydronephrosis.  Patient was treated with IV fluids.  Subsequently creatinine function has been improving slowly.    Assessment & Plan:   Principal Problem:   AKI (acute kidney injury) ([place]) Active Problems:   HYPERTENSION, MILD   CKD (chronic kidney disease) stage 3, GFR 30-59 ml/min (HCC)   Hyperkalemia   Metabolic acidosis, increased anion gap   Normocytic anemia   Diabetes mellitus type II, non insulin dependent (HCC)   Hypocalcemia   FTT (failure to thrive) in adult   Acute renal failure with acute tubular necrosis superimposed on stage 3 chronic kidney disease ([place])   Palliative care encounter   1-Acute on chronic kidney disease a stage III in the setting of probably ischemic ATN in the setting of dehydration and lisinopril/hydrochlorothiazide and metformin use. She was treated with IV fluids. Held creatinine has decreased today to 8. Per nephrology okay to discharge patient with close follow-up.  Patient will need to be met in 3 to 4 days. Will need to follow-up with nephrology. Creatinine trending down.  Urine output is stable  2-Generalized edema, bilateral arm edema. Venous Doppler lower extremity negative for DVT. Improving.  3-Hyperkalemia on admission.  Related to AKI and ace inhibitor.  Resolved. 4-normocytic anemia: B12 low at 117.  She has received intramuscular  supplements.  Continue with oral supplements.  Monitor hemoglobin.  Hypertension: Hydrochlorothiazide and lisinopril have been discontinued due to AKI. Started on Norvasc.  Diabetes: Continue with a sliding scale insulin.  Hyperlipidemia: Continue with pravastatin.  Acute confusional state: Seroquel at bedtime as needed.  Haldol IV as needed.  Last dose of 12/29.  She had an episode of confusion this morning along with some hallucination.  Lactic acidosis in the setting of renal failure.  Increase bicarb topic today   Estimated body mass index is 22.36 kg/m as calculated from the following:   Height as of this encounter: '5\' 4"'  (1.626 m).   Weight as of this encounter: 59.1 kg.   DVT prophylaxis: Heparin Code Status: Partial code Family Communication: Care discussed with patient Disposition Plan: She will require a skilled nursing facility.  Patient would benefit from admission to a skilled nursing facility to improve with mobility, to monitor due to confusion, to continue to monitor urine output and renal function.  I have call [employer] to do peer to peer review twice.  I call on 3/12 on 3/13 awaiting the response.   Consultants:  = Nephrology Palliative care consultation CIR   Procedures:   Pack of red blood cell transfusion  Antimicrobials: None  Subjective: Per nurse, who was with the patient at that time, reported seeing somebody in her room smoking.  Patient now alert and oriented  Objective: Vitals:   08/23/18 0904 08/23/18 2054 08/24/18 0512 08/24/18 1047  BP: (!) 143/56 (!) 157/72 (!) 146/48 (!) 142/59  Pulse: 65 81 74 66  Resp: '18 20 19 18  ' Temp: 98 F (36.7 C) 97.6 F (36.4  C) 98.4 F (36.9 C) 97.9 F (36.6 C)  TempSrc: Oral Oral Oral Oral  SpO2: 99% 94% 94% 95%  Weight:  59.1 kg    Height:        Intake/Output Summary (Last 24 hours) at 08/24/2018 1220 Last data filed at 08/24/2018 0600 Gross per 24 hour  Intake 223 ml  Output 1200 ml  Net -977  ml   Filed Weights   08/22/18 0329 08/22/18 2050 08/23/18 2054  Weight: 59.8 kg 59.1 kg 59.1 kg    Examination:  General exam: No acute distress Respiratory system: CTA Cardiovascular system: S 1, S 2 RRR Gastrointestinal system: BS present, soft, nt Central nervous system: non focal.  Extremities: symmetric power.   Data Reviewed: I have personally reviewed following labs and imaging studies  CBC: Recent Labs  Lab 08/20/18 0632 08/21/18 1138 08/23/18 1509  WBC 6.9 8.1 6.4  NEUTROABS 4.7 5.8  --   HGB 7.6* 11.0* 9.6*  HCT 23.9* 33.7* 29.4*  MCV 89.8 87.3 90.2  PLT 205 261 245   Basic Metabolic Panel: Recent Labs  Lab 08/20/18 0632 08/21/18 1138 08/22/18 0328 08/23/18 0505 08/24/18 0334  NA 137 138 139 138 139  K 4.4 4.4 4.4 4.3 4.1  CL 103 102 104 104 100  CO2 21* 20* 23 20* 21*  GLUCOSE 124* 115* 109* 135* 123*  BUN 69* 65* 64* 63* 60*  CREATININE 9.54* 9.10* 8.65* 8.25* 7.82*  CALCIUM 9.0 10.1 9.9 9.6 10.7*  PHOS 3.8 3.5 3.5 3.8 3.8   GFR: Estimated Creatinine Clearance: 5.5 mL/min (A) (by C-G formula based on SCr of 7.82 mg/dL (H)). Liver Function Tests: Recent Labs  Lab 08/20/18 8099 08/21/18 1138 08/22/18 0328 08/23/18 0505 08/24/18 0334  ALBUMIN 2.4* 3.0* 2.7* 2.6* 2.6*   No results for input(s): LIPASE, AMYLASE in the last 168 hours. No results for input(s): AMMONIA in the last 168 hours. Coagulation Profile: No results for input(s): INR, PROTIME in the last 168 hours. Cardiac Enzymes: Recent Labs  Lab 08/20/18 1007  CKTOTAL 33*   BNP (last 3 results) No results for input(s): PROBNP in the last 8760 hours. HbA1C: No results for input(s): HGBA1C in the last 72 hours. CBG: Recent Labs  Lab 08/23/18 1134 08/23/18 1612 08/23/18 2057 08/24/18 0729 08/24/18 1127  GLUCAP 118* 109* 150* 125* 103*   Lipid Profile: No results for input(s): CHOL, HDL, LDLCALC, TRIG, CHOLHDL, LDLDIRECT in the last 72 hours. Thyroid Function Tests: No  results for input(s): TSH, T4TOTAL, FREET4, T3FREE, THYROIDAB in the last 72 hours. Anemia Panel: No results for input(s): VITAMINB12, FOLATE, FERRITIN, TIBC, IRON, RETICCTPCT in the last 72 hours. Sepsis Labs: No results for input(s): PROCALCITON, LATICACIDVEN in the last 168 hours.  No results found for this or any previous visit (from the past 240 hour(s)).       Radiology Studies: No results found.      Scheduled Meds: . sodium chloride   Intravenous Once  . amLODipine  10 mg Oral Daily  . aspirin EC  81 mg Oral Daily  . calcitRIOL  0.25 mcg Oral Daily  . calcium acetate  1,334 mg Oral TID WC  . ferrous sulfate  325 mg Oral TID WC  . heparin  5,000 Units Subcutaneous Q8H  . insulin aspart  0-5 Units Subcutaneous QHS  . insulin aspart  0-9 Units Subcutaneous TID WC  . nicotine  21 mg Transdermal Daily  . pantoprazole  40 mg Oral Daily  . pravastatin  10 mg Oral q1800  . sodium bicarbonate  1,300 mg Oral BID  . sodium chloride flush  3 mL Intravenous Q12H  . vitamin B-12  1,000 mcg Oral Daily   Continuous Infusions: . sodium chloride Stopped (08/09/18 2346)     LOS: 15 days    Time spent: 35 minutes.     Elmarie Shiley, MD [facility] Pager [phone]  If 7PM-7AM, please contact night-coverage [email or web address] Password [facility] 08/24/2018, 12:19 PM

## 2018-08-24 NOTE — Progress Notes (Signed)
PT Cancellation Note  Patient Details Name: Sandra Sheppard MRN: [id number] DOB: 1945-11-10   Cancelled Treatment:    Reason Eval/Treat Not Completed: Lunch trays arrived off schedule and pt eating lunch during planned therapy time. Will check back as schedule allows to continue with PT POC.    [name] 08/24/2018, 2:46 PM   Rolinda Roan, PT, DPT Acute Rehabilitation Services Pager: [phone] Office: [phone]

## 2018-08-24 NOTE — Clinical Social Work Note (Signed)
CSW learned on 3/12 that insurance denied patient and information provided for MD to do a peer-to-peer. Dr. Tyrell Antonio has called insurance company twice and left messages. Patient advised of insurance denial and peer-to-peer on Thursday. CSW will continue to follow and assist as needed with discharge disposition.  Wessie Shanks Givens, MSW, LCSW Licensed Clinical Social Worker [place] [phone]

## 2018-08-24 NOTE — Plan of Care (Signed)
  Problem: Clinical Measurements: Goal: Complications related to the disease process or treatment will be avoided or minimized Outcome: Progressing   Problem: Urinary Elimination: Goal: Progression of disease will be identified and treated Outcome: Progressing

## 2018-08-25 LAB — RENAL FUNCTION PANEL
Albumin: 2.6 g/dL — ABNORMAL LOW (ref 3.5–5.0)
Anion gap: 13 (ref 5–15)
BUN: 60 mg/dL — ABNORMAL HIGH (ref 8–23)
CO2: 21 mmol/L — ABNORMAL LOW (ref 22–32)
Calcium: 9.5 mg/dL (ref 8.9–10.3)
Chloride: 103 mmol/L (ref 98–111)
Creatinine, Ser: 7.79 mg/dL — ABNORMAL HIGH (ref 0.44–1.00)
GFR calc Af Amer: 5 mL/min — ABNORMAL LOW (ref >60)
GFR calc non Af Amer: 5 mL/min — ABNORMAL LOW (ref >60)
Glucose, Bld: 138 mg/dL — ABNORMAL HIGH (ref 70–99)
Phosphorus: 3.5 mg/dL (ref 2.5–4.6)
Potassium: 4 mmol/L (ref 3.5–5.1)
Sodium: 137 mmol/L (ref 135–145)

## 2018-08-25 LAB — GLUCOSE, CAPILLARY
Glucose-Capillary: 132 mg/dL — ABNORMAL HIGH (ref 70–99)
Glucose-Capillary: 131 mg/dL — ABNORMAL HIGH (ref 70–99)
Glucose-Capillary: 120 mg/dL — ABNORMAL HIGH (ref 70–99)
Glucose-Capillary: 288 mg/dL — ABNORMAL HIGH (ref 70–99)
Glucose-Capillary: 224 mg/dL — ABNORMAL HIGH (ref 70–99)

## 2018-08-25 MED ORDER — NEPRO/CARBSTEADY PO LIQD
237.0000 mL | Freq: Two times a day (BID) | ORAL | Status: DC
  Administered 2018-08-25 – 2018-08-31 (×13): 237 mL via ORAL
  Filled 2018-08-25 (×13): qty 237

## 2018-08-25 MED ORDER — BOOST / RESOURCE BREEZE PO LIQD CUSTOM: 1.0000 | Freq: Two times a day (BID) | ORAL | Status: DC

## 2018-08-25 NOTE — Progress Notes (Signed)
Physical Therapy Treatment Patient Details Name: Sandra Sheppard MRN: [id number] DOB: 12-26-1945 Today's Date: 08/25/2018    History of Present Illness Pt is a 73 y/o F with significant PMH of hypertension, type 2 diabetes mellitus, and chronic kidney disease stage III now presenting for evaluation of abnormal outpatient blood work and found to have an acute kidney injury.    PT Comments    Pt participated in gait and stair training. Pt limited d/t fatigue and continued cognitive deficits that prevent the progression of more intensive therapy. Pt adamant that she will return home on Tuesday no matter what, stating that her neighbor would check in on her but after questioned she confirmed that no one will be staying with her around the clock. D/t her deficits both cognitively and physically it is highly recommended that SNF remain as d/c plan at this time. Plan to progress with more gait and stair training to aid with potential return to residence.   Follow Up Recommendations  SNF;Supervision/Assistance - 24 hour     Equipment Recommendations  Rolling walker with 5" wheels    Recommendations for Other Services OT consult     Precautions / Restrictions Precautions Precautions: Fall Restrictions Weight Bearing Restrictions: No    Mobility  Bed Mobility               General bed mobility comments: pt received in chair  Transfers Overall transfer level: Needs assistance Equipment used: Rolling walker (2 wheeled) Transfers: Sit to/from [facility] Sit to Stand: Min assist         General transfer comment: Heavy min assist required for power-up to full stand. Pt posterior lean upon stand required vc/tc's for hand placement on RW.  Ambulation/Gait Ambulation/Gait assistance: Min assist Gait Distance (Feet): 125 Feet Assistive device: Rolling walker (2 wheeled) Gait Pattern/deviations: Step-through pattern;Decreased stride length;Drifts right/left Gait  velocity: decreased   General Gait Details: VC's for improved posture and closer walker proximity. Pt consistently drifts to the left with RW and had difficulty negotiating obstacles on the left side. Pt required assistance moving the walker as she ran into obstacles on 2 occasions.    Stairs   Stairs assistance: Min assist;Mod assist Stair Management: Two rails;Forwards;Alternating pattern Number of Stairs: 10 General stair comments: VC's for sequencing and general safety. Pt became very anxious about half way up flight with two rails and heavy min A under belt and axillary region. Pt legs began to buckle on descent and required mod A to avoid fall. Pt required max cues to let go of railing and obtain walker to move away from stairs. Pt very slow processing and problem solving throughout.    Wheelchair Mobility    Modified Rankin (Stroke Patients Only)       Balance Overall balance assessment: Needs assistance Sitting-balance support: Feet supported Sitting balance-Leahy Scale: Good     Standing balance support: Bilateral upper extremity supported;During functional activity Standing balance-Leahy Scale: Fair Standing balance comment: BUE support on RW to remain standing.                             Cognition Arousal/Alertness: Awake/alert Behavior During Therapy: WFL for tasks assessed/performed Overall Cognitive Status: Impaired/Different from baseline Area of Impairment: Problem solving;Following commands                       Following Commands: Follows one step commands with increased time;Follows multi-step commands  inconsistently Safety/Judgement: Decreased awareness of safety;Decreased awareness of deficits   Problem Solving: Slow processing;Requires verbal cues;Decreased initiation General Comments: continues to have a response delay      Exercises      General Comments        Pertinent Vitals/Pain Pain Assessment: No/denies pain     Home Living                      Prior Function            PT Goals (current goals can now be found in the care plan section) Acute Rehab PT Goals Patient Stated Goal: go home on tuesday PT Goal Formulation: With patient Time For Goal Achievement: 09/08/18 Potential to Achieve Goals: Good    Frequency    Min 2X/week      PT Plan Current plan remains appropriate    Co-evaluation              AM-PAC PT "6 Clicks" Mobility   Outcome Measure  Help needed turning from your back to your side while in a flat bed without using bedrails?: A Little Help needed moving from lying on your back to sitting on the side of a flat bed without using bedrails?: A Little Help needed moving to and from a bed to a chair (including a wheelchair)?: A Little Help needed standing up from a chair using your arms (e.g., wheelchair or bedside chair)?: A Little Help needed to walk in hospital room?: A Little Help needed climbing 3-5 steps with a railing? : A Lot 6 Click Score: 17    End of Session Equipment Utilized During Treatment: Gait belt Activity Tolerance: Patient tolerated treatment well;Patient limited by fatigue Patient left: in chair;with chair alarm set;with call bell/phone within reach Nurse Communication: Mobility status(nurse tech notified of need for cath replacemen) PT Visit Diagnosis: Unsteadiness on feet (R26.81);Muscle weakness (generalized) (M62.81);Difficulty in walking, not elsewhere classified (R26.2)     Time: 6378-5885 PT Time Calculation (min) (ACUTE ONLY): 27 min  Charges:  $Gait Training: 8-22 mins $Therapeutic Activity: 8-22 mins                     Maryelizabeth Kaufmann, SPTA   Maryelizabeth Kaufmann 08/25/2018, 3:41 PM

## 2018-08-25 NOTE — Progress Notes (Signed)
PROGRESS NOTE    Sandra Sheppard  KJZ:[id number] DOB: 28-Nov-1945 DOA: 08/08/2018 PCP: Prince Solian, MD   Brief Narrative: 73 year old past medical history significant for hypertension, diabetes, stage III chronic kidney disease with a creatinine baseline 1.5-2.0 who presented with nausea vomiting and diarrhea.  She was found to be in acute acute on chronic renal failure with a creatinine at 12.  Nephrologist was consulted, working diagnosis is ischemic acute tubular necrosis with concomitant use of lisinopril/hydrochlorothiazide and metformin.  M spike was negative.  Renal ultrasound was negative for hydronephrosis.  Patient was treated with IV fluids.  Subsequently creatinine function has been improving slowly.    Assessment & Plan:   Principal Problem:   AKI (acute kidney injury) ([place]) Active Problems:   HYPERTENSION, MILD   CKD (chronic kidney disease) stage 3, GFR 30-59 ml/min (HCC)   Hyperkalemia   Metabolic acidosis, increased anion gap   Normocytic anemia   Diabetes mellitus type II, non insulin dependent (HCC)   Hypocalcemia   FTT (failure to thrive) in adult   Acute renal failure with acute tubular necrosis superimposed on stage 3 chronic kidney disease ([place])   Palliative care encounter   1-Acute on chronic kidney disease a stage III in the setting of probably ischemic ATN in the setting of dehydration and lisinopril/hydrochlorothiazide and metformin use. She was treated with IV fluids. Per nephrology okay to discharge patient with close follow-up.  Patient will need to be met in 3 to 4 days. Per Dr [employer] with nephrology, patient will  need renal function in 3 to 4 days after  discharge, results can be called to [place] [facility] and associated to Dr. Justin Mend attention. Follow up with nephrology in 2-4 weeks.  Cr continue to decrease today at 7.7-------7.8----8 urne out put at times not accurate due to incontinence   2-Generalized edema, bilateral arm edema. Venous Doppler  lower extremity negative for DVT. Improving. Weight trending down 130---118  3-Hyperkalemia on admission.  Related to AKI and ace inhibitor.  Resolved.  4-normocytic anemia: B12 low at 117.  She has received intramuscular supplements.  Continue with oral supplements.  Monitor hemoglobin.  Hypertension: Hydrochlorothiazide and lisinopril have been discontinued due to AKI. Started on Norvasc.  Diabetes: Continue with a sliding scale insulin.  Hyperlipidemia: Continue with pravastatin.  Acute confusional state: Seroquel at bedtime as needed.  Haldol IV as needed.  Last dose of 12/29.  She had an episode of confusion 3-13 with some hallucination. She is alert and oriented today 3-14  Lactic acidosis in the setting of renal failure.  Increase bicarb on 3-13. Gap close, bicarb at 21  Hypoalbuminemia; started protein supplement.   Estimated body mass index is 20.27 kg/m as calculated from the following:   Height as of this encounter: '5\' 4"'  (1.626 m).   Weight as of this encounter: 53.6 kg.   DVT prophylaxis: Heparin Code Status: Partial code Family Communication: Care discussed with patient Disposition Plan: I discussed case with insurance physician, they declined the case because they thought that patient was still too high acutely for skilled nursing facility.  They are recommending to repeat lab work on Monday and resubmit claim  Consultants:  = Nephrology Palliative care consultation CIR   Procedures:   Pack of red blood cell transfusion  Antimicrobials: None  Subjective: She has been out of the bed, sitting in recliner. She goes to bathroom with assistance.  Denies pain   Objective: Vitals:   08/24/18 1616 08/24/18 2057 08/25/18 0558 08/25/18  0923  BP: 132/63 (!) 152/70 (!) 148/66 (!) 125/55  Pulse: 64 78 77 72  Resp: '18 18 16 16  ' Temp: 97.9 F (36.6 C) 98.2 F (36.8 C) 98.4 F (36.9 C) 98.5 F (36.9 C)  TempSrc: Oral Oral Oral Oral  SpO2: 99% 95% 95% 96%   Weight:  53.6 kg    Height:        Intake/Output Summary (Last 24 hours) at 08/25/2018 1133 Last data filed at 08/25/2018 1125 Gross per 24 hour  Intake 750 ml  Output 550 ml  Net 200 ml   Filed Weights   08/22/18 2050 08/23/18 2054 08/24/18 2057  Weight: 59.1 kg 59.1 kg 53.6 kg    Examination:  General exam: NAD Respiratory system: CTA Cardiovascular system: S 1, S 2 RRR Gastrointestinal system: BS present, soft. nt Central nervous system: non focal.  Extremities: symmetric power, less generalized edema.   Data Reviewed: I have personally reviewed following labs and imaging studies  CBC: Recent Labs  Lab 08/20/18 0632 08/21/18 1138 08/23/18 1509  WBC 6.9 8.1 6.4  NEUTROABS 4.7 5.8  --   HGB 7.6* 11.0* 9.6*  HCT 23.9* 33.7* 29.4*  MCV 89.8 87.3 90.2  PLT 205 261 116   Basic Metabolic Panel: Recent Labs  Lab 08/21/18 1138 08/22/18 0328 08/23/18 0505 08/24/18 0334 08/25/18 0542  NA 138 139 138 139 137  K 4.4 4.4 4.3 4.1 4.0  CL 102 104 104 100 103  CO2 20* 23 20* 21* 21*  GLUCOSE 115* 109* 135* 123* 138*  BUN 65* 64* 63* 60* 60*  CREATININE 9.10* 8.65* 8.25* 7.82* 7.79*  CALCIUM 10.1 9.9 9.6 10.7* 9.5  PHOS 3.5 3.5 3.8 3.8 3.5   GFR: Estimated Creatinine Clearance: 5.4 mL/min (A) (by C-G formula based on SCr of 7.79 mg/dL (H)). Liver Function Tests: Recent Labs  Lab 08/21/18 1138 08/22/18 0328 08/23/18 0505 08/24/18 0334 08/25/18 0542  ALBUMIN 3.0* 2.7* 2.6* 2.6* 2.6*   No results for input(s): LIPASE, AMYLASE in the last 168 hours. No results for input(s): AMMONIA in the last 168 hours. Coagulation Profile: No results for input(s): INR, PROTIME in the last 168 hours. Cardiac Enzymes: Recent Labs  Lab 08/20/18 1007  CKTOTAL 33*   BNP (last 3 results) No results for input(s): PROBNP in the last 8760 hours. HbA1C: No results for input(s): HGBA1C in the last 72 hours. CBG: Recent Labs  Lab 08/24/18 1127 08/24/18 1614 08/24/18 2100  08/25/18 0708 08/25/18 0739  GLUCAP 103* 199* 119* 132* 131*   Lipid Profile: No results for input(s): CHOL, HDL, LDLCALC, TRIG, CHOLHDL, LDLDIRECT in the last 72 hours. Thyroid Function Tests: No results for input(s): TSH, T4TOTAL, FREET4, T3FREE, THYROIDAB in the last 72 hours. Anemia Panel: No results for input(s): VITAMINB12, FOLATE, FERRITIN, TIBC, IRON, RETICCTPCT in the last 72 hours. Sepsis Labs: No results for input(s): PROCALCITON, LATICACIDVEN in the last 168 hours.  No results found for this or any previous visit (from the past 240 hour(s)).       Radiology Studies: No results found.      Scheduled Meds: . sodium chloride   Intravenous Once  . amLODipine  10 mg Oral Daily  . aspirin EC  81 mg Oral Daily  . calcitRIOL  0.25 mcg Oral Daily  . calcium acetate  1,334 mg Oral TID WC  . feeding supplement (NEPRO CARB STEADY)  237 mL Oral BID BM  . ferrous sulfate  325 mg Oral TID WC  .  heparin  5,000 Units Subcutaneous Q8H  . insulin aspart  0-5 Units Subcutaneous QHS  . insulin aspart  0-9 Units Subcutaneous TID WC  . nicotine  21 mg Transdermal Daily  . pantoprazole  40 mg Oral Daily  . pravastatin  10 mg Oral q1800  . sodium bicarbonate  1,300 mg Oral BID  . sodium chloride flush  3 mL Intravenous Q12H  . vitamin B-12  1,000 mcg Oral Daily   Continuous Infusions: . sodium chloride Stopped (08/09/18 2346)     LOS: 16 days    Time spent: 35 minutes.     Elmarie Shiley, MD [facility] Pager [phone]  If 7PM-7AM, please contact night-coverage [email or web address] Password [facility] 08/25/2018, 11:33 AM

## 2018-08-26 LAB — RENAL FUNCTION PANEL
Albumin: 2.7 g/dL — ABNORMAL LOW (ref 3.5–5.0)
Anion gap: 14 (ref 5–15)
BUN: 60 mg/dL — ABNORMAL HIGH (ref 8–23)
CO2: 21 mmol/L — ABNORMAL LOW (ref 22–32)
Calcium: 9.1 mg/dL (ref 8.9–10.3)
Chloride: 102 mmol/L (ref 98–111)
Creatinine, Ser: 7.35 mg/dL — ABNORMAL HIGH (ref 0.44–1.00)
GFR calc Af Amer: 6 mL/min — ABNORMAL LOW (ref >60)
GFR calc non Af Amer: 5 mL/min — ABNORMAL LOW (ref >60)
Glucose, Bld: 123 mg/dL — ABNORMAL HIGH (ref 70–99)
Phosphorus: 3.7 mg/dL (ref 2.5–4.6)
Potassium: 3.6 mmol/L (ref 3.5–5.1)
Sodium: 137 mmol/L (ref 135–145)

## 2018-08-26 LAB — CBC
HCT: 27.9 % — ABNORMAL LOW (ref 36.0–46.0)
Hemoglobin: 8.9 g/dL — ABNORMAL LOW (ref 12.0–15.0)
MCH: 28.3 pg (ref 26.0–34.0)
MCHC: 31.9 g/dL (ref 30.0–36.0)
MCV: 88.6 fL (ref 80.0–100.0)
Platelets: 216 10*3/uL (ref 150–400)
RBC: 3.15 MIL/uL — ABNORMAL LOW (ref 3.87–5.11)
RDW: 14.9 % (ref 11.5–15.5)
WBC: 5.6 10*3/uL (ref 4.0–10.5)
nRBC: 0 % (ref 0.0–0.2)

## 2018-08-26 LAB — GLUCOSE, CAPILLARY
Glucose-Capillary: 132 mg/dL — ABNORMAL HIGH (ref 70–99)
Glucose-Capillary: 129 mg/dL — ABNORMAL HIGH (ref 70–99)
Glucose-Capillary: 241 mg/dL — ABNORMAL HIGH (ref 70–99)
Glucose-Capillary: 169 mg/dL — ABNORMAL HIGH (ref 70–99)

## 2018-08-26 MED ORDER — LORAZEPAM 0.5 MG PO TABS
0.5000 mg | ORAL_TABLET | Freq: Once | ORAL | Status: AC
  Administered 2018-08-26: 0.5 mg via ORAL
  Filled 2018-08-26: qty 1

## 2018-08-26 MED ORDER — DOCUSATE SODIUM 100 MG PO CAPS
100.0000 mg | ORAL_CAPSULE | Freq: Every day | ORAL | Status: DC | PRN
  Administered 2018-08-26 – 2018-08-30 (×3): 100 mg via ORAL
  Filled 2018-08-26 (×3): qty 1

## 2018-08-26 NOTE — Plan of Care (Signed)
  Problem: Education: Goal: Knowledge of disease and its progression will improve Outcome: Progressing   Problem: Health Behavior/Discharge Planning: Goal: Ability to manage health-related needs will improve Outcome: Progressing   Problem: Clinical Measurements: Goal: Complications related to the disease process or treatment will be avoided or minimized Outcome: Progressing Goal: Dialysis access will remain free of complications Outcome: Progressing   Problem: Nutritional: Goal: Ability to make appropriate dietary choices will improve Outcome: Progressing   Problem: Respiratory: Goal: Respiratory symptoms related to disease process will be avoided Outcome: Progressing   Problem: Urinary Elimination: Goal: Progression of disease will be identified and treated Outcome: Progressing

## 2018-08-26 NOTE — Progress Notes (Signed)
PROGRESS NOTE    Sandra Sheppard  GUY:[id number] DOB: Mar 26, 1946 DOA: 08/08/2018 PCP: Prince Solian, MD   Brief Narrative: 73 year old past medical history significant for hypertension, diabetes, stage III chronic kidney disease with a creatinine baseline 1.5-2.0 who presented with nausea vomiting and diarrhea.  She was found to be in acute acute on chronic renal failure with a creatinine at 12.  Nephrologist was consulted, working diagnosis is ischemic acute tubular necrosis with concomitant use of lisinopril/hydrochlorothiazide and metformin.  M spike was negative.  Renal ultrasound was negative for hydronephrosis.  Patient was treated with IV fluids.  Subsequently creatinine function has been improving slowly.    Assessment & Plan:   Principal Problem:   AKI (acute kidney injury) ([place]) Active Problems:   HYPERTENSION, MILD   CKD (chronic kidney disease) stage 3, GFR 30-59 ml/min (HCC)   Hyperkalemia   Metabolic acidosis, increased anion gap   Normocytic anemia   Diabetes mellitus type II, non insulin dependent (HCC)   Hypocalcemia   FTT (failure to thrive) in adult   Acute renal failure with acute tubular necrosis superimposed on stage 3 chronic kidney disease ([place])   Palliative care encounter   1-Acute on chronic kidney disease a stage III in the setting of probably ischemic ATN in the setting of dehydration and lisinopril/hydrochlorothiazide and metformin use. She was treated with IV fluids. Per nephrology okay to discharge patient with close follow-up.  Patient will need to be met in 3 to 4 days. Per Dr [employer] with nephrology, patient will  need renal function in 3 to 4 days after  discharge, results can be called to [facility] and associated to Dr. Justin Mend attention. Follow up with nephrology in 2-4 weeks.  Cr continue to decrease today at 7.3----7.7-------7.8----8 Volume status improve, weight down to 118---form 130  She has episode of incontinence.   2-Generalized edema,  bilateral arm edema. Venous Doppler lower extremity negative for DVT. Improving. Weight trending down 130---118  3-Hyperkalemia on admission.  Related to AKI and ace inhibitor.  Resolved.  4-normocytic anemia: B12 low at 117.  She has received intramuscular supplements.  Continue with oral supplements.  Monitor hb.    Hypertension: Hydrochlorothiazide and lisinopril have been discontinued due to AKI. Started on Norvasc. BP stable.   Diabetes: Continue with a sliding scale insulin.  Hyperlipidemia: Continue with pravastatin.  Acute confusional state: Seroquel at bedtime as needed.  Haldol IV as needed.  Last dose of 12/29.  She had an episode of confusion 3-13 with some hallucination. She is alert and oriented today 3-14  Lactic acidosis in the setting of renal failure.  Increase bicarb on 3-13. Gap close, bicarb at 21  Hypoalbuminemia; started protein supplement.   Estimated body mass index is 20.28 kg/m as calculated from the following:   Height as of this encounter: '5\' 4"'  (1.626 m).   Weight as of this encounter: 53.6 kg.   DVT prophylaxis: Heparin Code Status: Partial code Family Communication: Care discussed with patient Disposition Plan: I discussed case with insurance physician, they declined the case because they thought that patient was still too high acutely for skilled nursing facility.  They are recommending to repeat lab work on Monday and resubmit claim  Consultants:  = Nephrology Palliative care consultation CIR   Procedures:   Pack of red blood cell transfusion  Antimicrobials: None  Subjective: She is alert, denies pain.  She has been getting help getting to recliner and going to bathroom.    Objective: Vitals:  08/25/18 1634 08/25/18 2035 08/26/18 0446 08/26/18 0902  BP: (!) 141/59 126/63 (!) 158/64 (!) 146/65  Pulse: 61 75 77 70  Resp: '16 15 15 18  ' Temp: 98.6 F (37 C) 98.3 F (36.8 C) 98.4 F (36.9 C) 98.6 F (37 C)  TempSrc: Oral  Oral  Oral  SpO2: 99% 99% 94% 97%  Weight:  53.6 kg    Height:        Intake/Output Summary (Last 24 hours) at 08/26/2018 1219 Last data filed at 08/26/2018 1100 Gross per 24 hour  Intake 490 ml  Output 700 ml  Net -210 ml   Filed Weights   08/23/18 2054 08/24/18 2057 08/25/18 2035  Weight: 59.1 kg 53.6 kg 53.6 kg    Examination:  General exam: NAD Respiratory system: CTA Cardiovascular system: S 1, S 2 RRR Gastrointestinal system: BS present, soft, nt Central nervous system: Non focal.  Extremities: symmetric power, less LE edema,  Data Reviewed: I have personally reviewed following labs and imaging studies  CBC: Recent Labs  Lab 08/20/18 0632 08/21/18 1138 08/23/18 1509 08/26/18 0252  WBC 6.9 8.1 6.4 5.6  NEUTROABS 4.7 5.8  --   --   HGB 7.6* 11.0* 9.6* 8.9*  HCT 23.9* 33.7* 29.4* 27.9*  MCV 89.8 87.3 90.2 88.6  PLT 205 261 235 001   Basic Metabolic Panel: Recent Labs  Lab 08/22/18 0328 08/23/18 0505 08/24/18 0334 08/25/18 0542 08/26/18 0252  NA 139 138 139 137 137  K 4.4 4.3 4.1 4.0 3.6  CL 104 104 100 103 102  CO2 23 20* 21* 21* 21*  GLUCOSE 109* 135* 123* 138* 123*  BUN 64* 63* 60* 60* 60*  CREATININE 8.65* 8.25* 7.82* 7.79* 7.35*  CALCIUM 9.9 9.6 10.7* 9.5 9.1  PHOS 3.5 3.8 3.8 3.5 3.7   GFR: Estimated Creatinine Clearance: 5.8 mL/min (A) (by C-G formula based on SCr of 7.35 mg/dL (H)). Liver Function Tests: Recent Labs  Lab 08/22/18 0328 08/23/18 0505 08/24/18 0334 08/25/18 0542 08/26/18 0252  ALBUMIN 2.7* 2.6* 2.6* 2.6* 2.7*   No results for input(s): LIPASE, AMYLASE in the last 168 hours. No results for input(s): AMMONIA in the last 168 hours. Coagulation Profile: No results for input(s): INR, PROTIME in the last 168 hours. Cardiac Enzymes: Recent Labs  Lab 08/20/18 1007  CKTOTAL 33*   BNP (last 3 results) No results for input(s): PROBNP in the last 8760 hours. HbA1C: No results for input(s): HGBA1C in the last 72 hours.  CBG: Recent Labs  Lab 08/25/18 1137 08/25/18 1618 08/25/18 2036 08/26/18 0730 08/26/18 1108  GLUCAP 120* 288* 224* 132* 129*   Lipid Profile: No results for input(s): CHOL, HDL, LDLCALC, TRIG, CHOLHDL, LDLDIRECT in the last 72 hours. Thyroid Function Tests: No results for input(s): TSH, T4TOTAL, FREET4, T3FREE, THYROIDAB in the last 72 hours. Anemia Panel: No results for input(s): VITAMINB12, FOLATE, FERRITIN, TIBC, IRON, RETICCTPCT in the last 72 hours. Sepsis Labs: No results for input(s): PROCALCITON, LATICACIDVEN in the last 168 hours.  No results found for this or any previous visit (from the past 240 hour(s)).       Radiology Studies: No results found.      Scheduled Meds: . sodium chloride   Intravenous Once  . amLODipine  10 mg Oral Daily  . aspirin EC  81 mg Oral Daily  . calcitRIOL  0.25 mcg Oral Daily  . calcium acetate  1,334 mg Oral TID WC  . feeding supplement (NEPRO CARB STEADY)  237  mL Oral BID BM  . ferrous sulfate  325 mg Oral TID WC  . heparin  5,000 Units Subcutaneous Q8H  . insulin aspart  0-5 Units Subcutaneous QHS  . insulin aspart  0-9 Units Subcutaneous TID WC  . nicotine  21 mg Transdermal Daily  . pantoprazole  40 mg Oral Daily  . pravastatin  10 mg Oral q1800  . sodium bicarbonate  1,300 mg Oral BID  . sodium chloride flush  3 mL Intravenous Q12H  . vitamin B-12  1,000 mcg Oral Daily   Continuous Infusions: . sodium chloride Stopped (08/09/18 2346)     LOS: 17 days    Time spent: 35 minutes.     Elmarie Shiley, MD [facility] Pager [phone]  If 7PM-7AM, please contact night-coverage [email or web address] Password [facility] 08/26/2018, 12:19 PM

## 2018-08-27 ENCOUNTER — Inpatient Hospital Stay (HOSPITAL_COMMUNITY): Payer: Medicare HMO

## 2018-08-27 LAB — RENAL FUNCTION PANEL
Albumin: 2.8 g/dL — ABNORMAL LOW (ref 3.5–5.0)
Anion gap: 12 (ref 5–15)
BUN: 62 mg/dL — ABNORMAL HIGH (ref 8–23)
CO2: 20 mmol/L — ABNORMAL LOW (ref 22–32)
Calcium: 9.2 mg/dL (ref 8.9–10.3)
Chloride: 105 mmol/L (ref 98–111)
Creatinine, Ser: 6.46 mg/dL — ABNORMAL HIGH (ref 0.44–1.00)
GFR calc Af Amer: 7 mL/min — ABNORMAL LOW (ref >60)
GFR calc non Af Amer: 6 mL/min — ABNORMAL LOW (ref >60)
Glucose, Bld: 153 mg/dL — ABNORMAL HIGH (ref 70–99)
Phosphorus: 2.8 mg/dL (ref 2.5–4.6)
Potassium: 3.8 mmol/L (ref 3.5–5.1)
Sodium: 137 mmol/L (ref 135–145)

## 2018-08-27 LAB — GLUCOSE, CAPILLARY
Glucose-Capillary: 146 mg/dL — ABNORMAL HIGH (ref 70–99)
Glucose-Capillary: 123 mg/dL — ABNORMAL HIGH (ref 70–99)
Glucose-Capillary: 275 mg/dL — ABNORMAL HIGH (ref 70–99)
Glucose-Capillary: 221 mg/dL — ABNORMAL HIGH (ref 70–99)

## 2018-08-27 LAB — AMMONIA: Ammonia: 17 umol/L (ref 9–35)

## 2018-08-27 IMAGING — MR MRI HEAD WITHOUT CONTRAST
9 of 10 series · 35 of 48 positions shown · non-contrast
Comparison: None.

CLINICAL DATA: Altered level of consciousness.

EXAM:
MRI HEAD WITHOUT CONTRAST
TECHNIQUE: Multiplanar, multiecho pulse sequences of the brain and surrounding
structures were obtained without intravenous contrast.

[Series 3: DWI · axial · 3.0mm · 1.09mm/px · z∈[-72,+81]mm · 8 of 104 slices shown (1 of 4)]
[im 1/104]
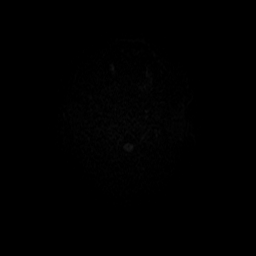
[im 12/104]
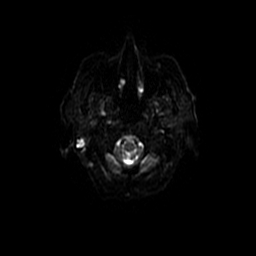
[im 35/104]
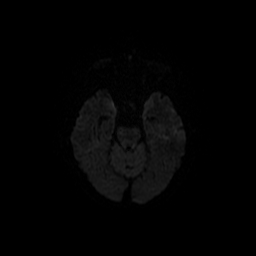
[im 46/104]
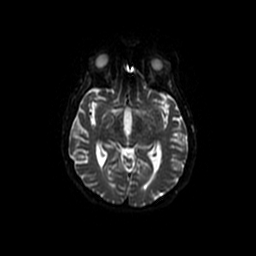
[im 58/104]
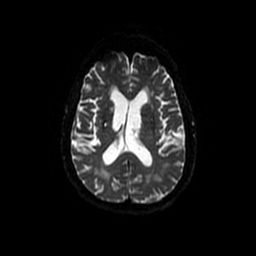
[im 69/104]
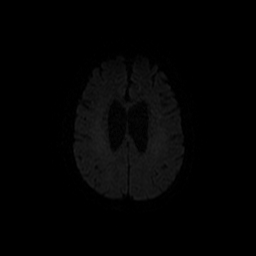
[im 92/104]
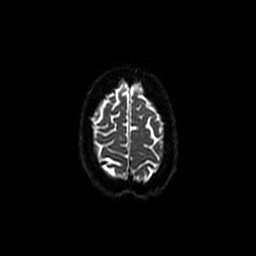
[im 104/104]
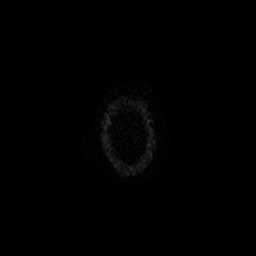

[Series 4: DWI · coronal · 5.0mm · 1.09mm/px · 7 of 74 slices shown (2 of 4)]
[im 1/74]
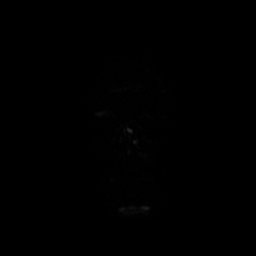
[im 13/74]
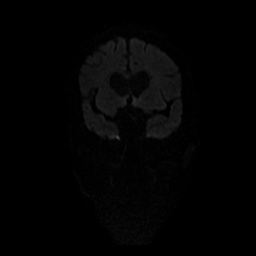
[im 25/74]
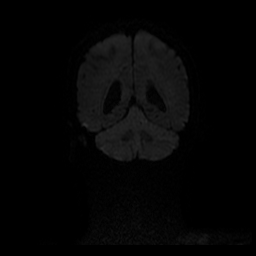
[im 37/74]
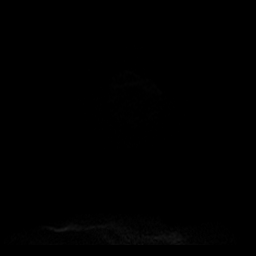
[im 49/74]
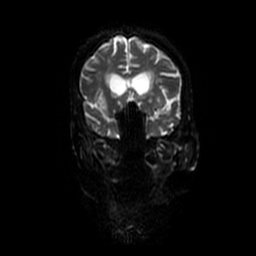
[im 61/74]
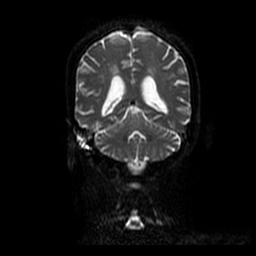
[im 74/74]
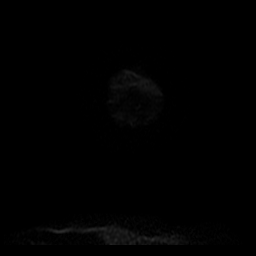

[Series 5: T1 · sagittal · 5.0mm · 0.47mm/px · 2 of 26 slices shown]
[im 1/26]
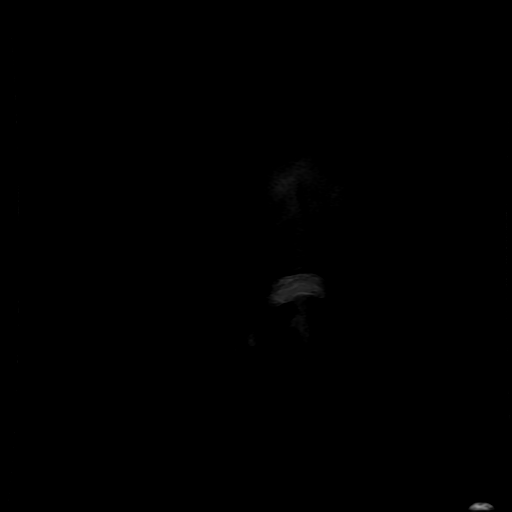
[im 26/26]
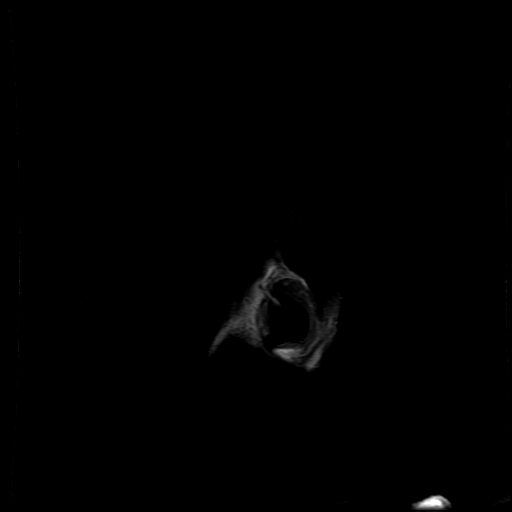

[Series 6: T2 · axial · 5.0mm · 0.43mm/px · z∈[-72,+90]mm · 3 of 28 slices shown (1 of 2)]
[im 1/28]
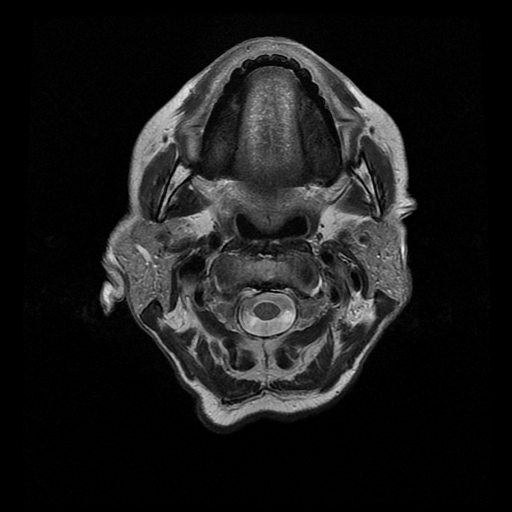
[im 14/28]
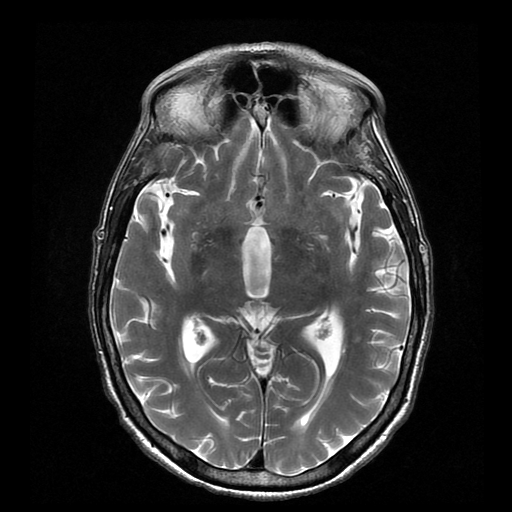
[im 28/28]
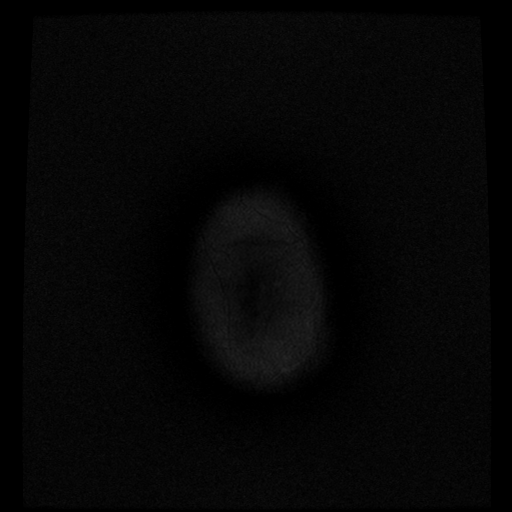

[Series 7: FLAIR · axial · 3.0mm · 0.43mm/px · z∈[-72,+90]mm · 3 of 28 slices shown]
[im 1/28]
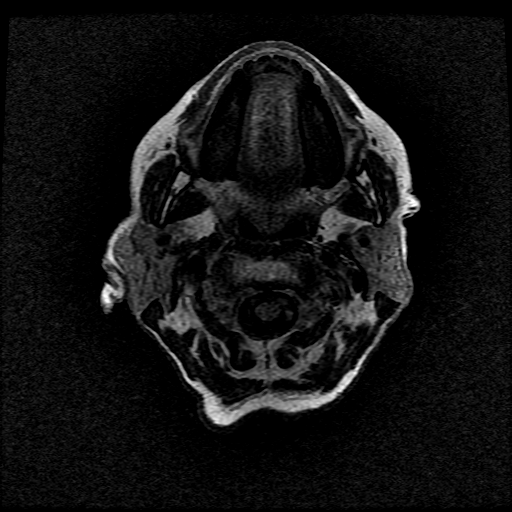
[im 14/28]
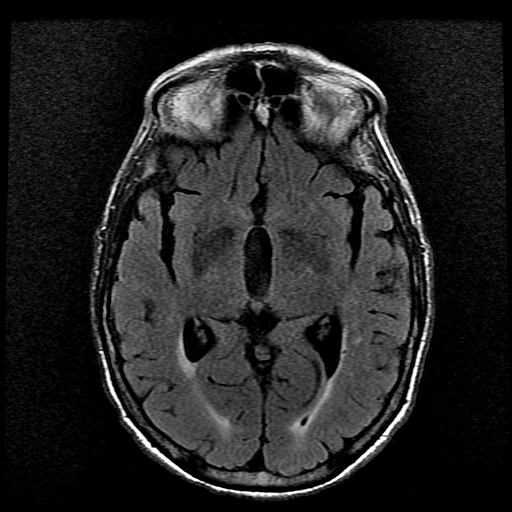
[im 28/28]
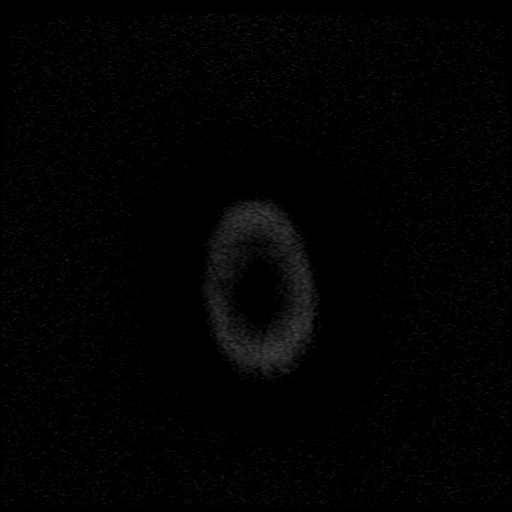

[Series 8: ax mpgr · axial · 5.0mm · 0.43mm/px · 1 of 24 slices shown]
[im 1/24]
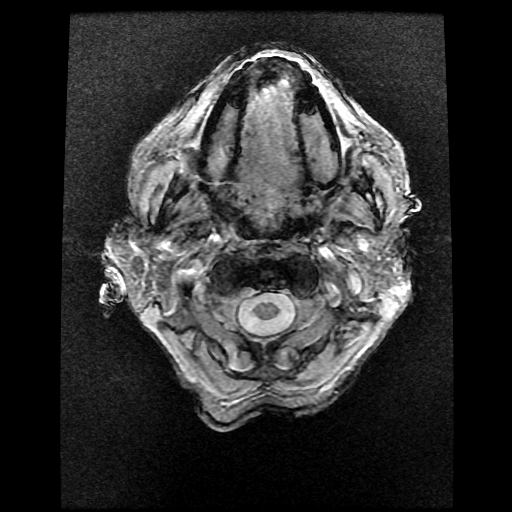

[Series 10: T2 · coronal · 5.0mm · 0.39mm/px · 3 of 28 slices shown (2 of 2)]
[im 1/28]
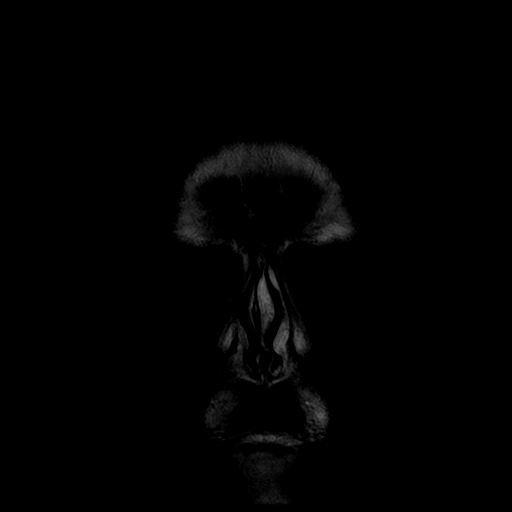
[im 14/28]
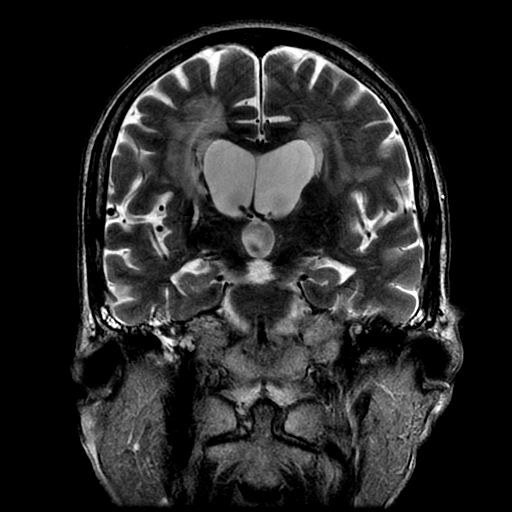
[im 28/28]
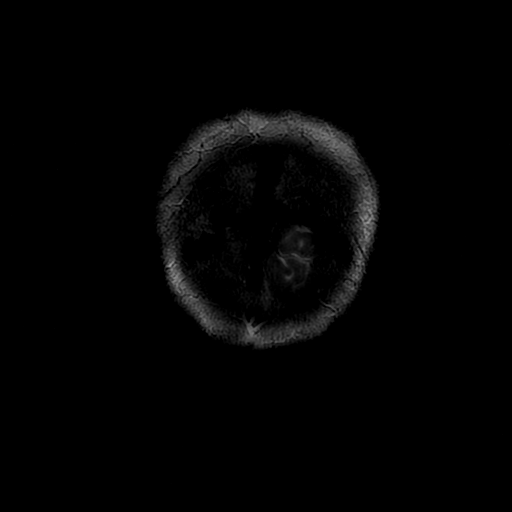

[Series 300: DWI · axial · 3.0mm · 1.09mm/px · z∈[-72,+81]mm · 5 of 52 slices shown (3 of 4)]
[im 1/52]
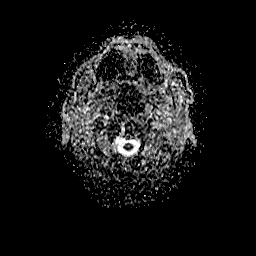
[im 13/52]
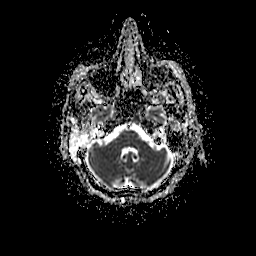
[im 26/52]
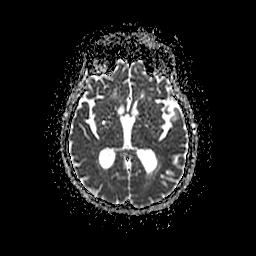
[im 39/52]
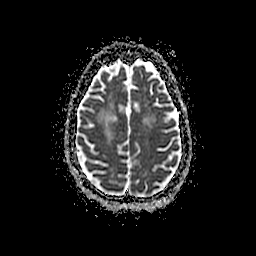
[im 52/52]
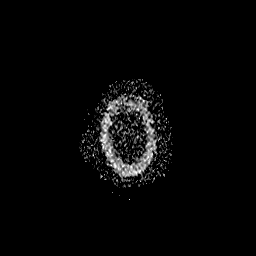

[Series 400: DWI · coronal · 5.0mm · 1.09mm/px · 3 of 36 slices shown (4 of 4)]
[im 1/36]
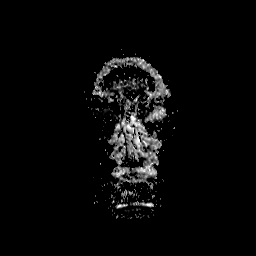
[im 18/36]
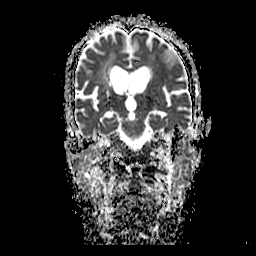
[im 36/36]
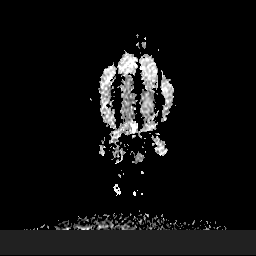

[35 of 48 positions shown; findings below may reference images not displayed]

FINDINGS: Brain: There is no evidence of acute infarct, mass, midline shift,
or extra-axial fluid collection. Chronic microhemorrhages are noted
in the left and possibly right cerebellar hemispheres. There are
chronic lacunar infarcts in the right basal ganglia with a small
amount of associated chronic blood products. Chronic lacunar
infarcts are also noted in the right corona radiata and left
cerebellum. Patchy to confluent T2 hyperintensities throughout the
cerebral white matter bilaterally are nonspecific but compatible
with moderate to severe chronic small vessel ischemic disease. There
is moderate cerebral atrophy.

Vascular: Major intracranial vascular flow voids are preserved.

Skull and upper cervical spine: Unremarkable bone marrow signal.

Sinuses/Orbits: Unremarkable orbits. Clear paranasal sinuses. Large
right and small left mastoid effusions. Right middle ear effusion.

Other: None.
IMPRESSION: 1. No acute intracranial abnormality.
2. Moderate to severe chronic small vessel ischemic disease with
chronic lacunar infarcts as above.
3. Right larger than left mastoid and right middle ear effusions.

## 2018-08-27 NOTE — Progress Notes (Signed)
   08/26/18 1932  Provider Notification  Provider Name/Title Bodenheimer NP  Date Provider Notified 08/26/18  Time Provider Notified 1932  Notification Type Page  Notification Reason Requested by patient/family (c/o constipation)  Response See new orders  Date of Provider Response 08/26/18  Time of Provider Response 1935   New orders received and implemented.  Will continue to monitor patient.  Earleen Reaper RN

## 2018-08-27 NOTE — Progress Notes (Signed)
   08/26/18 2122  Provider Notification  Provider Name/Title Bodenheimer NP  Date Provider Notified 08/26/18  Time Provider Notified 2122  Notification Type Page  Notification Reason Requested by patient/family    Patient is very anxious and irritable.  She is also seeing children in her room that are not there.  She becomes very upset when you attempt to reorient her.  She also states she wants to go live with her brother in [place].  She stated he is retired and would be able to help care for her.  [facility] called and made aware.  New orders received and implemented.  Will continue to monitor patient.  Earleen Reaper RN

## 2018-08-27 NOTE — Progress Notes (Signed)
PROGRESS NOTE    Sandra Sheppard  BMW:[id number] DOB: 1946/06/10 DOA: 08/08/2018 PCP: Prince Solian, MD   Brief Narrative: 73 year old past medical history significant for hypertension, diabetes, stage III chronic kidney disease with a creatinine baseline 1.5-2.0 who presented with nausea vomiting and diarrhea.  She was found to be in acute acute on chronic renal failure with a creatinine at 12.  Nephrologist was consulted, working diagnosis is ischemic acute tubular necrosis with concomitant use of lisinopril/hydrochlorothiazide and metformin.  M spike was negative.  Renal ultrasound was negative for hydronephrosis.  Patient was treated with IV fluids.  Subsequently creatinine function has been improving slowly.    Assessment & Plan:   Principal Problem:   AKI (acute kidney injury) ([place]) Active Problems:   HYPERTENSION, MILD   CKD (chronic kidney disease) stage 3, GFR 30-59 ml/min (HCC)   Hyperkalemia   Metabolic acidosis, increased anion gap   Normocytic anemia   Diabetes mellitus type II, non insulin dependent (HCC)   Hypocalcemia   FTT (failure to thrive) in adult   Acute renal failure with acute tubular necrosis superimposed on stage 3 chronic kidney disease ([place])   Palliative care encounter   1-Acute on chronic kidney disease a stage III in the setting of probably ischemic ATN in the setting of dehydration and lisinopril/hydrochlorothiazide and metformin use. She was treated with IV fluids. Per nephrology okay to discharge patient with close follow-up.  Patient will need to be met in 3 to 4 days. Per Dr [employer] with nephrology, patient will  need renal function in 3 to 4 days after  discharge, results can be called to [facility] and associated to Dr. Justin Mend attention. Follow up with nephrology in 2-4 weeks.  Cr continue to decrease today at 6.4------7.3----7.7-------7.8----8 Volume status improve, weight down to 118---form 130  She has episode of incontinence.    2-Generalized edema, bilateral arm edema. Venous Doppler lower extremity negative for DVT. Improving. Weight trending down 130---118 Patient with less generalized edema.   3-Hyperkalemia on admission.  Related to AKI and ace inhibitor.  Resolved.  4-normocytic anemia: B12 low at 117.  She has received intramuscular supplements.  Continue with oral supplements.  Monitor hb.    Hypertension: Hydrochlorothiazide and lisinopril have been discontinued due to AKI. Started on Norvasc. BP stable.   Diabetes: Continue with a sliding scale insulin.  Hyperlipidemia: Continue with pravastatin.  Acute confusional state: Seroquel at bedtime as needed.  Haldol IV as needed.  Last dose of 12/29.  She had an episode of confusion 3-13 with some hallucination. She is alert and oriented today 3-14  She is confuse today. Will check MRI and Ammonia level.  Suspect delirium.   Lactic acidosis in the setting of renal failure.  Increase bicarb on 3-13. Gap close, bicarb at 21  Hypoalbuminemia; started protein supplement.   Estimated body mass index is 20.28 kg/m as calculated from the following:   Height as of this encounter: '5\' 4"'  (1.626 m).   Weight as of this encounter: 53.6 kg.   DVT prophylaxis: Heparin Code Status: Partial code Family Communication: Care discussed with patient Disposition Plan: I discussed case with insurance physician, they declined the case because they thought that patient was still too high acutely for skilled nursing facility.  They are recommending to repeat lab work on Monday and resubmit claim  Consultants:  = Nephrology Palliative care consultation CIR   Procedures:   Pack of red blood cell transfusion  Antimicrobials: None  Subjective:  She  is alert, she is having hallucination.  Denies pain.   Objective: Vitals:   08/26/18 1600 08/26/18 2147 08/27/18 0500 08/27/18 0627  BP: (!) 131/58 140/65  (!) 153/64  Pulse: 72 76  75  Resp: '18 16  16  ' Temp:  97.8 F (36.6 C) 98.2 F (36.8 C)  98.1 F (36.7 C)  TempSrc: Oral Oral    SpO2: 98% 94%  96%  Weight:  53.6 kg 53.6 kg   Height:        Intake/Output Summary (Last 24 hours) at 08/27/2018 0539 Last data filed at 08/27/2018 7673 Gross per 24 hour  Intake 750 ml  Output 525 ml  Net 225 ml   Filed Weights   08/25/18 2035 08/26/18 2147 08/27/18 0500  Weight: 53.6 kg 53.6 kg 53.6 kg    Examination:  General exam: NAD Respiratory system: CTA Cardiovascular system: S 1, S 2 RRR Gastrointestinal system: BS present, soft,  Central nervous system: non focal. Confuse.  Extremities: symmetric power, less LE edema,  Data Reviewed: I have personally reviewed following labs and imaging studies  CBC: Recent Labs  Lab 08/21/18 1138 08/23/18 1509 08/26/18 0252  WBC 8.1 6.4 5.6  NEUTROABS 5.8  --   --   HGB 11.0* 9.6* 8.9*  HCT 33.7* 29.4* 27.9*  MCV 87.3 90.2 88.6  PLT 261 235 419   Basic Metabolic Panel: Recent Labs  Lab 08/23/18 0505 08/24/18 0334 08/25/18 0542 08/26/18 0252 08/27/18 0732  NA 138 139 137 137 137  K 4.3 4.1 4.0 3.6 3.8  CL 104 100 103 102 105  CO2 20* 21* 21* 21* 20*  GLUCOSE 135* 123* 138* 123* 153*  BUN 63* 60* 60* 60* 62*  CREATININE 8.25* 7.82* 7.79* 7.35* 6.46*  CALCIUM 9.6 10.7* 9.5 9.1 9.2  PHOS 3.8 3.8 3.5 3.7 2.8   GFR: Estimated Creatinine Clearance: 6.6 mL/min (A) (by C-G formula based on SCr of 6.46 mg/dL (H)). Liver Function Tests: Recent Labs  Lab 08/23/18 0505 08/24/18 0334 08/25/18 0542 08/26/18 0252 08/27/18 0732  ALBUMIN 2.6* 2.6* 2.6* 2.7* 2.8*   No results for input(s): LIPASE, AMYLASE in the last 168 hours. No results for input(s): AMMONIA in the last 168 hours. Coagulation Profile: No results for input(s): INR, PROTIME in the last 168 hours. Cardiac Enzymes: Recent Labs  Lab 08/20/18 1007  CKTOTAL 33*   BNP (last 3 results) No results for input(s): PROBNP in the last 8760 hours. HbA1C: No results for  input(s): HGBA1C in the last 72 hours. CBG: Recent Labs  Lab 08/26/18 0730 08/26/18 1108 08/26/18 1557 08/26/18 2148 08/27/18 0724  GLUCAP 132* 129* 241* 169* 146*   Lipid Profile: No results for input(s): CHOL, HDL, LDLCALC, TRIG, CHOLHDL, LDLDIRECT in the last 72 hours. Thyroid Function Tests: No results for input(s): TSH, T4TOTAL, FREET4, T3FREE, THYROIDAB in the last 72 hours. Anemia Panel: No results for input(s): VITAMINB12, FOLATE, FERRITIN, TIBC, IRON, RETICCTPCT in the last 72 hours. Sepsis Labs: No results for input(s): PROCALCITON, LATICACIDVEN in the last 168 hours.  No results found for this or any previous visit (from the past 240 hour(s)).       Radiology Studies: No results found.      Scheduled Meds: . sodium chloride   Intravenous Once  . amLODipine  10 mg Oral Daily  . aspirin EC  81 mg Oral Daily  . calcitRIOL  0.25 mcg Oral Daily  . calcium acetate  1,334 mg Oral TID WC  . feeding supplement (  NEPRO CARB STEADY)  237 mL Oral BID BM  . ferrous sulfate  325 mg Oral TID WC  . heparin  5,000 Units Subcutaneous Q8H  . insulin aspart  0-5 Units Subcutaneous QHS  . insulin aspart  0-9 Units Subcutaneous TID WC  . nicotine  21 mg Transdermal Daily  . pantoprazole  40 mg Oral Daily  . pravastatin  10 mg Oral q1800  . sodium bicarbonate  1,300 mg Oral BID  . sodium chloride flush  3 mL Intravenous Q12H  . vitamin B-12  1,000 mcg Oral Daily   Continuous Infusions: . sodium chloride Stopped (08/09/18 2346)     LOS: 18 days    Time spent: 35 minutes.     Elmarie Shiley, MD [facility] Pager [phone]  If 7PM-7AM, please contact night-coverage [email or web address] Password [email or web address] 08/27/2018, 9:22 AM

## 2018-08-28 LAB — RENAL FUNCTION PANEL
Albumin: 2.7 g/dL — ABNORMAL LOW (ref 3.5–5.0)
Anion gap: 10 (ref 5–15)
BUN: 65 mg/dL — ABNORMAL HIGH (ref 8–23)
CO2: 23 mmol/L (ref 22–32)
Calcium: 9.4 mg/dL (ref 8.9–10.3)
Chloride: 102 mmol/L (ref 98–111)
Creatinine, Ser: 6.15 mg/dL — ABNORMAL HIGH (ref 0.44–1.00)
GFR calc Af Amer: 7 mL/min — ABNORMAL LOW (ref >60)
GFR calc non Af Amer: 6 mL/min — ABNORMAL LOW (ref >60)
Glucose, Bld: 168 mg/dL — ABNORMAL HIGH (ref 70–99)
Phosphorus: 4 mg/dL (ref 2.5–4.6)
Potassium: 3.8 mmol/L (ref 3.5–5.1)
Sodium: 135 mmol/L (ref 135–145)

## 2018-08-28 LAB — CBC
HCT: 27.2 % — ABNORMAL LOW (ref 36.0–46.0)
Hemoglobin: 8.8 g/dL — ABNORMAL LOW (ref 12.0–15.0)
MCH: 28.9 pg (ref 26.0–34.0)
MCHC: 32.4 g/dL (ref 30.0–36.0)
MCV: 89.2 fL (ref 80.0–100.0)
Platelets: 190 10*3/uL (ref 150–400)
RBC: 3.05 MIL/uL — ABNORMAL LOW (ref 3.87–5.11)
RDW: 15.1 % (ref 11.5–15.5)
WBC: 6.6 10*3/uL (ref 4.0–10.5)
nRBC: 0 % (ref 0.0–0.2)

## 2018-08-28 LAB — GLUCOSE, CAPILLARY
Glucose-Capillary: 151 mg/dL — ABNORMAL HIGH (ref 70–99)
Glucose-Capillary: 114 mg/dL — ABNORMAL HIGH (ref 70–99)
Glucose-Capillary: 209 mg/dL — ABNORMAL HIGH (ref 70–99)
Glucose-Capillary: 169 mg/dL — ABNORMAL HIGH (ref 70–99)

## 2018-08-28 MED ORDER — BISACODYL 10 MG RE SUPP
10.0000 mg | Freq: Once | RECTAL | Status: AC
  Administered 2018-08-28: 10 mg via RECTAL
  Filled 2018-08-28: qty 1

## 2018-08-28 NOTE — Progress Notes (Signed)
Occupational Therapy Treatment Patient Details Name: Sandra Sheppard MRN: [id number] DOB: 10/15/1945 Today's Date: 08/28/2018    History of present illness Pt is a 73 y/o F with significant PMH of hypertension, type 2 diabetes mellitus, and chronic kidney disease stage III now presenting for evaluation of abnormal outpatient blood work and found to have an acute kidney injury.   OT comments  Upon arrival, pt finishing PT session. Pt with increased confusion this session stating "I don't even think this is a hospital". Pt requires minguard for ADL completion standing at sink level and minguard for functional mobility at RW level. Pt will continue to benefit from skilled OT services to maximize safety and independence with ADL and functional mobility to allow safe d/c to venue listed below. Will continue to follow acutely.     Follow Up Recommendations  SNF;Supervision/Assistance - 24 hour    Equipment Recommendations  Tub/shower bench    Recommendations for Other Services      Precautions / Restrictions Precautions Precautions: Fall Restrictions Weight Bearing Restrictions: No       Mobility Bed Mobility Overal bed mobility: Needs Assistance(Simultaneous filing. User may not have seen previous data.) Bed Mobility: Supine to Sit(Simultaneous filing. User may not have seen previous data.)     Supine to sit: Min assist;Mod assist(Simultaneous filing. User may not have seen previous data.)     General bed mobility comments: pt received in chair  Transfers Overall transfer level: Needs assistance Equipment used: Rolling walker (2 wheeled) Transfers: Sit to/from [facility] Sit to Stand: Min guard         General transfer comment: close minguard required for transfer'; Pt posterior lean upon stand required vc/tc's for hand placement on RW.    Balance Overall balance assessment: Needs assistance Sitting-balance support: Feet supported Sitting balance-Leahy  Scale: Good     Standing balance support: During functional activity;No upper extremity supported;Single extremity supported Standing balance-Leahy Scale: Fair Standing balance comment: no UE for short duration static standing;intermittent Single UE support for longer duration standing and dynamic standing balane                           ADL either performed or assessed with clinical judgement   ADL Overall ADL's : Needs assistance/impaired Eating/Feeding: Set up;Sitting   Grooming: Wash/dry hands;Standing;Min guard                   Toilet Transfer: Ambulation;RW;Min Psychiatric nurse Details (indicate cue type and reason): pt with slight incontinence, urinated on floor prior to making it on toilet Toileting- Clothing Manipulation and Hygiene: Sit to/from stand;Min guard       Functional mobility during ADLs: Rolling walker;Min guard       Vision       Perception     Praxis      Cognition Arousal/Alertness: Awake/alert Behavior During Therapy: WFL for tasks assessed/performed Overall Cognitive Status: Impaired/Different from baseline Area of Impairment: Problem solving;Following commands;Orientation;Memory;Safety/judgement;Awareness                 Orientation Level: Disoriented to;Place   Memory: Decreased short-term memory Following Commands: Follows one step commands with increased time;Follows multi-step commands inconsistently Safety/Judgement: Decreased awareness of safety;Decreased awareness of deficits Awareness: Emergent Problem Solving: Slow processing;Requires verbal cues;Decreased initiation General Comments: continues to have a response delay;stated "i still don't think this is a hospital"        Exercises  Shoulder Instructions       General Comments      Pertinent Vitals/ Pain       Pain Assessment: No/denies pain Faces Pain Scale: No hurt Pain Intervention(s): Monitored during session  Home Living                                           Prior Functioning/Environment              Frequency  Min 2X/week        Progress Toward Goals  OT Goals(current goals can now be found in the care plan section)  Progress towards OT goals: Progressing toward goals  Acute Rehab OT Goals Patient Stated Goal: "get out of here' OT Goal Formulation: With patient Time For Goal Achievement: 09/03/18 Potential to Achieve Goals: Good ADL Goals Pt Will Perform Grooming: with supervision;with set-up;with modified independence;standing Pt Will Perform Upper Body Bathing: with set-up;with modified independence Pt Will Perform Lower Body Bathing: with min assist;with min guard assist;sit to/from stand Pt Will Perform Upper Body Dressing: with set-up;with modified independence Pt Will Perform Lower Body Dressing: with min assist;with min guard assist;sit to/from stand Pt Will Transfer to Toilet: with supervision;with modified independence;ambulating Pt Will Perform Toileting - Clothing Manipulation and hygiene: with min guard assist;with supervision;sit to/from stand Pt Will Perform Tub/Shower Transfer: with supervision;with modified independence;tub bench;rolling walker;ambulating  Plan Discharge plan remains appropriate    Co-evaluation                 AM-PAC OT "6 Clicks" Daily Activity     Outcome Measure   Help from another person eating meals?: None Help from another person taking care of personal grooming?: A Little Help from another person toileting, which includes using toliet, bedpan, or urinal?: A Little Help from another person bathing (including washing, rinsing, drying)?: A Little Help from another person to put on and taking off regular upper body clothing?: None Help from another person to put on and taking off regular lower body clothing?: A Little 6 Click Score: 20    End of Session Equipment Utilized During Treatment: Gait belt;Rolling walker  OT  Visit Diagnosis: Other abnormalities of gait and mobility (R26.89);Muscle weakness (generalized) (M62.81);Other symptoms and signs involving cognitive function   Activity Tolerance Patient tolerated treatment well   Patient Left in chair;with call bell/phone within reach;with chair alarm set   Nurse Communication Mobility status        Time: 7106-2694 OT Time Calculation (min): 17 min  Charges: OT General Charges $OT Visit: 1 Visit OT Treatments $Self Care/Home Management : 8-22 mins  Dorinda Hill OTR/L [place] Office: [place] 08/28/2018, 12:20 PM

## 2018-08-28 NOTE — Progress Notes (Addendum)
PROGRESS NOTE    Sandra Sheppard  PIR:[id number] DOB: 10-Jun-1946 DOA: 08/08/2018 PCP: Prince Solian, MD   Brief Narrative: 73 year old past medical history significant for hypertension, diabetes, stage III chronic kidney disease with a creatinine baseline 1.5-2.0 who presented with nausea vomiting and diarrhea.  She was found to be in acute acute on chronic renal failure with a creatinine at 12.  Nephrologist was consulted, working diagnosis is ischemic acute tubular necrosis with concomitant use of lisinopril/hydrochlorothiazide and metformin.  M spike was negative.  Renal ultrasound was negative for hydronephrosis.  Patient was treated with IV fluids.  Subsequently creatinine function has been improving slowly.  Patient was clear for discharge by nephrology.  She has been waiting for insurance approval to transfer to skilled nursing facility.  She has been confused this is likely related to hospital delirium.  Suspect this will improve over time.    Assessment & Plan:   Principal Problem:   AKI (acute kidney injury) ([place]) Active Problems:   HYPERTENSION, MILD   CKD (chronic kidney disease) stage 3, GFR 30-59 ml/min (HCC)   Hyperkalemia   Metabolic acidosis, increased anion gap   Normocytic anemia   Diabetes mellitus type II, non insulin dependent (HCC)   Hypocalcemia   FTT (failure to thrive) in adult   Acute renal failure with acute tubular necrosis superimposed on stage 3 chronic kidney disease ([place])   Palliative care encounter   1-Acute on chronic kidney disease a stage III in the setting of probably ischemic ATN in the setting of dehydration and lisinopril/hydrochlorothiazide and metformin use. She was treated with IV fluids. Per nephrology okay to discharge patient with close follow-up.  Patient will need to b-met in 3 to 4 days. Per Dr [employer] with nephrology, patient will  need renal function in 3 to 4 days after  discharge, results can be called to [place] [facility] and associated  to Dr. Justin Mend attention. Follow up with nephrology in 2-4 weeks.  Cr continue to decrease today at 6.1----6.4------7.3----7.7-------7.8----8 Volume status improve, weight down to 118---form 130  She has episode of incontinence.  Patient was clear for discharge by nephrology last week.   2-Generalized edema, bilateral arm edema. Venous Doppler lower extremity negative for DVT. Improving. Weight trending down 130---118 Patient with less generalized edema.   3-Hyperkalemia on admission.  Related to AKI and ace inhibitor.  Resolved.  4-normocytic anemia: B12 low at 117.  She has received intramuscular supplements.  Continue with oral supplements.  Monitor hb.   Hypertension: Hydrochlorothiazide and lisinopril have been discontinued due to AKI. Started on Norvasc. BP stable.   Diabetes: Continue with a sliding scale insulin.  Hyperlipidemia: Continue with pravastatin.  Acute confusional state: Seroquel at bedtime as needed.  Haldol IV as needed.  Last dose of 12/29.  She had an episode of confusion 3-13 with some hallucination. She is alert and oriented today 3-14  Suspect delirium. MRI negative for acute stroke. Ammonia level normal.  Stable for discharge.  I expect confusion will improved over weeks.   Lactic acidosis in the setting of renal failure.  Increase bicarb on 3-13. Gap close, bicarb at 21  Hypoalbuminemia; started protein supplement.   Constipation; dulcolax suppository.   Estimated body mass index is 20.28 kg/m as calculated from the following:   Height as of this encounter: '5\' 4"'  (1.626 m).   Weight as of this encounter: 53.6 kg.   DVT prophylaxis: Heparin Code Status: Partial code Family Communication: Care discussed with patient Disposition Plan: I  discussed case with insurance physician, they declined the case because they thought that patient was still too acutely for skilled nursing facility.  They are recommending to repeat lab work on Monday and resubmit  claim Awaiting response form insurance. Patient is medical stable to be transfer to SNF>   Consultants:  = Nephrology Palliative care consultation CIR   Procedures:   Pack of red blood cell transfusion  Antimicrobials: None  Subjective: She is alert, she wants to be discharge. Hallucination at times.  No BM>   Objective: Vitals:   08/27/18 1626 08/27/18 2112 08/28/18 0330 08/28/18 0833  BP: (!) 135/48 (!) 144/56 (!) 139/52 (!) 146/64  Pulse: 65 79 72 66  Resp: 18 19 (!) 21 18  Temp: 97.7 F (36.5 C) 98.1 F (36.7 C) 98.1 F (36.7 C) 97.6 F (36.4 C)  TempSrc: Oral Oral Oral Oral  SpO2: 99% 99% 98% 97%  Weight:      Height:        Intake/Output Summary (Last 24 hours) at 08/28/2018 1453 Last data filed at 08/28/2018 1300 Gross per 24 hour  Intake 1140 ml  Output 500 ml  Net 640 ml   Filed Weights   08/25/18 2035 08/26/18 2147 08/27/18 0500  Weight: 53.6 kg 53.6 kg 53.6 kg    Examination:  General exam: NAD Respiratory system: CTA Cardiovascular system: S 1, S 2 RRR Gastrointestinal system: BS present, soft, nt Central nervous system: Non focal, consfuse Extremities: symmetric power, less LE edema,  Data Reviewed: I have personally reviewed following labs and imaging studies  CBC: Recent Labs  Lab 08/23/18 1509 08/26/18 0252 08/28/18 0522  WBC 6.4 5.6 6.6  HGB 9.6* 8.9* 8.8*  HCT 29.4* 27.9* 27.2*  MCV 90.2 88.6 89.2  PLT 235 216 947   Basic Metabolic Panel: Recent Labs  Lab 08/24/18 0334 08/25/18 0542 08/26/18 0252 08/27/18 0732 08/28/18 0522  NA 139 137 137 137 135  K 4.1 4.0 3.6 3.8 3.8  CL 100 103 102 105 102  CO2 21* 21* 21* 20* 23  GLUCOSE 123* 138* 123* 153* 168*  BUN 60* 60* 60* 62* 65*  CREATININE 7.82* 7.79* 7.35* 6.46* 6.15*  CALCIUM 10.7* 9.5 9.1 9.2 9.4  PHOS 3.8 3.5 3.7 2.8 4.0   GFR: Estimated Creatinine Clearance: 6.9 mL/min (A) (by C-G formula based on SCr of 6.15 mg/dL (H)). Liver Function Tests: Recent Labs    Lab 08/24/18 0334 08/25/18 0542 08/26/18 0252 08/27/18 0732 08/28/18 0522  ALBUMIN 2.6* 2.6* 2.7* 2.8* 2.7*   No results for input(s): LIPASE, AMYLASE in the last 168 hours. Recent Labs  Lab 08/27/18 0953  AMMONIA 17   Coagulation Profile: No results for input(s): INR, PROTIME in the last 168 hours. Cardiac Enzymes: No results for input(s): CKTOTAL, CKMB, CKMBINDEX, TROPONINI in the last 168 hours. BNP (last 3 results) No results for input(s): PROBNP in the last 8760 hours. HbA1C: No results for input(s): HGBA1C in the last 72 hours. CBG: Recent Labs  Lab 08/27/18 1136 08/27/18 1628 08/27/18 2114 08/28/18 0747 08/28/18 1105  GLUCAP 123* 275* 221* 151* 114*   Lipid Profile: No results for input(s): CHOL, HDL, LDLCALC, TRIG, CHOLHDL, LDLDIRECT in the last 72 hours. Thyroid Function Tests: No results for input(s): TSH, T4TOTAL, FREET4, T3FREE, THYROIDAB in the last 72 hours. Anemia Panel: No results for input(s): VITAMINB12, FOLATE, FERRITIN, TIBC, IRON, RETICCTPCT in the last 72 hours. Sepsis Labs: No results for input(s): PROCALCITON, LATICACIDVEN in the last 168 hours.  No  results found for this or any previous visit (from the past 240 hour(s)).       Radiology Studies: Mr Brain Wo Contrast  Result Date: 08/27/2018 CLINICAL DATA:  Altered level of consciousness. EXAM: MRI HEAD WITHOUT CONTRAST TECHNIQUE: Multiplanar, multiecho pulse sequences of the brain and surrounding structures were obtained without intravenous contrast. COMPARISON:  None. FINDINGS: Brain: There is no evidence of acute infarct, mass, midline shift, or extra-axial fluid collection. Chronic microhemorrhages are noted in the left and possibly right cerebellar hemispheres. There are chronic lacunar infarcts in the right basal ganglia with a small amount of associated chronic blood products. Chronic lacunar infarcts are also noted in the right corona radiata and left cerebellum. Patchy to confluent  T2 hyperintensities throughout the cerebral white matter bilaterally are nonspecific but compatible with moderate to severe chronic small vessel ischemic disease. There is moderate cerebral atrophy. Vascular: Major intracranial vascular flow voids are preserved. Skull and upper cervical spine: Unremarkable bone marrow signal. Sinuses/Orbits: Unremarkable orbits. Clear paranasal sinuses. Large right and small left mastoid effusions. Right middle ear effusion. Other: None. IMPRESSION: 1. No acute intracranial abnormality. 2. Moderate to severe chronic small vessel ischemic disease with chronic lacunar infarcts as above. 3. Right larger than left mastoid and right middle ear effusions. Electronically Signed   By: Logan Bores M.D.   On: 08/27/2018 11:47        Scheduled Meds:  sodium chloride   Intravenous Once   amLODipine  10 mg Oral Daily   aspirin EC  81 mg Oral Daily   bisacodyl  10 mg Rectal Once   calcitRIOL  0.25 mcg Oral Daily   calcium acetate  1,334 mg Oral TID WC   feeding supplement (NEPRO CARB STEADY)  237 mL Oral BID BM   ferrous sulfate  325 mg Oral TID WC   heparin  5,000 Units Subcutaneous Q8H   insulin aspart  0-5 Units Subcutaneous QHS   insulin aspart  0-9 Units Subcutaneous TID WC   nicotine  21 mg Transdermal Daily   pantoprazole  40 mg Oral Daily   pravastatin  10 mg Oral q1800   sodium bicarbonate  1,300 mg Oral BID   sodium chloride flush  3 mL Intravenous Q12H   vitamin B-12  1,000 mcg Oral Daily   Continuous Infusions:  sodium chloride Stopped (08/09/18 2346)     LOS: 19 days    Time spent: 35 minutes.     Elmarie Shiley, MD [facility] Pager [phone]  If 7PM-7AM, please contact night-coverage [email or web address] Password [facility] 08/28/2018, 2:53 PM

## 2018-08-28 NOTE — Progress Notes (Signed)
Pt. Complained of constipation. MD regalado made aware. Orders were given. Dulcolax suppository ordered. Pt was able to have bowel movement.  Britta Mccreedy RN

## 2018-08-28 NOTE — Progress Notes (Signed)
Physical Therapy Treatment Patient Details Name: Sandra Sheppard MRN: [id number] DOB: August 17, 1945 Today's Date: 08/28/2018    History of Present Illness Pt is a 73 y/o F with significant PMH of hypertension, type 2 diabetes mellitus, and chronic kidney disease stage III now presenting for evaluation of abnormal outpatient blood work and found to have an acute kidney injury.    PT Comments    Pt appears to be more confused today, and very angry about still being in the hospital when PT arrived. See Cognition Section for more detail. Pt continues to demonstrate decreased tolerance for functional activity, decreased strength, and increased risk for falls. SNF remains the most appropriate d/c disposition at this time.    Follow Up Recommendations  SNF;Supervision/Assistance - 24 hour     Equipment Recommendations  Rolling walker with 5" wheels    Recommendations for Other Services       Precautions / Restrictions Precautions Precautions: Fall Restrictions Weight Bearing Restrictions: No    Mobility  Bed Mobility Overal bed mobility: Needs Assistance Bed Mobility: Supine to Sit     Supine to sit: Min assist;Mod assist     General bed mobility comments: pt received in chair  Transfers Overall transfer level: Needs assistance Equipment used: Rolling walker (2 wheeled) Transfers: Sit to/from [facility] Sit to Stand: Min guard         General transfer comment: VC's for hand placement on seated surface for safety. Hands-on guarding required for safety for power-up to full stand.   Ambulation/Gait Ambulation/Gait assistance: Min assist Gait Distance (Feet): 175 Feet Assistive device: Rolling walker (2 wheeled) Gait Pattern/deviations: Step-through pattern;Decreased stride length;Drifts right/left Gait velocity: decreased Gait velocity interpretation: <1.8 ft/sec, indicate of risk for recurrent falls General Gait Details: VC's for improved posture and  closer walker proximity. Pt required occasional assistance for walker management, and took 3 standing rest breaks due to fatigue.   Stairs             Wheelchair Mobility    Modified Rankin (Stroke Patients Only)       Balance Overall balance assessment: Needs assistance Sitting-balance support: Feet supported Sitting balance-Leahy Scale: Good     Standing balance support: Bilateral upper extremity supported;During functional activity Standing balance-Leahy Scale: Fair Standing balance comment: no UE for short duration static standing;intermittent Single UE support for longer duration standing and dynamic standing balane                            Cognition Arousal/Alertness: Awake/alert Behavior During Therapy: WFL for tasks assessed/performed Overall Cognitive Status: Impaired/Different from baseline Area of Impairment: Problem solving;Following commands;Orientation;Memory;Safety/judgement;Awareness                 Orientation Level: Disoriented to;Situation   Memory: Decreased short-term memory Following Commands: Follows one step commands with increased time;Follows multi-step commands inconsistently Safety/Judgement: Decreased awareness of safety;Decreased awareness of deficits Awareness: Emergent Problem Solving: Slow processing;Requires verbal cues;Decreased initiation General Comments: Pt appears more confused today. She reports she doesn't know why she was admitted here since all she has is fluid in the ear. Pt reports the doctors are trying to keep her here for money and the nurses are withholding her medication for constipation. Pt also states there were people smoking outside of her room all night long which has now made her want a cigarette.       Exercises      General Comments  Pertinent Vitals/Pain Pain Assessment: No/denies pain Faces Pain Scale: No hurt Pain Intervention(s): Monitored during session    Home Living                       Prior Function            PT Goals (current goals can now be found in the care plan section) Acute Rehab PT Goals Patient Stated Goal: Have a bowel movement; go home. PT Goal Formulation: With patient Time For Goal Achievement: 09/08/18 Potential to Achieve Goals: Good Progress towards PT goals: Progressing toward goals    Frequency    Min 2X/week      PT Plan Current plan remains appropriate    Co-evaluation              AM-PAC PT "6 Clicks" Mobility   Outcome Measure  Help needed turning from your back to your side while in a flat bed without using bedrails?: A Little Help needed moving from lying on your back to sitting on the side of a flat bed without using bedrails?: A Little Help needed moving to and from a bed to a chair (including a wheelchair)?: A Little Help needed standing up from a chair using your arms (e.g., wheelchair or bedside chair)?: A Little Help needed to walk in hospital room?: A Little Help needed climbing 3-5 steps with a railing? : A Lot 6 Click Score: 17    End of Session Equipment Utilized During Treatment: Gait belt Activity Tolerance: Patient tolerated treatment well;Patient limited by fatigue Patient left: in chair;with call bell/phone within reach(OT present in room ) Nurse Communication: Mobility status PT Visit Diagnosis: Unsteadiness on feet (R26.81);Muscle weakness (generalized) (M62.81);Difficulty in walking, not elsewhere classified (R26.2)     Time: 1975-8832 PT Time Calculation (min) (ACUTE ONLY): 35 min  Charges:  $Gait Training: 23-37 mins                     Rolinda Roan, PT, DPT Acute Rehabilitation Services Pager: [phone] Office: [phone]    [name] 08/28/2018, 12:35 PM

## 2018-08-28 NOTE — Progress Notes (Signed)
Inpatient Diabetes Program Recommendations  AACE/ADA: New Consensus Statement on Inpatient Glycemic Control   Target Ranges:  Prepandial:   less than 140 mg/dL      Peak postprandial:   less than 180 mg/dL (1-2 hours)      Critically ill patients:  140 - 180 mg/dL   Results for TARSHIA, KOT (MRN [id number]) as of 08/28/2018 08:28  Ref. Range 08/27/2018 07:24 08/27/2018 11:36 08/27/2018 16:28 08/27/2018 21:14 08/28/2018 07:47  Glucose-Capillary Latest Ref Range: 70 - 99 mg/dL 146 (H) 123 (H) 275 (H) 221 (H) 151 (H)   Review of Glycemic Control  Diabetes history: DM2 Outpatient Diabetes medications: Metformin XR 500 mg BID Current orders for Inpatient glycemic control: Novolog 0-9 units TID with meals, Novolog 0-5 units QHS  Inpatient Diabetes Program Recommendations:  Insulin - Meal Coverage: Please consider ordering Novolog 3 units TID with meals for meal coverage if patient eats at least 50% of meals.  Thanks, Barnie Alderman, RN, MSN, CDE Diabetes Coordinator Inpatient Diabetes Program [phone] (Team Pager from 8am to 5pm)

## 2018-08-28 NOTE — NC FL2 (Signed)
[place] LEVEL OF CARE SCREENING TOOL     IDENTIFICATION  Patient Name: Sandra Sheppard Birthdate: 1945-07-03 Sex: female Admission Date (Current Location): 08/08/2018  [facility] and [place] Number:  Herbalist and Address:  [facility], [place] [address], [place], [place] [address]      Provider Number: [id number]  Attending Physician Name and Address:  Elmarie Shiley, MD  Relative Name and Phone Number:  [name] [phone]    Current Level of Care: Hospital Recommended Level of Care: [place] Prior Approval Number:    Date Approved/Denied:   PASRR Number: [id number]  Discharge Plan: SNF    Current Diagnoses: Patient Active Problem List   Diagnosis Date Noted  . Acute renal failure with acute tubular necrosis superimposed on stage 3 chronic kidney disease ([place])   . Palliative care encounter   . Hypocalcemia   . FTT (failure to thrive) in adult   . AKI (acute kidney injury) ([place]) 08/08/2018  . Hyperkalemia 08/08/2018  . Metabolic acidosis, increased anion gap 08/08/2018  . Normocytic anemia 08/08/2018  . Diabetes mellitus type II, non insulin dependent ([place]) 08/08/2018  . Bilateral carotid artery disease ([place]) 07/12/2018  . Dyslipidemia 07/12/2018  . Vomiting 07/04/2016  . Gastroenteritis 07/04/2016  . CKD (chronic kidney disease) stage 3, GFR 30-59 ml/min (HCC) 07/04/2016  . Occlusion and stenosis of carotid artery 04/28/2010  . ABDOMINAL AORTIC ANEURYSM 04/28/2010  . Peripheral vascular disease ([place]) 12/08/2008  . STENOSIS, MITRAL AND AORTIC VALVES 10/07/2008  . FIBRILLATION, ATRIAL 10/07/2008  . H/O aortic valve replacement 08/14/2008  . HYPERCHOLESTEROLEMIA  IIA 06/23/2008  . HYPERTENSION, MILD 06/23/2008  . MURMUR 06/23/2008    Orientation RESPIRATION BLADDER Height & Weight     Self, Time, Situation, Place  Normal Continent Weight: 53.6 kg Height:  5\' 4"  (162.6 cm)  BEHAVIORAL  SYMPTOMS/MOOD NEUROLOGICAL BOWEL NUTRITION STATUS      Continent Diet  AMBULATORY STATUS COMMUNICATION OF NEEDS Skin   Limited Assist Verbally Bruising, Other (Comment)                       Personal Care Assistance Level of Assistance  Bathing, Feeding, Dressing Bathing Assistance: Limited assistance Feeding assistance: Limited assistance Dressing Assistance: Limited assistance     Functional Limitations Info  Sight, Hearing, Speech Sight Info: Adequate Hearing Info: Adequate Speech Info: Adequate    SPECIAL CARE FACTORS FREQUENCY  PT (By licensed PT), OT (By licensed OT)     PT Frequency: evaluated in hospital 2/28 with updates 3/17- PT at SNF to eval and treat OT Frequency: evaluated in hospital 2/28 with updates 3/17- OT at SNF to eval and treat            Contractures Contractures Info: Not present    Additional Factors Info  Code Status, Allergies Code Status Info: no intubation/vent Allergies Info: codeine (rash, nausea/vomiting), sulfa, penicillin (rash)           Current Medications (08/28/2018):  This is the current hospital active medication list Current Facility-Administered Medications  Medication Dose Route Frequency Provider Last Rate Last Dose  . 0.9 %  sodium chloride infusion (Manually program via Guardrails IV Fluids)   Intravenous Once Florencia Reasons, MD      . 0.9 %  sodium chloride infusion   Intravenous PRN Norval Morton, MD   Stopped at 08/09/18 2346  . acetaminophen (TYLENOL) tablet 650 mg  650 mg Oral  Q6H PRN Vianne Bulls, MD   650 mg at 08/14/18 1844   Or  . acetaminophen (TYLENOL) suppository 650 mg  650 mg Rectal Q6H PRN Opyd, Ilene Qua, MD      . amLODipine (NORVASC) tablet 10 mg  10 mg Oral Daily Tamala Julian, Rondell A, MD   10 mg at 08/28/18 1022  . aspirin EC tablet 81 mg  81 mg Oral Daily Opyd, Ilene Qua, MD   81 mg at 08/28/18 1023  . calcitRIOL (ROCALTROL) capsule 0.25 mcg  0.25 mcg Oral Daily Rosita Fire, MD   0.25 mcg at  08/28/18 1023  . calcium acetate (PHOSLO) capsule 1,334 mg  1,334 mg Oral TID WC Rosita Fire, MD   1,334 mg at 08/28/18 1202  . docusate sodium (COLACE) capsule 100 mg  100 mg Oral Daily PRN Arby Barrette A, NP   100 mg at 08/26/18 2201  . feeding supplement (NEPRO CARB STEADY) liquid 237 mL  237 mL Oral BID BM Regalado, Belkys A, MD   237 mL at 08/28/18 1027  . ferrous sulfate tablet 325 mg  325 mg Oral TID WC Charlynne Cousins, MD   325 mg at 08/28/18 1202  . haloperidol lactate (HALDOL) injection 1 mg  1 mg Intravenous Q6H PRN Charlynne Cousins, MD      . heparin injection 5,000 Units  5,000 Units Subcutaneous Q8H Vianne Bulls, MD   5,000 Units at 08/28/18 0553  . insulin aspart (novoLOG) injection 0-5 Units  0-5 Units Subcutaneous QHS Vianne Bulls, MD   2 Units at 08/27/18 2121  . insulin aspart (novoLOG) injection 0-9 Units  0-9 Units Subcutaneous TID WC Opyd, Ilene Qua, MD   2 Units at 08/28/18 0827  . nicotine (NICODERM CQ - dosed in mg/24 hours) patch 21 mg  21 mg Transdermal Daily Charlynne Cousins, MD   21 mg at 08/28/18 1022  . ondansetron (ZOFRAN) tablet 4 mg  4 mg Oral Q6H PRN Opyd, Ilene Qua, MD   4 mg at 08/09/18 1734   Or  . ondansetron (ZOFRAN) injection 4 mg  4 mg Intravenous Q6H PRN Opyd, Ilene Qua, MD   4 mg at 08/13/18 1205  . pantoprazole (PROTONIX) EC tablet 40 mg  40 mg Oral Daily Tamala Julian, Rondell A, MD   40 mg at 08/28/18 1023  . pravastatin (PRAVACHOL) tablet 10 mg  10 mg Oral q1800 Opyd, Ilene Qua, MD   10 mg at 08/27/18 1655  . sodium bicarbonate tablet 1,300 mg  1,300 mg Oral BID Regalado, Belkys A, MD   1,300 mg at 08/28/18 1023  . sodium chloride flush (NS) 0.9 % injection 3 mL  3 mL Intravenous Q12H Opyd, Ilene Qua, MD   3 mL at 08/28/18 1028  . vitamin B-12 (CYANOCOBALAMIN) tablet 1,000 mcg  1,000 mcg Oral Daily Regalado, Belkys A, MD   1,000 mcg at 08/28/18 1023     Discharge Medications: Please see discharge summary for a list of  discharge medications.  Relevant Imaging Results:  Relevant Lab Results:   Additional Information SSN [id number]  Bartholomew Crews, RN

## 2018-08-29 LAB — RENAL FUNCTION PANEL
Albumin: 2.8 g/dL — ABNORMAL LOW (ref 3.5–5.0)
Anion gap: 11 (ref 5–15)
BUN: 68 mg/dL — ABNORMAL HIGH (ref 8–23)
CO2: 27 mmol/L (ref 22–32)
Calcium: 10.1 mg/dL (ref 8.9–10.3)
Chloride: 100 mmol/L (ref 98–111)
Creatinine, Ser: 5.85 mg/dL — ABNORMAL HIGH (ref 0.44–1.00)
GFR calc Af Amer: 8 mL/min — ABNORMAL LOW (ref >60)
GFR calc non Af Amer: 7 mL/min — ABNORMAL LOW (ref >60)
Glucose, Bld: 162 mg/dL — ABNORMAL HIGH (ref 70–99)
Phosphorus: 3.5 mg/dL (ref 2.5–4.6)
Potassium: 3.8 mmol/L (ref 3.5–5.1)
Sodium: 138 mmol/L (ref 135–145)

## 2018-08-29 LAB — GLUCOSE, CAPILLARY
Glucose-Capillary: 167 mg/dL — ABNORMAL HIGH (ref 70–99)
Glucose-Capillary: 128 mg/dL — ABNORMAL HIGH (ref 70–99)
Glucose-Capillary: 124 mg/dL — ABNORMAL HIGH (ref 70–99)
Glucose-Capillary: 166 mg/dL — ABNORMAL HIGH (ref 70–99)

## 2018-08-29 NOTE — Progress Notes (Signed)
PROGRESS NOTE    Sandra Sheppard  GUR:[id number] DOB: 05-May-1946 DOA: 08/08/2018 PCP: Prince Solian, MD   Brief Narrative:  73 year old past medical history significant for hypertension, diabetes, stage III chronic kidney disease with a creatinine baseline 1.5-2.0 who presented with nausea vomiting and diarrhea.  She was found to be in acute acute on chronic renal failure with a creatinine at 12.  Nephrologist was consulted, working diagnosis is ischemic acute tubular necrosis with concomitant use of lisinopril/hydrochlorothiazide and metformin.  M spike was negative.  Renal ultrasound was negative for hydronephrosis.  Patient was treated with IV fluids.  Subsequently creatinine function has been improving slowly.  Patient was cleared for discharge by nephrology.  She has been waiting for insurance approval to transfer to skilled nursing facility.  She has been confused this is likely related to hospital delirium.  Suspect this will improve over time.    Assessment & Plan:   Principal Problem:   AKI (acute kidney injury) ([place]) Active Problems:   HYPERTENSION, MILD   CKD (chronic kidney disease) stage 3, GFR 30-59 ml/min (HCC)   Hyperkalemia   Metabolic acidosis, increased anion gap   Normocytic anemia   Diabetes mellitus type II, non insulin dependent (HCC)   Hypocalcemia   FTT (failure to thrive) in adult   Acute renal failure with acute tubular necrosis superimposed on stage 3 chronic kidney disease ([place])   Palliative care encounter   Acute on chronic kidney disease a stage III in the setting of probably ischemic ATN  - in the setting of dehydration and lisinopril/hydrochlorothiazide and metformin use. - She was treated with IV fluids. - Per Dr [employer] with nephrology, patient will  need renal function in 3 to 4 days after  discharge, results can be called to [facility] to Dr. Justin Mend attention. - Follow up with nephrology in 2-4 weeks.  -Creatinine progressively  improving and down to 5.85 today. -Improved and stable.  Generalized edema, bilateral arm edema. Venous Doppler upper and lower extremity negative for DVT. Improving. Weight trending down 130---118 Edema has improved.  Hyperkalemia Noted on admission.  Related to AKI and ace inhibitor.  Resolved.  Normocytic anemia:  B12 low at 117.  She has received intramuscular supplements.  Continue with oral supplements.  Monitor hb.  Recommend checking B12 levels in a couple of weeks.  Essential hypertension:  Hydrochlorothiazide and lisinopril have been discontinued due to AKI. Started on Norvasc. BP stable.   Diabetes:  Continue with a sliding scale insulin.  Reasonable inpatient control.  Hyperlipidemia:  Continue with pravastatin.  Acute confusional state:  Suspect delirium. MRI negative for acute stroke. Ammonia level normal.  Stable for discharge.  I expect confusion will improve over weeks.  Delirium precautions.  This could also have been in the context of acute kidney injury which progressively improves.  Lactic acidosis in the setting of renal failure.   Increase bicarb on 3-13. Gap close, bicarb at 21  Hypoalbuminemia;  started protein supplement.    DVT prophylaxis: Heparin Code Status: Partial code Family Communication: None at bedside today. Disposition Plan: Dr. Tyrell Antonio discussed case with insurance physician, they declined the case because they thought that patient was still too acutely for skilled nursing facility.  They are recommending to repeat lab work on Monday and resubmit claim Awaiting response form insurance. Patient is medical stable to be transfer to SNF>   Consultants:  Nephrology Palliative care consultation CIR   Procedures:   Pack of red blood cell transfusion  Antimicrobials: None  Subjective: Feels generally weak.  As per RN, not getting much out of bed.  Asking for bedpan.  Patient denies any specific complaints.  Objective:  Vitals:   08/28/18 2048 08/29/18 0416 08/29/18 0934 08/29/18 1630  BP: (!) 152/64 (!) 130/54 (!) 147/65 (!) 145/62  Pulse: 79 71 70 (!) 53  Resp: 16 16 18    Temp: 98.2 F (36.8 C) 98.6 F (37 C) 98 F (36.7 C) 98 F (36.7 C)  TempSrc: Oral Oral Oral Oral  SpO2: 95% 93% 97% 100%  Weight: 52.8 kg     Height:        Intake/Output Summary (Last 24 hours) at 08/29/2018 1721 Last data filed at 08/29/2018 1500 Gross per 24 hour  Intake 340 ml  Output 300 ml  Net 40 ml   Filed Weights   08/26/18 2147 08/27/18 0500 08/28/18 2048  Weight: 53.6 kg 53.6 kg 52.8 kg    Examination:  General exam: Pleasant elderly female, moderately built and frail lying comfortably propped up in bed.  Oral mucosa moist. Respiratory system: Clear to auscultation.  No increased work of breathing. Cardiovascular system: S 1, S 2 RRR.  No JVD, murmurs or pedal edema. Gastrointestinal system: BS present, soft, nt Central nervous system: Alert and oriented x2.  No focal neurological deficits. Extremities: Symmetric 5 x 5 power.  Data Reviewed: I have personally reviewed following labs and imaging studies  CBC: Recent Labs  Lab 08/23/18 1509 08/26/18 0252 08/28/18 0522  WBC 6.4 5.6 6.6  HGB 9.6* 8.9* 8.8*  HCT 29.4* 27.9* 27.2*  MCV 90.2 88.6 89.2  PLT 235 216 562   Basic Metabolic Panel: Recent Labs  Lab 08/25/18 0542 08/26/18 0252 08/27/18 0732 08/28/18 0522 08/29/18 0549  NA 137 137 137 135 138  K 4.0 3.6 3.8 3.8 3.8  CL 103 102 105 102 100  CO2 21* 21* 20* 23 27  GLUCOSE 138* 123* 153* 168* 162*  BUN 60* 60* 62* 65* 68*  CREATININE 7.79* 7.35* 6.46* 6.15* 5.85*  CALCIUM 9.5 9.1 9.2 9.4 10.1  PHOS 3.5 3.7 2.8 4.0 3.5   GFR: Estimated Creatinine Clearance: 7.1 mL/min (A) (by C-G formula based on SCr of 5.85 mg/dL (H)). Liver Function Tests: Recent Labs  Lab 08/25/18 0542 08/26/18 0252 08/27/18 0732 08/28/18 0522 08/29/18 0549  ALBUMIN 2.6* 2.7* 2.8* 2.7* 2.8*    Recent  Labs  Lab 08/27/18 0953  AMMONIA 17   CBG: Recent Labs  Lab 08/28/18 1709 08/28/18 2054 08/29/18 0651 08/29/18 1127 08/29/18 1631  GLUCAP 209* 169* 167* 128* 124*      Radiology Studies: No results found.      Scheduled Meds: . sodium chloride   Intravenous Once  . amLODipine  10 mg Oral Daily  . aspirin EC  81 mg Oral Daily  . calcitRIOL  0.25 mcg Oral Daily  . calcium acetate  1,334 mg Oral TID WC  . feeding supplement (NEPRO CARB STEADY)  237 mL Oral BID BM  . ferrous sulfate  325 mg Oral TID WC  . heparin  5,000 Units Subcutaneous Q8H  . insulin aspart  0-5 Units Subcutaneous QHS  . insulin aspart  0-9 Units Subcutaneous TID WC  . nicotine  21 mg Transdermal Daily  . pantoprazole  40 mg Oral Daily  . pravastatin  10 mg Oral q1800  . sodium bicarbonate  1,300 mg Oral BID  . sodium chloride flush  3 mL Intravenous Q12H  . vitamin B-12  1,000 mcg Oral Daily   Continuous Infusions: . sodium chloride Stopped (08/09/18 2346)     LOS: 20 days    Vernell Leep, MD, FACP, [facility]. [facility]  To contact the attending provider between 7A-7P or the covering provider during after hours 7P-7A, please log into the web site [email or web address] and access using universal [place] password for that web site. If you do not have the password, please call the hospital operator.

## 2018-08-29 NOTE — Care Management Important Message (Signed)
Important Message  Patient Details  Name: Sandra Sheppard MRN: [id number] Date of Birth: 01/12/1946   Medicare Important Message Given:  Yes    Orbie Pyo 08/29/2018, 11:32 AM

## 2018-08-29 NOTE — TOC Progression Note (Signed)
Transition of Care [facility]) - Progression Note    Patient Details  Name: Sandra Sheppard MRN: [id number] Date of Birth: [birth year]  Transition of [facility]) CM/SW Contact  Bartholomew Crews, RN Phone Number: 08/29/2018, 12:21 PM  Clinical Narrative:    Spoke with patient and sister, [name], at bedside. Discussed authorization pending. Reinforced to patient that rehabilitation is needed to help build her strength and endurance in order to be successful when she does transition home after rehabilitation. Patient verbalized understanding.   Expected Discharge Plan: Skilled Nursing Facility Barriers to Discharge: No Barriers Identified  Expected Discharge Plan and Services Expected Discharge Plan: [place] Discharge Planning Services: NA Post Acute Care Choice: [place] Living arrangements for the past 2 months: Single Family Home Expected Discharge Date: 08/15/18               DME Arranged: N/A DME Agency: NA HH Arranged: NA HH Agency: NA   Social Determinants of Health (SDOH) Interventions    Readmission Risk Interventions 30 Day Unplanned Readmission Risk Score     ED to Hosp-Admission (Current) from 08/08/2018 in [facility]  30 Day Unplanned Readmission Risk Score (%)  30 Filed at 08/29/2018 1200     This score is the patient's risk of an unplanned readmission within 30 days of being discharged (0 -100%). The score is based on dignosis, age, lab data, medications, orders, and past utilization.   Low:  0-14.9   Medium: 15-21.9   High: 22-29.9   Extreme: 30 and above       No flowsheet data found.

## 2018-08-30 LAB — RENAL FUNCTION PANEL
Albumin: 2.8 g/dL — ABNORMAL LOW (ref 3.5–5.0)
Anion gap: 16 — ABNORMAL HIGH (ref 5–15)
BUN: 68 mg/dL — ABNORMAL HIGH (ref 8–23)
CO2: 24 mmol/L (ref 22–32)
Calcium: 9.8 mg/dL (ref 8.9–10.3)
Chloride: 98 mmol/L (ref 98–111)
Creatinine, Ser: 5.95 mg/dL — ABNORMAL HIGH (ref 0.44–1.00)
GFR calc Af Amer: 7 mL/min — ABNORMAL LOW (ref >60)
GFR calc non Af Amer: 6 mL/min — ABNORMAL LOW (ref >60)
Glucose, Bld: 156 mg/dL — ABNORMAL HIGH (ref 70–99)
Phosphorus: 3.4 mg/dL (ref 2.5–4.6)
Potassium: 3.6 mmol/L (ref 3.5–5.1)
Sodium: 138 mmol/L (ref 135–145)

## 2018-08-30 LAB — GLUCOSE, CAPILLARY
Glucose-Capillary: 159 mg/dL — ABNORMAL HIGH (ref 70–99)
Glucose-Capillary: 165 mg/dL — ABNORMAL HIGH (ref 70–99)
Glucose-Capillary: 125 mg/dL — ABNORMAL HIGH (ref 70–99)
Glucose-Capillary: 202 mg/dL — ABNORMAL HIGH (ref 70–99)
Glucose-Capillary: 165 mg/dL — ABNORMAL HIGH (ref 70–99)

## 2018-08-30 NOTE — Progress Notes (Signed)
PROGRESS NOTE    Sandra Sheppard  JSE:[id number] DOB: [birth year] DOA: 08/08/2018 PCP: Prince Solian, MD   Brief Narrative:  73 year old past medical history significant for hypertension, diabetes, stage III chronic kidney disease with a creatinine baseline 1.5-2.0 who presented with nausea vomiting and diarrhea.  She was found to be in acute acute on chronic renal failure with a creatinine at 12.  Nephrologist was consulted, working diagnosis is ischemic acute tubular necrosis with concomitant use of lisinopril/hydrochlorothiazide and metformin.  M spike was negative.  Renal ultrasound was negative for hydronephrosis.  Patient was treated with IV fluids.  Subsequently creatinine function has been improving slowly.  Patient was cleared for discharge by nephrology.  She has been waiting for insurance approval to transfer to skilled nursing facility.  She has been confused this is likely related to hospital delirium.  Suspect this will improve over time.    Assessment & Plan:   Principal Problem:   AKI (acute kidney injury) ([place]) Active Problems:   HYPERTENSION, MILD   CKD (chronic kidney disease) stage 3, GFR 30-59 ml/min (HCC)   Hyperkalemia   Metabolic acidosis, increased anion gap   Normocytic anemia   Diabetes mellitus type II, non insulin dependent (HCC)   Hypocalcemia   FTT (failure to thrive) in adult   Acute renal failure with acute tubular necrosis superimposed on stage 3 chronic kidney disease ([place])   Palliative care encounter   Acute on chronic kidney disease a stage III in the setting of probably ischemic ATN  - in the setting of dehydration and lisinopril/hydrochlorothiazide and metformin use. - She was treated with IV fluids. - Per Dr [employer] with nephrology, patient will  need renal function in 3 to 4 days after  discharge, results can be called to [facility] to Dr. Justin Mend attention. - Follow up with nephrology in 2-4 weeks.  -Creatinine progressively  improving.  Creatinine has plateaued in the last 24 hours, 5.8-5.9. -No change in management for now.  Follow BMP in a.m.  Generalized edema, bilateral arm edema. Venous Doppler upper and lower extremity negative for DVT. Clinically appears euvolemic.  Hyperkalemia Noted on admission.  Related to AKI and ace inhibitor.  Resolved.  Normocytic anemia:  B12 low at 117.  She has received intramuscular supplements.  Continue with oral supplements.  Monitor hb.  Recommend checking B12 levels in a couple of weeks.  Essential hypertension:  Hydrochlorothiazide and lisinopril have been discontinued due to AKI. Started on Norvasc. BP stable.   Diabetes:  Continue with a sliding scale insulin.  Reasonable inpatient control.  Hyperlipidemia:  Continue with pravastatin.  Acute confusional state:  Suspect delirium. MRI negative for acute stroke. Ammonia level normal.  Delirium precautions.   Baseline mental status not known.  Suspect due to acute metabolic encephalopathy complicating possible underlying dementia.  Lactic acidosis in the setting of renal failure.   Increase bicarb on 3-13. Gap close, bicarb at 21  Hypoalbuminemia;  started protein supplement.    DVT prophylaxis: Heparin Code Status: Partial code Family Communication: None at bedside today. Disposition Plan: Dr. Tyrell Antonio discussed case with insurance physician, they declined the case because they thought that patient was still too acutely for skilled nursing facility.  They are recommending to repeat lab work on Monday and resubmit claim Awaiting response form insurance. Patient is medical stable to be transfer to SNF> discussed with clinical social work, insurance remains pending.  Consultants:  Nephrology Palliative care consultation CIR   Procedures:   Pack  of red blood cell transfusion  Antimicrobials: None  Subjective: Patient denies complaints.  Appears slightly confused at times.  As per RN, patient was  up in chair for several hours yesterday and seemed oriented.  Objective: Vitals:   08/29/18 1630 08/29/18 2135 08/30/18 0453 08/30/18 0954  BP: (!) 145/62 (!) 145/53 (!) 154/55 (!) 136/54  Pulse: (!) 53 72 73 62  Resp:  16 17 16   Temp: 98 F (36.7 C) 98.7 F (37.1 C) 98.7 F (37.1 C) 98.2 F (36.8 C)  TempSrc: Oral   Oral  SpO2: 100% 95% 95% 95%  Weight:  52.9 kg    Height:        Intake/Output Summary (Last 24 hours) at 08/30/2018 1535 Last data filed at 08/30/2018 1417 Gross per 24 hour  Intake 550 ml  Output 200 ml  Net 350 ml   Filed Weights   08/27/18 0500 08/28/18 2048 08/29/18 2135  Weight: 53.6 kg 52.8 kg 52.9 kg    Examination: No significant change in clinical exam compared to yesterday.  General exam: Pleasant elderly female, moderately built and frail lying comfortably propped up in bed.  Oral mucosa moist. Respiratory system: Clear to auscultation.  No increased work of breathing. Cardiovascular system: S 1, S 2 RRR.  No JVD, murmurs or pedal edema. Gastrointestinal system: BS present, soft, nt Central nervous system: Alert and oriented x2.  No focal neurological deficits. Extremities: Symmetric 5 x 5 power.  Data Reviewed: I have personally reviewed following labs and imaging studies  CBC: Recent Labs  Lab 08/26/18 0252 08/28/18 0522  WBC 5.6 6.6  HGB 8.9* 8.8*  HCT 27.9* 27.2*  MCV 88.6 89.2  PLT 216 409   Basic Metabolic Panel: Recent Labs  Lab 08/26/18 0252 08/27/18 0732 08/28/18 0522 08/29/18 0549 08/30/18 0448  NA 137 137 135 138 138  K 3.6 3.8 3.8 3.8 3.6  CL 102 105 102 100 98  CO2 21* 20* 23 27 24   GLUCOSE 123* 153* 168* 162* 156*  BUN 60* 62* 65* 68* 68*  CREATININE 7.35* 6.46* 6.15* 5.85* 5.95*  CALCIUM 9.1 9.2 9.4 10.1 9.8  PHOS 3.7 2.8 4.0 3.5 3.4   GFR: Estimated Creatinine Clearance: 7 mL/min (A) (by C-G formula based on SCr of 5.95 mg/dL (H)). Liver Function Tests: Recent Labs  Lab 08/26/18 0252 08/27/18 0732  08/28/18 0522 08/29/18 0549 08/30/18 0448  ALBUMIN 2.7* 2.8* 2.7* 2.8* 2.8*    Recent Labs  Lab 08/27/18 0953  AMMONIA 17   CBG: Recent Labs  Lab 08/29/18 1127 08/29/18 1631 08/29/18 2135 08/30/18 0651 08/30/18 1147  GLUCAP 128* 124* 166* 159* 165*      Radiology Studies: No results found.      Scheduled Meds: . amLODipine  10 mg Oral Daily  . aspirin EC  81 mg Oral Daily  . calcium acetate  1,334 mg Oral TID WC  . feeding supplement (NEPRO CARB STEADY)  237 mL Oral BID BM  . ferrous sulfate  325 mg Oral TID WC  . heparin  5,000 Units Subcutaneous Q8H  . insulin aspart  0-5 Units Subcutaneous QHS  . insulin aspart  0-9 Units Subcutaneous TID WC  . nicotine  21 mg Transdermal Daily  . pantoprazole  40 mg Oral Daily  . pravastatin  10 mg Oral q1800  . sodium bicarbonate  1,300 mg Oral BID  . sodium chloride flush  3 mL Intravenous Q12H  . vitamin B-12  1,000 mcg Oral Daily  Continuous Infusions: . sodium chloride Stopped (08/09/18 2346)     LOS: 21 days    Vernell Leep, MD, FACP, [facility]. [facility]  To contact the attending provider between 7A-7P or the covering provider during after hours 7P-7A, please log into the web site [email or web address] and access using universal [place] password for that web site. If you do not have the password, please call the hospital operator.

## 2018-08-30 NOTE — Progress Notes (Signed)
Occupational Therapy Treatment Patient Details Name: Sandra Sheppard MRN: [id number] DOB: [birth year] Today's Date: 08/30/2018    History of present illness Pt is a 73 y/o F with significant PMH of hypertension, type 2 diabetes mellitus, and chronic kidney disease stage III now presenting for evaluation of abnormal outpatient blood work and found to have an acute kidney injury.   OT comments  Pt progressing towards acute OT goals. Requesting to walk in the halls upon OT arrival. [place] household distance before becoming a bit incontinent of urine and requesting to return to room. Pt c/o discomfort associated with constipation and internally distracted by this. Mod A needed for toilet transfers this session. D/c plan remains appropriate.   Follow Up Recommendations  SNF;Supervision/Assistance - 24 hour    Equipment Recommendations  Tub/shower bench    Recommendations for Other Services      Precautions / Restrictions Precautions Precautions: Fall Restrictions Weight Bearing Restrictions: No       Mobility Bed Mobility Overal bed mobility: Needs Assistance Bed Mobility: Supine to Sit;Sit to Supine     Supine to sit: Min assist;HOB elevated     General bed mobility comments: extra time to initiate, assist to steady. extra time and effort  Transfers Overall transfer level: Needs assistance Equipment used: Rolling walker (2 wheeled) Transfers: Sit to/from Stand Sit to Stand: Min assist;Mod assist         General transfer comment: assist to powerup to standing. cues for hand placement. assist to stabilize rw with pt using it to pull up to stnadin    Balance Overall balance assessment: Needs assistance Sitting-balance support: Feet supported Sitting balance-Leahy Scale: Good     Standing balance support: Bilateral upper extremity supported;During functional activity Standing balance-Leahy Scale: Fair Standing balance comment: no UE for short duration static  standing;intermittent Single UE support for longer duration standing and dynamic standing balane                           ADL either performed or assessed with clinical judgement   ADL Overall ADL's : Needs assistance/impaired                         Toilet Transfer: Ambulation;RW;Moderate assistance Toilet Transfer Details (indicate cue type and reason): mod A to powerup from 3n1, not following cues for hand placement so assist to stabilize rw needed Toileting- Clothing Manipulation and Hygiene: Sit to/from stand;Min guard       Functional mobility during ADLs: Rolling walker;Min guard;Minimal assistance General ADL Comments: Pt requesting to walk in the halls. Pt with incontenince and increased speed to return to room.      Vision       Perception     Praxis      Cognition Arousal/Alertness: Awake/alert Behavior During Therapy: Flat affect Overall Cognitive Status: Impaired/Different from baseline Area of Impairment: Problem solving;Following commands;Orientation;Memory;Safety/judgement;Awareness                 Orientation Level: Disoriented to;Situation   Memory: Decreased short-term memory Following Commands: Follows one step commands with increased time;Follows multi-step commands inconsistently Safety/Judgement: Decreased awareness of safety;Decreased awareness of deficits Awareness: Emergent Problem Solving: Slow processing;Requires verbal cues;Decreased initiation General Comments: Difficulty following cues for hand placement with rw during transfers.         Exercises     Shoulder Instructions       General Comments  Pertinent Vitals/ Pain       Pain Assessment: Faces Faces Pain Scale: Hurts even more Pain Location: rectum with attempt to have bowel movement Pain Descriptors / Indicators: Discomfort Pain Intervention(s): Monitored during session;Other (comment)(notified nursing)  Home Living                                           Prior Functioning/Environment              Frequency  Min 2X/week        Progress Toward Goals  OT Goals(current goals can now be found in the care plan section)  Progress towards OT goals: Progressing toward goals  Acute Rehab OT Goals Patient Stated Goal: Have a bowel movement; go home. OT Goal Formulation: With patient Time For Goal Achievement: 09/03/18 Potential to Achieve Goals: Good ADL Goals Pt Will Perform Grooming: with supervision;with set-up;with modified independence;standing Pt Will Perform Upper Body Bathing: with set-up;with modified independence Pt Will Perform Lower Body Bathing: with min assist;with min guard assist;sit to/from stand Pt Will Perform Upper Body Dressing: with set-up;with modified independence Pt Will Perform Lower Body Dressing: with min assist;with min guard assist;sit to/from stand Pt Will Transfer to Toilet: with supervision;with modified independence;ambulating Pt Will Perform Toileting - Clothing Manipulation and hygiene: with min guard assist;with supervision;sit to/from stand Pt Will Perform Tub/Shower Transfer: with supervision;with modified independence;tub bench;rolling walker;ambulating  Plan Discharge plan remains appropriate    Co-evaluation                 AM-PAC OT "6 Clicks" Daily Activity     Outcome Measure   Help from another person eating meals?: None Help from another person taking care of personal grooming?: A Little Help from another person toileting, which includes using toliet, bedpan, or urinal?: A Little Help from another person bathing (including washing, rinsing, drying)?: A Little Help from another person to put on and taking off regular upper body clothing?: None Help from another person to put on and taking off regular lower body clothing?: A Little 6 Click Score: 20    End of Session Equipment Utilized During Treatment: Gait belt;Rolling walker  OT Visit  Diagnosis: Other abnormalities of gait and mobility (R26.89);Muscle weakness (generalized) (M62.81);Other symptoms and signs involving cognitive function   Activity Tolerance Patient tolerated treatment well   Patient Left in chair;with call bell/phone within reach;with chair alarm set   Nurse Communication (constipation, painful to attempt bowel movement)        Time: 1106-1130 OT Time Calculation (min): 24 min  Charges: OT General Charges $OT Visit: 1 Visit OT Treatments $Self Care/Home Management : 23-37 mins  Tyrone Schimke, OT Acute Rehabilitation Services Pager: [phone] Office: [phone]    Hortencia Pilar 08/30/2018, 11:52 AM

## 2018-08-31 DIAGNOSIS — E1122 Type 2 diabetes mellitus with diabetic chronic kidney disease: Secondary | ICD-10-CM | POA: Diagnosis not present

## 2018-08-31 DIAGNOSIS — Z7401 Bed confinement status: Secondary | ICD-10-CM | POA: Diagnosis not present

## 2018-08-31 DIAGNOSIS — N183 Chronic kidney disease, stage 3 (moderate): Secondary | ICD-10-CM | POA: Diagnosis not present

## 2018-08-31 DIAGNOSIS — E46 Unspecified protein-calorie malnutrition: Secondary | ICD-10-CM | POA: Diagnosis not present

## 2018-08-31 DIAGNOSIS — D519 Vitamin B12 deficiency anemia, unspecified: Secondary | ICD-10-CM | POA: Diagnosis not present

## 2018-08-31 DIAGNOSIS — E785 Hyperlipidemia, unspecified: Secondary | ICD-10-CM | POA: Diagnosis not present

## 2018-08-31 DIAGNOSIS — N179 Acute kidney failure, unspecified: Secondary | ICD-10-CM | POA: Diagnosis not present

## 2018-08-31 DIAGNOSIS — M6281 Muscle weakness (generalized): Secondary | ICD-10-CM | POA: Diagnosis not present

## 2018-08-31 DIAGNOSIS — M2550 Pain in unspecified joint: Secondary | ICD-10-CM | POA: Diagnosis not present

## 2018-08-31 DIAGNOSIS — R262 Difficulty in walking, not elsewhere classified: Secondary | ICD-10-CM | POA: Diagnosis not present

## 2018-08-31 DIAGNOSIS — R5381 Other malaise: Secondary | ICD-10-CM | POA: Diagnosis not present

## 2018-08-31 DIAGNOSIS — E119 Type 2 diabetes mellitus without complications: Secondary | ICD-10-CM | POA: Diagnosis not present

## 2018-08-31 DIAGNOSIS — D649 Anemia, unspecified: Secondary | ICD-10-CM | POA: Diagnosis not present

## 2018-08-31 DIAGNOSIS — I10 Essential (primary) hypertension: Secondary | ICD-10-CM | POA: Diagnosis not present

## 2018-08-31 DIAGNOSIS — N170 Acute kidney failure with tubular necrosis: Secondary | ICD-10-CM | POA: Diagnosis not present

## 2018-08-31 DIAGNOSIS — E875 Hyperkalemia: Secondary | ICD-10-CM | POA: Diagnosis not present

## 2018-08-31 DIAGNOSIS — E559 Vitamin D deficiency, unspecified: Secondary | ICD-10-CM | POA: Diagnosis not present

## 2018-08-31 LAB — RENAL FUNCTION PANEL
Albumin: 2.9 g/dL — ABNORMAL LOW (ref 3.5–5.0)
Anion gap: 13 (ref 5–15)
BUN: 67 mg/dL — ABNORMAL HIGH (ref 8–23)
CO2: 24 mmol/L (ref 22–32)
Calcium: 9.6 mg/dL (ref 8.9–10.3)
Chloride: 101 mmol/L (ref 98–111)
Creatinine, Ser: 5.33 mg/dL — ABNORMAL HIGH (ref 0.44–1.00)
GFR calc Af Amer: 9 mL/min — ABNORMAL LOW (ref >60)
GFR calc non Af Amer: 7 mL/min — ABNORMAL LOW (ref >60)
Glucose, Bld: 156 mg/dL — ABNORMAL HIGH (ref 70–99)
Phosphorus: 3.6 mg/dL (ref 2.5–4.6)
Potassium: 3.9 mmol/L (ref 3.5–5.1)
Sodium: 138 mmol/L (ref 135–145)

## 2018-08-31 LAB — GLUCOSE, CAPILLARY
Glucose-Capillary: 149 mg/dL — ABNORMAL HIGH (ref 70–99)
Glucose-Capillary: 150 mg/dL — ABNORMAL HIGH (ref 70–99)

## 2018-08-31 MED ORDER — AMLODIPINE BESYLATE 10 MG PO TABS: 10.0000 mg | ORAL_TABLET | Freq: Every day | ORAL | Status: DC

## 2018-08-31 MED ORDER — PRAVASTATIN SODIUM 10 MG PO TABS: 10.0000 mg | ORAL_TABLET | Freq: Every day | ORAL | Status: DC

## 2018-08-31 MED ORDER — CALCIUM ACETATE (PHOS BINDER) 667 MG PO CAPS: 1334.0000 mg | ORAL_CAPSULE | Freq: Three times a day (TID) | ORAL | Status: DC

## 2018-08-31 MED ORDER — BISACODYL 10 MG RE SUPP
10.0000 mg | Freq: Once | RECTAL | Status: AC
  Administered 2018-08-31: 10 mg via RECTAL
  Filled 2018-08-31: qty 1

## 2018-08-31 MED ORDER — CYANOCOBALAMIN 1000 MCG PO TABS: 1000.0000 ug | ORAL_TABLET | Freq: Every day | ORAL | Status: AC

## 2018-08-31 MED ORDER — POLYETHYLENE GLYCOL 3350 17 G PO PACK: 17.0000 g | PACK | Freq: Every day | ORAL | Status: DC

## 2018-08-31 MED ORDER — FERROUS SULFATE 325 (65 FE) MG PO TABS: 325.0000 mg | ORAL_TABLET | Freq: Three times a day (TID) | ORAL | Status: DC

## 2018-08-31 MED ORDER — SODIUM BICARBONATE 650 MG PO TABS: 1300.0000 mg | ORAL_TABLET | Freq: Two times a day (BID) | ORAL | Status: DC

## 2018-08-31 MED ORDER — NICOTINE 21 MG/24HR TD PT24: 21.0000 mg | MEDICATED_PATCH | Freq: Every day | TRANSDERMAL | 0 refills | Status: DC

## 2018-08-31 MED ORDER — POLYETHYLENE GLYCOL 3350 17 G PO PACK
17.0000 g | PACK | Freq: Every day | ORAL | Status: DC
  Administered 2018-08-31: 17 g via ORAL
  Filled 2018-08-31: qty 1

## 2018-08-31 MED ORDER — NEPRO/CARBSTEADY PO LIQD: 237.0000 mL | Freq: Two times a day (BID) | ORAL | Status: DC

## 2018-08-31 MED ORDER — INSULIN ASPART 100 UNIT/ML ~~LOC~~ SOLN: 0.0000 [IU] | Freq: Three times a day (TID) | SUBCUTANEOUS | Status: DC

## 2018-08-31 MED ORDER — DOCUSATE SODIUM 100 MG PO CAPS: 100.0000 mg | ORAL_CAPSULE | Freq: Two times a day (BID) | ORAL | Status: AC

## 2018-08-31 NOTE — TOC Transition Note (Signed)
Transition of Care [facility]) - CM/SW Discharge Note   Patient Details  Name: Sandra Sheppard MRN: [id number] Date of Birth: 14-Aug-1945  [facility]) CM/SW Contact:  Bartholomew Crews, RN Phone Number: 08/31/2018, 2:11 PM   Clinical Narrative:    Received notification of insurance authorization this morning. Discharge order and summary provided by MD and sent to facility. Patient and sister, [name], notified of transition today. Patient to go to Room 306. Provided number for bedside RN to call report [phone]). PTAR notified for transport.    Final next level of care: [place] Barriers to Discharge: No Barriers Identified   Patient Goals and CMS Choice Patient states their goals for this hospitalization and ongoing recovery are:: get strength to go home and care for her dog, [place] CMS Medicare.gov Compare Post Acute Care list provided to:: Patient Choice offered to / list presented to : Patient, Sibling(sister, [name], is HCPOA)  Discharge Placement PASRR number recieved: 08/21/18            Patient chooses bed at: [facility] Patient to be transferred to facility by: [place] Name of family member notified: [name] Patient and family notified of of transfer: 08/31/18  Discharge Plan and Services In-house Referral: NA Discharge Planning Services: NA Post Acute Care Choice: [place]          DME Arranged: N/A DME Agency: NA HH Arranged: NA HH Agency: NA   Social Determinants of Health (SDOH) Interventions     Readmission Risk Interventions No flowsheet data found.

## 2018-08-31 NOTE — Progress Notes (Signed)
Patient Discharge: Disposition: Patient discharged to [place] report to the Berenice Bouton (nurse) and answered all her questions. IV: Discontinued IV before discharge. Telemetry: N/A Transportation: Patient transported via [facility]. Belongings: Patient's family took all her belongings with them.

## 2018-08-31 NOTE — Progress Notes (Signed)
Physical Therapy Treatment Patient Details Name: Sandra Sheppard MRN: [id number] DOB: 03/06/1946 Today's Date: 08/31/2018    History of Present Illness Pt is a 73 y/o F with significant PMH of hypertension, type 2 diabetes mellitus, and chronic kidney disease stage III now presenting for evaluation of abnormal outpatient blood work and found to have an acute kidney injury.    PT Comments    Patient received in bed, soundly asleep but easy to wake and willing to participate in PT. She required ModA for functional bed mobility to boost to full upright sitting position at EOB today, once up able to scoot with min guard. MinA for all functional transfers with RW however required heavy cues for safety and sequencing. Very confused today and often asking PT questions such as "is that a cat outside the window?". Only agreeable to gait training approximately 45ft with RW and MinA due to perseveration on being constipated and need to return back to commode to re-attempt having BM. She was left up in the chair with all needs met, chair alarm active this morning.     Follow Up Recommendations  SNF;Supervision/Assistance - 24 hour     Equipment Recommendations  Rolling walker with 5" wheels    Recommendations for Other Services       Precautions / Restrictions Precautions Precautions: Fall Restrictions Weight Bearing Restrictions: No    Mobility  Bed Mobility Overal bed mobility: Needs Assistance Bed Mobility: Supine to Sit     Supine to sit: Mod assist     General bed mobility comments: ModA to bring shoulders up to upright sitting position, extended time; once up able to scoot to EOB with min guard   Transfers Overall transfer level: Needs assistance Equipment used: Rolling walker (2 wheeled) Transfers: Sit to/from Stand Sit to Stand: Min assist         General transfer comment: MinA to boost up to full stand with RW, Mod VC for hand placement and safety    Ambulation/Gait Ambulation/Gait assistance: Min assist Gait Distance (Feet): 45 Feet Assistive device: Rolling walker (2 wheeled) Gait Pattern/deviations: Wide base of support Gait velocity: decreased   General Gait Details: gait distance limited as patient perseverating on being constipated/kept asking to return to commode; MinA for navigation and safe use of RW    Stairs             Wheelchair Mobility    Modified Rankin (Stroke Patients Only)       Balance Overall balance assessment: Needs assistance Sitting-balance support: Feet supported;Bilateral upper extremity supported Sitting balance-Leahy Scale: Good     Standing balance support: Bilateral upper extremity supported;During functional activity Standing balance-Leahy Scale: Fair Standing balance comment: heavy reliance on BUE support                             Cognition Arousal/Alertness: Awake/alert Behavior During Therapy: Flat affect Overall Cognitive Status: Impaired/Different from baseline Area of Impairment: Problem solving;Following commands;Orientation;Memory;Safety/judgement;Awareness                 Orientation Level: Situation   Memory: Decreased short-term memory Following Commands: Follows one step commands with increased time;Follows multi-step commands inconsistently Safety/Judgement: Decreased awareness of safety;Decreased awareness of deficits Awareness: Intellectual Problem Solving: Slow processing;Requires verbal cues;Decreased initiation General Comments: slow initiation and poor safety awareness requiring ongoing cues for safety; often asking PT disoriented questions such as "is that a cat outside the window?"         Exercises      General Comments        Pertinent Vitals/Pain Pain Assessment: Faces Faces Pain Scale: Hurts little more Pain Location: rectum with attempt to have bowel movement Pain Descriptors / Indicators: Discomfort Pain  Intervention(s): Monitored during session    Home Living                      Prior Function            PT Goals (current goals can now be found in the care plan section) Acute Rehab PT Goals Patient Stated Goal: Have a bowel movement; go home. PT Goal Formulation: With patient Time For Goal Achievement: 09/08/18 Potential to Achieve Goals: Good Progress towards PT goals: Progressing toward goals    Frequency    Min 2X/week      PT Plan Current plan remains appropriate    Co-evaluation              AM-PAC PT "6 Clicks" Mobility   Outcome Measure  Help needed turning from your back to your side while in a flat bed without using bedrails?: A Little Help needed moving from lying on your back to sitting on the side of a flat bed without using bedrails?: A Lot Help needed moving to and from a bed to a chair (including a wheelchair)?: A Little Help needed standing up from a chair using your arms (e.g., wheelchair or bedside chair)?: A Little Help needed to walk in hospital room?: A Little Help needed climbing 3-5 steps with a railing? : A Lot 6 Click Score: 16    End of Session Equipment Utilized During Treatment: Gait belt Activity Tolerance: Patient tolerated treatment well Patient left: in chair;with call bell/phone within reach;with chair alarm set   PT Visit Diagnosis: Unsteadiness on feet (R26.81);Muscle weakness (generalized) (M62.81);Difficulty in walking, not elsewhere classified (R26.2)     Time: 0858-0924 PT Time Calculation (min) (ACUTE ONLY): 26 min  Charges:  $Gait Training: 8-22 mins $Therapeutic Activity: 8-22 mins                       PT, DPT, CBIS  Supplemental Physical Therapist [place]    Pager [phone] Acute Rehab Office [phone]    

## 2018-08-31 NOTE — TOC Progression Note (Signed)
Transition of Care [facility]) - Progression Note    Patient Details  Name: Sandra Sheppard MRN: [id number] Date of Birth: 09-Oct-1945  Transition [facility] Care [facility]) CM/SW [place], [place] Phone Number: 08/31/2018, 9:04 AM  Clinical Narrative:    [facility] has received insurance approval for patient to discharge there today.   Expected Discharge Plan: [place] Barriers to Discharge: No Barriers Identified  Expected Discharge Plan and Services Expected Discharge Plan: [place] In-house Referral: NA Discharge Planning Services: NA Post Acute Care Choice: [place] Living arrangements for the past 2 months: Single Family Home Expected Discharge Date: 08/15/18               DME Arranged: N/A DME Agency: NA HH Arranged: NA HH Agency: NA   Social Determinants of Health (SDOH) Interventions    Readmission Risk Interventions No flowsheet data found.

## 2018-08-31 NOTE — Discharge Summary (Signed)
Physician Discharge Summary  RHEALYNN MYHRE NWG:[id number] DOB: 1946-03-08  PCP: Prince Solian, MD  Admit date: 08/08/2018 Discharge date: 08/31/2018  Recommendations for Outpatient Follow-up:  1. MD at SNF in 2 to 3 days.  Please follow repeat labs (BMP twice a week and CBC once a week).  Next lab draw 09/03/2018.  Please forward the labs to Dr. Edrick Oh at [facility]. 2. Dr. Edrick Oh, [place] in 2 to 4 weeks. 3. Dr. Prince Solian, PCP upon discharge from SNF. 4. Recommend checking B12 levels in a couple of weeks. 5. Consider outpatient hematology/oncology consultation (elevated Kappa and lambda free light chains) for evaluation.  Home Health: Patient being discharged to [facility] SNF. Equipment/Devices: TBD at SNF  Discharge Condition: Improved and stable CODE STATUS: Partial/DNI Diet recommendation: Heart healthy/renal and diabetic diet.  Discharge Diagnoses:  Principal Problem:   AKI (acute kidney injury) ([place]) Active Problems:   HYPERTENSION, MILD   CKD (chronic kidney disease) stage 3, GFR 30-59 ml/min (HCC)   Hyperkalemia   Metabolic acidosis, increased anion gap   Normocytic anemia   Diabetes mellitus type II, non insulin dependent (HCC)   Hypocalcemia   FTT (failure to thrive) in adult   Acute renal failure with acute tubular necrosis superimposed on stage 3 chronic kidney disease (HCC)   Palliative care encounter   Brief Summary: 73 year old past medical history significant for hypertension, diabetes, stage III chronic kidney disease with a creatinine baseline 1.5-2.0 who presented with nausea vomiting and diarrhea.  She was found to be in acute on chronic renal failure with a creatinine at 12.  Nephrologist was consulted, working diagnosis is ischemic acute tubular necrosis with concomitant use of lisinopril/hydrochlorothiazide and metformin.  M spike was negative.  Renal ultrasound was negative for hydronephrosis.   Patient was treated with IV fluids.  Subsequently creatinine function has been improving slowly.  Patient was cleared for discharge by nephrology.  She has been confused this is likely related to hospital delirium which has improved and may have even resolved at this time.    Assessment & Plan:   Acute on chronic kidney disease a stage III in the setting of probably ischemic ATN  - in the setting of dehydration and lisinopril/hydrochlorothiazide and metformin use. - She was treated with IV fluids. - Per Dr [employer] with Nephrology, patient will  need renal function in 3 to 4 days after  discharge, results can be called to [place] to Dr. Justin Mend attention. - Follow up with Nephrology in 2-4 weeks.  - Creatinine progressively improving and down to 5.3 today. - Lisinopril/HCTZ and metformin discontinued indefinitely. - Continues to improve and stable.  Generalized edema, bilateral arm edema. Venous Doppler upper and lower extremity negative for DVT. Clinically appears euvolemic.  Hyperkalemia Noted on admission.  Related to AKI and ace inhibitor.  Resolved.  Normocytic anemia:  B12 low at 117.  She has received intramuscular supplements.  Continue with oral supplements.  Monitor hb.  Recommend checking B12 levels in a couple of weeks.  Anemia panel:B12: 117, ferritin 54, iron 78, TIBC 265, saturation ratio 29, RBC folate 25 UPEP: M spike not observed and in apparent normal immunofixation pattern. SPEP: Suggestive of a subacute inflammatory response.  M spike not observed.  Essential hypertension:  Hydrochlorothiazide and lisinopril have been discontinued due to AKI. Started on Norvasc. BP stable.   Diabetes:  Continue with a sliding scale insulin.  Reasonable inpatient control.  Discontinued metformin indefinitely. A1c:  6.5.  Hyperlipidemia:  Continue with pravastatin at reduced dose.  Acute confusional state:  Suspect delirium. MRI negative for acute stroke.  Ammonia level normal.  Delirium precautions.   Baseline mental status not known.  Suspect due to acute metabolic encephalopathy complicating possible underlying dementia. Patient has been coherent and interacting appropriately over the last 2 days.  Suspect that her confusion has significantly improved or even resolved. TSH 3.464  Lactic acidosis in the setting of renal failure.   Increase bicarb on 3-13. Gap close, bicarb at 21  Hypoalbuminemia;  started protein supplement.   Vitamin D deficiency Vitamin D 25 hydroxy level: 18.7.  Continue vitamin supplements.  PTH 204 (likely secondary hyperparathyroidism).  Constipation Initiated bowel regimen.   Consultants:  Nephrology Palliative care consultation CIR  Discharge Instructions  Discharge Instructions    Call MD for:   Complete by:  As directed    Recurrent confusion or altered mental status.   Call MD for:  difficulty breathing, headache or visual disturbances   Complete by:  As directed    Call MD for:  extreme fatigue   Complete by:  As directed    Call MD for:  persistant dizziness or light-headedness   Complete by:  As directed    Call MD for:  persistant nausea and vomiting   Complete by:  As directed    Call MD for:  severe uncontrolled pain   Complete by:  As directed    Call MD for:  temperature >100.4   Complete by:  As directed    Diet - low sodium heart healthy   Complete by:  As directed    And renal diet.   Diet Carb Modified   Complete by:  As directed    Increase activity slowly   Complete by:  As directed        Medication List    STOP taking these medications   levofloxacin 500 MG tablet Commonly known as:  LEVAQUIN   lisinopril-hydrochlorothiazide 20-25 MG tablet Commonly known as:  PRINZIDE,ZESTORETIC   metFORMIN 500 MG 24 hr tablet Commonly known as:  GLUCOPHAGE-XR   PRESERVISION AREDS 2 PO     TAKE these medications   amLODipine 10 MG tablet Commonly known as:   NORVASC Take 1 tablet (10 mg total) by mouth daily. Start taking on:  September 01, 2018 What changed:    medication strength  how much to take   aspirin 81 MG tablet Take 81 mg by mouth daily.   calcium acetate 667 MG capsule Commonly known as:  PHOSLO Take 2 capsules (1,334 mg total) by mouth 3 (three) times daily with meals.   cyanocobalamin 1000 MCG tablet Take 1 tablet (1,000 mcg total) by mouth daily.   docusate sodium 100 MG capsule Commonly known as:  COLACE Take 1 capsule (100 mg total) by mouth 2 (two) times daily.   feeding supplement (NEPRO CARB STEADY) Liqd Take 237 mLs by mouth 2 (two) times daily between meals.   ferrous sulfate 325 (65 FE) MG tablet Take 1 tablet (325 mg total) by mouth 3 (three) times daily with meals.   fluticasone 50 MCG/ACT nasal spray Commonly known as:  FLONASE Place 1-2 sprays into both nostrils as needed.   insulin aspart 100 UNIT/ML injection Commonly known as:  novoLOG Inject 0-9 Units into the skin 3 (three) times daily with meals. CBG < 70: implement hypoglycemia protocol CBG 70 - 120: 0 units CBG 121 - 150: 1 unit CBG 151 -  200: 2 units CBG 201 - 250: 3 units CBG 251 - 300: 5 units CBG 301 - 350: 7 units CBG 351 - 400: 9 units CBG > 400: call MD.   loratadine 10 MG tablet Commonly known as:  CLARITIN Take 10 mg by mouth daily.   multivitamin capsule Take 1 capsule by mouth daily.   nicotine 21 mg/24hr patch Commonly known as:  NICODERM CQ - dosed in mg/24 hours Place 1 patch (21 mg total) onto the skin daily.   ondansetron 4 MG tablet Commonly known as:  ZOFRAN Take 4 mg by mouth every 6 (six) hours as needed for nausea/vomiting.   polyethylene glycol packet Commonly known as:  MIRALAX / GLYCOLAX Take 17 g by mouth daily.   pravastatin 10 MG tablet Commonly known as:  PRAVACHOL Take 1 tablet (10 mg total) by mouth daily. Start taking on:  September 01, 2018 What changed:    medication strength  how much to  take   Reclast 5 MG/100ML Soln injection Generic drug:  zoledronic acid Inject 5 mg into the vein See admin instructions. Once a year   sodium bicarbonate 650 MG tablet Take 2 tablets (1,300 mg total) by mouth 2 (two) times daily.   Vitamin D-3 25 MCG (1000 UT) Caps Take 2,000 Units by mouth daily.       Contact information for follow-up providers    Schedule an appointment as soon as possible for a visit with Avva, Ravisankar, MD.   Specialty:  Internal Medicine Why:  Upon discharge from SNF.  To be seen with repeat labs (CBC & BMP). Contact information: [place] [address] [phone]        Edrick Oh, MD Follow up today.   Specialty:  Nephrology Why:  To be seen in 2 to 4 weeks. Contact information: [place] [address] [phone]        MD at SNF. Schedule an appointment as soon as possible for a visit today.   Why:  To be seen in 2 to 3 days.  Recommend repeating labs (twice a week BMP and once weekly CBC).  Please also forward these lab results to Dr. Edrick Oh, nephrology with [facility]           Contact information for after-discharge care    [facility] .   Service:  Skilled Nursing Contact information: [address] [address] [phone]                 Allergies  Allergen Reactions  . Codeine     Rash, Nausea and Vomiting   . Sulfonamide Derivatives   . Penicillins Rash    Has patient had a PCN reaction causing immediate rash, facial/tongue/throat swelling, SOB or lightheadedness with hypotension: YES Has patient had a PCN reaction causing severe rash involving mucus membranes or skin necrosis:NO Has patient had a PCN reaction that required hospitalization NO Has patient had a PCN reaction occurring within the last 10 years: NO If all of the above answers are "NO", then may proceed with Cephalosporin use.       Procedures/Studies: Mr Brain Wo Contrast  Result Date: 08/27/2018 CLINICAL DATA:  Altered level of consciousness. EXAM: MRI HEAD WITHOUT CONTRAST TECHNIQUE: Multiplanar, multiecho pulse sequences of the brain and surrounding structures were obtained without intravenous contrast. COMPARISON:  None. FINDINGS: Brain: There is no evidence of acute infarct, mass, midline shift, or  extra-axial fluid collection. Chronic microhemorrhages are noted in the left and possibly right cerebellar hemispheres. There are chronic lacunar infarcts in the right basal ganglia with a small amount of associated chronic blood products. Chronic lacunar infarcts are also noted in the right corona radiata and left cerebellum. Patchy to confluent T2 hyperintensities throughout the cerebral white matter bilaterally are nonspecific but compatible with moderate to severe chronic small vessel ischemic disease. There is moderate cerebral atrophy. Vascular: Major intracranial vascular flow voids are preserved. Skull and upper cervical spine: Unremarkable bone marrow signal. Sinuses/Orbits: Unremarkable orbits. Clear paranasal sinuses. Large right and small left mastoid effusions. Right middle ear effusion. Other: None. IMPRESSION: 1. No acute intracranial abnormality. 2. Moderate to severe chronic small vessel ischemic disease with chronic lacunar infarcts as above. 3. Right larger than left mastoid and right middle ear effusions. Electronically Signed   By: Logan Bores M.D.   On: 08/27/2018 11:47   US Renal  Result Date: 08/08/2018 CLINICAL DATA:  Renal failure EXAM: RENAL / URINARY TRACT ULTRASOUND COMPLETE COMPARISON:  None. FINDINGS: Right Kidney: Renal measurements: 10.9 x 4.7 x 5.3 cm = volume: 145 mL. Diffusely increased parenchymal echotexture suggesting chronic medical renal disease. No hydronephrosis. Left Kidney: Renal measurements: 10.8 x 4.9 x 4.5 cm = volume: 127 mL. Small simple appearing cyst in the midpole measuring  1.2 cm maximal diameter. Diffusely increased parenchymal echotexture suggesting chronic medical disease. No hydronephrosis. Bladder: Appears normal for degree of bladder distention. Incidental note of cholelithiasis. IMPRESSION: 1. Increased parenchymal echotexture in the kidney suggesting chronic medical renal disease. No hydronephrosis. 2. Small benign-appearing cyst on the left kidney. 3. Incidental note of cholelithiasis. Electronically Signed   By: Lucienne Capers M.D.   On: 08/08/2018 20:48   Vas [place] Lower Extremity Venous (dvt)  Result Date: 08/21/2018  Lower Venous Study Indications: Edema.  Performing Technologist: Abram Sander RVS  Examination Guidelines: A complete evaluation includes B-mode imaging, spectral Doppler, color Doppler, and power Doppler as needed of all accessible portions of each vessel. Bilateral testing is considered an integral part of a complete examination. Limited examinations for reoccurring indications may be performed as noted.  Right Venous Findings: +---------+---------------+---------+-----------+----------+-------+          CompressibilityPhasicitySpontaneityPropertiesSummary +---------+---------------+---------+-----------+----------+-------+ CFV      Full           Yes      Yes                          +---------+---------------+---------+-----------+----------+-------+ SFJ      Full                                                 +---------+---------------+---------+-----------+----------+-------+ FV Prox  Full                                                 +---------+---------------+---------+-----------+----------+-------+ FV Mid   Full                                                 +---------+---------------+---------+-----------+----------+-------+ FV DistalFull                                                 +---------+---------------+---------+-----------+----------+-------+  PFV      Full                                                  +---------+---------------+---------+-----------+----------+-------+ POP      Full           Yes      Yes                          +---------+---------------+---------+-----------+----------+-------+ PTV      Full                                                 +---------+---------------+---------+-----------+----------+-------+ PERO     Full                                                 +---------+---------------+---------+-----------+----------+-------+  Left Venous Findings: +---------+---------------+---------+-----------+----------+--------------+          CompressibilityPhasicitySpontaneityPropertiesSummary        +---------+---------------+---------+-----------+----------+--------------+ CFV      Full           Yes      Yes                                 +---------+---------------+---------+-----------+----------+--------------+ SFJ      Full                                                        +---------+---------------+---------+-----------+----------+--------------+ FV Prox  Full                                                        +---------+---------------+---------+-----------+----------+--------------+ FV Mid   Full                                                        +---------+---------------+---------+-----------+----------+--------------+ FV DistalFull                                                        +---------+---------------+---------+-----------+----------+--------------+ PFV      Full                                                        +---------+---------------+---------+-----------+----------+--------------+  POP      Full           Yes      Yes                                 +---------+---------------+---------+-----------+----------+--------------+ PTV      Full                                                         +---------+---------------+---------+-----------+----------+--------------+ PERO                                                  Not visualized +---------+---------------+---------+-----------+----------+--------------+    Summary: Right: There is no evidence of deep vein thrombosis in the lower extremity. No cystic structure found in the popliteal fossa. Left: There is no evidence of deep vein thrombosis in the lower extremity. No cystic structure found in the popliteal fossa.  *See table(s) above for measurements and observations. Electronically signed by Deitra Mayo MD on 08/21/2018 at 4:29:31 PM.    Final    Vas [place] Upper Extremity Venous Duplex  Result Date: 08/16/2018 UPPER VENOUS STUDY  Indications: Swelling Other Indications: Renal failure. Performing Technologist: Sharion Dove  Examination Guidelines: A complete evaluation includes B-mode imaging, spectral Doppler, color Doppler, and power Doppler as needed of all accessible portions of each vessel. Bilateral testing is considered an integral part of a complete examination. Limited examinations for reoccurring indications may be performed as noted.  Right Findings: +----------+------------+----------+---------+-----------+-------+ RIGHT     CompressiblePropertiesPhasicitySpontaneousSummary +----------+------------+----------+---------+-----------+-------+ IJV           Full                 Yes       Yes            +----------+------------+----------+---------+-----------+-------+ Subclavian                         Yes       Yes            +----------+------------+----------+---------+-----------+-------+ Axillary      Full                 Yes       Yes            +----------+------------+----------+---------+-----------+-------+ Radial        Full                                          +----------+------------+----------+---------+-----------+-------+ Ulnar         Full                                           +----------+------------+----------+---------+-----------+-------+ Cephalic      Full                                          +----------+------------+----------+---------+-----------+-------+  Basilic       Full                                          +----------+------------+----------+---------+-----------+-------+  Left Findings: +----------+------------+----------+---------+-----------+-------+ LEFT      CompressiblePropertiesPhasicitySpontaneousSummary +----------+------------+----------+---------+-----------+-------+ IJV           Full                 Yes       Yes            +----------+------------+----------+---------+-----------+-------+ Subclavian                         Yes       Yes            +----------+------------+----------+---------+-----------+-------+ Axillary      Full                 Yes       Yes            +----------+------------+----------+---------+-----------+-------+ Brachial                           Yes       Yes            +----------+------------+----------+---------+-----------+-------+ Radial        Full                                          +----------+------------+----------+---------+-----------+-------+ Cephalic      Full                                          +----------+------------+----------+---------+-----------+-------+ Basilic       Full                                          +----------+------------+----------+---------+-----------+-------+  Summary:  Right: No evidence of deep vein thrombosis in the upper extremity. No evidence of superficial vein thrombosis in the upper extremity.  Left: No evidence of deep vein thrombosis in the upper extremity. No evidence of superficial vein thrombosis in the upper extremity.  *See table(s) above for measurements and observations.  Diagnosing physician: Servando Snare MD Electronically signed by Servando Snare MD on 08/16/2018 at 5:52:06 PM.     Final       Subjective: Denies complaints except constipation.  States that she has not had a BM in the last couple of days however BM charted 3/19.  Passing flatus.  No abdominal pain, nausea or vomiting.  As per RN, no acute issues noted.  Discharge Exam:  Vitals:   08/30/18 1758 08/30/18 2002 08/31/18 0347 08/31/18 0930  BP: (!) 128/43 (!) 151/65 136/65 129/62  Pulse: (!) 58 78 69 72  Resp:  (!) 21 19 20   Temp: 98.4 F (36.9 C) 98.2 F (36.8 C) 97.8 F (36.6 C) 97.8 F (36.6 C)  TempSrc: Oral Oral Oral Oral  SpO2: 98% 95% 94% 95%  Weight:  56.2 kg    Height:  General exam: Pleasant elderly female, moderately built and frail sitting up comfortably in reclining chair this morning.  Oral mucosa moist. Respiratory system: Clear to auscultation.  No increased work of breathing. Cardiovascular system: S 1, S 2 RRR.  No JVD, murmurs or pedal edema. Gastrointestinal system:  Nondistended, soft and nontender.  No organomegaly or masses appreciated.  Normal bowel sounds heard. Central nervous system: Alert and oriented x 3.  No focal neurological deficits. Extremities: Symmetric 5 x 5 power.    The results of significant diagnostics from this hospitalization (including imaging, microbiology, ancillary and laboratory) are listed below for reference.      Labs: CBC: Recent Labs  Lab 08/26/18 0252 08/28/18 0522  WBC 5.6 6.6  HGB 8.9* 8.8*  HCT 27.9* 27.2*  MCV 88.6 89.2  PLT 216 212   Basic Metabolic Panel: Recent Labs  Lab 08/27/18 0732 08/28/18 0522 08/29/18 0549 08/30/18 0448 08/31/18 0509  NA 137 135 138 138 138  K 3.8 3.8 3.8 3.6 3.9  CL 105 102 100 98 101  CO2 20* 23 27 24 24   GLUCOSE 153* 168* 162* 156* 156*  BUN 62* 65* 68* 68* 67*  CREATININE 6.46* 6.15* 5.85* 5.95* 5.33*  CALCIUM 9.2 9.4 10.1 9.8 9.6  PHOS 2.8 4.0 3.5 3.4 3.6   Liver Function Tests: Recent Labs  Lab 08/27/18 0732 08/28/18 0522 08/29/18 0549 08/30/18 0448 08/31/18 0509   ALBUMIN 2.8* 2.7* 2.8* 2.8* 2.9*   CBG: Recent Labs  Lab 08/30/18 1147 08/30/18 1619 08/30/18 2004 08/30/18 2229 08/31/18 0720  GLUCAP 165* 125* 202* 165* 149*   Urinalysis    Component Value Date/Time   COLORURINE YELLOW 08/08/2018 1812   APPEARANCEUR CLEAR 08/08/2018 1812   LABSPEC 1.011 08/08/2018 1812   PHURINE 5.0 08/08/2018 1812   GLUCOSEU 50 (A) 08/08/2018 1812   HGBUR MODERATE (A) 08/08/2018 1812   BILIRUBINUR NEGATIVE 08/08/2018 1812   KETONESUR NEGATIVE 08/08/2018 1812   PROTEINUR 100 (A) 08/08/2018 1812   UROBILINOGEN 0.2 01/14/2010 1308   NITRITE NEGATIVE 08/08/2018 1812   LEUKOCYTESUR NEGATIVE 08/08/2018 1812    I discussed with patient's sister, updated care and answered questions.  She spoke with the patient yesterday and feels that her confusion has significantly improved compared to admission.  Time coordinating discharge: 40 minutes  SIGNED:  Vernell Leep, MD, FACP, [facility]. [facility]  To contact the attending provider between 7A-7P or the covering provider during after hours 7P-7A, please log into the web site [email or web address] and access using universal [place] password for that web site. If you do not have the password, please call the hospital operator.

## 2018-08-31 NOTE — Discharge Instructions (Signed)

## 2018-09-05 DIAGNOSIS — E46 Unspecified protein-calorie malnutrition: Secondary | ICD-10-CM | POA: Diagnosis not present

## 2018-09-05 DIAGNOSIS — D519 Vitamin B12 deficiency anemia, unspecified: Secondary | ICD-10-CM | POA: Diagnosis not present

## 2018-09-05 DIAGNOSIS — E559 Vitamin D deficiency, unspecified: Secondary | ICD-10-CM | POA: Diagnosis not present

## 2018-09-05 DIAGNOSIS — N183 Chronic kidney disease, stage 3 (moderate): Secondary | ICD-10-CM | POA: Diagnosis not present

## 2018-09-08 DIAGNOSIS — N170 Acute kidney failure with tubular necrosis: Secondary | ICD-10-CM | POA: Diagnosis not present

## 2018-09-08 DIAGNOSIS — Z681 Body mass index (BMI) 19 or less, adult: Secondary | ICD-10-CM | POA: Diagnosis not present

## 2018-09-08 DIAGNOSIS — R410 Disorientation, unspecified: Secondary | ICD-10-CM | POA: Diagnosis not present

## 2018-09-08 DIAGNOSIS — E1122 Type 2 diabetes mellitus with diabetic chronic kidney disease: Secondary | ICD-10-CM | POA: Diagnosis not present

## 2018-09-08 DIAGNOSIS — I129 Hypertensive chronic kidney disease with stage 1 through stage 4 chronic kidney disease, or unspecified chronic kidney disease: Secondary | ICD-10-CM | POA: Diagnosis not present

## 2018-09-08 DIAGNOSIS — I6529 Occlusion and stenosis of unspecified carotid artery: Secondary | ICD-10-CM | POA: Diagnosis not present

## 2018-09-08 DIAGNOSIS — D631 Anemia in chronic kidney disease: Secondary | ICD-10-CM | POA: Diagnosis not present

## 2018-09-08 DIAGNOSIS — E46 Unspecified protein-calorie malnutrition: Secondary | ICD-10-CM | POA: Diagnosis not present

## 2018-09-08 DIAGNOSIS — N183 Chronic kidney disease, stage 3 (moderate): Secondary | ICD-10-CM | POA: Diagnosis not present

## 2018-09-08 DIAGNOSIS — D519 Vitamin B12 deficiency anemia, unspecified: Secondary | ICD-10-CM | POA: Diagnosis not present

## 2018-09-09 DIAGNOSIS — D631 Anemia in chronic kidney disease: Secondary | ICD-10-CM | POA: Diagnosis not present

## 2018-09-09 DIAGNOSIS — N183 Chronic kidney disease, stage 3 (moderate): Secondary | ICD-10-CM | POA: Diagnosis not present

## 2018-09-09 DIAGNOSIS — R410 Disorientation, unspecified: Secondary | ICD-10-CM | POA: Diagnosis not present

## 2018-09-09 DIAGNOSIS — Z681 Body mass index (BMI) 19 or less, adult: Secondary | ICD-10-CM | POA: Diagnosis not present

## 2018-09-09 DIAGNOSIS — I6529 Occlusion and stenosis of unspecified carotid artery: Secondary | ICD-10-CM | POA: Diagnosis not present

## 2018-09-09 DIAGNOSIS — D519 Vitamin B12 deficiency anemia, unspecified: Secondary | ICD-10-CM | POA: Diagnosis not present

## 2018-09-09 DIAGNOSIS — I129 Hypertensive chronic kidney disease with stage 1 through stage 4 chronic kidney disease, or unspecified chronic kidney disease: Secondary | ICD-10-CM | POA: Diagnosis not present

## 2018-09-09 DIAGNOSIS — N170 Acute kidney failure with tubular necrosis: Secondary | ICD-10-CM | POA: Diagnosis not present

## 2018-09-09 DIAGNOSIS — E46 Unspecified protein-calorie malnutrition: Secondary | ICD-10-CM | POA: Diagnosis not present

## 2018-09-09 DIAGNOSIS — E1122 Type 2 diabetes mellitus with diabetic chronic kidney disease: Secondary | ICD-10-CM | POA: Diagnosis not present

## 2018-09-11 DIAGNOSIS — N183 Chronic kidney disease, stage 3 (moderate): Secondary | ICD-10-CM | POA: Diagnosis not present

## 2018-09-11 DIAGNOSIS — D631 Anemia in chronic kidney disease: Secondary | ICD-10-CM | POA: Diagnosis not present

## 2018-09-11 DIAGNOSIS — D519 Vitamin B12 deficiency anemia, unspecified: Secondary | ICD-10-CM | POA: Diagnosis not present

## 2018-09-11 DIAGNOSIS — N170 Acute kidney failure with tubular necrosis: Secondary | ICD-10-CM | POA: Diagnosis not present

## 2018-09-11 DIAGNOSIS — I6529 Occlusion and stenosis of unspecified carotid artery: Secondary | ICD-10-CM | POA: Diagnosis not present

## 2018-09-11 DIAGNOSIS — E46 Unspecified protein-calorie malnutrition: Secondary | ICD-10-CM | POA: Diagnosis not present

## 2018-09-11 DIAGNOSIS — R410 Disorientation, unspecified: Secondary | ICD-10-CM | POA: Diagnosis not present

## 2018-09-11 DIAGNOSIS — Z681 Body mass index (BMI) 19 or less, adult: Secondary | ICD-10-CM | POA: Diagnosis not present

## 2018-09-11 DIAGNOSIS — I129 Hypertensive chronic kidney disease with stage 1 through stage 4 chronic kidney disease, or unspecified chronic kidney disease: Secondary | ICD-10-CM | POA: Diagnosis not present

## 2018-09-11 DIAGNOSIS — E1122 Type 2 diabetes mellitus with diabetic chronic kidney disease: Secondary | ICD-10-CM | POA: Diagnosis not present

## 2018-09-12 DIAGNOSIS — R69 Illness, unspecified: Secondary | ICD-10-CM | POA: Diagnosis not present

## 2018-09-12 DIAGNOSIS — I129 Hypertensive chronic kidney disease with stage 1 through stage 4 chronic kidney disease, or unspecified chronic kidney disease: Secondary | ICD-10-CM | POA: Diagnosis not present

## 2018-09-12 DIAGNOSIS — R410 Disorientation, unspecified: Secondary | ICD-10-CM | POA: Diagnosis not present

## 2018-09-12 DIAGNOSIS — R2689 Other abnormalities of gait and mobility: Secondary | ICD-10-CM | POA: Diagnosis not present

## 2018-09-12 DIAGNOSIS — N183 Chronic kidney disease, stage 3 (moderate): Secondary | ICD-10-CM | POA: Diagnosis not present

## 2018-09-12 DIAGNOSIS — D631 Anemia in chronic kidney disease: Secondary | ICD-10-CM | POA: Diagnosis not present

## 2018-09-12 DIAGNOSIS — N170 Acute kidney failure with tubular necrosis: Secondary | ICD-10-CM | POA: Diagnosis not present

## 2018-09-12 DIAGNOSIS — E559 Vitamin D deficiency, unspecified: Secondary | ICD-10-CM | POA: Diagnosis not present

## 2018-09-12 DIAGNOSIS — D519 Vitamin B12 deficiency anemia, unspecified: Secondary | ICD-10-CM | POA: Diagnosis not present

## 2018-09-12 DIAGNOSIS — R4182 Altered mental status, unspecified: Secondary | ICD-10-CM | POA: Diagnosis not present

## 2018-09-12 DIAGNOSIS — Z681 Body mass index (BMI) 19 or less, adult: Secondary | ICD-10-CM | POA: Diagnosis not present

## 2018-09-12 DIAGNOSIS — N179 Acute kidney failure, unspecified: Secondary | ICD-10-CM | POA: Diagnosis not present

## 2018-09-12 DIAGNOSIS — D509 Iron deficiency anemia, unspecified: Secondary | ICD-10-CM | POA: Diagnosis not present

## 2018-09-12 DIAGNOSIS — E1122 Type 2 diabetes mellitus with diabetic chronic kidney disease: Secondary | ICD-10-CM | POA: Diagnosis not present

## 2018-09-12 DIAGNOSIS — I119 Hypertensive heart disease without heart failure: Secondary | ICD-10-CM | POA: Diagnosis not present

## 2018-09-12 DIAGNOSIS — E538 Deficiency of other specified B group vitamins: Secondary | ICD-10-CM | POA: Diagnosis not present

## 2018-09-12 DIAGNOSIS — I6529 Occlusion and stenosis of unspecified carotid artery: Secondary | ICD-10-CM | POA: Diagnosis not present

## 2018-09-12 DIAGNOSIS — E46 Unspecified protein-calorie malnutrition: Secondary | ICD-10-CM | POA: Diagnosis not present

## 2018-09-12 DIAGNOSIS — E1159 Type 2 diabetes mellitus with other circulatory complications: Secondary | ICD-10-CM | POA: Diagnosis not present

## 2018-09-13 DIAGNOSIS — Z681 Body mass index (BMI) 19 or less, adult: Secondary | ICD-10-CM | POA: Diagnosis not present

## 2018-09-13 DIAGNOSIS — N179 Acute kidney failure, unspecified: Secondary | ICD-10-CM | POA: Diagnosis not present

## 2018-09-13 DIAGNOSIS — R410 Disorientation, unspecified: Secondary | ICD-10-CM | POA: Diagnosis not present

## 2018-09-13 DIAGNOSIS — E1122 Type 2 diabetes mellitus with diabetic chronic kidney disease: Secondary | ICD-10-CM | POA: Diagnosis not present

## 2018-09-13 DIAGNOSIS — I129 Hypertensive chronic kidney disease with stage 1 through stage 4 chronic kidney disease, or unspecified chronic kidney disease: Secondary | ICD-10-CM | POA: Diagnosis not present

## 2018-09-13 DIAGNOSIS — D519 Vitamin B12 deficiency anemia, unspecified: Secondary | ICD-10-CM | POA: Diagnosis not present

## 2018-09-13 DIAGNOSIS — E46 Unspecified protein-calorie malnutrition: Secondary | ICD-10-CM | POA: Diagnosis not present

## 2018-09-13 DIAGNOSIS — N170 Acute kidney failure with tubular necrosis: Secondary | ICD-10-CM | POA: Diagnosis not present

## 2018-09-13 DIAGNOSIS — N183 Chronic kidney disease, stage 3 (moderate): Secondary | ICD-10-CM | POA: Diagnosis not present

## 2018-09-13 DIAGNOSIS — I10 Essential (primary) hypertension: Secondary | ICD-10-CM | POA: Diagnosis not present

## 2018-09-13 DIAGNOSIS — D631 Anemia in chronic kidney disease: Secondary | ICD-10-CM | POA: Diagnosis not present

## 2018-09-13 DIAGNOSIS — I6529 Occlusion and stenosis of unspecified carotid artery: Secondary | ICD-10-CM | POA: Diagnosis not present

## 2018-09-14 DIAGNOSIS — Z681 Body mass index (BMI) 19 or less, adult: Secondary | ICD-10-CM | POA: Diagnosis not present

## 2018-09-14 DIAGNOSIS — N183 Chronic kidney disease, stage 3 (moderate): Secondary | ICD-10-CM | POA: Diagnosis not present

## 2018-09-14 DIAGNOSIS — I129 Hypertensive chronic kidney disease with stage 1 through stage 4 chronic kidney disease, or unspecified chronic kidney disease: Secondary | ICD-10-CM | POA: Diagnosis not present

## 2018-09-14 DIAGNOSIS — N170 Acute kidney failure with tubular necrosis: Secondary | ICD-10-CM | POA: Diagnosis not present

## 2018-09-14 DIAGNOSIS — D519 Vitamin B12 deficiency anemia, unspecified: Secondary | ICD-10-CM | POA: Diagnosis not present

## 2018-09-14 DIAGNOSIS — D631 Anemia in chronic kidney disease: Secondary | ICD-10-CM | POA: Diagnosis not present

## 2018-09-14 DIAGNOSIS — I6529 Occlusion and stenosis of unspecified carotid artery: Secondary | ICD-10-CM | POA: Diagnosis not present

## 2018-09-14 DIAGNOSIS — R410 Disorientation, unspecified: Secondary | ICD-10-CM | POA: Diagnosis not present

## 2018-09-14 DIAGNOSIS — E1122 Type 2 diabetes mellitus with diabetic chronic kidney disease: Secondary | ICD-10-CM | POA: Diagnosis not present

## 2018-09-14 DIAGNOSIS — E46 Unspecified protein-calorie malnutrition: Secondary | ICD-10-CM | POA: Diagnosis not present

## 2018-09-18 DIAGNOSIS — N170 Acute kidney failure with tubular necrosis: Secondary | ICD-10-CM | POA: Diagnosis not present

## 2018-09-18 DIAGNOSIS — I129 Hypertensive chronic kidney disease with stage 1 through stage 4 chronic kidney disease, or unspecified chronic kidney disease: Secondary | ICD-10-CM | POA: Diagnosis not present

## 2018-09-18 DIAGNOSIS — N183 Chronic kidney disease, stage 3 (moderate): Secondary | ICD-10-CM | POA: Diagnosis not present

## 2018-09-19 DIAGNOSIS — Z681 Body mass index (BMI) 19 or less, adult: Secondary | ICD-10-CM | POA: Diagnosis not present

## 2018-09-19 DIAGNOSIS — D519 Vitamin B12 deficiency anemia, unspecified: Secondary | ICD-10-CM | POA: Diagnosis not present

## 2018-09-19 DIAGNOSIS — N183 Chronic kidney disease, stage 3 (moderate): Secondary | ICD-10-CM | POA: Diagnosis not present

## 2018-09-19 DIAGNOSIS — I6529 Occlusion and stenosis of unspecified carotid artery: Secondary | ICD-10-CM | POA: Diagnosis not present

## 2018-09-19 DIAGNOSIS — R410 Disorientation, unspecified: Secondary | ICD-10-CM | POA: Diagnosis not present

## 2018-09-19 DIAGNOSIS — N170 Acute kidney failure with tubular necrosis: Secondary | ICD-10-CM | POA: Diagnosis not present

## 2018-09-19 DIAGNOSIS — E1122 Type 2 diabetes mellitus with diabetic chronic kidney disease: Secondary | ICD-10-CM | POA: Diagnosis not present

## 2018-09-19 DIAGNOSIS — D631 Anemia in chronic kidney disease: Secondary | ICD-10-CM | POA: Diagnosis not present

## 2018-09-19 DIAGNOSIS — I129 Hypertensive chronic kidney disease with stage 1 through stage 4 chronic kidney disease, or unspecified chronic kidney disease: Secondary | ICD-10-CM | POA: Diagnosis not present

## 2018-09-19 DIAGNOSIS — E46 Unspecified protein-calorie malnutrition: Secondary | ICD-10-CM | POA: Diagnosis not present

## 2018-09-24 DIAGNOSIS — E1122 Type 2 diabetes mellitus with diabetic chronic kidney disease: Secondary | ICD-10-CM | POA: Diagnosis not present

## 2018-09-24 DIAGNOSIS — N2581 Secondary hyperparathyroidism of renal origin: Secondary | ICD-10-CM | POA: Diagnosis not present

## 2018-09-24 DIAGNOSIS — N183 Chronic kidney disease, stage 3 (moderate): Secondary | ICD-10-CM | POA: Diagnosis not present

## 2018-09-24 DIAGNOSIS — N179 Acute kidney failure, unspecified: Secondary | ICD-10-CM | POA: Diagnosis not present

## 2018-09-24 DIAGNOSIS — I129 Hypertensive chronic kidney disease with stage 1 through stage 4 chronic kidney disease, or unspecified chronic kidney disease: Secondary | ICD-10-CM | POA: Diagnosis not present

## 2018-09-24 DIAGNOSIS — D631 Anemia in chronic kidney disease: Secondary | ICD-10-CM | POA: Diagnosis not present

## 2018-09-24 DIAGNOSIS — N189 Chronic kidney disease, unspecified: Secondary | ICD-10-CM | POA: Diagnosis not present

## 2018-09-25 DIAGNOSIS — I6529 Occlusion and stenosis of unspecified carotid artery: Secondary | ICD-10-CM | POA: Diagnosis not present

## 2018-09-25 DIAGNOSIS — D519 Vitamin B12 deficiency anemia, unspecified: Secondary | ICD-10-CM | POA: Diagnosis not present

## 2018-09-25 DIAGNOSIS — Z681 Body mass index (BMI) 19 or less, adult: Secondary | ICD-10-CM | POA: Diagnosis not present

## 2018-09-25 DIAGNOSIS — N170 Acute kidney failure with tubular necrosis: Secondary | ICD-10-CM | POA: Diagnosis not present

## 2018-09-25 DIAGNOSIS — I129 Hypertensive chronic kidney disease with stage 1 through stage 4 chronic kidney disease, or unspecified chronic kidney disease: Secondary | ICD-10-CM | POA: Diagnosis not present

## 2018-09-25 DIAGNOSIS — R410 Disorientation, unspecified: Secondary | ICD-10-CM | POA: Diagnosis not present

## 2018-09-25 DIAGNOSIS — E1122 Type 2 diabetes mellitus with diabetic chronic kidney disease: Secondary | ICD-10-CM | POA: Diagnosis not present

## 2018-09-25 DIAGNOSIS — N183 Chronic kidney disease, stage 3 (moderate): Secondary | ICD-10-CM | POA: Diagnosis not present

## 2018-09-25 DIAGNOSIS — D631 Anemia in chronic kidney disease: Secondary | ICD-10-CM | POA: Diagnosis not present

## 2018-09-25 DIAGNOSIS — E46 Unspecified protein-calorie malnutrition: Secondary | ICD-10-CM | POA: Diagnosis not present

## 2018-09-26 ENCOUNTER — Encounter: Payer: Self-pay | Admitting: *Deleted

## 2018-09-26 ENCOUNTER — Telehealth: Payer: Self-pay | Admitting: *Deleted

## 2018-09-26 DIAGNOSIS — R410 Disorientation, unspecified: Secondary | ICD-10-CM | POA: Diagnosis not present

## 2018-09-26 DIAGNOSIS — I6529 Occlusion and stenosis of unspecified carotid artery: Secondary | ICD-10-CM | POA: Diagnosis not present

## 2018-09-26 DIAGNOSIS — E1122 Type 2 diabetes mellitus with diabetic chronic kidney disease: Secondary | ICD-10-CM | POA: Diagnosis not present

## 2018-09-26 DIAGNOSIS — D519 Vitamin B12 deficiency anemia, unspecified: Secondary | ICD-10-CM | POA: Diagnosis not present

## 2018-09-26 DIAGNOSIS — E46 Unspecified protein-calorie malnutrition: Secondary | ICD-10-CM | POA: Diagnosis not present

## 2018-09-26 DIAGNOSIS — N170 Acute kidney failure with tubular necrosis: Secondary | ICD-10-CM | POA: Diagnosis not present

## 2018-09-26 DIAGNOSIS — I129 Hypertensive chronic kidney disease with stage 1 through stage 4 chronic kidney disease, or unspecified chronic kidney disease: Secondary | ICD-10-CM | POA: Diagnosis not present

## 2018-09-26 DIAGNOSIS — Z681 Body mass index (BMI) 19 or less, adult: Secondary | ICD-10-CM | POA: Diagnosis not present

## 2018-09-26 DIAGNOSIS — D631 Anemia in chronic kidney disease: Secondary | ICD-10-CM | POA: Diagnosis not present

## 2018-09-26 DIAGNOSIS — N183 Chronic kidney disease, stage 3 (moderate): Secondary | ICD-10-CM | POA: Diagnosis not present

## 2018-09-26 NOTE — Telephone Encounter (Signed)
Called patient, spoke with sister, [name] on [place]. Updated EMR.  Answered questions about [employer]. She  verbalized understanding, appreciation.

## 2018-09-27 ENCOUNTER — Encounter: Payer: Self-pay | Admitting: *Deleted

## 2018-09-27 DIAGNOSIS — R410 Disorientation, unspecified: Secondary | ICD-10-CM | POA: Diagnosis not present

## 2018-09-27 DIAGNOSIS — E46 Unspecified protein-calorie malnutrition: Secondary | ICD-10-CM | POA: Diagnosis not present

## 2018-09-27 DIAGNOSIS — E1122 Type 2 diabetes mellitus with diabetic chronic kidney disease: Secondary | ICD-10-CM | POA: Diagnosis not present

## 2018-09-27 DIAGNOSIS — N170 Acute kidney failure with tubular necrosis: Secondary | ICD-10-CM | POA: Diagnosis not present

## 2018-09-27 DIAGNOSIS — N183 Chronic kidney disease, stage 3 (moderate): Secondary | ICD-10-CM | POA: Diagnosis not present

## 2018-09-27 DIAGNOSIS — Z681 Body mass index (BMI) 19 or less, adult: Secondary | ICD-10-CM | POA: Diagnosis not present

## 2018-09-27 DIAGNOSIS — I129 Hypertensive chronic kidney disease with stage 1 through stage 4 chronic kidney disease, or unspecified chronic kidney disease: Secondary | ICD-10-CM | POA: Diagnosis not present

## 2018-09-27 DIAGNOSIS — D631 Anemia in chronic kidney disease: Secondary | ICD-10-CM | POA: Diagnosis not present

## 2018-09-27 DIAGNOSIS — D519 Vitamin B12 deficiency anemia, unspecified: Secondary | ICD-10-CM | POA: Diagnosis not present

## 2018-09-27 DIAGNOSIS — I6529 Occlusion and stenosis of unspecified carotid artery: Secondary | ICD-10-CM | POA: Diagnosis not present

## 2018-10-01 ENCOUNTER — Encounter: Payer: Self-pay | Admitting: Diagnostic Neuroimaging

## 2018-10-01 ENCOUNTER — Ambulatory Visit (INDEPENDENT_AMBULATORY_CARE_PROVIDER_SITE_OTHER): Payer: Medicare HMO | Admitting: Diagnostic Neuroimaging

## 2018-10-01 ENCOUNTER — Other Ambulatory Visit: Payer: Self-pay

## 2018-10-01 DIAGNOSIS — R413 Other amnesia: Secondary | ICD-10-CM | POA: Diagnosis not present

## 2018-10-01 NOTE — Progress Notes (Signed)
[facility]  PATIENT: ROCSI Sheppard DOB: 08/31/1945  REFERRING CLINICIAN: R Avva HISTORY FROM: patient and sister REASON FOR VISIT: new consult    HISTORICAL  CHIEF COMPLAINT:  No chief complaint on file.   HISTORY OF PRESENT ILLNESS:   73 year old female with hypertension, diabetes, tobacco abuse, here for evaluation of confusion.  Hypertension, diabetes, chronic kidney disease, atrial fibrillation hypercholesterolemia aortic and mitral valve stenosis.  Patient was living alone, in suboptimal state of health.  She was not eating or drinking well at home.  She was smoking cigarettes constantly.  In February 2020 she was diagnosed with ear infection and started on Levaquin.  Then she developed nausea vomiting and diarrhea.  She had abnormal outpatient labs, fatigue, decreased urine output and therefore was referred to the emergency room for evaluation.  Potassium was 6.6 and creatinine was 12.67.  Patient also developed delirium while hospitalized, thought to be related to metabolic encephalopathy.  Patient was stabilized and treated and spent almost 1 month as an inpatient.  She was transitioned to skilled nursing facility for several weeks and then discharged home.  Since being at home patient continues to have some confusion.  Sometimes she declines to take care of her personal hygiene, brushing her teeth, dressing herself.  She is incontinent.  She continues to smoke cigarettes continuously.  She has memory loss and confusion.  Symptoms are improving but not at baseline.  Patient sister does not feel like she can live alone by herself, due to concerns of fire hazard related to cigarette smoking as well as inability to take care of herself or feed herself properly.  Patient would like to live on her own again.  She wants to go back to driving.  She has poor insight.  He is also extremely hard of hearing which limits this evaluation.    REVIEW OF SYSTEMS: Full 14  system review of systems performed and negative with exception of: As per HPI.  ALLERGIES: Allergies  Allergen Reactions   Codeine     Rash, Nausea and Vomiting    Sulfonamide Derivatives    Penicillins Rash    Has patient had a PCN reaction causing immediate rash, facial/tongue/throat swelling, SOB or lightheadedness with hypotension: YES Has patient had a PCN reaction causing severe rash involving mucus membranes or skin necrosis:NO Has patient had a PCN reaction that required hospitalization NO Has patient had a PCN reaction occurring within the last 10 years: NO If all of the above answers are "NO", then may proceed with Cephalosporin use.    HOME MEDICATIONS: Outpatient Medications Prior to Visit  Medication Sig Dispense Refill   amLODipine (NORVASC) 10 MG tablet Take 1 tablet (10 mg total) by mouth daily.     aspirin 81 MG tablet Take 81 mg by mouth daily.       Cholecalciferol (VITAMIN D-3) 1000 UNITS CAPS Take 2,000 Units by mouth daily.      docusate sodium (COLACE) 100 MG capsule Take 1 capsule (100 mg total) by mouth 2 (two) times daily.     ferrous sulfate 325 (65 FE) MG tablet Take 1 tablet (325 mg total) by mouth 3 (three) times daily with meals. (Patient not taking: Reported on 09/26/2018)     fluticasone (FLONASE) 50 MCG/ACT nasal spray Place 1-2 sprays into both nostrils as needed.     glipiZIDE (GLUCOTROL) 5 MG tablet Take 5 mg by mouth daily.     insulin aspart (NOVOLOG) 100 UNIT/ML injection Inject 0-9 Units  into the skin 3 (three) times daily with meals. CBG < 70: implement hypoglycemia protocol CBG 70 - 120: 0 units CBG 121 - 150: 1 unit CBG 151 - 200: 2 units CBG 201 - 250: 3 units CBG 251 - 300: 5 units CBG 301 - 350: 7 units CBG 351 - 400: 9 units CBG > 400: call MD. (Patient not taking: Reported on 09/26/2018)     loratadine (CLARITIN) 10 MG tablet Take 10 mg by mouth daily.     Multiple Vitamin (MULTIVITAMIN) capsule Take 1 capsule by mouth  daily.       nicotine (NICODERM CQ - DOSED IN MG/24 HOURS) 21 mg/24hr patch Place 1 patch (21 mg total) onto the skin daily. (Patient not taking: Reported on 09/26/2018) 28 patch 0   Nutritional Supplements (FEEDING SUPPLEMENT, NEPRO CARB STEADY,) LIQD Take 237 mLs by mouth 2 (two) times daily between meals. (Patient not taking: Reported on 09/26/2018)     ondansetron (ZOFRAN) 4 MG tablet Take 4 mg by mouth every 6 (six) hours as needed for nausea/vomiting.     polyethylene glycol (MIRALAX / GLYCOLAX) packet Take 17 g by mouth daily.     pravastatin (PRAVACHOL) 10 MG tablet Take 1 tablet (10 mg total) by mouth daily.     sodium bicarbonate 650 MG tablet Take 2 tablets (1,300 mg total) by mouth 2 (two) times daily. (Patient not taking: Reported on 09/26/2018)     vitamin B-12 1000 MCG tablet Take 1 tablet (1,000 mcg total) by mouth daily.     zoledronic acid (RECLAST) 5 MG/100ML SOLN injection Inject 5 mg into the vein See admin instructions. Once a year     No facility-administered medications prior to visit.     PAST MEDICAL HISTORY: Past Medical History:  Diagnosis Date   ABDOMINAL AORTIC ANEURYSM    Atrial fibrillation (HCC)    CAROTID STENOSIS    CKD (chronic kidney disease), stage V (HCC)    Confusion    CVD (cardiovascular disease)    Diabetes (HCC)    FIBRILLATION, ATRIAL    HYPERCHOLESTEROLEMIA  IIA    HYPERTENSION, MILD    MURMUR    Osteoporosis    PERIPHERAL VASCULAR DISEASE    STENOSIS, MITRAL AND AORTIC VALVES     PAST SURGICAL HISTORY: Past Surgical History:  Procedure Laterality Date   AORTIC VALVE REPLACEMENT     TOTAL ABDOMINAL HYSTERECTOMY      FAMILY HISTORY: Family History  Problem Relation Age of Onset   Colon polyps Other    Heart disease Mother    Heart disease Father    Heart disease Brother     SOCIAL HISTORY: Social History   Socioeconomic History   Marital status: Widowed    Spouse name: Not on file   Number of  children: 0   Years of education: Not on file   Highest education level: 9th grade  Occupational History   Not on file  Social Needs   Financial resource strain: Not on file   Food insecurity:    Worry: Not on file    Inability: Not on file   Transportation needs:    Medical: Not on file    Non-medical: Not on file  Tobacco Use   Smoking status: Current Every Day Smoker    Packs/day: 1.00   Smokeless tobacco: Never Used  Substance and Sexual Activity   Alcohol use: No   Drug use: Never   Sexual activity: Not on file  Lifestyle  Physical activity:    Days per week: Not on file    Minutes per session: Not on file   Stress: Not on file  Relationships   Social connections:    Talks on phone: Not on file    Gets together: Not on file    Attends religious service: Not on file    Active member of club or organization: Not on file    Attends meetings of clubs or organizations: Not on file    Relationship status: Not on file   Intimate partner violence:    Fear of current or ex partner: Not on file    Emotionally abused: Not on file    Physically abused: Not on file    Forced sexual activity: Not on file  Other Topics Concern   Not on file  Social History Narrative   09/26/18 living with sister, [name]   Caffeine- coffee 1 cup day     PHYSICAL EXAM  Video exam  GENERAL EXAM/CONSTITUTIONAL:  Vitals: There were no vitals filed for this visit.  There is no height or weight on file to calculate BMI. Wt Readings from Last 3 Encounters:  08/30/18 123 lb 14.4 oz (56.2 kg)  07/18/18 112 lb (50.8 kg)  07/12/18 112 lb (50.8 kg)     Patient is in no distress; well developed, nourished and groomed; neck is supple   NEUROLOGIC: MENTAL STATUS:  MMSE - Mini Mental State Exam 10/01/2018  Orientation to time 5  Orientation to Place 5  Registration 3  Attention/ Calculation 2  Recall 3  Language- name 2 objects 2  Language- repeat 1  Language- follow 3  step command 3  Language- read & follow direction 1  Write a sentence 1  Copy design 0  Total score 26    awake, alert, oriented to person, place and time  recent and remote memory intact  normal attention and concentration  DECR FLUENCY; comprehension intact, naming intact  fund of knowledge appropriate  SEVERELY HARD OF HEARING  POOR INSIGHT   DIAGNOSTIC DATA (LABS, IMAGING, TESTING) - I reviewed patient records, labs, notes, testing and imaging myself where available.  Lab Results  Component Value Date   WBC 6.6 08/28/2018   HGB 8.8 (L) 08/28/2018   HCT 27.2 (L) 08/28/2018   MCV 89.2 08/28/2018   PLT 190 08/28/2018      Component Value Date/Time   NA 138 08/31/2018 0509   K 3.9 08/31/2018 0509   CL 101 08/31/2018 0509   CO2 24 08/31/2018 0509   GLUCOSE 156 (H) 08/31/2018 0509   BUN 67 (H) 08/31/2018 0509   CREATININE 5.33 (H) 08/31/2018 0509   CALCIUM 9.6 08/31/2018 0509   PROT 6.3 (L) 08/08/2018 1753   ALBUMIN 2.9 (L) 08/31/2018 0509   AST 11 (L) 08/08/2018 1753   ALT 6 08/08/2018 1753   ALKPHOS 59 08/08/2018 1753   BILITOT 0.4 08/08/2018 1753   GFRNONAA 7 (L) 08/31/2018 0509   GFRAA 9 (L) 08/31/2018 0509   No results found for: CHOL, HDL, LDLCALC, LDLDIRECT, TRIG, CHOLHDL Lab Results  Component Value Date   HGBA1C 6.5 (H) 08/20/2018   Lab Results  Component Value Date   VITAMINB12 117 (L) 08/09/2018   Lab Results  Component Value Date   TSH 3.464 08/20/2018    08/27/18 MRI brain [I reviewed images myself and agree with interpretation. -VRP]  1. No acute intracranial abnormality. 2. Moderate to severe chronic small vessel ischemic disease with chronic lacunar infarcts  as above. 3. Right larger than left mastoid and right middle ear effusions.   ASSESSMENT AND PLAN  73 y.o. year old female here with hypertension, diabetes, atrial fibrillation, aortic and mitral valve stenosis, chronic kidney disease, tobacco abuse, with progressive failure  to thrive and acute renal failure, metabolic encephalopathy in February 2020.  Patient had delirium while hospitalized which has incompletely resolved.  May represent underlying neurodegenerative or vascular dementia.  Also was found to have severe vitamin B-12 deficiency.  Dx: confusion, memory loss (? Mild dementia; vascular vs AD; B12 deficiency)  1. Memory loss     Virtual Visit via Video Note  I connected with [name] on 10/01/18 at  4:00 PM EDT by a video enabled telemedicine application and verified that I am speaking with the correct person using two identifiers.   I discussed the limitations of evaluation and management by telemedicine and the availability of in person appointments. The patient expressed understanding and agreed to proceed.  I discussed the assessment and treatment plan with the patient. The patient was provided an opportunity to ask questions and all were answered. The patient agreed with the plan and demonstrated an understanding of the instructions.   The patient was advised to call back or seek an in-person evaluation if the symptoms worsen or if the condition fails to improve as anticipated.  I provided 25 minutes of non-face-to-face time during this encounter.   PLAN:  MEMORY LOSS / CONFUSION - Continue supportive care with assistance currently from sister; may consider home health aids vs assisted living options - Likely needs 24-hour supervision currently unless she dramatically improves - Patient has poor insight, severe hearing loss, frailty which would make living alone almost impossible in my medical opinion  Return for pending if symptoms worsen or fail to improve.    Penni Bombard, MD 7/82/4235, 3:61 PM Certified in Neurology, Neurophysiology and Neuroimaging  [facility] [address], [place], [place] [address] [phone]

## 2018-10-02 DIAGNOSIS — Z681 Body mass index (BMI) 19 or less, adult: Secondary | ICD-10-CM | POA: Diagnosis not present

## 2018-10-02 DIAGNOSIS — E46 Unspecified protein-calorie malnutrition: Secondary | ICD-10-CM | POA: Diagnosis not present

## 2018-10-02 DIAGNOSIS — D519 Vitamin B12 deficiency anemia, unspecified: Secondary | ICD-10-CM | POA: Diagnosis not present

## 2018-10-02 DIAGNOSIS — E1122 Type 2 diabetes mellitus with diabetic chronic kidney disease: Secondary | ICD-10-CM | POA: Diagnosis not present

## 2018-10-02 DIAGNOSIS — N183 Chronic kidney disease, stage 3 (moderate): Secondary | ICD-10-CM | POA: Diagnosis not present

## 2018-10-02 DIAGNOSIS — I129 Hypertensive chronic kidney disease with stage 1 through stage 4 chronic kidney disease, or unspecified chronic kidney disease: Secondary | ICD-10-CM | POA: Diagnosis not present

## 2018-10-02 DIAGNOSIS — R410 Disorientation, unspecified: Secondary | ICD-10-CM | POA: Diagnosis not present

## 2018-10-02 DIAGNOSIS — D631 Anemia in chronic kidney disease: Secondary | ICD-10-CM | POA: Diagnosis not present

## 2018-10-02 DIAGNOSIS — I6529 Occlusion and stenosis of unspecified carotid artery: Secondary | ICD-10-CM | POA: Diagnosis not present

## 2018-10-02 DIAGNOSIS — N170 Acute kidney failure with tubular necrosis: Secondary | ICD-10-CM | POA: Diagnosis not present

## 2018-10-24 DIAGNOSIS — N179 Acute kidney failure, unspecified: Secondary | ICD-10-CM | POA: Diagnosis not present

## 2018-10-24 DIAGNOSIS — N189 Chronic kidney disease, unspecified: Secondary | ICD-10-CM | POA: Diagnosis not present

## 2018-10-24 DIAGNOSIS — I129 Hypertensive chronic kidney disease with stage 1 through stage 4 chronic kidney disease, or unspecified chronic kidney disease: Secondary | ICD-10-CM | POA: Diagnosis not present

## 2018-10-24 DIAGNOSIS — N183 Chronic kidney disease, stage 3 (moderate): Secondary | ICD-10-CM | POA: Diagnosis not present

## 2018-10-24 DIAGNOSIS — D631 Anemia in chronic kidney disease: Secondary | ICD-10-CM | POA: Diagnosis not present

## 2018-10-24 DIAGNOSIS — N2581 Secondary hyperparathyroidism of renal origin: Secondary | ICD-10-CM | POA: Diagnosis not present

## 2018-10-24 DIAGNOSIS — E1122 Type 2 diabetes mellitus with diabetic chronic kidney disease: Secondary | ICD-10-CM | POA: Diagnosis not present

## 2018-11-07 DIAGNOSIS — I10 Essential (primary) hypertension: Secondary | ICD-10-CM | POA: Diagnosis not present

## 2018-11-07 DIAGNOSIS — E1159 Type 2 diabetes mellitus with other circulatory complications: Secondary | ICD-10-CM | POA: Diagnosis not present

## 2018-11-07 DIAGNOSIS — R4182 Altered mental status, unspecified: Secondary | ICD-10-CM | POA: Diagnosis not present

## 2018-11-07 DIAGNOSIS — N179 Acute kidney failure, unspecified: Secondary | ICD-10-CM | POA: Diagnosis not present

## 2018-11-07 DIAGNOSIS — H9201 Otalgia, right ear: Secondary | ICD-10-CM | POA: Diagnosis not present

## 2018-11-15 DIAGNOSIS — I119 Hypertensive heart disease without heart failure: Secondary | ICD-10-CM | POA: Diagnosis not present

## 2018-11-15 DIAGNOSIS — R69 Illness, unspecified: Secondary | ICD-10-CM | POA: Diagnosis not present

## 2018-11-15 DIAGNOSIS — R4182 Altered mental status, unspecified: Secondary | ICD-10-CM | POA: Diagnosis not present

## 2018-11-15 DIAGNOSIS — E1159 Type 2 diabetes mellitus with other circulatory complications: Secondary | ICD-10-CM | POA: Diagnosis not present

## 2018-12-06 ENCOUNTER — Ambulatory Visit (INDEPENDENT_AMBULATORY_CARE_PROVIDER_SITE_OTHER): Payer: Medicare HMO | Admitting: Otolaryngology

## 2018-12-06 DIAGNOSIS — H6983 Other specified disorders of Eustachian tube, bilateral: Secondary | ICD-10-CM

## 2018-12-06 DIAGNOSIS — J310 Chronic rhinitis: Secondary | ICD-10-CM | POA: Diagnosis not present

## 2018-12-06 DIAGNOSIS — J343 Hypertrophy of nasal turbinates: Secondary | ICD-10-CM

## 2018-12-06 DIAGNOSIS — H906 Mixed conductive and sensorineural hearing loss, bilateral: Secondary | ICD-10-CM | POA: Diagnosis not present

## 2019-01-14 ENCOUNTER — Ambulatory Visit (INDEPENDENT_AMBULATORY_CARE_PROVIDER_SITE_OTHER): Payer: Medicare HMO | Admitting: Otolaryngology

## 2019-01-14 DIAGNOSIS — H906 Mixed conductive and sensorineural hearing loss, bilateral: Secondary | ICD-10-CM | POA: Diagnosis not present

## 2019-01-14 DIAGNOSIS — H6521 Chronic serous otitis media, right ear: Secondary | ICD-10-CM

## 2019-01-14 DIAGNOSIS — H6983 Other specified disorders of Eustachian tube, bilateral: Secondary | ICD-10-CM | POA: Diagnosis not present

## 2019-01-15 DIAGNOSIS — N183 Chronic kidney disease, stage 3 (moderate): Secondary | ICD-10-CM | POA: Diagnosis not present

## 2019-01-17 DIAGNOSIS — N2581 Secondary hyperparathyroidism of renal origin: Secondary | ICD-10-CM | POA: Diagnosis not present

## 2019-01-17 DIAGNOSIS — N184 Chronic kidney disease, stage 4 (severe): Secondary | ICD-10-CM | POA: Diagnosis not present

## 2019-01-17 DIAGNOSIS — E1122 Type 2 diabetes mellitus with diabetic chronic kidney disease: Secondary | ICD-10-CM | POA: Diagnosis not present

## 2019-01-17 DIAGNOSIS — N189 Chronic kidney disease, unspecified: Secondary | ICD-10-CM | POA: Diagnosis not present

## 2019-01-17 DIAGNOSIS — D631 Anemia in chronic kidney disease: Secondary | ICD-10-CM | POA: Diagnosis not present

## 2019-01-17 DIAGNOSIS — N179 Acute kidney failure, unspecified: Secondary | ICD-10-CM | POA: Diagnosis not present

## 2019-01-22 ENCOUNTER — Other Ambulatory Visit: Payer: Self-pay | Admitting: Otolaryngology

## 2019-02-25 ENCOUNTER — Other Ambulatory Visit: Payer: Self-pay

## 2019-02-25 ENCOUNTER — Encounter (HOSPITAL_BASED_OUTPATIENT_CLINIC_OR_DEPARTMENT_OTHER): Payer: Self-pay | Admitting: *Deleted

## 2019-02-26 ENCOUNTER — Encounter (HOSPITAL_BASED_OUTPATIENT_CLINIC_OR_DEPARTMENT_OTHER)
Admission: RE | Admit: 2019-02-26 | Discharge: 2019-02-26 | Disposition: A | Discharge status: Home / Self Care | Payer: Medicare HMO | Source: Ambulatory Visit | Attending: Otolaryngology | Admitting: Otolaryngology

## 2019-02-26 ENCOUNTER — Other Ambulatory Visit (HOSPITAL_COMMUNITY)
Admission: RE | Admit: 2019-02-26 | Discharge: 2019-02-26 | Disposition: A | Discharge status: Home / Self Care | Payer: Medicare HMO | Source: Ambulatory Visit | Attending: Otolaryngology | Admitting: Otolaryngology

## 2019-02-26 DIAGNOSIS — E785 Hyperlipidemia, unspecified: Secondary | ICD-10-CM | POA: Diagnosis not present

## 2019-02-26 DIAGNOSIS — Z952 Presence of prosthetic heart valve: Secondary | ICD-10-CM | POA: Diagnosis not present

## 2019-02-26 DIAGNOSIS — Z885 Allergy status to narcotic agent status: Secondary | ICD-10-CM | POA: Diagnosis not present

## 2019-02-26 DIAGNOSIS — R69 Illness, unspecified: Secondary | ICD-10-CM | POA: Diagnosis not present

## 2019-02-26 DIAGNOSIS — H6983 Other specified disorders of Eustachian tube, bilateral: Secondary | ICD-10-CM | POA: Diagnosis present

## 2019-02-26 DIAGNOSIS — Z20828 Contact with and (suspected) exposure to other viral communicable diseases: Secondary | ICD-10-CM | POA: Insufficient documentation

## 2019-02-26 DIAGNOSIS — Z882 Allergy status to sulfonamides status: Secondary | ICD-10-CM | POA: Diagnosis not present

## 2019-02-26 DIAGNOSIS — H6591 Unspecified nonsuppurative otitis media, right ear: Secondary | ICD-10-CM | POA: Diagnosis not present

## 2019-02-26 DIAGNOSIS — E1151 Type 2 diabetes mellitus with diabetic peripheral angiopathy without gangrene: Secondary | ICD-10-CM | POA: Diagnosis not present

## 2019-02-26 DIAGNOSIS — F17200 Nicotine dependence, unspecified, uncomplicated: Secondary | ICD-10-CM | POA: Diagnosis not present

## 2019-02-26 DIAGNOSIS — E1122 Type 2 diabetes mellitus with diabetic chronic kidney disease: Secondary | ICD-10-CM | POA: Diagnosis not present

## 2019-02-26 DIAGNOSIS — Z01812 Encounter for preprocedural laboratory examination: Secondary | ICD-10-CM | POA: Diagnosis not present

## 2019-02-26 DIAGNOSIS — N185 Chronic kidney disease, stage 5: Secondary | ICD-10-CM | POA: Diagnosis not present

## 2019-02-26 DIAGNOSIS — E7800 Pure hypercholesterolemia, unspecified: Secondary | ICD-10-CM | POA: Diagnosis not present

## 2019-02-26 DIAGNOSIS — Z880 Allergy status to penicillin: Secondary | ICD-10-CM | POA: Diagnosis not present

## 2019-02-26 DIAGNOSIS — I120 Hypertensive chronic kidney disease with stage 5 chronic kidney disease or end stage renal disease: Secondary | ICD-10-CM | POA: Diagnosis not present

## 2019-02-26 DIAGNOSIS — H906 Mixed conductive and sensorineural hearing loss, bilateral: Secondary | ICD-10-CM | POA: Diagnosis not present

## 2019-02-26 LAB — BASIC METABOLIC PANEL
Anion gap: 13 (ref 5–15)
BUN: 28 mg/dL — ABNORMAL HIGH (ref 8–23)
CO2: 21 mmol/L — ABNORMAL LOW (ref 22–32)
Calcium: 9.4 mg/dL (ref 8.9–10.3)
Chloride: 103 mmol/L (ref 98–111)
Creatinine, Ser: 2.41 mg/dL — ABNORMAL HIGH (ref 0.44–1.00)
GFR calc Af Amer: 22 mL/min — ABNORMAL LOW (ref >60)
GFR calc non Af Amer: 19 mL/min — ABNORMAL LOW (ref >60)
Glucose, Bld: 174 mg/dL — ABNORMAL HIGH (ref 70–99)
Potassium: 4.6 mmol/L (ref 3.5–5.1)
Sodium: 137 mmol/L (ref 135–145)

## 2019-02-26 NOTE — Progress Notes (Signed)
Labs reviewed with Dr. Daiva Huge, will proceed with scheduled surgery.

## 2019-02-27 LAB — NOVEL CORONAVIRUS, NAA (HOSP ORDER, SEND-OUT TO REF LAB; TAT 18-24 HRS): SARS-CoV-2, NAA: NOT DETECTED

## 2019-03-01 ENCOUNTER — Ambulatory Visit (HOSPITAL_BASED_OUTPATIENT_CLINIC_OR_DEPARTMENT_OTHER)
Admission: RE | Admit: 2019-03-01 | Discharge: 2019-03-01 | Disposition: A | Discharge status: Home / Self Care | Payer: Medicare HMO | Attending: Otolaryngology | Admitting: Otolaryngology

## 2019-03-01 ENCOUNTER — Other Ambulatory Visit: Payer: Self-pay

## 2019-03-01 ENCOUNTER — Ambulatory Visit (HOSPITAL_BASED_OUTPATIENT_CLINIC_OR_DEPARTMENT_OTHER): Payer: Medicare HMO | Admitting: Anesthesiology

## 2019-03-01 ENCOUNTER — Encounter (HOSPITAL_BASED_OUTPATIENT_CLINIC_OR_DEPARTMENT_OTHER): Payer: Self-pay | Admitting: *Deleted

## 2019-03-01 ENCOUNTER — Encounter (HOSPITAL_BASED_OUTPATIENT_CLINIC_OR_DEPARTMENT_OTHER)
Admission: RE | Disposition: A | Discharge status: Home / Self Care | Payer: Self-pay | Source: Home / Self Care | Attending: Otolaryngology

## 2019-03-01 DIAGNOSIS — I120 Hypertensive chronic kidney disease with stage 5 chronic kidney disease or end stage renal disease: Secondary | ICD-10-CM | POA: Insufficient documentation

## 2019-03-01 DIAGNOSIS — E1151 Type 2 diabetes mellitus with diabetic peripheral angiopathy without gangrene: Secondary | ICD-10-CM | POA: Diagnosis not present

## 2019-03-01 DIAGNOSIS — Z952 Presence of prosthetic heart valve: Secondary | ICD-10-CM | POA: Insufficient documentation

## 2019-03-01 DIAGNOSIS — R69 Illness, unspecified: Secondary | ICD-10-CM | POA: Diagnosis not present

## 2019-03-01 DIAGNOSIS — E785 Hyperlipidemia, unspecified: Secondary | ICD-10-CM | POA: Insufficient documentation

## 2019-03-01 DIAGNOSIS — H6591 Unspecified nonsuppurative otitis media, right ear: Secondary | ICD-10-CM | POA: Diagnosis not present

## 2019-03-01 DIAGNOSIS — H6983 Other specified disorders of Eustachian tube, bilateral: Secondary | ICD-10-CM | POA: Diagnosis not present

## 2019-03-01 DIAGNOSIS — F17200 Nicotine dependence, unspecified, uncomplicated: Secondary | ICD-10-CM | POA: Diagnosis not present

## 2019-03-01 DIAGNOSIS — N185 Chronic kidney disease, stage 5: Secondary | ICD-10-CM | POA: Insufficient documentation

## 2019-03-01 DIAGNOSIS — H906 Mixed conductive and sensorineural hearing loss, bilateral: Secondary | ICD-10-CM | POA: Insufficient documentation

## 2019-03-01 DIAGNOSIS — E1122 Type 2 diabetes mellitus with diabetic chronic kidney disease: Secondary | ICD-10-CM | POA: Diagnosis not present

## 2019-03-01 DIAGNOSIS — H73893 Other specified disorders of tympanic membrane, bilateral: Secondary | ICD-10-CM | POA: Diagnosis not present

## 2019-03-01 DIAGNOSIS — Z880 Allergy status to penicillin: Secondary | ICD-10-CM | POA: Insufficient documentation

## 2019-03-01 DIAGNOSIS — H900 Conductive hearing loss, bilateral: Secondary | ICD-10-CM | POA: Diagnosis not present

## 2019-03-01 DIAGNOSIS — Z882 Allergy status to sulfonamides status: Secondary | ICD-10-CM | POA: Insufficient documentation

## 2019-03-01 DIAGNOSIS — E7800 Pure hypercholesterolemia, unspecified: Secondary | ICD-10-CM | POA: Diagnosis not present

## 2019-03-01 DIAGNOSIS — H748X3 Other specified disorders of middle ear and mastoid, bilateral: Secondary | ICD-10-CM | POA: Diagnosis not present

## 2019-03-01 DIAGNOSIS — Z885 Allergy status to narcotic agent status: Secondary | ICD-10-CM | POA: Insufficient documentation

## 2019-03-01 HISTORY — DX: Nausea with vomiting, unspecified: R11.2

## 2019-03-01 HISTORY — DX: Nausea with vomiting, unspecified: Z98.890

## 2019-03-01 HISTORY — DX: Other complications of anesthesia, initial encounter: T88.59XA

## 2019-03-01 HISTORY — PX: MYRINGOTOMY WITH TUBE PLACEMENT: SHX5663

## 2019-03-01 HISTORY — DX: Other specified postprocedural states: Z98.890

## 2019-03-01 SURGERY — MYRINGOTOMY WITH TUBE PLACEMENT
Anesthesia: General | Site: Ear | Laterality: Bilateral

## 2019-03-01 MED ORDER — LIDOCAINE 2% (20 MG/ML) 5 ML SYRINGE
INTRAMUSCULAR | Status: DC | PRN
  Administered 2019-03-01 (×2): 40 mg via INTRAVENOUS

## 2019-03-01 MED ORDER — ONDANSETRON HCL 4 MG/2ML IJ SOLN: 4.0000 mg | Freq: Once | INTRAMUSCULAR | Status: DC | PRN

## 2019-03-01 MED ORDER — CIPROFLOXACIN-FLUOCINOLONE PF 0.3-0.025 % OT SOLN
OTIC | Status: DC | PRN
  Administered 2019-03-01: 1.5 mL via OTIC

## 2019-03-01 MED ORDER — FENTANYL CITRATE (PF) 100 MCG/2ML IJ SOLN: 25.0000 ug | INTRAMUSCULAR | Status: DC | PRN

## 2019-03-01 MED ORDER — OXYCODONE HCL 5 MG/5ML PO SOLN: 5.0000 mg | Freq: Once | ORAL | Status: DC | PRN

## 2019-03-01 MED ORDER — LACTATED RINGERS IV SOLN
INTRAVENOUS | Status: DC
  Administered 2019-03-01: 07:00:00 via INTRAVENOUS

## 2019-03-01 MED ORDER — SCOPOLAMINE 1 MG/3DAYS TD PT72: 1.0000 | MEDICATED_PATCH | Freq: Once | TRANSDERMAL | Status: DC

## 2019-03-01 MED ORDER — MEPERIDINE HCL 25 MG/ML IJ SOLN: 6.2500 mg | INTRAMUSCULAR | Status: DC | PRN

## 2019-03-01 MED ORDER — MIDAZOLAM HCL 2 MG/2ML IJ SOLN: 1.0000 mg | INTRAMUSCULAR | Status: DC | PRN

## 2019-03-01 MED ORDER — OXYCODONE HCL 5 MG PO TABS: 5.0000 mg | ORAL_TABLET | Freq: Once | ORAL | Status: DC | PRN

## 2019-03-01 MED ORDER — ACETAMINOPHEN 160 MG/5ML PO SOLN: 325.0000 mg | ORAL | Status: DC | PRN

## 2019-03-01 MED ORDER — ACETAMINOPHEN 325 MG PO TABS: 325.0000 mg | ORAL_TABLET | ORAL | Status: DC | PRN

## 2019-03-01 MED ORDER — LIDOCAINE 2% (20 MG/ML) 5 ML SYRINGE
INTRAMUSCULAR | Status: AC
  Filled 2019-03-01: qty 5

## 2019-03-01 MED ORDER — FENTANYL CITRATE (PF) 100 MCG/2ML IJ SOLN: 50.0000 ug | INTRAMUSCULAR | Status: DC | PRN

## 2019-03-01 MED ORDER — PROPOFOL 10 MG/ML IV BOLUS
INTRAVENOUS | Status: AC
  Filled 2019-03-01: qty 20

## 2019-03-01 MED ORDER — PROPOFOL 10 MG/ML IV BOLUS
INTRAVENOUS | Status: DC | PRN
  Administered 2019-03-01: 10 mg via INTRAVENOUS
  Administered 2019-03-01 (×5): 20 mg via INTRAVENOUS

## 2019-03-01 SURGICAL SUPPLY — 11 items
BALL CTTN LRG ABS STRL LF (GAUZE/BANDAGES/DRESSINGS) ×1
BLADE MYRINGOTOMY 45DEG STRL (BLADE) ×2 IMPLANT
CANISTER SUCT 1200ML W/VALVE (MISCELLANEOUS) ×2 IMPLANT
COTTONBALL LRG STERILE PKG (GAUZE/BANDAGES/DRESSINGS) ×2 IMPLANT
GAUZE SPONGE 4X4 12PLY STRL LF (GAUZE/BANDAGES/DRESSINGS) IMPLANT
IV SET EXT 30 76VOL 4 MALE LL (IV SETS) ×2 IMPLANT
NS IRRIG 1000ML POUR BTL (IV SOLUTION) IMPLANT
TOWEL GREEN STERILE FF (TOWEL DISPOSABLE) ×2 IMPLANT
TUBE CONNECTING 20X1/4 (TUBING) ×2 IMPLANT
TUBE EAR SHEEHY BUTTON 1.27 (OTOLOGIC RELATED) ×2 IMPLANT
TUBE EAR T MOD 1.32X4.8 BL (OTOLOGIC RELATED) ×2 IMPLANT

## 2019-03-01 NOTE — Anesthesia Postprocedure Evaluation (Signed)
Anesthesia Post Note  Patient: Sandra Sheppard  Procedure(s) Performed: MYRINGOTOMY WITH  T TUBE PLACEMENT (Bilateral Ear)     Patient location during evaluation: PACU Anesthesia Type: General Level of consciousness: awake and alert Pain management: pain level controlled Vital Signs Assessment: post-procedure vital signs reviewed and stable Respiratory status: spontaneous breathing, nonlabored ventilation, respiratory function stable and patient connected to nasal cannula oxygen Cardiovascular status: blood pressure returned to baseline and stable Postop Assessment: no apparent nausea or vomiting Anesthetic complications: no    Last Vitals:  Vitals:   03/01/19 0815 03/01/19 0834  BP: (!) 126/54 (!) 143/68  Pulse: 65 72  Resp: 18 18  Temp:  36.9 C  SpO2: 100% 100%    Last Pain:  Vitals:   03/01/19 0834  PainSc: 0-No pain                 Annalyse Langlais

## 2019-03-01 NOTE — H&P (Signed)
Cc: Middle ear effusion, hearing loss  HPI: The patient is a 73 y/o female who returns today with her sister for follow up evaluation of right ear hearing loss. The patient was last seen 6 weeks ago. At that time, the patient was noted to have a right middle ear effusion with the left TM retracted. Bilateral mixed hearing loss was noted. Nasal endoscopy showed no mass or lesion but nasal mucosal congestion with right septal deviation and bilateral inferior turbinate hypertrophy. The patient was placed on daily Flonase and encouraged to perform the valsalva exercise. The patient returns today noting no improvement in her hearing. She still feels a lot of pressure in her right ear. The patient has noted no benefit from using Flonase. No other ENT, GI, or respiratory issue noted since the last visit.   Exam: General: Communicates without difficulty, well nourished, no acute distress. Head: Normocephalic, no evidence injury, no tenderness, facial buttresses intact without stepoff. Eyes: PERRL, EOMI. No scleral icterus, conjunctivae clear. Neuro: CN II exam reveals vision grossly intact. No nystagmus at any point of gaze. Ears: Auricles well formed without lesions. Ear canals are intact without mass or lesion. No erythema or edema is appreciated. The TMs are intact with fluid on the right. Left TM is retracted. Nose: External evaluation reveals normal support and skin without lesions. Dorsum is intact. Anterior rhinoscopy reveals congested mucosa over anterior aspect of inferior turbinates and intact septum. No purulence noted. Oral:  Oral cavity and oropharynx are intact, symmetric, without erythema or edema. Mucosa is moist without lesions. Neck: Full range of motion without pain. There is no significant lymphadenopathy. No masses palpable. Thyroid bed within normal limits to palpation. Parotid glands and submandibular glands equal bilaterally without mass. Trachea is midline. Neuro:  CN 2-12 grossly intact. Gait  normal. Vestibular: No nystagmus at any point of gaze. The cerebellar examination is unremarkable.   AUDIOMETRIC TESTING: I have read and reviewed the audiometric test, which shows bilateral mixed hearing loss. The speech reception threshold is 70dB AD and 55dB AS. The discrimination score is 84% AD and 84% AS. The tympanogram is flat on the right with negative pressure on the left.   Assessment 1. The patient is noted to have a persistent right middle ear effusion and left TM retraction, with associated conductive hearing loss.  2. Bilateral eustachian tube dysfunction.   Plan  1. The physical exam and hearing test findings are reviewed with the patient and her family. 2. The treatment options include continuing conservative observation with nasal steroid spray versus myringotomy and tube placement.  The risks, benefits, and details of the treatment options are discussed.  Questions are invited and answered.   3. The patient would like to proceed with bilateral myringotomy with T-tube placement. This will be scheduled in accordance with the family's schedule.

## 2019-03-01 NOTE — Op Note (Signed)
DATE OF PROCEDURE:  03/01/2019                              OPERATIVE REPORT  SURGEON:  Leta Baptist, MD  PREOPERATIVE DIAGNOSES: 1. Bilateral eustachian tube dysfunction. 2. Bilateral conductive hearing loss. 3. Bilateral TM retraction and middle ear effusion.  POSTOPERATIVE DIAGNOSES: 1. Bilateral eustachian tube dysfunction. 2. Bilateral conductive hearing loss. 3. Bilateral TM retraction and middle ear effusion.  PROCEDURE PERFORMED: 1) Bilateral myringotomy and tube placement.          ANESTHESIA:  IV sedation.  COMPLICATIONS:  None.  ESTIMATED BLOOD LOSS:  Minimal.  INDICATION FOR PROCEDURE:   Sandra Sheppard is a 73 y.o. female with a history of eustachian tube dysfunction.Marland Kitchen  Despite medical treatment, the patient continued to be symptomatic.  On examination, the patient was noted to have TM retraction bilaterally, with significant conductive hearing loss.  Based on the above findings, the decision was made for the patient to undergo the myringotomy and tube placement procedure. Likelihood of success in reducing symptoms was also discussed.  The risks, benefits, alternatives, and details of the procedure were discussed with the patient.  Questions were invited and answered.  Informed consent was obtained.  DESCRIPTION:  The patient was taken to the operating room and placed supine on the operating table.  General facemask anesthesia was administered by the anesthesiologist.  Under the operating microscope, the right ear canal was cleaned of all cerumen.  The tympanic membrane was noted to be intact but severely retracted.  A standard myringotomy incision was made at the anterior-inferior quadrant on the tympanic membrane.  A moderate amount of serous fluid was suctioned from behind the tympanic membrane. A T tube was placed, followed by antibiotic eardrops in the ear canal.  The same procedure was repeated on the left side without exception. The care of the patient was turned over to the  anesthesiologist.  The patient was awakened from anesthesia without difficulty.  The patient was transferred to the recovery room in good condition.  OPERATIVE FINDINGS:  A moderate amount of serous effusion was noted bilaterally.  SPECIMEN:  None.  FOLLOWUP CARE:  The patient will be placed on Otovel eardrops 1 vial each ear b.i.d..  The patient will follow up in my office in approximately 4 weeks.  Izetta Sakamoto WOOI 03/01/2019

## 2019-03-01 NOTE — Transfer of Care (Signed)
Immediate Anesthesia Transfer of Care Note  Patient: Sandra Sheppard  Procedure(s) Performed: MYRINGOTOMY WITH  T TUBE PLACEMENT (Bilateral Ear)  Patient Location: PACU  Anesthesia Type:MAC  Level of Consciousness: awake  Airway & Oxygen Therapy: Patient Spontanous Breathing and Patient connected to face mask oxygen  Post-op Assessment: Report given to RN and Post -op Vital signs reviewed and stable  Post vital signs: Reviewed and stable  Last Vitals:  Vitals Value Taken Time  BP    Temp    Pulse 62 03/01/19 0802  Resp 21 03/01/19 0802  SpO2 100 % 03/01/19 0802  Vitals shown include unvalidated device data.  Last Pain:  Vitals:   03/01/19 0709  PainSc: 0-No pain         Complications: No apparent anesthesia complications

## 2019-03-01 NOTE — Anesthesia Preprocedure Evaluation (Addendum)
Anesthesia Evaluation  Patient identified by MRN, date of birth, ID band Patient awake    Reviewed: Allergy & Precautions, NPO status , Patient's Chart, lab work & pertinent test results  History of Anesthesia Complications (+) PONV and history of anesthetic complications  Airway Mallampati: II  TM Distance: >3 FB Neck ROM: Full    Dental no notable dental hx. (+) Edentulous Upper, Edentulous Lower, Lower Dentures, Upper Dentures   Pulmonary neg pulmonary ROS, Current Smoker,    Pulmonary exam normal breath sounds clear to auscultation       Cardiovascular hypertension, Pt. on medications + Peripheral Vascular Disease  + dysrhythmias Atrial Fibrillation  Rhythm:Regular Rate:Normal  ECHO 16' Study Conclusions  - Left ventricle: The cavity size was normal. Wall thickness was   normal. Systolic function was normal. The estimated ejection   fraction was in the range of 60% to 65%. Wall motion was normal;   there were no regional wall motion abnormalities. Features are   consistent with a pseudonormal left ventricular filling pattern,   with concomitant abnormal relaxation and increased filling   pressure (grade 2 diastolic dysfunction). - Aortic valve: Moderately calcified annulus. - Left atrium: The atrium was moderately dilated.    Neuro/Psych PSYCHIATRIC DISORDERS Dopler 62' Final Interpretation: Right Carotid: Velocities in the right ICA are consistent with a 1-39% stenosis.  Left Carotid: Velocities in the left ICA are consistent with a 1-39% stenosis.               Non-hemodynamically significant plaque noted in the CCA.  Vertebrals:  Both vertebral arteries were patent with antegrade flow. Subclavians: Normal flow hemodynamics were seen in bilateral subclavian              arteries. negative neurological ROS     GI/Hepatic negative GI ROS, Neg liver ROS,   Endo/Other  diabetes, Type 2  Renal/GU CRFRenal  disease  negative genitourinary   Musculoskeletal negative musculoskeletal ROS (+)   Abdominal   Peds negative pediatric ROS (+)  Hematology negative hematology ROS (+) anemia ,   Anesthesia Other Findings   Reproductive/Obstetrics negative OB ROS                        Anesthesia Physical Anesthesia Plan  ASA: III  Anesthesia Plan: General   Post-op Pain Management:    Induction: Intravenous  PONV Risk Score and Plan: 3 and Ondansetron, Treatment may vary due to age or medical condition, Dexamethasone and TIVA  Airway Management Planned: LMA and Simple Face Mask  Additional Equipment:   Intra-op Plan:   Post-operative Plan:   Informed Consent:   Plan Discussed with: CRNA, Surgeon and Anesthesiologist  Anesthesia Plan Comments: ( )       Anesthesia Quick Evaluation

## 2019-03-01 NOTE — Anesthesia Procedure Notes (Signed)
Procedure Name: MAC Date/Time: 03/01/2019 7:55 AM Performed by: Lieutenant Diego, CRNA Pre-anesthesia Checklist: Patient identified, Emergency Drugs available, Suction available, Patient being monitored and Timeout performed Patient Re-evaluated:Patient Re-evaluated prior to induction Oxygen Delivery Method: Simple face mask Preoxygenation: Pre-oxygenation with 100% oxygen Induction Type: IV induction

## 2019-03-01 NOTE — Discharge Instructions (Addendum)
POSTOPERATIVE INSTRUCTIONS FOR PATIENTS HAVING MYRINGOTOMY AND TUBES ° °1. Please use the ear drops in each ear with a new tube as instructed. Use the drops as prescribed by your doctor, placing the drops into the outer opening of the ear canal with the head tilted to the opposite side. Place a clean piece of cotton into the ear after using drops. A small amount of blood tinged drainage is not uncommon for several days after the tubes are inserted. °2. Nausea and vomiting may be expected the first 6 hours after surgery. Offer liquids initially. If there is no nausea, small light meals are usually best tolerated the day of surgery. A normal diet may be resumed once nausea has passed. °3. The patient may experience mild ear discomfort the day of surgery, which is usually relieved by Tylenol. °4. A small amount of clear or blood-tinged drainage from the ears may occur a few days after surgery. If this should persists or become thick, green, yellow, or foul smelling, please contact our office at [phone]. °5. If you see clear, green, or yellow drainage from your child’s ear during colds, clean the outer ear gently with a soft, damp washcloth. Begin the prescribed ear drops (4 drops, twice a day) for one week, as previously instructed.  The drainage should stop within 48 hours after starting the ear drops. If the drainage continues or becomes yellow or green, please call our office. If your child develops a fever greater than 102 F, or has and persistent bleeding from the ear(s), please call us. °6. Try to avoid getting water in the ears. Swimming is permitted as long as there is no deep diving or swimming under water deeper than 3 feet. If you think water has gotten into the ear(s), either bathing or swimming, place 4 drops of the prescribed ear drops into the ear in question. We do recommend drops after swimming in the ocean, rivers, or lakes. °7. It is important for you to return for your scheduled appointment  so that the status of the tubes can be determined.  ° ° °Post Anesthesia Home Care Instructions ° °Activity: °Get plenty of rest for the remainder of the day. A responsible individual must stay with you for 24 hours following the procedure.  °For the next 24 hours, DO NOT: °-Drive a car °-Operate machinery °-Drink alcoholic beverages °-Take any medication unless instructed by your physician °-Make any legal decisions or sign important papers. ° °Meals: °Start with liquid foods such as gelatin or soup. Progress to regular foods as tolerated. Avoid greasy, spicy, heavy foods. If nausea and/or vomiting occur, drink only clear liquids until the nausea and/or vomiting subsides. Call your physician if vomiting continues. ° °Special Instructions/Symptoms: °Your throat may feel dry or sore from the anesthesia or the breathing tube placed in your throat during surgery. If this causes discomfort, gargle with warm salt water. The discomfort should disappear within 24 hours. ° °If you had a scopolamine patch placed behind your ear for the management of post- operative nausea and/or vomiting: ° °1. The medication in the patch is effective for 72 hours, after which it should be removed.  Wrap patch in a tissue and discard in the trash. Wash hands thoroughly with soap and water. °2. You may remove the patch earlier than 72 hours if you experience unpleasant side effects which may include dry mouth, dizziness or visual disturbances. °3. Avoid touching the patch. Wash your hands with soap and water after contact with the   patch. °   ° ° °

## 2019-03-02 DIAGNOSIS — H6983 Other specified disorders of Eustachian tube, bilateral: Secondary | ICD-10-CM | POA: Diagnosis not present

## 2019-03-02 DIAGNOSIS — H6523 Chronic serous otitis media, bilateral: Secondary | ICD-10-CM | POA: Diagnosis not present

## 2019-03-04 ENCOUNTER — Encounter (HOSPITAL_BASED_OUTPATIENT_CLINIC_OR_DEPARTMENT_OTHER): Payer: Self-pay | Admitting: Otolaryngology

## 2019-03-13 DIAGNOSIS — K5900 Constipation, unspecified: Secondary | ICD-10-CM | POA: Diagnosis not present

## 2019-03-13 DIAGNOSIS — H9201 Otalgia, right ear: Secondary | ICD-10-CM | POA: Diagnosis not present

## 2019-03-13 DIAGNOSIS — N184 Chronic kidney disease, stage 4 (severe): Secondary | ICD-10-CM | POA: Diagnosis not present

## 2019-03-13 DIAGNOSIS — L02429 Furuncle of limb, unspecified: Secondary | ICD-10-CM | POA: Diagnosis not present

## 2019-03-13 DIAGNOSIS — R69 Illness, unspecified: Secondary | ICD-10-CM | POA: Diagnosis not present

## 2019-03-13 DIAGNOSIS — I119 Hypertensive heart disease without heart failure: Secondary | ICD-10-CM | POA: Diagnosis not present

## 2019-03-13 DIAGNOSIS — E1159 Type 2 diabetes mellitus with other circulatory complications: Secondary | ICD-10-CM | POA: Diagnosis not present

## 2019-03-13 DIAGNOSIS — R4182 Altered mental status, unspecified: Secondary | ICD-10-CM | POA: Diagnosis not present

## 2019-03-13 DIAGNOSIS — M810 Age-related osteoporosis without current pathological fracture: Secondary | ICD-10-CM | POA: Diagnosis not present

## 2019-03-15 DIAGNOSIS — L02434 Carbuncle of left upper limb: Secondary | ICD-10-CM | POA: Diagnosis not present

## 2019-03-15 DIAGNOSIS — E1159 Type 2 diabetes mellitus with other circulatory complications: Secondary | ICD-10-CM | POA: Diagnosis not present

## 2019-03-15 DIAGNOSIS — N184 Chronic kidney disease, stage 4 (severe): Secondary | ICD-10-CM | POA: Diagnosis not present

## 2019-03-15 DIAGNOSIS — L02429 Furuncle of limb, unspecified: Secondary | ICD-10-CM | POA: Diagnosis not present

## 2019-03-20 DIAGNOSIS — N184 Chronic kidney disease, stage 4 (severe): Secondary | ICD-10-CM | POA: Diagnosis not present

## 2019-03-20 DIAGNOSIS — L02433 Carbuncle of right upper limb: Secondary | ICD-10-CM | POA: Diagnosis not present

## 2019-03-22 DIAGNOSIS — L02413 Cutaneous abscess of right upper limb: Secondary | ICD-10-CM | POA: Diagnosis not present

## 2019-03-22 DIAGNOSIS — L02419 Cutaneous abscess of limb, unspecified: Secondary | ICD-10-CM | POA: Diagnosis not present

## 2019-03-25 ENCOUNTER — Other Ambulatory Visit: Payer: Self-pay

## 2019-03-25 ENCOUNTER — Ambulatory Visit (INDEPENDENT_AMBULATORY_CARE_PROVIDER_SITE_OTHER): Payer: Medicare HMO | Admitting: Otolaryngology

## 2019-03-25 DIAGNOSIS — H903 Sensorineural hearing loss, bilateral: Secondary | ICD-10-CM | POA: Diagnosis not present

## 2019-03-25 DIAGNOSIS — H6983 Other specified disorders of Eustachian tube, bilateral: Secondary | ICD-10-CM

## 2019-03-25 DIAGNOSIS — H7203 Central perforation of tympanic membrane, bilateral: Secondary | ICD-10-CM

## 2019-04-17 DIAGNOSIS — I10 Essential (primary) hypertension: Secondary | ICD-10-CM | POA: Diagnosis not present

## 2019-04-17 DIAGNOSIS — Z880 Allergy status to penicillin: Secondary | ICD-10-CM | POA: Diagnosis not present

## 2019-04-17 DIAGNOSIS — Z7982 Long term (current) use of aspirin: Secondary | ICD-10-CM | POA: Diagnosis not present

## 2019-04-17 DIAGNOSIS — R69 Illness, unspecified: Secondary | ICD-10-CM | POA: Diagnosis not present

## 2019-04-17 DIAGNOSIS — Z882 Allergy status to sulfonamides status: Secondary | ICD-10-CM | POA: Diagnosis not present

## 2019-04-17 DIAGNOSIS — E785 Hyperlipidemia, unspecified: Secondary | ICD-10-CM | POA: Diagnosis not present

## 2019-04-17 DIAGNOSIS — Z008 Encounter for other general examination: Secondary | ICD-10-CM | POA: Diagnosis not present

## 2019-04-19 DIAGNOSIS — Z23 Encounter for immunization: Secondary | ICD-10-CM | POA: Diagnosis not present

## 2019-04-22 DIAGNOSIS — N2581 Secondary hyperparathyroidism of renal origin: Secondary | ICD-10-CM | POA: Diagnosis not present

## 2019-04-22 DIAGNOSIS — N189 Chronic kidney disease, unspecified: Secondary | ICD-10-CM | POA: Diagnosis not present

## 2019-04-22 DIAGNOSIS — E1122 Type 2 diabetes mellitus with diabetic chronic kidney disease: Secondary | ICD-10-CM | POA: Diagnosis not present

## 2019-04-22 DIAGNOSIS — D631 Anemia in chronic kidney disease: Secondary | ICD-10-CM | POA: Diagnosis not present

## 2019-04-22 DIAGNOSIS — N184 Chronic kidney disease, stage 4 (severe): Secondary | ICD-10-CM | POA: Diagnosis not present

## 2019-04-22 DIAGNOSIS — N179 Acute kidney failure, unspecified: Secondary | ICD-10-CM | POA: Diagnosis not present

## 2019-07-12 DIAGNOSIS — M79661 Pain in right lower leg: Secondary | ICD-10-CM | POA: Diagnosis not present

## 2019-07-12 DIAGNOSIS — S82891A Other fracture of right lower leg, initial encounter for closed fracture: Secondary | ICD-10-CM | POA: Diagnosis not present

## 2019-07-12 DIAGNOSIS — W19XXXA Unspecified fall, initial encounter: Secondary | ICD-10-CM | POA: Diagnosis not present

## 2019-07-12 DIAGNOSIS — S82831A Other fracture of upper and lower end of right fibula, initial encounter for closed fracture: Secondary | ICD-10-CM | POA: Diagnosis not present

## 2019-07-17 DIAGNOSIS — E1159 Type 2 diabetes mellitus with other circulatory complications: Secondary | ICD-10-CM | POA: Diagnosis not present

## 2019-07-17 DIAGNOSIS — E538 Deficiency of other specified B group vitamins: Secondary | ICD-10-CM | POA: Diagnosis not present

## 2019-07-17 DIAGNOSIS — Z0000 Encounter for general adult medical examination without abnormal findings: Secondary | ICD-10-CM | POA: Diagnosis not present

## 2019-07-17 DIAGNOSIS — E782 Mixed hyperlipidemia: Secondary | ICD-10-CM | POA: Diagnosis not present

## 2019-07-17 DIAGNOSIS — M810 Age-related osteoporosis without current pathological fracture: Secondary | ICD-10-CM | POA: Diagnosis not present

## 2019-07-19 DIAGNOSIS — S82831D Other fracture of upper and lower end of right fibula, subsequent encounter for closed fracture with routine healing: Secondary | ICD-10-CM | POA: Diagnosis not present

## 2019-07-24 DIAGNOSIS — Z952 Presence of prosthetic heart valve: Secondary | ICD-10-CM | POA: Diagnosis not present

## 2019-07-24 DIAGNOSIS — I480 Paroxysmal atrial fibrillation: Secondary | ICD-10-CM | POA: Diagnosis not present

## 2019-07-24 DIAGNOSIS — E1159 Type 2 diabetes mellitus with other circulatory complications: Secondary | ICD-10-CM | POA: Diagnosis not present

## 2019-07-24 DIAGNOSIS — I119 Hypertensive heart disease without heart failure: Secondary | ICD-10-CM | POA: Diagnosis not present

## 2019-07-24 DIAGNOSIS — I714 Abdominal aortic aneurysm, without rupture: Secondary | ICD-10-CM | POA: Diagnosis not present

## 2019-07-24 DIAGNOSIS — E782 Mixed hyperlipidemia: Secondary | ICD-10-CM | POA: Diagnosis not present

## 2019-07-24 DIAGNOSIS — Z0000 Encounter for general adult medical examination without abnormal findings: Secondary | ICD-10-CM | POA: Diagnosis not present

## 2019-07-24 DIAGNOSIS — N184 Chronic kidney disease, stage 4 (severe): Secondary | ICD-10-CM | POA: Diagnosis not present

## 2019-07-24 DIAGNOSIS — R4182 Altered mental status, unspecified: Secondary | ICD-10-CM | POA: Diagnosis not present

## 2019-07-24 DIAGNOSIS — I6529 Occlusion and stenosis of unspecified carotid artery: Secondary | ICD-10-CM | POA: Diagnosis not present

## 2019-07-24 DIAGNOSIS — Z1331 Encounter for screening for depression: Secondary | ICD-10-CM | POA: Diagnosis not present

## 2019-07-31 DIAGNOSIS — S82831D Other fracture of upper and lower end of right fibula, subsequent encounter for closed fracture with routine healing: Secondary | ICD-10-CM | POA: Diagnosis not present

## 2019-08-09 ENCOUNTER — Ambulatory Visit: Payer: Medicare HMO | Admitting: Cardiology

## 2019-08-11 ENCOUNTER — Ambulatory Visit: Payer: Medicare HMO

## 2019-08-12 ENCOUNTER — Ambulatory Visit: Payer: Medicare HMO | Attending: Internal Medicine

## 2019-08-12 DIAGNOSIS — Z23 Encounter for immunization: Secondary | ICD-10-CM | POA: Insufficient documentation

## 2019-08-12 NOTE — Progress Notes (Signed)
   Covid-19 Vaccination Clinic  Name:  Sandra Sheppard    MRN: [id number] DOB: 1946/06/02  08/12/2019  [name] was observed post Covid-19 immunization for 15 minutes without incidence. She was provided with Vaccine Information Sheet and instruction to access the V-Safe system.   [name] was instructed to call 911 with any severe reactions post vaccine: Marland Kitchen Difficulty breathing  . Swelling of your face and throat  . A fast heartbeat  . A bad rash all over your body  . Dizziness and weakness    Immunizations Administered    Name Date Dose VIS Date Route   Pfizer COVID-19 Vaccine 08/12/2019 12:36 PM 0.3 mL 05/24/2019 Intramuscular   Manufacturer: [place]   Lot: [id number]   [place]: [id number]

## 2019-08-16 DIAGNOSIS — E1122 Type 2 diabetes mellitus with diabetic chronic kidney disease: Secondary | ICD-10-CM | POA: Diagnosis not present

## 2019-08-16 DIAGNOSIS — D631 Anemia in chronic kidney disease: Secondary | ICD-10-CM | POA: Diagnosis not present

## 2019-08-16 DIAGNOSIS — N179 Acute kidney failure, unspecified: Secondary | ICD-10-CM | POA: Diagnosis not present

## 2019-08-16 DIAGNOSIS — N189 Chronic kidney disease, unspecified: Secondary | ICD-10-CM | POA: Diagnosis not present

## 2019-08-16 DIAGNOSIS — N2581 Secondary hyperparathyroidism of renal origin: Secondary | ICD-10-CM | POA: Diagnosis not present

## 2019-08-16 DIAGNOSIS — N184 Chronic kidney disease, stage 4 (severe): Secondary | ICD-10-CM | POA: Diagnosis not present

## 2019-08-28 DIAGNOSIS — S82831D Other fracture of upper and lower end of right fibula, subsequent encounter for closed fracture with routine healing: Secondary | ICD-10-CM | POA: Diagnosis not present

## 2019-08-29 DIAGNOSIS — S82831D Other fracture of upper and lower end of right fibula, subsequent encounter for closed fracture with routine healing: Secondary | ICD-10-CM | POA: Diagnosis not present

## 2019-09-10 ENCOUNTER — Ambulatory Visit: Payer: Medicare HMO | Attending: Internal Medicine

## 2019-09-10 DIAGNOSIS — Z23 Encounter for immunization: Secondary | ICD-10-CM

## 2019-09-10 NOTE — Progress Notes (Signed)
   Covid-19 Vaccination Clinic  Name:  Sandra Sheppard    MRN: [id number] DOB: April 14, 1946  09/10/2019  Sandra Sheppard was observed post Covid-19 immunization for 15 minutes without incident. She was provided with Vaccine Information Sheet and instruction to access the V-Safe system.   Sandra Sheppard was instructed to call 911 with any severe reactions post vaccine: Marland Kitchen Difficulty breathing  . Swelling of face and throat  . A fast heartbeat  . A bad rash all over body  . Dizziness and weakness   Immunizations Administered    Name Date Dose VIS Date Route   Pfizer COVID-19 Vaccine 09/10/2019  4:15 PM 0.3 mL 05/24/2019 Intramuscular   Manufacturer: [place]   Lot: [id number]   [place]: [id number]

## 2019-09-22 DIAGNOSIS — I6523 Occlusion and stenosis of bilateral carotid arteries: Secondary | ICD-10-CM | POA: Insufficient documentation

## 2019-09-22 DIAGNOSIS — Z720 Tobacco use: Secondary | ICD-10-CM | POA: Insufficient documentation

## 2019-09-22 DIAGNOSIS — Z7189 Other specified counseling: Secondary | ICD-10-CM | POA: Insufficient documentation

## 2019-09-22 NOTE — Progress Notes (Deleted)
Cardiology Office Note   Date:  09/22/2019   ID:  Sandra Sheppard, DOB May 23, 1946, MRN [id number]  PCP:  Prince Solian, MD  Cardiologist:   Minus Breeding, MD Referring:  ***  No chief complaint on file.     History of Present Illness: Sandra Sheppard is a 74 y.o. female who presents for followup after aortic valve replacement. Since I last saw her ***   *** she has she has done very well.  She walks her dog.  The patient denies any new symptoms such as chest discomfort, neck or arm discomfort. There has been no new shortness of breath, PND or orthopnea. There have been no reported palpitations, presyncope or syncope.      Past Medical History:  Diagnosis Date  . ABDOMINAL AORTIC ANEURYSM   . Atrial fibrillation ([place])   . CAROTID STENOSIS   . CKD (chronic kidney disease), stage V ([place])   . Complication of anesthesia   . Confusion   . CVD (cardiovascular disease)   . Diabetes ([place])    no meds at present time  . FIBRILLATION, ATRIAL   . HYPERCHOLESTEROLEMIA  IIA   . HYPERTENSION, MILD   . MURMUR   . Osteoporosis   . PERIPHERAL VASCULAR DISEASE   . PONV (postoperative nausea and vomiting)   . STENOSIS, MITRAL AND AORTIC VALVES     Past Surgical History:  Procedure Laterality Date  . AORTIC VALVE REPLACEMENT    . MYRINGOTOMY WITH TUBE PLACEMENT Bilateral 03/01/2019   Procedure: MYRINGOTOMY WITH  T TUBE PLACEMENT;  Surgeon: Leta Baptist, MD;  Location: [place];  Service: ENT;  Laterality: Bilateral;  . TOTAL ABDOMINAL HYSTERECTOMY       Current Outpatient Medications  Medication Sig Dispense Refill  . amLODipine (NORVASC) 10 MG tablet Take 1 tablet (10 mg total) by mouth daily.    Marland Kitchen aspirin 81 MG tablet Take 81 mg by mouth daily.      . Cholecalciferol (VITAMIN D-3) 1000 UNITS CAPS Take 2,000 Units by mouth daily.     Marland Kitchen docusate sodium (COLACE) 100 MG capsule Take 1 capsule (100 mg total) by mouth 2 (two) times daily.    Marland Kitchen loratadine (CLARITIN) 10  MG tablet Take 10 mg by mouth daily.    . Multiple Vitamin (MULTIVITAMIN) capsule Take 1 capsule by mouth daily.      . pravastatin (PRAVACHOL) 10 MG tablet Take 1 tablet (10 mg total) by mouth daily.    . vitamin B-12 1000 MCG tablet Take 1 tablet (1,000 mcg total) by mouth daily.    . zoledronic acid (RECLAST) 5 MG/100ML SOLN injection Inject 5 mg into the vein See admin instructions. Once a year     No current facility-administered medications for this visit.    Allergies:   Codeine, Sulfonamide derivatives, and Penicillins    ROS:  Please see the history of present illness.   Otherwise, review of systems are positive for {NONE DEFAULTED:18576::"none"}.   All other systems are reviewed and negative.    PHYSICAL EXAM: VS:  There were no vitals taken for this visit. , BMI There is no height or weight on file to calculate BMI. GENERAL:  Well appearing NECK:  No jugular venous distention, waveform within normal limits, carotid upstroke brisk and symmetric, no bruits, no thyromegaly LUNGS:  Clear to auscultation bilaterally CHEST:  Unremarkable HEART:  PMI not displaced or sustained,S1 and S2 within normal limits, no S3, no S4, no clicks, no  rubs, *** murmurs ABD:  Flat, positive bowel sounds normal in frequency in pitch, no bruits, no rebound, no guarding, no midline pulsatile mass, no hepatomegaly, no splenomegaly EXT:  2 plus pulses throughout, no edema, no cyanosis no clubbing    ***GENERAL:  Well appearing HEENT:  Pupils equal round and reactive, fundi not visualized, oral mucosa unremarkable NECK:  No jugular venous distention, waveform within normal limits, carotid upstroke brisk and symmetric, no bruits, no thyromegaly LYMPHATICS:  No cervical, inguinal adenopathy LUNGS:  Clear to auscultation bilaterally BACK:  No CVA tenderness CHEST:  Unremarkable HEART:  PMI not displaced or sustained,S1 and S2 within normal limits, no S3, no S4, no clicks, no rubs, *** murmurs ABD:   Flat, positive bowel sounds normal in frequency in pitch, no bruits, no rebound, no guarding, no midline pulsatile mass, no hepatomegaly, no splenomegaly EXT:  2 plus pulses throughout, no edema, no cyanosis no clubbing SKIN:  No rashes no nodules NEURO:  Cranial nerves II through XII grossly intact, motor grossly intact throughout PSYCH:  Cognitively intact, oriented to person place and time    EKG:  EKG {ACTION; IS/IS GUR:[id number]} ordered today. The ekg ordered today demonstrates ***   Recent Labs: 02/26/2019: BUN 28; Creatinine, Ser 2.41; Potassium 4.6; Sodium 137    Lipid Panel No results found for: CHOL, TRIG, HDL, CHOLHDL, VLDL, LDLCALC, LDLDIRECT    Wt Readings from Last 3 Encounters:  03/01/19 120 lb 2.4 oz (54.5 kg)  08/30/18 123 lb 14.4 oz (56.2 kg)  07/18/18 112 lb (50.8 kg)      Other studies Reviewed: Additional studies/ records that were reviewed today include: ***. Review of the above records demonstrates:  Please see elsewhere in the note.  ***   ASSESSMENT AND PLAN:  AORTIC VALVE REPLACEMENT, HX OF - She had mild stenosis in 2018. ***  Her exam has not changed.  No change in therapy or further imaging is indicated.  I did review the echo from her last visit for this  ABDOMINAL AORTIC ANEURYSM - This was 3.3 x 3 in Dec 2018.  She needs follow up. *** .  I reviewed these results.  No change in therapy or further imaging at this point.  DYSLIPIDEMIA - Her LDL was *** 75.  She will continue the meds as listed.  HYPERTENSION, MILD -  Her blood pressure is *** elevated as above.  However, I repeated it before she left and it was 148/74.  She says is well controlled at home.  It was well controlled in her primary care office.  No change in therapy and she will keep an eye on it.  TOBACCO USE - She says she is still not smoking.  She quit in 2009.  ***  per her report.  CAROTID STENOSIS - This was mild in Jan 2019.  *** No need for repeat imaging at this  point.    COVID EDUCATION -  ***   Current medicines are reviewed at length with the patient today.  The patient {ACTIONS; HAS/DOES NOT HAVE:19233} concerns regarding medicines.  The following changes have been made:  {PLAN; NO CHANGE:13088:s}  Labs/ tests ordered today include: *** No orders of the defined types were placed in this encounter.    Disposition:   FU with ***    Signed, Minus Breeding, MD  09/22/2019 8:07 PM    [facility]

## 2019-09-24 ENCOUNTER — Ambulatory Visit: Payer: Medicare HMO | Admitting: Cardiology

## 2019-09-25 DIAGNOSIS — S82831D Other fracture of upper and lower end of right fibula, subsequent encounter for closed fracture with routine healing: Secondary | ICD-10-CM | POA: Diagnosis not present

## 2019-09-29 NOTE — Progress Notes (Signed)
Cardiology Office Note   Date:  10/01/2019   ID:  Sandra Sheppard, DOB 1945/11/23, MRN [id number]  PCP:  Prince Solian, MD  Cardiologist:   Minus Breeding, MD   Chief Complaint  Patient presents with  . Coronary Artery Disease      History of Present Illness: Sandra Sheppard is a 74 y.o. female who presents for followup after aortic valve replacement. Since I last saw her she was in the hospital in February.  I reviewed these records for this visit.  She apparently presented to her primary provider and sounds like she had altered mental status and was found to have dehydration.  There was mention of nausea vomiting and diarrhea.  She had acute renal insufficiency.  She does not recall any of this.  She went to a nursing home she says for 8 weeks afterwards and then 2 weeks with her sister but is back home now.  She says her family is unhappy because she is home.  She has her little dog that she walks. The patient denies any new symptoms such as chest discomfort, neck or arm discomfort. There has been no new shortness of breath, PND or orthopnea. There have been no reported palpitations, presyncope or syncope.    Past Medical History:  Diagnosis Date  . ABDOMINAL AORTIC ANEURYSM   . Atrial fibrillation ([place])   . CAROTID STENOSIS   . CKD (chronic kidney disease), stage V ([place])   . Complication of anesthesia   . Confusion   . CVD (cardiovascular disease)   . Diabetes ([place])    no meds at present time  . FIBRILLATION, ATRIAL   . HYPERCHOLESTEROLEMIA  IIA   . HYPERTENSION, MILD   . MURMUR   . Osteoporosis   . PERIPHERAL VASCULAR DISEASE   . PONV (postoperative nausea and vomiting)   . STENOSIS, MITRAL AND AORTIC VALVES     Past Surgical History:  Procedure Laterality Date  . AORTIC VALVE REPLACEMENT    . MYRINGOTOMY WITH TUBE PLACEMENT Bilateral 03/01/2019   Procedure: MYRINGOTOMY WITH  T TUBE PLACEMENT;  Surgeon: Leta Baptist, MD;  Location: [place];  Service:  ENT;  Laterality: Bilateral;  . TOTAL ABDOMINAL HYSTERECTOMY       Current Outpatient Medications  Medication Sig Dispense Refill  . amLODipine (NORVASC) 10 MG tablet Take 1 tablet (10 mg total) by mouth daily.    Marland Kitchen aspirin 81 MG tablet Take 81 mg by mouth daily.      . Cholecalciferol (VITAMIN D-3) 1000 UNITS CAPS Take 2,000 Units by mouth daily.     Marland Kitchen docusate sodium (COLACE) 100 MG capsule Take 1 capsule (100 mg total) by mouth 2 (two) times daily.    Marland Kitchen loratadine (CLARITIN) 10 MG tablet Take 10 mg by mouth daily.    . Multiple Vitamin (MULTIVITAMIN) capsule Take 1 capsule by mouth daily.      . pravastatin (PRAVACHOL) 10 MG tablet Take 1 tablet (10 mg total) by mouth daily.    . vitamin B-12 1000 MCG tablet Take 1 tablet (1,000 mcg total) by mouth daily.    . zoledronic acid (RECLAST) 5 MG/100ML SOLN injection Inject 5 mg into the vein See admin instructions. Once a year     No current facility-administered medications for this visit.    Allergies:   Codeine, Sulfonamide derivatives, and Penicillins    ROS:  Please see the history of present illness.   Otherwise, review of systems are positive  for none.   All other systems are reviewed and negative.    PHYSICAL EXAM: VS:  BP (!) 126/58 (BP Location: Left Arm, Patient Position: Sitting, Cuff Size: Normal)   Pulse 72   Ht 5' 4.5" (1.638 m)   Wt 119 lb (54 kg)   BMI 20.11 kg/m  , BMI Body mass index is 20.11 kg/m. GENERAL:  Well appearing NECK:  No jugular venous distention, waveform within normal limits, carotid upstroke brisk and symmetric, no bruits, no thyromegaly LUNGS:  Clear to auscultation bilaterally CHEST:  Unremarkable HEART:  PMI not displaced or sustained,S1 and S2 within normal limits, no S3, no S4, no clicks, no rubs, 2 out of 6 brief apical systolic murmur radiating slightly at the aortic outflow tract, no diastolic murmurs ABD:  Flat, positive bowel sounds normal in frequency in pitch, no bruits, no rebound,  no guarding, no midline pulsatile mass, no hepatomegaly, no splenomegaly EXT:  2 plus pulses right radial, absent left radial, diminished mild dorsalis pedis and posterior tibialis bilateral, no edema, no cyanosis no clubbing  EKG:  EKG is ordered today. The ekg ordered today demonstrates sinus rhythm, rate 72, axis within normal limits, intervals within normal limits, nonspecific lateral T wave flattening.   Recent Labs: 02/26/2019: BUN 28; Creatinine, Ser 2.41; Potassium 4.6; Sodium 137    Lipid Panel No results found for: CHOL, TRIG, HDL, CHOLHDL, VLDL, LDLCALC, LDLDIRECT    Wt Readings from Last 3 Encounters:  10/01/19 119 lb (54 kg)  03/01/19 120 lb 2.4 oz (54.5 kg)  08/30/18 123 lb 14.4 oz (56.2 kg)      Other studies Reviewed: Additional studies/ records that were reviewed today include: Hospital records. Review of the above records demonstrates:  Please see elsewhere in the note.     ASSESSMENT AND PLAN:  AORTIC VALVE REPLACEMENT, HX OF - She had mild stenosis in 2018.   I would not suspect that this is worse clinically.  No further imaging is indicated at this point.  ABDOMINAL AORTIC ANEURYSM - This was 3.3 x 3 in Dec 2018.  She needs follow up.  I will arrange follow-up ultrasound.  DYSLIPIDEMIA - Her LDL was slightly above target at 101 with an HDL of 36.  However, she has been sensitive to meds and has not tolerated up titration.  No change in therapy.  HYPERTENSION, MILD -  Her blood pressure is at target.  No change in therapy.   TOBACCO USE - She says she is still not smoking.  She quit in 2009.  She reports she still not smoking.  CAROTID STENOSIS - This was mild in Jan 2019.    No indication for repeat imaging at this point.   CKD IIIB - She is followed by Dr. Justin Mend  COVID EDUCATION -  She has had her vaccines.   Current medicines are reviewed at length with the patient today.  The patient does not have concerns regarding medicines.  The  following changes have been made:  no change  Labs/ tests ordered today include:   Orders Placed This Encounter  Procedures  . EKG 12-Lead  . VAS [place] AAA DUPLEX     Disposition:   FU with me in six months.     Signed, Minus Breeding, MD  10/01/2019 2:47 PM    [place]

## 2019-10-01 ENCOUNTER — Ambulatory Visit: Payer: Medicare HMO | Admitting: Cardiology

## 2019-10-01 ENCOUNTER — Encounter: Payer: Self-pay | Admitting: Cardiology

## 2019-10-01 ENCOUNTER — Other Ambulatory Visit: Payer: Self-pay

## 2019-10-01 VITALS — BP 126/58 | HR 72 | Ht 64.5 in | Wt 119.0 lb

## 2019-10-01 DIAGNOSIS — I714 Abdominal aortic aneurysm, without rupture, unspecified: Secondary | ICD-10-CM

## 2019-10-01 DIAGNOSIS — Z720 Tobacco use: Secondary | ICD-10-CM

## 2019-10-01 DIAGNOSIS — I10 Essential (primary) hypertension: Secondary | ICD-10-CM

## 2019-10-01 DIAGNOSIS — Z952 Presence of prosthetic heart valve: Secondary | ICD-10-CM | POA: Diagnosis not present

## 2019-10-01 DIAGNOSIS — I779 Disorder of arteries and arterioles, unspecified: Secondary | ICD-10-CM

## 2019-10-01 DIAGNOSIS — E785 Hyperlipidemia, unspecified: Secondary | ICD-10-CM | POA: Diagnosis not present

## 2019-10-01 DIAGNOSIS — Z7189 Other specified counseling: Secondary | ICD-10-CM | POA: Diagnosis not present

## 2019-10-01 NOTE — Patient Instructions (Addendum)
Medication Instructions:  NO CHANGES *If you need a refill on your cardiac medications before your next appointment, please call your pharmacy*  Lab Work: NONE ORDERED THIS VISIT  Testing/Procedures: Your physician has requested that you have a VASCULAR AAA duplex. During this test, an ultrasound is used to evaluate the aorta. Allow 30 minutes for this exam. Do not eat after midnight the day before and avoid carbonated beverages  Follow-Up: At [facility], you and your health needs are our priority.  As part of our continuing mission to provide you with exceptional heart care, we have created designated Provider Care Teams.  These Care Teams include your primary Cardiologist (physician) and Advanced Practice Providers (APPs -  Physician Assistants and Nurse Practitioners) who all work together to provide you with the care you need, when you need it.   Your next appointment:   6 month(s)  The format for your next appointment:   In Person  Provider:   Minus Breeding, MD

## 2019-10-09 ENCOUNTER — Encounter (HOSPITAL_COMMUNITY): Payer: Self-pay | Admitting: *Deleted

## 2019-10-09 ENCOUNTER — Emergency Department (HOSPITAL_COMMUNITY): Payer: Medicare HMO

## 2019-10-09 ENCOUNTER — Emergency Department (HOSPITAL_COMMUNITY)
Admission: EM | Admit: 2019-10-09 | Discharge: 2019-10-09 | Disposition: A | Discharge status: Home / Self Care | Payer: Medicare HMO | Attending: Emergency Medicine | Admitting: Emergency Medicine

## 2019-10-09 ENCOUNTER — Other Ambulatory Visit: Payer: Self-pay

## 2019-10-09 DIAGNOSIS — R52 Pain, unspecified: Secondary | ICD-10-CM | POA: Diagnosis not present

## 2019-10-09 DIAGNOSIS — Z87891 Personal history of nicotine dependence: Secondary | ICD-10-CM | POA: Insufficient documentation

## 2019-10-09 DIAGNOSIS — Z7982 Long term (current) use of aspirin: Secondary | ICD-10-CM | POA: Insufficient documentation

## 2019-10-09 DIAGNOSIS — S0083XA Contusion of other part of head, initial encounter: Secondary | ICD-10-CM | POA: Insufficient documentation

## 2019-10-09 DIAGNOSIS — E1122 Type 2 diabetes mellitus with diabetic chronic kidney disease: Secondary | ICD-10-CM | POA: Insufficient documentation

## 2019-10-09 DIAGNOSIS — N183 Chronic kidney disease, stage 3 unspecified: Secondary | ICD-10-CM | POA: Diagnosis not present

## 2019-10-09 DIAGNOSIS — E1165 Type 2 diabetes mellitus with hyperglycemia: Secondary | ICD-10-CM | POA: Diagnosis not present

## 2019-10-09 DIAGNOSIS — Y92002 Bathroom of unspecified non-institutional (private) residence single-family (private) house as the place of occurrence of the external cause: Secondary | ICD-10-CM | POA: Insufficient documentation

## 2019-10-09 DIAGNOSIS — S199XXA Unspecified injury of neck, initial encounter: Secondary | ICD-10-CM | POA: Diagnosis not present

## 2019-10-09 DIAGNOSIS — I129 Hypertensive chronic kidney disease with stage 1 through stage 4 chronic kidney disease, or unspecified chronic kidney disease: Secondary | ICD-10-CM | POA: Insufficient documentation

## 2019-10-09 DIAGNOSIS — W19XXXA Unspecified fall, initial encounter: Secondary | ICD-10-CM | POA: Diagnosis not present

## 2019-10-09 DIAGNOSIS — G4489 Other headache syndrome: Secondary | ICD-10-CM | POA: Diagnosis not present

## 2019-10-09 DIAGNOSIS — R519 Headache, unspecified: Secondary | ICD-10-CM | POA: Diagnosis not present

## 2019-10-09 DIAGNOSIS — Y9389 Activity, other specified: Secondary | ICD-10-CM | POA: Diagnosis not present

## 2019-10-09 DIAGNOSIS — Y999 Unspecified external cause status: Secondary | ICD-10-CM | POA: Insufficient documentation

## 2019-10-09 DIAGNOSIS — W1830XA Fall on same level, unspecified, initial encounter: Secondary | ICD-10-CM | POA: Insufficient documentation

## 2019-10-09 DIAGNOSIS — R000 Tachycardia, unspecified: Secondary | ICD-10-CM | POA: Diagnosis not present

## 2019-10-09 DIAGNOSIS — S0990XA Unspecified injury of head, initial encounter: Secondary | ICD-10-CM | POA: Diagnosis not present

## 2019-10-09 LAB — CBC
HCT: 36.9 % (ref 36.0–46.0)
Hemoglobin: 12.1 g/dL (ref 12.0–15.0)
MCH: 29.3 pg (ref 26.0–34.0)
MCHC: 32.8 g/dL (ref 30.0–36.0)
MCV: 89.3 fL (ref 80.0–100.0)
Platelets: 221 10*3/uL (ref 150–400)
RBC: 4.13 MIL/uL (ref 3.87–5.11)
RDW: 13.6 % (ref 11.5–15.5)
WBC: 7.4 10*3/uL (ref 4.0–10.5)
nRBC: 0 % (ref 0.0–0.2)

## 2019-10-09 LAB — BASIC METABOLIC PANEL
Anion gap: 12 (ref 5–15)
BUN: 30 mg/dL — ABNORMAL HIGH (ref 8–23)
CO2: 21 mmol/L — ABNORMAL LOW (ref 22–32)
Calcium: 9.6 mg/dL (ref 8.9–10.3)
Chloride: 103 mmol/L (ref 98–111)
Creatinine, Ser: 2.38 mg/dL — ABNORMAL HIGH (ref 0.44–1.00)
GFR calc Af Amer: 23 mL/min — ABNORMAL LOW (ref >60)
GFR calc non Af Amer: 19 mL/min — ABNORMAL LOW (ref >60)
Glucose, Bld: 207 mg/dL — ABNORMAL HIGH (ref 70–99)
Potassium: 4.5 mmol/L (ref 3.5–5.1)
Sodium: 136 mmol/L (ref 135–145)

## 2019-10-09 LAB — URINALYSIS, ROUTINE W REFLEX MICROSCOPIC
Bacteria, UA: NONE SEEN
Bilirubin Urine: NEGATIVE
Glucose, UA: NEGATIVE mg/dL
Hgb urine dipstick: NEGATIVE
Ketones, ur: NEGATIVE mg/dL
Nitrite: NEGATIVE
Protein, ur: 30 mg/dL — AB
Specific Gravity, Urine: 1.014 (ref 1.005–1.030)
pH: 5 (ref 5.0–8.0)

## 2019-10-09 IMAGING — CT CT CERVICAL SPINE W/O CM
5 of 8 series · 12 of 33 positions shown, 13 images · non-contrast
Comparison: None.

CLINICAL DATA: Facial trauma

EXAM:
CT HEAD WITHOUT CONTRAST
CT CERVICAL SPINE WITHOUT CONTRAST
TECHNIQUE: Multidetector CT imaging of the head and cervical spine was
performed following the standard protocol without intravenous
contrast. Multiplanar CT image reconstructions of the cervical spine
were also generated.

[Series 5: head bone · axial · 0.44mm/px · z∈[-106,-52]mm · 2 of 82 slices shown]
[im 28/82  bone]
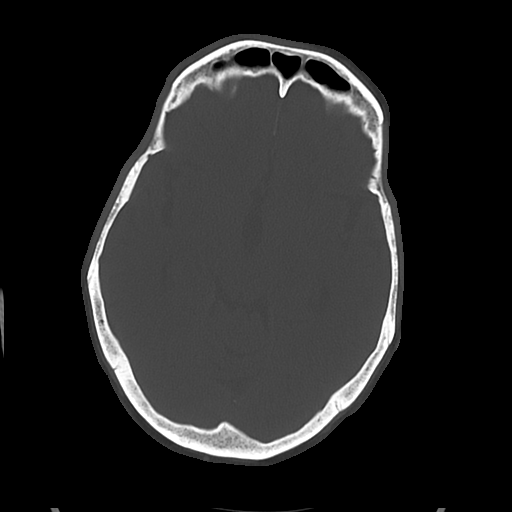
[im 55/82  bone]
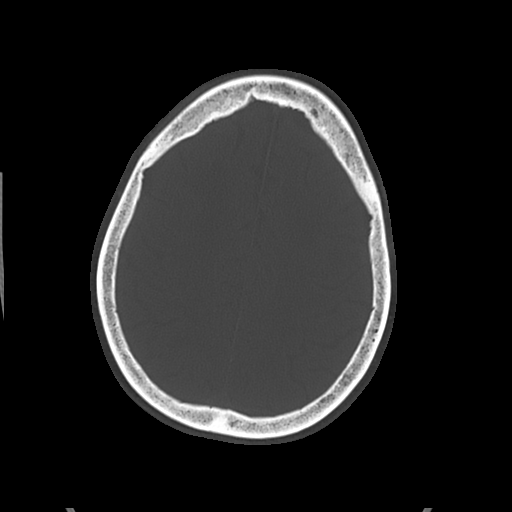

[Series 9: c spine soft · axial · 0.33mm/px · z∈[-235,-175]mm · 2 of 92 slices shown]
[im 31/92  soft-tissue]
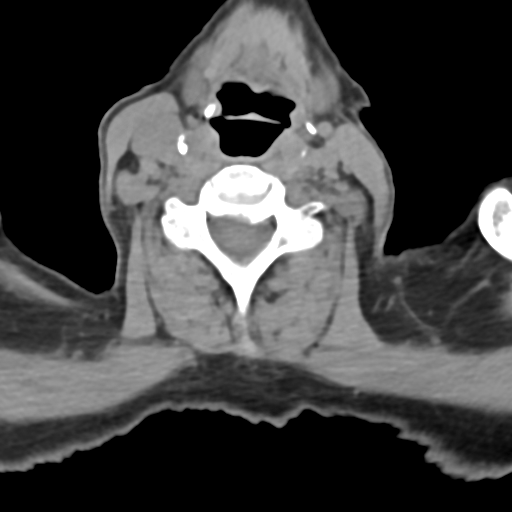
[im 61/92  soft-tissue]
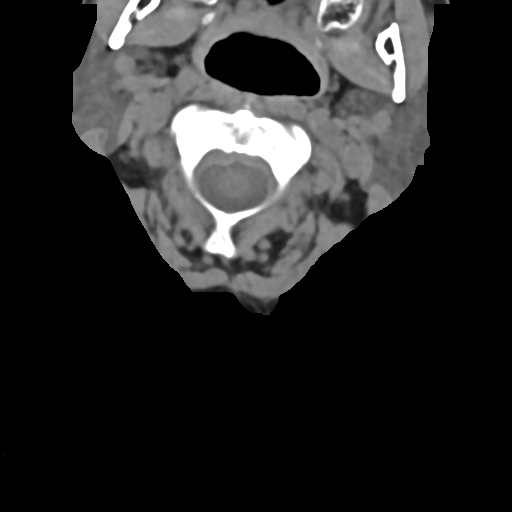

[Series 12: sag bone · sagittal · 0.24mm/px · 5 of 79 slices shown]
[im 14/79  bone]
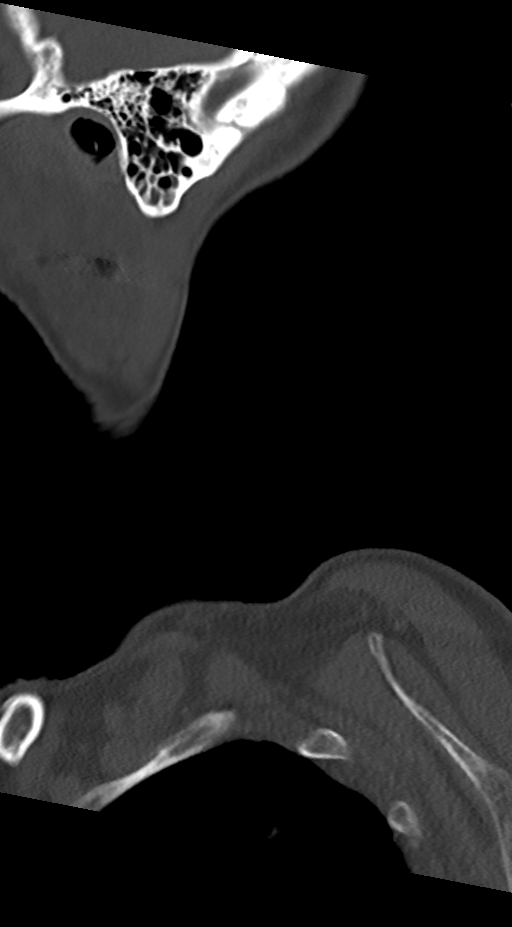
[im 27/79  bone]
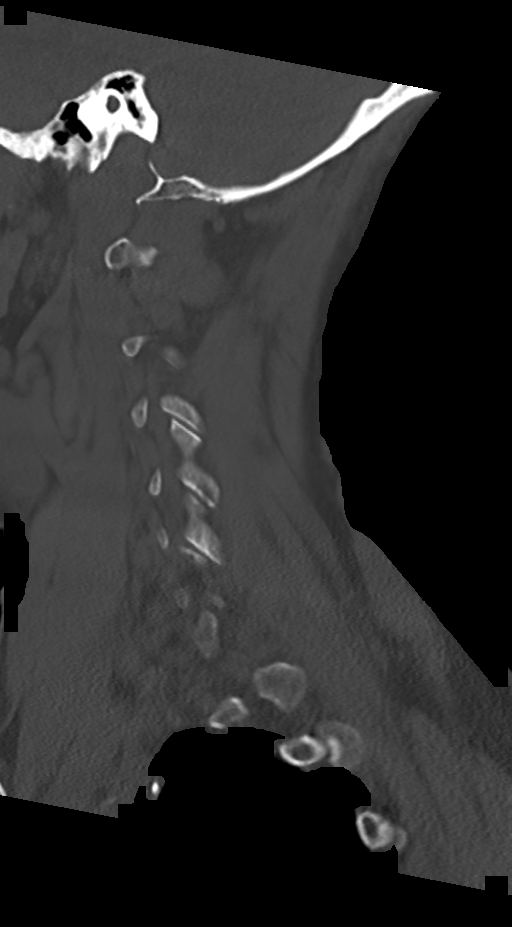
[im 40/79  bone]
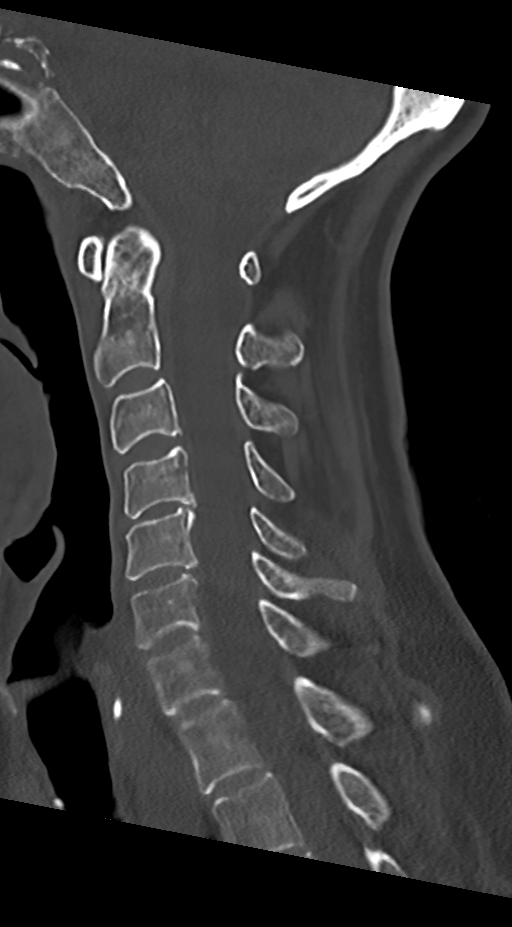
[im 53/79  bone]
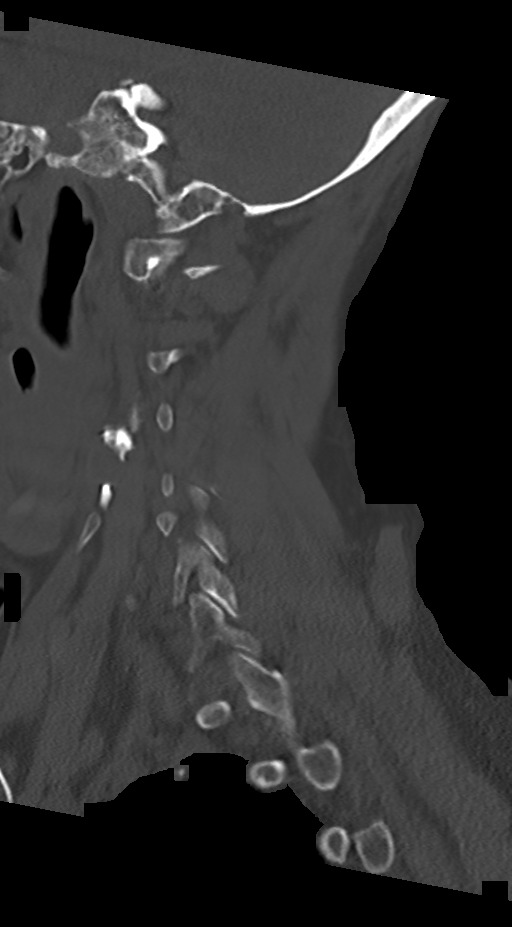
[im 66/79  bone]
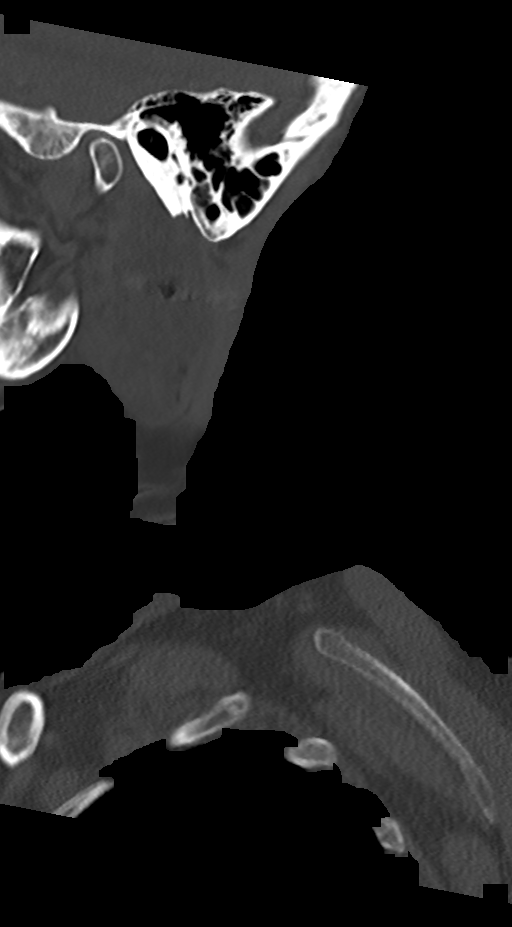

[Series 13: cor bone · coronal · 0.26mm/px · 1 of 71 slices shown]
[im 36/71  bone]
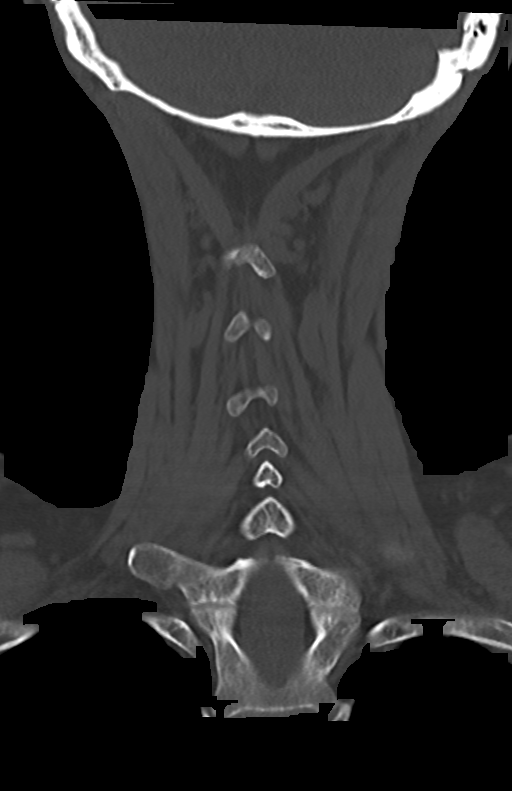

[Series 14: orthogonal axials · axial · 0.21mm/px · z∈[-266,-206]mm · 2 of 87 slices shown, 3 images]
[im 29/87  soft-tissue]
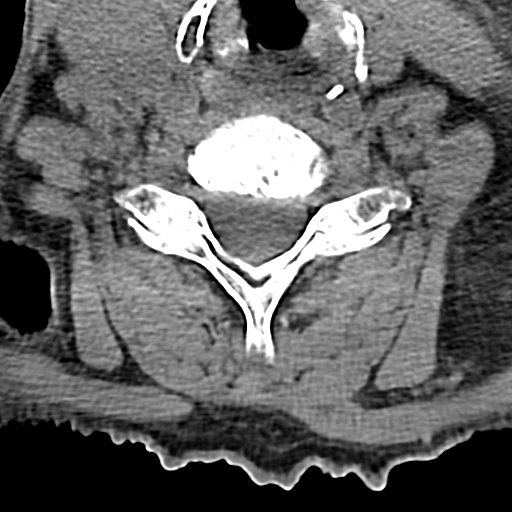
[im 29/87  bone]
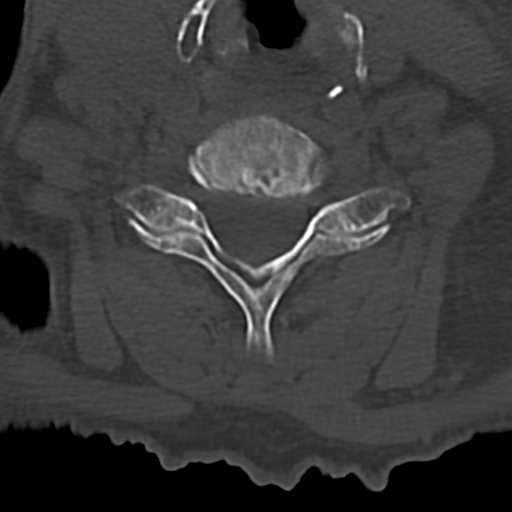
[im 58/87  bone]
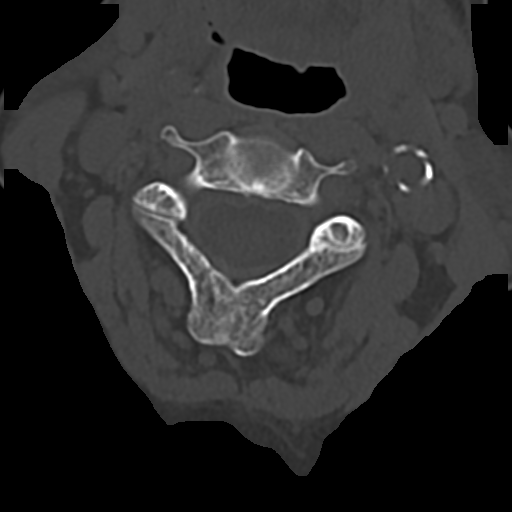

[12 of 33 positions shown; findings below may reference images not displayed]

FINDINGS: CT HEAD FINDINGS

Brain: No evidence of acute infarction, hemorrhage, hydrocephalus,
extra-axial collection or mass lesion/mass effect. Extensive
periventricular and deep white matter hypodensity. Nonacute lacunar
infarctions of the right caudate head and lentiform nuclei.

Vascular: No hyperdense vessel or unexpected calcification.

Skull: Normal. Negative for fracture or focal lesion.

Sinuses/Orbits: No acute finding.

Other: Soft tissue contusion and hematoma of the right forehead.

CT CERVICAL SPINE FINDINGS

Alignment: Normal.

Skull base and vertebrae: No acute fracture. No primary bone lesion
or focal pathologic process.

Soft tissues and spinal canal: No prevertebral fluid or swelling. No
visible canal hematoma.

Disc levels: Mild multilevel disc space height loss and
osteophytosis.

Upper chest: Emphysema.

Other: None.
IMPRESSION: 1.  No acute intracranial pathology.

2. Advanced small-vessel white matter disease. Nonacute lacunar
infarctions of the right caudate head and lentiform nuclei.

3.  Soft tissue contusion and hematoma of the right forehead.

4.  No fracture or static subluxation of the cervical spine.

5.  Emphysema ([4R]-[4R]).

## 2019-10-09 IMAGING — CT CT HEAD W/O CM
4 series · 16 of 47 positions shown, 18 images · non-contrast
Comparison: None.

CLINICAL DATA: Facial trauma

EXAM:
CT HEAD WITHOUT CONTRAST
CT CERVICAL SPINE WITHOUT CONTRAST
TECHNIQUE: Multidetector CT imaging of the head and cervical spine was
performed following the standard protocol without intravenous
contrast. Multiplanar CT image reconstructions of the cervical spine
were also generated.

[Series 4: head wo · axial · 0.44mm/px · z∈[-140,-20]mm · 7 of 33 slices shown, 9 images]
[im 5/33  brain]
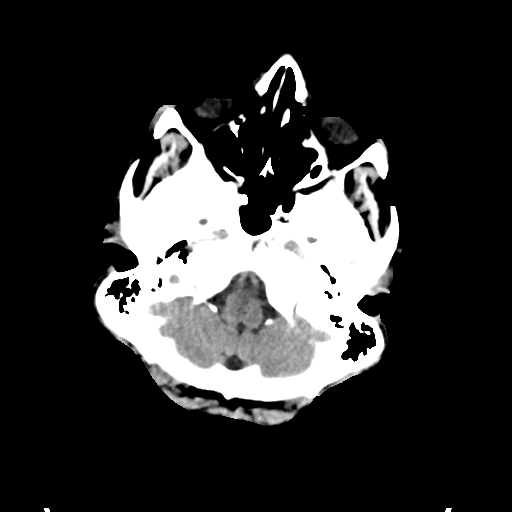
[im 5/33  bone]
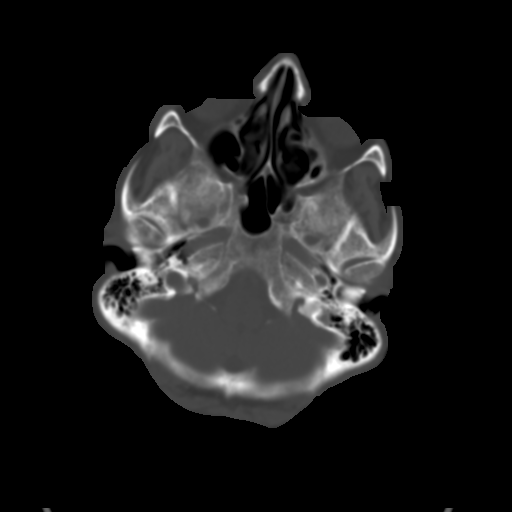
[im 9/33  brain]
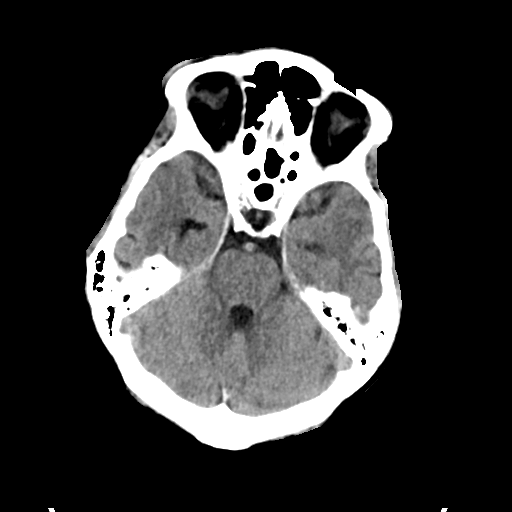
[im 13/33  brain]
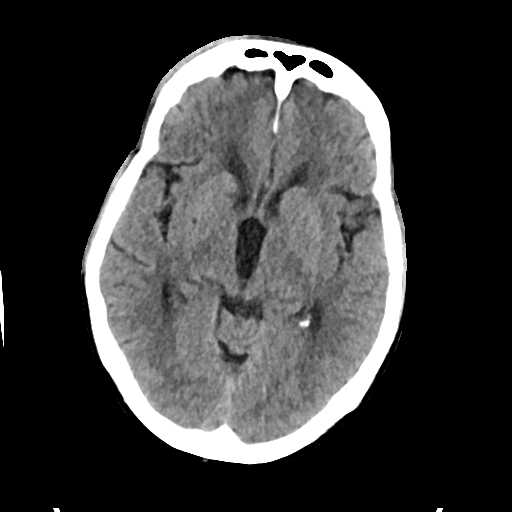
[im 17/33  brain]
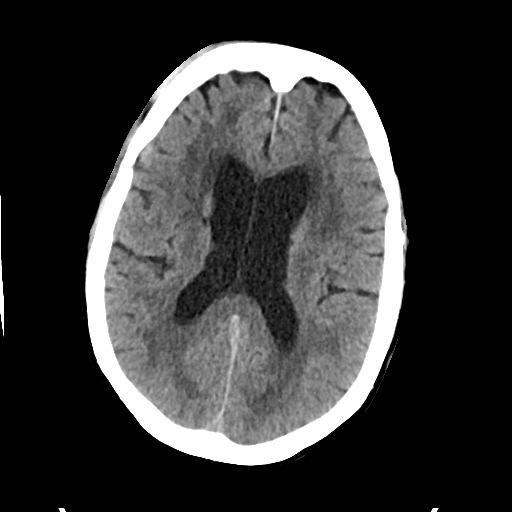
[im 21/33  brain]
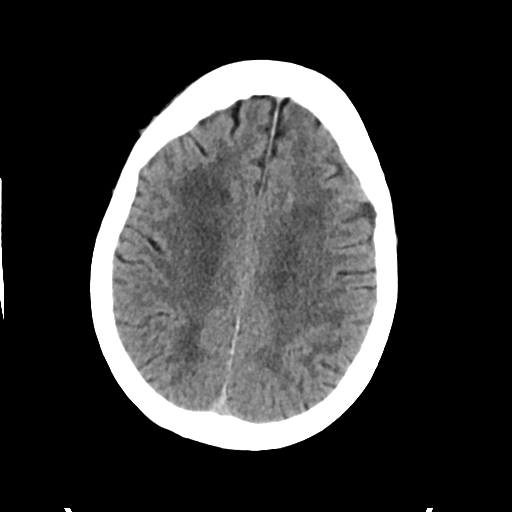
[im 21/33  bone]
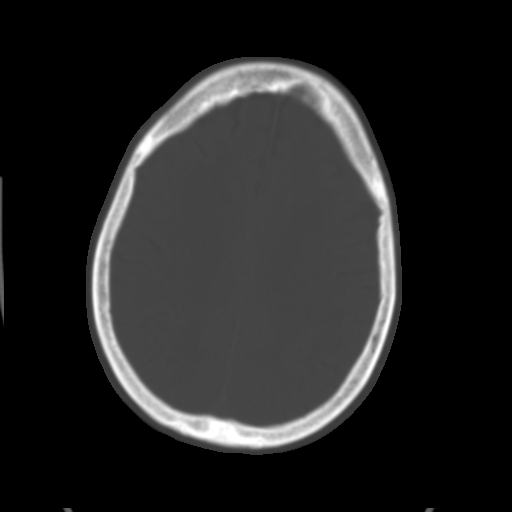
[im 25/33  brain]
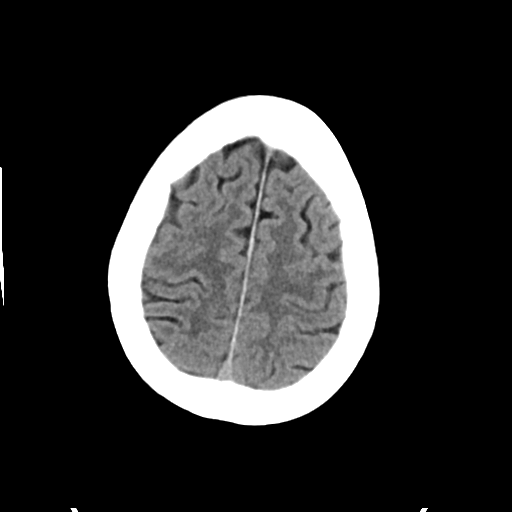
[im 29/33  brain]
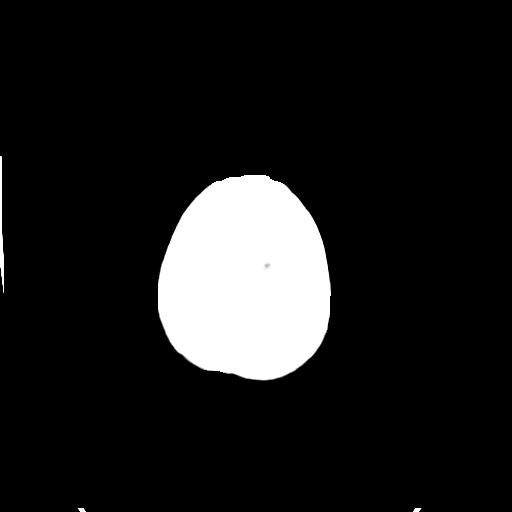

[Series 5: head bone · axial · 0.44mm/px · z∈[-144,-112]mm · 3 of 82 slices shown]
[im 9/82  bone]
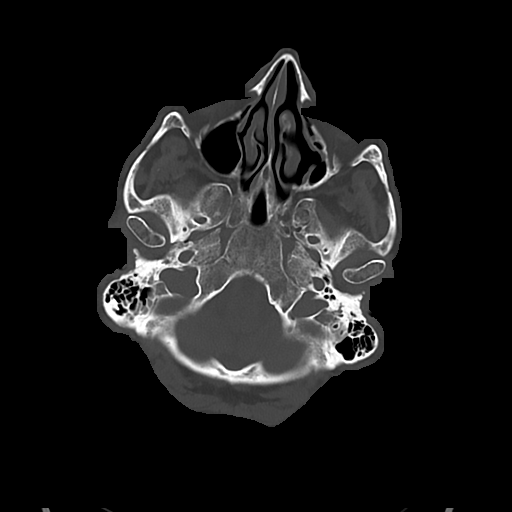
[im 17/82  bone]
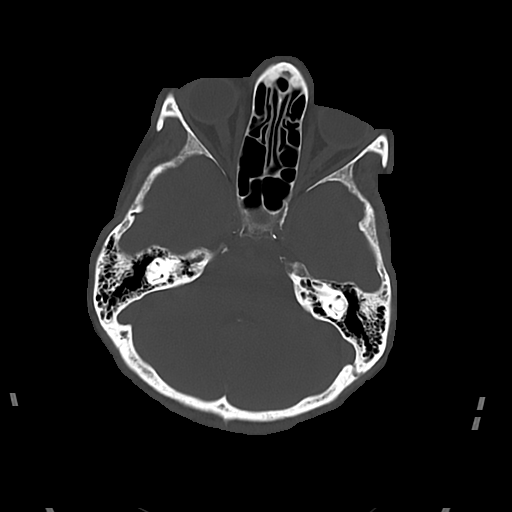
[im 25/82  bone]
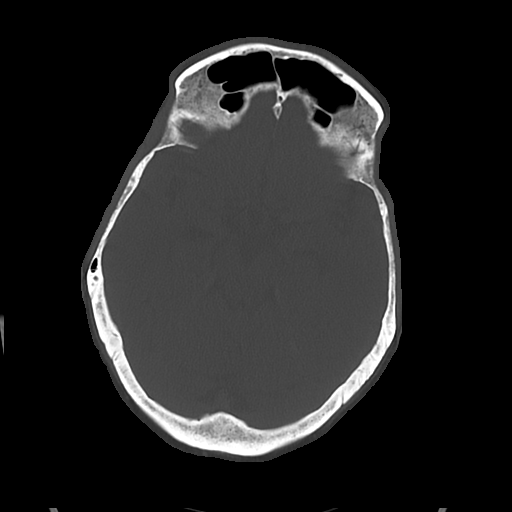

[Series 6: cor soft · coronal · 0.33mm/px · 3 of 71 slices shown]
[im 24/71  brain]
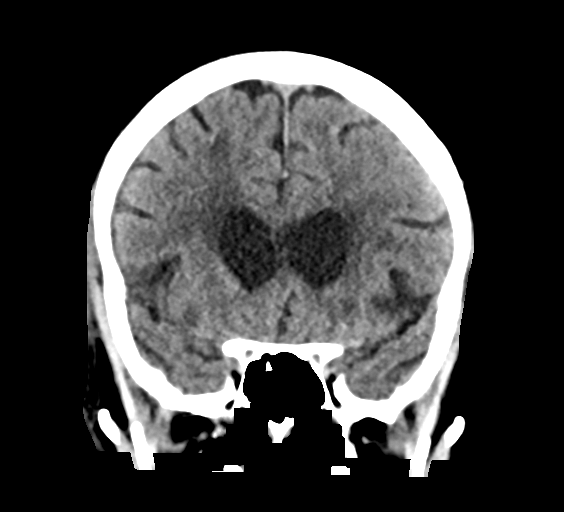
[im 32/71  brain]
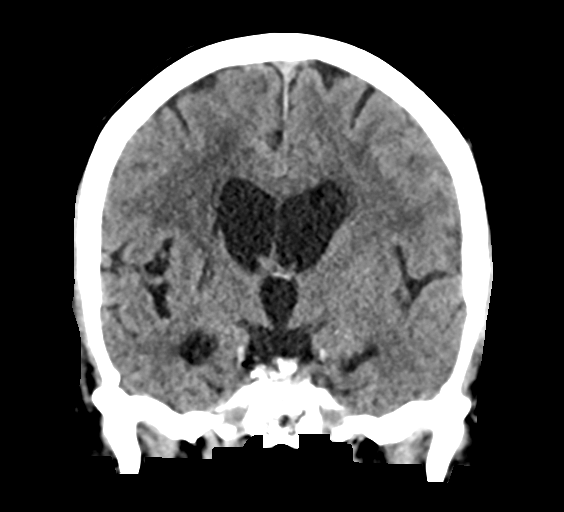
[im 39/71  brain]
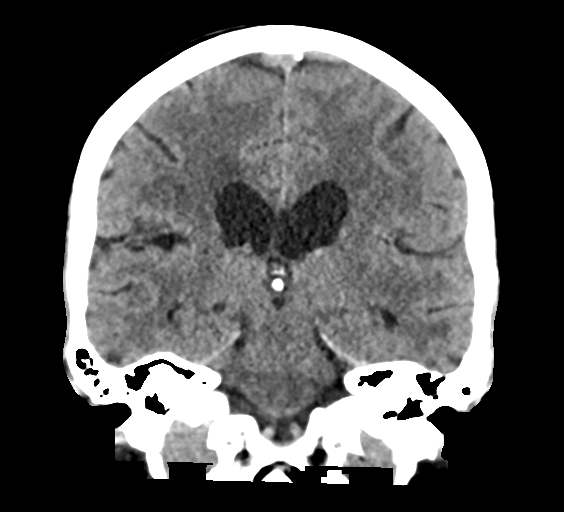

[Series 7: sag soft · sagittal · 0.32mm/px · 3 of 61 slices shown]
[im 21/61  brain]
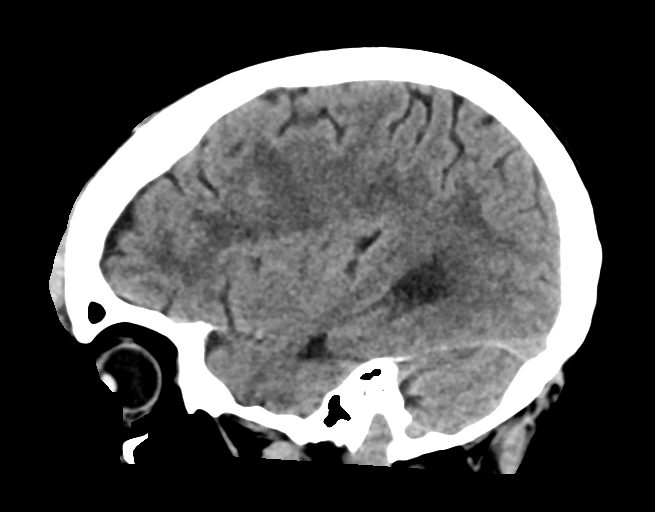
[im 31/61  brain]
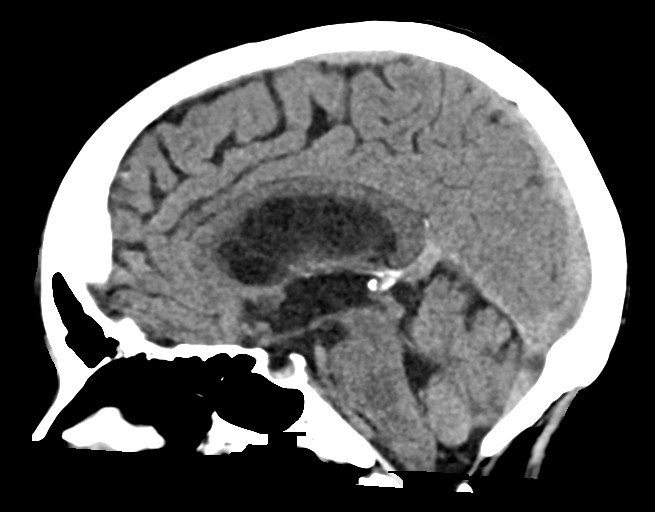
[im 41/61  brain]
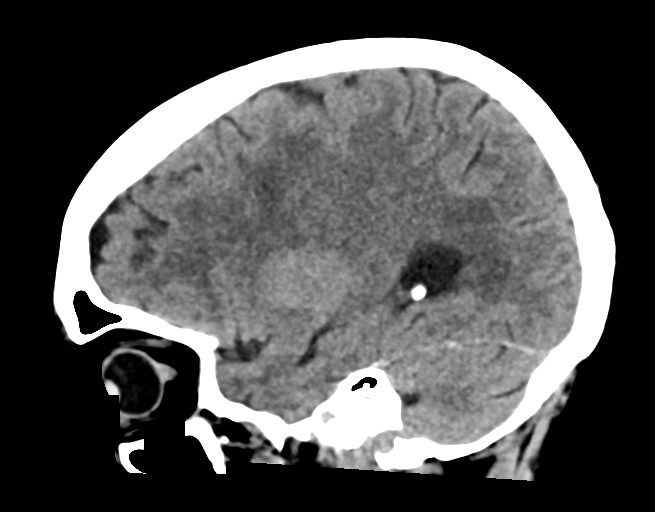

[16 of 47 positions shown; findings below may reference images not displayed]

FINDINGS: CT HEAD FINDINGS

Brain: No evidence of acute infarction, hemorrhage, hydrocephalus,
extra-axial collection or mass lesion/mass effect. Extensive
periventricular and deep white matter hypodensity. Nonacute lacunar
infarctions of the right caudate head and lentiform nuclei.

Vascular: No hyperdense vessel or unexpected calcification.

Skull: Normal. Negative for fracture or focal lesion.

Sinuses/Orbits: No acute finding.

Other: Soft tissue contusion and hematoma of the right forehead.

CT CERVICAL SPINE FINDINGS

Alignment: Normal.

Skull base and vertebrae: No acute fracture. No primary bone lesion
or focal pathologic process.

Soft tissues and spinal canal: No prevertebral fluid or swelling. No
visible canal hematoma.

Disc levels: Mild multilevel disc space height loss and
osteophytosis.

Upper chest: Emphysema.

Other: None.
IMPRESSION: 1.  No acute intracranial pathology.

2. Advanced small-vessel white matter disease. Nonacute lacunar
infarctions of the right caudate head and lentiform nuclei.

3.  Soft tissue contusion and hematoma of the right forehead.

4.  No fracture or static subluxation of the cervical spine.

5.  Emphysema ([4R]-[4R]).

## 2019-10-09 MED ORDER — SODIUM CHLORIDE 0.9% FLUSH: 3.0000 mL | Freq: Once | INTRAVENOUS | Status: DC

## 2019-10-09 MED ORDER — BACITRACIN ZINC 500 UNIT/GM EX OINT: TOPICAL_OINTMENT | Freq: Once | CUTANEOUS | Status: AC

## 2019-10-09 NOTE — ED Triage Notes (Signed)
To ED via [facility] for eval after falling in the bathroom sometime during the night. Pt states she isn't sure if she was dizzy or tripped. With hematoma to right side of head with dried blood. Alert and oriented x4. Speech clear. No neuro deficits. Denies nausea/vomiting. Pupils 2-3 and equally round.

## 2019-10-09 NOTE — Discharge Instructions (Addendum)
You have been seen today after a fall. Please read and follow all provided instructions. Return to the emergency room for worsening condition or new concerning symptoms.    The CT scan of your head did not show any signs of bleeding.  1. Medications:  Continue usual home medications Take medications as prescribed. Please review all of the medicines and only take them if you do not have an allergy to them.   2. Treatment: rest, drink plenty of fluids  3. Follow Up:  Please follow up with primary care provider by scheduling an appointment as soon as possible for a visit  -Your blood sugar was elevated today and you need to have it rechecked by your doctor.   It is also a possibility that you have an allergic reaction to any of the medicines that you have been prescribed - Everybody reacts differently to medications and while MOST people have no trouble with most medicines, you may have a reaction such as nausea, vomiting, rash, swelling, shortness of breath. If this is the case, please stop taking the medicine immediately and contact your physician.  ?

## 2019-10-09 NOTE — ED Notes (Signed)
Discharge instructions reviewed with pt. Pt verbalized understanding.   

## 2019-10-09 NOTE — ED Notes (Signed)
Pt ambulated with strong gait  

## 2019-10-09 NOTE — ED Notes (Signed)
Pt ambulated to restroom with assistance. Pt noted to have a steady gait and denies any dizziness.

## 2019-10-09 NOTE — ED Provider Notes (Signed)
[facility] EMERGENCY DEPARTMENT Provider Note   CSN: [id number] Arrival date & time: 10/09/19  1108     History Chief Complaint  Patient presents with  . Fall    Sandra Sheppard is a 74 y.o. female with past medical history significant for abdominal aortic aneurysm, A. fib, CKD, diabetes, hypertension, PVD, mitral and aortic valve stenosis presenting to emergency department today via EMS with chief complaint of fall.  Patient states she got up during the night to use the bathroom and she tripped over the door jam.  She fell and hit the right side of her head on the tub.  She denies loss of consciousness.  She was able to scoot on her backside to get to her phone to call 911.  She was complaining of a mild headache after the fall but denies any headache currently.  She denies any neck pain.  Also denies fever, chills, visual changes chest pain, shortness of breath, abdominal pain, urinary symptoms, diarrhea.  Patient takes aspirin daily, no other anticoagulation.  Patient tells me her tetanus is up-to-date.  History provided by patient with additional history obtained from chart review.     Past Medical History:  Diagnosis Date  . ABDOMINAL AORTIC ANEURYSM   . Atrial fibrillation ([place])   . CAROTID STENOSIS   . CKD (chronic kidney disease), stage V ([place])   . Complication of anesthesia   . Confusion   . CVD (cardiovascular disease)   . Diabetes ([place])    no meds at present time  . FIBRILLATION, ATRIAL   . HYPERCHOLESTEROLEMIA  IIA   . HYPERTENSION, MILD   . MURMUR   . Osteoporosis   . PERIPHERAL VASCULAR DISEASE   . PONV (postoperative nausea and vomiting)   . STENOSIS, MITRAL AND AORTIC VALVES     Patient Active Problem List   Diagnosis Date Noted  . Tobacco abuse 09/22/2019  . Educated about COVID-19 virus infection 09/22/2019  . Asymptomatic bilateral carotid artery stenosis 09/22/2019  . Acute renal failure with acute tubular necrosis superimposed on  stage 3 chronic kidney disease ([place])   . Palliative care encounter   . Hypocalcemia   . FTT (failure to thrive) in adult   . AKI (acute kidney injury) ([place]) 08/08/2018  . Hyperkalemia 08/08/2018  . Metabolic acidosis, increased anion gap 08/08/2018  . Normocytic anemia 08/08/2018  . Diabetes mellitus type II, non insulin dependent ([place]) 08/08/2018  . Bilateral carotid artery disease ([place]) 07/12/2018  . Dyslipidemia 07/12/2018  . Vomiting 07/04/2016  . Gastroenteritis 07/04/2016  . CKD (chronic kidney disease) stage 3, GFR 30-59 ml/min (H[facility]) 07/04/2016  . Occlusion and stenosis of carotid artery 04/28/2010  . ABDOMINAL AORTIC ANEURYSM 04/28/2010  . Peripheral vascular disease ([place]) 12/08/2008  . STENOSIS, MITRAL AND AORTIC VALVES 10/07/2008  . FIBRILLATION, ATRIAL 10/07/2008  . H/O aortic valve replacement 08/14/2008  . HYPERCHOLESTEROLEMIA  IIA 06/23/2008  . HYPERTENSION, MILD 06/23/2008  . MURMUR 06/23/2008    Past Surgical History:  Procedure Laterality Date  . AORTIC VALVE REPLACEMENT    . MYRINGOTOMY WITH TUBE PLACEMENT Bilateral 03/01/2019   Procedure: MYRINGOTOMY WITH  T TUBE PLACEMENT;  Surgeon: Leta Baptist, MD;  Location: [facility];  Service: ENT;  Laterality: Bilateral;  . TOTAL ABDOMINAL HYSTERECTOMY       OB History   No obstetric history on file.     Family History  Problem Relation Age of Onset  . Colon polyps Other   . Heart  disease Mother   . Heart disease Father   . Heart disease Brother     Social History   Tobacco Use  . Smoking status: Former Smoker    Packs/day: 0.25  . Smokeless tobacco: Never Used  Substance Use Topics  . Alcohol use: No  . Drug use: Never    Home Medications Prior to Admission medications   Medication Sig Start Date End Date Taking? Authorizing Provider  amLODipine (NORVASC) 10 MG tablet Take 1 tablet (10 mg total) by mouth daily. 09/01/18   Hongalgi, Lenis Dickinson, MD  aspirin 81 MG tablet Take 81 mg by mouth  daily.      [provider]  Cholecalciferol (VITAMIN D-3) 1000 UNITS CAPS Take 2,000 Units by mouth daily.     [provider]  docusate sodium (COLACE) 100 MG capsule Take 1 capsule (100 mg total) by mouth 2 (two) times daily. 08/31/18   Hongalgi, Lenis Dickinson, MD  loratadine (CLARITIN) 10 MG tablet Take 10 mg by mouth daily.    [provider]  Multiple Vitamin (MULTIVITAMIN) capsule Take 1 capsule by mouth daily.      [provider]  pravastatin (PRAVACHOL) 10 MG tablet Take 1 tablet (10 mg total) by mouth daily. 09/01/18   Hongalgi, Lenis Dickinson, MD  vitamin B-12 1000 MCG tablet Take 1 tablet (1,000 mcg total) by mouth daily. 08/31/18   Hongalgi, Lenis Dickinson, MD  zoledronic acid (RECLAST) 5 MG/100ML SOLN injection Inject 5 mg into the vein See admin instructions. Once a year    [provider]    Allergies    Codeine, Sulfonamide derivatives, and Penicillins  Review of Systems   Review of Systems  All other systems are reviewed and are negative for acute change except as noted in the HPI.   Physical Exam Updated Vital Signs BP (!) 152/78   Pulse (!) 57   Temp 97.6 F (36.4 C) (Oral)   Resp 20   Ht 5\' 5"  (1.651 m)   Wt 54 kg   SpO2 97%   BMI 19.80 kg/m   Physical Exam Vitals and nursing note reviewed.  Constitutional:      General: She is not in acute distress.    Appearance: She is not ill-appearing.  HENT:     Head: Normocephalic.     Jaw: There is normal jaw occlusion.     Comments: Bruising around right eye    Right Ear: Tympanic membrane and external ear normal. No hemotympanum.     Left Ear: Tympanic membrane and external ear normal. No hemotympanum.     Nose: Nose normal.     Right Nostril: No epistaxis or septal hematoma.     Left Nostril: No epistaxis or septal hematoma.     Mouth/Throat:     Mouth: Mucous membranes are moist.     Pharynx: Oropharynx is clear.  Eyes:     General: No scleral icterus.       Right eye: No  discharge.        Left eye: No discharge.     Extraocular Movements: Extraocular movements intact.     Conjunctiva/sclera: Conjunctivae normal.     Pupils: Pupils are equal, round, and reactive to light.     Comments: No pain with EOMs  Neck:     Vascular: No JVD.     Comments: Full ROM intact without spinous process TTP. No bony stepoffs or deformities, no paraspinous muscle TTP or muscle spasms. No rigidity or meningeal  signs. No bruising, erythema, or swelling.  Cardiovascular:     Rate and Rhythm: Normal rate and regular rhythm.     Pulses: Normal pulses.          Radial pulses are 2+ on the right side and 2+ on the left side.     Heart sounds: Normal heart sounds.  Pulmonary:     Comments: Lungs clear to auscultation in all fields. Symmetric chest rise. No wheezing, rales, or rhonchi. Abdominal:     Comments: Abdomen is soft, non-distended, and non-tender in all quadrants. No rigidity, no guarding. No peritoneal signs.  Musculoskeletal:        General: Normal range of motion.     Cervical back: Normal range of motion.     Comments: Pelvis is stable.  Full range of motion of all extremities.  Moving extremities without signs of injury.   No thoracic, or lumbar spinal tenderness to palpation. No paraspinal tenderness. No step offs, crepitus or deformity palpated.   Skin:    General: Skin is warm and dry.     Capillary Refill: Capillary refill takes less than 2 seconds.  Neurological:     Mental Status: She is oriented to person, place, and time.     GCS: GCS eye subscore is 4. GCS verbal subscore is 5. GCS motor subscore is 6.     Comments: Speech is clear and goal oriented, follows commands CN III-XII intact, no facial droop Normal strength in upper and lower extremities bilaterally including dorsiflexion and plantar flexion, strong and equal grip strength Sensation normal to light and sharp touch Moves extremities without ataxia, coordination intact Normal finger to nose  and rapid alternating movements Normal gait and balance   Psychiatric:        Behavior: Behavior normal.       ED Results / Procedures / Treatments   Labs (all labs ordered are listed, but only abnormal results are displayed) Labs Reviewed  BASIC METABOLIC PANEL - Abnormal; Notable for the following components:      Result Value   CO2 21 (*)    Glucose, Bld 207 (*)    BUN 30 (*)    Creatinine, Ser 2.38 (*)    GFR calc non Af Amer 19 (*)    GFR calc Af Amer 23 (*)    All other components within normal limits  URINALYSIS, ROUTINE W REFLEX MICROSCOPIC - Abnormal; Notable for the following components:   Protein, ur 30 (*)    Leukocytes,Ua MODERATE (*)    All other components within normal limits  URINE CULTURE  CBC  CBG MONITORING, ED    EKG None  Radiology CT Head Wo Contrast  Result Date: 10/09/2019 CLINICAL DATA:  Facial trauma EXAM: CT HEAD WITHOUT CONTRAST CT CERVICAL SPINE WITHOUT CONTRAST TECHNIQUE: Multidetector CT imaging of the head and cervical spine was performed following the standard protocol without intravenous contrast. Multiplanar CT image reconstructions of the cervical spine were also generated. COMPARISON:  None. FINDINGS: CT HEAD FINDINGS Brain: No evidence of acute infarction, hemorrhage, hydrocephalus, extra-axial collection or mass lesion/mass effect. Extensive periventricular and deep white matter hypodensity. Nonacute lacunar infarctions of the right caudate head and lentiform nuclei. Vascular: No hyperdense vessel or unexpected calcification. Skull: Normal. Negative for fracture or focal lesion. Sinuses/Orbits: No acute finding. Other: Soft tissue contusion and hematoma of the right forehead. CT CERVICAL SPINE FINDINGS Alignment: Normal. Skull base and vertebrae: No acute fracture. No primary bone lesion or focal pathologic process. Soft tissues  and spinal canal: No prevertebral fluid or swelling. No visible canal hematoma. Disc levels: Mild multilevel disc  space height loss and osteophytosis. Upper chest: Emphysema. Other: None. IMPRESSION: 1.  No acute intracranial pathology. 2. Advanced small-vessel white matter disease. Nonacute lacunar infarctions of the right caudate head and lentiform nuclei. 3.  Soft tissue contusion and hematoma of the right forehead. 4.  No fracture or static subluxation of the cervical spine. 5.  Emphysema (ICD10-J43.9). Electronically Signed   By: Eddie Candle M.D.   On: 10/09/2019 13:30   CT CERVICAL SPINE WO CONTRAST  Result Date: 10/09/2019 CLINICAL DATA:  Facial trauma EXAM: CT HEAD WITHOUT CONTRAST CT CERVICAL SPINE WITHOUT CONTRAST TECHNIQUE: Multidetector CT imaging of the head and cervical spine was performed following the standard protocol without intravenous contrast. Multiplanar CT image reconstructions of the cervical spine were also generated. COMPARISON:  None. FINDINGS: CT HEAD FINDINGS Brain: No evidence of acute infarction, hemorrhage, hydrocephalus, extra-axial collection or mass lesion/mass effect. Extensive periventricular and deep white matter hypodensity. Nonacute lacunar infarctions of the right caudate head and lentiform nuclei. Vascular: No hyperdense vessel or unexpected calcification. Skull: Normal. Negative for fracture or focal lesion. Sinuses/Orbits: No acute finding. Other: Soft tissue contusion and hematoma of the right forehead. CT CERVICAL SPINE FINDINGS Alignment: Normal. Skull base and vertebrae: No acute fracture. No primary bone lesion or focal pathologic process. Soft tissues and spinal canal: No prevertebral fluid or swelling. No visible canal hematoma. Disc levels: Mild multilevel disc space height loss and osteophytosis. Upper chest: Emphysema. Other: None. IMPRESSION: 1.  No acute intracranial pathology. 2. Advanced small-vessel white matter disease. Nonacute lacunar infarctions of the right caudate head and lentiform nuclei. 3.  Soft tissue contusion and hematoma of the right forehead. 4.  No  fracture or static subluxation of the cervical spine. 5.  Emphysema (ICD10-J43.9). Electronically Signed   By: Eddie Candle M.D.   On: 10/09/2019 13:30    Procedures Procedures (including critical care time)  Medications Ordered in ED Medications  sodium chloride flush (NS) 0.9 % injection 3 mL (3 mLs Intravenous Not Given 10/09/19 1917)  bacitracin ointment ( Topical Given 10/09/19 2007)    ED Course  I have reviewed the triage vital signs and the nursing notes.  Pertinent labs & imaging results that were available during my care of the patient were reviewed by me and considered in my medical decision making (see chart for details).    MDM Rules/Calculators/A&P                      Patient seen and examined. Patient presents awake, alert, hemodynamically stable, afebrile, non toxic. On exam she has hematoma and superficial abrasion noted to right forehead and bruising around her right eye.  Abrasion is not amendable to suture repair.  Pupils are equal and reactive.  No pain with EOMs.  Neuro exam is normal.  She ambulates with steady gait.  No signs of serious head, neck or back injury.  Labs were collected in triage.  I viewed results.  No leukocytosis, no anemia.  Kidney function is consistent with baseline.  Hyperglycemia 207 with normal anion gap.  CT head and cervical spine viewed by me are without any acute traumatic findings. UA with moderate leukocytes and 21-50 WBC.  She denies any urinary frequency or dysuria so will send urine culture.  Wound cleaned and bandage applied.  The patient appears reasonably screened and/or stabilized for discharge and I doubt any other  medical condition or other [facility] requiring further screening, evaluation, or treatment in the ED at this time prior to discharge. The patient is safe for discharge with strict return precautions discussed. Recommend close PCP follow-up to have blood sugar rechecked. The patient was discussed with and seen by Dr. Laverta Baltimore who  agrees with the treatment plan.   Portions of this note were generated with [employer]. Dictation errors may occur despite best attempts at proofreading.   Final Clinical Impression(s) / ED Diagnoses Final diagnoses:  Fall, initial encounter    Rx / DC Orders ED Discharge Orders    None       Flint Melter 10/09/19 2026    Margette Fast, MD 10/12/19 [phone]

## 2019-10-10 LAB — URINE CULTURE: Culture: NO GROWTH

## 2019-10-11 ENCOUNTER — Inpatient Hospital Stay (HOSPITAL_COMMUNITY): Admission: RE | Admit: 2019-10-11 | Payer: Medicare HMO | Source: Ambulatory Visit

## 2019-10-11 DIAGNOSIS — E782 Mixed hyperlipidemia: Secondary | ICD-10-CM | POA: Diagnosis not present

## 2019-10-11 DIAGNOSIS — S0093XA Contusion of unspecified part of head, initial encounter: Secondary | ICD-10-CM | POA: Diagnosis not present

## 2019-10-11 DIAGNOSIS — I119 Hypertensive heart disease without heart failure: Secondary | ICD-10-CM | POA: Diagnosis not present

## 2019-10-11 DIAGNOSIS — R55 Syncope and collapse: Secondary | ICD-10-CM | POA: Diagnosis not present

## 2019-10-23 DIAGNOSIS — R69 Illness, unspecified: Secondary | ICD-10-CM | POA: Diagnosis not present

## 2019-10-23 DIAGNOSIS — E1159 Type 2 diabetes mellitus with other circulatory complications: Secondary | ICD-10-CM | POA: Diagnosis not present

## 2019-10-23 DIAGNOSIS — E782 Mixed hyperlipidemia: Secondary | ICD-10-CM | POA: Diagnosis not present

## 2019-10-23 DIAGNOSIS — I119 Hypertensive heart disease without heart failure: Secondary | ICD-10-CM | POA: Diagnosis not present

## 2019-10-23 DIAGNOSIS — N184 Chronic kidney disease, stage 4 (severe): Secondary | ICD-10-CM | POA: Diagnosis not present

## 2019-10-24 ENCOUNTER — Other Ambulatory Visit: Payer: Self-pay

## 2019-10-24 ENCOUNTER — Ambulatory Visit (HOSPITAL_COMMUNITY)
Admission: RE | Admit: 2019-10-24 | Discharge: 2019-10-24 | Disposition: A | Discharge status: Home / Self Care | Payer: Medicare HMO | Source: Ambulatory Visit | Attending: Cardiology | Admitting: Cardiology

## 2019-10-24 ENCOUNTER — Other Ambulatory Visit: Payer: Self-pay | Admitting: Cardiology

## 2019-10-24 DIAGNOSIS — I714 Abdominal aortic aneurysm, without rupture, unspecified: Secondary | ICD-10-CM

## 2019-10-25 ENCOUNTER — Encounter: Payer: Self-pay | Admitting: *Deleted

## 2019-11-13 DIAGNOSIS — S82831D Other fracture of upper and lower end of right fibula, subsequent encounter for closed fracture with routine healing: Secondary | ICD-10-CM | POA: Diagnosis not present

## 2019-11-20 DIAGNOSIS — I480 Paroxysmal atrial fibrillation: Secondary | ICD-10-CM | POA: Diagnosis not present

## 2019-11-20 DIAGNOSIS — I6529 Occlusion and stenosis of unspecified carotid artery: Secondary | ICD-10-CM | POA: Diagnosis not present

## 2019-11-20 DIAGNOSIS — R55 Syncope and collapse: Secondary | ICD-10-CM | POA: Diagnosis not present

## 2019-11-20 DIAGNOSIS — E782 Mixed hyperlipidemia: Secondary | ICD-10-CM | POA: Diagnosis not present

## 2019-11-20 DIAGNOSIS — R69 Illness, unspecified: Secondary | ICD-10-CM | POA: Diagnosis not present

## 2019-11-20 DIAGNOSIS — E1159 Type 2 diabetes mellitus with other circulatory complications: Secondary | ICD-10-CM | POA: Diagnosis not present

## 2019-11-20 DIAGNOSIS — N184 Chronic kidney disease, stage 4 (severe): Secondary | ICD-10-CM | POA: Diagnosis not present

## 2019-11-20 DIAGNOSIS — I119 Hypertensive heart disease without heart failure: Secondary | ICD-10-CM | POA: Diagnosis not present

## 2019-12-25 DIAGNOSIS — S82831D Other fracture of upper and lower end of right fibula, subsequent encounter for closed fracture with routine healing: Secondary | ICD-10-CM | POA: Diagnosis not present

## 2020-01-10 DIAGNOSIS — M810 Age-related osteoporosis without current pathological fracture: Secondary | ICD-10-CM | POA: Diagnosis not present

## 2020-01-10 DIAGNOSIS — R69 Illness, unspecified: Secondary | ICD-10-CM | POA: Diagnosis not present

## 2020-01-10 DIAGNOSIS — N184 Chronic kidney disease, stage 4 (severe): Secondary | ICD-10-CM | POA: Diagnosis not present

## 2020-01-10 DIAGNOSIS — M25571 Pain in right ankle and joints of right foot: Secondary | ICD-10-CM | POA: Diagnosis not present

## 2020-01-10 DIAGNOSIS — I119 Hypertensive heart disease without heart failure: Secondary | ICD-10-CM | POA: Diagnosis not present

## 2020-01-17 DIAGNOSIS — H353132 Nonexudative age-related macular degeneration, bilateral, intermediate dry stage: Secondary | ICD-10-CM | POA: Diagnosis not present

## 2020-01-22 DIAGNOSIS — I739 Peripheral vascular disease, unspecified: Secondary | ICD-10-CM | POA: Diagnosis not present

## 2020-01-22 DIAGNOSIS — R32 Unspecified urinary incontinence: Secondary | ICD-10-CM | POA: Diagnosis not present

## 2020-01-22 DIAGNOSIS — N189 Chronic kidney disease, unspecified: Secondary | ICD-10-CM | POA: Diagnosis not present

## 2020-01-22 DIAGNOSIS — R69 Illness, unspecified: Secondary | ICD-10-CM | POA: Diagnosis not present

## 2020-01-22 DIAGNOSIS — Z008 Encounter for other general examination: Secondary | ICD-10-CM | POA: Diagnosis not present

## 2020-01-22 DIAGNOSIS — E785 Hyperlipidemia, unspecified: Secondary | ICD-10-CM | POA: Diagnosis not present

## 2020-01-22 DIAGNOSIS — R011 Cardiac murmur, unspecified: Secondary | ICD-10-CM | POA: Diagnosis not present

## 2020-01-22 DIAGNOSIS — J302 Other seasonal allergic rhinitis: Secondary | ICD-10-CM | POA: Diagnosis not present

## 2020-01-22 DIAGNOSIS — I129 Hypertensive chronic kidney disease with stage 1 through stage 4 chronic kidney disease, or unspecified chronic kidney disease: Secondary | ICD-10-CM | POA: Diagnosis not present

## 2020-01-22 DIAGNOSIS — K5900 Constipation, unspecified: Secondary | ICD-10-CM | POA: Diagnosis not present

## 2020-02-03 DIAGNOSIS — N189 Chronic kidney disease, unspecified: Secondary | ICD-10-CM | POA: Diagnosis not present

## 2020-02-03 DIAGNOSIS — N184 Chronic kidney disease, stage 4 (severe): Secondary | ICD-10-CM | POA: Diagnosis not present

## 2020-02-03 DIAGNOSIS — E1122 Type 2 diabetes mellitus with diabetic chronic kidney disease: Secondary | ICD-10-CM | POA: Diagnosis not present

## 2020-02-03 DIAGNOSIS — N179 Acute kidney failure, unspecified: Secondary | ICD-10-CM | POA: Diagnosis not present

## 2020-02-03 DIAGNOSIS — D631 Anemia in chronic kidney disease: Secondary | ICD-10-CM | POA: Diagnosis not present

## 2020-02-03 DIAGNOSIS — N2581 Secondary hyperparathyroidism of renal origin: Secondary | ICD-10-CM | POA: Diagnosis not present

## 2020-02-10 DIAGNOSIS — N184 Chronic kidney disease, stage 4 (severe): Secondary | ICD-10-CM | POA: Diagnosis not present

## 2020-02-26 DIAGNOSIS — E1159 Type 2 diabetes mellitus with other circulatory complications: Secondary | ICD-10-CM | POA: Diagnosis not present

## 2020-02-26 DIAGNOSIS — Z23 Encounter for immunization: Secondary | ICD-10-CM | POA: Diagnosis not present

## 2020-02-26 DIAGNOSIS — M810 Age-related osteoporosis without current pathological fracture: Secondary | ICD-10-CM | POA: Diagnosis not present

## 2020-02-26 DIAGNOSIS — I119 Hypertensive heart disease without heart failure: Secondary | ICD-10-CM | POA: Diagnosis not present

## 2020-02-26 DIAGNOSIS — M25571 Pain in right ankle and joints of right foot: Secondary | ICD-10-CM | POA: Diagnosis not present

## 2020-02-26 DIAGNOSIS — N184 Chronic kidney disease, stage 4 (severe): Secondary | ICD-10-CM | POA: Diagnosis not present

## 2020-02-26 DIAGNOSIS — R69 Illness, unspecified: Secondary | ICD-10-CM | POA: Diagnosis not present

## 2020-05-19 ENCOUNTER — Other Ambulatory Visit: Payer: Self-pay

## 2020-05-19 ENCOUNTER — Ambulatory Visit (HOSPITAL_COMMUNITY)
Admission: RE | Admit: 2020-05-19 | Discharge: 2020-05-19 | Disposition: A | Discharge status: Home / Self Care | Payer: Medicare HMO | Source: Ambulatory Visit | Attending: Cardiology | Admitting: Cardiology

## 2020-05-19 ENCOUNTER — Other Ambulatory Visit: Payer: Self-pay | Admitting: Cardiology

## 2020-05-19 DIAGNOSIS — I714 Abdominal aortic aneurysm, without rupture, unspecified: Secondary | ICD-10-CM

## 2020-05-28 ENCOUNTER — Ambulatory Visit: Payer: Medicare HMO | Admitting: Cardiology

## 2020-06-02 DIAGNOSIS — N179 Acute kidney failure, unspecified: Secondary | ICD-10-CM | POA: Diagnosis not present

## 2020-06-02 DIAGNOSIS — D631 Anemia in chronic kidney disease: Secondary | ICD-10-CM | POA: Diagnosis not present

## 2020-06-02 DIAGNOSIS — N2581 Secondary hyperparathyroidism of renal origin: Secondary | ICD-10-CM | POA: Diagnosis not present

## 2020-06-02 DIAGNOSIS — N184 Chronic kidney disease, stage 4 (severe): Secondary | ICD-10-CM | POA: Diagnosis not present

## 2020-06-02 DIAGNOSIS — E1122 Type 2 diabetes mellitus with diabetic chronic kidney disease: Secondary | ICD-10-CM | POA: Diagnosis not present

## 2020-06-02 DIAGNOSIS — N189 Chronic kidney disease, unspecified: Secondary | ICD-10-CM | POA: Diagnosis not present

## 2020-06-02 DIAGNOSIS — Z720 Tobacco use: Secondary | ICD-10-CM | POA: Diagnosis not present

## 2020-06-14 NOTE — Progress Notes (Deleted)
Cardiology Office Note   Date:  06/14/2020   ID:  Sandra Sheppard, DOB [birth year], MRN [id number]  PCP:  Prince Solian, MD  Cardiologist:   Minus Breeding, MD   No chief complaint on file.     History of Present Illness: Sandra Sheppard is a 75 y.o. female who presents for followup after aortic valve replacement. ***   *** She was in the hospital in February.  I reviewed these records for this visit.  She apparently presented to her primary provider and sounds like she had altered mental status and was found to have dehydration.  There was mention of nausea vomiting and diarrhea.  She had acute renal insufficiency.  She does not recall any of this.  She went to a nursing home she says for 8 weeks afterwards and then 2 weeks with her sister but is back home now.  She says her family is unhappy because she is home.  She has her little dog that she walks. The patient denies any new symptoms such as chest discomfort, neck or arm discomfort. There has been no new shortness of breath, PND or orthopnea. There have been no reported palpitations, presyncope or syncope.    Past Medical History:  Diagnosis Date  . ABDOMINAL AORTIC ANEURYSM   . Atrial fibrillation ([place])   . CAROTID STENOSIS   . CKD (chronic kidney disease), stage V ([place])   . Complication of anesthesia   . Confusion   . CVD (cardiovascular disease)   . Diabetes ([place])    no meds at present time  . FIBRILLATION, ATRIAL   . HYPERCHOLESTEROLEMIA  IIA   . HYPERTENSION, MILD   . MURMUR   . Osteoporosis   . PERIPHERAL VASCULAR DISEASE   . PONV (postoperative nausea and vomiting)   . STENOSIS, MITRAL AND AORTIC VALVES     Past Surgical History:  Procedure Laterality Date  . AORTIC VALVE REPLACEMENT    . MYRINGOTOMY WITH TUBE PLACEMENT Bilateral 03/01/2019   Procedure: MYRINGOTOMY WITH  T TUBE PLACEMENT;  Surgeon: Leta Baptist, MD;  Location: [facility];  Service: ENT;  Laterality: Bilateral;  . TOTAL ABDOMINAL  HYSTERECTOMY       Current Outpatient Medications  Medication Sig Dispense Refill  . amLODipine (NORVASC) 10 MG tablet Take 1 tablet (10 mg total) by mouth daily.    Marland Kitchen aspirin 81 MG tablet Take 81 mg by mouth daily.      . Cholecalciferol (VITAMIN D-3) 1000 UNITS CAPS Take 2,000 Units by mouth daily.     Marland Kitchen docusate sodium (COLACE) 100 MG capsule Take 1 capsule (100 mg total) by mouth 2 (two) times daily.    Marland Kitchen loratadine (CLARITIN) 10 MG tablet Take 10 mg by mouth daily.    . Multiple Vitamin (MULTIVITAMIN) capsule Take 1 capsule by mouth daily.      . pravastatin (PRAVACHOL) 10 MG tablet Take 1 tablet (10 mg total) by mouth daily.    . vitamin B-12 1000 MCG tablet Take 1 tablet (1,000 mcg total) by mouth daily.    . zoledronic acid (RECLAST) 5 MG/100ML SOLN injection Inject 5 mg into the vein See admin instructions. Once a year     No current facility-administered medications for this visit.    Allergies:   Codeine, Sulfonamide derivatives, and Penicillins    ROS:  Please see the history of present illness.   Otherwise, review of systems are positive for ***.   All other systems are  reviewed and negative.    PHYSICAL EXAM: VS:  There were no vitals taken for this visit. , BMI There is no height or weight on file to calculate BMI. GENERAL:  Well appearing NECK:  No jugular venous distention, waveform within normal limits, carotid upstroke brisk and symmetric, no bruits, no thyromegaly LUNGS:  Clear to auscultation bilaterally CHEST:  Unremarkable HEART:  PMI not displaced or sustained,S1 and S2 within normal limits, no S3, no S4, no clicks, no rubs, *** murmurs ABD:  Flat, positive bowel sounds normal in frequency in pitch, no bruits, no rebound, no guarding, no midline pulsatile mass, no hepatomegaly, no splenomegaly EXT:  2 plus pulses throughout, no edema, no cyanosis no clubbing    ***GENERAL:  Well appearing NECK:  No jugular venous distention, waveform within normal limits,  carotid upstroke brisk and symmetric, no bruits, no thyromegaly LUNGS:  Clear to auscultation bilaterally CHEST:  Unremarkable HEART:  PMI not displaced or sustained,S1 and S2 within normal limits, no S3, no S4, no clicks, no rubs, 2 out of 6 brief apical systolic murmur radiating slightly at the aortic outflow tract, no diastolic murmurs ABD:  Flat, positive bowel sounds normal in frequency in pitch, no bruits, no rebound, no guarding, no midline pulsatile mass, no hepatomegaly, no splenomegaly EXT:  2 plus pulses right radial, absent left radial, diminished mild dorsalis pedis and posterior tibialis bilateral, no edema, no cyanosis no clubbing  EKG:  EKG is *** ordered today. The ekg ordered today demonstrates sinus rhythm, rate ***, axis within normal limits, intervals within normal limits, nonspecific lateral T wave flattening.   Recent Labs: 10/09/2019: BUN 30; Creatinine, Ser 2.38; Hemoglobin 12.1; Platelets 221; Potassium 4.5; Sodium 136    Lipid Panel No results found for: CHOL, TRIG, HDL, CHOLHDL, VLDL, LDLCALC, LDLDIRECT    Wt Readings from Last 3 Encounters:  10/09/19 119 lb (54 kg)  10/01/19 119 lb (54 kg)  03/01/19 120 lb 2.4 oz (54.5 kg)      Other studies Reviewed: Additional studies/ records that were reviewed today include:  *** Review of the above records demonstrates:  Please see elsewhere in the note.     ASSESSMENT AND PLAN:  AORTIC VALVE REPLACEMENT, HX OF - She had mild stenosis in 2018.  ***  I would not suspect that this is worse clinically.  No further imaging is indicated at this point.  ABDOMINAL AORTIC ANEURYSM - This was 3.3 x 3 in Dec 2018.  She was to have follow up.  *** needs follow up.  I will arrange follow-up ultrasound.  DYSLIPIDEMIA - *** Her LDL was slightly above target at 101 with an HDL of 36.  However, she has been sensitive to meds and has not tolerated up titration.  No change in therapy.  HYPERTENSION, MILD -  Her blood  pressure is *** at target.  No change in therapy. .  CAROTID STENOSIS - This was mild in Jan 2019.  ***    No indication for repeat imaging at this point.   CKD IIIB - Creat is ***  COVID EDUCATION -  She has had her vaccines.   Current medicines are reviewed at length with the patient today.  The patient does not have concerns regarding medicines.  The following changes have been made:  no change  Labs/ tests ordered today include:   No orders of the defined types were placed in this encounter.    Disposition:   FU with me in six months.  Signed, Minus Breeding, MD  06/14/2020 9:35 PM    [facility]

## 2020-06-16 ENCOUNTER — Ambulatory Visit: Payer: Medicare HMO | Admitting: Cardiology

## 2020-06-16 DIAGNOSIS — I714 Abdominal aortic aneurysm, without rupture: Secondary | ICD-10-CM

## 2020-06-16 DIAGNOSIS — N1832 Chronic kidney disease, stage 3b: Secondary | ICD-10-CM

## 2020-06-16 DIAGNOSIS — Z952 Presence of prosthetic heart valve: Secondary | ICD-10-CM

## 2020-06-16 DIAGNOSIS — E785 Hyperlipidemia, unspecified: Secondary | ICD-10-CM

## 2020-06-16 DIAGNOSIS — I10 Essential (primary) hypertension: Secondary | ICD-10-CM

## 2020-07-09 NOTE — Progress Notes (Signed)
Cardiology Office Note   Date:  07/10/2020   ID:  Sandra Sheppard, DOB 09-02-1945, MRN [id number]  PCP:  Prince Solian, MD  Cardiologist:   Minus Breeding, MD   Chief Complaint  Patient presents with  . Aortic Stenosis      History of Present Illness: Sandra Sheppard is a 75 y.o. female who presents for followup after aortic valve replacement. She has done well.  The patient denies any new symptoms such as chest discomfort, neck or arm discomfort. There has been no new shortness of breath, PND or orthopnea. There have been no reported palpitations, presyncope or syncope.    Past Medical History:  Diagnosis Date  . ABDOMINAL AORTIC ANEURYSM   . Atrial fibrillation ([place])   . CAROTID STENOSIS   . CKD (chronic kidney disease), stage V ([place])   . Complication of anesthesia   . Confusion   . CVD (cardiovascular disease)   . Diabetes ([place])    no meds at present time  . FIBRILLATION, ATRIAL   . HYPERCHOLESTEROLEMIA  IIA   . HYPERTENSION, MILD   . MURMUR   . Osteoporosis   . PERIPHERAL VASCULAR DISEASE   . PONV (postoperative nausea and vomiting)   . STENOSIS, MITRAL AND AORTIC VALVES     Past Surgical History:  Procedure Laterality Date  . AORTIC VALVE REPLACEMENT     08/14/2008.  Dr. Roxy Manns  . MYRINGOTOMY WITH TUBE PLACEMENT Bilateral 03/01/2019   Procedure: MYRINGOTOMY WITH  T TUBE PLACEMENT;  Surgeon: Leta Baptist, MD;  Location: [place];  Service: ENT;  Laterality: Bilateral;  . TOTAL ABDOMINAL HYSTERECTOMY       Current Outpatient Medications  Medication Sig Dispense Refill  . amLODipine (NORVASC) 10 MG tablet Take 1 tablet (10 mg total) by mouth daily.    Marland Kitchen aspirin 81 MG tablet Take 81 mg by mouth daily.    . Cholecalciferol (VITAMIN D-3) 1000 UNITS CAPS Take 2,000 Units by mouth daily.     Marland Kitchen docusate sodium (COLACE) 100 MG capsule Take 1 capsule (100 mg total) by mouth 2 (two) times daily.    Marland Kitchen loratadine (CLARITIN) 10 MG tablet Take 10 mg by mouth  daily.    . Multiple Vitamin (MULTIVITAMIN) capsule Take 1 capsule by mouth daily.    . pravastatin (PRAVACHOL) 10 MG tablet Take 1 tablet (10 mg total) by mouth daily.    . vitamin B-12 1000 MCG tablet Take 1 tablet (1,000 mcg total) by mouth daily.    . zoledronic acid (RECLAST) 5 MG/100ML SOLN injection Inject 5 mg into the vein See admin instructions. Once a year     No current facility-administered medications for this visit.    Allergies:   Codeine, Sulfonamide derivatives, and Penicillins    ROS:  Please see the history of present illness.   Otherwise, review of systems are positive for none.   All other systems are reviewed and negative.    PHYSICAL EXAM: VS:  BP 116/60   Pulse 66   Ht '5\' 5"'$  (1.651 m)   Wt 116 lb (52.6 kg)   BMI 19.30 kg/m  , BMI Body mass index is 19.3 kg/m. GENERAL:  Well appearing NECK:  No jugular venous distention, waveform within normal limits, carotid upstroke brisk and symmetric, no bruits, no thyromegaly LUNGS:  Clear to auscultation bilaterally CHEST:  Unremarkable HEART:  PMI not displaced or sustained,S1 and S2 within normal limits, no S3, no S4, no clicks, no rubs,  2 out of 6 brief apical systolic murmur radiating slightly at aortic outflow tract, no diastolic murmurs ABD:  Flat, positive bowel sounds normal in frequency in pitch, no bruits, no rebound, no guarding, no midline pulsatile mass, no hepatomegaly, no splenomegaly EXT:  2 plus pulses upper and decreased dorsalis pedis and posterior tibials bilateral, no edema, no cyanosis no clubbing   EKG:  EKG is  ordered today. The ekg ordered today demonstrates sinus rhythm, rate 66, axis within normal limits, intervals within normal limits, nonspecific lateral T wave inversion in the lateral leads slightly more pronounced than previous.   Recent Labs: 10/09/2019: BUN 30; Creatinine, Ser 2.38; Hemoglobin 12.1; Platelets 221; Potassium 4.5; Sodium 136    Lipid Panel No results found for:  CHOL, TRIG, HDL, CHOLHDL, VLDL, LDLCALC, LDLDIRECT    Wt Readings from Last 3 Encounters:  07/10/20 116 lb (52.6 kg)  10/09/19 119 lb (54 kg)  10/01/19 119 lb (54 kg)      Other studies Reviewed: Additional studies/ records that were reviewed today include: Imaging is reviewed below Review of the above records demonstrates:  Please see elsewhere in the note.     ASSESSMENT AND PLAN:  AORTIC VALVE REPLACEMENT, HX OF - She had mild stenosis in 2018. I will follow up with an echo in December as it will have been for years.  ABDOMINAL AORTIC ANEURYSM - This was 3. 5 in December 2021 and this will be followed up again next December. This was slightly larger than previous.   DYSLIPIDEMIA - LDL is 75 with an HDL of 38. No change in therapy.  HYPERTENSION, MILD -  Her blood pressure is controlled. No change in therapy.  CAROTID STENOSIS - This was mild in Jan 2019. I will follow this in December with her other imaging.  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 She is followed by nephrology.  COVID EDUCATION -  She has had her vaccines.   Current medicines are reviewed at length with the patient today.  The patient does not have concerns regarding medicines.  The following changes have been made:  no change  Labs/ tests ordered today include:   Orders Placed This Encounter  Procedures  . EKG 12-Lead  . ECHOCARDIOGRAM COMPLETE  . VAS US CAROTID  . VAS [place] AAA DUPLEX     Disposition:   FU with me in six months.     Signed, Minus Breeding, MD  07/10/2020 10:09 AM    [facility]

## 2020-07-10 ENCOUNTER — Ambulatory Visit (INDEPENDENT_AMBULATORY_CARE_PROVIDER_SITE_OTHER): Payer: Medicare HMO | Admitting: Cardiology

## 2020-07-10 ENCOUNTER — Encounter: Payer: Self-pay | Admitting: Cardiology

## 2020-07-10 ENCOUNTER — Other Ambulatory Visit: Payer: Self-pay

## 2020-07-10 VITALS — BP 116/60 | HR 66 | Ht 65.0 in | Wt 116.0 lb

## 2020-07-10 DIAGNOSIS — I779 Disorder of arteries and arterioles, unspecified: Secondary | ICD-10-CM | POA: Diagnosis not present

## 2020-07-10 DIAGNOSIS — N1831 Chronic kidney disease, stage 3a: Secondary | ICD-10-CM | POA: Diagnosis not present

## 2020-07-10 DIAGNOSIS — I714 Abdominal aortic aneurysm, without rupture, unspecified: Secondary | ICD-10-CM

## 2020-07-10 DIAGNOSIS — Z952 Presence of prosthetic heart valve: Secondary | ICD-10-CM | POA: Diagnosis not present

## 2020-07-10 DIAGNOSIS — E785 Hyperlipidemia, unspecified: Secondary | ICD-10-CM

## 2020-07-10 DIAGNOSIS — I080 Rheumatic disorders of both mitral and aortic valves: Secondary | ICD-10-CM | POA: Diagnosis not present

## 2020-07-10 DIAGNOSIS — I6523 Occlusion and stenosis of bilateral carotid arteries: Secondary | ICD-10-CM | POA: Diagnosis not present

## 2020-07-10 DIAGNOSIS — I10 Essential (primary) hypertension: Secondary | ICD-10-CM | POA: Diagnosis not present

## 2020-07-10 NOTE — Patient Instructions (Signed)
Medication Instructions:  No changes *If you need a refill on your cardiac medications before your next appointment, please call your pharmacy*  Testing/Procedures: Your physician has requested that you have a carotid duplex in December. This test is an ultrasound of the carotid arteries in your neck. It looks at blood flow through these arteries that supply the brain with blood. Allow one hour for this exam. There are no restrictions or special instructions.  Your physician has requested that you have an abdominal aorta duplex in December. During this test, an ultrasound is used to evaluate the aorta. Allow 30 minutes for this exam. Do not eat after midnight the day before and avoid carbonated beverages  Your physician has requested that you have an echocardiogram in December. Echocardiography is a painless test that uses sound waves to create images of your heart. It provides your doctor with information about the size and shape of your heart and how well your heart's chambers and valves are working. This procedure takes approximately one hour. There are no restrictions for this procedure.  Follow-Up: At [facility], you and your health needs are our priority.  As part of our continuing mission to provide you with exceptional heart care, we have created designated Provider Care Teams.  These Care Teams include your primary Cardiologist (physician) and Advanced Practice Providers (APPs -  Physician Assistants and Nurse Practitioners) who all work together to provide you with the care you need, when you need it.   Your next appointment:   12 month(s)  You will receive a reminder letter in the mail two months in advance. If you don't receive a letter, please call our office to schedule the follow-up appointment.  The format for your next appointment:   In Person  Provider:   Minus Breeding, MD

## 2020-07-28 DIAGNOSIS — E538 Deficiency of other specified B group vitamins: Secondary | ICD-10-CM | POA: Diagnosis not present

## 2020-07-30 DIAGNOSIS — E559 Vitamin D deficiency, unspecified: Secondary | ICD-10-CM | POA: Diagnosis not present

## 2020-07-30 DIAGNOSIS — E1159 Type 2 diabetes mellitus with other circulatory complications: Secondary | ICD-10-CM | POA: Diagnosis not present

## 2020-07-30 DIAGNOSIS — E782 Mixed hyperlipidemia: Secondary | ICD-10-CM | POA: Diagnosis not present

## 2020-08-07 DIAGNOSIS — N184 Chronic kidney disease, stage 4 (severe): Secondary | ICD-10-CM | POA: Diagnosis not present

## 2020-08-07 DIAGNOSIS — E1159 Type 2 diabetes mellitus with other circulatory complications: Secondary | ICD-10-CM | POA: Diagnosis not present

## 2020-08-07 DIAGNOSIS — I6529 Occlusion and stenosis of unspecified carotid artery: Secondary | ICD-10-CM | POA: Diagnosis not present

## 2020-08-07 DIAGNOSIS — Z0000 Encounter for general adult medical examination without abnormal findings: Secondary | ICD-10-CM | POA: Diagnosis not present

## 2020-08-07 DIAGNOSIS — M25571 Pain in right ankle and joints of right foot: Secondary | ICD-10-CM | POA: Diagnosis not present

## 2020-08-07 DIAGNOSIS — I480 Paroxysmal atrial fibrillation: Secondary | ICD-10-CM | POA: Diagnosis not present

## 2020-08-07 DIAGNOSIS — R82998 Other abnormal findings in urine: Secondary | ICD-10-CM | POA: Diagnosis not present

## 2020-08-07 DIAGNOSIS — I119 Hypertensive heart disease without heart failure: Secondary | ICD-10-CM | POA: Diagnosis not present

## 2020-08-07 DIAGNOSIS — M810 Age-related osteoporosis without current pathological fracture: Secondary | ICD-10-CM | POA: Diagnosis not present

## 2020-08-07 DIAGNOSIS — R69 Illness, unspecified: Secondary | ICD-10-CM | POA: Diagnosis not present

## 2020-08-07 DIAGNOSIS — I10 Essential (primary) hypertension: Secondary | ICD-10-CM | POA: Diagnosis not present

## 2020-08-13 ENCOUNTER — Other Ambulatory Visit: Payer: Self-pay

## 2020-08-13 ENCOUNTER — Inpatient Hospital Stay (HOSPITAL_COMMUNITY)
Admission: EM | Admit: 2020-08-13 | Discharge: 2020-09-01 | DRG: 312 | Disposition: A | Discharge status: Skilled Nursing Facility | Payer: Medicare HMO | Attending: Internal Medicine | Admitting: Internal Medicine

## 2020-08-13 ENCOUNTER — Encounter (HOSPITAL_COMMUNITY): Payer: Self-pay

## 2020-08-13 ENCOUNTER — Emergency Department (HOSPITAL_COMMUNITY): Payer: Medicare HMO

## 2020-08-13 DIAGNOSIS — G9341 Metabolic encephalopathy: Secondary | ICD-10-CM | POA: Diagnosis present

## 2020-08-13 DIAGNOSIS — Z20822 Contact with and (suspected) exposure to covid-19: Secondary | ICD-10-CM | POA: Diagnosis present

## 2020-08-13 DIAGNOSIS — I951 Orthostatic hypotension: Principal | ICD-10-CM | POA: Diagnosis present

## 2020-08-13 DIAGNOSIS — F32A Depression, unspecified: Secondary | ICD-10-CM | POA: Diagnosis present

## 2020-08-13 DIAGNOSIS — Z751 Person awaiting admission to adequate facility elsewhere: Secondary | ICD-10-CM

## 2020-08-13 DIAGNOSIS — I480 Paroxysmal atrial fibrillation: Secondary | ICD-10-CM | POA: Diagnosis present

## 2020-08-13 DIAGNOSIS — Z953 Presence of xenogenic heart valve: Secondary | ICD-10-CM

## 2020-08-13 DIAGNOSIS — I499 Cardiac arrhythmia, unspecified: Secondary | ICD-10-CM | POA: Diagnosis not present

## 2020-08-13 DIAGNOSIS — R55 Syncope and collapse: Secondary | ICD-10-CM

## 2020-08-13 DIAGNOSIS — R296 Repeated falls: Secondary | ICD-10-CM | POA: Diagnosis present

## 2020-08-13 DIAGNOSIS — E1122 Type 2 diabetes mellitus with diabetic chronic kidney disease: Secondary | ICD-10-CM | POA: Diagnosis present

## 2020-08-13 DIAGNOSIS — E119 Type 2 diabetes mellitus without complications: Secondary | ICD-10-CM

## 2020-08-13 DIAGNOSIS — R69 Illness, unspecified: Secondary | ICD-10-CM | POA: Diagnosis not present

## 2020-08-13 DIAGNOSIS — F419 Anxiety disorder, unspecified: Secondary | ICD-10-CM | POA: Diagnosis present

## 2020-08-13 DIAGNOSIS — N179 Acute kidney failure, unspecified: Secondary | ICD-10-CM | POA: Diagnosis present

## 2020-08-13 DIAGNOSIS — I081 Rheumatic disorders of both mitral and tricuspid valves: Secondary | ICD-10-CM | POA: Diagnosis present

## 2020-08-13 DIAGNOSIS — R29818 Other symptoms and signs involving the nervous system: Secondary | ICD-10-CM | POA: Diagnosis not present

## 2020-08-13 DIAGNOSIS — F0390 Unspecified dementia without behavioral disturbance: Secondary | ICD-10-CM | POA: Diagnosis present

## 2020-08-13 DIAGNOSIS — N184 Chronic kidney disease, stage 4 (severe): Secondary | ICD-10-CM | POA: Diagnosis present

## 2020-08-13 DIAGNOSIS — R42 Dizziness and giddiness: Secondary | ICD-10-CM | POA: Diagnosis not present

## 2020-08-13 DIAGNOSIS — Z681 Body mass index (BMI) 19 or less, adult: Secondary | ICD-10-CM

## 2020-08-13 DIAGNOSIS — I959 Hypotension, unspecified: Secondary | ICD-10-CM | POA: Diagnosis not present

## 2020-08-13 DIAGNOSIS — E43 Unspecified severe protein-calorie malnutrition: Secondary | ICD-10-CM | POA: Insufficient documentation

## 2020-08-13 DIAGNOSIS — Z885 Allergy status to narcotic agent status: Secondary | ICD-10-CM

## 2020-08-13 DIAGNOSIS — Z880 Allergy status to penicillin: Secondary | ICD-10-CM

## 2020-08-13 DIAGNOSIS — I700 Atherosclerosis of aorta: Secondary | ICD-10-CM | POA: Diagnosis present

## 2020-08-13 DIAGNOSIS — F05 Delirium due to known physiological condition: Secondary | ICD-10-CM | POA: Diagnosis present

## 2020-08-13 DIAGNOSIS — D649 Anemia, unspecified: Secondary | ICD-10-CM | POA: Diagnosis present

## 2020-08-13 DIAGNOSIS — E1151 Type 2 diabetes mellitus with diabetic peripheral angiopathy without gangrene: Secondary | ICD-10-CM | POA: Diagnosis present

## 2020-08-13 DIAGNOSIS — I6782 Cerebral ischemia: Secondary | ICD-10-CM | POA: Diagnosis not present

## 2020-08-13 DIAGNOSIS — M810 Age-related osteoporosis without current pathological fracture: Secondary | ICD-10-CM | POA: Diagnosis present

## 2020-08-13 DIAGNOSIS — G319 Degenerative disease of nervous system, unspecified: Secondary | ICD-10-CM | POA: Diagnosis not present

## 2020-08-13 DIAGNOSIS — R410 Disorientation, unspecified: Secondary | ICD-10-CM

## 2020-08-13 DIAGNOSIS — E871 Hypo-osmolality and hyponatremia: Secondary | ICD-10-CM | POA: Diagnosis present

## 2020-08-13 DIAGNOSIS — R4182 Altered mental status, unspecified: Secondary | ICD-10-CM | POA: Diagnosis not present

## 2020-08-13 DIAGNOSIS — I639 Cerebral infarction, unspecified: Secondary | ICD-10-CM | POA: Diagnosis not present

## 2020-08-13 DIAGNOSIS — Z743 Need for continuous supervision: Secondary | ICD-10-CM | POA: Diagnosis not present

## 2020-08-13 DIAGNOSIS — Z79899 Other long term (current) drug therapy: Secondary | ICD-10-CM

## 2020-08-13 DIAGNOSIS — I129 Hypertensive chronic kidney disease with stage 1 through stage 4 chronic kidney disease, or unspecified chronic kidney disease: Secondary | ICD-10-CM | POA: Diagnosis present

## 2020-08-13 DIAGNOSIS — Z8249 Family history of ischemic heart disease and other diseases of the circulatory system: Secondary | ICD-10-CM

## 2020-08-13 DIAGNOSIS — E785 Hyperlipidemia, unspecified: Secondary | ICD-10-CM | POA: Diagnosis present

## 2020-08-13 DIAGNOSIS — Z8673 Personal history of transient ischemic attack (TIA), and cerebral infarction without residual deficits: Secondary | ICD-10-CM

## 2020-08-13 DIAGNOSIS — R5381 Other malaise: Secondary | ICD-10-CM | POA: Diagnosis present

## 2020-08-13 DIAGNOSIS — Z7982 Long term (current) use of aspirin: Secondary | ICD-10-CM

## 2020-08-13 DIAGNOSIS — F17210 Nicotine dependence, cigarettes, uncomplicated: Secondary | ICD-10-CM | POA: Diagnosis present

## 2020-08-13 DIAGNOSIS — I714 Abdominal aortic aneurysm, without rupture: Secondary | ICD-10-CM | POA: Diagnosis present

## 2020-08-13 LAB — COMPREHENSIVE METABOLIC PANEL
ALT: 8 U/L (ref 0–44)
AST: 14 U/L — ABNORMAL LOW (ref 15–41)
Albumin: 3.4 g/dL — ABNORMAL LOW (ref 3.5–5.0)
Alkaline Phosphatase: 84 U/L (ref 38–126)
Anion gap: 11 (ref 5–15)
BUN: 23 mg/dL (ref 8–23)
CO2: 17 mmol/L — ABNORMAL LOW (ref 22–32)
Calcium: 8.6 mg/dL — ABNORMAL LOW (ref 8.9–10.3)
Chloride: 105 mmol/L (ref 98–111)
Creatinine, Ser: 2.54 mg/dL — ABNORMAL HIGH (ref 0.44–1.00)
GFR, Estimated: 19 mL/min — ABNORMAL LOW (ref >60)
Glucose, Bld: 183 mg/dL — ABNORMAL HIGH (ref 70–99)
Potassium: 4 mmol/L (ref 3.5–5.1)
Sodium: 133 mmol/L — ABNORMAL LOW (ref 135–145)
Total Bilirubin: 0.6 mg/dL (ref 0.3–1.2)
Total Protein: 6.5 g/dL (ref 6.5–8.1)

## 2020-08-13 LAB — URINALYSIS, ROUTINE W REFLEX MICROSCOPIC
Bilirubin Urine: NEGATIVE
Glucose, UA: NEGATIVE mg/dL
Hgb urine dipstick: NEGATIVE
Ketones, ur: NEGATIVE mg/dL
Leukocytes,Ua: NEGATIVE
Nitrite: NEGATIVE
Protein, ur: NEGATIVE mg/dL
Specific Gravity, Urine: 1.009 (ref 1.005–1.030)
pH: 5 (ref 5.0–8.0)

## 2020-08-13 LAB — CBC WITH DIFFERENTIAL/PLATELET
Abs Immature Granulocytes: 0.03 10*3/uL (ref 0.00–0.07)
Basophils Absolute: 0.1 10*3/uL (ref 0.0–0.1)
Basophils Relative: 1 %
Eosinophils Absolute: 0.1 10*3/uL (ref 0.0–0.5)
Eosinophils Relative: 1 %
HCT: 36.8 % (ref 36.0–46.0)
Hemoglobin: 11.9 g/dL — ABNORMAL LOW (ref 12.0–15.0)
Immature Granulocytes: 1 %
Lymphocytes Relative: 21 %
Lymphs Abs: 1.3 10*3/uL (ref 0.7–4.0)
MCH: 28.9 pg (ref 26.0–34.0)
MCHC: 32.3 g/dL (ref 30.0–36.0)
MCV: 89.3 fL (ref 80.0–100.0)
Monocytes Absolute: 0.6 10*3/uL (ref 0.1–1.0)
Monocytes Relative: 9 %
Neutro Abs: 4.2 10*3/uL (ref 1.7–7.7)
Neutrophils Relative %: 67 %
Platelets: 174 10*3/uL (ref 150–400)
RBC: 4.12 MIL/uL (ref 3.87–5.11)
RDW: 13.2 % (ref 11.5–15.5)
WBC: 6.3 10*3/uL (ref 4.0–10.5)
nRBC: 0 % (ref 0.0–0.2)

## 2020-08-13 LAB — CBG MONITORING, ED: Glucose-Capillary: 172 mg/dL — ABNORMAL HIGH (ref 70–99)

## 2020-08-13 LAB — TROPONIN I (HIGH SENSITIVITY)
Troponin I (High Sensitivity): 9 ng/L (ref <18)
Troponin I (High Sensitivity): 10 ng/L (ref <18)

## 2020-08-13 LAB — ETHANOL: Alcohol, Ethyl (B): <10 mg/dL (ref <10)

## 2020-08-13 LAB — PROTIME-INR
INR: 1.1 (ref 0.8–1.2)
Prothrombin Time: 13.4 seconds (ref 11.4–15.2)

## 2020-08-13 IMAGING — CT CT HEAD W/O CM
4 series · 17 of 47 positions shown, 19 images · non-contrast
Comparison: [DATE]

CLINICAL DATA: Mental status change

EXAM:
CT HEAD WITHOUT CONTRAST
TECHNIQUE: Contiguous axial images were obtained from the base of the skull
through the vertex without intravenous contrast.

[Series 3: head wo · axial · 0.43mm/px · z∈[+1220,+1340]mm · 7 of 33 slices shown, 9 images]
[im 5/33  brain]
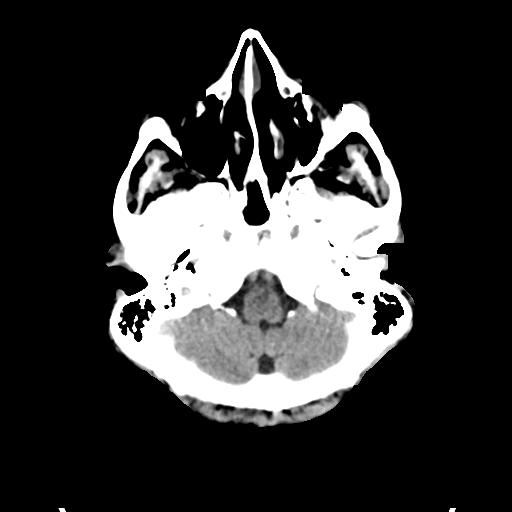
[im 5/33  bone]
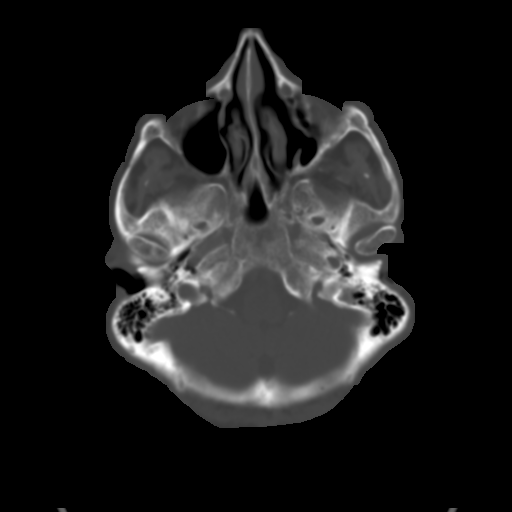
[im 9/33  brain]
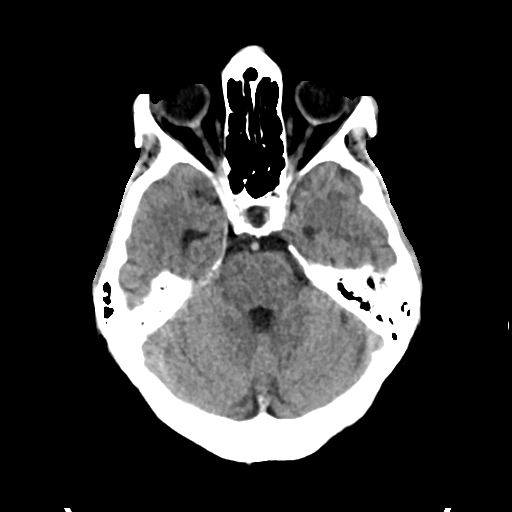
[im 13/33  brain]
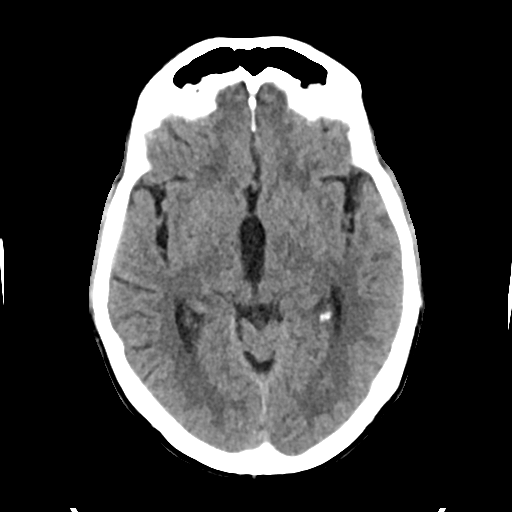
[im 17/33  brain]
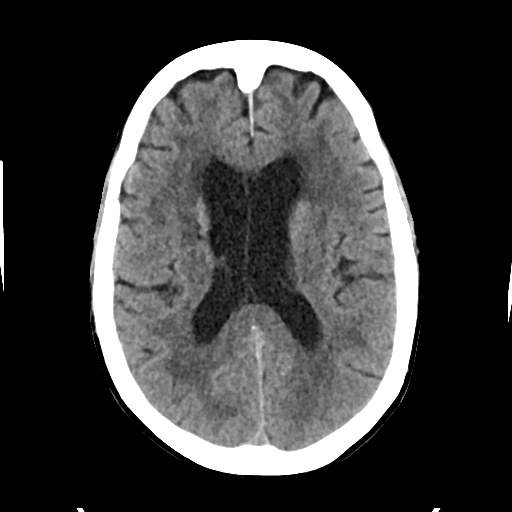
[im 21/33  brain]
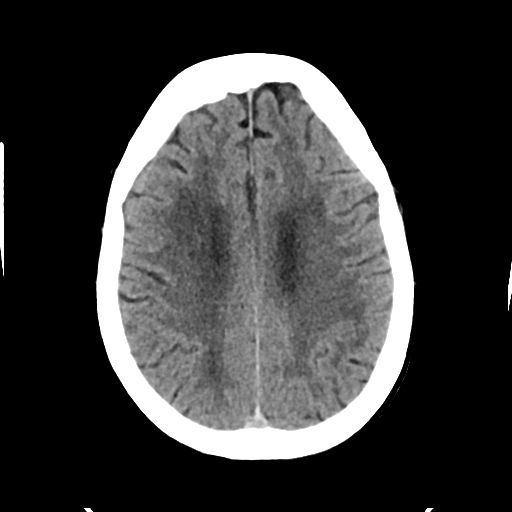
[im 21/33  bone]
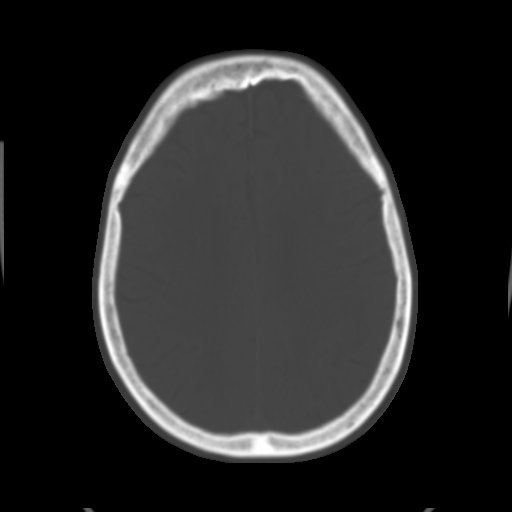
[im 25/33  brain]
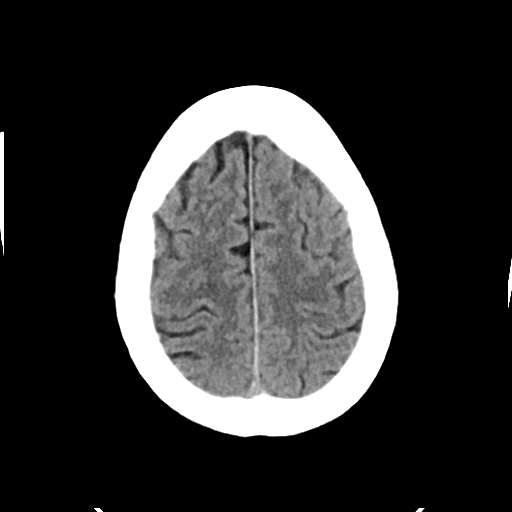
[im 29/33  brain]
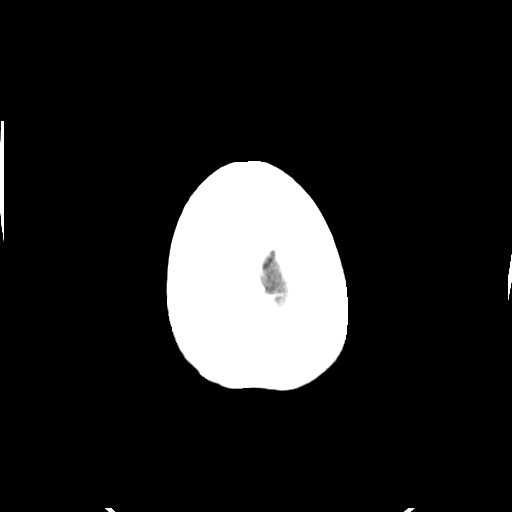

[Series 4: head bone · axial · 0.43mm/px · z∈[+1216,+1272]mm · 4 of 81 slices shown]
[im 9/81  bone]
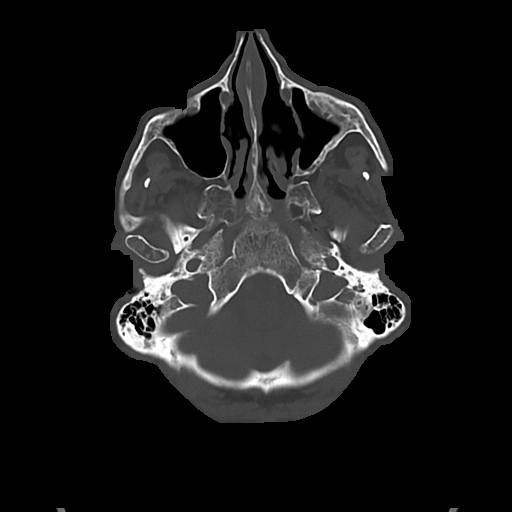
[im 17/81  bone]
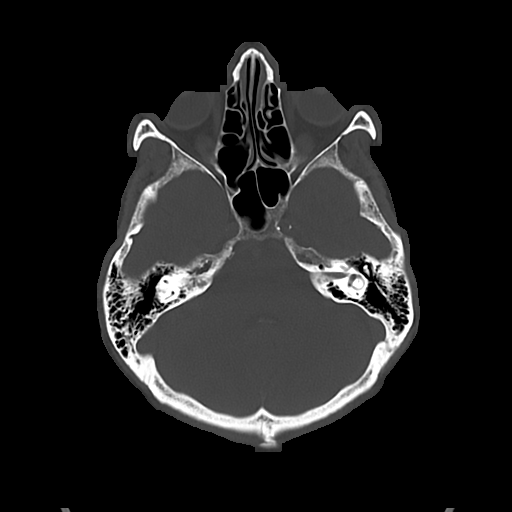
[im 25/81  bone]
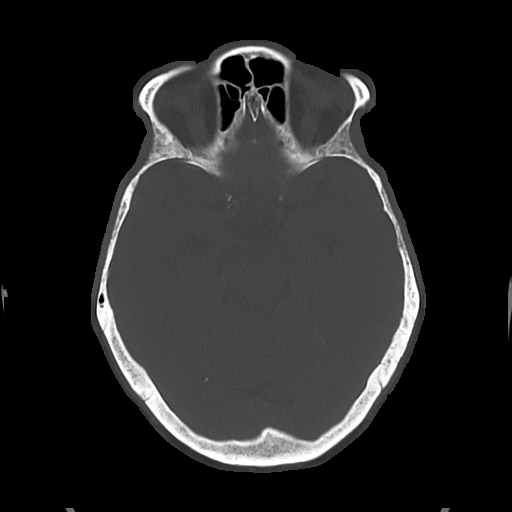
[im 37/81  bone]
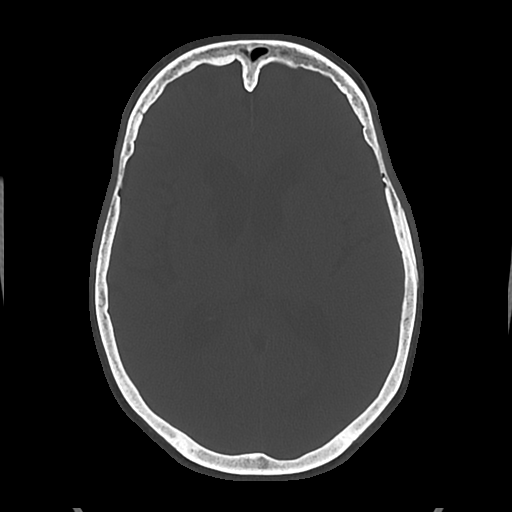

[Series 5: cor soft · coronal · 0.33mm/px · 3 of 69 slices shown]
[im 23/69  brain]
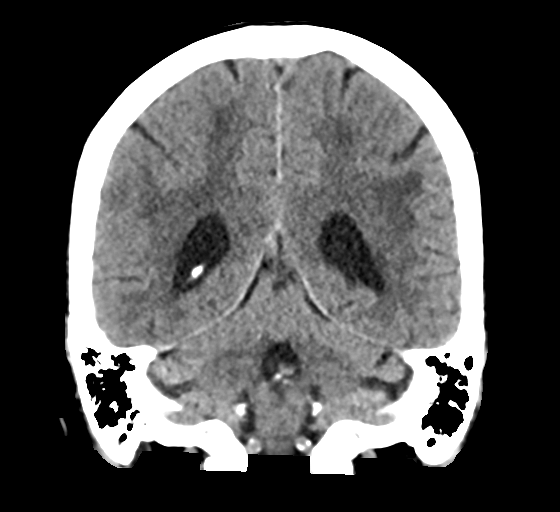
[im 31/69  brain]
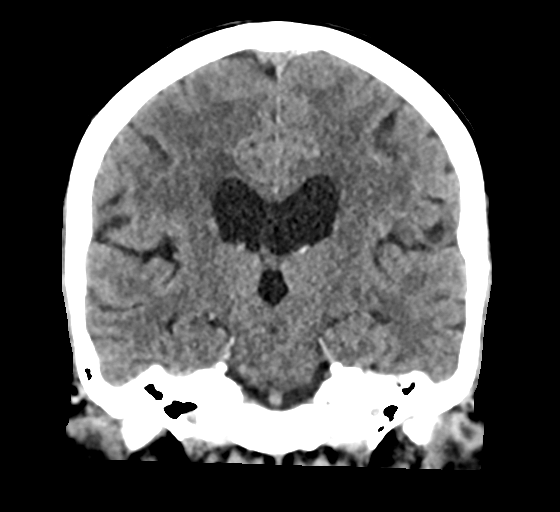
[im 38/69  brain]
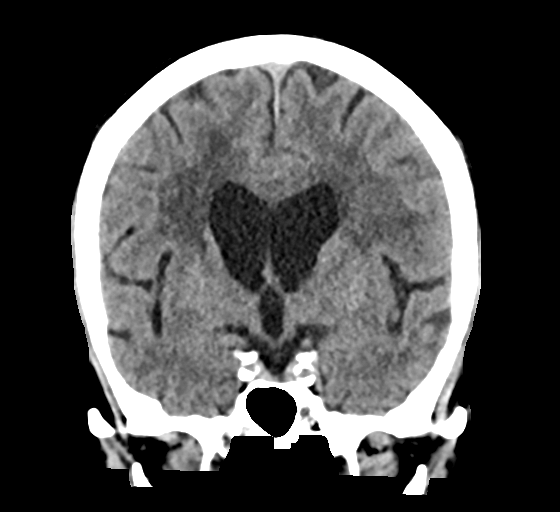

[Series 6: sag soft · sagittal · 0.34mm/px · 3 of 60 slices shown]
[im 20/60  brain]
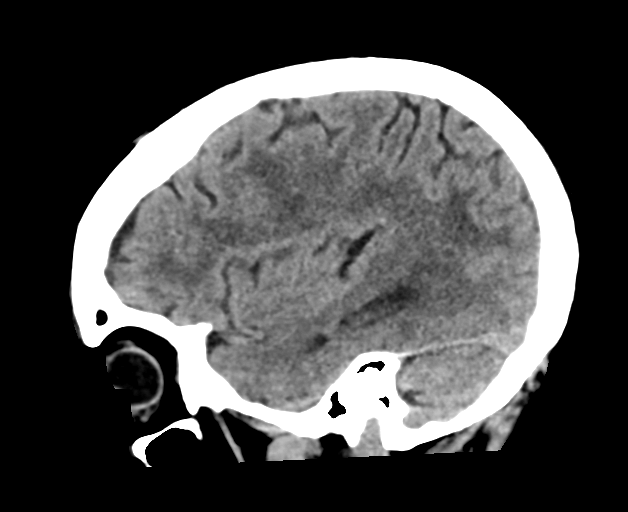
[im 30/60  brain]
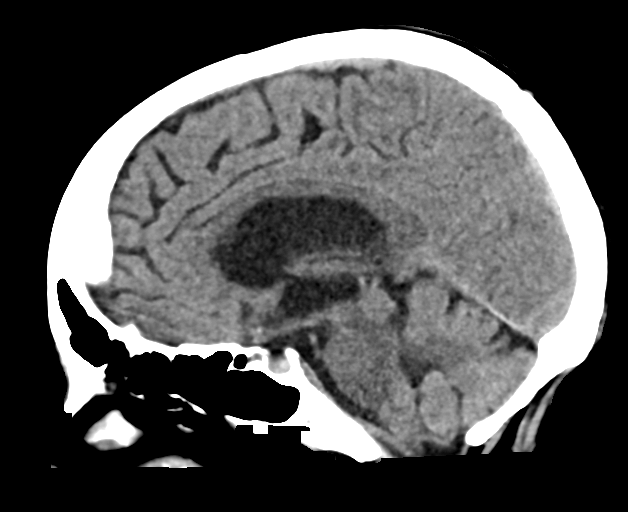
[im 40/60  brain]
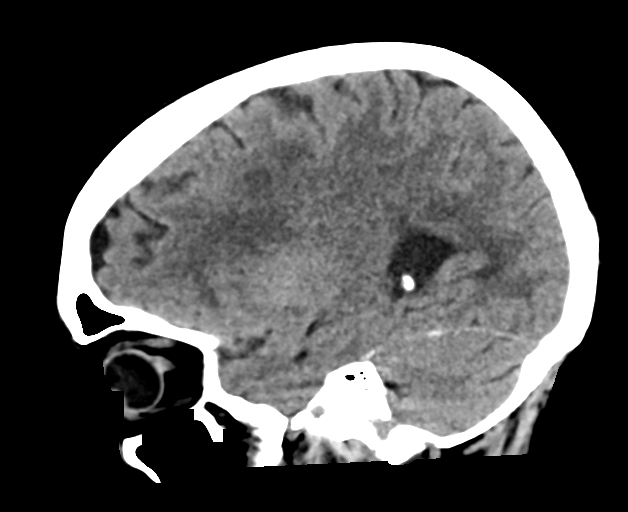

[17 of 47 positions shown; findings below may reference images not displayed]

FINDINGS: Brain: No evidence of acute territorial infarction, hemorrhage,
hydrocephalus,extra-axial collection or mass lesion/mass effect.
There is dilatation the ventricles and sulci consistent with
age-related atrophy. Low-attenuation changes in the deep white
matter consistent with small vessel ischemia.

Vascular: No hyperdense vessel or unexpected calcification.

Skull: The skull is intact. No fracture or focal lesion identified.

Sinuses/Orbits: The visualized paranasal sinuses and mastoid air
cells are clear. The orbits and globes intact.

Other: None
IMPRESSION: No acute intracranial abnormality.

Findings consistent with age related atrophy and chronic small
vessel ischemia

## 2020-08-13 IMAGING — DX DG CHEST 1V PORT
1 series · 1 of 1 positions shown · non-contrast
Comparison: [DATE].

CLINICAL DATA: Syncope.

EXAM:
PORTABLE CHEST 1 VIEW

[chest ap]
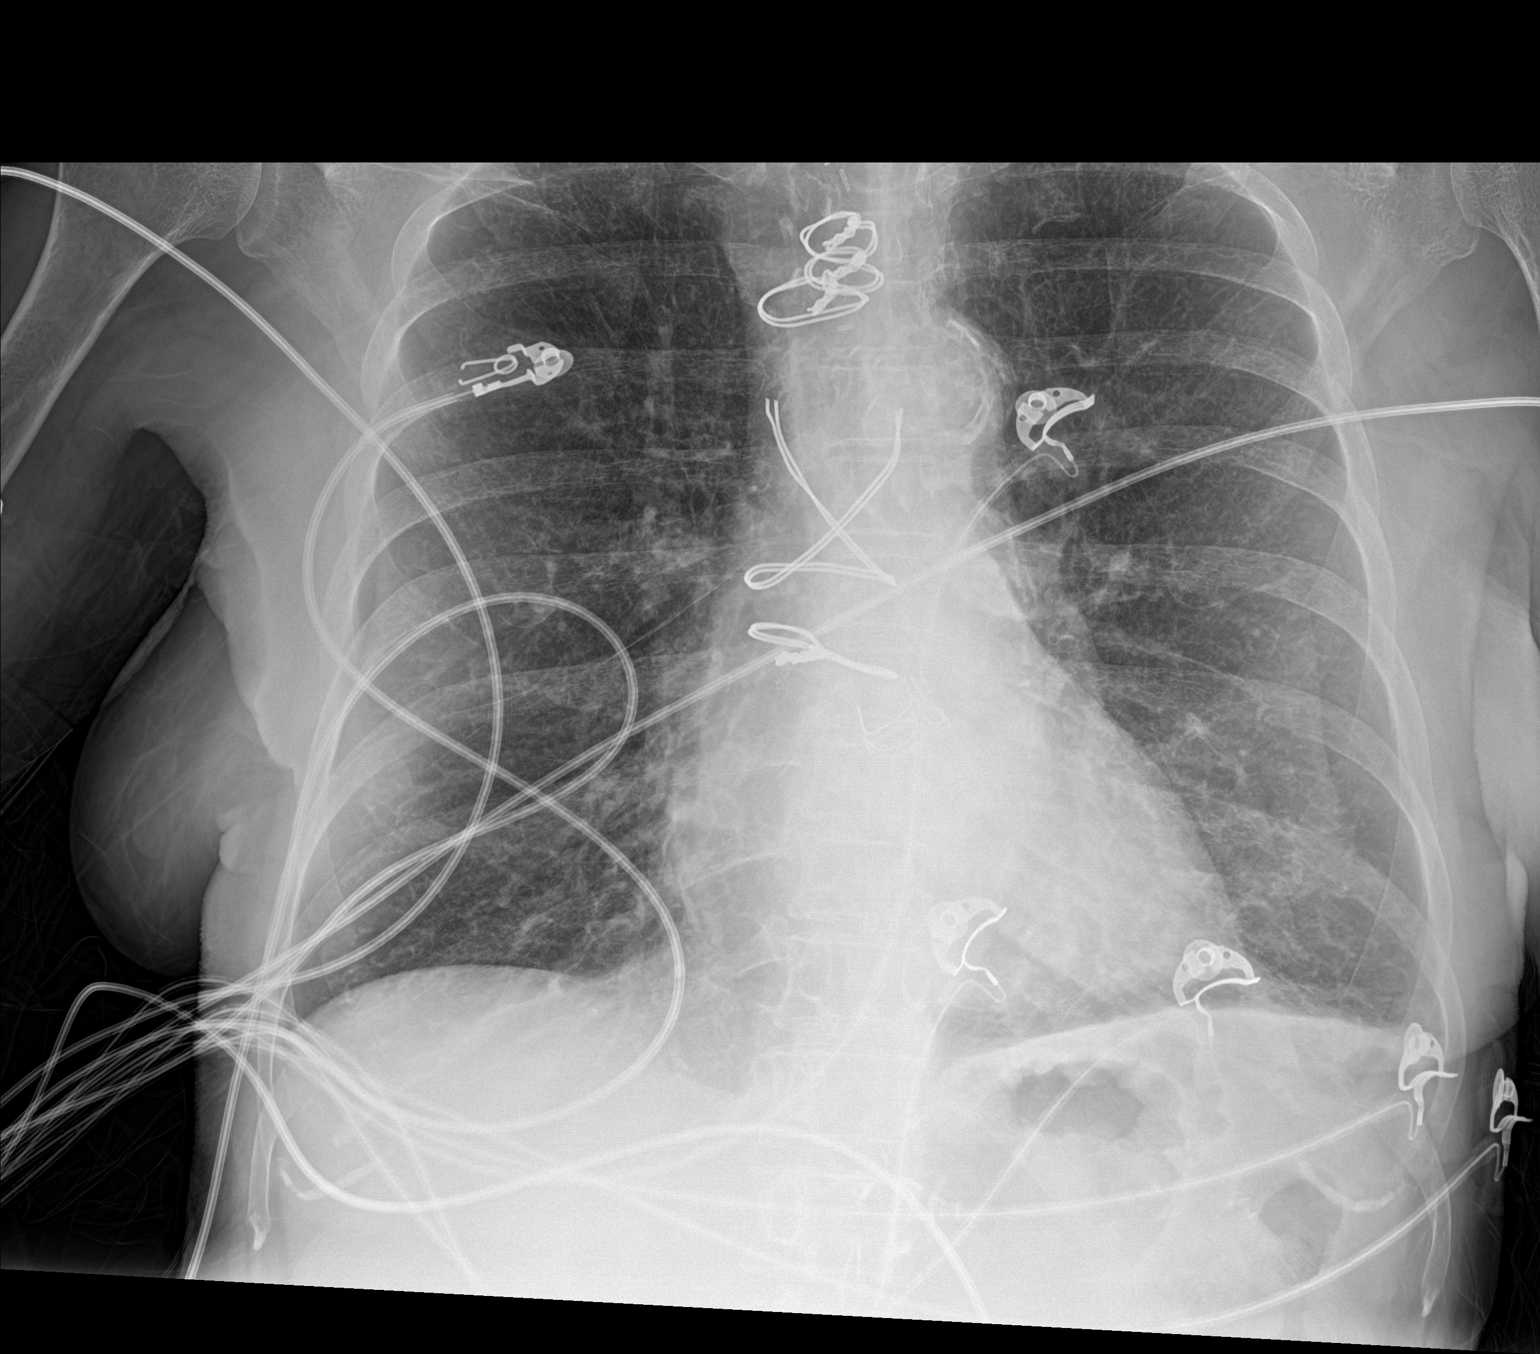

[1 of 1 positions shown; findings below may reference images not displayed]

FINDINGS: The heart size and mediastinal contours are within normal limits.
Both lungs are clear. No pneumothorax or pleural effusion is noted.
The visualized skeletal structures are unremarkable.
IMPRESSION: No active disease.

Aortic Atherosclerosis ([FL]-[FL]).

## 2020-08-13 IMAGING — MR MR HEAD W/O CM
6 of 12 series · 24 of 48 positions shown · non-contrast
Comparison: Prior CT from earlier the same day.

CLINICAL DATA: Initial evaluation for neuro deficit, stroke
suspected.

EXAM:
MRI HEAD WITHOUT CONTRAST
TECHNIQUE: Multiplanar, multiecho pulse sequences of the brain and surrounding
structures were obtained without intravenous contrast.

[Series 2: DWI · axial · 3.0mm · 0.94mm/px · z∈[-128,+16]mm · 7 of 100 slices shown (1 of 2)]
[im 1/100]
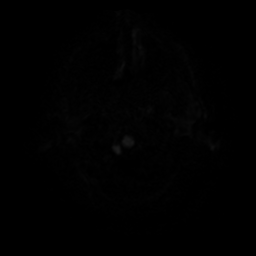
[im 17/100]
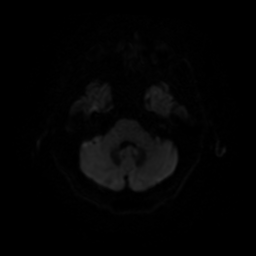
[im 34/100]
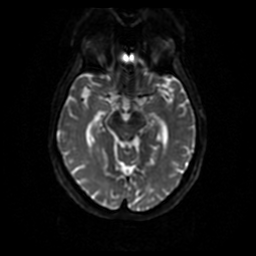
[im 50/100]
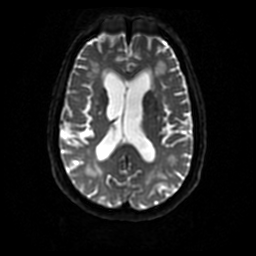
[im 67/100]
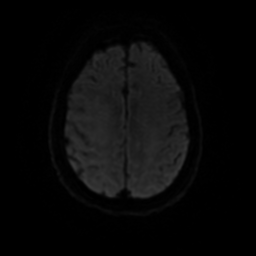
[im 83/100]
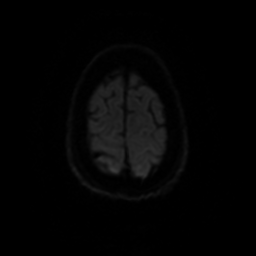
[im 100/100]
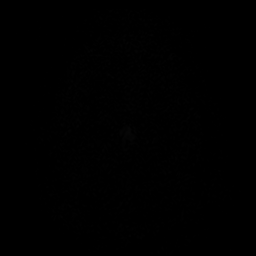

[Series 6: DWI · coronal · 4.0mm · 0.94mm/px · 6 of 76 slices shown (2 of 2)]
[im 1/76]
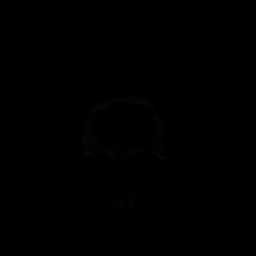
[im 16/76]
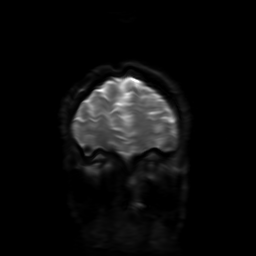
[im 31/76]
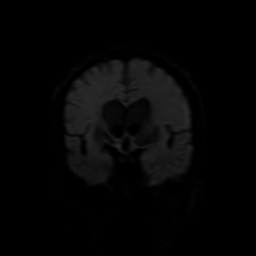
[im 46/76]
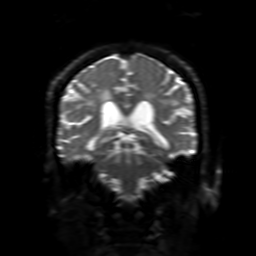
[im 61/76]
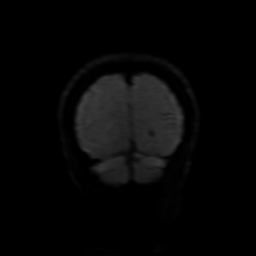
[im 76/76]
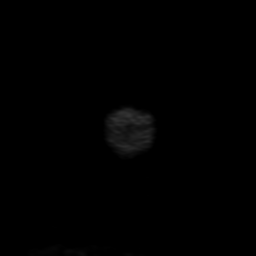

[Series 7: FLAIR · sagittal · 5.0mm · 0.23mm/px · 2 of 26 slices shown (1 of 2)]
[im 1/26]
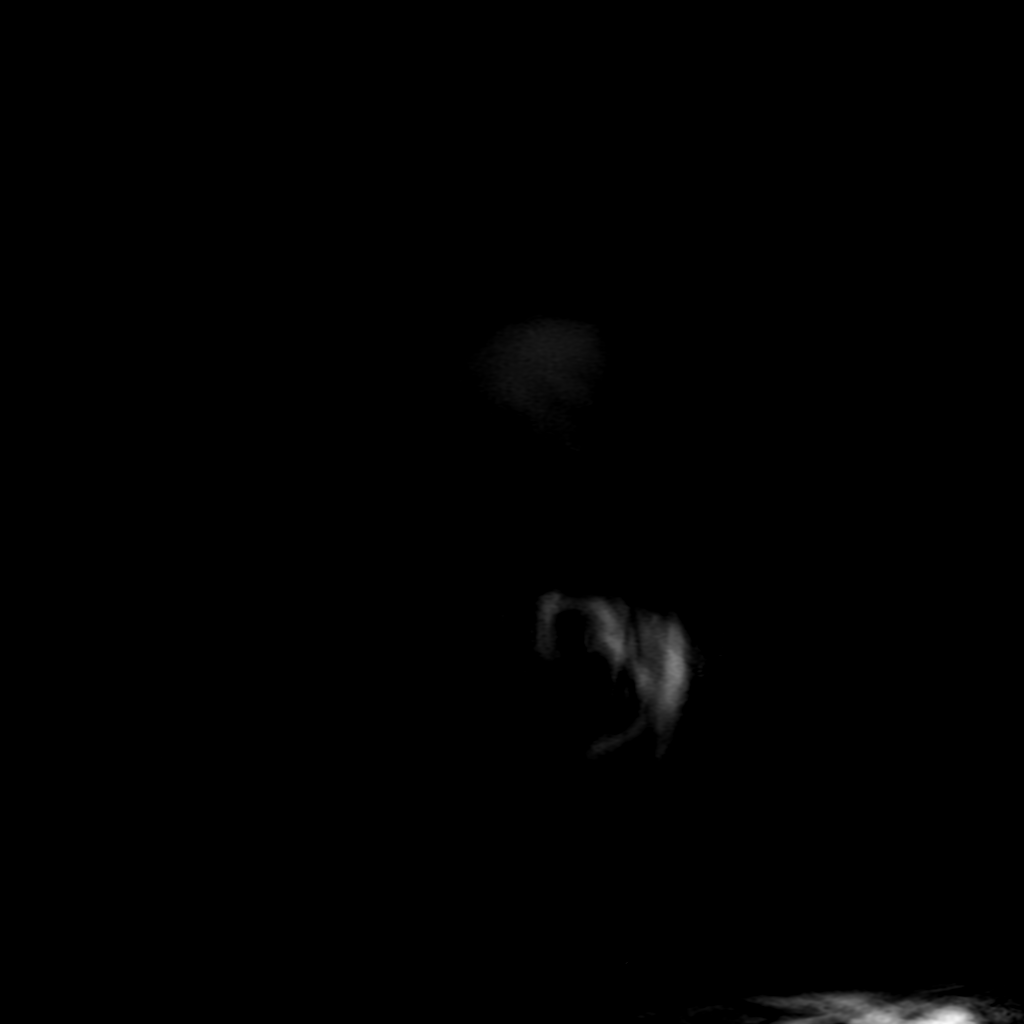
[im 26/26]
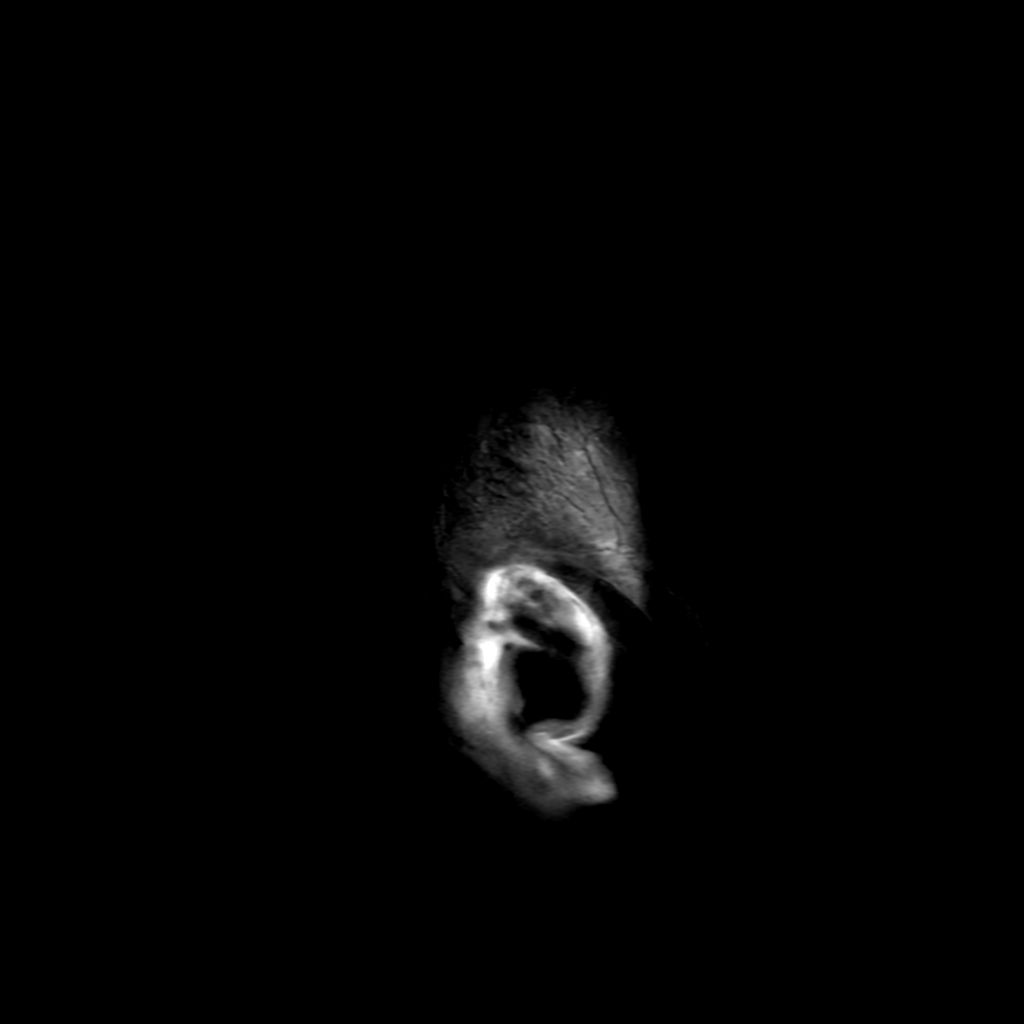

[Series 8: FLAIR · axial · 3.0mm · 0.45mm/px · z∈[-128,+18]mm · 2 of 26 slices shown (2 of 2)]
[im 1/26]
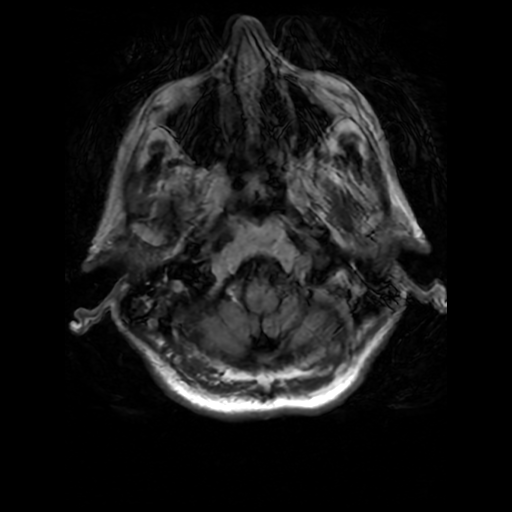
[im 26/26]
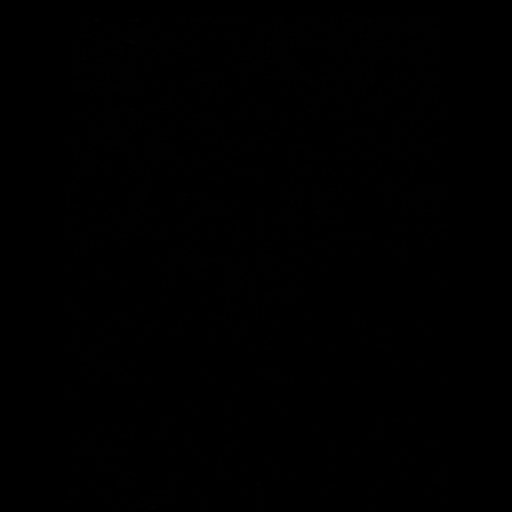

[Series 250: ADC · axial · 3.0mm · 0.94mm/px · z∈[-128,+16]mm · 4 of 50 slices shown (1 of 2)]
[im 1/50]
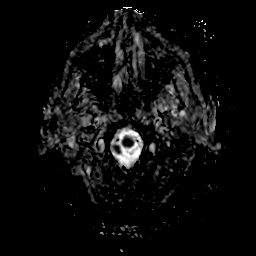
[im 17/50]
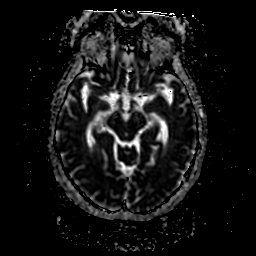
[im 33/50]
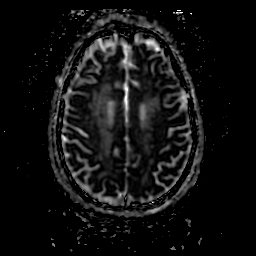
[im 50/50]
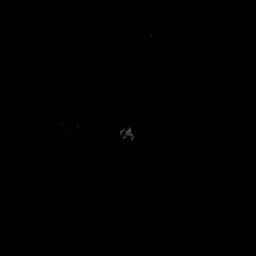

[Series 650: ADC · coronal · 4.0mm · 0.94mm/px · 3 of 37 slices shown (2 of 2)]
[im 1/37]
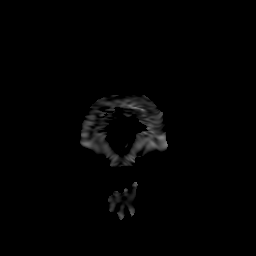
[im 19/37]
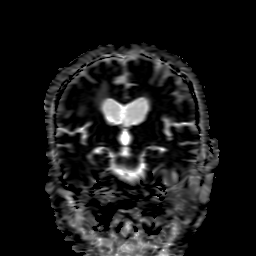
[im 37/37]
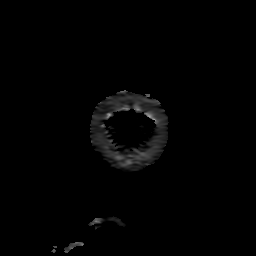

[24 of 48 positions shown; findings below may reference images not displayed]

FINDINGS: Brain: Examination severely limited due to extensive motion
artifact.

Generalized age-related cerebral atrophy. Patchy and confluent
T2/FLAIR hyperintensity within the periventricular and deep white
matter both cerebral hemispheres, most consistent with chronic small
vessel ischemic disease, moderate to advanced in nature. Remote
lacunar infarct present at the right basal ganglia. Probable
additional small remote left cerebellar infarct.

No abnormal foci of restricted diffusion to suggest acute or
subacute ischemia. Gray-white matter differentiation maintained. No
encephalomalacia to suggest chronic cortical infarction. No visible
acute intracranial hemorrhage. Possible small foci of chronic
hemosiderin deposition at the left cerebellum and right temporal
occipital region. No acute hemorrhage seen on prior CT.

No mass lesion, midline shift or mass effect. No hydrocephalus or
extra-axial fluid collection. Pituitary gland suprasellar region
within normal limits. Midline structures intact.

Vascular: Major intracranial vascular flow voids are grossly
preserved at the skull base.

Skull and upper cervical spine: Craniocervical junction within
normal limits. Bone marrow signal intensity normal. No scalp soft
tissue abnormality.

Sinuses/Orbits: Globes and orbital soft tissues grossly within
normal limits. Paranasal sinuses are largely clear. No appreciable
mastoid effusion.

Other: None.
IMPRESSION: 1. Technically limited exam due to extensive motion artifact.
2. No acute intracranial abnormality.
3. Age-related cerebral atrophy with moderate to advanced chronic
microvascular ischemic disease, with remote lacunar infarcts at the
right basal ganglia and left cerebellum.

## 2020-08-13 MED ORDER — ONDANSETRON HCL 4 MG/2ML IJ SOLN
4.0000 mg | Freq: Four times a day (QID) | INTRAMUSCULAR | Status: DC | PRN
  Administered 2020-08-17: 4 mg via INTRAVENOUS
  Filled 2020-08-13: qty 2

## 2020-08-13 MED ORDER — SODIUM CHLORIDE 0.9% FLUSH
3.0000 mL | Freq: Two times a day (BID) | INTRAVENOUS | Status: DC
  Administered 2020-08-13 – 2020-09-01 (×33): 3 mL via INTRAVENOUS

## 2020-08-13 MED ORDER — ACETAMINOPHEN 650 MG RE SUPP: 650.0000 mg | Freq: Four times a day (QID) | RECTAL | Status: DC | PRN

## 2020-08-13 MED ORDER — LACTATED RINGERS IV BOLUS
500.0000 mL | Freq: Once | INTRAVENOUS | Status: AC
  Administered 2020-08-13: 500 mL via INTRAVENOUS

## 2020-08-13 MED ORDER — ENOXAPARIN SODIUM 30 MG/0.3ML ~~LOC~~ SOLN
30.0000 mg | Freq: Every day | SUBCUTANEOUS | Status: DC
  Administered 2020-08-14 – 2020-08-31 (×18): 30 mg via SUBCUTANEOUS
  Filled 2020-08-13 (×19): qty 0.3

## 2020-08-13 MED ORDER — LORAZEPAM 2 MG/ML IJ SOLN
1.0000 mg | INTRAMUSCULAR | Status: AC | PRN
  Administered 2020-08-13 – 2020-08-24 (×2): 1 mg via INTRAVENOUS
  Filled 2020-08-13 (×3): qty 1

## 2020-08-13 MED ORDER — ENOXAPARIN SODIUM 30 MG/0.3ML ~~LOC~~ SOLN: 30.0000 mg | SUBCUTANEOUS | Status: DC

## 2020-08-13 MED ORDER — ONDANSETRON HCL 4 MG PO TABS: 4.0000 mg | ORAL_TABLET | Freq: Four times a day (QID) | ORAL | Status: DC | PRN

## 2020-08-13 MED ORDER — ACETAMINOPHEN 325 MG PO TABS
650.0000 mg | ORAL_TABLET | Freq: Four times a day (QID) | ORAL | Status: DC | PRN
  Administered 2020-08-14 – 2020-08-27 (×7): 650 mg via ORAL
  Filled 2020-08-13 (×7): qty 2

## 2020-08-13 NOTE — ED Notes (Signed)
Patient transported to MRI 

## 2020-08-13 NOTE — ED Notes (Signed)
Patient transported to CT 

## 2020-08-13 NOTE — ED Provider Notes (Signed)
[facility] DEPARTMENT Provider Note   CSN: [id number] Arrival date & time: 08/13/20  1618     History Chief Complaint  Patient presents with  . Near Syncope    Sandra Sheppard is a 75 y.o. female.  HPI Patient has history of aortic valve replacement and atrial fibrillation.  She was out walking her dog this afternoon.  She reports she suddenly started stumbling and careening forward.  She does not recall actually tripping over anything.  She reports she felt kind of lightheaded and off balance.  She ended up crashing into a bunch of bushes.  Neighbors were able to come and assist her.  They were able to get her back up and moved to a stationary position.  Patient denies if she perceived any specific chest pain or racing of her heart.  She however cannot say why she started to careen and fall, she did feel as though she was getting close to passing out.  However, she denies any loss of consciousness.  She denies she struck her head.  She denies chest pain or extremity pain.  Patient denies she has been sick recently.  She reports has been active doing usual activities.  No recent fevers, chills, cough, vomiting or diarrhea.    Past Medical History:  Diagnosis Date  . ABDOMINAL AORTIC ANEURYSM   . Atrial fibrillation ([place])   . CAROTID STENOSIS   . CKD (chronic kidney disease), stage V ([place])   . Complication of anesthesia   . Confusion   . CVD (cardiovascular disease)   . Diabetes ([place])    no meds at present time  . FIBRILLATION, ATRIAL   . HYPERCHOLESTEROLEMIA  IIA   . HYPERTENSION, MILD   . MURMUR   . Osteoporosis   . PERIPHERAL VASCULAR DISEASE   . PONV (postoperative nausea and vomiting)   . STENOSIS, MITRAL AND AORTIC VALVES     Patient Active Problem List   Diagnosis Date Noted  . Tobacco abuse 09/22/2019  . Educated about COVID-19 virus infection 09/22/2019  . Asymptomatic bilateral carotid artery stenosis 09/22/2019  . Acute renal failure  with acute tubular necrosis superimposed on stage 3 chronic kidney disease ([place])   . Palliative care encounter   . Hypocalcemia   . FTT (failure to thrive) in adult   . AKI (acute kidney injury) ([place]) 08/08/2018  . Hyperkalemia 08/08/2018  . Metabolic acidosis, increased anion gap 08/08/2018  . Normocytic anemia 08/08/2018  . Diabetes mellitus type II, non insulin dependent ([place]) 08/08/2018  . Bilateral carotid artery disease ([place]) 07/12/2018  . Dyslipidemia 07/12/2018  . Vomiting 07/04/2016  . Gastroenteritis 07/04/2016  . CKD (chronic kidney disease) stage 3, GFR 30-59 ml/min ([facility]) 07/04/2016  . Occlusion and stenosis of carotid artery 04/28/2010  . ABDOMINAL AORTIC ANEURYSM 04/28/2010  . Peripheral vascular disease ([place]) 12/08/2008  . STENOSIS, MITRAL AND AORTIC VALVES 10/07/2008  . FIBRILLATION, ATRIAL 10/07/2008  . H/O aortic valve replacement 08/14/2008  . HYPERCHOLESTEROLEMIA  IIA 06/23/2008  . HYPERTENSION, MILD 06/23/2008  . MURMUR 06/23/2008    Past Surgical History:  Procedure Laterality Date  . AORTIC VALVE REPLACEMENT     08/14/2008.  Dr. Roxy Manns  . MYRINGOTOMY WITH TUBE PLACEMENT Bilateral 03/01/2019   Procedure: MYRINGOTOMY WITH  T TUBE PLACEMENT;  Surgeon: Leta Baptist, MD;  Location: [facility];  Service: ENT;  Laterality: Bilateral;  . TOTAL ABDOMINAL HYSTERECTOMY       OB History   No obstetric history  on file.     Family History  Problem Relation Age of Onset  . Colon polyps Other   . Heart disease Mother   . Heart disease Father   . Heart disease Brother     Social History   Tobacco Use  . Smoking status: Former Smoker    Packs/day: 0.25  . Smokeless tobacco: Never Used  Substance Use Topics  . Alcohol use: No  . Drug use: Never    Home Medications Prior to Admission medications   Medication Sig Start Date End Date Taking? Authorizing Provider  amLODipine (NORVASC) 10 MG tablet Take 1 tablet (10 mg total) by mouth daily. 09/01/18    Hongalgi, Lenis Dickinson, MD  aspirin 81 MG tablet Take 81 mg by mouth daily.    [provider]  Cholecalciferol (VITAMIN D-3) 1000 UNITS CAPS Take 2,000 Units by mouth daily.     [provider]  docusate sodium (COLACE) 100 MG capsule Take 1 capsule (100 mg total) by mouth 2 (two) times daily. 08/31/18   Hongalgi, Lenis Dickinson, MD  loratadine (CLARITIN) 10 MG tablet Take 10 mg by mouth daily.    [provider]  Multiple Vitamin (MULTIVITAMIN) capsule Take 1 capsule by mouth daily.    [provider]  pravastatin (PRAVACHOL) 10 MG tablet Take 1 tablet (10 mg total) by mouth daily. 09/01/18   Hongalgi, Lenis Dickinson, MD  vitamin B-12 1000 MCG tablet Take 1 tablet (1,000 mcg total) by mouth daily. 08/31/18   Hongalgi, Lenis Dickinson, MD  zoledronic acid (RECLAST) 5 MG/100ML SOLN injection Inject 5 mg into the vein See admin instructions. Once a year    [provider]    Allergies    Codeine, Sulfonamide derivatives, and Penicillins  Review of Systems   Review of Systems 10 systems reviewed and negative except as per HPI Physical Exam Updated Vital Signs BP (!) 156/72   Pulse 69   Temp 98.5 F (36.9 C) (Oral)   Resp 18   Ht 5' 5.5" (1.664 m)   Wt 52.6 kg   SpO2 97%   BMI 19.01 kg/m   Physical Exam Constitutional:      Comments: Patient is alert and nontoxic.  No respiratory distress.  Well-nourished well-developed  HENT:     Head: Normocephalic and atraumatic.     Mouth/Throat:     Mouth: Mucous membranes are moist.     Pharynx: Oropharynx is clear.  Eyes:     Extraocular Movements: Extraocular movements intact.     Pupils: Pupils are equal, round, and reactive to light.  Neck:     Comments: No C-spine tender Cardiovascular:     Rate and Rhythm: Normal rate. Rhythm irregular.     Pulses: Normal pulses.     Heart sounds: Normal heart sounds.  Pulmonary:     Effort: Pulmonary effort is normal.     Breath sounds: Normal breath sounds.  Chest:      Chest wall: No tenderness.  Abdominal:     General: There is no distension.     Palpations: Abdomen is soft.     Tenderness: There is no abdominal tenderness.  Musculoskeletal:        General: No swelling or tenderness. Normal range of motion.     Cervical back: Neck supple.     Right lower leg: No edema.     Left lower leg: No edema.  Skin:    General: Skin is warm and dry.  Neurological:  Comments: No focal deficits.  Patient is alert and answering questions appropriately.  Grip strength symmetric bilaterally.  Patient can flex and extend both lower extremities against resistance.  No deficit to light touch.     ED Results / Procedures / Treatments   Labs (all labs ordered are listed, but only abnormal results are displayed) Labs Reviewed  COMPREHENSIVE METABOLIC PANEL - Abnormal; Notable for the following components:      Result Value   Sodium 133 (*)    CO2 17 (*)    Glucose, Bld 183 (*)    Creatinine, Ser 2.54 (*)    Calcium 8.6 (*)    Albumin 3.4 (*)    AST 14 (*)    GFR, Estimated 19 (*)    All other components within normal limits  CBC WITH DIFFERENTIAL/PLATELET - Abnormal; Notable for the following components:   Hemoglobin 11.9 (*)    All other components within normal limits  CBG MONITORING, ED - Abnormal; Notable for the following components:   Glucose-Capillary 172 (*)    All other components within normal limits  URINALYSIS, ROUTINE W REFLEX MICROSCOPIC  ETHANOL  PROTIME-INR  TROPONIN I (HIGH SENSITIVITY)  TROPONIN I (HIGH SENSITIVITY)    EKG EKG Interpretation  Date/Time:  Thursday August 13 2020 16:20:07 EST Ventricular Rate:  73 PR Interval:    QRS Duration: 90 QT Interval:  408 QTC Calculation: 450 R Axis:   35 Text Interpretation: Sinus rhythm Abnrm T, consider ischemia, anterolateral lds similar lateral changes seen on older tracings Confirmed by Charlesetta Shanks [phone]) on 08/13/2020 6:30:42 PM   Radiology CT Head Wo Contrast  Result  Date: 08/13/2020 CLINICAL DATA:  Mental status change EXAM: CT HEAD WITHOUT CONTRAST TECHNIQUE: Contiguous axial images were obtained from the base of the skull through the vertex without intravenous contrast. COMPARISON:  October 09, 2019 FINDINGS: Brain: No evidence of acute territorial infarction, hemorrhage, hydrocephalus,extra-axial collection or mass lesion/mass effect. There is dilatation the ventricles and sulci consistent with age-related atrophy. Low-attenuation changes in the deep white matter consistent with small vessel ischemia. Vascular: No hyperdense vessel or unexpected calcification. Skull: The skull is intact. No fracture or focal lesion identified. Sinuses/Orbits: The visualized paranasal sinuses and mastoid air cells are clear. The orbits and globes intact. Other: None IMPRESSION: No acute intracranial abnormality. Findings consistent with age related atrophy and chronic small vessel ischemia Electronically Signed   By: Prudencio Pair M.D.   On: 08/13/2020 18:23   MR BRAIN WO CONTRAST  Result Date: 08/13/2020 CLINICAL DATA:  Initial evaluation for neuro deficit, stroke suspected. EXAM: MRI HEAD WITHOUT CONTRAST TECHNIQUE: Multiplanar, multiecho pulse sequences of the brain and surrounding structures were obtained without intravenous contrast. COMPARISON:  Prior CT from earlier the same day. FINDINGS: Brain: Examination severely limited due to extensive motion artifact. Generalized age-related cerebral atrophy. Patchy and confluent T2/FLAIR hyperintensity within the periventricular and deep white matter both cerebral hemispheres, most consistent with chronic small vessel ischemic disease, moderate to advanced in nature. Remote lacunar infarct present at the right basal ganglia. Probable additional small remote left cerebellar infarct. No abnormal foci of restricted diffusion to suggest acute or subacute ischemia. Gray-white matter differentiation maintained. No encephalomalacia to suggest chronic  cortical infarction. No visible acute intracranial hemorrhage. Possible small foci of chronic hemosiderin deposition at the left cerebellum and right temporal occipital region. No acute hemorrhage seen on prior CT. No mass lesion, midline shift or mass effect. No hydrocephalus or extra-axial fluid collection. Pituitary gland suprasellar region  within normal limits. Midline structures intact. Vascular: Major intracranial vascular flow voids are grossly preserved at the skull base. Skull and upper cervical spine: Craniocervical junction within normal limits. Bone marrow signal intensity normal. No scalp soft tissue abnormality. Sinuses/Orbits: Globes and orbital soft tissues grossly within normal limits. Paranasal sinuses are largely clear. No appreciable mastoid effusion. Other: None. IMPRESSION: 1. Technically limited exam due to extensive motion artifact. 2. No acute intracranial abnormality. 3. Age-related cerebral atrophy with moderate to advanced chronic microvascular ischemic disease, with remote lacunar infarcts at the right basal ganglia and left cerebellum. Electronically Signed   By: Jeannine Boga M.D.   On: 08/13/2020 22:45   DG Chest Port 1 View  Result Date: 08/13/2020 CLINICAL DATA:  Syncope. EXAM: PORTABLE CHEST 1 VIEW COMPARISON:  July 04, 2016. FINDINGS: The heart size and mediastinal contours are within normal limits. Both lungs are clear. No pneumothorax or pleural effusion is noted. The visualized skeletal structures are unremarkable. IMPRESSION: No active disease. Aortic Atherosclerosis (ICD10-I70.0). Electronically Signed   By: Marijo Conception M.D.   On: 08/13/2020 16:51    Procedures Procedures   Medications Ordered in ED Medications  LORazepam (ATIVAN) injection 1 mg (1 mg Intravenous Given 08/13/20 2130)  lactated ringers bolus 500 mL (0 mLs Intravenous Stopped 08/13/20 2101)    ED Course  I have reviewed the triage vital signs and the nursing notes.  Pertinent labs &  imaging results that were available during my care of the patient were reviewed by me and considered in my medical decision making (see chart for details).  Clinical Course as of 08/13/20 2326  Thu Aug 13, 2020  2324 Patient has developed interim delirium.  She is picking at monitor leads and slightly agitated and confused. [MP]    Clinical Course User Index [MP] Charlesetta Shanks, MD   MDM Rules/Calculators/A&P                          Patient had a syncopal near syncopal event.  She could not identify any particular mechanical cause for loss of balance and collapse.  Patient evaluated for cardiac and neurologic etiology.  Troponins are stable.  Patient does not endorse chest pain.  Does have history of atrial fibrillation.  Dysrhythmia is a possibility.  Consideration given to acute stroke.  No acute findings on MRI.  Patient has intact neurologic exam without deficit.  After MRI, patient has developed delirium.  This may be medication induced as she required Ativan to tolerate the MRI.  She stated she has significant claustrophobia.  Patient's results and presentation reviewed with the patient's sister.  She confirms that the patient does live alone.  She does not suffer typically from dementia but is concerned for her safety of living independently.  She will need observation for resolution of delirium and likely PT OT consult to determine safety for discharge back to independent living at home. Final Clinical Impression(s) / ED Diagnoses Final diagnoses:  Syncope and collapse  Delirium    Rx / DC Orders ED Discharge Orders    None       Charlesetta Shanks, MD 08/13/20 2330

## 2020-08-13 NOTE — H&P (Signed)
History and Physical    Sandra Sheppard [id number] DOB: 12-18-1945 DOA: 08/13/2020  PCP: Prince Solian, MD  Patient coming from: Home.  I have personally briefly reviewed patient's old medical records in [place]  Chief Complaint: Syncopal episode.  HPI: Sandra Sheppard is a 75 y.o. female with medical history significant of AAA, paroxysmal atrial fibrillation, carotid stenosis, aortic and mitral valve stenosis, stage IV CKD, type 2 diabetes, hyperlipidemia, hypertension, osteoporosis, peripheral vascular disease who is brought to the emergency department after getting lightheaded, off balance and nearly passing out while walking her dog around her neighborhood.  She ended up crashing into some bushes.  [place] neighbors came and assisted her and called EMS.  She denies chest pain, dyspnea, palpitations, diaphoresis, PND, orthopnea or recent pitting edema lower extremities.  No fever, chills, sore throat, rhinorrhea, wheezing or hemoptysis.  Abdominal pain, nausea, emesis, diarrhea, constipation, melena or hematochezia.  Denies dysuria, frequency or hematuria.  Denies polyuria, polydipsia, polyphagia or blurred vision.  ED Course: Initial vital signs were temperature 98.5 F, pulse 71, respiration 18, BP 151/63 mmHg O2 sat 93% on room air.  The patient received 500 mL of LR bolus and 1 mg of lorazepam IVP prior to MRI imaging.  Lab work: Urinalysis was normal.  CBC showed a white count of 6.3, hemoglobin 11.9 g/dL and platelets 174.  PT was 13.4 and INR 1.1 alcohol level was normal.  Troponin was 9 and then 10 ng/L.  CMP shows a sodium of 133 and CO2 of 17 mmol/L.  Anion gap was 11.  All other electrolytes are within normal range.  Glucose under 83, BUN 23 and creatinine 2.54 mg/dL.  Albumin was 3.4 g/dL, the rest of the hepatic functions are unremarkable.  Imaging: 1 view chest radiograph did not show any active disease.  There was aortic atherosclerosis.  CT head without contrast did  not show any acute intracranial normality.  MRI brain without contrast was limited due to motion artifact, but no acute intracranial abnormality was seen.  There was age-related cerebral atrophy with moderate to advanced chronic muscular vascular ischemic disease.  There are remote lacunar infarcts of the right basal ganglia and left cerebellum.  Please see images and full radiology report for further details.  Review of Systems: As per HPI otherwise all other systems reviewed and are negative.  Past Medical History:  Diagnosis Date  . ABDOMINAL AORTIC ANEURYSM   . Atrial fibrillation ([place])   . CAROTID STENOSIS   . CKD (chronic kidney disease), stage V ([place])   . Complication of anesthesia   . Confusion   . CVD (cardiovascular disease)   . Diabetes ([place])    no meds at present time  . FIBRILLATION, ATRIAL   . HYPERCHOLESTEROLEMIA  IIA   . HYPERTENSION, MILD   . MURMUR   . Osteoporosis   . PERIPHERAL VASCULAR DISEASE   . PONV (postoperative nausea and vomiting)   . STENOSIS, MITRAL AND AORTIC VALVES    Past Surgical History:  Procedure Laterality Date  . AORTIC VALVE REPLACEMENT     08/14/2008.  Dr. Roxy Manns  . MYRINGOTOMY WITH TUBE PLACEMENT Bilateral 03/01/2019   Procedure: MYRINGOTOMY WITH  T TUBE PLACEMENT;  Surgeon: Leta Baptist, MD;  Location: [facility];  Service: ENT;  Laterality: Bilateral;  . TOTAL ABDOMINAL HYSTERECTOMY     Social History  reports that she has quit smoking. She smoked 0.25 packs per day. She has never used smokeless tobacco. She reports  that she does not drink alcohol and does not use drugs.  Allergies  Allergen Reactions  . Codeine     Rash, Nausea and Vomiting   . Sulfonamide Derivatives   . Penicillins Rash    Has patient had a PCN reaction causing immediate rash, facial/tongue/throat swelling, SOB or lightheadedness with hypotension: YES Has patient had a PCN reaction causing severe rash involving mucus membranes or skin necrosis:NO Has  patient had a PCN reaction that required hospitalization NO Has patient had a PCN reaction occurring within the last 10 years: NO If all of the above answers are "NO", then may proceed with Cephalosporin use.   Family History  Problem Relation Age of Onset  . Colon polyps Other   . Heart disease Mother   . Heart disease Father   . Heart disease Brother    Prior to Admission medications   Medication Sig Start Date End Date Taking? Authorizing Provider  amLODipine (NORVASC) 10 MG tablet Take 1 tablet (10 mg total) by mouth daily. 09/01/18   Hongalgi, Lenis Dickinson, MD  aspirin 81 MG tablet Take 81 mg by mouth daily.    [provider]  Cholecalciferol (VITAMIN D-3) 1000 UNITS CAPS Take 2,000 Units by mouth daily.     [provider]  docusate sodium (COLACE) 100 MG capsule Take 1 capsule (100 mg total) by mouth 2 (two) times daily. 08/31/18   Hongalgi, Lenis Dickinson, MD  loratadine (CLARITIN) 10 MG tablet Take 10 mg by mouth daily.    [provider]  Multiple Vitamin (MULTIVITAMIN) capsule Take 1 capsule by mouth daily.    [provider]  pravastatin (PRAVACHOL) 10 MG tablet Take 1 tablet (10 mg total) by mouth daily. 09/01/18   Hongalgi, Lenis Dickinson, MD  vitamin B-12 1000 MCG tablet Take 1 tablet (1,000 mcg total) by mouth daily. 08/31/18   Hongalgi, Lenis Dickinson, MD  zoledronic acid (RECLAST) 5 MG/100ML SOLN injection Inject 5 mg into the vein See admin instructions. Once a year    [provider]   Physical Exam: Vitals:   08/13/20 2030 08/13/20 2100 08/13/20 2130 08/13/20 2300  BP: (!) 158/64 (!) 154/79 (!) 149/60 (!) 156/72  Pulse: (!) 59 68 67 69  Resp: '17 18 20 18  '$ Temp:      TempSrc:      SpO2: 92% 94% 94% 97%  Weight:      Height:       Constitutional: NAD, calm, comfortable Eyes: PERRL, lids and conjunctivae mildly injected. ENMT: Mucous membranes are moist. Posterior pharynx clear of any exudate or lesions. Neck: normal, supple, no masses, no  thyromegaly Respiratory: clear to auscultation bilaterally, no wheezing, no crackles. Normal respiratory effort. No accessory muscle use.  Cardiovascular: Regular rate and rhythm, no murmurs / rubs / gallops. No extremity edema. 2+ pedal pulses. No carotid bruits.  Abdomen: No distention.  Bowel sounds positive.  Soft, no tenderness, no masses palpated. No hepatosplenomegaly. Musculoskeletal: Mild generalized weakness.  No clubbing / cyanosis.Good ROM, no contractures. Normal muscle tone.  Skin: no rashes, lesions, ulcers on very limited dermatological examination. Neurologic: CN 2-12 grossly intact. Sensation intact, DTR normal. Strength 5/5 in all 4.  Psychiatric: Alert and oriented x 2, partially oriented to time and situation.  Patient became mildly confused after receiving lorazepam 1 mg IVP prior to MRI scan.  Labs on Admission: I have personally reviewed following labs and imaging studies  CBC: Recent Labs  Lab 08/13/20 1640  WBC 6.3  NEUTROABS 4.2  HGB 11.9*  HCT 36.8  MCV 89.3  PLT [id number]    Basic Metabolic Panel: Recent Labs  Lab 08/13/20 1640  NA 133*  K 4.0  CL 105  CO2 17*  GLUCOSE 183*  BUN 23  CREATININE 2.54*  CALCIUM 8.6*    GFR: Estimated Creatinine Clearance: 15.9 mL/min (A) (by C-G formula based on SCr of 2.54 mg/dL (H)).  Liver Function Tests: Recent Labs  Lab 08/13/20 1640  AST 14*  ALT 8  ALKPHOS 84  BILITOT 0.6  PROT 6.5  ALBUMIN 3.4*    Urine analysis:    Component Value Date/Time   COLORURINE YELLOW 08/13/2020 1722   APPEARANCEUR CLEAR 08/13/2020 1722   LABSPEC 1.009 08/13/2020 1722   PHURINE 5.0 08/13/2020 1722   GLUCOSEU NEGATIVE 08/13/2020 1722   HGBUR NEGATIVE 08/13/2020 1722   BILIRUBINUR NEGATIVE 08/13/2020 1722   KETONESUR NEGATIVE 08/13/2020 1722   PROTEINUR NEGATIVE 08/13/2020 1722        NITRITE NEGATIVE 08/13/2020 1722   LEUKOCYTESUR NEGATIVE 08/13/2020 1722    Radiological Exams on Admission: CT Head Wo  Contrast  Result Date: 08/13/2020 CLINICAL DATA:  Mental status change EXAM: CT HEAD WITHOUT CONTRAST TECHNIQUE: Contiguous axial images were obtained from the base of the skull through the vertex without intravenous contrast. COMPARISON:  October 09, 2019 FINDINGS: Brain: No evidence of acute territorial infarction, hemorrhage, hydrocephalus,extra-axial collection or mass lesion/mass effect. There is dilatation the ventricles and sulci consistent with age-related atrophy. Low-attenuation changes in the deep white matter consistent with small vessel ischemia. Vascular: No hyperdense vessel or unexpected calcification. Skull: The skull is intact. No fracture or focal lesion identified. Sinuses/Orbits: The visualized paranasal sinuses and mastoid air cells are clear. The orbits and globes intact. Other: None IMPRESSION: No acute intracranial abnormality. Findings consistent with age related atrophy and chronic small vessel ischemia Electronically Signed   By: Prudencio Pair M.D.   On: 08/13/2020 18:23   MR BRAIN WO CONTRAST  Result Date: 08/13/2020 CLINICAL DATA:  Initial evaluation for neuro deficit, stroke suspected. EXAM: MRI HEAD WITHOUT CONTRAST TECHNIQUE: Multiplanar, multiecho pulse sequences of the brain and surrounding structures were obtained without intravenous contrast. COMPARISON:  Prior CT from earlier the same day. FINDINGS: Brain: Examination severely limited due to extensive motion artifact. Generalized age-related cerebral atrophy. Patchy and confluent T2/FLAIR hyperintensity within the periventricular and deep white matter both cerebral hemispheres, most consistent with chronic small vessel ischemic disease, moderate to advanced in nature. Remote lacunar infarct present at the right basal ganglia. Probable additional small remote left cerebellar infarct. No abnormal foci of restricted diffusion to suggest acute or subacute ischemia. Gray-white matter differentiation maintained. No encephalomalacia  to suggest chronic cortical infarction. No visible acute intracranial hemorrhage. Possible small foci of chronic hemosiderin deposition at the left cerebellum and right temporal occipital region. No acute hemorrhage seen on prior CT. No mass lesion, midline shift or mass effect. No hydrocephalus or extra-axial fluid collection. Pituitary gland suprasellar region within normal limits. Midline structures intact. Vascular: Major intracranial vascular flow voids are grossly preserved at the skull base. Skull and upper cervical spine: Craniocervical junction within normal limits. Bone marrow signal intensity normal. No scalp soft tissue abnormality. Sinuses/Orbits: Globes and orbital soft tissues grossly within normal limits. Paranasal sinuses are largely clear. No appreciable mastoid effusion. Other: None. IMPRESSION: 1. Technically limited exam due to extensive motion artifact. 2. No acute intracranial abnormality. 3. Age-related cerebral atrophy with moderate to advanced chronic microvascular ischemic disease, with  remote lacunar infarcts at the right basal ganglia and left cerebellum. Electronically Signed   By: Jeannine Boga M.D.   On: 08/13/2020 22:45   DG Chest Port 1 View  Result Date: 08/13/2020 CLINICAL DATA:  Syncope. EXAM: PORTABLE CHEST 1 VIEW COMPARISON:  July 04, 2016. FINDINGS: The heart size and mediastinal contours are within normal limits. Both lungs are clear. No pneumothorax or pleural effusion is noted. The visualized skeletal structures are unremarkable. IMPRESSION: No active disease. Aortic Atherosclerosis (ICD10-I70.0). Electronically Signed   By: Marijo Conception M.D.   On: 08/13/2020 16:51   05/26/2017 echocardiogram -------------------------------------------------------------------  LV EF: 60% -  65%   -------------------------------------------------------------------  Indications:   I48.91 Atrial fibrillation.    -------------------------------------------------------------------  History:  PMH: Acquired from the patient and from the patient&'s  chart. PMH: AAA. Atrial Fibrillation. Peripheral vascular  disease. Murmur. Risk factors: Hypertension.   -------------------------------------------------------------------  Study Conclusions   - Left ventricle: The cavity size was normal. Wall thickness was  normal. Systolic function was normal. The estimated ejection  fraction was in the range of 60% to 65%. Wall motion was normal;  there were no regional wall motion abnormalities. Features are  consistent with a pseudonormal left ventricular filling pattern,  with concomitant abnormal relaxation and increased filling  pressure (grade 2 diastolic dysfunction).  - Aortic valve: Moderately calcified annulus.  - Left atrium: The atrium was moderately dilated.   EKG: Independently reviewed.  Vent. rate 73 BPM PR interval * ms QRS duration 90 ms QT/QTc 408/450 ms P-R-T axes 80 35 142 Sinus rhythm Abnrm T, consider ischemia, anterolateral lds  Assessment/Plan Principal Problem:   Near syncope Observation/telemetry. Gentle IV hydration. Fall precautions. Repeat EKG in the morning. Check echocardiogram in a.m.  Active Problems:   Paroxysmal atrial fibrillation (HCC) CHA?DS?-VASc Score of at least 5. Not on anticoagulation or rate control meds. Continue aspirin 81 mg p.o. daily.    Dyslipidemia Continue pravastatin 10 mg p.o. daily.    Normocytic anemia Monitor hematocrit and hemoglobin. B12 deficiency on anemia panel 2 years ago.    Diabetes mellitus type II, non insulin dependent (HCC) Carbohydrate modified diet. CBG monitoring before meals and bedtime.    CKD (chronic kidney disease) stage 4, GFR 15-29 ml/min (HCC) Close to previous baseline. Monitor renal function electrolytes.    DVT prophylaxis: Lovenox SQ. Code Status:   Full code. Family  Communication: Disposition Plan:   Patient is from:  Home.  Anticipated DC to:  Home.  Anticipated DC date:  08/14/2020.  Anticipated DC barriers: Clinical status and pending echocardiogram.  Consults called: Admission status:  Observation/telemetry.   Severity of Illness: High due to syncopal episode patient with multiple risk factors for cardiovascular events.  The patient will need to remain for overnight monitoring and further work-up in the morning.  Reubin Milan MD [facility]  How to contact the [facility] Attending or Consulting provider [place] or covering provider during after hours [place], for this patient?   1. Check the care team in [facility] and look for a) attending/consulting [facility] provider listed and b) the [facility] team listed 2. Log into [email or web address] and use [place]'s universal password to access. If you do not have the password, please contact the hospital operator. 3. Locate the [facility] provider you are looking for under [facility] and page to a number that you can be directly reached. 4. If you still have difficulty reaching the provider, please page the [facility] (Director on Call)  for the Hospitalists listed on amion for assistance.  08/13/2020, 11:45 PM   This document was prepared using Dragon voice recognition software and may contain some unintended transcription errors.

## 2020-08-13 NOTE — ED Notes (Signed)
Pt pulling off leads and talking to herself, md aware.

## 2020-08-13 NOTE — ED Triage Notes (Signed)
Pt arrived via [facility] for near syncope. Pt was walking her dog and became dizzy and fell. Pt states she doesn't believe she hit her head. Pt states takes ASA. Per EMS pt had + orthostatics. bp sitting 130/80, standing bp 100/70. EMS gave NS 500cc. Per EMS pt has T wave inversion on 12 lead. PT is &OX4. Pt is NSR on monitor

## 2020-08-14 ENCOUNTER — Observation Stay (HOSPITAL_COMMUNITY): Payer: Medicare HMO

## 2020-08-14 DIAGNOSIS — R55 Syncope and collapse: Secondary | ICD-10-CM

## 2020-08-14 LAB — CBC
HCT: 39.9 % (ref 36.0–46.0)
Hemoglobin: 12.8 g/dL (ref 12.0–15.0)
MCH: 28.4 pg (ref 26.0–34.0)
MCHC: 32.1 g/dL (ref 30.0–36.0)
MCV: 88.5 fL (ref 80.0–100.0)
Platelets: 180 10*3/uL (ref 150–400)
RBC: 4.51 MIL/uL (ref 3.87–5.11)
RDW: 13.1 % (ref 11.5–15.5)
WBC: 8.8 10*3/uL (ref 4.0–10.5)
nRBC: 0 % (ref 0.0–0.2)

## 2020-08-14 LAB — ECHOCARDIOGRAM COMPLETE
AR max vel: 0.92 cm2
AV Area VTI: 0.94 cm2
AV Area mean vel: 0.85 cm2
AV Mean grad: 16.5 mmHg
AV Peak grad: 24.5 mmHg
Ao pk vel: 2.48 m/s
Area-P 1/2: 4.15 cm2
Height: 65.5 in
S' Lateral: 1.8 cm
Weight: 1856 oz

## 2020-08-14 LAB — BASIC METABOLIC PANEL
Anion gap: 10 (ref 5–15)
BUN: 21 mg/dL (ref 8–23)
CO2: 22 mmol/L (ref 22–32)
Calcium: 8.8 mg/dL — ABNORMAL LOW (ref 8.9–10.3)
Chloride: 104 mmol/L (ref 98–111)
Creatinine, Ser: 2.09 mg/dL — ABNORMAL HIGH (ref 0.44–1.00)
GFR, Estimated: 24 mL/min — ABNORMAL LOW (ref >60)
Glucose, Bld: 161 mg/dL — ABNORMAL HIGH (ref 70–99)
Potassium: 3.6 mmol/L (ref 3.5–5.1)
Sodium: 136 mmol/L (ref 135–145)

## 2020-08-14 LAB — SARS CORONAVIRUS 2 (TAT 6-24 HRS): SARS Coronavirus 2: NEGATIVE

## 2020-08-14 LAB — AMMONIA: Ammonia: 11 umol/L (ref 9–35)

## 2020-08-14 LAB — TSH: TSH: 2.709 u[IU]/mL (ref 0.350–4.500)

## 2020-08-14 LAB — VITAMIN B12: Vitamin B-12: 937 pg/mL — ABNORMAL HIGH (ref 180–914)

## 2020-08-14 LAB — CBG MONITORING, ED: Glucose-Capillary: 148 mg/dL — ABNORMAL HIGH (ref 70–99)

## 2020-08-14 MED ORDER — AMLODIPINE BESYLATE 5 MG PO TABS
5.0000 mg | ORAL_TABLET | Freq: Every day | ORAL | Status: DC
  Administered 2020-08-14 – 2020-09-01 (×19): 5 mg via ORAL
  Filled 2020-08-14 (×19): qty 1

## 2020-08-14 MED ORDER — VITAMIN B-12 1000 MCG PO TABS
1000.0000 ug | ORAL_TABLET | Freq: Every day | ORAL | Status: DC
  Administered 2020-08-14 – 2020-09-01 (×19): 1000 ug via ORAL
  Filled 2020-08-14 (×18): qty 1

## 2020-08-14 MED ORDER — PRAVASTATIN SODIUM 10 MG PO TABS
20.0000 mg | ORAL_TABLET | Freq: Every day | ORAL | Status: DC
  Administered 2020-08-14 – 2020-09-01 (×19): 20 mg via ORAL
  Filled 2020-08-14 (×19): qty 2

## 2020-08-14 MED ORDER — POLYETHYLENE GLYCOL 3350 17 G PO PACK
17.0000 g | PACK | Freq: Every day | ORAL | Status: DC
  Administered 2020-08-14 – 2020-08-31 (×16): 17 g via ORAL
  Filled 2020-08-14 (×18): qty 1

## 2020-08-14 MED ORDER — VITAMIN D 25 MCG (1000 UNIT) PO TABS
2000.0000 [IU] | ORAL_TABLET | Freq: Every day | ORAL | Status: DC
  Administered 2020-08-14 – 2020-09-01 (×19): 2000 [IU] via ORAL
  Filled 2020-08-14 (×19): qty 2

## 2020-08-14 MED ORDER — BISACODYL 10 MG RE SUPP: 10.0000 mg | Freq: Every day | RECTAL | Status: DC | PRN

## 2020-08-14 MED ORDER — SENNOSIDES-DOCUSATE SODIUM 8.6-50 MG PO TABS
1.0000 | ORAL_TABLET | Freq: Every day | ORAL | Status: DC
  Administered 2020-08-14 – 2020-08-31 (×17): 1 via ORAL
  Filled 2020-08-14 (×17): qty 1

## 2020-08-14 MED ORDER — ASPIRIN EC 81 MG PO TBEC
81.0000 mg | DELAYED_RELEASE_TABLET | Freq: Every day | ORAL | Status: DC
  Administered 2020-08-14 – 2020-09-01 (×19): 81 mg via ORAL
  Filled 2020-08-14 (×19): qty 1

## 2020-08-14 MED ORDER — LORATADINE 10 MG PO TABS
10.0000 mg | ORAL_TABLET | Freq: Every day | ORAL | Status: DC | PRN
  Administered 2020-08-31: 10 mg via ORAL
  Filled 2020-08-14: qty 1

## 2020-08-14 MED ORDER — ADULT MULTIVITAMIN W/MINERALS CH
1.0000 | ORAL_TABLET | Freq: Every day | ORAL | Status: DC
  Administered 2020-08-14 – 2020-09-01 (×19): 1 via ORAL
  Filled 2020-08-14 (×19): qty 1

## 2020-08-14 MED ORDER — DOCUSATE SODIUM 100 MG PO CAPS
100.0000 mg | ORAL_CAPSULE | Freq: Two times a day (BID) | ORAL | Status: DC
  Administered 2020-08-14 – 2020-09-01 (×36): 100 mg via ORAL
  Filled 2020-08-14 (×36): qty 1

## 2020-08-14 MED ORDER — ESCITALOPRAM OXALATE 10 MG PO TABS
5.0000 mg | ORAL_TABLET | Freq: Every day | ORAL | Status: DC
  Administered 2020-08-14 – 2020-09-01 (×19): 5 mg via ORAL
  Filled 2020-08-14 (×19): qty 1

## 2020-08-14 NOTE — ED Notes (Signed)
Assisted patient with bedpan for urination, assisted patient with breakfast.

## 2020-08-14 NOTE — Progress Notes (Signed)
Echocardiogram 2D Echocardiogram has been performed.  Luisa Hart RDCS 08/14/2020, 9:42 AM

## 2020-08-14 NOTE — ED Notes (Signed)
Pt pulled purwick out and was attempting to eat it. Cleaned pt up (with new gown and brief) and put ekg leads and other vital sign equipment on.

## 2020-08-14 NOTE — Progress Notes (Signed)
PROGRESS NOTE    Sandra Sheppard  [id number] DOB: [birth year] DOA: 08/13/2020 PCP: Prince Solian, MD   Chief Complaint  Patient presents with  . Near Syncope  Brief Narrative: 75 year old female with history of AAA, PAF, carotid stenosis, aortic and mitral valve stenosis, stage IV CKD, T2DM, hyperlipidemia, hypertension, PVD, osteoporosis brought to the ED after getting lightheaded, off balance and nearly passing out while walking her dog around her neighborhood 08/13/20.  As per reports she ended up crashing into some bushes neighbor came and assisted and EMS was called.  Patient denies any chest pain dyspnea palpitation diaphoresis PND leg edema bowel or bladder incontinence or seizure-like activity or fever.  In the ED vitals stable on room air, received fluids and Ativan for MRI, he had normal CBC fairly stable INR 1.1 alcohol level normal 2.9 and 10 CMP bicarb 17 and lab 11 rest normal BUN 23 creatinine 2.5,LFTs stable. Chest x-ray no active disease, CT head did not show any acute finding, MRI brain limited due to motion artifact but no acute intracranial abnormality, age-related cerebral atrophy with moderate to advanced chronic microvascular ischemic changes are noted with remote lacunar infarcts on the right basal ganglia and left cerebellum. EKG stable QTC sinus rhythm anterolateral T wave changes Patient admitted for work-up of near-syncope  Subjective: Seen and examined this morning Patient was alert awake but appears to be confused not very interactive and did not answer all questions She kept on calling " Bobby Go away". She told me her dog's name is Mortimer Fries She is able to move all her extremities  Assessment & Plan:  Near syncope/encephalopathy with confusion likely acute metabolic encephalopathy: CT head MRI brain-limited due to motion but no acute finding, chronic microvascular ischemic changes.  She is nonfocal on exam.  We will keep on IV fluid hydration check orthostatic  vitals, echocardiogram was done-that shows EF 65 to 70% G1 DD, bioprosthetic aortic valve present valve gradient 16.5.Obtain PT OT evaluation check ammonia level.Fairly unremarkable, UA.  Later in the day RN reports she was able to answer all questions, and does not appear confused.  Will monitor overnight.  Paroxysmal atrial fibrillation not on anticoagulation or rate control medication.  Home aspirin 80 mg.  Advise follow-up with her PCP.  Dyslipidemia:Continue statin  Hypertension on amlodipine 10 mg at home will resume at 5 mg  Normocytic anemia:H&H stable.  Anxiety/depression resume her Lexapro  Diabetes mellitus type II, non insulin dependent.  Blood sugar remains controlled keep on sliding scale. Recent Labs  Lab 08/13/20 1633 08/14/20 0455  GLUCAP 172* 148*   CKD (chronic kidney disease) stage 4,baseline and is stable at this time. Recent Labs    10/09/19 1206 08/13/20 1640 08/14/20 0235  BUN 30* 23 21  CREATININE 2.38* 2.54* 2.09*   Covid status pending.  Low weight-dietitian consulted.   Multiple falls-3 falls last year per her sister "Busted her head open last year"  Per RN Sister was trying to talk to her into go to [place].  PTOT to see her.  Diet Order            Diet Heart Room service appropriate? Yes; Fluid consistency: Thin  Diet effective now                Patient's Body mass index is 19.01 kg/m.  DVT prophylaxis: enoxaparin (LOVENOX) injection 30 mg Start: 08/14/20 0000 Code Status:   Code Status: Full Code  Family Communication: plan of care discussed with patient  at bedside and her sister.  Status is: Observation Patient remains hospitalized for ongoing work-up of her syncope.  Dispo: The patient is from: Home              Anticipated d/c is to: ALF vs Home              Patient currently is not medically stable to d/c.   Difficult to place patient No       Unresulted Labs (From admission, onward)          Start      Ordered   08/20/20 0500  Creatinine, serum  (enoxaparin (LOVENOX)    CrCl < 30 ml/min)  Weekly,   R     Comments: while on enoxaparin therapy.    08/13/20 2340   08/14/20 1432  Ammonia  Add-on,   R        08/14/20 1437   08/14/20 1432  TSH  Add-on,   R        08/14/20 1437   08/14/20 1432  Vitamin B12  Add-on,   R        08/14/20 1437   08/14/20 0849  SARS CORONAVIRUS 2 (TAT 6-24 HRS) Nasopharyngeal Nasopharyngeal Swab  (Tier 3 - Symptomatic/asymptomatic with Precautions)  Once,   STAT       Question Answer Comment  Is this test for diagnosis or screening Screening   Symptomatic for COVID-19 as defined by CDC No   Hospitalized for COVID-19 No   Admitted to ICU for COVID-19 No   Previously tested for COVID-19 Yes   Resident in a congregate (group) care setting No   Employed in healthcare setting No   Pregnant No   Has patient completed COVID vaccination(s) (2 doses of Pfizer/Moderna 1 dose of [employer]) Unknown      08/14/20 0849          Medications reviewed:  Scheduled Meds: . amLODipine  5 mg Oral Daily  . aspirin  81 mg Oral Daily  . docusate sodium  100 mg Oral BID  . enoxaparin (LOVENOX) injection  30 mg Subcutaneous QHS  . escitalopram  5 mg Oral Daily  . multivitamin  1 capsule Oral Daily  . pravastatin  20 mg Oral Daily  . sodium chloride flush  3 mL Intravenous Q12H  . cyanocobalamin  1,000 mcg Oral Daily  . Vitamin D-3  2,000 Units Oral Daily   Continuous Infusions:  Consultants:see note  Procedures:see note  Antimicrobials: Anti-infectives (From admission, onward)   None     Culture/Microbiology    Component Value Date/Time   SDES URINE, RANDOM 10/09/2019 1930   SPECREQUEST NONE 10/09/2019 1930   CULT  10/09/2019 1930    NO GROWTH Performed at [facility], [place] [address] [place], [place] [address]    REPTSTATUS 10/10/2019 FINAL 10/09/2019 1930    Other culture-see note  Objective: Vitals: Today's Vitals   08/14/20 0817  08/14/20 1200 08/14/20 1345 08/14/20 1400  BP: (!) 148/82 (!) 144/59  (!) 158/60  Pulse: 63 (!) 51 93 75  Resp: 18 16 (!) 26 18  Temp: 98.3 F (36.8 C)     TempSrc: Oral     SpO2: 95% 96% 95% 96%  Weight:      Height:      PainSc: 7       No intake or output data in the 24 hours ending 08/14/20 1437 Filed Weights   08/13/20 1625  Weight: 52.6  kg   Weight change:   Intake/Output from previous day: No intake/output data recorded. Intake/Output this shift: No intake/output data recorded. Filed Weights   08/13/20 1625  Weight: 52.6 kg    Examination: General exam: Alert awake interactive mildly confused  HEENT:Oral mucosa moist, Ear/Nose WNL grossly,dentition normal. Respiratory system: bilaterally diminished,no use of accessory muscle, non tender. Cardiovascular system: S1 & S2 +, regular, No JVD. Gastrointestinal system: Abdomen soft, NT,ND, BS+. Nervous System:Alert, awake, moving extremities and grossly nonfocal Extremities: No edema, distal peripheral pulses palpable.  Skin: No rashes,no icterus. MSK: Normal muscle bulk,tone, power  Data Reviewed: I have personally reviewed following labs and imaging studies CBC: Recent Labs  Lab 08/13/20 1640 08/14/20 0235  WBC 6.3 8.8  NEUTROABS 4.2  --   HGB 11.9* 12.8  HCT 36.8 39.9  MCV 89.3 88.5  PLT 174 [id number]   Basic Metabolic Panel: Recent Labs  Lab 08/13/20 1640 08/14/20 0235  NA 133* 136  K 4.0 3.6  CL 105 104  CO2 17* 22  GLUCOSE 183* 161*  BUN 23 21  CREATININE 2.54* 2.09*  CALCIUM 8.6* 8.8*   GFR: Estimated Creatinine Clearance: 19.3 mL/min (A) (by C-G formula based on SCr of 2.09 mg/dL (H)). Liver Function Tests: Recent Labs  Lab 08/13/20 1640  AST 14*  ALT 8  ALKPHOS 84  BILITOT 0.6  PROT 6.5  ALBUMIN 3.4*   No results for input(s): LIPASE, AMYLASE in the last 168 hours. No results for input(s): AMMONIA in the last 168 hours. Coagulation Profile: Recent Labs  Lab 08/13/20 1640  INR 1.1    Cardiac Enzymes: No results for input(s): CKTOTAL, CKMB, CKMBINDEX, TROPONINI in the last 168 hours. BNP (last 3 results) No results for input(s): PROBNP in the last 8760 hours. HbA1C: No results for input(s): HGBA1C in the last 72 hours. CBG: Recent Labs  Lab 08/13/20 1633 08/14/20 0455  GLUCAP 172* 148*   Lipid Profile: No results for input(s): CHOL, HDL, LDLCALC, TRIG, CHOLHDL, LDLDIRECT in the last 72 hours. Thyroid Function Tests: No results for input(s): TSH, T4TOTAL, FREET4, T3FREE, THYROIDAB in the last 72 hours. Anemia Panel: No results for input(s): VITAMINB12, FOLATE, FERRITIN, TIBC, IRON, RETICCTPCT in the last 72 hours. Sepsis Labs: No results for input(s): PROCALCITON, LATICACIDVEN in the last 168 hours.  No results found for this or any previous visit (from the past 240 hour(s)).   Radiology Studies: CT Head Wo Contrast  Result Date: 08/13/2020 CLINICAL DATA:  Mental status change EXAM: CT HEAD WITHOUT CONTRAST TECHNIQUE: Contiguous axial images were obtained from the base of the skull through the vertex without intravenous contrast. COMPARISON:  October 09, 2019 FINDINGS: Brain: No evidence of acute territorial infarction, hemorrhage, hydrocephalus,extra-axial collection or mass lesion/mass effect. There is dilatation the ventricles and sulci consistent with age-related atrophy. Low-attenuation changes in the deep white matter consistent with small vessel ischemia. Vascular: No hyperdense vessel or unexpected calcification. Skull: The skull is intact. No fracture or focal lesion identified. Sinuses/Orbits: The visualized paranasal sinuses and mastoid air cells are clear. The orbits and globes intact. Other: None IMPRESSION: No acute intracranial abnormality. Findings consistent with age related atrophy and chronic small vessel ischemia Electronically Signed   By: Prudencio Pair M.D.   On: 08/13/2020 18:23   MR BRAIN WO CONTRAST  Result Date: 08/13/2020 CLINICAL DATA:   Initial evaluation for neuro deficit, stroke suspected. EXAM: MRI HEAD WITHOUT CONTRAST TECHNIQUE: Multiplanar, multiecho pulse sequences of the brain and surrounding structures were obtained without  intravenous contrast. COMPARISON:  Prior CT from earlier the same day. FINDINGS: Brain: Examination severely limited due to extensive motion artifact. Generalized age-related cerebral atrophy. Patchy and confluent T2/FLAIR hyperintensity within the periventricular and deep white matter both cerebral hemispheres, most consistent with chronic small vessel ischemic disease, moderate to advanced in nature. Remote lacunar infarct present at the right basal ganglia. Probable additional small remote left cerebellar infarct. No abnormal foci of restricted diffusion to suggest acute or subacute ischemia. Gray-white matter differentiation maintained. No encephalomalacia to suggest chronic cortical infarction. No visible acute intracranial hemorrhage. Possible small foci of chronic hemosiderin deposition at the left cerebellum and right temporal occipital region. No acute hemorrhage seen on prior CT. No mass lesion, midline shift or mass effect. No hydrocephalus or extra-axial fluid collection. Pituitary gland suprasellar region within normal limits. Midline structures intact. Vascular: Major intracranial vascular flow voids are grossly preserved at the skull base. Skull and upper cervical spine: Craniocervical junction within normal limits. Bone marrow signal intensity normal. No scalp soft tissue abnormality. Sinuses/Orbits: Globes and orbital soft tissues grossly within normal limits. Paranasal sinuses are largely clear. No appreciable mastoid effusion. Other: None. IMPRESSION: 1. Technically limited exam due to extensive motion artifact. 2. No acute intracranial abnormality. 3. Age-related cerebral atrophy with moderate to advanced chronic microvascular ischemic disease, with remote lacunar infarcts at the right basal ganglia  and left cerebellum. Electronically Signed   By: Jeannine Boga M.D.   On: 08/13/2020 22:45   DG Chest Port 1 View  Result Date: 08/13/2020 CLINICAL DATA:  Syncope. EXAM: PORTABLE CHEST 1 VIEW COMPARISON:  July 04, 2016. FINDINGS: The heart size and mediastinal contours are within normal limits. Both lungs are clear. No pneumothorax or pleural effusion is noted. The visualized skeletal structures are unremarkable. IMPRESSION: No active disease. Aortic Atherosclerosis (ICD10-I70.0). Electronically Signed   By: Marijo Conception M.D.   On: 08/13/2020 16:51   ECHOCARDIOGRAM COMPLETE  Result Date: 08/14/2020    ECHOCARDIOGRAM REPORT   Patient Name:   FAITHMARIE BARSHINGER Date of Exam: 08/14/2020 Medical Rec #:  [id number]      Height:       65.5 in Accession #:    [id number]     Weight:       116.0 lb Date of Birth:  01/20/1946       BSA:          1.578 m Patient Age:    19 years       BP:           148/82 mmHg Patient Gender: F              HR:           65 bpm. Exam Location:  Inpatient Procedure: 2D Echo, 3D Echo, Cardiac Doppler, Color Doppler and Strain Analysis Indications:    Syncope  History:        Patient has prior history of Echocardiogram examinations, most                 recent 05/26/2017. H/O Bio AVR and Aortic Valve Disease,                 Arrythmias:Atrial Fibrillation; Risk Factors:Dyslipidemia.                 Aortic Valve: 19 mm Edwards MagnaEase bioprosthetic valve is                 present in the aortic position. Procedure Date:  08/14/2008.  Sonographer:    Luisa Hart RDCS Referring Phys: [id number] [place]  1. Left ventricular ejection fraction, by estimation, is 65 to 70%. The left ventricle has normal function. The left ventricle has no regional wall motion abnormalities. Left ventricular diastolic parameters are consistent with Grade I diastolic dysfunction (impaired relaxation).  2. Right ventricular systolic function is mildly reduced. The right ventricular size is  normal. There is normal pulmonary artery systolic pressure.  3. The mitral valve is abnormal. No evidence of mitral valve regurgitation.  4. The aortic valve has been repaired/replaced. Aortic valve regurgitation is not visualized. There is a 19 mm Edwards MagnaEase bioprosthetic valve present in the aortic position. Procedure Date: 08/14/2008. Aortic valve area, by VTI measures 0.94 cm. Aortic valve mean gradient measures 16.5 mmHg.  5. The inferior vena cava is normal in size with greater than 50% respiratory variability, suggesting right atrial pressure of 3 mmHg. Comparison(s): Changes from prior study are noted. 05/26/2017: LVEF 60-65%, mean AOV gradient 17 mmHg. FINDINGS  Left Ventricle: Left ventricular ejection fraction, by estimation, is 65 to 70%. The left ventricle has normal function. The left ventricle has no regional wall motion abnormalities. The left ventricular internal cavity size was small. There is no left ventricular hypertrophy. Left ventricular diastolic parameters are consistent with Grade I diastolic dysfunction (impaired relaxation). Indeterminate filling pressures. Right Ventricle: The right ventricular size is normal. No increase in right ventricular wall thickness. Right ventricular systolic function is mildly reduced. There is normal pulmonary artery systolic pressure. The tricuspid regurgitant velocity is 2.14 m/s, and with an assumed right atrial pressure of 3 mmHg, the estimated right ventricular systolic pressure is [id number] mmHg. Left Atrium: Left atrial size was normal in size. Right Atrium: Right atrial size was normal in size. Pericardium: There is no evidence of pericardial effusion. Mitral Valve: The mitral valve is abnormal. Mild to moderate mitral annular calcification. No evidence of mitral valve regurgitation. Tricuspid Valve: The tricuspid valve is grossly normal. Tricuspid valve regurgitation is mild. Aortic Valve: The aortic valve has been repaired/replaced. Aortic valve  regurgitation is not visualized. Aortic valve mean gradient measures 16.5 mmHg. Aortic valve peak gradient measures 24.5 mmHg. Aortic valve area, by VTI measures 0.94 cm. There is a  19 mm Edwards MagnaEase bioprosthetic valve present in the aortic position. Procedure Date: 08/14/2008. Pulmonic Valve: The pulmonic valve was normal in structure. Pulmonic valve regurgitation is not visualized. Aorta: The aortic root and ascending aorta are structurally normal, with no evidence of dilitation. Venous: The inferior vena cava is normal in size with greater than 50% respiratory variability, suggesting right atrial pressure of 3 mmHg. IAS/Shunts: The interatrial septum is aneurysmal. No atrial level shunt detected by color flow Doppler.  LEFT VENTRICLE PLAX 2D LVIDd:         4.00 cm  Diastology LVIDs:         1.80 cm  LV e' medial:    6.08 cm/s LV PW:         1.00 cm  LV E/e' medial:  12.0 LV IVS:        1.10 cm  LV e' lateral:   8.27 cm/s LVOT diam:     2.10 cm  LV E/e' lateral: 8.8 LV SV:         61 LV SV Index:   38 LVOT Area:     3.46 cm  RIGHT VENTRICLE RV S prime:     8.68 cm/s TAPSE (M-mode): 1.1 cm LEFT  ATRIUM           Index       RIGHT ATRIUM           Index LA diam:      3.20 cm 2.03 cm/m  RA Area:     12.60 cm LA Vol (A2C): 10.6 ml 6.72 ml/m  RA Volume:   31.70 ml  20.09 ml/m LA Vol (A4C): 42.0 ml 26.61 ml/m  AORTIC VALVE                    PULMONIC VALVE AV Area (Vmax):    0.92 cm     PV Vmax:       0.61 m/s AV Area (Vmean):   0.85 cm     PV Peak grad:  1.5 mmHg AV Area (VTI):     0.94 cm AV Vmax:           247.50 cm/s AV Vmean:          192.000 cm/s AV VTI:            0.644 m AV Peak Grad:      24.5 mmHg AV Mean Grad:      16.5 mmHg LVOT Vmax:         65.80 cm/s LVOT Vmean:        46.900 cm/s LVOT VTI:          0.175 m LVOT/AV VTI ratio: 0.27  AORTA Ao Root diam: 2.80 cm Ao Asc diam:  3.05 cm MITRAL VALVE               TRICUSPID VALVE MV Area (PHT): 4.15 cm    TR Peak grad:   18.3 mmHg MV Decel Time:  183 msec    TR Vmax:        214.00 cm/s MV E velocity: 72.80 cm/s MV A velocity: 82.00 cm/s  SHUNTS MV E/A ratio:  0.89        Systemic VTI:  0.18 m                            Systemic Diam: 2.10 cm Lyman Bishop MD Electronically signed by Lyman Bishop MD Signature Date/Time: 08/14/2020/11:31:48 AM    Final      LOS: 0 days   Antonieta Pert, MD [facility]  08/14/2020, 2:37 PM

## 2020-08-14 NOTE — ED Notes (Signed)
Lunch Tray Ordered @ 1034. 

## 2020-08-14 NOTE — ED Notes (Signed)
Pt ripping off leads, will not keep them on

## 2020-08-15 DIAGNOSIS — R55 Syncope and collapse: Secondary | ICD-10-CM | POA: Diagnosis not present

## 2020-08-15 LAB — BASIC METABOLIC PANEL
Anion gap: 9 (ref 5–15)
BUN: 18 mg/dL (ref 8–23)
CO2: 22 mmol/L (ref 22–32)
Calcium: 9.2 mg/dL (ref 8.9–10.3)
Chloride: 104 mmol/L (ref 98–111)
Creatinine, Ser: 1.98 mg/dL — ABNORMAL HIGH (ref 0.44–1.00)
GFR, Estimated: 26 mL/min — ABNORMAL LOW (ref >60)
Glucose, Bld: 143 mg/dL — ABNORMAL HIGH (ref 70–99)
Potassium: 4.4 mmol/L (ref 3.5–5.1)
Sodium: 135 mmol/L (ref 135–145)

## 2020-08-15 LAB — GLUCOSE, CAPILLARY: Glucose-Capillary: 151 mg/dL — ABNORMAL HIGH (ref 70–99)

## 2020-08-15 MED ORDER — SODIUM CHLORIDE 0.9 % IV SOLN: INTRAVENOUS | Status: AC

## 2020-08-15 NOTE — Progress Notes (Signed)
PROGRESS NOTE    Sandra Sheppard  [id number] DOB: [birth year] DOA: 08/13/2020 PCP: Prince Solian, MD   Chief Complaint  Patient presents with  . Near Syncope  Brief Narrative: 75 year old female with history of AAA, PAF, carotid stenosis, aortic and mitral valve stenosis, stage IV CKD, T2DM, hyperlipidemia, hypertension, PVD, osteoporosis brought to the ED after getting lightheaded, off balance and nearly passing out while walking her dog around her neighborhood 08/13/20.  As per reports she ended up crashing into some bushes neighbor came and assisted and EMS was called.  Patient denies any chest pain dyspnea palpitation diaphoresis PND leg edema bowel or bladder incontinence or seizure-like activity or fever.  In the ED vitals stable on room air, received fluids and Ativan for MRI, he had normal CBC fairly stable INR 1.1 alcohol level normal 2.9 and 10 CMP bicarb 17 and lab 11 rest normal BUN 23 creatinine 2.5,LFTs stable. Chest x-ray no active disease, CT head did not show any acute finding, MRI brain limited due to motion artifact but no acute intracranial abnormality, age-related cerebral atrophy with moderate to advanced chronic microvascular ischemic changes are noted with remote lacunar infarcts on the right basal ganglia and left cerebellum. EKG stable QTC sinus rhythm anterolateral T wave changes Patient admitted for work-up of near-syncope  Subjective: Nursing also noticing that patient has been intermittently confused.   Seen this morning she is alert awake, thinks it is 2023-she does not feel ready for home yet-iInterested to go to facility.    Assessment & Plan:  Near syncope Encephalopathy with confusion likely acute metabolic encephalopathy vs baseline memory issues: CT head MRI brain-limited due to motion but no acute finding, chronic microvascular ischemic changes.  Grossly nonfocal conversant not oriented to date this am. ?  Sundowning in the evening. Echo shows EF 65 to  70% G1 DD, bioprosthetic aortic valve present valve gradient 16.5. ammonia level nl, fairly unremarkable UA.  No evidence of infection.  Did have AKI creatinine at 2.50 improved to 1.9 with hydration indicating volume depletion which could be the etiology of her dizziness.  B12 level is stable TSH euthyroid. Obtain PT OT evaluation likely need facility question assisted-living facility -patient sister were trying for this for sometime, pt seems agreeable now.  Nursing to check orthostatic today.  Paroxysmal atrial fibrillation not on anticoagulation or rate control medication.  Continue her aspirin.  Sister reports she has had 3 falls last year and "Busted her head open last year" likely why she is not on anticoagulation.Nevertheless would advise to follow-up with her primary cardiologist/PCP regarding anticoagulation discussion.  For now continue her home aspirin.  Dyslipidemia: Continue statin  Hypertension on amlodipine 10 mg at home appears stable on 5 mg-continue the same given her presentation.  Normocytic anemia:H&H stable.  Anxiety/depression continue her Lexapro  Diabetes mellitus type II, non insulin dependent.  Blood sugar well controlled on sliding scale.   Recent Labs  Lab 08/13/20 1633 08/14/20 0455 08/15/20 0620  GLUCAP 172* 148* 151*   AKI on ?CKD (chronic kidney disease) stage 4,baseline creatinine pastel have been in 2.3.  On admission 2.5 and with IV hydration resolved to 1.9 indicating degree of AKI Recent Labs    10/09/19 1206 08/13/20 1640 08/14/20 0235 08/15/20 0804  BUN 30* '23 21 18  '$ CREATININE 2.38* 2.54* 2.09* 1.98*   Covid status neg.  Low weight-dietitian consulted Multiple falls-3 falls last year per her sister "Busted her head open last year"  Diet Order  Diet Heart Room service appropriate? Yes; Fluid consistency: Thin  Diet effective now                Patient's Body mass index is 19.01 kg/m.  DVT prophylaxis: enoxaparin  (LOVENOX) injection 30 mg Start: 08/14/20 0000 Code Status:   Code Status: Full Code  Family Communication: plan of care discussed with patient at bedside and her sister over the phone.  Status is: admitted as Observation Patient remains hospitalized for ongoing management of her presentation including intermittent confusion   Dispo: The patient is from: Home-lives in apartment              Anticipated d/c is to: ALF  Vs SNF, PTOT Pending              Patient currently is not medically stable to d/c.   Difficult to place patient No    Unresulted Labs (From admission, onward)          Start     Ordered   08/20/20 0500  Creatinine, serum  (enoxaparin (LOVENOX)    CrCl < 30 ml/min)  Weekly,   R     Comments: while on enoxaparin therapy.    08/13/20 2340   08/16/20 0500  CBC  Daily,   R     Question:  Specimen collection method  Answer:  Lab=Lab collect   08/15/20 0945   08/16/20 [id number]  Basic metabolic panel  Daily,   R     Question:  Specimen collection method  Answer:  Lab=Lab collect   08/15/20 0945          Medications reviewed:  Scheduled Meds: . amLODipine  5 mg Oral Daily  . aspirin EC  81 mg Oral Daily  . cholecalciferol  2,000 Units Oral Daily  . docusate sodium  100 mg Oral BID  . enoxaparin (LOVENOX) injection  30 mg Subcutaneous QHS  . escitalopram  5 mg Oral Daily  . multivitamin with minerals  1 tablet Oral Daily  . polyethylene glycol  17 g Oral Daily  . pravastatin  20 mg Oral Daily  . senna-docusate  1 tablet Oral QHS  . sodium chloride flush  3 mL Intravenous Q12H  . cyanocobalamin  1,000 mcg Oral Daily   Continuous Infusions:  Consultants:see note  Procedures:see note  Antimicrobials: Anti-infectives (From admission, onward)   None     Culture/Microbiology    Component Value Date/Time   SDES URINE, RANDOM 10/09/2019 1930   SPECREQUEST NONE 10/09/2019 1930   CULT  10/09/2019 1930    NO GROWTH Performed at [facility], [place] [address] [place], [place] [address]    REPTSTATUS 10/10/2019 FINAL 10/09/2019 1930    Other culture-see note  Objective: Vitals: Today's Vitals   08/15/20 0747 08/15/20 0800 08/15/20 0938 08/15/20 1135  BP: 138/84  (!) 141/78 115/70  Pulse: 69   64  Resp: 15   15  Temp: 97.6 F (36.4 C)   97.6 F (36.4 C)  TempSrc: Oral   Oral  SpO2: 97%   96%  Weight:      Height:      PainSc:  Asleep 0-No pain     Intake/Output Summary (Last 24 hours) at 08/15/2020 1147 Last data filed at 08/15/2020 0941 Gross per 24 hour  Intake 726 ml  Output --  Net 726 ml   Filed Weights   08/13/20 1625  Weight: 52.6 kg   Weight change:   Intake/Output from previous  day: 03/04 0701 - 03/05 0700 In: 363 [P.O.:360; I.V.:3] Out: -  Intake/Output this shift: Total I/O In: 363 [P.O.:360; I.V.:3] Out: -  Filed Weights   08/13/20 1625  Weight: 52.6 kg    Examination: General exam: AA oriented to self place but not to date HEENT:Oral mucosa moist, Ear/Nose WNL grossly, dentition normal. Respiratory system: bilaterally diminishedd,no wheezing or crackles,no use of accessory muscle Cardiovascular system: S1 & S2 +, No JVD,. Gastrointestinal system: Abdomen soft, NT,ND, BS+ Nervous System:Alert, awake, moving extremities and grossly nonfocal Extremities: No edema, distal peripheral pulses palpable.  Skin: No rashes,no icterus. MSK: Normal muscle bulk,tone, power    Data Reviewed: I have personally reviewed following labs and imaging studies CBC: Recent Labs  Lab 08/13/20 1640 08/14/20 0235  WBC 6.3 8.8  NEUTROABS 4.2  --   HGB 11.9* 12.8  HCT 36.8 39.9  MCV 89.3 88.5  PLT 174 [id number]   Basic Metabolic Panel: Recent Labs  Lab 08/13/20 1640 08/14/20 0235 08/15/20 0804  NA 133* 136 135  K 4.0 3.6 4.4  CL 105 104 104  CO2 17* 22 22  GLUCOSE 183* 161* 143*  BUN '23 21 18  '$ CREATININE 2.54* 2.09* 1.98*  CALCIUM 8.6* 8.8* 9.2   GFR: Estimated Creatinine Clearance: 20.4 mL/min (A) (by C-G  formula based on SCr of 1.98 mg/dL (H)). Liver Function Tests: Recent Labs  Lab 08/13/20 1640  AST 14*  ALT 8  ALKPHOS 84  BILITOT 0.6  PROT 6.5  ALBUMIN 3.4*   No results for input(s): LIPASE, AMYLASE in the last 168 hours. Recent Labs  Lab 08/14/20 1603  AMMONIA 11   Coagulation Profile: Recent Labs  Lab 08/13/20 1640  INR 1.1   Cardiac Enzymes: No results for input(s): CKTOTAL, CKMB, CKMBINDEX, TROPONINI in the last 168 hours. BNP (last 3 results) No results for input(s): PROBNP in the last 8760 hours. HbA1C: No results for input(s): HGBA1C in the last 72 hours. CBG: Recent Labs  Lab 08/13/20 1633 08/14/20 0455 08/15/20 0620  GLUCAP 172* 148* 151*   Lipid Profile: No results for input(s): CHOL, HDL, LDLCALC, TRIG, CHOLHDL, LDLDIRECT in the last 72 hours. Thyroid Function Tests: Recent Labs    08/14/20 1603  TSH 2.709   Anemia Panel: Recent Labs    08/14/20 1603  VITAMINB12 937*   Sepsis Labs: No results for input(s): PROCALCITON, LATICACIDVEN in the last 168 hours.  Recent Results (from the past 240 hour(s))  SARS CORONAVIRUS 2 (TAT 6-24 HRS) Nasopharyngeal Nasopharyngeal Swab     Status: None   Collection Time: 08/14/20  8:49 AM   Specimen: Nasopharyngeal Swab  Result Value Ref Range Status   SARS Coronavirus 2 NEGATIVE NEGATIVE Final    Comment: (NOTE) SARS-CoV-2 target nucleic acids are NOT DETECTED.  The SARS-CoV-2 RNA is generally detectable in upper and lower respiratory specimens during the acute phase of infection. Negative results do not preclude SARS-CoV-2 infection, do not rule out co-infections with other pathogens, and should not be used as the sole basis for treatment or other patient management decisions. Negative results must be combined with clinical observations, patient history, and epidemiological information. The expected result is Negative.  Fact Sheet for Patients: SugarRoll.be  Fact  Sheet for Healthcare Providers: https://www.woods-mathews.com/  This test is not yet approved or cleared by the [place] FDA and  has been authorized for detection and/or diagnosis of SARS-CoV-2 by FDA under an Emergency Use Authorization (EUA). This EUA will remain  in effect (meaning this  test can be used) for the duration of the COVID-19 declaration under Se ction 564(b)(1) of the Act, 21 U.S.C. section 360bbb-3(b)(1), unless the authorization is terminated or revoked sooner.  Performed at [facility], [place] [address], [place], [place] [address]      Radiology Studies: CT Head Wo Contrast  Result Date: 08/13/2020 CLINICAL DATA:  Mental status change EXAM: CT HEAD WITHOUT CONTRAST TECHNIQUE: Contiguous axial images were obtained from the base of the skull through the vertex without intravenous contrast. COMPARISON:  October 09, 2019 FINDINGS: Brain: No evidence of acute territorial infarction, hemorrhage, hydrocephalus,extra-axial collection or mass lesion/mass effect. There is dilatation the ventricles and sulci consistent with age-related atrophy. Low-attenuation changes in the deep white matter consistent with small vessel ischemia. Vascular: No hyperdense vessel or unexpected calcification. Skull: The skull is intact. No fracture or focal lesion identified. Sinuses/Orbits: The visualized paranasal sinuses and mastoid air cells are clear. The orbits and globes intact. Other: None IMPRESSION: No acute intracranial abnormality. Findings consistent with age related atrophy and chronic small vessel ischemia Electronically Signed   By: Prudencio Pair M.D.   On: 08/13/2020 18:23   MR BRAIN WO CONTRAST  Result Date: 08/13/2020 CLINICAL DATA:  Initial evaluation for neuro deficit, stroke suspected. EXAM: MRI HEAD WITHOUT CONTRAST TECHNIQUE: Multiplanar, multiecho pulse sequences of the brain and surrounding structures were obtained without intravenous contrast. COMPARISON:  Prior  CT from earlier the same day. FINDINGS: Brain: Examination severely limited due to extensive motion artifact. Generalized age-related cerebral atrophy. Patchy and confluent T2/FLAIR hyperintensity within the periventricular and deep white matter both cerebral hemispheres, most consistent with chronic small vessel ischemic disease, moderate to advanced in nature. Remote lacunar infarct present at the right basal ganglia. Probable additional small remote left cerebellar infarct. No abnormal foci of restricted diffusion to suggest acute or subacute ischemia. Gray-white matter differentiation maintained. No encephalomalacia to suggest chronic cortical infarction. No visible acute intracranial hemorrhage. Possible small foci of chronic hemosiderin deposition at the left cerebellum and right temporal occipital region. No acute hemorrhage seen on prior CT. No mass lesion, midline shift or mass effect. No hydrocephalus or extra-axial fluid collection. Pituitary gland suprasellar region within normal limits. Midline structures intact. Vascular: Major intracranial vascular flow voids are grossly preserved at the skull base. Skull and upper cervical spine: Craniocervical junction within normal limits. Bone marrow signal intensity normal. No scalp soft tissue abnormality. Sinuses/Orbits: Globes and orbital soft tissues grossly within normal limits. Paranasal sinuses are largely clear. No appreciable mastoid effusion. Other: None. IMPRESSION: 1. Technically limited exam due to extensive motion artifact. 2. No acute intracranial abnormality. 3. Age-related cerebral atrophy with moderate to advanced chronic microvascular ischemic disease, with remote lacunar infarcts at the right basal ganglia and left cerebellum. Electronically Signed   By: Jeannine Boga M.D.   On: 08/13/2020 22:45   DG Chest Port 1 View  Result Date: 08/13/2020 CLINICAL DATA:  Syncope. EXAM: PORTABLE CHEST 1 VIEW COMPARISON:  July 04, 2016.  FINDINGS: The heart size and mediastinal contours are within normal limits. Both lungs are clear. No pneumothorax or pleural effusion is noted. The visualized skeletal structures are unremarkable. IMPRESSION: No active disease. Aortic Atherosclerosis (ICD10-I70.0). Electronically Signed   By: Marijo Conception M.D.   On: 08/13/2020 16:51   ECHOCARDIOGRAM COMPLETE  Result Date: 08/14/2020    ECHOCARDIOGRAM REPORT   Patient Name:   Sandra Sheppard Date of Exam: 08/14/2020 Medical Rec #:  [id number]  Height:       65.5 in Accession #:    [id number]     Weight:       116.0 lb Date of Birth:  Nov 03, 1945       BSA:          1.578 m Patient Age:    1 years       BP:           148/82 mmHg Patient Gender: F              HR:           65 bpm. Exam Location:  Inpatient Procedure: 2D Echo, 3D Echo, Cardiac Doppler, Color Doppler and Strain Analysis Indications:    Syncope  History:        Patient has prior history of Echocardiogram examinations, most                 recent 05/26/2017. H/O Bio AVR and Aortic Valve Disease,                 Arrythmias:Atrial Fibrillation; Risk Factors:Dyslipidemia.                 Aortic Valve: 19 mm Edwards MagnaEase bioprosthetic valve is                 present in the aortic position. Procedure Date: 08/14/2008.  Sonographer:    Luisa Hart RDCS Referring Phys: [id number] [place]  1. Left ventricular ejection fraction, by estimation, is 65 to 70%. The left ventricle has normal function. The left ventricle has no regional wall motion abnormalities. Left ventricular diastolic parameters are consistent with Grade I diastolic dysfunction (impaired relaxation).  2. Right ventricular systolic function is mildly reduced. The right ventricular size is normal. There is normal pulmonary artery systolic pressure.  3. The mitral valve is abnormal. No evidence of mitral valve regurgitation.  4. The aortic valve has been repaired/replaced. Aortic valve regurgitation is not visualized.  There is a 19 mm Edwards MagnaEase bioprosthetic valve present in the aortic position. Procedure Date: 08/14/2008. Aortic valve area, by VTI measures 0.94 cm. Aortic valve mean gradient measures 16.5 mmHg.  5. The inferior vena cava is normal in size with greater than 50% respiratory variability, suggesting right atrial pressure of 3 mmHg. Comparison(s): Changes from prior study are noted. 05/26/2017: LVEF 60-65%, mean AOV gradient 17 mmHg. FINDINGS  Left Ventricle: Left ventricular ejection fraction, by estimation, is 65 to 70%. The left ventricle has normal function. The left ventricle has no regional wall motion abnormalities. The left ventricular internal cavity size was small. There is no left ventricular hypertrophy. Left ventricular diastolic parameters are consistent with Grade I diastolic dysfunction (impaired relaxation). Indeterminate filling pressures. Right Ventricle: The right ventricular size is normal. No increase in right ventricular wall thickness. Right ventricular systolic function is mildly reduced. There is normal pulmonary artery systolic pressure. The tricuspid regurgitant velocity is 2.14 m/s, and with an assumed right atrial pressure of 3 mmHg, the estimated right ventricular systolic pressure is [id number] mmHg. Left Atrium: Left atrial size was normal in size. Right Atrium: Right atrial size was normal in size. Pericardium: There is no evidence of pericardial effusion. Mitral Valve: The mitral valve is abnormal. Mild to moderate mitral annular calcification. No evidence of mitral valve regurgitation. Tricuspid Valve: The tricuspid valve is grossly normal. Tricuspid valve regurgitation is mild. Aortic Valve: The aortic valve has been repaired/replaced. Aortic valve regurgitation is not visualized. Aortic  valve mean gradient measures 16.5 mmHg. Aortic valve peak gradient measures 24.5 mmHg. Aortic valve area, by VTI measures 0.94 cm. There is a  19 mm Edwards MagnaEase bioprosthetic valve present  in the aortic position. Procedure Date: 08/14/2008. Pulmonic Valve: The pulmonic valve was normal in structure. Pulmonic valve regurgitation is not visualized. Aorta: The aortic root and ascending aorta are structurally normal, with no evidence of dilitation. Venous: The inferior vena cava is normal in size with greater than 50% respiratory variability, suggesting right atrial pressure of 3 mmHg. IAS/Shunts: The interatrial septum is aneurysmal. No atrial level shunt detected by color flow Doppler.  LEFT VENTRICLE PLAX 2D LVIDd:         4.00 cm  Diastology LVIDs:         1.80 cm  LV e' medial:    6.08 cm/s LV PW:         1.00 cm  LV E/e' medial:  12.0 LV IVS:        1.10 cm  LV e' lateral:   8.27 cm/s LVOT diam:     2.10 cm  LV E/e' lateral: 8.8 LV SV:         61 LV SV Index:   38 LVOT Area:     3.46 cm  RIGHT VENTRICLE RV S prime:     8.68 cm/s TAPSE (M-mode): 1.1 cm LEFT ATRIUM           Index       RIGHT ATRIUM           Index LA diam:      3.20 cm 2.03 cm/m  RA Area:     12.60 cm LA Vol (A2C): 10.6 ml 6.72 ml/m  RA Volume:   31.70 ml  20.09 ml/m LA Vol (A4C): 42.0 ml 26.61 ml/m  AORTIC VALVE                    PULMONIC VALVE AV Area (Vmax):    0.92 cm     PV Vmax:       0.61 m/s AV Area (Vmean):   0.85 cm     PV Peak grad:  1.5 mmHg AV Area (VTI):     0.94 cm AV Vmax:           247.50 cm/s AV Vmean:          192.000 cm/s AV VTI:            0.644 m AV Peak Grad:      24.5 mmHg AV Mean Grad:      16.5 mmHg LVOT Vmax:         65.80 cm/s LVOT Vmean:        46.900 cm/s LVOT VTI:          0.175 m LVOT/AV VTI ratio: 0.27  AORTA Ao Root diam: 2.80 cm Ao Asc diam:  3.05 cm MITRAL VALVE               TRICUSPID VALVE MV Area (PHT): 4.15 cm    TR Peak grad:   18.3 mmHg MV Decel Time: 183 msec    TR Vmax:        214.00 cm/s MV E velocity: 72.80 cm/s MV A velocity: 82.00 cm/s  SHUNTS MV E/A ratio:  0.89        Systemic VTI:  0.18 m  Systemic Diam: 2.10 cm Lyman Bishop MD Electronically  signed by Lyman Bishop MD Signature Date/Time: 08/14/2020/11:31:48 AM    Final      LOS: 0 days   Antonieta Pert, MD [facility]  08/15/2020, 11:47 AM

## 2020-08-15 NOTE — Evaluation (Signed)
Physical Therapy Evaluation Patient Details Name: Sandra Sheppard MRN: [id number] DOB: [birth year] Today's Date: 08/15/2020   History of Present Illness  Pt adm 3/3 with near syncopal episode and falling in to the bushes when walking her dog. +orthostatics in ED. MRI and CT negative for acute changes. Pt with confusion. Pt with  PMH - AAA, ckd, dm, htn, pvd,paf, avr.  Clinical Impression  Pt presents to PT with poor balance, decr mobility, poor cognition, and orthostatic hypotension which make return home alone difficult. With standing for orthostatic BP's pt denied light headedness but stated her knees felt weak/swollen so kept ambulation distance short. Pt was almost constantly fidgeting with her gown and wires. Pt did not know she was in the actual hospital and told me she had been up walking and had been downstairs. She also couldn't tell me how many siblings she had. Feel she will need ST-SNF which she may ultimately refuse but she agreed with recommendation when I was in the room. Sister was present and we both encouraged her to consider ALF in the future.     Follow Up Recommendations SNF (if pt refuses maximize [facility] services)    Equipment Recommendations  Other (comment) (rollator)    Recommendations for Other Services       Precautions / Restrictions Precautions Precautions: Fall Restrictions Weight Bearing Restrictions: No      Mobility  Bed Mobility Overal bed mobility: Needs Assistance Bed Mobility: Supine to Sit     Supine to sit: Supervision;HOB elevated     General bed mobility comments: Incr time and supervision for lines as pt easily distracted by lines    Transfers Overall transfer level: Needs assistance Equipment used: 1 person hand held assist;4-wheeled walker Transfers: Sit to/from Stand Sit to Stand: Min assist         General transfer comment: Assist for balance due to pt with posterior bias and leaning posteriorly onto  bed.  Ambulation/Gait Ambulation/Gait assistance: Min assist Gait Distance (Feet): 60 Feet Assistive device: 4-wheeled walker Gait Pattern/deviations: Step-through pattern;Decreased stride length;Leaning posteriorly Gait velocity: decr Gait velocity interpretation: <1.31 ft/sec, indicative of household ambulator General Gait Details: Assist for balance and support. Limited gait distance due to orthostatic BP and I wasn't comfortable venuring too far from the room.  Stairs            Wheelchair Mobility    Modified Rankin (Stroke Patients Only)       Balance Overall balance assessment: Needs assistance Sitting-balance support: Single extremity supported;Bilateral upper extremity supported;No upper extremity supported;Feet supported Sitting balance-Leahy Scale: Poor Sitting balance - Comments: Pt sitting without UE support but with occasional loss of balance posteriorly Postural control: Posterior lean Standing balance support: Single extremity supported Standing balance-Leahy Scale: Poor Standing balance comment: Hand held min assist for static standing due to posterior lean                             Pertinent Vitals/Pain Pain Assessment: No/denies pain    Home Living Family/patient expects to be discharged to:: Private residence Living Arrangements: Alone   Type of Home: Apartment Home Access: Level entry     Home Layout: One level Home Equipment: Cane - single point      Prior Function Level of Independence: Independent         Comments: Has a cane but sister says she doesn't use     Hand Dominance   Dominant Hand: Left  Extremity/Trunk Assessment   Upper Extremity Assessment Upper Extremity Assessment: Defer to OT evaluation    Lower Extremity Assessment Lower Extremity Assessment: Generalized weakness       Communication   Communication: No difficulties  Cognition Arousal/Alertness: Awake/alert Behavior During Therapy:  Restless Overall Cognitive Status: Impaired/Different from baseline Area of Impairment: Orientation;Attention;Memory;Following commands;Safety/judgement;Awareness;Problem solving                 Orientation Level: Disoriented to;Place;Time;Situation Current Attention Level: Sustained Memory: Decreased short-term memory Following Commands: Follows one step commands consistently Safety/Judgement: Decreased awareness of safety;Decreased awareness of deficits Awareness: Emergent Problem Solving: Requires verbal cues;Requires tactile cues        General Comments      Exercises     Assessment/Plan    PT Assessment Patient needs continued PT services  PT Problem List Decreased strength;Decreased activity tolerance;Decreased balance;Decreased mobility;Decreased cognition;Decreased safety awareness       PT Treatment Interventions DME instruction;Gait training;Functional mobility training;Therapeutic activities;Therapeutic exercise;Cognitive remediation;Patient/family education    PT Goals (Current goals can be found in the Care Plan section)  Acute Rehab PT Goals Patient Stated Goal: go home PT Goal Formulation: With patient/family Time For Goal Achievement: 08/29/20 Potential to Achieve Goals: Good    Frequency Min 3X/week   Barriers to discharge Decreased caregiver support lives alone    Co-evaluation               AM-PAC PT "6 Clicks" Mobility  Outcome Measure Help needed turning from your back to your side while in a flat bed without using bedrails?: None Help needed moving from lying on your back to sitting on the side of a flat bed without using bedrails?: A Little Help needed moving to and from a bed to a chair (including a wheelchair)?: A Little Help needed standing up from a chair using your arms (e.g., wheelchair or bedside chair)?: A Little Help needed to walk in hospital room?: A Little Help needed climbing 3-5 steps with a railing? : A Little 6  Click Score: 19    End of Session Equipment Utilized During Treatment: Gait belt Activity Tolerance: Treatment limited secondary to medical complications (Comment) (orthostatic) Patient left: in chair;with call bell/phone within reach;with chair alarm set;with family/visitor present Nurse Communication: Mobility status PT Visit Diagnosis: Unsteadiness on feet (R26.81);Other abnormalities of gait and mobility (R26.89);Muscle weakness (generalized) (M62.81)    Time: [id number] PT Time Calculation (min) (ACUTE ONLY): 33 min   Charges:   PT Evaluation $PT Eval Moderate Complexity: 1 Mod PT Treatments $Gait Training: 8-22 mins        [place] Pager [phone] Office [place] 08/15/2020, 1:24 PM

## 2020-08-15 NOTE — Plan of Care (Signed)
  Problem: Clinical Measurements: Goal: Will remain free from infection Outcome: Progressing   Problem: Activity: Goal: Risk for activity intolerance will decrease Outcome: Progressing   Problem: Nutrition: Goal: Adequate nutrition will be maintained 08/15/2020 0222 by Cicero Duck, RN Outcome: Progressing   Problem: Pain Managment: Goal: General experience of comfort will improve Outcome: Progressing   Problem: Skin Integrity: Goal: Risk for impaired skin integrity will decrease Outcome: Progressing

## 2020-08-15 NOTE — Progress Notes (Signed)
Unable to complete admission documentation d/t pt's cognition.

## 2020-08-15 NOTE — Progress Notes (Signed)
Pt was asking for her rings, it was in her bag, sister states she will take it with her

## 2020-08-15 NOTE — Evaluation (Signed)
Occupational Therapy Evaluation Patient Details Name: Sandra Sheppard MRN: [id number] DOB: [birth year] Today's Date: 08/15/2020    History of Present Illness Pt adm 3/3 with near syncopal episode and falling in to the bushes when walking her dog. +orthostatics in ED. MRI and CT negative for acute changes. Pt with confusion. Pt with  PMH - AAA, ckd, dm, htn, pvd,paf, avr.   Clinical Impression   Patient admitted for the above diagnosis.  PTA, she states she was using a cane for mobility, drove, and needed no assist with ADL/IADL.  Patient presents with confusion, decreased cognition, and unsteadiness.  Unclear if she is an accurate historian.  Currently, she is needing up to [place] for balance, Min Guard for mobility and up to [employer] for ADL from a sit/stand level.  Patient will continue to be followed by OT in the acute setting to maximize her functional status, but SNF post acute is recommended.      Follow Up Recommendations  SNF;Supervision/Assistance - 24 hour    Equipment Recommendations  3 in 1 bedside commode    Recommendations for Other Services       Precautions / Restrictions Precautions Precautions: Fall Restrictions Weight Bearing Restrictions: No      Mobility Bed Mobility Overal bed mobility: Needs Assistance Bed Mobility: Supine to Sit;Sit to Supine     Supine to sit: Supervision Sit to supine: Supervision   General bed mobility comments: Incr time and supervision for lines as pt easily distracted by lines    Transfers Overall transfer level: Needs assistance Equipment used: 1 person hand held assist;Rolling walker (2 wheeled) Transfers: Sit to/from Stand Sit to Stand: Min assist         General transfer comment: Assist for balance due to pt with posterior bias and leaning posteriorly onto bed.    Balance Overall balance assessment: Needs assistance Sitting-balance support: No upper extremity supported;Feet supported Sitting balance-Leahy Scale:  Fair Sitting balance - Comments: Pt sitting without UE support but with occasional loss of balance posteriorly Postural control: Posterior lean Standing balance support: Single extremity supported;Bilateral upper extremity supported Standing balance-Leahy Scale: Poor Standing balance comment: Hand held min assist for static standing due to posterior lean                           ADL either performed or assessed with clinical judgement   ADL Overall ADL's : Needs assistance/impaired Eating/Feeding: Independent;Sitting   Grooming: Wash/dry hands;Wash/dry face;Min guard;Standing           Upper Body Dressing : Supervision/safety;Sitting   Lower Body Dressing: Min guard;Sit to/from stand Lower Body Dressing Details (indicate cue type and reason): unsteady on feet             Functional mobility during ADLs: Rolling walker;Min guard       Vision Baseline Vision/History: Wears glasses Patient Visual Report: No change from baseline       Perception     Praxis      Pertinent Vitals/Pain Pain Assessment: No/denies pain     Hand Dominance Left   Extremity/Trunk Assessment Upper Extremity Assessment Upper Extremity Assessment: Overall WFL for tasks assessed   Lower Extremity Assessment Lower Extremity Assessment: Defer to PT evaluation   Cervical / Trunk Assessment Cervical / Trunk Assessment: Normal   Communication Communication Communication: No difficulties   Cognition Arousal/Alertness: Awake/alert Behavior During Therapy: WFL for tasks assessed/performed Overall Cognitive Status: Impaired/Different from baseline Area of Impairment:  Orientation;Attention;Memory;Following commands;Safety/judgement;Awareness;Problem solving                 Orientation Level: Disoriented to;Place;Time;Situation Current Attention Level: Sustained Memory: Decreased short-term memory Following Commands: Follows one step commands  consistently Safety/Judgement: Decreased awareness of safety;Decreased awareness of deficits Awareness: Emergent Problem Solving: Requires verbal cues;Slow processing General Comments: patient is preoccupied with lines and leads.   General Comments       Exercises     Shoulder Instructions      Home Living Family/patient expects to be discharged to:: Private residence Living Arrangements: Alone Available Help at Discharge: Family;Available PRN/intermittently Type of Home: Apartment Home Access: Level entry     Home Layout: One level     Bathroom Shower/Tub: Teacher, early years/pre: Handicapped height     Home Equipment: Cane - single point          Prior Functioning/Environment Level of Independence: Independent        Comments: Has a cane but sister says she doesn't use        OT Problem List: Decreased strength;Impaired balance (sitting and/or standing);Decreased safety awareness;Decreased cognition      OT Treatment/Interventions: Self-care/ADL training;Therapeutic exercise;Cognitive remediation/compensation;Therapeutic activities    OT Goals(Current goals can be found in the care plan section) Acute Rehab OT Goals Patient Stated Goal: I'm ready to get ut of here OT Goal Formulation: With patient Time For Goal Achievement: 08/29/20 Potential to Achieve Goals: Good ADL Goals Pt Will Perform Grooming: with set-up;sitting;standing Pt Will Perform Lower Body Bathing: with set-up;sit to/from stand Pt Will Perform Lower Body Dressing: with set-up;sit to/from stand Pt Will Transfer to Toilet: with supervision;ambulating;regular height toilet  OT Frequency: Min 2X/week   Barriers to D/C:    none noted       Co-evaluation              AM-PAC OT "6 Clicks" Daily Activity     Outcome Measure Help from another person eating meals?: None Help from another person taking care of personal grooming?: A Little Help from another person toileting,  which includes using toliet, bedpan, or urinal?: A Little Help from another person bathing (including washing, rinsing, drying)?: A Little Help from another person to put on and taking off regular upper body clothing?: A Little Help from another person to put on and taking off regular lower body clothing?: A Little 6 Click Score: 19   End of Session Equipment Utilized During Treatment: Gait belt;Rolling walker  Activity Tolerance: Patient tolerated treatment well Patient left: in bed;with call bell/phone within reach;with bed alarm set;with family/visitor present  OT Visit Diagnosis: Unsteadiness on feet (R26.81);Other symptoms and signs involving cognitive function;History of falling (Z91.81)                Time: [id number] OT Time Calculation (min): 17 min Charges:  OT General Charges $OT Visit: 1 Visit OT Evaluation $OT Eval Moderate Complexity: 1 Mod  08/15/2020  Rich, OTR/L  Acute Rehabilitation Services  Office:  [phone]   Metta Clines 08/15/2020, 1:58 PM

## 2020-08-16 DIAGNOSIS — R55 Syncope and collapse: Secondary | ICD-10-CM | POA: Diagnosis not present

## 2020-08-16 LAB — BASIC METABOLIC PANEL
Anion gap: 10 (ref 5–15)
BUN: 25 mg/dL — ABNORMAL HIGH (ref 8–23)
CO2: 20 mmol/L — ABNORMAL LOW (ref 22–32)
Calcium: 9.1 mg/dL (ref 8.9–10.3)
Chloride: 102 mmol/L (ref 98–111)
Creatinine, Ser: 2.09 mg/dL — ABNORMAL HIGH (ref 0.44–1.00)
GFR, Estimated: 24 mL/min — ABNORMAL LOW (ref >60)
Glucose, Bld: 160 mg/dL — ABNORMAL HIGH (ref 70–99)
Potassium: 4.6 mmol/L (ref 3.5–5.1)
Sodium: 132 mmol/L — ABNORMAL LOW (ref 135–145)

## 2020-08-16 LAB — HEMOGLOBIN A1C
Hgb A1c MFr Bld: 7.1 % — ABNORMAL HIGH (ref 4.8–5.6)
Mean Plasma Glucose: 157.07 mg/dL

## 2020-08-16 LAB — CBC
HCT: 36 % (ref 36.0–46.0)
Hemoglobin: 12.6 g/dL (ref 12.0–15.0)
MCH: 29.6 pg (ref 26.0–34.0)
MCHC: 35 g/dL (ref 30.0–36.0)
MCV: 84.7 fL (ref 80.0–100.0)
Platelets: 196 10*3/uL (ref 150–400)
RBC: 4.25 MIL/uL (ref 3.87–5.11)
RDW: 13.2 % (ref 11.5–15.5)
WBC: 6.3 10*3/uL (ref 4.0–10.5)
nRBC: 0 % (ref 0.0–0.2)

## 2020-08-16 LAB — GLUCOSE, CAPILLARY: Glucose-Capillary: 160 mg/dL — ABNORMAL HIGH (ref 70–99)

## 2020-08-16 MED ORDER — HYDRALAZINE HCL 10 MG PO TABS: 10.0000 mg | ORAL_TABLET | Freq: Three times a day (TID) | ORAL | Status: DC | PRN

## 2020-08-16 MED ORDER — NICOTINE 21 MG/24HR TD PT24
21.0000 mg | MEDICATED_PATCH | Freq: Every day | TRANSDERMAL | Status: DC
  Administered 2020-08-16 – 2020-09-01 (×17): 21 mg via TRANSDERMAL
  Filled 2020-08-16 (×17): qty 1

## 2020-08-16 NOTE — TOC Initial Note (Signed)
Transition of Care [facility]) - Initial/Assessment Note    Patient Details  Name: Sandra Sheppard MRN: [id number] Date of Birth: September 21, 1945  [facility] CM/SW Contact:    Trula Ore, [place] Phone Number: 08/16/2020, 2:02 PM  Clinical Narrative:                  CSW received consult for possible SNF placement at time of discharge. CSW spoke with patients sister Anne Ng regarding PT recommendation of SNF placement at time of discharge. Patients sister reports patient comes from home alone.  Patient expressed understanding of PT recommendation and is agreeable to SNF placement at time of discharge. Patients sister gave CSW permission to fax out initial referral near [place] area. Patient has received the COVID vaccines as well as the booster.Patients sister gave permission for CSW to call patients sister [name] with SNF bed offers. Patient expressed being hopeful for rehab and to feel better soon. No further questions reported at this time. CSW to continue to follow and assist with discharge planning needs.   Expected Discharge Plan: Skilled Nursing Facility Barriers to Discharge: Continued Medical Work up   Patient Goals and CMS Choice   CMS Medicare.gov Compare Post Acute Care list provided to:: Patient Represenative (must comment) (sister Anne Ng) Choice offered to / list presented to : Sibling (Sister Patent examiner)  Expected Discharge Plan and Services Expected Discharge Plan: [place] In-house Referral: Clinical Social Work     Living arrangements for the past 2 months: Single Family Home                                      Prior Living Arrangements/Services Living arrangements for the past 2 months: Single Family Home Lives with:: Self Patient language and need for interpreter reviewed:: Yes Do you feel safe going back to the place where you live?: No   SNF  Need for Family Participation in Patient Care: Yes (Comment) Care giver support  system in place?: Yes (comment)   Criminal Activity/Legal Involvement Pertinent to Current Situation/Hospitalization: No - Comment as needed  Activities of Daily Living Home Assistive Devices/Equipment: Walker (specify type) ADL Screening (condition at time of admission) Patient's cognitive ability adequate to safely complete daily activities?: No (at this time) Is the patient deaf or have difficulty hearing?: No Does the patient have difficulty seeing, even when wearing glasses/contacts?: No Does the patient have difficulty concentrating, remembering, or making decisions?: Yes Patient able to express need for assistance with ADLs?: Yes (at this time) Does the patient have difficulty dressing or bathing?: Yes (at this time) Independently performs ADLs?: No (at this time) Does the patient have difficulty walking or climbing stairs?: No Weakness of Legs: Both Weakness of Arms/Hands: None  Permission Sought/Granted Permission sought to share information with : Case Manager,Family Chief Financial Officer Permission granted to share information with : Yes, Verbal Permission Granted  Share Information with NAME: [name]  Permission granted to share info w AGENCY: SNF  Permission granted to share info w Relationship: sister  Permission granted to share info w Contact Information: [name] [phone]  Emotional Assessment       Orientation: : Oriented to Situation,Oriented to Place,Oriented to Self Alcohol / Substance Use: Not Applicable Psych Involvement: No (comment)  Admission diagnosis:  Syncope and collapse [R55] Delirium [R41.0] Patient Active Problem List   Diagnosis Date Noted  . Near syncope 08/14/2020  . CKD (  chronic kidney disease) stage 4, GFR 15-29 ml/min (HCC) 08/13/2020  . Tobacco abuse 09/22/2019  . Educated about COVID-19 virus infection 09/22/2019  . Asymptomatic bilateral carotid artery stenosis 09/22/2019  . Acute renal failure with acute tubular  necrosis superimposed on stage 3 chronic kidney disease ([place])   . Palliative care encounter   . Hypocalcemia   . FTT (failure to thrive) in adult   . AKI (acute kidney injury) ([place]) 08/08/2018  . Hyperkalemia 08/08/2018  . Metabolic acidosis, increased anion gap 08/08/2018  . Normocytic anemia 08/08/2018  . Diabetes mellitus type II, non insulin dependent ([place]) 08/08/2018  . Bilateral carotid artery disease ([place]) 07/12/2018  . Dyslipidemia 07/12/2018  . Vomiting 07/04/2016  . Gastroenteritis 07/04/2016  . CKD (chronic kidney disease) stage 3, GFR 30-59 ml/min (HCC) 07/04/2016  . Occlusion and stenosis of carotid artery 04/28/2010  . ABDOMINAL AORTIC ANEURYSM 04/28/2010  . Peripheral vascular disease ([place]) 12/08/2008  . STENOSIS, MITRAL AND AORTIC VALVES 10/07/2008  . Paroxysmal atrial fibrillation ([place]) 10/07/2008  . H/O aortic valve replacement 08/14/2008  . HYPERCHOLESTEROLEMIA  IIA 06/23/2008  . HYPERTENSION, MILD 06/23/2008  . MURMUR 06/23/2008   PCP:  Prince Solian, MD Pharmacy:   [facility] [address] [place] - [address] [place] [place] [address] Phone: [phone] Fax: [phone]     Social Determinants of Health (SDOH) Interventions    Readmission Risk Interventions No flowsheet data found.

## 2020-08-16 NOTE — NC FL2 (Signed)
[place] LEVEL OF CARE SCREENING TOOL     IDENTIFICATION  Patient Name: Sandra Sheppard Birthdate: 1946/01/23 Sex: female Admission Date (Current Location): 08/13/2020  [facility] and [place] Number:  Herbalist and Address:  [facility], [place] [address], [place], [place] [address]      Provider Number: [id number]  Attending Physician Name and Address:  [place] Eric J, DO  Relative Name and Phone Number:  [name] [phone]    Current Level of Care: Hospital Recommended Level of Care: [place] Prior Approval Number:    Date Approved/Denied:   PASRR Number: [id number]  Discharge Plan: SNF    Current Diagnoses: Patient Active Problem List   Diagnosis Date Noted  . Near syncope 08/14/2020  . CKD (chronic kidney disease) stage 4, GFR 15-29 ml/min (HCC) 08/13/2020  . Tobacco abuse 09/22/2019  . Educated about COVID-19 virus infection 09/22/2019  . Asymptomatic bilateral carotid artery stenosis 09/22/2019  . Acute renal failure with acute tubular necrosis superimposed on stage 3 chronic kidney disease ([place])   . Palliative care encounter   . Hypocalcemia   . FTT (failure to thrive) in adult   . AKI (acute kidney injury) ([place]) 08/08/2018  . Hyperkalemia 08/08/2018  . Metabolic acidosis, increased anion gap 08/08/2018  . Normocytic anemia 08/08/2018  . Diabetes mellitus type II, non insulin dependent ([place]) 08/08/2018  . Bilateral carotid artery disease ([place]) 07/12/2018  . Dyslipidemia 07/12/2018  . Vomiting 07/04/2016  . Gastroenteritis 07/04/2016  . CKD (chronic kidney disease) stage 3, GFR 30-59 ml/min (HCC) 07/04/2016  . Occlusion and stenosis of carotid artery 04/28/2010  . ABDOMINAL AORTIC ANEURYSM 04/28/2010  . Peripheral vascular disease ([place]) 12/08/2008  . STENOSIS, MITRAL AND AORTIC VALVES 10/07/2008  . Paroxysmal atrial fibrillation ([place]) 10/07/2008  . H/O aortic valve replacement 08/14/2008  .  HYPERCHOLESTEROLEMIA  IIA 06/23/2008  . HYPERTENSION, MILD 06/23/2008  . MURMUR 06/23/2008    Orientation RESPIRATION BLADDER Height & Weight     Self,Situation,Place  Normal Incontinent,External catheter (External Urinary Catheter) Weight: 112 lb 6.4 oz (51 kg) Height:  5' 5.5" (166.4 cm)  BEHAVIORAL SYMPTOMS/MOOD NEUROLOGICAL BOWEL NUTRITION STATUS      Continent Diet (See Discharge Summary)  AMBULATORY STATUS COMMUNICATION OF NEEDS Skin   Limited Assist Verbally Skin abrasions (Abrasion,arm.Right,Foam)                       Personal Care Assistance Level of Assistance  Bathing,Feeding,Dressing Bathing Assistance: Limited assistance Feeding assistance: Independent (able to feed self) Dressing Assistance: Limited assistance     Functional Limitations Info  Sight,Hearing,Speech Sight Info: Impaired Hearing Info: Adequate Speech Info: Adequate    SPECIAL CARE FACTORS FREQUENCY  PT (By licensed PT),OT (By licensed OT)     PT Frequency: 5x min weekly OT Frequency: 5x min weekly            Contractures Contractures Info: Not present    Additional Factors Info  Code Status,Allergies,Psychotropic Code Status Info: FULL Allergies Info: Ativan (lorazepam),Codeine,Sulfonamide Derivatives,Penicillins Psychotropic Info: escitalopram (LEXAPRO) tablet 5 mg daily         Current Medications (08/16/2020):  This is the current hospital active medication list Current Facility-Administered Medications  Medication Dose Route Frequency Provider Last Rate Last Admin  . acetaminophen (TYLENOL) tablet 650 mg  650 mg Oral Q6H PRN Reubin Milan, MD   650 mg at 08/15/20 2023   Or  . acetaminophen (TYLENOL) suppository 650  mg  650 mg Rectal Q6H PRN Reubin Milan, MD      . amLODipine (NORVASC) tablet 5 mg  5 mg Oral Daily Antonieta Pert, MD   5 mg at 08/16/20 0920  . aspirin EC tablet 81 mg  81 mg Oral Daily Kc, Ramesh, MD   81 mg at 08/16/20 0920  . bisacodyl (DULCOLAX)  suppository 10 mg  10 mg Rectal Daily PRN Kc, Maren Beach, MD      . cholecalciferol (VITAMIN D3) tablet 2,000 Units  2,000 Units Oral Daily Antonieta Pert, MD   2,000 Units at 08/16/20 0920  . docusate sodium (COLACE) capsule 100 mg  100 mg Oral BID Kc, Ramesh, MD   100 mg at 08/16/20 0920  . enoxaparin (LOVENOX) injection 30 mg  30 mg Subcutaneous QHS Reubin Milan, MD   30 mg at 08/15/20 2023  . escitalopram (LEXAPRO) tablet 5 mg  5 mg Oral Daily Kc, Ramesh, MD   5 mg at 08/16/20 0920  . hydrALAZINE (APRESOLINE) tablet 10 mg  10 mg Oral Q8H PRN [place] Eric J, DO      . loratadine (CLARITIN) tablet 10 mg  10 mg Oral Daily PRN Kc, Maren Beach, MD      . LORazepam (ATIVAN) injection 1 mg  1 mg Intravenous Q5 Min x 2 PRN Charlesetta Shanks, MD   1 mg at 08/13/20 2130  . multivitamin with minerals tablet 1 tablet  1 tablet Oral Daily Antonieta Pert, MD   1 tablet at 08/16/20 0920  . nicotine (NICODERM CQ - dosed in mg/24 hours) patch 21 mg  21 mg Transdermal Daily [place] Donnamarie Poag, DO   21 mg at 08/16/20 1205  . ondansetron (ZOFRAN) tablet 4 mg  4 mg Oral Q6H PRN Reubin Milan, MD       Or  . ondansetron [facility]) injection 4 mg  4 mg Intravenous Q6H PRN Reubin Milan, MD      . polyethylene glycol (MIRALAX / Floria Raveling) packet 17 g  17 g Oral Daily Antonieta Pert, MD   17 g at 08/16/20 [id number]  . pravastatin (PRAVACHOL) tablet 20 mg  20 mg Oral Daily Kc, Ramesh, MD   20 mg at 08/16/20 0921  . senna-docusate (Senokot-S) tablet 1 tablet  1 tablet Oral Cloretta Ned, MD   1 tablet at 08/15/20 2023  . sodium chloride flush (NS) 0.9 % injection 3 mL  3 mL Intravenous Q12H Reubin Milan, MD   3 mL at 08/16/20 0924  . vitamin B-12 (CYANOCOBALAMIN) tablet 1,000 mcg  1,000 mcg Oral Daily Antonieta Pert, MD   1,000 mcg at 08/16/20 0920     Discharge Medications: Please see discharge summary for a list of discharge medications.  Relevant Imaging Results:  Relevant Lab Results:   Additional  Information [phone]  Trula Ore, LCSWA

## 2020-08-16 NOTE — Progress Notes (Signed)
PROGRESS NOTE    Sandra Sheppard  [id number] DOB: 03-14-1946 DOA: 08/13/2020 PCP: Prince Solian, MD    Brief Narrative:  Sandra Sheppard is a 75 year old female with past medical history significant for AAA, paroxysmal atrial fibrillation, carotid artery stenosis, aortic/mitral valve stenosis, CKD stage IV, type 2 diabetes mellitus, hyperlipidemia, essential hypertension, peripheral vascular disease, osteoporosis who presented to the ED following presyncopal episode. Patient reported being lightheaded, off balance and nearly passing out while walking her dog in her neighborhood on 08/13/2020. Patient was assisted to her feet by a neighbor and EMS was activated and subsequently taken to the ED. Patient denied any chest pain, shortness of breath, palpitations, diaphoresis. No lower extremity edema or about bladder/bowel incontinence. No reports of seizure-like activity.  In the ED, vital signs were stable on room air. Sodium 133, potassium 4.0, chloride 105, CO2 17, glucose 23, BUN 23, creatinine 2.54, AST 14, ALT 8. WBC 6.3, hemoglobin 11.9, platelets 174. High-sensitivity troponin 9>10. INR 1.1. Urinalysis unrevealing. EtOH level less than 10. Chest x-ray with no active cardiopulmonary disease process.   Assessment & Plan:   Principal Problem:   Near syncope Active Problems:   Paroxysmal atrial fibrillation (HCC)   Dyslipidemia   Normocytic anemia   Diabetes mellitus type II, non insulin dependent (HCC)   CKD (chronic kidney disease) stage 4, GFR 15-29 ml/min (HCC)   Near syncope Patient presenting to the ED following presyncopal episode while walking her dog in which she fell.  No loss of consciousness, no seizure-like activity reported.  CT head/C-spine without contrast with no acute intracranial abnormality, soft tissue contusion and hematoma right forehead, no fracture or static subluxation of the cervical spine; advanced small vessel white matter disease, nonacute lacunar infarctions  right caudate head and lentiform nuclei.  Tress, limited due to motion artifact but no acute intracranial abnormality, age-related cerebral atrophy with moderate/advanced chronic microcytic vascular ischemic disease with remote lacunar infarcts right basal ganglia/left cerebellum.  TTE with LVEF [id number], grade 1 diastolic dysfunction, noted aortic bioprosthetic aortic valve, IVC normal in size.  TSH 2.709, within normal limits.  EtOH level less than 10.  Urinalysis unrevealing.  Afebrile without leukocytosis, unlikely infectious etiology.  Patient was noted to be orthostatic which is likely etiology to her near syncopal episode. --Restarted home antihypertensives at a reduced dose --Encourage increased oral intake --Continue to monitor closely, PT/OT  Acute renal failure on CKD stage IV: Resolved Creatinine 2.54 on admission.  Suspect prerenal azotemia secondary to dehydration.  Patient received IV fluid hydration with improvement of creatinine. (Baseline Cr 2.3) --Cr 2.54>1.98>2.09 --Avoid nephrotoxins, renal dose all medications --Follow BMP daily  Paroxysmal atrial fibrillation Not on rate controlling medication or anticoagulant outpatient.  Likely poor candidate for anticoagulation due to fall risk.  EKG on arrival with normal sinus rhythm. --no arrhythmia noted on telemetry; dc tele  Essential hypertension Home regimen includes amlodipine '10mg'$  daily.  Patient was noted to be orthostatic on admission. --Restart amlodipine '5mg'$  PO daily; lower dose than home --Hydralazine 10 mg p.o. q8h prn SBP >170 or DBP >110 --continue to monitor BP closely  Type 2 diabetes mellitus Hemoglobin A1C 6.5 08/20/2018. --Repeat Hgb A1c --SSI for coverage --CBGs qAC/HS  Hx CVA, ischemic --Continue aspirin and statin  Hyperlipidemia: pravastatin '20mg'$  PO daily  Anxiety/depression --Lexapro '5mg'$  PO daily  Moderate protein calorie malnutrition Body mass index is 18.42 kg/m.  --Nutrition consult  Tobacco  use disorder Family reports she smokes roughly 4 packs/day.  Counseled on need for  cessation. --Nicotine patch  Cognitive impairment Cerebral atrophy noted on imaging study with prior history of CVA and microvascular ischemic changes.  Family reports patient does not eat well at home and with increasing confusion.  Also reported not taking her medications accordingly as prescribed with missing several doses. --Pending SNF placement, likely would benefit from SNF/ALF/long-term care following acute rehabilitation  Aortic atherosclerosis --NU aspirin and statin   DVT prophylaxis: Lovenox   Code Status: Full Code Family Communication: updated sisters present at bedside.   Disposition Plan:  Level of care: Telemetry Medical Status is: Observation  The patient remains OBS appropriate and will d/c before 2 midnights.  Dispo: The patient is from: Home              Anticipated d/c is to: SNF              Patient currently is medically stable to d/c.   Difficult to place patient No   Consultants:   none  Procedures:   TEE  Antimicrobials:   none   Subjective: Patient seen examined bedside, resting comfortably.  No specific complaints this morning other than continued weakness/fatigue.  Nuys headache, no visual changes, no chest pain, palpitations, no shortness of breath, no abdominal pain, no weakness, no fatigue, no paresthesias.  No acute events overnight per nursing staff.  Objective: Vitals:   08/15/20 1937 08/15/20 2013 08/16/20 0501 08/16/20 0735  BP: (!) 144/95 (!) 144/97 (!) 192/76 (!) 164/67  Pulse: 69 60 60 61  Resp: '20 20 19 12  '$ Temp: 97.7 F (36.5 C) 98 F (36.7 C) 97.6 F (36.4 C) 98 F (36.7 C)  TempSrc: Oral Oral Oral Oral  SpO2: 99% 96% 99%   Weight:   51 kg   Height:        Intake/Output Summary (Last 24 hours) at 08/16/2020 1049 Last data filed at 08/16/2020 0900 Gross per 24 hour  Intake 1900.81 ml  Output 570 ml  Net 1330.81 ml   Filed Weights    08/13/20 1625 08/16/20 0501  Weight: 52.6 kg 51 kg    Examination:  General exam: Appears calm and comfortable, thin in appearance with muscle wasting Respiratory system: Clear to auscultation. Respiratory effort normal.  Oxygenating well on room air Cardiovascular system: S1 & S2 heard, RRR. No JVD, murmurs, rubs, gallops or clicks. No pedal edema. Gastrointestinal system: Abdomen is nondistended, soft and nontender. No organomegaly or masses felt. Normal bowel sounds heard. Central nervous system: Alert and oriented. No focal neurological deficits. Extremities: Symmetric 5 x 5 power. Skin: No rashes, lesions or ulcers Psychiatry: Judgement and insight appear poor. Mood & affect appropriate.     Data Reviewed: I have personally reviewed following labs and imaging studies  CBC: Recent Labs  Lab 08/13/20 1640 08/14/20 0235 08/16/20 0646  WBC 6.3 8.8 6.3  NEUTROABS 4.2  --   --   HGB 11.9* 12.8 12.6  HCT 36.8 39.9 36.0  MCV 89.3 88.5 84.7  PLT 174 180 [id number]   Basic Metabolic Panel: Recent Labs  Lab 08/13/20 1640 08/14/20 0235 08/15/20 0804 08/16/20 0646  NA 133* 136 135 132*  K 4.0 3.6 4.4 4.6  CL 105 104 104 102  CO2 17* 22 22 20*  GLUCOSE 183* 161* 143* 160*  BUN '23 21 18 '$ 25*  CREATININE 2.54* 2.09* 1.98* 2.09*  CALCIUM 8.6* 8.8* 9.2 9.1   GFR: Estimated Creatinine Clearance: 18.7 mL/min (A) (by C-G formula based on SCr of 2.09 mg/dL (H)). Liver  Function Tests: Recent Labs  Lab 08/13/20 1640  AST 14*  ALT 8  ALKPHOS 84  BILITOT 0.6  PROT 6.5  ALBUMIN 3.4*   No results for input(s): LIPASE, AMYLASE in the last 168 hours. Recent Labs  Lab 08/14/20 1603  AMMONIA 11   Coagulation Profile: Recent Labs  Lab 08/13/20 1640  INR 1.1   Cardiac Enzymes: No results for input(s): CKTOTAL, CKMB, CKMBINDEX, TROPONINI in the last 168 hours. BNP (last 3 results) No results for input(s): PROBNP in the last 8760 hours. HbA1C: No results for input(s): HGBA1C  in the last 72 hours. CBG: Recent Labs  Lab 08/13/20 1633 08/14/20 0455 08/15/20 0620 08/16/20 0605  GLUCAP 172* 148* 151* 160*   Lipid Profile: No results for input(s): CHOL, HDL, LDLCALC, TRIG, CHOLHDL, LDLDIRECT in the last 72 hours. Thyroid Function Tests: Recent Labs    08/14/20 1603  TSH 2.709   Anemia Panel: Recent Labs    08/14/20 1603  VITAMINB12 937*   Sepsis Labs: No results for input(s): PROCALCITON, LATICACIDVEN in the last 168 hours.  Recent Results (from the past 240 hour(s))  SARS CORONAVIRUS 2 (TAT 6-24 HRS) Nasopharyngeal Nasopharyngeal Swab     Status: None   Collection Time: 08/14/20  8:49 AM   Specimen: Nasopharyngeal Swab  Result Value Ref Range Status   SARS Coronavirus 2 NEGATIVE NEGATIVE Final    Comment: (NOTE) SARS-CoV-2 target nucleic acids are NOT DETECTED.  The SARS-CoV-2 RNA is generally detectable in upper and lower respiratory specimens during the acute phase of infection. Negative results do not preclude SARS-CoV-2 infection, do not rule out co-infections with other pathogens, and should not be used as the sole basis for treatment or other patient management decisions. Negative results must be combined with clinical observations, patient history, and epidemiological information. The expected result is Negative.  Fact Sheet for Patients: SugarRoll.be  Fact Sheet for Healthcare Providers: https://www.woods-mathews.com/  This test is not yet approved or cleared by the [place] FDA and  has been authorized for detection and/or diagnosis of SARS-CoV-2 by FDA under an Emergency Use Authorization (EUA). This EUA will remain  in effect (meaning this test can be used) for the duration of the COVID-19 declaration under Se ction 564(b)(1) of the Act, 21 U.S.C. section 360bbb-3(b)(1), unless the authorization is terminated or revoked sooner.  Performed at [facility], [place] [address] [place], [place] [address]          Radiology Studies: No results found.      Scheduled Meds: . amLODipine  5 mg Oral Daily  . aspirin EC  81 mg Oral Daily  . cholecalciferol  2,000 Units Oral Daily  . docusate sodium  100 mg Oral BID  . enoxaparin (LOVENOX) injection  30 mg Subcutaneous QHS  . escitalopram  5 mg Oral Daily  . multivitamin with minerals  1 tablet Oral Daily  . polyethylene glycol  17 g Oral Daily  . pravastatin  20 mg Oral Daily  . senna-docusate  1 tablet Oral QHS  . sodium chloride flush  3 mL Intravenous Q12H  . cyanocobalamin  1,000 mcg Oral Daily   Continuous Infusions:   LOS: 0 days    Time spent: 39 minutes spent on chart review, discussion with nursing staff, consultants, updating family and interview/physical exam; more than 50% of that time was spent in counseling and/or coordination of care.    Eric J [place] DO [facility] Available via Epic secure chat 7am-7pm After  these hours, please refer to coverage provider listed on [email or web address] 08/16/2020, 10:49 AM

## 2020-08-17 DIAGNOSIS — E785 Hyperlipidemia, unspecified: Secondary | ICD-10-CM | POA: Diagnosis not present

## 2020-08-17 DIAGNOSIS — D649 Anemia, unspecified: Secondary | ICD-10-CM | POA: Diagnosis not present

## 2020-08-17 DIAGNOSIS — R410 Disorientation, unspecified: Secondary | ICD-10-CM | POA: Diagnosis not present

## 2020-08-17 DIAGNOSIS — G9341 Metabolic encephalopathy: Secondary | ICD-10-CM | POA: Diagnosis not present

## 2020-08-17 DIAGNOSIS — I129 Hypertensive chronic kidney disease with stage 1 through stage 4 chronic kidney disease, or unspecified chronic kidney disease: Secondary | ICD-10-CM | POA: Diagnosis present

## 2020-08-17 DIAGNOSIS — I480 Paroxysmal atrial fibrillation: Secondary | ICD-10-CM | POA: Diagnosis not present

## 2020-08-17 DIAGNOSIS — E1122 Type 2 diabetes mellitus with diabetic chronic kidney disease: Secondary | ICD-10-CM | POA: Diagnosis present

## 2020-08-17 DIAGNOSIS — E119 Type 2 diabetes mellitus without complications: Secondary | ICD-10-CM | POA: Diagnosis not present

## 2020-08-17 DIAGNOSIS — E871 Hypo-osmolality and hyponatremia: Secondary | ICD-10-CM | POA: Diagnosis not present

## 2020-08-17 DIAGNOSIS — R69 Illness, unspecified: Secondary | ICD-10-CM | POA: Diagnosis not present

## 2020-08-17 DIAGNOSIS — E43 Unspecified severe protein-calorie malnutrition: Secondary | ICD-10-CM | POA: Diagnosis not present

## 2020-08-17 DIAGNOSIS — N184 Chronic kidney disease, stage 4 (severe): Secondary | ICD-10-CM | POA: Diagnosis not present

## 2020-08-17 DIAGNOSIS — F32A Depression, unspecified: Secondary | ICD-10-CM | POA: Diagnosis present

## 2020-08-17 DIAGNOSIS — R42 Dizziness and giddiness: Secondary | ICD-10-CM | POA: Diagnosis not present

## 2020-08-17 DIAGNOSIS — F17210 Nicotine dependence, cigarettes, uncomplicated: Secondary | ICD-10-CM | POA: Diagnosis present

## 2020-08-17 DIAGNOSIS — F0390 Unspecified dementia without behavioral disturbance: Secondary | ICD-10-CM | POA: Diagnosis present

## 2020-08-17 DIAGNOSIS — Z20822 Contact with and (suspected) exposure to covid-19: Secondary | ICD-10-CM | POA: Diagnosis not present

## 2020-08-17 DIAGNOSIS — I700 Atherosclerosis of aorta: Secondary | ICD-10-CM | POA: Diagnosis present

## 2020-08-17 DIAGNOSIS — Z681 Body mass index (BMI) 19 or less, adult: Secondary | ICD-10-CM | POA: Diagnosis not present

## 2020-08-17 DIAGNOSIS — N179 Acute kidney failure, unspecified: Secondary | ICD-10-CM | POA: Diagnosis not present

## 2020-08-17 DIAGNOSIS — I951 Orthostatic hypotension: Secondary | ICD-10-CM | POA: Diagnosis not present

## 2020-08-17 DIAGNOSIS — Z79899 Other long term (current) drug therapy: Secondary | ICD-10-CM | POA: Diagnosis not present

## 2020-08-17 DIAGNOSIS — R296 Repeated falls: Secondary | ICD-10-CM | POA: Diagnosis present

## 2020-08-17 DIAGNOSIS — F419 Anxiety disorder, unspecified: Secondary | ICD-10-CM | POA: Diagnosis present

## 2020-08-17 DIAGNOSIS — R55 Syncope and collapse: Secondary | ICD-10-CM | POA: Diagnosis not present

## 2020-08-17 DIAGNOSIS — I081 Rheumatic disorders of both mitral and tricuspid valves: Secondary | ICD-10-CM | POA: Diagnosis present

## 2020-08-17 DIAGNOSIS — E1151 Type 2 diabetes mellitus with diabetic peripheral angiopathy without gangrene: Secondary | ICD-10-CM | POA: Diagnosis present

## 2020-08-17 DIAGNOSIS — F05 Delirium due to known physiological condition: Secondary | ICD-10-CM | POA: Diagnosis present

## 2020-08-17 DIAGNOSIS — Z8673 Personal history of transient ischemic attack (TIA), and cerebral infarction without residual deficits: Secondary | ICD-10-CM | POA: Diagnosis not present

## 2020-08-17 LAB — BASIC METABOLIC PANEL
Anion gap: 10 (ref 5–15)
BUN: 31 mg/dL — ABNORMAL HIGH (ref 8–23)
CO2: 20 mmol/L — ABNORMAL LOW (ref 22–32)
Calcium: 9 mg/dL (ref 8.9–10.3)
Chloride: 101 mmol/L (ref 98–111)
Creatinine, Ser: 2.55 mg/dL — ABNORMAL HIGH (ref 0.44–1.00)
GFR, Estimated: 19 mL/min — ABNORMAL LOW (ref >60)
Glucose, Bld: 158 mg/dL — ABNORMAL HIGH (ref 70–99)
Potassium: 4.8 mmol/L (ref 3.5–5.1)
Sodium: 131 mmol/L — ABNORMAL LOW (ref 135–145)

## 2020-08-17 LAB — SARS CORONAVIRUS 2 (TAT 6-24 HRS): SARS Coronavirus 2: NEGATIVE

## 2020-08-17 MED ORDER — ENSURE ENLIVE PO LIQD
237.0000 mL | Freq: Three times a day (TID) | ORAL | Status: DC
  Administered 2020-08-18 – 2020-08-20 (×4): 237 mL via ORAL
  Administered 2020-08-20: 1 mL via ORAL
  Administered 2020-08-20 – 2020-09-01 (×25): 237 mL via ORAL

## 2020-08-17 NOTE — Progress Notes (Signed)
PROGRESS NOTE    Sandra Sheppard  [id number] DOB: 02-08-1946 DOA: 08/13/2020 PCP: Prince Solian, MD    Brief Narrative:  Sandra Sheppard is a 75 year old female with past medical history significant for AAA, paroxysmal atrial fibrillation, carotid artery stenosis, aortic/mitral valve stenosis, CKD stage IV, type 2 diabetes mellitus, hyperlipidemia, essential hypertension, peripheral vascular disease, osteoporosis who presented to the ED following presyncopal episode. Patient reported being lightheaded, off balance and nearly passing out while walking her dog in her neighborhood on 08/13/2020. Patient was assisted to her feet by a neighbor and EMS was activated and subsequently taken to the ED. Patient denied any chest pain, shortness of breath, palpitations, diaphoresis. No lower extremity edema or about bladder/bowel incontinence. No reports of seizure-like activity.  In the ED, vital signs were stable on room air. Sodium 133, potassium 4.0, chloride 105, CO2 17, glucose 23, BUN 23, creatinine 2.54, AST 14, ALT 8. WBC 6.3, hemoglobin 11.9, platelets 174. High-sensitivity troponin 9>10. INR 1.1. Urinalysis unrevealing. EtOH level less than 10. Chest x-ray with no active cardiopulmonary disease process.   Assessment & Plan:   Principal Problem:   Near syncope Active Problems:   Paroxysmal atrial fibrillation (HCC)   Dyslipidemia   Normocytic anemia   Diabetes mellitus type II, non insulin dependent (HCC)   CKD (chronic kidney disease) stage 4, GFR 15-29 ml/min (HCC)   Near syncope Patient presenting to the ED following presyncopal episode while walking her dog in which she fell.  No loss of consciousness, no seizure-like activity reported.  CT head/C-spine without contrast with no acute intracranial abnormality, soft tissue contusion and hematoma right forehead, no fracture or static subluxation of the cervical spine; advanced small vessel white matter disease, nonacute lacunar infarctions  right caudate head and lentiform nuclei.  Tress, limited due to motion artifact but no acute intracranial abnormality, age-related cerebral atrophy with moderate/advanced chronic microcytic vascular ischemic disease with remote lacunar infarcts right basal ganglia/left cerebellum.  TTE with LVEF [id number], grade 1 diastolic dysfunction, noted aortic bioprosthetic aortic valve, IVC normal in size.  TSH 2.709, within normal limits.  EtOH level less than 10.  Urinalysis unrevealing.  Afebrile without leukocytosis, unlikely infectious etiology.  Patient was noted to be orthostatic which is likely etiology to her near syncopal episode. --Restarted home antihypertensives at a reduced dose --Encourage increased oral intake --Continue to monitor closely, PT/OT  Acute renal failure on CKD stage IV: Resolved Creatinine 2.54 on admission.  Suspect prerenal azotemia secondary to dehydration.  Patient received IV fluid hydration with improvement of creatinine. (Baseline Cr 2.3) --Cr 2.54>1.98>2.09 --Avoid nephrotoxins, renal dose all medications --Follow BMP daily  Paroxysmal atrial fibrillation Not on rate controlling medication or anticoagulant outpatient.  Likely poor candidate for anticoagulation due to fall risk.  EKG on arrival with normal sinus rhythm. --no arrhythmia noted on telemetry; dc tele  Essential hypertension Home regimen includes amlodipine '10mg'$  daily.  Patient was noted to be orthostatic on admission. --Restart amlodipine '5mg'$  PO daily; lower dose than home --Hydralazine 10 mg p.o. q8h prn SBP >170 or DBP >110 --continue to monitor BP closely  Type 2 diabetes mellitus Hemoglobin A1C 6.5 08/20/2018. --Repeat Hgb A1c --SSI for coverage --CBGs qAC/HS  Hx CVA, ischemic --Continue aspirin and statin  Hyperlipidemia: pravastatin '20mg'$  PO daily  Anxiety/depression --Lexapro '5mg'$  PO daily  Moderate protein calorie malnutrition Body mass index is 18.39 kg/m.  --Nutrition consult  Tobacco  use disorder Family reports she smokes roughly 4 packs/day.  Counseled on need for  cessation. --Nicotine patch  Cognitive impairment Cerebral atrophy noted on imaging study with prior history of CVA and microvascular ischemic changes.  Family reports patient does not eat well at home and with increasing confusion.  Also reported not taking her medications accordingly as prescribed with missing several doses. --Pending SNF placement, likely would benefit from SNF/ALF/long-term care following acute rehabilitation  Aortic atherosclerosis --aspirin and statin   DVT prophylaxis: Lovenox   Code Status: Full Code Family Communication: No family present at bedside this morning, updated patient's sisters yesterday extensively at bedside  Disposition Plan:  Level of care: Telemetry Medical Status is: Observation  The patient remains OBS appropriate and will d/c before 2 midnights.  Dispo: The patient is from: Home              Anticipated d/c is to: SNF              Patient currently is medically stable to d/c.   Difficult to place patient No   Consultants:   none  Procedures:   TEE  Antimicrobials:   none   Subjective: Patient seen examined bedside, resting comfortably.  Sleeping but easily arousable.  No specific complaints this morning other than continued weakness/fatigue.  Denies headache, no visual changes, no chest pain, palpitations, no shortness of breath, no abdominal pain, no weakness, no fatigue, no paresthesias.  No acute events overnight per nursing staff.  Objective: Vitals:   08/16/20 2050 08/17/20 0025 08/17/20 0550 08/17/20 1021  BP: 134/62 122/74 109/71 (!) 119/53  Pulse: (!) 56     Resp: 18  16   Temp: 97.7 F (36.5 C) 97.8 F (36.6 C) 97.9 F (36.6 C)   TempSrc: Oral Oral Oral   SpO2: 97% 99% 99%   Weight:   50.9 kg   Height:        Intake/Output Summary (Last 24 hours) at 08/17/2020 1138 Last data filed at 08/17/2020 0947 Gross per 24 hour   Intake 1086 ml  Output 750 ml  Net 336 ml   Filed Weights   08/13/20 1625 08/16/20 0501 08/17/20 0550  Weight: 52.6 kg 51 kg 50.9 kg    Examination:  General exam: Appears calm and comfortable, thin in appearance with muscle wasting Respiratory system: Clear to auscultation. Respiratory effort normal.  Oxygenating well on room air Cardiovascular system: S1 & S2 heard, RRR. No JVD, murmurs, rubs, gallops or clicks. No pedal edema. Gastrointestinal system: Abdomen is nondistended, soft and nontender. No organomegaly or masses felt. Normal bowel sounds heard. Central nervous system: Alert and oriented. No focal neurological deficits. Extremities: Symmetric 5 x 5 power. Skin: No rashes, lesions or ulcers Psychiatry: Judgement and insight appear poor. Mood & affect appropriate.     Data Reviewed: I have personally reviewed following labs and imaging studies  CBC: Recent Labs  Lab 08/13/20 1640 08/14/20 0235 08/16/20 0646  WBC 6.3 8.8 6.3  NEUTROABS 4.2  --   --   HGB 11.9* 12.8 12.6  HCT 36.8 39.9 36.0  MCV 89.3 88.5 84.7  PLT 174 180 [id number]   Basic Metabolic Panel: Recent Labs  Lab 08/13/20 1640 08/14/20 0235 08/15/20 0804 08/16/20 0646 08/17/20 0716  NA 133* 136 135 132* 131*  K 4.0 3.6 4.4 4.6 4.8  CL 105 104 104 102 101  CO2 17* 22 22 20* 20*  GLUCOSE 183* 161* 143* 160* 158*  BUN '23 21 18 '$ 25* 31*  CREATININE 2.54* 2.09* 1.98* 2.09* 2.55*  CALCIUM 8.6* 8.8* 9.2 9.1  9.0   GFR: Estimated Creatinine Clearance: 15.3 mL/min (A) (by C-G formula based on SCr of 2.55 mg/dL (H)). Liver Function Tests: Recent Labs  Lab 08/13/20 1640  AST 14*  ALT 8  ALKPHOS 84  BILITOT 0.6  PROT 6.5  ALBUMIN 3.4*   No results for input(s): LIPASE, AMYLASE in the last 168 hours. Recent Labs  Lab 08/14/20 1603  AMMONIA 11   Coagulation Profile: Recent Labs  Lab 08/13/20 1640  INR 1.1   Cardiac Enzymes: No results for input(s): CKTOTAL, CKMB, CKMBINDEX, TROPONINI in  the last 168 hours. BNP (last 3 results) No results for input(s): PROBNP in the last 8760 hours. HbA1C: Recent Labs    08/16/20 0646  HGBA1C 7.1*   CBG: Recent Labs  Lab 08/13/20 1633 08/14/20 0455 08/15/20 0620 08/16/20 0605  GLUCAP 172* 148* 151* 160*   Lipid Profile: No results for input(s): CHOL, HDL, LDLCALC, TRIG, CHOLHDL, LDLDIRECT in the last 72 hours. Thyroid Function Tests: Recent Labs    08/14/20 1603  TSH 2.709   Anemia Panel: Recent Labs    08/14/20 1603  VITAMINB12 937*   Sepsis Labs: No results for input(s): PROCALCITON, LATICACIDVEN in the last 168 hours.  Recent Results (from the past 240 hour(s))  SARS CORONAVIRUS 2 (TAT 6-24 HRS) Nasopharyngeal Nasopharyngeal Swab     Status: None   Collection Time: 08/14/20  8:49 AM   Specimen: Nasopharyngeal Swab  Result Value Ref Range Status   SARS Coronavirus 2 NEGATIVE NEGATIVE Final    Comment: (NOTE) SARS-CoV-2 target nucleic acids are NOT DETECTED.  The SARS-CoV-2 RNA is generally detectable in upper and lower respiratory specimens during the acute phase of infection. Negative results do not preclude SARS-CoV-2 infection, do not rule out co-infections with other pathogens, and should not be used as the sole basis for treatment or other patient management decisions. Negative results must be combined with clinical observations, patient history, and epidemiological information. The expected result is Negative.  Fact Sheet for Patients: SugarRoll.be  Fact Sheet for Healthcare Providers: https://www.woods-mathews.com/  This test is not yet approved or cleared by the [place] FDA and  has been authorized for detection and/or diagnosis of SARS-CoV-2 by FDA under an Emergency Use Authorization (EUA). This EUA will remain  in effect (meaning this test can be used) for the duration of the COVID-19 declaration under Se ction 564(b)(1) of the Act, 21  U.S.C. section 360bbb-3(b)(1), unless the authorization is terminated or revoked sooner.  Performed at [facility], [place] [address] [place], [place] [address]          Radiology Studies: No results found.      Scheduled Meds: . amLODipine  5 mg Oral Daily  . aspirin EC  81 mg Oral Daily  . cholecalciferol  2,000 Units Oral Daily  . docusate sodium  100 mg Oral BID  . enoxaparin (LOVENOX) injection  30 mg Subcutaneous QHS  . escitalopram  5 mg Oral Daily  . multivitamin with minerals  1 tablet Oral Daily  . nicotine  21 mg Transdermal Daily  . polyethylene glycol  17 g Oral Daily  . pravastatin  20 mg Oral Daily  . senna-docusate  1 tablet Oral QHS  . sodium chloride flush  3 mL Intravenous Q12H  . cyanocobalamin  1,000 mcg Oral Daily   Continuous Infusions:   LOS: 0 days    Time spent: 39 minutes spent on chart review, discussion with nursing staff, consultants, updating family and interview/physical  exam; more than 50% of that time was spent in counseling and/or coordination of care.    Micaila Ziemba J [place] DO [facility] Available via Epic secure chat 7am-7pm After these hours, please refer to coverage provider listed on [email or web address] 08/17/2020, 11:38 AM

## 2020-08-17 NOTE — TOC Progression Note (Signed)
Transition of Care [facility]) - Progression Note    Patient Details  Name: Sandra Sheppard MRN: [id number] Date of Birth: 10-12-1945  Transition of Care [facility]) CM/SW [place], [place] Phone Number: 08/17/2020, 12:40 PM  Clinical Narrative:     CSW spoke with patients sister [name] and gave SNF bed offers. Patients sister accepted SNF bed offers with [facility], CSW called and spoke with Loy from [facility] who confirmed they would start insurance authorization for patient.  Patient has SNF bed at [facility]. Insurance authorization is pending.  CSW will continue to follow.    Expected Discharge Plan: [place] Barriers to Discharge: Continued Medical Work up  Expected Discharge Plan and Services Expected Discharge Plan: [place] In-house Referral: Clinical Social Work     Living arrangements for the past 2 months: Single Family Home                                       Social Determinants of Health (SDOH) Interventions    Readmission Risk Interventions No flowsheet data found.

## 2020-08-17 NOTE — Progress Notes (Addendum)
Initial Nutrition Assessment  DOCUMENTATION CODES:   Severe malnutrition in context of chronic illness  INTERVENTION:    Ensure Enlive po TID, each supplement provides 350 kcal and 20 grams of protein.  Liberalize diet to regular.  NUTRITION DIAGNOSIS:   Severe Malnutrition related to chronic illness (CKD, cardiac illness) as evidenced by severe muscle depletion,severe fat depletion.  GOAL:   Patient will meet greater than or equal to 90% of their needs  MONITOR:   PO intake,Supplement acceptance  REASON FOR ASSESSMENT:   Consult Assessment of nutrition requirement/status  ASSESSMENT:   75 yo female admitted with presyncopal episode. PMH includes mitral valve stenosis, PVD, HTN, HLD, A fib, CKD, CVD, osteoporosis, DM, smoker.   Patient is awaiting SNF placement for rehab.   Spoke with patient and her niece and sister at bedside. Unsure of usual weight, but relays that patient has definitely lost a lot of weight. She has been eating poorly for a while. She ate 100% of lunch today. She likes chocolate and vanilla Ensure. Took patient a chocolate Ensure supplement and she started sipping on it via straw.   Labs reviewed. Na 131  Medications reviewed and include cholecalciferol, Colace, MVI with minerals, Miralax, Senokot-S, cyanocobalamin.  Weight history reviewed. Weight is down by 3% over the past 1.5 months. No significant weight changes recently.  Underweight with BMI=18.4  Patient has multiple areas of severe muscle and subcutaneous fat depletion, meeting criteria for severe malnutrition.  NUTRITION - FOCUSED PHYSICAL EXAM:  Flowsheet Row Most Recent Value  Orbital Region Moderate depletion  Upper Arm Region Severe depletion  Thoracic and Lumbar Region Severe depletion  Buccal Region Severe depletion  Temple Region Severe depletion  Clavicle Bone Region Severe depletion  Clavicle and Acromion Bone Region Severe depletion  Scapular Bone Region Severe  depletion  Dorsal Hand Severe depletion  Patellar Region Severe depletion  Anterior Thigh Region Severe depletion  Posterior Calf Region Severe depletion  Edema (RD Assessment) None  Hair Reviewed  Eyes Reviewed  Mouth Reviewed  Skin Reviewed  Nails Reviewed       Diet Order:   Diet Order            Diet Heart Room service appropriate? Yes; Fluid consistency: Thin  Diet effective now                 EDUCATION NEEDS:   Education needs have been addressed (discussed maximizing protein and calorie intake with patient's family)  Skin:  Skin Assessment: Reviewed RN Assessment  Last BM:  3/6  Height:   Ht Readings from Last 1 Encounters:  08/13/20 5' 5.5" (1.664 m)    Weight:   Wt Readings from Last 1 Encounters:  08/17/20 50.9 kg    Ideal Body Weight:  58 kg  BMI:  Body mass index is 18.39 kg/m.  Estimated Nutritional Needs:   Kcal:  1550-1750  Protein:  70-80 gm  Fluid:  >/= 1.5 L    Lucas Mallow, RD, LDN, CNSC Please refer to Amion for contact information.

## 2020-08-17 NOTE — Progress Notes (Addendum)
Physical Therapy Treatment Patient Details Name: Sandra Sheppard MRN: [id number] DOB: [birth year] Today's Date: 08/17/2020    History of Present Illness Pt adm 3/3 with near syncopal episode and falling in to the bushes when walking her dog. +orthostatics in ED. MRI and CT negative for acute changes. Pt with confusion. Pt with  PMH - AAA, ckd, dm, htn, pvd,paf, avr.    PT Comments    Pt continues to have confusion and poor balance. Pt with orthostatic BP but denied light headedness. Continue to recommend SNF.    Orthostatic BPs  Supine 130/66  Sitting 117/79  Standing 99/54  Standing after 3 min 99/59    Follow Up Recommendations  SNF (if pt refuses maximize [facility] services)     Equipment Recommendations  Other (comment) (rollator)    Recommendations for Other Services       Precautions / Restrictions Precautions Precautions: Fall Restrictions Weight Bearing Restrictions: No    Mobility  Bed Mobility Overal bed mobility: Needs Assistance Bed Mobility: Supine to Sit     Supine to sit: Supervision;HOB elevated     General bed mobility comments: Incr time and supervision for lines as pt easily distracted by lines    Transfers Overall transfer level: Needs assistance Equipment used: 1 person hand held assist;4-wheeled walker Transfers: Sit to/from Stand Sit to Stand: Min assist         General transfer comment: Assist for balance due to pt with posterior bias and leaning posteriorly onto bed.  Ambulation/Gait Ambulation/Gait assistance: Min Web designer (Feet): 125 Feet Assistive device: 4-wheeled walker Gait Pattern/deviations: Step-through pattern;Decreased stride length;Leaning posteriorly Gait velocity: decr Gait velocity interpretation: <1.31 ft/sec, indicative of household ambulator General Gait Details: Assist for balance and support.Pt with posterior lean and incr rt lateral trunk lean. Pt limited by pain in lt knee. Returned to room seated on  rollator.   Stairs             Wheelchair Mobility    Modified Rankin (Stroke Patients Only)       Balance Overall balance assessment: Needs assistance Sitting-balance support: Single extremity supported;Bilateral upper extremity supported;No upper extremity supported;Feet supported Sitting balance-Leahy Scale: Poor Sitting balance - Comments: Pt sitting without UE support but with occasional loss of balance posteriorly Postural control: Posterior lean Standing balance support: Single extremity supported Standing balance-Leahy Scale: Poor Standing balance comment: Hand held min to mod assist for static standing due to posterior lean                            Cognition Arousal/Alertness: Awake/alert Behavior During Therapy: WFL for tasks assessed/performed Overall Cognitive Status: Impaired/Different from baseline Area of Impairment: Orientation;Attention;Memory;Following commands;Safety/judgement;Awareness;Problem solving                 Orientation Level: Disoriented to;Time;Situation Current Attention Level: Sustained Memory: Decreased short-term memory Following Commands: Follows one step commands consistently Safety/Judgement: Decreased awareness of safety;Decreased awareness of deficits Awareness: Emergent Problem Solving: Requires verbal cues;Requires tactile cues General Comments: Pt hallucinating      Exercises      General Comments        Pertinent Vitals/Pain Pain Assessment: Faces Faces Pain Scale: Hurts little more Pain Location: lt knee Pain Descriptors / Indicators: Grimacing;Guarding Pain Intervention(s): Limited activity within patient's tolerance;Repositioned    Home Living                      Prior  Function            PT Goals (current goals can now be found in the care plan section) Acute Rehab PT Goals Patient Stated Goal: go home Progress towards PT goals: Progressing toward goals     Frequency    Min 3X/week      PT Plan Current plan remains appropriate    Co-evaluation              AM-PAC PT "6 Clicks" Mobility   Outcome Measure  Help needed turning from your back to your side while in a flat bed without using bedrails?: None Help needed moving from lying on your back to sitting on the side of a flat bed without using bedrails?: A Little Help needed moving to and from a bed to a chair (including a wheelchair)?: A Little Help needed standing up from a chair using your arms (e.g., wheelchair or bedside chair)?: A Little Help needed to walk in hospital room?: A Little Help needed climbing 3-5 steps with a railing? : A Little 6 Click Score: 19    End of Session Equipment Utilized During Treatment: Gait belt Activity Tolerance: Patient tolerated treatment well Patient left: in chair;with call bell/phone within reach;with chair alarm set Nurse Communication: Mobility status PT Visit Diagnosis: Unsteadiness on feet (R26.81);Other abnormalities of gait and mobility (R26.89);Muscle weakness (generalized) (M62.81)     Time: [id number] PT Time Calculation (min) (ACUTE ONLY): 19 min  Charges:  $Gait Training: 8-22 mins                     [place] Pager [phone] Office [place] 08/17/2020, 11:39 AM

## 2020-08-18 DIAGNOSIS — R55 Syncope and collapse: Secondary | ICD-10-CM | POA: Diagnosis not present

## 2020-08-18 LAB — BASIC METABOLIC PANEL
Anion gap: 7 (ref 5–15)
BUN: 34 mg/dL — ABNORMAL HIGH (ref 8–23)
CO2: 25 mmol/L (ref 22–32)
Calcium: 9.2 mg/dL (ref 8.9–10.3)
Chloride: 100 mmol/L (ref 98–111)
Creatinine, Ser: 2.45 mg/dL — ABNORMAL HIGH (ref 0.44–1.00)
GFR, Estimated: 20 mL/min — ABNORMAL LOW (ref >60)
Glucose, Bld: 148 mg/dL — ABNORMAL HIGH (ref 70–99)
Potassium: 5 mmol/L (ref 3.5–5.1)
Sodium: 132 mmol/L — ABNORMAL LOW (ref 135–145)

## 2020-08-18 LAB — GLUCOSE, CAPILLARY: Glucose-Capillary: 149 mg/dL — ABNORMAL HIGH (ref 70–99)

## 2020-08-18 NOTE — Progress Notes (Addendum)
PROGRESS NOTE    [name]  [id number] DOB: June 25, 1945 DOA: 08/13/2020 PCP: Prince Solian, MD    Brief Narrative:  Sandra Sheppard is a 75 year old female with past medical history significant for AAA, paroxysmal atrial fibrillation, carotid artery stenosis, aortic/mitral valve stenosis, CKD stage IV, type 2 diabetes mellitus, hyperlipidemia, essential hypertension, peripheral vascular disease, osteoporosis who presented to the ED following presyncopal episode. Patient reported being lightheaded, off balance and nearly passing out while walking her dog in her neighborhood on 08/13/2020. Patient was assisted to her feet by a neighbor and EMS was activated and subsequently taken to the ED. Patient denied any chest pain, shortness of breath, palpitations, diaphoresis. No lower extremity edema or about bladder/bowel incontinence. No reports of seizure-like activity.  In the ED, vital signs were stable on room air. Sodium 133, potassium 4.0, chloride 105, CO2 17, glucose 23, BUN 23, creatinine 2.54, AST 14, ALT 8. WBC 6.3, hemoglobin 11.9, platelets 174. High-sensitivity troponin 9>10. INR 1.1. Urinalysis unrevealing. EtOH level less than 10. Chest x-ray with no active cardiopulmonary disease process.   Assessment & Plan:   Principal Problem:   Near syncope Active Problems:   Paroxysmal atrial fibrillation (HCC)   Dyslipidemia   Normocytic anemia   Diabetes mellitus type II, non insulin dependent (HCC)   CKD (chronic kidney disease) stage 4, GFR 15-29 ml/min (HCC)   Syncope and collapse   Protein-calorie malnutrition, severe   Near syncope Patient presenting to the ED following presyncopal episode while walking her dog in which she fell.  No loss of consciousness, no seizure-like activity reported.  CT head/C-spine without contrast with no acute intracranial abnormality, soft tissue contusion and hematoma right forehead, no fracture or static subluxation of the cervical spine; advanced  small vessel white matter disease, nonacute lacunar infarctions right caudate head and lentiform nuclei.  Tress, limited due to motion artifact but no acute intracranial abnormality, age-related cerebral atrophy with moderate/advanced chronic microcytic vascular ischemic disease with remote lacunar infarcts right basal ganglia/left cerebellum.  TTE with LVEF [id number], grade 1 diastolic dysfunction, noted aortic bioprosthetic aortic valve, IVC normal in size.  TSH 2.709, within normal limits.  EtOH level less than 10.  Urinalysis unrevealing.  Afebrile without leukocytosis, unlikely infectious etiology.  Patient was noted to be orthostatic which is likely etiology to her near syncopal episode. --Restarted home antihypertensives at a reduced dose --Encourage increased oral intake --Continue to monitor closely, PT/OT  Acute renal failure on CKD stage IV: Resolved Creatinine 2.54 on admission.  Suspect prerenal azotemia secondary to dehydration.  Patient received IV fluid hydration with improvement of creatinine. (Baseline Cr 2.3) --Cr 2.54>1.98>2.09>2.45 --Avoid nephrotoxins, renal dose all medications --Follow BMP daily  Paroxysmal atrial fibrillation Not on rate controlling medication or anticoagulant outpatient.  Likely poor candidate for anticoagulation due to fall risk.  EKG on arrival with normal sinus rhythm. --no arrhythmia noted on telemetry; dc tele  Essential hypertension Home regimen includes amlodipine '10mg'$  daily.  Patient was noted to be orthostatic on admission. --Restart amlodipine '5mg'$  PO daily; lower dose than home --Hydralazine 10 mg p.o. q8h prn SBP >170 or DBP >110 --continue to monitor BP closely  Type 2 diabetes mellitus Hemoglobin A1C 6.5 08/20/2018. --Repeat Hgb A1c --SSI for coverage --CBGs qAC/HS  Hx CVA, ischemic --Continue aspirin and statin  Hyperlipidemia: pravastatin '20mg'$  PO daily  Anxiety/depression --Lexapro '5mg'$  PO daily  Severe protein calorie  malnutrition Body mass index is 18.39 kg/m.  Nutrition Status: Nutrition Problem: Severe Malnutrition Etiology: chronic  illness (CKD, cardiac illness) Signs/Symptoms: severe muscle depletion,severe fat depletion Interventions: Ensure Enlive (each supplement provides 350kcal and 20 grams of protein),MVI --Nutrition following; continue to encourage increased oral intake  Tobacco use disorder Family reports she smokes roughly 4 packs/day.  Counseled on need for cessation. --Nicotine patch  Cognitive impairment Cerebral atrophy noted on imaging study with prior history of CVA and microvascular ischemic changes.  Family reports patient does not eat well at home and with increasing confusion.  Also reported not taking her medications accordingly as prescribed with missing several doses. --Pending SNF placement, likely would benefit from SNF/ALF/long-term care following acute rehabilitation  Aortic atherosclerosis --aspirin and statin   DVT prophylaxis: Lovenox   Code Status: Full Code Family Communication: No family present at bedside this morning  Disposition Plan:  Level of care: Med-Surg Status is: Observation  The patient remains OBS appropriate and will d/c before 2 midnights.  Dispo: The patient is from: Home              Anticipated d/c is to: SNF              Patient currently is medically stable to d/c.   Difficult to place patient No   Consultants:   none  Procedures:   TEE  Antimicrobials:   none   Subjective: Patient seen examined bedside, resting comfortably.  Sleeping but easily arousable.  No specific complaints this morning other than continued weakness/fatigue.  No family present this morning.  Denies headache, no visual changes, no chest pain, palpitations, no shortness of breath, no abdominal pain, no weakness, no fatigue, no paresthesias.  No acute events overnight per nursing staff.  Objective: Vitals:   08/17/20 1326 08/17/20 1844 08/17/20 2117  08/18/20 0622  BP: (!) 129/56 (!) 155/67 (!) 130/58 (!) 181/68  Pulse:  75 64 69  Resp: '16 16 18 18  '$ Temp: 97.6 F (36.4 C) 98.6 F (37 C)    TempSrc: Oral     SpO2:  94% 93% 95%  Weight:      Height:        Intake/Output Summary (Last 24 hours) at 08/18/2020 1110 Last data filed at 08/18/2020 0200 Gross per 24 hour  Intake 120 ml  Output 125 ml  Net -5 ml   Filed Weights   08/13/20 1625 08/16/20 0501 08/17/20 0550  Weight: 52.6 kg 51 kg 50.9 kg    Examination:  General exam: Appears calm and comfortable, thin in appearance with muscle wasting Respiratory system: Clear to auscultation. Respiratory effort normal.  Oxygenating well on room air Cardiovascular system: S1 & S2 heard, RRR. No JVD, murmurs, rubs, gallops or clicks. No pedal edema. Gastrointestinal system: Abdomen is nondistended, soft and nontender. No organomegaly or masses felt. Normal bowel sounds heard. Central nervous system: Alert and oriented. No focal neurological deficits. Extremities: Symmetric 5 x 5 power. Skin: No rashes, lesions or ulcers Psychiatry: Judgement and insight appear poor. Mood & affect appropriate.     Data Reviewed: I have personally reviewed following labs and imaging studies  CBC: Recent Labs  Lab 08/13/20 1640 08/14/20 0235 08/16/20 0646  WBC 6.3 8.8 6.3  NEUTROABS 4.2  --   --   HGB 11.9* 12.8 12.6  HCT 36.8 39.9 36.0  MCV 89.3 88.5 84.7  PLT 174 180 [id number]   Basic Metabolic Panel: Recent Labs  Lab 08/14/20 0235 08/15/20 0804 08/16/20 0646 08/17/20 0716 08/18/20 0716  NA 136 135 132* 131* 132*  K 3.6 4.4 4.6 4.8  5.0  CL 104 104 102 101 100  CO2 22 22 20* 20* 25  GLUCOSE 161* 143* 160* 158* 148*  BUN 21 18 25* 31* 34*  CREATININE 2.09* 1.98* 2.09* 2.55* 2.45*  CALCIUM 8.8* 9.2 9.1 9.0 9.2   GFR: Estimated Creatinine Clearance: 15.9 mL/min (A) (by C-G formula based on SCr of 2.45 mg/dL (H)). Liver Function Tests: Recent Labs  Lab 08/13/20 1640  AST 14*  ALT 8   ALKPHOS 84  BILITOT 0.6  PROT 6.5  ALBUMIN 3.4*   No results for input(s): LIPASE, AMYLASE in the last 168 hours. Recent Labs  Lab 08/14/20 1603  AMMONIA 11   Coagulation Profile: Recent Labs  Lab 08/13/20 1640  INR 1.1   Cardiac Enzymes: No results for input(s): CKTOTAL, CKMB, CKMBINDEX, TROPONINI in the last 168 hours. BNP (last 3 results) No results for input(s): PROBNP in the last 8760 hours. HbA1C: Recent Labs    08/16/20 0646  HGBA1C 7.1*   CBG: Recent Labs  Lab 08/13/20 1633 08/14/20 0455 08/15/20 0620 08/16/20 0605 08/18/20 0833  GLUCAP 172* 148* 151* 160* 149*   Lipid Profile: No results for input(s): CHOL, HDL, LDLCALC, TRIG, CHOLHDL, LDLDIRECT in the last 72 hours. Thyroid Function Tests: No results for input(s): TSH, T4TOTAL, FREET4, T3FREE, THYROIDAB in the last 72 hours. Anemia Panel: No results for input(s): VITAMINB12, FOLATE, FERRITIN, TIBC, IRON, RETICCTPCT in the last 72 hours. Sepsis Labs: No results for input(s): PROCALCITON, LATICACIDVEN in the last 168 hours.  Recent Results (from the past 240 hour(s))  SARS CORONAVIRUS 2 (TAT 6-24 HRS) Nasopharyngeal Nasopharyngeal Swab     Status: None   Collection Time: 08/14/20  8:49 AM   Specimen: Nasopharyngeal Swab  Result Value Ref Range Status   SARS Coronavirus 2 NEGATIVE NEGATIVE Final    Comment: (NOTE) SARS-CoV-2 target nucleic acids are NOT DETECTED.  The SARS-CoV-2 RNA is generally detectable in upper and lower respiratory specimens during the acute phase of infection. Negative results do not preclude SARS-CoV-2 infection, do not rule out co-infections with other pathogens, and should not be used as the sole basis for treatment or other patient management decisions. Negative results must be combined with clinical observations, patient history, and epidemiological information. The expected result is Negative.  Fact Sheet for  Patients: SugarRoll.be  Fact Sheet for Healthcare Providers: https://www.woods-mathews.com/  This test is not yet approved or cleared by the [place] FDA and  has been authorized for detection and/or diagnosis of SARS-CoV-2 by FDA under an Emergency Use Authorization (EUA). This EUA will remain  in effect (meaning this test can be used) for the duration of the COVID-19 declaration under Se ction 564(b)(1) of the Act, 21 U.S.C. section 360bbb-3(b)(1), unless the authorization is terminated or revoked sooner.  Performed at [facility], [place] [address] [place], [place] [address]   SARS CORONAVIRUS 2 (TAT 6-24 HRS) Nasopharyngeal Nasopharyngeal Swab     Status: None   Collection Time: 08/17/20  1:25 PM   Specimen: Nasopharyngeal Swab  Result Value Ref Range Status   SARS Coronavirus 2 NEGATIVE NEGATIVE Final    Comment: (NOTE) SARS-CoV-2 target nucleic acids are NOT DETECTED.  The SARS-CoV-2 RNA is generally detectable in upper and lower respiratory specimens during the acute phase of infection. Negative results do not preclude SARS-CoV-2 infection, do not rule out co-infections with other pathogens, and should not be used as the sole basis for treatment or other patient management decisions. Negative results must be combined with  clinical observations, patient history, and epidemiological information. The expected result is Negative.  Fact Sheet for Patients: SugarRoll.be  Fact Sheet for Healthcare Providers: https://www.woods-mathews.com/  This test is not yet approved or cleared by the [place] FDA and  has been authorized for detection and/or diagnosis of SARS-CoV-2 by FDA under an Emergency Use Authorization (EUA). This EUA will remain  in effect (meaning this test can be used) for the duration of the COVID-19 declaration under Se ction 564(b)(1) of the Act, 21 U.S.C. section  360bbb-3(b)(1), unless the authorization is terminated or revoked sooner.  Performed at [facility], [place] [address], [place], [place] [address]          Radiology Studies: No results found.      Scheduled Meds: . amLODipine  5 mg Oral Daily  . aspirin EC  81 mg Oral Daily  . cholecalciferol  2,000 Units Oral Daily  . docusate sodium  100 mg Oral BID  . enoxaparin (LOVENOX) injection  30 mg Subcutaneous QHS  . escitalopram  5 mg Oral Daily  . feeding supplement  237 mL Oral TID BM  . multivitamin with minerals  1 tablet Oral Daily  . nicotine  21 mg Transdermal Daily  . polyethylene glycol  17 g Oral Daily  . pravastatin  20 mg Oral Daily  . senna-docusate  1 tablet Oral QHS  . sodium chloride flush  3 mL Intravenous Q12H  . cyanocobalamin  1,000 mcg Oral Daily   Continuous Infusions:   LOS: 1 day    Time spent: 35 minutes spent on chart review, discussion with nursing staff, consultants, updating family and interview/physical exam; more than 50% of that time was spent in counseling and/or coordination of care.    Nyrah Demos J [place] DO [facility] Available via Epic secure chat 7am-7pm After these hours, please refer to coverage provider listed on [email or web address] 08/18/2020, 11:10 AM

## 2020-08-18 NOTE — Progress Notes (Signed)
Occupational Therapy Treatment Patient Details Name: Sandra Sheppard MRN: [id number] DOB: [birth year] Today's Date: 08/18/2020    History of present illness Pt adm 3/3 with near syncopal episode and falling in to the bushes when walking her dog. +orthostatics in ED. MRI and CT negative for acute changes. Pt with confusion. Pt with  PMH - AAA, ckd, dm, htn, pvd,paf, avr.   OT comments  Pt with gradual progress towards OT goals though limited by sitting/standing balance deficits and dizziness throughout session though BP readings WFL. Pt with intermittent confusion to place, initially able to report she was at hospital though later asked if she was going to hospital later today. Pt overall Min A for UB ADLs and Max A for LB ADLs today sitting EOB. Without B UE support sitting EOB, pt with constant R lateral/posterior lean that worsened with eyes closed. In standing, pt with R lateral lean/posterior bias, requiring Mod A for maintaining balance and sequencing RW to recliner chair. Pt repositioned to correct R lateral lean, left with needs in reach.    Follow Up Recommendations  SNF;Supervision/Assistance - 24 hour    Equipment Recommendations  3 in 1 bedside commode;Other (comment) (Rollator)    Recommendations for Other Services      Precautions / Restrictions Precautions Precautions: Fall;Other (comment) Precaution Comments: monitor orthostatic BPs Restrictions Weight Bearing Restrictions: No       Mobility Bed Mobility Overal bed mobility: Needs Assistance Bed Mobility: Supine to Sit     Supine to sit: Min assist;HOB elevated     General bed mobility comments: Min A to get EOB, consistent cues needed to initiate and sequence    Transfers Overall transfer level: Needs assistance Equipment used: Rolling walker (2 wheeled) Transfers: Sit to/from [facility] Sit to Stand: Min assist Stand pivot transfers: Mod assist       General transfer comment: Min A for  power up and to maintain standing. Pt with consistent R lateral and posterior bias requiring hands on asssist throughout for transfer, Mod A overall to maintain balance and sequence RW to chair for NT linen change    Balance Overall balance assessment: Needs assistance Sitting-balance support: Single extremity supported;Bilateral upper extremity supported;No upper extremity supported;Feet supported Sitting balance-Leahy Scale: Poor Sitting balance - Comments: LOB without B UE support EOB Postural control: Right lateral lean;Posterior lean Standing balance support: Bilateral upper extremity supported;During functional activity Standing balance-Leahy Scale: Poor Standing balance comment: reliant on UE support and external support to maintain standing                           ADL either performed or assessed with clinical judgement   ADL Overall ADL's : Needs assistance/impaired     Grooming: Min guard;Sitting;Wash/dry face Grooming Details (indicate cue type and reason): min guard to maintain balance while washing face, increased LOB when eyes closed Upper Body Bathing: Minimal assistance;Sitting Upper Body Bathing Details (indicate cue type and reason): Min A to wash back sitting EOB Lower Body Bathing: Maximal assistance;Sitting/lateral leans;Sit to/from stand Lower Body Bathing Details (indicate cue type and reason): Max A to bathe LB after urine incontinence sitting EOB. hands on assist needed to maintain balance Upper Body Dressing : Min guard;Sitting Upper Body Dressing Details (indicate cue type and reason): min guard to don clean gown sitting EOB Lower Body Dressing: Maximal assistance;Sit to/from stand;Sitting/lateral leans Lower Body Dressing Details (indicate cue type and reason): Max A to don clean underwear  due to poor balance sitting and standing               General ADL Comments: Limited by poor balance sitting EOB without UE support, constant reports of  dizziness though BP WFL     Vision   Vision Assessment?: No apparent visual deficits   Perception     Praxis      Cognition Arousal/Alertness: Awake/alert Behavior During Therapy: WFL for tasks assessed/performed Overall Cognitive Status: Impaired/Different from baseline Area of Impairment: Orientation;Attention;Memory;Following commands;Safety/judgement;Awareness;Problem solving                 Orientation Level: Disoriented to;Time;Situation;Place Current Attention Level: Sustained Memory: Decreased short-term memory Following Commands: Follows one step commands consistently Safety/Judgement: Decreased awareness of safety;Decreased awareness of deficits Awareness: Emergent Problem Solving: Requires verbal cues;Requires tactile cues General Comments: Pt with intermittent confusion on where she is at, initially able to respond [facility] then later asks if she has to go to the hospital today. Pt with decreased problem solving, correction of balance, etc        Exercises     Shoulder Instructions       General Comments Assessed BP at 145/66 (89) and 156/60 (90) after transfer. Pt with R lateral lean in chair, repositioned with pillow    Pertinent Vitals/ Pain       Pain Assessment: No/denies pain  Home Living                                          Prior Functioning/Environment              Frequency  Min 2X/week        Progress Toward Goals  OT Goals(current goals can now be found in the care plan section)  Progress towards OT goals: OT to reassess next treatment  Acute Rehab OT Goals Patient Stated Goal: get home OT Goal Formulation: With patient Time For Goal Achievement: 08/29/20 Potential to Achieve Goals: Good ADL Goals Pt Will Perform Grooming: with set-up;sitting;standing Pt Will Perform Lower Body Bathing: with set-up;sit to/from stand Pt Will Perform Lower Body Dressing: with set-up;sit to/from stand Pt Will  Transfer to Toilet: with supervision;ambulating;regular height toilet  Plan Discharge plan remains appropriate    Co-evaluation                 AM-PAC OT "6 Clicks" Daily Activity     Outcome Measure   Help from another person eating meals?: None Help from another person taking care of personal grooming?: A Little Help from another person toileting, which includes using toliet, bedpan, or urinal?: A Little Help from another person bathing (including washing, rinsing, drying)?: A Lot Help from another person to put on and taking off regular upper body clothing?: A Little Help from another person to put on and taking off regular lower body clothing?: A Lot 6 Click Score: 17    End of Session Equipment Utilized During Treatment: Gait belt;Rolling walker  OT Visit Diagnosis: Unsteadiness on feet (R26.81);Other symptoms and signs involving cognitive function;History of falling (Z91.81)   Activity Tolerance Patient tolerated treatment well   Patient Left in chair;with call bell/phone within reach;with chair alarm set;Other (comment) (chair alarm pad connected but no nurse call cord in room - NT aware)   Nurse Communication Mobility status        Time: [id number] OT Time Calculation (min):  33 min  Charges: OT General Charges $OT Visit: 1 Visit OT Treatments $Self Care/Home Management : 23-37 mins  Malachy Chamber, OTR/L Acute Rehab Services Office: [phone]   Layla Maw 08/18/2020, 9:22 AM

## 2020-08-19 DIAGNOSIS — E785 Hyperlipidemia, unspecified: Secondary | ICD-10-CM

## 2020-08-19 DIAGNOSIS — D649 Anemia, unspecified: Secondary | ICD-10-CM

## 2020-08-19 DIAGNOSIS — I480 Paroxysmal atrial fibrillation: Secondary | ICD-10-CM

## 2020-08-19 DIAGNOSIS — N184 Chronic kidney disease, stage 4 (severe): Secondary | ICD-10-CM | POA: Diagnosis not present

## 2020-08-19 DIAGNOSIS — E43 Unspecified severe protein-calorie malnutrition: Secondary | ICD-10-CM

## 2020-08-19 DIAGNOSIS — E119 Type 2 diabetes mellitus without complications: Secondary | ICD-10-CM | POA: Diagnosis not present

## 2020-08-19 DIAGNOSIS — R55 Syncope and collapse: Secondary | ICD-10-CM | POA: Diagnosis not present

## 2020-08-19 LAB — BASIC METABOLIC PANEL
Anion gap: 11 (ref 5–15)
BUN: 37 mg/dL — ABNORMAL HIGH (ref 8–23)
CO2: 20 mmol/L — ABNORMAL LOW (ref 22–32)
Calcium: 8.9 mg/dL (ref 8.9–10.3)
Chloride: 101 mmol/L (ref 98–111)
Creatinine, Ser: 2.25 mg/dL — ABNORMAL HIGH (ref 0.44–1.00)
GFR, Estimated: 22 mL/min — ABNORMAL LOW (ref >60)
Glucose, Bld: 118 mg/dL — ABNORMAL HIGH (ref 70–99)
Potassium: 4.4 mmol/L (ref 3.5–5.1)
Sodium: 132 mmol/L — ABNORMAL LOW (ref 135–145)

## 2020-08-19 LAB — GLUCOSE, CAPILLARY: Glucose-Capillary: 140 mg/dL — ABNORMAL HIGH (ref 70–99)

## 2020-08-19 NOTE — Progress Notes (Signed)
PROGRESS NOTE    Sandra Sheppard  [id number] DOB: 09/11/1945 DOA: 08/13/2020 PCP: Prince Solian, MD    Brief Narrative:   Sandra Sheppard is a 75 year old female with past medical history significant for AAA, paroxysmal atrial fibrillation, carotid artery stenosis, aortic/mitral valve stenosis, CKD stage IV, type 2 diabetes mellitus, hyperlipidemia, essential hypertension, peripheral vascular disease, osteoporosis who presented to the ED following presyncopal episode. Patient reported being lightheaded, off balance and nearly passing out while walking her dog in her neighborhood on 08/13/2020. Patient was assisted to her feet by a neighbor and EMS was activated and subsequently taken to the ED. In the ED, vital signs were stable, on room air.  Patient had mild hyponatremia with elevated creatinine levels.  Chest x-ray with no active cardiopulmonary disease process.  Troponins were negative.  Patient was then admitted to hospital for further evaluation and treatment.  At this time, patient has been awaiting for skilled nursing facility placement   Assessment & Plan:   Principal Problem:   Near syncope Active Problems:   Paroxysmal atrial fibrillation (HCC)   Dyslipidemia   Normocytic anemia   Diabetes mellitus type II, non insulin dependent (HCC)   CKD (chronic kidney disease) stage 4, GFR 15-29 ml/min (HCC)   Syncope and collapse   Protein-calorie malnutrition, severe   Near syncope likely secondary to orthostatic hypotension. No loss of consciousness or seizure-like activity. CT head/C-spine without contrast with no acute intracranial abnormality but hematoma of the right forehead noted.  2D echocardiogram was performed which showed preserved LV function with diastolic dysfunction and bioprosthetic valve.  Antihypertensives have been started at a reduced dose.  Continue physical therapy occupational therapy.  Acute kidney injury on CKD stage IV: Resolved Creatinine 2.54 on  admission.  Acute kidney injury was thought to be secondary to volume depletion.  Improved with IV fluids.  Latest creatinine of 2.2.  Continue to monitor BMP.  Paroxysmal atrial fibrillation Not a good candidate for anticoagulation.  Normal sinus rhythm.   Essential hypertension On  amlodipine '10mg'$  daily at home..  Patient was noted to be orthostatic on admission. Continue Amlodipine 5 mg.  Type 2 diabetes mellitus Hemoglobin A1C on 08/16/2020 was 7.1.  Continue sliding scale insulin, Accu-Cheks, diabetic diet.  Hx CVA Continue aspirin and statin  Hyperlipidemia: Continue statin  Anxiety/depression Tinea Lexapro  Severe protein calorie malnutrition As evidenced by severe muscle depletion, fat depletion, present on admission.  Continue supplements.  Tobacco use disorder Heavy smoker at home.  Currently on nicotine patch  Cognitive impairment CT scan MRI showed cerebral atrophy noted on imaging study with prior history of CVA and microvascular ischemic changes.    Aortic atherosclerosis Continue aspirin and statin  Debility, deconditioning, poor endurance.  Patient was seen by physical therapy recommended skilled nursing facility placement on discharge.  DVT prophylaxis: Lovenox subcu    Code Status: Full Code   Family Communication:  None   Disposition Plan:  Status is: Inpatient  The patient is inpatient due to treatments appropriate due to intensity of illness or inability to take PO, need for rehabilitation  Dispo: The patient is from: Home              Anticipated d/c is to: SNF              Patient currently is medically stable to d/c.   Difficult to place patient No   Consultants:   none  Procedures:   TEE  Antimicrobials:   none  Subjective: Today, patient was seen and examined at bedside.  Patient denies any dizziness, lightheadedness shortness of breath chest pain fever or chills.    Objective: Vitals:   08/18/20 1711 08/18/20 2042  08/19/20 0514 08/19/20 1034  BP: (!) 115/59 135/63 (!) 144/61 125/60  Pulse: 68 (!) 56 (!) 54 (!) 53  Resp: '16 18 16 18  '$ Temp: 98.2 F (36.8 C) (!) 97.5 F (36.4 C) 97.9 F (36.6 C) 97.8 F (36.6 C)  TempSrc:    Oral  SpO2: 94% 95% 95% 95%  Weight:  50.8 kg    Height:        Intake/Output Summary (Last 24 hours) at 08/19/2020 1326 Last data filed at 08/19/2020 0600 Gross per 24 hour  Intake 237 ml  Output 225 ml  Net 12 ml   Filed Weights   08/16/20 0501 08/17/20 0550 08/18/20 2042  Weight: 51 kg 50.9 kg 50.8 kg    Physical examination:  General: Thinly built, not in obvious distress, muscle wasting HENT:   No scleral pallor or icterus noted. Oral mucosa is moist.  Chest:  Clear breath sounds.  Diminished breath sounds bilaterally. No crackles or wheezes.  CVS: S1 &S2 heard. No murmur.  Regular rate and rhythm. Abdomen: Soft, nontender, nondistended.  Bowel sounds are heard.   Extremities: No cyanosis, clubbing or edema.  Peripheral pulses are palpable. Psych: Alert, awake and oriented, poor insight. CNS:  No cranial nerve deficits.  Power equal in all extremities.   Skin: Warm and dry.  No rashes noted.  Data Reviewed: I have personally reviewed following labs and imaging studies  CBC: Recent Labs  Lab 08/13/20 1640 08/14/20 0235 08/16/20 0646  WBC 6.3 8.8 6.3  NEUTROABS 4.2  --   --   HGB 11.9* 12.8 12.6  HCT 36.8 39.9 36.0  MCV 89.3 88.5 84.7  PLT 174 180 [id number]   Basic Metabolic Panel: Recent Labs  Lab 08/15/20 0804 08/16/20 0646 08/17/20 0716 08/18/20 0716 08/19/20 0143  NA 135 132* 131* 132* 132*  K 4.4 4.6 4.8 5.0 4.4  CL 104 102 101 100 101  CO2 22 20* 20* 25 20*  GLUCOSE 143* 160* 158* 148* 118*  BUN 18 25* 31* 34* 37*  CREATININE 1.98* 2.09* 2.55* 2.45* 2.25*  CALCIUM 9.2 9.1 9.0 9.2 8.9   GFR: Estimated Creatinine Clearance: 17.3 mL/min (A) (by C-G formula based on SCr of 2.25 mg/dL (H)). Liver Function Tests: Recent Labs  Lab  08/13/20 1640  AST 14*  ALT 8  ALKPHOS 84  BILITOT 0.6  PROT 6.5  ALBUMIN 3.4*   No results for input(s): LIPASE, AMYLASE in the last 168 hours. Recent Labs  Lab 08/14/20 1603  AMMONIA 11   Coagulation Profile: Recent Labs  Lab 08/13/20 1640  INR 1.1   Cardiac Enzymes: No results for input(s): CKTOTAL, CKMB, CKMBINDEX, TROPONINI in the last 168 hours. BNP (last 3 results) No results for input(s): PROBNP in the last 8760 hours. HbA1C: No results for input(s): HGBA1C in the last 72 hours. CBG: Recent Labs  Lab 08/14/20 0455 08/15/20 0620 08/16/20 0605 08/18/20 0833 08/19/20 0648  GLUCAP 148* 151* 160* 149* 140*   Lipid Profile: No results for input(s): CHOL, HDL, LDLCALC, TRIG, CHOLHDL, LDLDIRECT in the last 72 hours. Thyroid Function Tests: No results for input(s): TSH, T4TOTAL, FREET4, T3FREE, THYROIDAB in the last 72 hours. Anemia Panel: No results for input(s): VITAMINB12, FOLATE, FERRITIN, TIBC, IRON, RETICCTPCT in the last 72 hours. Sepsis Labs: No  results for input(s): PROCALCITON, LATICACIDVEN in the last 168 hours.  Recent Results (from the past 240 hour(s))  SARS CORONAVIRUS 2 (TAT 6-24 HRS) Nasopharyngeal Nasopharyngeal Swab     Status: None   Collection Time: 08/14/20  8:49 AM   Specimen: Nasopharyngeal Swab  Result Value Ref Range Status   SARS Coronavirus 2 NEGATIVE NEGATIVE Final    Comment: (NOTE) SARS-CoV-2 target nucleic acids are NOT DETECTED.  The SARS-CoV-2 RNA is generally detectable in upper and lower respiratory specimens during the acute phase of infection. Negative results do not preclude SARS-CoV-2 infection, do not rule out co-infections with other pathogens, and should not be used as the sole basis for treatment or other patient management decisions. Negative results must be combined with clinical observations, patient history, and epidemiological information. The expected result is Negative.  Fact Sheet for  Patients: SugarRoll.be  Fact Sheet for Healthcare Providers: https://www.woods-mathews.com/  This test is not yet approved or cleared by the [place] FDA and  has been authorized for detection and/or diagnosis of SARS-CoV-2 by FDA under an Emergency Use Authorization (EUA). This EUA will remain  in effect (meaning this test can be used) for the duration of the COVID-19 declaration under Se ction 564(b)(1) of the Act, 21 U.S.C. section 360bbb-3(b)(1), unless the authorization is terminated or revoked sooner.  Performed at [facility], [place] [address] [place], [place] [address]   SARS CORONAVIRUS 2 (TAT 6-24 HRS) Nasopharyngeal Nasopharyngeal Swab     Status: None   Collection Time: 08/17/20  1:25 PM   Specimen: Nasopharyngeal Swab  Result Value Ref Range Status   SARS Coronavirus 2 NEGATIVE NEGATIVE Final    Comment: (NOTE) SARS-CoV-2 target nucleic acids are NOT DETECTED.  The SARS-CoV-2 RNA is generally detectable in upper and lower respiratory specimens during the acute phase of infection. Negative results do not preclude SARS-CoV-2 infection, do not rule out co-infections with other pathogens, and should not be used as the sole basis for treatment or other patient management decisions. Negative results must be combined with clinical observations, patient history, and epidemiological information. The expected result is Negative.  Fact Sheet for Patients: SugarRoll.be  Fact Sheet for Healthcare Providers: https://www.woods-mathews.com/  This test is not yet approved or cleared by the [place] FDA and  has been authorized for detection and/or diagnosis of SARS-CoV-2 by FDA under an Emergency Use Authorization (EUA). This EUA will remain  in effect (meaning this test can be used) for the duration of the COVID-19 declaration under Se ction 564(b)(1) of the Act, 21 U.S.C. section  360bbb-3(b)(1), unless the authorization is terminated or revoked sooner.  Performed at [facility], [place] [address], [place], [place] [address]          Radiology Studies: No results found.   Scheduled Meds:  amLODipine  5 mg Oral Daily   aspirin EC  81 mg Oral Daily   cholecalciferol  2,000 Units Oral Daily   docusate sodium  100 mg Oral BID   enoxaparin (LOVENOX) injection  30 mg Subcutaneous QHS   escitalopram  5 mg Oral Daily   feeding supplement  237 mL Oral TID BM   multivitamin with minerals  1 tablet Oral Daily   nicotine  21 mg Transdermal Daily   polyethylene glycol  17 g Oral Daily   pravastatin  20 mg Oral Daily   senna-docusate  1 tablet Oral QHS   sodium chloride flush  3 mL Intravenous Q12H   cyanocobalamin  1,000 mcg Oral Daily   Continuous Infusions:   LOS: 2 days     Flora Lipps, MD [facility] 08/19/2020, 1:26 PM

## 2020-08-19 NOTE — TOC Progression Note (Signed)
Transition of Care [facility]) - Progression Note    Patient Details  Name: Sandra Sheppard MRN: [id number] Date of Birth: 1946-01-14  [facility]) CM/SW [place], [place] Phone Number: 08/19/2020, 1:37 PM  Clinical Narrative:     CSW contacted [facility] admissions director who informed CSW that [name] is requesting a peer to peer with the provider. The number fot the MD to call is [phone]. There was no time frame given but its usually asked to be done within 24 hours. CSW gave this information to Md via secure chat.   CSW will follow for updates.   Expected Discharge Plan: [place] Barriers to Discharge: Continued Medical Work up  Expected Discharge Plan and Services Expected Discharge Plan: [place] In-house Referral: Clinical Social Work     Living arrangements for the past 2 months: Single Family Home                                       Social Determinants of Health (SDOH) Interventions    Readmission Risk Interventions No flowsheet data found.  Emeterio Reeve, Latanya Presser, [place] Social Worker [phone]

## 2020-08-19 NOTE — Progress Notes (Signed)
Physical Therapy Treatment Patient Details Name: Sandra Sheppard MRN: [id number] DOB: 07/21/1945 Today's Date: 08/19/2020    History of Present Illness Pt adm 3/3 with near syncopal episode and falling in to the bushes when walking her dog. +orthostatics in ED. MRI and CT negative for acute changes. Pt with confusion. Pt with  PMH - AAA, ckd, dm, htn, pvd,paf, avr.    PT Comments    Continuing work on functional mobility and activity tolerance;  Pt with persistent R lean, and occasional posterior lean during amb and upright activities, putting her at significant risk for a fall; Needs mod assist for balance during amb, which represents quite a functional decline, compared to walking her dog independently prior to this admmission; see below or doc flowsheets for BPs pre and post amb   Follow Up Recommendations  SNF (if pt refuses [facility] services)     Equipment Recommendations  Other (comment) (Rollator RW)    Recommendations for Other Services   Consider a Vestibular eval    Precautions / Restrictions Precautions Precautions: Fall;Other (comment) Precaution Comments: monitor orthostatic BPs    Mobility  Bed Mobility Overal bed mobility: Needs Assistance Bed Mobility: Supine to Sit     Supine to sit: Min assist;HOB elevated     General bed mobility comments: Min A to get EOB, consistent cues needed to initiate and sequence, as well as to complete the task    Transfers Overall transfer level: Needs assistance Equipment used: Rolling walker (2 wheeled) Transfers: Sit to/from Stand Sit to Stand: Min assist         General transfer comment: Min A for power up and to maintain standing. Pt with consistent R lateral and posterior bias requiring hands on asssist throughout for transfer  Ambulation/Gait Ambulation/Gait assistance: Mod assist Gait Distance (Feet): 90 Feet Assistive device: Rolling walker (2 wheeled) Gait Pattern/deviations: Step-through pattern;Decreased  stride length;Leaning posteriorly;Drifts right/left Gait velocity: decr   General Gait Details: Multimodal cues for weight shifting full onto stance leg; Tendency to R lean and R drift throughout session, and occasional scissoring; no reports of swimmy-headedness when asked   Stairs             Wheelchair Mobility    Modified Rankin (Stroke Patients Only)       Balance Overall balance assessment: Needs assistance   Sitting balance-Leahy Scale: Fair       Standing balance-Leahy Scale: Poor                              Cognition Arousal/Alertness: Awake/alert Behavior During Therapy: WFL for tasks assessed/performed Overall Cognitive Status: Impaired/Different from baseline Area of Impairment: Orientation;Attention;Memory;Following commands;Safety/judgement;Awareness;Problem solving                 Orientation Level: Situation Current Attention Level: Sustained Memory: Decreased short-term memory Following Commands: Follows one step commands consistently Safety/Judgement: Decreased awareness of safety;Decreased awareness of deficits Awareness: Emergent Problem Solving: Requires verbal cues;Requires tactile cues General Comments: Needed cues and incr time to recognize her sisters      Exercises      General Comments General comments (skin integrity, edema, etc.):   08/19/20 1300 08/19/20 1310  Vital Signs  Pulse Rate 75 81  BP 106/85 (MAP 92) 92/67 (MAP 76)  Patient Position (if appropriate) Standing Standing (After walking in the hallway)        Pertinent Vitals/Pain Pain Assessment: No/denies pain    Home Living  Prior Function            PT Goals (current goals can now be found in the care plan section) Acute Rehab PT Goals Patient Stated Goal: get home PT Goal Formulation: With patient/family Time For Goal Achievement: 08/29/20 Potential to Achieve Goals: Good Progress towards PT goals:  Progressing toward goals    Frequency    Min 3X/week      PT Plan Current plan remains appropriate    Co-evaluation              AM-PAC PT "6 Clicks" Mobility   Outcome Measure  Help needed turning from your back to your side while in a flat bed without using bedrails?: None Help needed moving from lying on your back to sitting on the side of a flat bed without using bedrails?: A Little Help needed moving to and from a bed to a chair (including a wheelchair)?: A Little Help needed standing up from a chair using your arms (e.g., wheelchair or bedside chair)?: A Little Help needed to walk in hospital room?: A Lot Help needed climbing 3-5 steps with a railing? : A Lot 6 Click Score: 17    End of Session Equipment Utilized During Treatment: Gait belt Activity Tolerance: Patient tolerated treatment well Patient left: in bed;with call bell/phone within reach;with bed alarm set Nurse Communication: Mobility status PT Visit Diagnosis: Unsteadiness on feet (R26.81);Other abnormalities of gait and mobility (R26.89);Muscle weakness (generalized) (M62.81)     Time: [id number] PT Time Calculation (min) (ACUTE ONLY): 37 min  Charges:  $Gait Training: 23-37 mins                     Roney Marion, [place]  Acute Rehabilitation Services Pager [phone] Office [place] 08/19/2020, 4:16 PM

## 2020-08-20 DIAGNOSIS — R55 Syncope and collapse: Secondary | ICD-10-CM | POA: Diagnosis not present

## 2020-08-20 DIAGNOSIS — N184 Chronic kidney disease, stage 4 (severe): Secondary | ICD-10-CM | POA: Diagnosis not present

## 2020-08-20 DIAGNOSIS — E785 Hyperlipidemia, unspecified: Secondary | ICD-10-CM | POA: Diagnosis not present

## 2020-08-20 DIAGNOSIS — E119 Type 2 diabetes mellitus without complications: Secondary | ICD-10-CM | POA: Diagnosis not present

## 2020-08-20 LAB — CREATININE, SERUM
Creatinine, Ser: 2.58 mg/dL — ABNORMAL HIGH (ref 0.44–1.00)
GFR, Estimated: 19 mL/min — ABNORMAL LOW (ref >60)

## 2020-08-20 NOTE — Care Management Important Message (Signed)
Important Message  Patient Details  Name: Sandra Sheppard MRN: [id number] Date of Birth: May 29, 1946   Medicare Important Message Given:  Yes     [name] [name] 08/20/2020, 1:48 PM

## 2020-08-20 NOTE — Progress Notes (Signed)
Nutrition Follow-up  DOCUMENTATION CODES:   Severe malnutrition in context of chronic illness  INTERVENTION:  Continue Ensure Enlive po TID, each supplement provides 350 kcal and 20 grams of protein  NUTRITION DIAGNOSIS:   Severe Malnutrition related to chronic illness (CKD, cardiac illness) as evidenced by severe muscle depletion,severe fat depletion. --ongoing  GOAL:   Patient will meet greater than or equal to 90% of their needs--progressing  MONITOR:   PO intake,Supplement acceptance  REASON FOR ASSESSMENT:   Consult Assessment of nutrition requirement/status  ASSESSMENT:   75 yo female admitted with presyncopal episode. PMH includes mitral valve stenosis, PVD, HTN, HLD, A fib, CKD, CVD, osteoporosis, DM, smoker.  Pt pending discharge. PT/OT recommended pt go to SNF, but this was denied by insurance. MD attempting to appeal, but pt likely to discharge home with home health.  PO intake: 0-100% x last 8 recorded meals (53% average meal intake). Pt is receiving Ensure TID and typically does well with the supplements per RN. Continue current nutrition plan of care.   UOP: 185m documented x24 hours  Medications: vitamin D3, colace, mvi, miralax, senokot-s, vitamin b12 Labs reviewed. Sodium was 132 yesterday, no updated lab today.   Diet Order:   Diet Order            Diet regular Room service appropriate? Yes; Fluid consistency: Thin  Diet effective now                 EDUCATION NEEDS:   Education needs have been addressed (discussed maximizing protein and calorie intake with patient's family)  Skin:  Skin Assessment: Reviewed RN Assessment  Last BM:  3/10  Height:   Ht Readings from Last 1 Encounters:  08/13/20 5' 5.5" (1.664 m)    Weight:   Wt Readings from Last 1 Encounters:  08/18/20 50.8 kg    Ideal Body Weight:  58 kg  BMI:  Body mass index is 18.35 kg/m.  Estimated Nutritional Needs:   Kcal:  1550-1750  Protein:  70-80 gm  Fluid:   >/= 1.5 L  ALarkin Ina MS, RD, LDN RD pager number and weekend/on-call pager number located in [facility]

## 2020-08-20 NOTE — Progress Notes (Addendum)
PROGRESS NOTE    KAMIYLAH MEECE  [id number] DOB: Aug 19, 1945 DOA: 08/13/2020 PCP: Prince Solian, MD    Brief Narrative:   Sandra Sheppard is a 75 year old female with past medical history significant for AAA, paroxysmal atrial fibrillation, carotid artery stenosis, aortic/mitral valve stenosis, CKD stage IV, type 2 diabetes mellitus, hyperlipidemia, essential hypertension, peripheral vascular disease, osteoporosis who presented to the ED following presyncopal episode. Patient reported being lightheaded, off balance and nearly passing out while walking her dog in her neighborhood on 08/13/2020. Patient was assisted to her feet by a neighbor and EMS was activated and subsequently taken to the ED. In the ED, vital signs were stable, on room air.  Patient had mild hyponatremia with elevated creatinine levels.  Chest x-ray with no active cardiopulmonary disease process.  Troponins were negative.  Patient was then admitted to hospital for further evaluation and treatment.  At this time, patient has been awaiting for skilled nursing facility placement but has been declined by [facility].   Assessment & Plan:   Principal Problem:   Near syncope Active Problems:   Paroxysmal atrial fibrillation (HCC)   Dyslipidemia   Normocytic anemia   Diabetes mellitus type II, non insulin dependent (HCC)   CKD (chronic kidney disease) stage 4, GFR 15-29 ml/min (HCC)   Syncope and collapse   Protein-calorie malnutrition, severe  Near syncope likely secondary to orthostatic hypotension. No loss of consciousness or seizure-like activity. CT head/C-spine without contrast with no acute intracranial abnormality but hematoma of the right forehead noted.  2D echocardiogram was performed which showed preserved LV function with diastolic dysfunction and bioprosthetic valve.  Antihypertensives have been started at a reduced dose.  Continue physical therapy occupational therapy.  Insurance has declined a skilled  nursing facility placement.  Will likely need home health on discharge.  Acute kidney injury on CKD stage IV: Resolved Creatinine 2.54 on admission.  Acute kidney injury was thought to be secondary to volume depletion.  Improved with IV fluids.  Latest creatinine of 2.2.  Continue to monitor BMP.  Paroxysmal atrial fibrillation Not a good candidate for anticoagulation.  Normal sinus rhythm.   Essential hypertension On  amlodipine '10mg'$  daily at home..  Patient was noted to be orthostatic on admission. Continue Amlodipine 5 mg will consider reduced dose on discharge.  Type 2 diabetes mellitus Hemoglobin A1C on 08/16/2020 was 7.1.  Continue sliding scale insulin, Accu-Cheks, diabetic diet.  Hx CVA Continue aspirin and statin  Hyperlipidemia: Continue statin  Anxiety/depression Continue Lexapro  Severe protein calorie malnutrition As evidenced by severe muscle depletion, fat depletion, present on admission.  Continue supplements.  Tobacco use disorder Heavy smoker at home.  Currently on nicotine patch  Cognitive impairment CT scan MRI showed cerebral atrophy noted on imaging study with prior history of CVA and microvascular ischemic changes.    Aortic atherosclerosis Continue aspirin and statin  Debility, deconditioning, poor endurance.  Patient was seen by physical therapy recommended skilled nursing facility placement on discharge.  We will continue physical therapy while in the hospital .  Patient is very unstable for disposition and family is very concerned about her disposition.  I have called the insurance company for expedited appeal.  Patient will benefit from physical therapy at the skilled nursing facility level on discharge  DVT prophylaxis: Lovenox subcu    Code Status: Full Code   Family Communication:  Patient's sister Ms. Marcie Bal on the phone and updated her about the clinical condition of the patient and potential plan  for disposition.  Disposition Plan:  Status  is: Inpatient  The patient is inpatient due to treatments appropriate due to intensity of illness or inability to take PO, need for rehabilitation  Dispo: The patient is from: Home              Anticipated d/c is to: SNF per PT but likely home with home health              Patient currently is medically stable to d/c.   Difficult to place patient No   Consultants:   none  Procedures:   TEE  Antimicrobials:   none  Subjective: Today, patient was seen and examined at bedside.  Patient denies any chest pain, shortness of breath, fever, chills or rigor.  Lives alone at home  Objective: Vitals:   08/19/20 1830 08/19/20 2120 08/20/20 0501 08/20/20 1112  BP: (!) 151/81 127/70 (!) 144/62 121/61  Pulse: 73 71 (!) 55 63  Resp: '14 18 16 16  '$ Temp: 98 F (36.7 C) 98.7 F (37.1 C) 97.7 F (36.5 C)   TempSrc:      SpO2: 95% 95% 95% 94%  Weight:      Height:        Intake/Output Summary (Last 24 hours) at 08/20/2020 1404 Last data filed at 08/20/2020 1300 Gross per 24 hour  Intake 1000 ml  Output 150 ml  Net 850 ml   Filed Weights   08/16/20 0501 08/17/20 0550 08/18/20 2042  Weight: 51 kg 50.9 kg 50.8 kg    Physical examination: General: Thinly built, not in obvious distress muscle wasting noted HENT:   No scleral pallor or icterus noted. Oral mucosa is moist.  Chest:  Clear breath sounds.  Diminished breath sounds bilaterally. No crackles or wheezes.  CVS: S1 &S2 heard. No murmur.  Regular rate and rhythm. Abdomen: Soft, nontender, nondistended.  Bowel sounds are heard.   Extremities: No cyanosis, clubbing or edema.  Peripheral pulses are palpable. Psych: Alert, awake and oriented, poor insight CNS:  No cranial nerve deficits.  Power equal in all extremities.   Skin: Warm and dry.  No rashes noted.   Data Reviewed: I have personally reviewed following labs and imaging studies  CBC: Recent Labs  Lab 08/13/20 1640 08/14/20 0235 08/16/20 0646  WBC 6.3 8.8 6.3   NEUTROABS 4.2  --   --   HGB 11.9* 12.8 12.6  HCT 36.8 39.9 36.0  MCV 89.3 88.5 84.7  PLT 174 180 [id number]   Basic Metabolic Panel: Recent Labs  Lab 08/15/20 0804 08/16/20 0646 08/17/20 0716 08/18/20 0716 08/19/20 0143 08/20/20 0539  NA 135 132* 131* 132* 132*  --   K 4.4 4.6 4.8 5.0 4.4  --   CL 104 102 101 100 101  --   CO2 22 20* 20* 25 20*  --   GLUCOSE 143* 160* 158* 148* 118*  --   BUN 18 25* 31* 34* 37*  --   CREATININE 1.98* 2.09* 2.55* 2.45* 2.25* 2.58*  CALCIUM 9.2 9.1 9.0 9.2 8.9  --    GFR: Estimated Creatinine Clearance: 15.1 mL/min (A) (by C-G formula based on SCr of 2.58 mg/dL (H)). Liver Function Tests: Recent Labs  Lab 08/13/20 1640  AST 14*  ALT 8  ALKPHOS 84  BILITOT 0.6  PROT 6.5  ALBUMIN 3.4*   No results for input(s): LIPASE, AMYLASE in the last 168 hours. Recent Labs  Lab 08/14/20 1603  AMMONIA 11   Coagulation Profile: Recent  Labs  Lab 08/13/20 1640  INR 1.1   Cardiac Enzymes: No results for input(s): CKTOTAL, CKMB, CKMBINDEX, TROPONINI in the last 168 hours. BNP (last 3 results) No results for input(s): PROBNP in the last 8760 hours. HbA1C: No results for input(s): HGBA1C in the last 72 hours. CBG: Recent Labs  Lab 08/14/20 0455 08/15/20 0620 08/16/20 0605 08/18/20 0833 08/19/20 0648  GLUCAP 148* 151* 160* 149* 140*   Lipid Profile: No results for input(s): CHOL, HDL, LDLCALC, TRIG, CHOLHDL, LDLDIRECT in the last 72 hours. Thyroid Function Tests: No results for input(s): TSH, T4TOTAL, FREET4, T3FREE, THYROIDAB in the last 72 hours. Anemia Panel: No results for input(s): VITAMINB12, FOLATE, FERRITIN, TIBC, IRON, RETICCTPCT in the last 72 hours. Sepsis Labs: No results for input(s): PROCALCITON, LATICACIDVEN in the last 168 hours.  Recent Results (from the past 240 hour(s))  SARS CORONAVIRUS 2 (TAT 6-24 HRS) Nasopharyngeal Nasopharyngeal Swab     Status: None   Collection Time: 08/14/20  8:49 AM   Specimen:  Nasopharyngeal Swab  Result Value Ref Range Status   SARS Coronavirus 2 NEGATIVE NEGATIVE Final    Comment: (NOTE) SARS-CoV-2 target nucleic acids are NOT DETECTED.  The SARS-CoV-2 RNA is generally detectable in upper and lower respiratory specimens during the acute phase of infection. Negative results do not preclude SARS-CoV-2 infection, do not rule out co-infections with other pathogens, and should not be used as the sole basis for treatment or other patient management decisions. Negative results must be combined with clinical observations, patient history, and epidemiological information. The expected result is Negative.  Fact Sheet for Patients: SugarRoll.be  Fact Sheet for Healthcare Providers: https://www.woods-mathews.com/  This test is not yet approved or cleared by the [place] FDA and  has been authorized for detection and/or diagnosis of SARS-CoV-2 by FDA under an Emergency Use Authorization (EUA). This EUA will remain  in effect (meaning this test can be used) for the duration of the COVID-19 declaration under Se ction 564(b)(1) of the Act, 21 U.S.C. section 360bbb-3(b)(1), unless the authorization is terminated or revoked sooner.  Performed at [facility], [place] [address], [place], [place] [address]   SARS CORONAVIRUS 2 (TAT 6-24 HRS) Nasopharyngeal Nasopharyngeal Swab     Status: None   Collection Time: 08/17/20  1:25 PM   Specimen: Nasopharyngeal Swab  Result Value Ref Range Status   SARS Coronavirus 2 NEGATIVE NEGATIVE Final    Comment: (NOTE) SARS-CoV-2 target nucleic acids are NOT DETECTED.  The SARS-CoV-2 RNA is generally detectable in upper and lower respiratory specimens during the acute phase of infection. Negative results do not preclude SARS-CoV-2 infection, do not rule out co-infections with other pathogens, and should not be used as the sole basis for treatment or other patient management  decisions. Negative results must be combined with clinical observations, patient history, and epidemiological information. The expected result is Negative.  Fact Sheet for Patients: SugarRoll.be  Fact Sheet for Healthcare Providers: https://www.woods-mathews.com/  This test is not yet approved or cleared by the [place] FDA and  has been authorized for detection and/or diagnosis of SARS-CoV-2 by FDA under an Emergency Use Authorization (EUA). This EUA will remain  in effect (meaning this test can be used) for the duration of the COVID-19 declaration under Se ction 564(b)(1) of the Act, 21 U.S.C. section 360bbb-3(b)(1), unless the authorization is terminated or revoked sooner.  Performed at [facility], [place] [address], [place], [place] [address]  Radiology Studies: No results found.   Scheduled Meds: . amLODipine  5 mg Oral Daily  . aspirin EC  81 mg Oral Daily  . cholecalciferol  2,000 Units Oral Daily  . docusate sodium  100 mg Oral BID  . enoxaparin (LOVENOX) injection  30 mg Subcutaneous QHS  . escitalopram  5 mg Oral Daily  . feeding supplement  237 mL Oral TID BM  . multivitamin with minerals  1 tablet Oral Daily  . nicotine  21 mg Transdermal Daily  . polyethylene glycol  17 g Oral Daily  . pravastatin  20 mg Oral Daily  . senna-docusate  1 tablet Oral QHS  . sodium chloride flush  3 mL Intravenous Q12H  . cyanocobalamin  1,000 mcg Oral Daily   Continuous Infusions:   LOS: 3 days     Flora Lipps, MD [facility] 08/20/2020, 2:04 PM

## 2020-08-20 NOTE — Progress Notes (Signed)
PT Cancellation Note  Patient Details Name: Sandra Sheppard MRN: [id number] DOB: 15-Nov-1945   Cancelled Treatment:    Reason Eval/Treat Not Completed: Patient at procedure or test/unavailable  Requested by MD to see patient this p m. Re: her discharge plan (refusing SNF). Patient currently being bathed by nurse tech due to episode of incontinence.   Will see another patient and return this pm to assess.   Arby Barrette, PT Pager [phone]   Rexanne Mano 08/20/2020, 2:40 PM

## 2020-08-20 NOTE — TOC Progression Note (Signed)
Transition of Care [facility]) - Progression Note    Patient Details  Name: Sandra Sheppard MRN: [id number] Date of Birth: 05-Apr-1946  Transition [facility]) CM/SW Contact  Bartholomew Crews, RN Phone Number: [phone] 08/20/2020, 4:16 PM  Clinical Narrative:     Spoke with patient at the bedside to discuss transition planning. Discussed Medicare guidelines for short-term skilled nursing facility vs home health skilled care. Advised that per Medicare guidelines that the need for 24/7 care is not covered under Medicare, but it is the specific level of need for skilled therapy. Discussed that the need for 24/7 care is long term care which is covered by private pay or Medicaid. Discussed that it is her right to call the insurance company for further explanation and appeal the decision, but this had to be done by her and not the hospital.   Patient stated that she lives alone with her small dog, Zoe. She stated that she cannot stay with any of her family, but she thinks her sister may be able to stay with her short term.   Verified PCP in Epic. Patient stated that her family assists her with transportation to medical appointments or shopping as needed.   Preferred pharmacy verified in [place]. Family typically picks up her prescriptions for her.   Patient stated that she cooks for herself.   Patient stated that she has a cane and a bedside commode. Discussed recommendations for RW - patient agreeable. Patient will need DME - RW order.   Discussed [place] arrangements with [facility] for PT, OT, and SW. Start of care to be delayed 4-5 days d/t staffing. Patient will need HH order for PT, OT, SW with Face to Face at discharge.   Patient stated that family to provide transportation home at discharge.   TOC following for transition needs.     Expected Discharge Plan: [place] Barriers to Discharge: Continued Medical Work up  Expected Discharge Plan and Services Expected Discharge Plan:  [place] In-house Referral: Clinical Social Work     Living arrangements for the past 2 months: Single Family Home                                       Social Determinants of Health (SDOH) Interventions    Readmission Risk Interventions No flowsheet data found.

## 2020-08-20 NOTE — Progress Notes (Signed)
Physical Therapy Treatment Patient Details Name: Sandra Sheppard MRN: [id number] DOB: 12/06/1945 Today's Date: 08/20/2020    History of Present Illness Pt adm 3/3 with near syncopal episode and falling in to the bushes when walking her dog. +orthostatics in ED. 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 Pt with confusion.  PMH - AAA, ckd, dm, htn, pvd,paf, avr.    PT Comments    Patient seen for vestibular assessment due to continued significant rt lean with all mobility and positioning. (On arrival, pt leaning to right in chair...almost to the point of falling over). Patient had 2 major losses of balance during therapy which would have resulted in falling if PT had not caught her. She has a right lean in all positions (sitting, standing and walking) and agreed she feels like something is pulling her over to her right. She noted with one loss of balance that she felt spinning and had to sit down. Patient checked her for BPPV in all canals and tests were negative for nystagmus, although some movements caused spinning, pt reported it appeared the ceiling tiles were moving, and double-vision. She had saccadic eye movements when doing smooth pursuits and the smooth pursuits made her feel nauseated.  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 Repeat MRI of brain is planned per MD.      Follow Up Recommendations  SNF (pt now realizes she needs time with therapy prior to home)     Equipment Recommendations  Rolling walker with 5" wheels;3in1 (PT)    Recommendations for Other Services       Precautions / Restrictions Precautions Precautions: Fall;Other (comment) Precaution Comments: monitor orthostatic BPs     Vestibular Assessment      08/20/20 0001  Vestibular Assessment  General Observation rt lean in sitting, standing, walking  Symptom Behavior  Subjective history of current problem began after her  recent fall (?caused her recent fall)  Type of Dizziness  Imbalance;Spinning;"World moves";Unsteady with head/body turns  Frequency of Dizziness with mobility  Duration of Dizziness minutes  Symptom Nature Motion provoked  Aggravating Factors Lying supine;Moving eyes;Turning body quickly;Supine to sit;Sit to stand  Relieving Factors Head stationary;Rest  Progression of Symptoms No change since onset  History of similar episodes pt describes h/o of fall and hitting her forehead in the past and was told she had problem with 2 tubes in her ears (symptoms sound like BPPV); she says this feels similar, but worse  Oculomotor Exam  Oculomotor Alignment Normal  Spontaneous Absent  Smooth Pursuits Saccades  Comment pt became nauseated with smooth pursuits; left eye dysconjugate and saccadic movements  Positional Testing  Sidelying Test Sidelying Right;Sidelying Left  Horizontal Canal Testing Horizontal Canal Right;Horizontal Canal Left  Sidelying Right  Sidelying Right Duration 0  Sidelying Right Symptoms No nystagmus  Sidelying Left  Sidelying Left Duration 0  Sidelying Left Symptoms No nystagmus  Horizontal Canal Right  Horizontal Canal Right Duration 0  Horizontal Canal Right Symptoms Normal  Horizontal Canal Left  Horizontal Canal Left Duration 0  Horizontal Canal Left Symptoms Normal  Positional Sensitivities  Supine to Sitting 3  Head Turning x 5 3  Pivot Right in Standing 4   Mobility  Bed Mobility Overal bed mobility: Needs Assistance Bed Mobility: Supine to Sit     Supine to sit: Min assist Sit to supine: Supervision   General bed mobility comments: Min A to get EOB,  consistent cues needed to initiate and sequence, as well as to complete the task; reports spinning sensation    Transfers Overall transfer level: Needs assistance Equipment used: Rolling walker (2 wheeled) Transfers: Sit to/from Stand Sit to Stand: Min assist         General transfer comment: Min A  for power up and to maintain standing. Pt with consistent R lateral and posterior bias requiring hands on asssist throughout for transfer  Ambulation/Gait Ambulation/Gait assistance: Mod assist Gait Distance (Feet): 90 Feet Assistive device: Rolling walker (2 wheeled) Gait Pattern/deviations: Step-through pattern;Decreased stride length;Leaning posteriorly;Drifts right/left;Narrow base of support Gait velocity: decr   General Gait Details: Multimodal cues for weight shifting full onto left stance leg; Tendency to R lean and R drift throughout session; 2 losses of balance to her right requiring mod assist to prevent fall   Stairs             Wheelchair Mobility    Modified Rankin (Stroke Patients Only)       Balance Overall balance assessment: Needs assistance Sitting-balance support: Single extremity supported;Bilateral upper extremity supported;No upper extremity supported;Feet supported Sitting balance-Leahy Scale: Fair   Postural control: Right lateral lean;Posterior lean Standing balance support: Bilateral upper extremity supported;During functional activity Standing balance-Leahy Scale: Poor Standing balance comment: reliant on UE support and external support to maintain standing               High Level Balance Comments: weight shifted over her rt foot and when cued can wt-shift to midline but immediately drifts back over RLE Standardized Balance Assessment Standardized Balance Assessment : Berg Balance Test Berg Balance Test Sit to Stand: Needs minimal aid to stand or to stabilize Standing Unsupported: Unable to stand 30 seconds unassisted Sitting with Back Unsupported but Feet Supported on Floor or Stool: Able to sit 2 minutes under supervision Stand to Sit: Needs assistance to sit Transfers: Needs one person to assist Standing Unsupported with Eyes Closed: Able to stand 10 seconds with supervision Standing Ubsupported with Feet Together: Needs help to  attain position and unable to hold for 15 seconds From Standing, Reach Forward with Outstretched Arm: Loses balance while trying/requires external support From Standing Position, Pick up Object from Floor: Unable to try/needs assist to keep balance From Standing Position, Turn to Look Behind Over each Shoulder: Needs assist to keep from losing balance and falling Turn 360 Degrees: Needs assistance while turning Standing Unsupported, Alternately Place Feet on Step/Stool: Needs assistance to keep from falling or unable to try Standing Unsupported, One Foot in Front: Loses balance while stepping or standing Standing on One Leg: Unable to try or needs assist to prevent fall Total Score: 8        Cognition Arousal/Alertness: Awake/alert Behavior During Therapy: Flat affect;Anxious Overall Cognitive Status: No family/caregiver present to determine baseline cognitive functioning Area of Impairment: Memory;Following commands;Safety/judgement;Awareness;Problem solving                 Orientation Level:  (NT) Current Attention Level: Sustained Memory: Decreased short-term memory Following Commands: Follows one step commands inconsistently (required repetition of instructions numerous times) Safety/Judgement: Decreased awareness of safety;Decreased awareness of deficits Awareness: Emergent Problem Solving: Requires verbal cues;Requires tactile cues;Slow processing;Difficulty sequencing General Comments: needed repetition of instructions and she still would get things in the wrong sequence or turn the wrong way      Exercises      General Comments        Pertinent Vitals/Pain Pain Assessment: Faces Faces  Pain Scale: No hurt    Home Living                      Prior Function            PT Goals (current goals can now be found in the care plan section) Acute Rehab PT Goals Patient Stated Goal: get home Time For Goal Achievement: 08/29/20 Potential to Achieve Goals:  Good Progress towards PT goals: Progressing toward goals    Frequency    Min 3X/week      PT Plan Current plan remains appropriate    Co-evaluation              AM-PAC PT "6 Clicks" Mobility   Outcome Measure  Help needed turning from your back to your side while in a flat bed without using bedrails?: None Help needed moving from lying on your back to sitting on the side of a flat bed without using bedrails?: A Little Help needed moving to and from a bed to a chair (including a wheelchair)?: A Lot Help needed standing up from a chair using your arms (e.g., wheelchair or bedside chair)?: A Little Help needed to walk in hospital room?: A Lot Help needed climbing 3-5 steps with a railing? : A Lot 6 Click Score: 16    End of Session Equipment Utilized During Treatment: Gait belt Activity Tolerance: Patient tolerated treatment well Patient left: in bed;with call bell/phone within reach;with bed alarm set Nurse Communication: Mobility status PT Visit Diagnosis: Unsteadiness on feet (R26.81);Other abnormalities of gait and mobility (R26.89);Muscle weakness (generalized) (M62.81)     Time: [id number] PT Time Calculation (min) (ACUTE ONLY): 46 min  Charges:  $Gait Training: 8-22 mins $Therapeutic Activity: 8-22 mins                      Arby Barrette, PT Pager [phone]    Sandra Sheppard 08/20/2020, 4:39 PM

## 2020-08-21 ENCOUNTER — Inpatient Hospital Stay (HOSPITAL_COMMUNITY): Payer: Medicare HMO

## 2020-08-21 DIAGNOSIS — E785 Hyperlipidemia, unspecified: Secondary | ICD-10-CM | POA: Diagnosis not present

## 2020-08-21 DIAGNOSIS — E119 Type 2 diabetes mellitus without complications: Secondary | ICD-10-CM | POA: Diagnosis not present

## 2020-08-21 DIAGNOSIS — R55 Syncope and collapse: Secondary | ICD-10-CM | POA: Diagnosis not present

## 2020-08-21 DIAGNOSIS — N184 Chronic kidney disease, stage 4 (severe): Secondary | ICD-10-CM | POA: Diagnosis not present

## 2020-08-21 IMAGING — MR MR HEAD W/O CM
12 of 13 series · 44 of 48 positions shown · non-contrast
Comparison: [DATE]

CLINICAL DATA: Dizziness

EXAM:
MRI HEAD WITHOUT CONTRAST
TECHNIQUE: Multiplanar, multiecho pulse sequences of the brain and surrounding
structures were obtained without intravenous contrast.

[Series 5: DWI · axial · 3.0mm · 0.88mm/px · z∈[-92,+53]mm · 8 of 100 slices shown (1 of 4)]
[im 1/100]
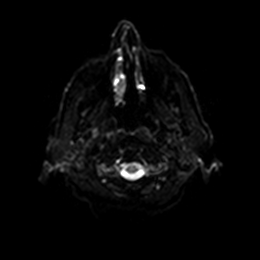
[im 15/100]
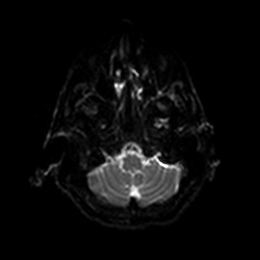
[im 29/100]
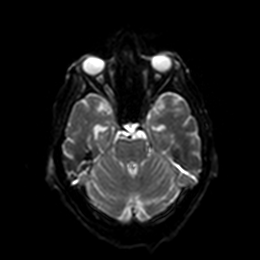
[im 43/100]
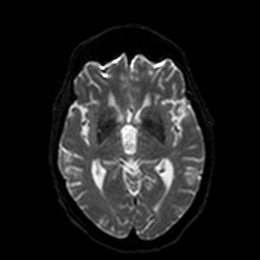
[im 57/100]
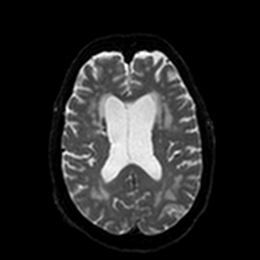
[im 71/100]
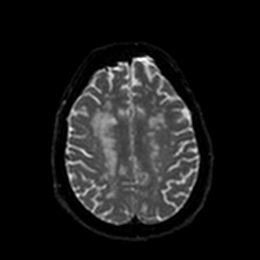
[im 85/100]
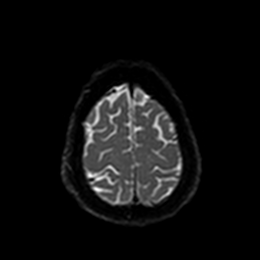
[im 100/100]
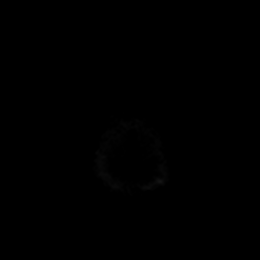

[Series 6: DWI · axial · 3.0mm · 0.88mm/px · z∈[-92,+53]mm · 4 of 50 slices shown (2 of 4)]
[im 1/50]
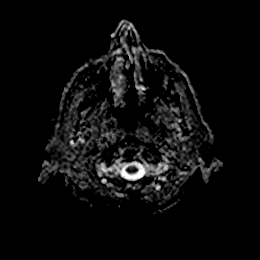
[im 17/50]
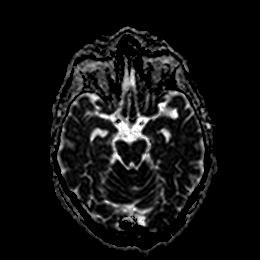
[im 33/50]
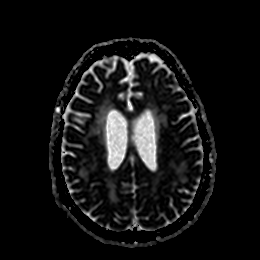
[im 50/50]
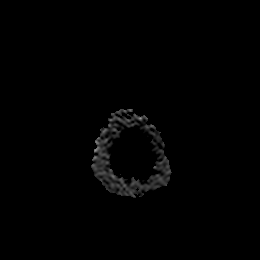

[Series 7: DWI · coronal · 4.0mm · 0.88mm/px · 5 of 68 slices shown (3 of 4)]
[im 1/68]
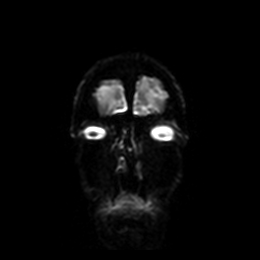
[im 17/68]
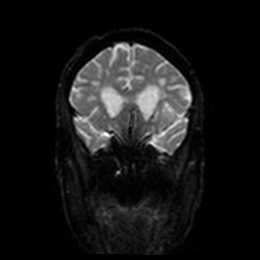
[im 34/68]
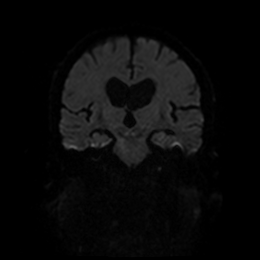
[im 51/68]
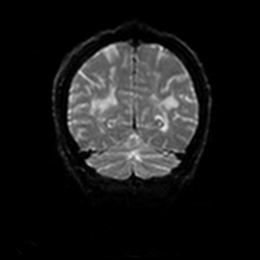
[im 68/68]
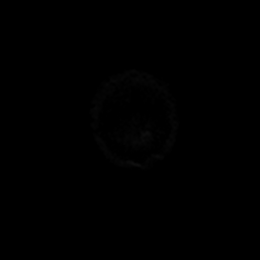

[Series 8: DWI · coronal · 4.0mm · 0.88mm/px · 3 of 34 slices shown (4 of 4)]
[im 1/34]
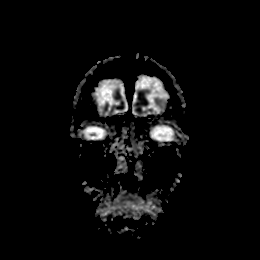
[im 17/34]
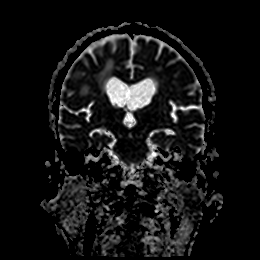
[im 34/34]
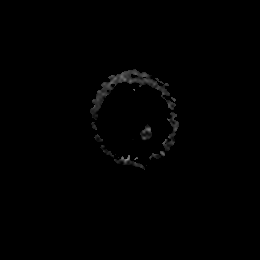

[Series 9: T1 · sagittal · 5.0mm · 0.75mm/px · 2 of 23 slices shown]
[im 1/23]
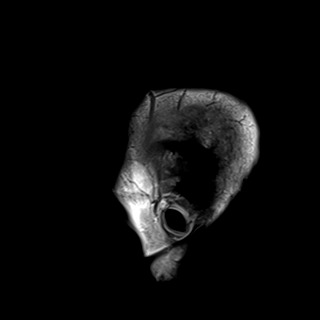
[im 23/23]
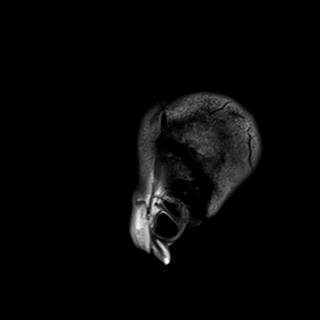

[Series 10: T2 · axial · 5.0mm · 0.72mm/px · z∈[-91,+51]mm · 2 of 25 slices shown (1 of 2)]
[im 1/25]
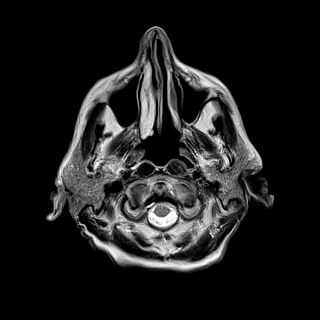
[im 25/25]
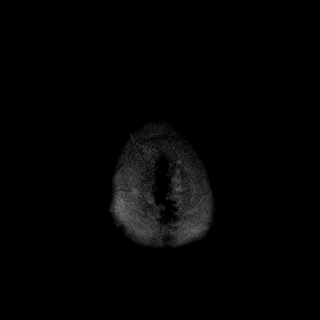

[Series 11: FLAIR · axial · 5.0mm · 0.45mm/px · z∈[-88,+54]mm · 2 of 25 slices shown]
[im 1/25]
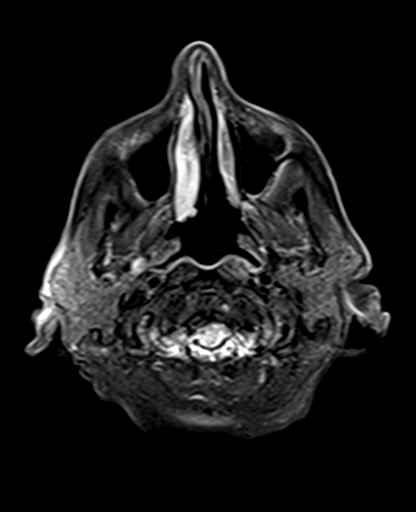
[im 25/25]
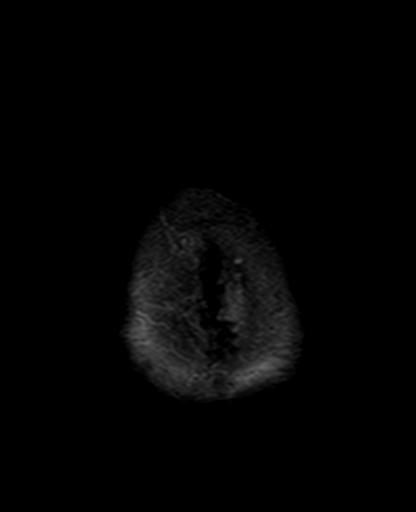

[Series 12: mag_images · axial · 3.0mm · 0.90mm/px · z∈[-93,+58]mm · 4 of 52 slices shown]
[im 1/52]
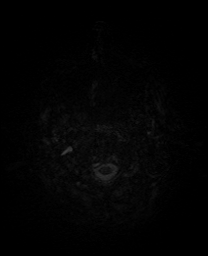
[im 18/52]
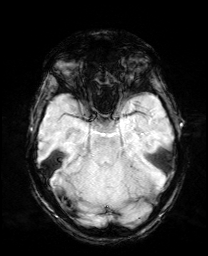
[im 35/52]
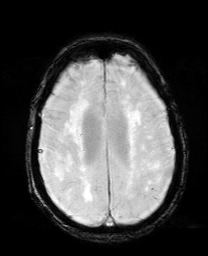
[im 52/52]
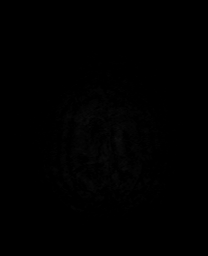

[Series 13: pha_images · axial · 3.0mm · 0.90mm/px · z∈[-90,+55]mm · 4 of 50 slices shown]
[im 1/50]
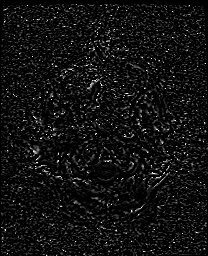
[im 17/50]
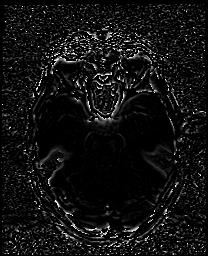
[im 33/50]
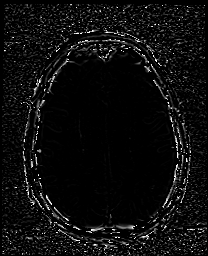
[im 50/50]
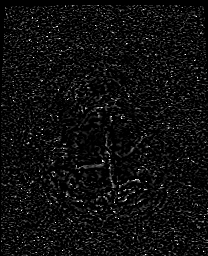

[Series 14: swi_images · axial · 3.0mm · 0.90mm/px · z∈[-93,+58]mm · 4 of 52 slices shown]
[im 1/52]
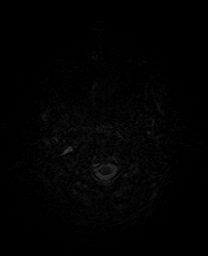
[im 18/52]
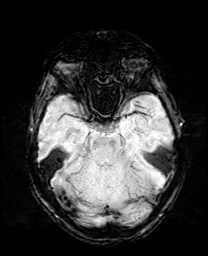
[im 35/52]
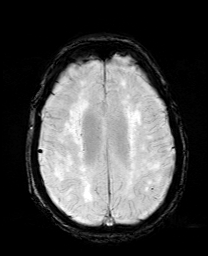
[im 52/52]
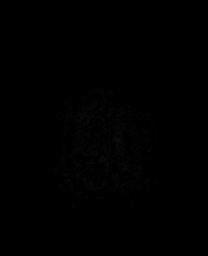

[Series 15: mip_images(sw) · axial · 24.0mm · 0.90mm/px · z∈[-82,+48]mm · 4 of 45 slices shown]
[im 1/45]
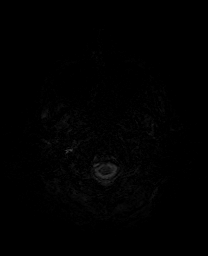
[im 15/45]
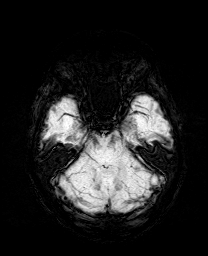
[im 30/45]
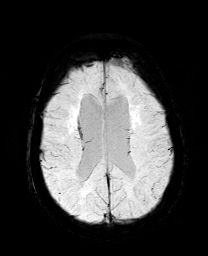
[im 45/45]
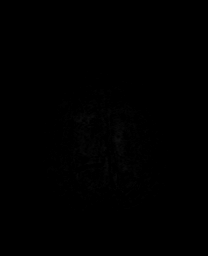

[Series 17: T2 · coronal · 5.0mm · 0.34mm/px · 2 of 29 slices shown (2 of 2)]
[im 1/29]
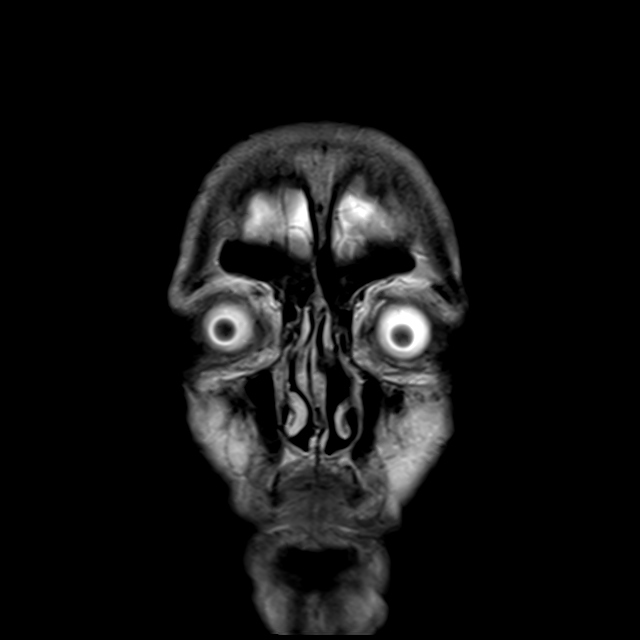
[im 29/29]
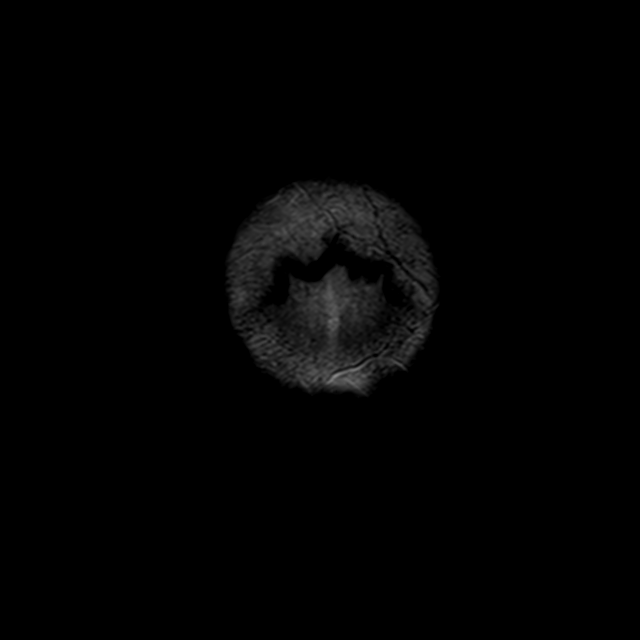

[44 of 48 positions shown; findings below may reference images not displayed]

FINDINGS: Brain: No acute infarct, mass effect or extra-axial collection.
Chronic microhemorrhage in the left cerebellum. Confluent
hyperintense T2-weighted white matter signal. Generalized volume
loss without a clear lobar predilection. The midline structures are
normal. Old small vessel infarct of the right basal ganglia and left
cerebellum.

Vascular: Major flow voids are preserved.

Skull and upper cervical spine: Normal calvarium and skull base.
Visualized upper cervical spine and soft tissues are normal.

Sinuses/Orbits:No paranasal sinus fluid levels or advanced mucosal
thickening. No mastoid or middle ear effusion. Normal orbits.
IMPRESSION: 1. No acute intracranial abnormality.
2. Confluent hyperintense T2-weighted white matter signal,
consistent with chronic microvascular ischemia.
3. Old small vessel infarcts of the right basal ganglia and left
cerebellum.

## 2020-08-21 NOTE — TOC Progression Note (Addendum)
Transition of Care [facility]) - Progression Note    Patient Details  Name: Sandra Sheppard MRN: [id number] Date of Birth: [birth year]  Transition of Care [facility]) CM/SW Contact  Bartholomew Crews, RN Phone Number: [phone] 08/21/2020, 2:53 PM  Clinical Narrative:     Advised by MD during progression rounds that expedited appeal of SNF denial had been initiated yesterday. Spoke with Helene Kelp at [place] who could see that a case was pending but could not provide additional information or advise if anything was needed to progress case. Helene Kelp did advise to follow up on Monday. Spoke with Loie at [facility] to make aware of pending appeal. [facility] still has available bed if authorization received. Spoke with patient at bedside to advise of appeal. TOC following for transition needs.   Expected Discharge Plan: [place] Barriers to Discharge: Continued Medical Work up  Expected Discharge Plan and Services Expected Discharge Plan: [place] In-house Referral: Clinical Social Work     Living arrangements for the past 2 months: Single Family Home                                       Social Determinants of Health (SDOH) Interventions    Readmission Risk Interventions No flowsheet data found.

## 2020-08-21 NOTE — Progress Notes (Signed)
Rehab Admissions Coordinator Note:  Patient was screened by Cleatrice Burke for appropriateness for an Inpatient Acute Rehab Consult per change in therapy recommendation. Clarified with RN CM that [facility] had denied SNF and expedited appeal is in process. It is unlikely that [facility] will approve CIR for intense CIR rehab for her diagnosis.  At this time, we are recommending other rehab venues to be pursued.Cleatrice Burke RN MSN 08/21/2020, 12:43 PM  I can be reached at [phone].

## 2020-08-21 NOTE — Progress Notes (Signed)
Occupational Therapy Progress Note  Patient appears to have poor problem solving/insight, upon arrival states to OT she is wet and has been trying to get herself out of bed vs calling nursing staff for assistance. Patient min A for bed mobility and functional ambulation with rolling walker to bathroom. Pt needs cues for problem solving and safety to use grab during toilet transfer. Pt able to doff/don clean mesh underwear and perform peri care seated on toilet, needing min A in standing for balance/safety to pull up underwear. Recommend continued acute OT services to maximize patient safety awareness, cognition, balance and activity tolerance in order to facilitate D/C to venue listed below.    08/21/20 1221  OT Visit Information  Last OT Received On 08/21/20  Assistance Needed +1  History of Present Illness Pt adm 3/3 with near syncopal episode and falling in to the bushes when walking her dog. +orthostatics in ED. 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 Pt with confusion.  PMH - AAA, ckd, dm, htn, pvd,paf, avr.  Precautions  Precautions Fall;Other (comment)  Precaution Comments monitor orthostatic BPs  Pain Assessment  Pain Assessment No/denies pain  Cognition  Arousal/Alertness Awake/alert  Behavior During Therapy WFL for tasks assessed/performed  Overall Cognitive Status No family/caregiver present to determine baseline cognitive functioning  Area of Impairment Safety/judgement;Problem solving  Safety/Judgement Decreased awareness of safety;Decreased awareness of deficits  Awareness Emergent  General Comments patient does not appear to initiate calling for nusing help when soiled in bed or wanting to get up. pt states to OT "I've tried to get up three times and I'm wet" upon arrival.  ADL  Overall ADL's  Needs assistance/impaired  Grooming Wash/dry hands;Wash/dry face;Set up;Sitting  Lower Body Dressing Minimal assistance;Sit to/from stand  Lower Body  Dressing Details (indicate cue type and reason) patient able to doff soiled underwear and don clean pair seated on toilet, min A to pull up in back/for balance  Toilet Transfer Minimal assistance;Cueing for safety;Ambulation;Regular Toilet;RW;Grab bars  Toilet Transfer Details (indicate cue type and reason) patient needs assist to utilize grab bar and min A to power up to standing from lower toilet  Toileting- Clothing Manipulation and Hygiene Supervision/safety;Sitting/lateral lean  Functional mobility during ADLs Minimal assistance;Cueing for safety;Rolling walker  Bed Mobility  Overal bed mobility Needs Assistance  Bed Mobility Supine to Sit  Supine to sit Min assist;HOB elevated  General bed mobility comments min A to elevate trunk  Balance  Overall balance assessment Needs assistance  Sitting-balance support Feet supported  Sitting balance-Leahy Scale Fair  Standing balance support Bilateral upper extremity supported  Standing balance-Leahy Scale Poor  Standing balance comment reliant on UE support and external support to maintain standing  Transfers  Overall transfer level Needs assistance  Equipment used Rolling walker (2 wheeled)  Transfers Sit to/from Stand  Sit to Stand Min assist  General transfer comment please see toilet transfer in ADL section, min A for balance in addition to use of walker  OT - End of Session  Equipment Utilized During Treatment Rolling walker  Activity Tolerance Patient tolerated treatment well  Patient left in chair;with call bell/phone within reach;with chair alarm set  Nurse Communication Mobility status  OT Assessment/Plan  OT Plan Discharge plan remains appropriate  OT Visit Diagnosis Unsteadiness on feet (R26.81);Other symptoms and signs involving cognitive function;History of falling (Z91.81)  OT Frequency (ACUTE ONLY) Min 2X/week  Follow Up Recommendations SNF;Supervision/Assistance - 24 hour  OT Equipment 3 in 1 bedside  commode;Other  (comment) (rollator)  AM-PAC OT "6 Clicks" Daily Activity Outcome Measure (Version 2)  Help from another person eating meals? 4  Help from another person taking care of personal grooming? 3  Help from another person toileting, which includes using toliet, bedpan, or urinal? 3  Help from another person bathing (including washing, rinsing, drying)? 2  Help from another person to put on and taking off regular upper body clothing? 3  Help from another person to put on and taking off regular lower body clothing? 3  6 Click Score 18  OT Goal Progression  Progress towards OT goals Progressing toward goals  Acute Rehab OT Goals  Patient Stated Goal get home  OT Goal Formulation With patient  Time For Goal Achievement 08/29/20  Potential to Achieve Goals Good  ADL Goals  Pt Will Perform Grooming with set-up;sitting;standing  Pt Will Perform Lower Body Bathing with set-up;sit to/from stand  Pt Will Perform Lower Body Dressing with set-up;sit to/from stand  Pt Will Transfer to Toilet with supervision;ambulating;regular height toilet  OT Time Calculation  OT Start Time (ACUTE ONLY) 1012  OT Stop Time (ACUTE ONLY) 1032  OT Time Calculation (min) 20 min  OT General Charges  $OT Visit 1 Visit  OT Treatments  $Self Care/Home Management  8-22 mins   Delbert Phenix OT OT pager: [phone]

## 2020-08-21 NOTE — Progress Notes (Signed)
Physical Therapy Treatment Patient Details Name: Sandra Sheppard MRN: [id number] DOB: 08/11/1945 Today's Date: 08/21/2020    History of Present Illness Pt adm 3/3 with near syncopal episode and falling in to the bushes when walking her dog. +orthostatics in ED. 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 Pt with confusion.  PMH - AAA, ckd, dm, htn, pvd,paf, avr.    PT Comments    Pt received in chair, willing to participate in PT. Focused on vestibular examination and interventions to address dizziness/lightheadedness sensation pt reports during mobility. Pt was symptomatic and demonstrated positive tests during smooth pursuits, saccades, and VOR cancellation consistent with potential central vestibular involvement. Responded well (increased exercise tolerance) to VORx1 exercise and saccades but needed repeated cueing for proper technique. Educated that some dizziness is okay with these exercises in order to increase her tolerance, but it should not persist for > 30 min.   During exercises, pt stated "I wish that man in the black mask wasn't staring at me." No man was present in room, but upon further questioning pt reported seeing man in black face covering (mask or box?) standing in doorway. She was pointing to black paper towel dispenser, which we covered with linens, and stating "I've seen doctors walking around wearing those." Left in chair with all needs met, call bell within reach, chair alarm active, and RN aware of status.    Follow Up Recommendations  CIR (pt now realizes she needs time with therapy prior to home)     Equipment Recommendations  Rolling walker with 5" wheels;3in1 (PT)    Recommendations for Other Services Rehab consult     Precautions / Restrictions Precautions Precautions: Fall;Other (comment) Precaution Comments: monitor orthostatic BPs Restrictions Weight Bearing Restrictions: No       Balance Overall balance assessment: Needs  assistance Sitting-balance support: Bilateral upper extremity supported;Feet supported Sitting balance-Leahy Scale: Fair   Postural control: Right lateral lean;Posterior lean Standing balance support: Bilateral upper extremity supported;During functional activity Standing balance-Leahy Scale: Poor Standing balance comment: reliant on UE support and external support to maintain standing                            Cognition Arousal/Alertness: Awake/alert Behavior During Therapy: Flat affect;Anxious Overall Cognitive Status: No family/caregiver present to determine baseline cognitive functioning Area of Impairment: Memory;Following commands;Safety/judgement;Awareness;Problem solving                 Orientation Level:  (NT) Current Attention Level: Sustained Memory: Decreased short-term memory Following Commands: Follows one step commands inconsistently (required repetition of instructions numerous times) Safety/Judgement: Decreased awareness of safety;Decreased awareness of deficits Awareness: Emergent Problem Solving: Requires verbal cues;Requires tactile cues;Slow processing;Difficulty sequencing General Comments: Needed increased cueing and instruction repeated multiple times. Halfway through treatment session, pt stated that it was "difficult to do the exercises with that man in the black mask staring" When further asked, pt stated she saw a man wearing some sort of black face covering standing near bathroom door. Asked if she was referring to the black paper towel dispenser and she stated "maybe. it looks like things I've seen doctors walking around in over their heads". No males present during session or even through window in hallway.      Exercises Other Exercises Other Exercises: VORx1, 2x30 seconds at self selected speed with large letter A Other Exercises: Saccades, 2x20 times at self selected speed with large targets (letter Xs)      General Comments   Smooth  pursuits (horizontal, vertical): increased symptoms, no evidence of saccades Saccades: increased symptoms with horizontal and vertical saccades, unsmooth and jumpy VOR Cancellation: significant increase in symptoms, reported nausea Head impulse test: negative on both sides Cover-uncover test: negative on both sides Cross-cover test: negative on both sides       Pertinent Vitals/Pain Pain Assessment: No/denies pain    Home Living                      Prior Function            PT Goals (current goals can now be found in the care plan section)      Frequency    Min 3X/week      PT Plan Discharge plan needs to be updated    Co-evaluation              AM-PAC PT "6 Clicks" Mobility   Outcome Measure  Help needed turning from your back to your side while in a flat bed without using bedrails?: None Help needed moving from lying on your back to sitting on the side of a flat bed without using bedrails?: A Little Help needed moving to and from a bed to a chair (including a wheelchair)?: A Lot Help needed standing up from a chair using your arms (e.g., wheelchair or bedside chair)?: A Little Help needed to walk in hospital room?: A Lot Help needed climbing 3-5 steps with a railing? : A Lot 6 Click Score: 16    End of Session   Activity Tolerance: Patient tolerated treatment well Patient left: with call bell/phone within reach;in chair;with chair alarm set Nurse Communication: Mobility status PT Visit Diagnosis: Unsteadiness on feet (R26.81);Other abnormalities of gait and mobility (R26.89);Muscle weakness (generalized) (M62.81)     Time:  -     Charges:                        Rosita Kea, SPT

## 2020-08-21 NOTE — Progress Notes (Signed)
PROGRESS NOTE    [name]  [id number] DOB: [birth year] DOA: 08/13/2020 PCP: Sandra Solian, MD    Brief Narrative:   Sandra Sheppard is a 75 year old female with past medical history significant for AAA, paroxysmal atrial fibrillation, carotid artery stenosis, aortic/mitral valve stenosis, CKD stage IV, type 2 diabetes mellitus, hyperlipidemia, essential hypertension, peripheral vascular disease, osteoporosis who presented to the ED following presyncopal episode. Patient reported being lightheaded, off balance and nearly passing out while walking her dog in her neighborhood on 08/13/2020. Patient was assisted to her feet by a neighbor and EMS was activated and subsequently taken to the ED. In the ED, vital signs were stable, on room air.  Patient had mild hyponatremia with elevated creatinine levels.  Chest x-ray with no active cardiopulmonary disease process.  Troponins were negative.  Patient was then admitted to hospital for further evaluation and treatment.  At this time, patient has been awaiting for skilled nursing facility placement but has been declined by [facility].  Patient's sister is  worried about her disposition.  Physical therapy has reevaluated the patient and considered unstable for disposition home.  Appeal to insurance company has been made   Assessment & Plan:   Principal Problem:   Near syncope Active Problems:   Paroxysmal atrial fibrillation (HCC)   Dyslipidemia   Normocytic anemia   Diabetes mellitus type II, non insulin dependent (HCC)   CKD (chronic kidney disease) stage 4, GFR 15-29 ml/min (HCC)   Syncope and collapse   Protein-calorie malnutrition, severe  Near syncope likely secondary to orthostatic hypotension. No loss of consciousness or seizure-like activity. CT head/C-spine without contrast with no acute intracranial abnormality but hematoma of the right forehead noted.  2D echocardiogram was performed which showed preserved LV function with  diastolic dysfunction and bioprosthetic valve.  Antihypertensives have been started at a reduced dose.  Continue physical therapy, occupational therapy.  Acute kidney injury on CKD stage IV: Resolved Creatinine 2.54 on admission.    Latest creatinine of 2.5.  Continue to monitor BMP.  Received some IV fluids.  Paroxysmal atrial fibrillation Not a good candidate for anticoagulation.  Currently rate controlled.  Essential hypertension On  amlodipine '10mg'$  daily at home..  Patient was noted to be orthostatic on admission. Considering amlodipine 5 mg on discharge  Type 2 diabetes mellitus Hemoglobin A1C on 08/16/2020 was 7.1.  Continue sliding scale insulin, Accu-Cheks, diabetic diet.  Hx CVA Continue aspirin and statin  Hyperlipidemia: Continue statin  Anxiety/depression Continue Lexapro  Severe protein calorie malnutrition Present on admission.  Continue nutritional supplements.   Tobacco use disorder Continue nicotine patch.heavy smoker  Cognitive impairment CT scan MRI showed cerebral atrophy noted on imaging study with prior history of CVA and microvascular ischemic changes.    Aortic atherosclerosis Continue aspirin and statin  Debility, deconditioning, poor endurance.  Patient was seen by physical therapy who initially recommended skilled nursing facility placement on discharge.  Physical therapy has reevaluated the patient and patient has issues with balance and high tendency to fall.  Repeat MRI of the brain was done which did not show any acute findings.  Have called the insurance company for expedited appeal.  Patient will benefit from physical therapy at the skilled nursing facility level on discharge.  Spoke at the interdisciplinary meeting today.  DVT prophylaxis: Lovenox subcu    Code Status: Full Code   Family Communication:  None today.  I spoke with patient's sister Ms. Marcie Bal on the phone yesterday.  Disposition Plan:  Status is: Inpatient  The patient is  inpatient due to treatments appropriate due to intensity of illness or inability to take PO, need for rehabilitation placement as per PT  Dispo: The patient is from: Home              Anticipated d/c is to: SNF per PT               Patient currently is medically stable to d/c.   Difficult to place patient No  Consultants:   none  Procedures:   TEE  Antimicrobials:   none  Subjective: Today, patient was seen and examined at bedside.  Mildly sleepy today.  Denies any pain, nausea, vomiting, shortness of breath.   Objective: Vitals:   08/20/20 1647 08/20/20 2145 08/21/20 0531 08/21/20 1059  BP: 136/64 121/75 (!) 129/59 133/68  Pulse: 62 (!) 58 63 (!) 58  Resp: '20 18 18 19  '$ Temp: 98.3 F (36.8 C) 98.2 F (36.8 C) 98.1 F (36.7 C) 98.2 F (36.8 C)  TempSrc:  Oral Oral Oral  SpO2: 92% 94% 95% 96%  Weight:      Height:        Intake/Output Summary (Last 24 hours) at 08/21/2020 1147 Last data filed at 08/21/2020 1105 Gross per 24 hour  Intake 483 ml  Output 600 ml  Net -117 ml   Filed Weights   08/16/20 0501 08/17/20 0550 08/18/20 2042  Weight: 51 kg 50.9 kg 50.8 kg   Body mass index is 18.35 kg/m.  Physical examination: General: Thinly built, not in obvious distress HENT:   No scleral pallor or icterus noted. Oral mucosa is moist.  Chest:  Clear breath sounds.  Diminished breath sounds bilaterally. No crackles or wheezes.  CVS: S1 &S2 heard. No murmur.  Regular rate and rhythm. Abdomen: Soft, nontender, nondistended.  Bowel sounds are heard.   Extremities: No cyanosis, clubbing or edema.  Peripheral pulses are palpable. Psych: Alert, awake and oriented, mildly sleepy today, poor insight CNS: Moves all extremities. Skin: Warm and dry.  No rashes noted.   Data Reviewed: I have personally reviewed following labs and imaging studies  CBC: Recent Labs  Lab 08/16/20 0646  WBC 6.3  HGB 12.6  HCT 36.0  MCV 84.7  PLT 123456   Basic Metabolic Panel: Recent Labs   Lab 08/15/20 0804 08/16/20 0646 08/17/20 0716 08/18/20 0716 08/19/20 0143 08/20/20 0539  NA 135 132* 131* 132* 132*  --   K 4.4 4.6 4.8 5.0 4.4  --   CL 104 102 101 100 101  --   CO2 22 20* 20* 25 20*  --   GLUCOSE 143* 160* 158* 148* 118*  --   BUN 18 25* 31* 34* 37*  --   CREATININE 1.98* 2.09* 2.55* 2.45* 2.25* 2.58*  CALCIUM 9.2 9.1 9.0 9.2 8.9  --    GFR: Estimated Creatinine Clearance: 15.1 mL/min (A) (by C-G formula based on SCr of 2.58 mg/dL (H)). Liver Function Tests: No results for input(s): AST, ALT, ALKPHOS, BILITOT, PROT, ALBUMIN in the last 168 hours. No results for input(s): LIPASE, AMYLASE in the last 168 hours. Recent Labs  Lab 08/14/20 1603  AMMONIA 11   Coagulation Profile: No results for input(s): INR, PROTIME in the last 168 hours. Cardiac Enzymes: No results for input(s): CKTOTAL, CKMB, CKMBINDEX, TROPONINI in the last 168 hours. BNP (last 3 results) No results for input(s): PROBNP in the last 8760 hours. HbA1C: No results for input(s): HGBA1C in the  last 72 hours. CBG: Recent Labs  Lab 08/15/20 0620 08/16/20 0605 08/18/20 0833 08/19/20 0648  GLUCAP 151* 160* 149* 140*   Lipid Profile: No results for input(s): CHOL, HDL, LDLCALC, TRIG, CHOLHDL, LDLDIRECT in the last 72 hours. Thyroid Function Tests: No results for input(s): TSH, T4TOTAL, FREET4, T3FREE, THYROIDAB in the last 72 hours. Anemia Panel: No results for input(s): VITAMINB12, FOLATE, FERRITIN, TIBC, IRON, RETICCTPCT in the last 72 hours. Sepsis Labs: No results for input(s): PROCALCITON, LATICACIDVEN in the last 168 hours.  Recent Results (from the past 240 hour(s))  SARS CORONAVIRUS 2 (TAT 6-24 HRS) Nasopharyngeal Nasopharyngeal Swab     Status: None   Collection Time: 08/14/20  8:49 AM   Specimen: Nasopharyngeal Swab  Result Value Ref Range Status   SARS Coronavirus 2 NEGATIVE NEGATIVE Final    Comment: (NOTE) SARS-CoV-2 target nucleic acids are NOT DETECTED.  The  SARS-CoV-2 RNA is generally detectable in upper and lower respiratory specimens during the acute phase of infection. Negative results do not preclude SARS-CoV-2 infection, do not rule out co-infections with other pathogens, and should not be used as the sole basis for treatment or other patient management decisions. Negative results must be combined with clinical observations, patient history, and epidemiological information. The expected result is Negative.  Fact Sheet for Patients: SugarRoll.be  Fact Sheet for Healthcare Providers: https://www.woods-mathews.com/  This test is not yet approved or cleared by the [place] FDA and  has been authorized for detection and/or diagnosis of SARS-CoV-2 by FDA under an Emergency Use Authorization (EUA). This EUA will remain  in effect (meaning this test can be used) for the duration of the COVID-19 declaration under Se ction 564(b)(1) of the Act, 21 U.S.C. section 360bbb-3(b)(1), unless the authorization is terminated or revoked sooner.  Performed at [facility], [place] [address] [place], [place] [address]   SARS CORONAVIRUS 2 (TAT 6-24 HRS) Nasopharyngeal Nasopharyngeal Swab     Status: None   Collection Time: 08/17/20  1:25 PM   Specimen: Nasopharyngeal Swab  Result Value Ref Range Status   SARS Coronavirus 2 NEGATIVE NEGATIVE Final    Comment: (NOTE) SARS-CoV-2 target nucleic acids are NOT DETECTED.  The SARS-CoV-2 RNA is generally detectable in upper and lower respiratory specimens during the acute phase of infection. Negative results do not preclude SARS-CoV-2 infection, do not rule out co-infections with other pathogens, and should not be used as the sole basis for treatment or other patient management decisions. Negative results must be combined with clinical observations, patient history, and epidemiological information. The expected result is Negative.  Fact Sheet for  Patients: SugarRoll.be  Fact Sheet for Healthcare Providers: https://www.woods-mathews.com/  This test is not yet approved or cleared by the [place] FDA and  has been authorized for detection and/or diagnosis of SARS-CoV-2 by FDA under an Emergency Use Authorization (EUA). This EUA will remain  in effect (meaning this test can be used) for the duration of the COVID-19 declaration under Se ction 564(b)(1) of the Act, 21 U.S.C. section 360bbb-3(b)(1), unless the authorization is terminated or revoked sooner.  Performed at [facility], [place] [address], [place], [place] [address]          Radiology Studies: MR BRAIN WO CONTRAST  Result Date: 08/21/2020 CLINICAL DATA:  Dizziness EXAM: MRI HEAD WITHOUT CONTRAST TECHNIQUE: Multiplanar, multiecho pulse sequences of the brain and surrounding structures were obtained without intravenous contrast. COMPARISON:  08/13/2020 FINDINGS: Brain: No acute infarct, mass effect or extra-axial collection.  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 Confluent hyperintense T2-weighted white matter signal. Generalized volume loss without a clear lobar predilection. The midline structures are normal. Old small vessel infarct of the right basal ganglia and left cerebellum. Vascular: Major flow voids are preserved. Skull and upper cervical spine: Normal calvarium and skull base. Visualized upper cervical spine and soft tissues are normal. Sinuses/Orbits:No paranasal sinus fluid levels or advanced mucosal thickening. No mastoid or middle ear effusion. Normal orbits. IMPRESSION: 1. No acute intracranial abnormality. 2. Confluent hyperintense T2-weighted white matter signal, consistent with chronic microvascular ischemia. 3. Old small vessel infarcts of the right basal ganglia and left cerebellum. Electronically Signed   By: Ulyses Jarred M.D.   On: 08/21/2020 02:18     Scheduled Meds: . amLODipine  5 mg Oral  Daily  . aspirin EC  81 mg Oral Daily  . cholecalciferol  2,000 Units Oral Daily  . docusate sodium  100 mg Oral BID  . enoxaparin (LOVENOX) injection  30 mg Subcutaneous QHS  . escitalopram  5 mg Oral Daily  . feeding supplement  237 mL Oral TID BM  . multivitamin with minerals  1 tablet Oral Daily  . nicotine  21 mg Transdermal Daily  . polyethylene glycol  17 g Oral Daily  . pravastatin  20 mg Oral Daily  . senna-docusate  1 tablet Oral QHS  . sodium chloride flush  3 mL Intravenous Q12H  . cyanocobalamin  1,000 mcg Oral Daily   Continuous Infusions:   LOS: 4 days    Flora Lipps, MD [facility] 08/21/2020, 11:47 AM

## 2020-08-22 DIAGNOSIS — N184 Chronic kidney disease, stage 4 (severe): Secondary | ICD-10-CM | POA: Diagnosis not present

## 2020-08-22 DIAGNOSIS — E119 Type 2 diabetes mellitus without complications: Secondary | ICD-10-CM | POA: Diagnosis not present

## 2020-08-22 DIAGNOSIS — E785 Hyperlipidemia, unspecified: Secondary | ICD-10-CM | POA: Diagnosis not present

## 2020-08-22 DIAGNOSIS — R55 Syncope and collapse: Secondary | ICD-10-CM | POA: Diagnosis not present

## 2020-08-22 LAB — BASIC METABOLIC PANEL
Anion gap: 10 (ref 5–15)
BUN: 46 mg/dL — ABNORMAL HIGH (ref 8–23)
CO2: 21 mmol/L — ABNORMAL LOW (ref 22–32)
Calcium: 8.8 mg/dL — ABNORMAL LOW (ref 8.9–10.3)
Chloride: 104 mmol/L (ref 98–111)
Creatinine, Ser: 2.36 mg/dL — ABNORMAL HIGH (ref 0.44–1.00)
GFR, Estimated: 21 mL/min — ABNORMAL LOW (ref >60)
Glucose, Bld: 138 mg/dL — ABNORMAL HIGH (ref 70–99)
Potassium: 4.4 mmol/L (ref 3.5–5.1)
Sodium: 135 mmol/L (ref 135–145)

## 2020-08-22 LAB — CBC
HCT: 37.3 % (ref 36.0–46.0)
Hemoglobin: 12.1 g/dL (ref 12.0–15.0)
MCH: 28.7 pg (ref 26.0–34.0)
MCHC: 32.4 g/dL (ref 30.0–36.0)
MCV: 88.6 fL (ref 80.0–100.0)
Platelets: 201 10*3/uL (ref 150–400)
RBC: 4.21 MIL/uL (ref 3.87–5.11)
RDW: 13.2 % (ref 11.5–15.5)
WBC: 7.9 10*3/uL (ref 4.0–10.5)
nRBC: 0 % (ref 0.0–0.2)

## 2020-08-22 LAB — GLUCOSE, CAPILLARY: Glucose-Capillary: 138 mg/dL — ABNORMAL HIGH (ref 70–99)

## 2020-08-22 NOTE — Progress Notes (Signed)
PROGRESS NOTE    Sandra Sheppard  [id number] DOB: 11-25-1945 DOA: 08/13/2020 PCP: Prince Solian, MD    Brief Narrative:   Sandra Sheppard is a 75 year old female with past medical history significant for AAA, paroxysmal atrial fibrillation, carotid artery stenosis, aortic/mitral valve stenosis, CKD stage IV, type 2 diabetes mellitus, hyperlipidemia, essential hypertension, peripheral vascular disease, osteoporosis who presented to the ED following presyncopal episode. Patient reported being lightheaded, off balance and nearly passing out while walking her dog in her neighborhood on 08/13/2020. Patient was assisted to her feet by a neighbor and EMS was activated and subsequently taken to the ED. In the ED, vital signs were stable, on room air.  Patient had mild hyponatremia with elevated creatinine levels.  Chest x-ray with no active cardiopulmonary disease process.  Troponins were negative.  Patient was then admitted to hospital for further evaluation and treatment.  At this time, patient has been awaiting for skilled nursing facility placement but has been declined by [facility].  Patient's sister is  worried about her disposition.  Physical therapy has reevaluated the patient and considered unstable for disposition home.  Appeal to insurance company has been made   Assessment & Plan:   Principal Problem:   Near syncope Active Problems:   Paroxysmal atrial fibrillation (HCC)   Dyslipidemia   Normocytic anemia   Diabetes mellitus type II, non insulin dependent (HCC)   CKD (chronic kidney disease) stage 4, GFR 15-29 ml/min (HCC)   Syncope and collapse   Protein-calorie malnutrition, severe  Near syncope likely secondary to orthostatic hypotension. No loss of consciousness or seizure-like activity. CT head/C-spine without contrast with no acute intracranial abnormality but hematoma of the right forehead noted.  2D echocardiogram was performed which showed preserved LV function with  diastolic dysfunction and bioprosthetic valve.  Antihypertensives have been started at a reduced dose.  Continue physical therapy, occupational therapy.  Acute kidney injury on CKD stage IV: Resolved Creatinine 2.54 on admission.    Latest creatinine of 2.3.  Continue to monitor BMP.  Received some IV fluids.  Paroxysmal atrial fibrillation Not a good candidate for anticoagulation.  Currently rate controlled.  Essential hypertension On  amlodipine '10mg'$  daily at home..  Patient was noted to be orthostatic on admission. Considering amlodipine 5 mg on discharge  Type 2 diabetes mellitus Hemoglobin A1C on 08/16/2020 was 7.1.  Continue sliding scale insulin, Accu-Cheks, diabetic diet.  Latest point-of-care glucose of 138  Hx CVA Continue aspirin and statin  Hyperlipidemia: Continue statin  Anxiety/depression Continue Lexapro  Severe protein calorie malnutrition Present on admission.  Continue nutritional supplements.   Tobacco use disorder Continue nicotine patch.heavy smoker  Cognitive impairment CT scan MRI showed cerebral atrophy noted on imaging study with prior history of CVA and microvascular ischemic changes.  Repeat MRI of the brain did not show any acute findings.  Aortic atherosclerosis Continue aspirin and statin  Debility, deconditioning, poor endurance.  At this time patient has been seen by physical therapy and recommend skilled nursing facility patient.  Awaiting for disposition at this time.  Patient has high risk of fall.   DVT prophylaxis: Lovenox subcu    Code Status: Full Code   Family Communication:  None today.  I spoke with patient's sister Ms. Marcie Bal on the phone yesterday.  Disposition Plan:  Status is: Inpatient  The patient is inpatient due to treatments appropriate due to intensity of illness or inability to take PO, need for rehabilitation placement as per PT  Dispo: The  patient is from: Home              Anticipated d/c is to: SNF as per PT                Patient currently is medically stable to d/c.   Difficult to place patient No  Consultants:   none  Procedures:   TEE  Antimicrobials:   none  Subjective: Today, patient was seen and examined at bedside.  Mildly sleepy today.  Denies any pain, nausea, vomiting, shortness of breath.   Objective: Vitals:   08/21/20 1843 08/21/20 2025 08/22/20 0436 08/22/20 1010  BP: 126/73 (!) 161/68 130/60 (!) 145/65  Pulse: 61 (!) 51 (!) 51 (!) 50  Resp: '20 18 17 16  '$ Temp: 97.8 F (36.6 C) 98.2 F (36.8 C) (!) 97.5 F (36.4 C) 97.7 F (36.5 C)  TempSrc: Oral Oral Oral Oral  SpO2: 95% 95% 95% 94%  Weight:      Height:        Intake/Output Summary (Last 24 hours) at 08/22/2020 1134 Last data filed at 08/22/2020 0600 Gross per 24 hour  Intake 480 ml  Output 0 ml  Net 480 ml   Filed Weights   08/16/20 0501 08/17/20 0550 08/18/20 2042  Weight: 51 kg 50.9 kg 50.8 kg   Body mass index is 18.35 kg/m.  Physical examination: General: Thinly built, not in obvious distress HENT:   No scleral pallor or icterus noted. Oral mucosa is moist.  Chest:  Clear breath sounds.  Diminished breath sounds bilaterally. No crackles or wheezes.  CVS: S1 &S2 heard. No murmur.  Regular rate and rhythm. Abdomen: Soft, nontender, nondistended.  Bowel sounds are heard.   Extremities: No cyanosis, clubbing or edema.  Peripheral pulses are palpable. Psych: Alert, awake and oriented, mildly sleepy today, poor insight CNS: Moves all extremities. Skin: Warm and dry.  No rashes noted.   Data Reviewed: I have personally reviewed following labs and imaging studies  CBC: Recent Labs  Lab 08/16/20 0646 08/22/20 0241  WBC 6.3 7.9  HGB 12.6 12.1  HCT 36.0 37.3  MCV 84.7 88.6  PLT 196 123456   Basic Metabolic Panel: Recent Labs  Lab 08/16/20 0646 08/17/20 0716 08/18/20 0716 08/19/20 0143 08/20/20 0539 08/22/20 0241  NA 132* 131* 132* 132*  --  135  K 4.6 4.8 5.0 4.4  --  4.4  CL 102 101  100 101  --  104  CO2 20* 20* 25 20*  --  21*  GLUCOSE 160* 158* 148* 118*  --  138*  BUN 25* 31* 34* 37*  --  46*  CREATININE 2.09* 2.55* 2.45* 2.25* 2.58* 2.36*  CALCIUM 9.1 9.0 9.2 8.9  --  8.8*   GFR: Estimated Creatinine Clearance: 16.5 mL/min (A) (by C-G formula based on SCr of 2.36 mg/dL (H)). Liver Function Tests: No results for input(s): AST, ALT, ALKPHOS, BILITOT, PROT, ALBUMIN in the last 168 hours. No results for input(s): LIPASE, AMYLASE in the last 168 hours. No results for input(s): AMMONIA in the last 168 hours. Coagulation Profile: No results for input(s): INR, PROTIME in the last 168 hours. Cardiac Enzymes: No results for input(s): CKTOTAL, CKMB, CKMBINDEX, TROPONINI in the last 168 hours. BNP (last 3 results) No results for input(s): PROBNP in the last 8760 hours. HbA1C: No results for input(s): HGBA1C in the last 72 hours. CBG: Recent Labs  Lab 08/16/20 0605 08/18/20 0833 08/19/20 0648 08/22/20 0722  GLUCAP 160* 149* 140* 138*  Lipid Profile: No results for input(s): CHOL, HDL, LDLCALC, TRIG, CHOLHDL, LDLDIRECT in the last 72 hours. Thyroid Function Tests: No results for input(s): TSH, T4TOTAL, FREET4, T3FREE, THYROIDAB in the last 72 hours. Anemia Panel: No results for input(s): VITAMINB12, FOLATE, FERRITIN, TIBC, IRON, RETICCTPCT in the last 72 hours. Sepsis Labs: No results for input(s): PROCALCITON, LATICACIDVEN in the last 168 hours.  Recent Results (from the past 240 hour(s))  SARS CORONAVIRUS 2 (TAT 6-24 HRS) Nasopharyngeal Nasopharyngeal Swab     Status: None   Collection Time: 08/14/20  8:49 AM   Specimen: Nasopharyngeal Swab  Result Value Ref Range Status   SARS Coronavirus 2 NEGATIVE NEGATIVE Final    Comment: (NOTE) SARS-CoV-2 target nucleic acids are NOT DETECTED.  The SARS-CoV-2 RNA is generally detectable in upper and lower respiratory specimens during the acute phase of infection. Negative results do not preclude SARS-CoV-2  infection, do not rule out co-infections with other pathogens, and should not be used as the sole basis for treatment or other patient management decisions. Negative results must be combined with clinical observations, patient history, and epidemiological information. The expected result is Negative.  Fact Sheet for Patients: SugarRoll.be  Fact Sheet for Healthcare Providers: https://www.woods-mathews.com/  This test is not yet approved or cleared by the [place] FDA and  has been authorized for detection and/or diagnosis of SARS-CoV-2 by FDA under an Emergency Use Authorization (EUA). This EUA will remain  in effect (meaning this test can be used) for the duration of the COVID-19 declaration under Se ction 564(b)(1) of the Act, 21 U.S.C. section 360bbb-3(b)(1), unless the authorization is terminated or revoked sooner.  Performed at [facility], [place] [address], [place], [place] [address]   SARS CORONAVIRUS 2 (TAT 6-24 HRS) Nasopharyngeal Nasopharyngeal Swab     Status: None   Collection Time: 08/17/20  1:25 PM   Specimen: Nasopharyngeal Swab  Result Value Ref Range Status   SARS Coronavirus 2 NEGATIVE NEGATIVE Final    Comment: (NOTE) SARS-CoV-2 target nucleic acids are NOT DETECTED.  The SARS-CoV-2 RNA is generally detectable in upper and lower respiratory specimens during the acute phase of infection. Negative results do not preclude SARS-CoV-2 infection, do not rule out co-infections with other pathogens, and should not be used as the sole basis for treatment or other patient management decisions. Negative results must be combined with clinical observations, patient history, and epidemiological information. The expected result is Negative.  Fact Sheet for Patients: SugarRoll.be  Fact Sheet for Healthcare Providers: https://www.woods-mathews.com/  This test is not yet approved  or cleared by the [place] FDA and  has been authorized for detection and/or diagnosis of SARS-CoV-2 by FDA under an Emergency Use Authorization (EUA). This EUA will remain  in effect (meaning this test can be used) for the duration of the COVID-19 declaration under Se ction 564(b)(1) of the Act, 21 U.S.C. section 360bbb-3(b)(1), unless the authorization is terminated or revoked sooner.  Performed at [facility], [place] [address], [place], [place] [address]          Radiology Studies: MR BRAIN WO CONTRAST  Result Date: 08/21/2020 CLINICAL DATA:  Dizziness EXAM: MRI HEAD WITHOUT CONTRAST TECHNIQUE: Multiplanar, multiecho pulse sequences of the brain and surrounding structures were obtained without intravenous contrast. COMPARISON:  08/13/2020 FINDINGS: Brain: No acute infarct, mass effect or extra-axial collection. 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 Confluent hyperintense T2-weighted white matter signal. Generalized volume loss without a clear lobar predilection. The midline structures are  normal. Old small vessel infarct of the right basal ganglia and left cerebellum. Vascular: Major flow voids are preserved. Skull and upper cervical spine: Normal calvarium and skull base. Visualized upper cervical spine and soft tissues are normal. Sinuses/Orbits:No paranasal sinus fluid levels or advanced mucosal thickening. No mastoid or middle ear effusion. Normal orbits. IMPRESSION: 1. No acute intracranial abnormality. 2. Confluent hyperintense T2-weighted white matter signal, consistent with chronic microvascular ischemia. 3. Old small vessel infarcts of the right basal ganglia and left cerebellum. Electronically Signed   By: Ulyses Jarred M.D.   On: 08/21/2020 02:18     Scheduled Meds: . amLODipine  5 mg Oral Daily  . aspirin EC  81 mg Oral Daily  . cholecalciferol  2,000 Units Oral Daily  . docusate sodium  100 mg Oral BID  . enoxaparin (LOVENOX) injection  30 mg  Subcutaneous QHS  . escitalopram  5 mg Oral Daily  . feeding supplement  237 mL Oral TID BM  . multivitamin with minerals  1 tablet Oral Daily  . nicotine  21 mg Transdermal Daily  . polyethylene glycol  17 g Oral Daily  . pravastatin  20 mg Oral Daily  . senna-docusate  1 tablet Oral QHS  . sodium chloride flush  3 mL Intravenous Q12H  . cyanocobalamin  1,000 mcg Oral Daily   Continuous Infusions:   LOS: 5 days    Flora Lipps, MD [facility] 08/22/2020, 11:34 AM

## 2020-08-23 DIAGNOSIS — E785 Hyperlipidemia, unspecified: Secondary | ICD-10-CM | POA: Diagnosis not present

## 2020-08-23 DIAGNOSIS — N184 Chronic kidney disease, stage 4 (severe): Secondary | ICD-10-CM | POA: Diagnosis not present

## 2020-08-23 DIAGNOSIS — E119 Type 2 diabetes mellitus without complications: Secondary | ICD-10-CM | POA: Diagnosis not present

## 2020-08-23 DIAGNOSIS — R55 Syncope and collapse: Secondary | ICD-10-CM | POA: Diagnosis not present

## 2020-08-23 NOTE — Progress Notes (Signed)
PROGRESS NOTE    DIMOND RASCON  [id number] DOB: 02-03-1946 DOA: 08/13/2020 PCP: Prince Solian, MD    Brief Narrative:   Sandra Sheppard is a 75 year old female with past medical history significant for AAA, paroxysmal atrial fibrillation, carotid artery stenosis, aortic/mitral valve stenosis, CKD stage IV, type 2 diabetes mellitus, hyperlipidemia, essential hypertension, peripheral vascular disease, osteoporosis who presented to the ED following presyncopal episode. Patient reported being lightheaded, off balance and nearly passing out while walking her dog in her neighborhood on 08/13/2020. Patient was assisted to her feet by a neighbor and EMS was activated and subsequently taken to the ED. In the ED, vital signs were stable, on room air.  Patient had mild hyponatremia with elevated creatinine levels.  Chest x-ray with no active cardiopulmonary disease process.  Troponins were negative.  Patient was then admitted to hospital for further evaluation and treatment.  At this time, patient has been awaiting for skilled nursing facility placement but has been declined by [facility].  Patient's sister is  worried about her disposition.  Physical therapy has reevaluated the patient and considered unstable for disposition home.  Appeal to insurance company has been made   Assessment & Plan:   Principal Problem:   Near syncope Active Problems:   Paroxysmal atrial fibrillation (HCC)   Dyslipidemia   Normocytic anemia   Diabetes mellitus type II, non insulin dependent (HCC)   CKD (chronic kidney disease) stage 4, GFR 15-29 ml/min (HCC)   Syncope and collapse   Protein-calorie malnutrition, severe  Near syncope likely secondary to orthostatic hypotension. No loss of consciousness or seizure-like activity. CT head/C-spine without contrast with no acute intracranial abnormality but hematoma of the right forehead noted.  2D echocardiogram was performed which showed preserved LV function with  diastolic dysfunction and bioprosthetic valve.  Antihypertensives have been started at a reduced dose.  Continue physical therapy, occupational therapy.  Acute kidney injury on CKD stage IV: Resolved Creatinine 2.54 on admission.    Latest creatinine of 2.3.  Continue to monitor BMP.  Received some IV fluids.  Paroxysmal atrial fibrillation Not a good candidate for anticoagulation.  Currently rate controlled.  Latest  heart rate of 63  Essential hypertensio On  amlodipine '10mg'$  daily at home..  Patient was noted to be orthostatic on admission. Considering amlodipine 5 mg on discharge  Type 2 diabetes mellitus Hemoglobin A1C on 08/16/2020 was 7.1.  Continue sliding scale insulin, Accu-Cheks, diabetic diet.  Latest point-of-care glucose of 138  Hx CVA Continue aspirin and statin  Hyperlipidemia: Continue statin  Anxiety/depression Continue Lexapro  Severe protein calorie malnutrition Present on admission.  Continue nutritional supplements.   Tobacco use disorder Continue nicotine patch. Patient is a heavy smoker  Cognitive impairment CT scan MRI showed cerebral atrophy noted on imaging study with prior history of CVA and microvascular ischemic changes.  Repeat MRI of the brain did not show any acute findings.  Aortic atherosclerosis Continue aspirin and statin  Debility, deconditioning, poor endurance.  At this time patient has been seen by physical therapy and recommend skilled nursing facility patient.  Awaiting for disposition at this time.  Patient has high risk of fall.   DVT prophylaxis: Lovenox subcu    Code Status: Full Code   Family Communication:  None today.   Disposition Plan:  Status is: Inpatient  The patient is inpatient due to treatments appropriate due to intensity of illness or inability to take PO, need for rehabilitation placement as per PT  Dispo: The  patient is from: Home              Anticipated d/c is to: SNF as per PT pending disposition.               Patient currently is medically stable to d/c.   Difficult to place patient No  Consultants:   none  Procedures:   TEE  Antimicrobials:   none  Subjective: Today, patient was seen and examined at bedside.  Wants to take this out.  Denies any nausea vomiting fever or chills.  States that she could not sleep well yesterday.  Objective: Vitals:   08/23/20 0426 08/23/20 0426 08/23/20 0448 08/23/20 1121  BP: (!) 183/74 (!) 183/74 (!) 180/77 131/74  Pulse: 67 67 91 63  Resp: '18 16 18 16  '$ Temp: (!) 97.5 F (36.4 C) (!) 97.5 F (36.4 C) 99.3 F (37.4 C) 97.7 F (36.5 C)  TempSrc: Oral Oral Oral Oral  SpO2: 98% 98% 95% 97%  Weight:      Height:        Intake/Output Summary (Last 24 hours) at 08/23/2020 1201 Last data filed at 08/23/2020 0900 Gross per 24 hour  Intake 843 ml  Output 225 ml  Net 618 ml   Filed Weights   08/16/20 0501 08/17/20 0550 08/18/20 2042  Weight: 51 kg 50.9 kg 50.8 kg   Body mass index is 18.35 kg/m.  Physical examination: General: Thinly built, not in obvious distress HENT:   No scleral pallor or icterus noted. Oral mucosa is moist.  Chest:  Clear breath sounds.  Diminished breath sounds bilaterally. No crackles or wheezes.  CVS: S1 &S2 heard. No murmur.  Regular rate and rhythm. Abdomen: Soft, nontender, nondistended.  Bowel sounds are heard.   Extremities: No cyanosis, clubbing or edema.  Peripheral pulses are palpable. Psych: Alert, awake and oriented, normal mood, poor insight CNS:  No cranial nerve deficits.  Moves all extremities Skin: Warm and dry.  No rashes noted.  Data Reviewed: I have personally reviewed following labs and imaging studies  CBC: Recent Labs  Lab 08/22/20 0241  WBC 7.9  HGB 12.1  HCT 37.3  MCV 88.6  PLT 123456   Basic Metabolic Panel: Recent Labs  Lab 08/17/20 0716 08/18/20 0716 08/19/20 0143 08/20/20 0539 08/22/20 0241  NA 131* 132* 132*  --  135  K 4.8 5.0 4.4  --  4.4  CL 101 100 101  --  104   CO2 20* 25 20*  --  21*  GLUCOSE 158* 148* 118*  --  138*  BUN 31* 34* 37*  --  46*  CREATININE 2.55* 2.45* 2.25* 2.58* 2.36*  CALCIUM 9.0 9.2 8.9  --  8.8*   GFR: Estimated Creatinine Clearance: 16.5 mL/min (A) (by C-G formula based on SCr of 2.36 mg/dL (H)). Liver Function Tests: No results for input(s): AST, ALT, ALKPHOS, BILITOT, PROT, ALBUMIN in the last 168 hours. No results for input(s): LIPASE, AMYLASE in the last 168 hours. No results for input(s): AMMONIA in the last 168 hours. Coagulation Profile: No results for input(s): INR, PROTIME in the last 168 hours. Cardiac Enzymes: No results for input(s): CKTOTAL, CKMB, CKMBINDEX, TROPONINI in the last 168 hours. BNP (last 3 results) No results for input(s): PROBNP in the last 8760 hours. HbA1C: No results for input(s): HGBA1C in the last 72 hours. CBG: Recent Labs  Lab 08/18/20 0833 08/19/20 0648 08/22/20 0722  GLUCAP 149* 140* 138*   Lipid Profile: No results for  input(s): CHOL, HDL, LDLCALC, TRIG, CHOLHDL, LDLDIRECT in the last 72 hours. Thyroid Function Tests: No results for input(s): TSH, T4TOTAL, FREET4, T3FREE, THYROIDAB in the last 72 hours. Anemia Panel: No results for input(s): VITAMINB12, FOLATE, FERRITIN, TIBC, IRON, RETICCTPCT in the last 72 hours. Sepsis Labs: No results for input(s): PROCALCITON, LATICACIDVEN in the last 168 hours.  Recent Results (from the past 240 hour(s))  SARS CORONAVIRUS 2 (TAT 6-24 HRS) Nasopharyngeal Nasopharyngeal Swab     Status: None   Collection Time: 08/14/20  8:49 AM   Specimen: Nasopharyngeal Swab  Result Value Ref Range Status   SARS Coronavirus 2 NEGATIVE NEGATIVE Final    Comment: (NOTE) SARS-CoV-2 target nucleic acids are NOT DETECTED.  The SARS-CoV-2 RNA is generally detectable in upper and lower respiratory specimens during the acute phase of infection. Negative results do not preclude SARS-CoV-2 infection, do not rule out co-infections with other pathogens,  and should not be used as the sole basis for treatment or other patient management decisions. Negative results must be combined with clinical observations, patient history, and epidemiological information. The expected result is Negative.  Fact Sheet for Patients: SugarRoll.be  Fact Sheet for Healthcare Providers: https://www.woods-mathews.com/  This test is not yet approved or cleared by the [place] FDA and  has been authorized for detection and/or diagnosis of SARS-CoV-2 by FDA under an Emergency Use Authorization (EUA). This EUA will remain  in effect (meaning this test can be used) for the duration of the COVID-19 declaration under Se ction 564(b)(1) of the Act, 21 U.S.C. section 360bbb-3(b)(1), unless the authorization is terminated or revoked sooner.  Performed at [facility], [place] [address] [place], [place] [address]   SARS CORONAVIRUS 2 (TAT 6-24 HRS) Nasopharyngeal Nasopharyngeal Swab     Status: None   Collection Time: 08/17/20  1:25 PM   Specimen: Nasopharyngeal Swab  Result Value Ref Range Status   SARS Coronavirus 2 NEGATIVE NEGATIVE Final    Comment: (NOTE) SARS-CoV-2 target nucleic acids are NOT DETECTED.  The SARS-CoV-2 RNA is generally detectable in upper and lower respiratory specimens during the acute phase of infection. Negative results do not preclude SARS-CoV-2 infection, do not rule out co-infections with other pathogens, and should not be used as the sole basis for treatment or other patient management decisions. Negative results must be combined with clinical observations, patient history, and epidemiological information. The expected result is Negative.  Fact Sheet for Patients: SugarRoll.be  Fact Sheet for Healthcare Providers: https://www.woods-mathews.com/  This test is not yet approved or cleared by the [place] FDA and  has been authorized  for detection and/or diagnosis of SARS-CoV-2 by FDA under an Emergency Use Authorization (EUA). This EUA will remain  in effect (meaning this test can be used) for the duration of the COVID-19 declaration under Se ction 564(b)(1) of the Act, 21 U.S.C. section 360bbb-3(b)(1), unless the authorization is terminated or revoked sooner.  Performed at [facility], [place] [address] [place], [place] [address]          Radiology Studies: No results found.   Scheduled Meds: . amLODipine  5 mg Oral Daily  . aspirin EC  81 mg Oral Daily  . cholecalciferol  2,000 Units Oral Daily  . docusate sodium  100 mg Oral BID  . enoxaparin (LOVENOX) injection  30 mg Subcutaneous QHS  . escitalopram  5 mg Oral Daily  . feeding supplement  237 mL Oral TID BM  . multivitamin with minerals  1  tablet Oral Daily  . nicotine  21 mg Transdermal Daily  . polyethylene glycol  17 g Oral Daily  . pravastatin  20 mg Oral Daily  . senna-docusate  1 tablet Oral QHS  . sodium chloride flush  3 mL Intravenous Q12H  . cyanocobalamin  1,000 mcg Oral Daily   Continuous Infusions:   LOS: 6 days    Flora Lipps, MD [facility] 08/23/2020, 12:01 PM

## 2020-08-23 NOTE — Progress Notes (Signed)
As night progresses, the patient is increasingly confused and impulsive.  She is speaking to someone in the room that is not there.  She is holding complete conversations with them.  She insists that she was in jail 2 days ago.  She keeps getting out of bed to attempt to go home.  She has been reoriented multiple times with minimal success.  She forgets she has Purewick on and gets out of the bed without calling for help to go to the bathroom.  When assisted out of bed, her gait is unsteady and she is leaning to the right side.  Will continue to monitor patient.  Earleen Reaper RN

## 2020-08-23 NOTE — Progress Notes (Signed)
Pt is confused and keeps trying to get OOB and out of her chair. Pt is a safety precaution for falls. RN messaged MD on call for orders for a posey waist belt and four side rails. Awaiting response from the MD on call.   Eleanora Neighbor, RN

## 2020-08-23 NOTE — Progress Notes (Signed)
HOSPITAL MEDICINE OVERNIGHT EVENT NOTE    Notified by nursing that patient has continued to exhibit substantial confusion and frequent attempts to get out of bed, placing herself at risk of harm.  Unfortunately, there is no staff available and therefore a sitter will not be possible.  Patient is not redirectable according to nursing.  After discussing various options with nursing we have decided to place a soft waist belt and raising the 4 bed rails and attempt to protect the patient.  Nursing to continue to monitor the patient closely and notify me if this is not successful.  Vernelle Emerald  MD [facility]

## 2020-08-23 NOTE — Plan of Care (Signed)
  Problem: Health Behavior/Discharge Planning: Goal: Ability to manage health-related needs will improve Outcome: Progressing   Problem: Activity: Goal: Risk for activity intolerance will decrease Outcome: Progressing   Problem: Coping: Goal: Level of anxiety will decrease Outcome: Progressing   Problem: Skin Integrity: Goal: Risk for impaired skin integrity will decrease Outcome: Progressing

## 2020-08-23 NOTE — Progress Notes (Signed)
RN made MD aware of pt refusing Lovenox. RN tried to explain importance but pt is still refusing. Pt is confused and is hallucinating talking to someone who is not there. Pt is resting, RN will continue to monitor pt. RN also spoke with family and made aware of posey belt.   Eleanora Neighbor, RN

## 2020-08-24 DIAGNOSIS — E785 Hyperlipidemia, unspecified: Secondary | ICD-10-CM | POA: Diagnosis not present

## 2020-08-24 DIAGNOSIS — E119 Type 2 diabetes mellitus without complications: Secondary | ICD-10-CM | POA: Diagnosis not present

## 2020-08-24 DIAGNOSIS — R55 Syncope and collapse: Secondary | ICD-10-CM | POA: Diagnosis not present

## 2020-08-24 DIAGNOSIS — N184 Chronic kidney disease, stage 4 (severe): Secondary | ICD-10-CM | POA: Diagnosis not present

## 2020-08-24 LAB — CBC
HCT: 38.9 % (ref 36.0–46.0)
Hemoglobin: 13.3 g/dL (ref 12.0–15.0)
MCH: 29.7 pg (ref 26.0–34.0)
MCHC: 34.2 g/dL (ref 30.0–36.0)
MCV: 86.8 fL (ref 80.0–100.0)
Platelets: 221 10*3/uL (ref 150–400)
RBC: 4.48 MIL/uL (ref 3.87–5.11)
RDW: 13.2 % (ref 11.5–15.5)
WBC: 7.2 10*3/uL (ref 4.0–10.5)
nRBC: 0 % (ref 0.0–0.2)

## 2020-08-24 LAB — MAGNESIUM: Magnesium: 2.7 mg/dL — ABNORMAL HIGH (ref 1.7–2.4)

## 2020-08-24 LAB — COMPREHENSIVE METABOLIC PANEL
ALT: 13 U/L (ref 0–44)
AST: 15 U/L (ref 15–41)
Albumin: 3.8 g/dL (ref 3.5–5.0)
Alkaline Phosphatase: 82 U/L (ref 38–126)
Anion gap: 9 (ref 5–15)
BUN: 36 mg/dL — ABNORMAL HIGH (ref 8–23)
CO2: 23 mmol/L (ref 22–32)
Calcium: 9.3 mg/dL (ref 8.9–10.3)
Chloride: 103 mmol/L (ref 98–111)
Creatinine, Ser: 2.27 mg/dL — ABNORMAL HIGH (ref 0.44–1.00)
GFR, Estimated: 22 mL/min — ABNORMAL LOW (ref >60)
Glucose, Bld: 154 mg/dL — ABNORMAL HIGH (ref 70–99)
Potassium: 4.9 mmol/L (ref 3.5–5.1)
Sodium: 135 mmol/L (ref 135–145)
Total Bilirubin: 0.5 mg/dL (ref 0.3–1.2)
Total Protein: 6.9 g/dL (ref 6.5–8.1)

## 2020-08-24 LAB — PHOSPHORUS: Phosphorus: 3.2 mg/dL (ref 2.5–4.6)

## 2020-08-24 MED ORDER — QUETIAPINE FUMARATE 25 MG PO TABS
25.0000 mg | ORAL_TABLET | Freq: Every day | ORAL | Status: DC
  Administered 2020-08-24 – 2020-08-31 (×8): 25 mg via ORAL
  Filled 2020-08-24 (×10): qty 1

## 2020-08-24 MED ORDER — HALOPERIDOL LACTATE 5 MG/ML IJ SOLN: 1.0000 mg | Freq: Four times a day (QID) | INTRAMUSCULAR | Status: DC | PRN

## 2020-08-24 NOTE — Progress Notes (Signed)
PT Cancellation Note  Patient Details Name: Sandra Sheppard MRN: [id number] DOB: [birth year]   Cancelled Treatment:    Reason Eval/Treat Not Completed: Other (comment)   Discussed pt status with RN, who informed us of pt's increasing restlessness, and the need for ativan;  Opted to hold PT and will reattempt;   Roney Marion, [place] Pager [phone] Office [phone]    Colletta [place] 08/24/2020, 6:19 PM

## 2020-08-24 NOTE — Progress Notes (Addendum)
Patient was increasingly agitated, impulsive, and attempting to get out of the bed, RN administered ativan per family request and placed back the posey belt restraint per family request. RN wil continue to monitor this patient. Patient's sister Meredith Pel also requested to speak with MD regarding her sister's current health status and plan of care, MD has been notified.

## 2020-08-24 NOTE — TOC Progression Note (Signed)
Transition of Care [facility]) - Progression Note    Patient Details  Name: Sandra Sheppard MRN: [id number] Date of Birth: [birth year]  Transition of Care [facility] CM/SW Contact  Sharlet Salina Mila Homer, LCSW Phone Number: 08/24/2020, 3:07 PM  Clinical Narrative:  CSW talked with sister and provided update regarding MD doing a peer-to-peer and waiting to hear from [facility]. Ms. [name] was advised that she will be informed once [name] makes their decision. Sister expressed understanding and thanked CSW for update.       Expected Discharge Plan: [place] Barriers to Discharge: Continued Medical Work up  Expected Discharge Plan and Services Expected Discharge Plan: [place] In-house Referral: Clinical Social Work     Living arrangements for the past 2 months: Single Family Home                                     Social Determinants of Health (SDOH) Interventions    Readmission Risk Interventions No flowsheet data found.

## 2020-08-24 NOTE — Progress Notes (Addendum)
PROGRESS NOTE    Sandra Sheppard  [id number] DOB: March 10, 1946 DOA: 08/13/2020 PCP: Prince Solian, MD    Brief Narrative:   Sandra Sheppard is a 75 year old female with past medical history significant for AAA, paroxysmal atrial fibrillation, carotid artery stenosis, aortic/mitral valve stenosis, CKD stage IV, type 2 diabetes mellitus, hyperlipidemia, essential hypertension, peripheral vascular disease, osteoporosis who presented to the ED following presyncopal episode. Patient reported being lightheaded, off balance and nearly passing out while walking her dog in her neighborhood on 08/13/2020. Patient was assisted to her feet by a neighbor and EMS was activated and subsequently taken to the ED. In the ED, vital signs were stable, on room air.  Patient had mild hyponatremia with elevated creatinine levels.  Chest x-ray with no active cardiopulmonary disease process.  Troponins were negative.  Patient was then admitted to hospital for further evaluation and treatment.  At this time, patient has been awaiting for skilled nursing facility placement but has been declined by [facility].  Patient's sister is  worried about her disposition.  Physical therapy has reevaluated the patient and considered unstable for disposition home.     Assessment & Plan:   Principal Problem:   Near syncope Active Problems:   Paroxysmal atrial fibrillation (HCC)   Dyslipidemia   Normocytic anemia   Diabetes mellitus type II, non insulin dependent (HCC)   CKD (chronic kidney disease) stage 4, GFR 15-29 ml/min (HCC)   Syncope and collapse   Protein-calorie malnutrition, severe  Near syncope likely secondary to orthostatic hypotension. No loss of consciousness or seizure-like activity. CT head/C-spine without contrast with no acute intracranial abnormality but hematoma of the right forehead noted.  2D echocardiogram was performed which showed preserved LV function with diastolic dysfunction and bioprosthetic  valve.  Antihypertensives have been started at a reduced dose.  Continue physical therapy, occupational therapy.  Confusion and agitation likely hospital induced delirium.  Likely has underlying baseline cognitive dysfunction with dementia.  Will need supportive care.  Reorientation.  Spoke with the patient's sister about it.  Add as needed Haldol.  EKG was normal.  Check MRI of the brain CT head was negative for acute findings except for chronic microvascular ischemic changes.  Add low-dose Seroquel tonight.  Try to avoid benzodiazepines.  Acute kidney injury on CKD stage IV: Resolved Creatinine 2.54 on admission.    Latest creatinine of 2.2.  Continue to monitor BMP.    Paroxysmal atrial fibrillation Not a good candidate for anticoagulation.  Currently rate controlled.  Latest  heart rate of 68  Essential hypertension On  amlodipine '10mg'$  daily at home..  Patient was noted to be orthostatic on admission. Considering amlodipine 5 mg on discharge  Type 2 diabetes mellitus Hemoglobin A1C on 08/16/2020 was 7.1.  Continue sliding scale insulin, Accu-Cheks, diabetic diet.    Hx CVA Continue aspirin and statin  Hyperlipidemia: Continue statin  Anxiety/depression Continue Lexapro  Severe protein calorie malnutrition Present on admission.  Continue nutritional supplements.   Tobacco use disorder Continue nicotine patch. Patient is a heavy smoker  Cognitive impairment CT scan MRI showed cerebral atrophy noted on imaging study with prior history of CVA and microvascular ischemic changes.  Repeat MRI of the brain did not show any acute findings.  Aortic atherosclerosis Continue aspirin and statin  Debility, deconditioning, poor endurance.  At this time, patient has been seen by physical therapy and recommend skilled nursing facility patient.  Awaiting for disposition at this time.  Patient has high risk of fall.  DVT prophylaxis: Lovenox subcu    Code Status: Full Code   Family  Communication:  I spoke with the patient's sister Ms. Anne Ng on the phone about the clinical condition of the patient.  Disposition Plan:  Status is: Inpatient  The patient is inpatient due to treatments appropriate due to intensity of illness or inability to take PO, need for rehabilitation placement as per PT  Dispo: The patient is from: Home              Anticipated d/c is to: SNF as per PT, pending disposition.              Patient currently is medically stable to d/c.   Difficult to place patient No  Consultants:   none  Procedures:   TEE  Antimicrobials:   none  Subjective: Today, patient was seen and examined at bedside. Denies nausea vomiting fever.  Nursing staff reported that the patient was very confused and agitated at times.  Objective: Vitals:   08/23/20 1121 08/23/20 1700 08/23/20 2054 08/24/20 0938  BP: 131/74 129/74 (!) 160/55 (!) 145/72  Pulse: 63 95 66 68  Resp: '16 16 17 14  '$ Temp: 97.7 F (36.5 C) 97.9 F (36.6 C) 98.5 F (36.9 C) 98 F (36.7 C)  TempSrc: Oral Oral Oral   SpO2: 97% 98% 97% 97%  Weight:      Height:        Intake/Output Summary (Last 24 hours) at 08/24/2020 1435 Last data filed at 08/24/2020 1032 Gross per 24 hour  Intake 703 ml  Output 200 ml  Net 503 ml   Filed Weights   08/16/20 0501 08/17/20 0550 08/18/20 2042  Weight: 51 kg 50.9 kg 50.8 kg   Body mass index is 18.35 kg/m.  Physical examination: General:  Thinly built, not in obvious distress HENT:   No scleral pallor or icterus noted. Oral mucosa is moist.  Chest:  Clear breath sounds.  Diminished breath sounds bilaterally. No crackles or wheezes.  CVS: S1 &S2 heard. No murmur.  Regular rate and rhythm. Abdomen: Soft, nontender, nondistended.  Bowel sounds are heard.   Extremities: No cyanosis, clubbing or edema.  Peripheral pulses are palpable. Psych: Alert, awake and oriented, normal mood CNS:  No cranial nerve deficits.  Power equal in all extremities.    Skin: Warm and dry.  No rashes noted.   Data Reviewed: I have personally reviewed following labs and imaging studies  CBC: Recent Labs  Lab 08/22/20 0241 08/24/20 0332  WBC 7.9 7.2  HGB 12.1 13.3  HCT 37.3 38.9  MCV 88.6 86.8  PLT 201 [id number]   Basic Metabolic Panel: Recent Labs  Lab 08/18/20 0716 08/19/20 0143 08/20/20 0539 08/22/20 0241 08/24/20 0332  NA 132* 132*  --  135 135  K 5.0 4.4  --  4.4 4.9  CL 100 101  --  104 103  CO2 25 20*  --  21* 23  GLUCOSE 148* 118*  --  138* 154*  BUN 34* 37*  --  46* 36*  CREATININE 2.45* 2.25* 2.58* 2.36* 2.27*  CALCIUM 9.2 8.9  --  8.8* 9.3  MG  --   --   --   --  2.7*  PHOS  --   --   --   --  3.2   GFR: Estimated Creatinine Clearance: 17.2 mL/min (A) (by C-G formula based on SCr of 2.27 mg/dL (H)). Liver Function Tests: Recent Labs  Lab 08/24/20 0332  AST 15  ALT 13  ALKPHOS 82  BILITOT 0.5  PROT 6.9  ALBUMIN 3.8   No results for input(s): LIPASE, AMYLASE in the last 168 hours. No results for input(s): AMMONIA in the last 168 hours. Coagulation Profile: No results for input(s): INR, PROTIME in the last 168 hours. Cardiac Enzymes: No results for input(s): CKTOTAL, CKMB, CKMBINDEX, TROPONINI in the last 168 hours. BNP (last 3 results) No results for input(s): PROBNP in the last 8760 hours. HbA1C: No results for input(s): HGBA1C in the last 72 hours. CBG: Recent Labs  Lab 08/18/20 0833 08/19/20 0648 08/22/20 0722  GLUCAP 149* 140* 138*   Lipid Profile: No results for input(s): CHOL, HDL, LDLCALC, TRIG, CHOLHDL, LDLDIRECT in the last 72 hours. Thyroid Function Tests: No results for input(s): TSH, T4TOTAL, FREET4, T3FREE, THYROIDAB in the last 72 hours. Anemia Panel: No results for input(s): VITAMINB12, FOLATE, FERRITIN, TIBC, IRON, RETICCTPCT in the last 72 hours. Sepsis Labs: No results for input(s): PROCALCITON, LATICACIDVEN in the last 168 hours.  Recent Results (from the past 240 hour(s))  SARS  CORONAVIRUS 2 (TAT 6-24 HRS) Nasopharyngeal Nasopharyngeal Swab     Status: None   Collection Time: 08/17/20  1:25 PM   Specimen: Nasopharyngeal Swab  Result Value Ref Range Status   SARS Coronavirus 2 NEGATIVE NEGATIVE Final    Comment: (NOTE) SARS-CoV-2 target nucleic acids are NOT DETECTED.  The SARS-CoV-2 RNA is generally detectable in upper and lower respiratory specimens during the acute phase of infection. Negative results do not preclude SARS-CoV-2 infection, do not rule out co-infections with other pathogens, and should not be used as the sole basis for treatment or other patient management decisions. Negative results must be combined with clinical observations, patient history, and epidemiological information. The expected result is Negative.  Fact Sheet for Patients: SugarRoll.be  Fact Sheet for Healthcare Providers: https://www.woods-mathews.com/  This test is not yet approved or cleared by the [place] FDA and  has been authorized for detection and/or diagnosis of SARS-CoV-2 by FDA under an Emergency Use Authorization (EUA). This EUA will remain  in effect (meaning this test can be used) for the duration of the COVID-19 declaration under Se ction 564(b)(1) of the Act, 21 U.S.C. section 360bbb-3(b)(1), unless the authorization is terminated or revoked sooner.  Performed at [facility], [place] [address], [place], [place] [address]          Radiology Studies: No results found.   Scheduled Meds: . amLODipine  5 mg Oral Daily  . aspirin EC  81 mg Oral Daily  . cholecalciferol  2,000 Units Oral Daily  . docusate sodium  100 mg Oral BID  . enoxaparin (LOVENOX) injection  30 mg Subcutaneous QHS  . escitalopram  5 mg Oral Daily  . feeding supplement  237 mL Oral TID BM  . multivitamin with minerals  1 tablet Oral Daily  . nicotine  21 mg Transdermal Daily  . polyethylene glycol  17 g Oral Daily  . pravastatin   20 mg Oral Daily  . senna-docusate  1 tablet Oral QHS  . sodium chloride flush  3 mL Intravenous Q12H  . cyanocobalamin  1,000 mcg Oral Daily   Continuous Infusions:   LOS: 7 days    Flora Lipps, MD [facility] 08/24/2020, 2:35 PM

## 2020-08-25 DIAGNOSIS — R55 Syncope and collapse: Secondary | ICD-10-CM | POA: Diagnosis not present

## 2020-08-25 DIAGNOSIS — N184 Chronic kidney disease, stage 4 (severe): Secondary | ICD-10-CM | POA: Diagnosis not present

## 2020-08-25 DIAGNOSIS — E785 Hyperlipidemia, unspecified: Secondary | ICD-10-CM | POA: Diagnosis not present

## 2020-08-25 DIAGNOSIS — E119 Type 2 diabetes mellitus without complications: Secondary | ICD-10-CM | POA: Diagnosis not present

## 2020-08-25 LAB — CBC
HCT: 41.7 % (ref 36.0–46.0)
Hemoglobin: 13.7 g/dL (ref 12.0–15.0)
MCH: 29.2 pg (ref 26.0–34.0)
MCHC: 32.9 g/dL (ref 30.0–36.0)
MCV: 88.9 fL (ref 80.0–100.0)
Platelets: 231 10*3/uL (ref 150–400)
RBC: 4.69 MIL/uL (ref 3.87–5.11)
RDW: 13.3 % (ref 11.5–15.5)
WBC: 7.1 10*3/uL (ref 4.0–10.5)
nRBC: 0 % (ref 0.0–0.2)

## 2020-08-25 LAB — MAGNESIUM: Magnesium: 2.9 mg/dL — ABNORMAL HIGH (ref 1.7–2.4)

## 2020-08-25 LAB — PHOSPHORUS: Phosphorus: 3.8 mg/dL (ref 2.5–4.6)

## 2020-08-25 LAB — BASIC METABOLIC PANEL
Anion gap: 8 (ref 5–15)
BUN: 37 mg/dL — ABNORMAL HIGH (ref 8–23)
CO2: 25 mmol/L (ref 22–32)
Calcium: 9.5 mg/dL (ref 8.9–10.3)
Chloride: 103 mmol/L (ref 98–111)
Creatinine, Ser: 2.28 mg/dL — ABNORMAL HIGH (ref 0.44–1.00)
GFR, Estimated: 22 mL/min — ABNORMAL LOW (ref >60)
Glucose, Bld: 137 mg/dL — ABNORMAL HIGH (ref 70–99)
Potassium: 4.4 mmol/L (ref 3.5–5.1)
Sodium: 136 mmol/L (ref 135–145)

## 2020-08-25 MED ORDER — DEXTROSE 50 % IV SOLN
INTRAVENOUS | Status: AC
  Filled 2020-08-25: qty 50

## 2020-08-25 NOTE — Progress Notes (Signed)
PROGRESS NOTE    Sandra Sheppard  [id number] DOB: 15-Dec-1945 DOA: 08/13/2020 PCP: Prince Solian, MD    Brief Narrative:   DEYLA TEBBE is a 75 year old female with past medical history significant for AAA, paroxysmal atrial fibrillation, carotid artery stenosis, aortic/mitral valve stenosis, CKD stage IV, type 2 diabetes mellitus, hyperlipidemia, essential hypertension, peripheral vascular disease, osteoporosis who presented to the ED following presyncopal episode. Patient reported being lightheaded, off balance and nearly passing out while walking her dog in her neighborhood on 08/13/2020. Patient was assisted to her feet by a neighbor and EMS was activated and subsequently taken to the ED. In the ED, vital signs were stable, on room air.  Patient had mild hyponatremia with elevated creatinine levels.  Chest x-ray with no active cardiopulmonary disease process.  Troponins were negative.  Patient was then admitted to hospital for further evaluation and treatment.  At this time, patient has been awaiting for skilled nursing facility placement. Physical therapy has reevaluated the patient and considered unstable for disposition home.     Assessment & Plan:   Principal Problem:   Near syncope Active Problems:   Paroxysmal atrial fibrillation (HCC)   Dyslipidemia   Normocytic anemia   Diabetes mellitus type II, non insulin dependent (HCC)   CKD (chronic kidney disease) stage 4, GFR 15-29 ml/min (HCC)   Syncope and collapse   Protein-calorie malnutrition, severe  Near syncope likely secondary to orthostatic hypotension. No loss of consciousness or seizure-like activity. CT head/C-spine without contrast with no acute intracranial abnormality but hematoma of the right forehead noted.  2D echocardiogram was performed which showed preserved LV function with diastolic dysfunction and bioprosthetic valve.  Antihypertensives have been started at a reduced dose.  Continue physical therapy,  occupational therapy.  Confusion and agitation likely hospital induced delirium.  Likely has underlying baseline cognitive dysfunction with dementia.   On as needed Haldol.  EKG was normal.   MRI of the brain and CT head was negative for acute findings except for chronic microvascular ischemic changes.  Added low-dose Seroquel at nighttime since yesterday.  We will continue to monitor closely.    Acute kidney injury on CKD stage IV: Resolved Creatinine 2.54 on admission.    Latest creatinine of 2.2.  Continue to monitor BMP.    Paroxysmal atrial fibrillation Not a good candidate for anticoagulation.  Currently rate controlled.    Essential hypertension On  amlodipine '10mg'$  daily at home.  Patient was noted to be orthostatic on admission. Considering amlodipine 5 mg on discharge  Type 2 diabetes mellitus Hemoglobin A1C on 08/16/2020 was 7.1.  Continue sliding scale insulin, Accu-Cheks, diabetic diet.    Hx CVA Continue aspirin and statin  Hyperlipidemia: Continue statin  Anxiety/depression Continue Lexapro  Severe protein calorie malnutrition Present on admission.  Continue nutritional supplements.   Tobacco use disorder Continue nicotine patch. Patient is a heavy smoker  Cognitive impairment CT scan MRI showed cerebral atrophy noted on imaging study with prior history of CVA and microvascular ischemic changes.  Repeat MRI of the brain did not show any acute findings.  Aortic atherosclerosis Continue aspirin and statin  Debility, deconditioning, poor endurance.  At this time, patient has been seen by physical therapy and recommend skilled nursing facility patient.  Awaiting for disposition at this time.  Patient has high risk of fall.   DVT prophylaxis: Lovenox subcu    Code Status: Full Code   Family Communication:  None today.  I spoke with the patient's sister Ms.  Anne Ng on the phone yesterday.  Disposition Plan:  Status is: Inpatient  The patient is inpatient due to  treatments appropriate due to intensity of illness or inability to take PO, need for rehabilitation placement as per PT  Dispo: The patient is from: Home              Anticipated d/c is to: SNF as per PT, pending disposition.              Patient currently is medically stable to d/c.   Difficult to place patient No  Consultants:   none  Procedures:   TEE  Antimicrobials:   none  Subjective: Today, patient was seen and examined at bedside.  Patient is more alert awake and communicative oriented to time place and person at the time of my exam but as per nursing staff was confused agitated at times.  Objective: Vitals:   08/24/20 1816 08/24/20 2147 08/25/20 0541 08/25/20 0924  BP: (!) 168/96 136/65 136/68 (!) 157/63  Pulse: 72 67 65 63  Resp: '16 15 16 17  '$ Temp: 98.1 F (36.7 C) 97.7 F (36.5 C) 97.7 F (36.5 C) 98 F (36.7 C)  TempSrc:      SpO2: 95% 98% 98% 96%  Weight:   50.5 kg   Height:        Intake/Output Summary (Last 24 hours) at 08/25/2020 1300 Last data filed at 08/25/2020 1014 Gross per 24 hour  Intake 123 ml  Output 800 ml  Net -677 ml   Filed Weights   08/17/20 0550 08/18/20 2042 08/25/20 0541  Weight: 50.9 kg 50.8 kg 50.5 kg   Body mass index is 18.24 kg/m.  Physical examination: General:  Thinly built, not in obvious distress alert awake communicative answering appropriately.  Oriented to time place HENT:   No scleral pallor or icterus noted. Oral mucosa is moist.  Chest:  Clear breath sounds.  Diminished breath sounds bilaterally. No crackles or wheezes.  On a posey vest CVS: S1 &S2 heard. No murmur.  Regular rate and rhythm. Abdomen: Soft, nontender, nondistended.  Bowel sounds are heard.   Extremities: No cyanosis, clubbing or edema.  Peripheral pulses are palpable. Psych: Alert, awake and communicative, oriented to time place. CNS:  No cranial nerve deficits.  Power equal in all extremities.   Skin: Warm and dry.  No rashes noted.   Data  Reviewed: I have personally reviewed following labs and imaging studies  CBC: Recent Labs  Lab 08/22/20 0241 08/24/20 0332 08/25/20 0711  WBC 7.9 7.2 7.1  HGB 12.1 13.3 13.7  HCT 37.3 38.9 41.7  MCV 88.6 86.8 88.9  PLT 201 221 [id number]   Basic Metabolic Panel: Recent Labs  Lab 08/19/20 0143 08/20/20 0539 08/22/20 0241 08/24/20 0332 08/25/20 0711  NA 132*  --  135 135 136  K 4.4  --  4.4 4.9 4.4  CL 101  --  104 103 103  CO2 20*  --  21* 23 25  GLUCOSE 118*  --  138* 154* 137*  BUN 37*  --  46* 36* 37*  CREATININE 2.25* 2.58* 2.36* 2.27* 2.28*  CALCIUM 8.9  --  8.8* 9.3 9.5  MG  --   --   --  2.7* 2.9*  PHOS  --   --   --  3.2 3.8   GFR: Estimated Creatinine Clearance: 17 mL/min (A) (by C-G formula based on SCr of 2.28 mg/dL (H)). Liver Function Tests: Recent Labs  Lab 08/24/20 [phone]  AST 15  ALT 13  ALKPHOS 82  BILITOT 0.5  PROT 6.9  ALBUMIN 3.8   No results for input(s): LIPASE, AMYLASE in the last 168 hours. No results for input(s): AMMONIA in the last 168 hours. Coagulation Profile: No results for input(s): INR, PROTIME in the last 168 hours. Cardiac Enzymes: No results for input(s): CKTOTAL, CKMB, CKMBINDEX, TROPONINI in the last 168 hours. BNP (last 3 results) No results for input(s): PROBNP in the last 8760 hours. HbA1C: No results for input(s): HGBA1C in the last 72 hours. CBG: Recent Labs  Lab 08/19/20 0648 08/22/20 0722  GLUCAP 140* 138*   Lipid Profile: No results for input(s): CHOL, HDL, LDLCALC, TRIG, CHOLHDL, LDLDIRECT in the last 72 hours. Thyroid Function Tests: No results for input(s): TSH, T4TOTAL, FREET4, T3FREE, THYROIDAB in the last 72 hours. Anemia Panel: No results for input(s): VITAMINB12, FOLATE, FERRITIN, TIBC, IRON, RETICCTPCT in the last 72 hours. Sepsis Labs: No results for input(s): PROCALCITON, LATICACIDVEN in the last 168 hours.  Recent Results (from the past 240 hour(s))  SARS CORONAVIRUS 2 (TAT 6-24 HRS)  Nasopharyngeal Nasopharyngeal Swab     Status: None   Collection Time: 08/17/20  1:25 PM   Specimen: Nasopharyngeal Swab  Result Value Ref Range Status   SARS Coronavirus 2 NEGATIVE NEGATIVE Final    Comment: (NOTE) SARS-CoV-2 target nucleic acids are NOT DETECTED.  The SARS-CoV-2 RNA is generally detectable in upper and lower respiratory specimens during the acute phase of infection. Negative results do not preclude SARS-CoV-2 infection, do not rule out co-infections with other pathogens, and should not be used as the sole basis for treatment or other patient management decisions. Negative results must be combined with clinical observations, patient history, and epidemiological information. The expected result is Negative.  Fact Sheet for Patients: SugarRoll.be  Fact Sheet for Healthcare Providers: https://www.woods-mathews.com/  This test is not yet approved or cleared by the [place] FDA and  has been authorized for detection and/or diagnosis of SARS-CoV-2 by FDA under an Emergency Use Authorization (EUA). This EUA will remain  in effect (meaning this test can be used) for the duration of the COVID-19 declaration under Se ction 564(b)(1) of the Act, 21 U.S.C. section 360bbb-3(b)(1), unless the authorization is terminated or revoked sooner.  Performed at [facility], [place] [address] [place], [place] [address]        Radiology Studies: No results found.   Scheduled Meds: . amLODipine  5 mg Oral Daily  . aspirin EC  81 mg Oral Daily  . cholecalciferol  2,000 Units Oral Daily  . docusate sodium  100 mg Oral BID  . enoxaparin (LOVENOX) injection  30 mg Subcutaneous QHS  . escitalopram  5 mg Oral Daily  . feeding supplement  237 mL Oral TID BM  . multivitamin with minerals  1 tablet Oral Daily  . nicotine  21 mg Transdermal Daily  . polyethylene glycol  17 g Oral Daily  . pravastatin  20 mg Oral Daily  . QUEtiapine   25 mg Oral QHS  . senna-docusate  1 tablet Oral QHS  . sodium chloride flush  3 mL Intravenous Q12H  . cyanocobalamin  1,000 mcg Oral Daily   Continuous Infusions:   LOS: 8 days    Flora Lipps, MD [facility] 08/25/2020, 1:00 PM

## 2020-08-25 NOTE — Progress Notes (Signed)
Physical Therapy Treatment Patient Details Name: Sandra Sheppard MRN: [id number] DOB: April 23, 1946 Today's Date: 08/25/2020    History of Present Illness Pt adm 3/3 with near syncopal episode and falling in to the bushes when walking her dog. +orthostatics in ED. 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 Pt with confusion.  PMH - AAA, ckd, dm, htn, pvd,paf, avr.    PT Comments    Pt received in bed, willing to participate in PT. Generally min A-min guard for mobility. Assistance given to get into standing but once in standing fairly steady with B UE support. No physical assistance given for ambulation and no evidence of posterior/right lean. Pt able to follow commands consistently with increased time and responded appropriately to questions. Left in chair with all needs met, call bell within reach, chair alarm active, restraint applied, and RN aware of status. Will continue to monitor.    Follow Up Recommendations  SNF     Equipment Recommendations  Rolling walker with 5" wheels;3in1 (PT)    Recommendations for Other Services       Precautions / Restrictions Precautions Precautions: Fall Precaution Comments: monitor orthostatic BPs Restrictions Weight Bearing Restrictions: No    Mobility  Bed Mobility Overal bed mobility: Needs Assistance Bed Mobility: Supine to Sit     Supine to sit: Min assist;HOB elevated     General bed mobility comments: min A to elevate trunk, able to scoot foward without assistance    Transfers Overall transfer level: Needs assistance Equipment used: Rolling walker (2 wheeled) Transfers: Sit to/from Stand Sit to Stand: Min assist         General transfer comment: Min A for power up into standing, PT blocking R foot to ensure it didn't slip forward out from under pt  Ambulation/Gait Ambulation/Gait assistance: Min guard Gait Distance (Feet): 15 Feet Assistive device: Rolling walker (2 wheeled) Gait  Pattern/deviations: Step-through pattern;Decreased stride length;Narrow base of support Gait velocity: decreased   General Gait Details: Cues for upright posture. No evidence of right leaning   Stairs             Wheelchair Mobility    Modified Rankin (Stroke Patients Only)       Balance Overall balance assessment: Needs assistance Sitting-balance support: Feet supported Sitting balance-Leahy Scale: Fair     Standing balance support: Bilateral upper extremity supported;During functional activity Standing balance-Leahy Scale: Poor Standing balance comment: reliant on UE support                            Cognition Arousal/Alertness: Awake/alert Behavior During Therapy: Flat affect;Anxious Overall Cognitive Status: No family/caregiver present to determine baseline cognitive functioning Area of Impairment: Memory;Following commands;Safety/judgement;Awareness;Problem solving                 Orientation Level:  (NT) Current Attention Level: Sustained Memory: Decreased short-term memory Following Commands: Follows one step commands inconsistently (required repetition of instructions numerous times) Safety/Judgement: Decreased awareness of safety;Decreased awareness of deficits Awareness: Emergent Problem Solving: Requires verbal cues;Requires tactile cues;Slow processing;Difficulty sequencing General Comments: Cooperative today. Followed simple commands consistently with increased time      Exercises      General Comments        Pertinent Vitals/Pain Pain Assessment: No/denies pain    Home Living                      Prior Function  PT Goals (current goals can now be found in the care plan section) Acute Rehab PT Goals Patient Stated Goal: get home    Frequency    Min 3X/week      PT Plan Discharge plan needs to be updated    Co-evaluation              AM-PAC PT "6 Clicks" Mobility   Outcome  Measure  Help needed turning from your back to your side while in a flat bed without using bedrails?: None Help needed moving from lying on your back to sitting on the side of a flat bed without using bedrails?: A Little Help needed moving to and from a bed to a chair (including a wheelchair)?: A Little Help needed standing up from a chair using your arms (e.g., wheelchair or bedside chair)?: A Little Help needed to walk in hospital room?: A Little Help needed climbing 3-5 steps with a railing? : A Lot 6 Click Score: 18    End of Session Equipment Utilized During Treatment: Gait belt Activity Tolerance: Patient tolerated treatment well Patient left: with call bell/phone within reach;in chair;with chair alarm set;with restraints reapplied Nurse Communication: Mobility status PT Visit Diagnosis: Unsteadiness on feet (R26.81);Other abnormalities of gait and mobility (R26.89);Muscle weakness (generalized) (M62.81)     Time:  -     Charges:                       Rosita Kea, SPT

## 2020-08-26 DIAGNOSIS — E785 Hyperlipidemia, unspecified: Secondary | ICD-10-CM | POA: Diagnosis not present

## 2020-08-26 DIAGNOSIS — E119 Type 2 diabetes mellitus without complications: Secondary | ICD-10-CM | POA: Diagnosis not present

## 2020-08-26 DIAGNOSIS — N184 Chronic kidney disease, stage 4 (severe): Secondary | ICD-10-CM | POA: Diagnosis not present

## 2020-08-26 DIAGNOSIS — R55 Syncope and collapse: Secondary | ICD-10-CM | POA: Diagnosis not present

## 2020-08-26 LAB — GLUCOSE, CAPILLARY: Glucose-Capillary: 158 mg/dL — ABNORMAL HIGH (ref 70–99)

## 2020-08-26 NOTE — Progress Notes (Addendum)
PROGRESS NOTE    Sandra Sheppard  [id number] DOB: April 14, 1946 DOA: 08/13/2020 PCP: Prince Solian, MD    Brief Narrative:   Sandra Sheppard is a 75 year old female with past medical history significant for AAA, paroxysmal atrial fibrillation, carotid artery stenosis, aortic/mitral valve stenosis, CKD stage IV, type 2 diabetes mellitus, hyperlipidemia, essential hypertension, peripheral vascular disease, osteoporosis who presented to the ED following presyncopal episode. Patient reported being lightheaded, off balance and nearly passing out while walking her dog in her neighborhood on 08/13/2020. Patient was assisted to her feet by a neighbor and EMS was activated and subsequently taken to the ED. In the ED, vital signs were stable, on room air.  Patient had mild hyponatremia with elevated creatinine levels.  Chest x-ray with no active cardiopulmonary disease process.  Troponins were negative.  Patient was then admitted to hospital for further evaluation and treatment.  At this time, patient has been awaiting for skilled nursing facility placement. Physical therapy has reevaluated the patient and considered unstable for disposition home.  Currently awaiting for decision on skilled nursing facility.   Assessment & Plan:   Principal Problem:   Near syncope Active Problems:   Paroxysmal atrial fibrillation (HCC)   Dyslipidemia   Normocytic anemia   Diabetes mellitus type II, non insulin dependent (HCC)   CKD (chronic kidney disease) stage 4, GFR 15-29 ml/min (HCC)   Syncope and collapse   Protein-calorie malnutrition, severe  Near syncope likely secondary to orthostatic hypotension. No loss of consciousness or seizure-like activity. CT head/C-spine without contrast with no acute intracranial abnormality but hematoma of the right forehead noted.  2D echocardiogram was performed which showed preserved LV function with diastolic dysfunction and bioprosthetic valve.  Antihypertensives have been  started at a reduced dose.  Continue physical therapy, occupational therapy.  Confusion and agitation likely hospital induced delirium.  Likely has underlying baseline cognitive dysfunction with dementia.   On as needed Haldol.  EKG was normal.   MRI of the brain and CT head was negative for acute findings except for chronic microvascular ischemic changes.  Added low-dose Seroquel at nighttime with improved agitation.    Acute kidney injury on CKD stage IV:  Creatinine 2.54 on admission.    Latest creatinine of 2.6.  Continue to monitor BMP.    Paroxysmal atrial fibrillation Not a good candidate for anticoagulation.  Currently rate controlled.    Essential hypertension On  amlodipine '10mg'$  daily at home.  Patient was noted to be orthostatic on admission. Currently on 5 mg of amlodipine which will be continued on discharge  Type 2 diabetes mellitus Hemoglobin A1C on 08/16/2020 was 7.1.  Continue sliding scale insulin, Accu-Cheks, diabetic diet.  Latest POC glucose of 173.  Hx CVA Continue aspirin and statin  Hyperlipidemia: Continue statin  Anxiety/depression Continue Lexapro  Severe protein calorie malnutrition Present on admission.  Continue nutritional supplements.   Tobacco use disorder Continue nicotine patch. Patient is a heavy smoker  Cognitive impairment CT scan MRI showed cerebral atrophy noted on imaging study with prior history of CVA and microvascular ischemic changes.  Repeat MRI of the brain did not show any acute findings.  Aortic atherosclerosis Continue aspirin and statin  Debility, deconditioning, poor endurance.  At this time, patient has been seen by physical therapy and recommend skilled nursing facility patient.  Awaiting for disposition at this time.  Patient has high risk of fall.   DVT prophylaxis: Lovenox subcu    Code Status: Full Code   Family Communication:  None today.   Disposition Plan:  Status is: Inpatient  The patient is inpatient due  to treatments appropriate due to intensity of illness or inability to take PO, need for rehabilitation placement as per PT  Dispo: The patient is from: Home              Anticipated d/c is to: SNF as per PT, pending disposition.              Patient currently is medically stable to d/c.   Difficult to place patient No  Consultants:   none  Procedures:   TEE  Antimicrobials:   none  Subjective: Today, patient was seen and examined at bedside.  Disoriented to place but oriented to time and person.  Communicative.  Much calmer.  Objective: Vitals:   08/25/20 1755 08/25/20 2003 08/26/20 0447 08/26/20 1139  BP: 126/67 136/70 (!) 142/64 117/64  Pulse: 70 72 61 70  Resp: '17 18 18 16  '$ Temp: 98 F (36.7 C) 97.9 F (36.6 C) 98 F (36.7 C) (!) 97.5 F (36.4 C)  TempSrc:  Oral Oral Oral  SpO2: 94% 92% 97% 96%  Weight:      Height:        Intake/Output Summary (Last 24 hours) at 08/26/2020 1327 Last data filed at 08/25/2020 2200 Gross per 24 hour  Intake 480 ml  Output --  Net 480 ml   Filed Weights   08/17/20 0550 08/18/20 2042 08/25/20 0541  Weight: 50.9 kg 50.8 kg 50.5 kg   Body mass index is 18.24 kg/m.  Physical examination: General: Thinly built, not in obvious distress HENT:   No scleral pallor or icterus noted. Oral mucosa is moist.  Chest:  Clear breath sounds.  Diminished breath sounds bilaterally. No crackles or wheezes.  CVS: S1 &S2 heard. No murmur.  Regular rate and rhythm. Abdomen: Soft, nontender, nondistended.  Bowel sounds are heard.   Extremities: No cyanosis, clubbing or edema.  Peripheral pulses are palpable. Psych: Alert, awake and time and person.  Disoriented to place CNS:  No cranial nerve deficits.  Power equal in all extremities.   Skin: Warm and dry.  No rashes noted.    Data Reviewed: I have personally reviewed following labs and imaging studies  CBC: Recent Labs  Lab 08/22/20 0241 08/24/20 0332 08/25/20 0711  WBC 7.9 7.2 7.1   HGB 12.1 13.3 13.7  HCT 37.3 38.9 41.7  MCV 88.6 86.8 88.9  PLT 201 221 [id number]   Basic Metabolic Panel: Recent Labs  Lab 08/20/20 0539 08/22/20 0241 08/24/20 0332 08/25/20 0711  NA  --  135 135 136  K  --  4.4 4.9 4.4  CL  --  104 103 103  CO2  --  21* 23 25  GLUCOSE  --  138* 154* 137*  BUN  --  46* 36* 37*  CREATININE 2.58* 2.36* 2.27* 2.28*  CALCIUM  --  8.8* 9.3 9.5  MG  --   --  2.7* 2.9*  PHOS  --   --  3.2 3.8   GFR: Estimated Creatinine Clearance: 17 mL/min (A) (by C-G formula based on SCr of 2.28 mg/dL (H)). Liver Function Tests: Recent Labs  Lab 08/24/20 0332  AST 15  ALT 13  ALKPHOS 82  BILITOT 0.5  PROT 6.9  ALBUMIN 3.8   No results for input(s): LIPASE, AMYLASE in the last 168 hours. No results for input(s): AMMONIA in the last 168 hours. Coagulation Profile: No results for input(s):  INR, PROTIME in the last 168 hours. Cardiac Enzymes: No results for input(s): CKTOTAL, CKMB, CKMBINDEX, TROPONINI in the last 168 hours. BNP (last 3 results) No results for input(s): PROBNP in the last 8760 hours. HbA1C: No results for input(s): HGBA1C in the last 72 hours. CBG: Recent Labs  Lab 08/22/20 0722 08/26/20 0542  GLUCAP 138* 158*   Lipid Profile: No results for input(s): CHOL, HDL, LDLCALC, TRIG, CHOLHDL, LDLDIRECT in the last 72 hours. Thyroid Function Tests: No results for input(s): TSH, T4TOTAL, FREET4, T3FREE, THYROIDAB in the last 72 hours. Anemia Panel: No results for input(s): VITAMINB12, FOLATE, FERRITIN, TIBC, IRON, RETICCTPCT in the last 72 hours. Sepsis Labs: No results for input(s): PROCALCITON, LATICACIDVEN in the last 168 hours.  Recent Results (from the past 240 hour(s))  SARS CORONAVIRUS 2 (TAT 6-24 HRS) Nasopharyngeal Nasopharyngeal Swab     Status: None   Collection Time: 08/17/20  1:25 PM   Specimen: Nasopharyngeal Swab  Result Value Ref Range Status   SARS Coronavirus 2 NEGATIVE NEGATIVE Final    Comment: (NOTE) SARS-CoV-2  target nucleic acids are NOT DETECTED.  The SARS-CoV-2 RNA is generally detectable in upper and lower respiratory specimens during the acute phase of infection. Negative results do not preclude SARS-CoV-2 infection, do not rule out co-infections with other pathogens, and should not be used as the sole basis for treatment or other patient management decisions. Negative results must be combined with clinical observations, patient history, and epidemiological information. The expected result is Negative.  Fact Sheet for Patients: SugarRoll.be  Fact Sheet for Healthcare Providers: https://www.woods-mathews.com/  This test is not yet approved or cleared by the [place] FDA and  has been authorized for detection and/or diagnosis of SARS-CoV-2 by FDA under an Emergency Use Authorization (EUA). This EUA will remain  in effect (meaning this test can be used) for the duration of the COVID-19 declaration under Se ction 564(b)(1) of the Act, 21 U.S.C. section 360bbb-3(b)(1), unless the authorization is terminated or revoked sooner.  Performed at [facility], [place] [address], [place],  [address]        Radiology Studies: No results found.   Scheduled Meds: . amLODipine  5 mg Oral Daily  . aspirin EC  81 mg Oral Daily  . cholecalciferol  2,000 Units Oral Daily  . docusate sodium  100 mg Oral BID  . enoxaparin (LOVENOX) injection  30 mg Subcutaneous QHS  . escitalopram  5 mg Oral Daily  . feeding supplement  237 mL Oral TID BM  . multivitamin with minerals  1 tablet Oral Daily  . nicotine  21 mg Transdermal Daily  . polyethylene glycol  17 g Oral Daily  . pravastatin  20 mg Oral Daily  . QUEtiapine  25 mg Oral QHS  . senna-docusate  1 tablet Oral QHS  . sodium chloride flush  3 mL Intravenous Q12H  . cyanocobalamin  1,000 mcg Oral Daily   Continuous Infusions:   LOS: 9 days    Flora Lipps, MD [facility] 08/26/2020, 1:27 PM

## 2020-08-26 NOTE — Progress Notes (Signed)
OT Cancellation Note  Patient Details Name: Sandra Sheppard MRN: [id number] DOB: 01/21/1946   Cancelled Treatment:    Reason Eval/Treat Not Completed: Other (comment) Pt working with PT at this time. Will follow-up as time allows for OT session  Layla Maw 08/26/2020, 10:26 AM

## 2020-08-26 NOTE — Progress Notes (Signed)
Physical Therapy Treatment Patient Details Name: Sandra Sheppard MRN: [id number] DOB: May 17, 1946 Today's Date: 08/26/2020    History of Present Illness Pt adm 3/3 with near syncopal episode and falling in to the bushes when walking her dog. +orthostatics in ED. 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 Pt with confusion.  PMH - AAA, ckd, dm, htn, pvd,paf, avr.    PT Comments    Pt received in bed, willing to participate in PT. Generally supervision to min A for mobility with right posterior lean noted in standing but fair midline trunk orientation in sitting. Increased dizziness upon standing and tested positive for orthostatics although unable to complete all BP measurements due to dizziness/nausea. Pt returned to supine, where her BP stabilized to 146/60. Was able to tolerate LE strengthening exercises in bed. Pt left in bed with alarm active, call bell within reach, and RN aware of status.  Sitting BP = 106/56 Standing BP at 0 min = 87/56 Stood for 1-2 min before needing to sit, sitting BP = 74/53 Supine BP = 146/60    Follow Up Recommendations  SNF     Equipment Recommendations  Rolling walker with 5" wheels;3in1 (PT)    Recommendations for Other Services       Precautions / Restrictions Precautions Precautions: Fall Precaution Comments: monitor orthostatic BPs Restrictions Weight Bearing Restrictions: No    Mobility  Bed Mobility Overal bed mobility: Needs Assistance Bed Mobility: Supine to Sit     Supine to sit: Supervision Sit to supine: Min assist   General bed mobility comments: S for supine to sit with use of bed rails and increased time, min A to get LEs back into bed during sit to supine    Transfers Overall transfer level: Needs assistance Equipment used: Rolling walker (2 wheeled) Transfers: Sit to/from Stand Sit to Stand: Min assist         General transfer comment: Min A for power up into standing, PT blocking R  foot to ensure it didn't slip forward out from under pt  Ambulation/Gait             General Gait Details: deferred due to dizziness   Stairs             Wheelchair Mobility    Modified Rankin (Stroke Patients Only)       Balance Overall balance assessment: Needs assistance Sitting-balance support: Feet supported Sitting balance-Leahy Scale: Fair   Postural control: Posterior lean;Right lateral lean Standing balance support: Bilateral upper extremity supported;During functional activity Standing balance-Leahy Scale: Poor Standing balance comment: reliant on UE support                            Cognition Arousal/Alertness: Awake/alert Behavior During Therapy: Flat affect;Anxious Overall Cognitive Status: No family/caregiver present to determine baseline cognitive functioning Area of Impairment: Memory;Following commands;Safety/judgement;Awareness;Problem solving                 Orientation Level:  (NT) Current Attention Level: Sustained Memory: Decreased short-term memory Following Commands: Follows one step commands inconsistently (required repetition of instructions numerous times) Safety/Judgement: Decreased awareness of safety;Decreased awareness of deficits Awareness: Emergent Problem Solving: Requires verbal cues;Requires tactile cues;Slow processing;Difficulty sequencing General Comments: Cooperative today. Followed simple commands consistently with increased time      Exercises General Exercises - Lower Extremity Ankle Circles/Pumps: AROM;Both;10 reps Long Arc Quad: AROM;Both;10 reps Hip Flexion/Marching: AROM;Both;10 reps    General  Comments        Pertinent Vitals/Pain Pain Assessment: No/denies pain    Home Living                      Prior Function            PT Goals (current goals can now be found in the care plan section) Acute Rehab PT Goals Patient Stated Goal: get home    Frequency    Min  3X/week      PT Plan Current plan remains appropriate    Co-evaluation              AM-PAC PT "6 Clicks" Mobility   Outcome Measure  Help needed turning from your back to your side while in a flat bed without using bedrails?: None Help needed moving from lying on your back to sitting on the side of a flat bed without using bedrails?: A Little Help needed moving to and from a bed to a chair (including a wheelchair)?: A Little Help needed standing up from a chair using your arms (e.g., wheelchair or bedside chair)?: A Little Help needed to walk in hospital room?: A Little Help needed climbing 3-5 steps with a railing? : A Lot 6 Click Score: 18    End of Session Equipment Utilized During Treatment: Gait belt Activity Tolerance: Other (comment) (Limited by dizziness and BP) Patient left: in bed;with bed alarm set;with call bell/phone within reach Nurse Communication: Mobility status;Other (comment) (BP) PT Visit Diagnosis: Unsteadiness on feet (R26.81);Other abnormalities of gait and mobility (R26.89);Muscle weakness (generalized) (M62.81)     Time:  -     Charges:                       Rosita Kea, SPT

## 2020-08-27 DIAGNOSIS — R410 Disorientation, unspecified: Secondary | ICD-10-CM

## 2020-08-27 LAB — GLUCOSE, CAPILLARY: Glucose-Capillary: 173 mg/dL — ABNORMAL HIGH (ref 70–99)

## 2020-08-27 LAB — CREATININE, SERUM
Creatinine, Ser: 2.66 mg/dL — ABNORMAL HIGH (ref 0.44–1.00)
GFR, Estimated: 18 mL/min — ABNORMAL LOW (ref >60)

## 2020-08-27 NOTE — TOC Progression Note (Signed)
Transition of Care [facility]) - Progression Note    Patient Details  Name: SHYESHA CLIPPARD MRN: [id number] Date of Birth: 03/22/1946  Transition of Care [facility]) CM/SW Contact  Bartholomew Crews, RN Phone Number: [phone] 08/27/2020, 5:05 PM  Clinical Narrative:     After spending 2 hours on the phone with multiple transfers from department to department. Reference number provided by Mindy [phone] (ref# [id number]). SNF was denied d/t documentation not supporting daily skilled or nursing need. Discussed results of expedited appeal by MD on 08/20/2020, advised that there is no documentation of such an appeal.   Spoke with sister, Anne Ng, to advise of situation. Provided reference number and [employer] contact number. Anne Ng to call in AM to file an appeal.   Patient does not have anyone who can stay with her or who she can stay with in order to have 24h supervision.    Expected Discharge Plan: [place] Barriers to Discharge: Continued Medical Work up  Expected Discharge Plan and Services Expected Discharge Plan: [place] In-house Referral: Clinical Social Work     Living arrangements for the past 2 months: Single Family Home                                       Social Determinants of Health (SDOH) Interventions    Readmission Risk Interventions No flowsheet data found.

## 2020-08-27 NOTE — Care Management Important Message (Signed)
Important Message  Patient Details  Name: Sandra Sheppard MRN: [id number] Date of Birth: [birth year]   Medicare Important Message Given:  Yes     [name] Tanzie Rothschild 08/27/2020, 11:15 AM

## 2020-08-27 NOTE — Progress Notes (Signed)
Nutrition Follow-up  DOCUMENTATION CODES:   Severe malnutrition in context of chronic illness  INTERVENTION:  Magic cup BID with meals, each supplement provides 290 kcal and 9 grams of protein  Continue Ensure Enlive po TID, each supplement provides 350 kcal and 20 grams of protein  Continue MVI  NUTRITION DIAGNOSIS:   Severe Malnutrition related to chronic illness (CKD, cardiac illness) as evidenced by severe muscle depletion,severe fat depletion. --ongoing  GOAL:   Patient will meet greater than or equal to 90% of their needs--progressing  MONITOR:   PO intake,Supplement acceptance  REASON FOR ASSESSMENT:   Consult Assessment of nutrition requirement/status  ASSESSMENT:   75 yo female admitted with presyncopal episode. PMH includes mitral valve stenosis, PVD, HTN, HLD, A fib, CKD, CVD, osteoporosis, DM, smoker.  Pt continues to be pending discharge to SNF.   Pt's appetite has not changed much since last RD assessment. Meal completions charted as 25-100% completion x last 8 recorded meals. Per RN, pt continues to do well with Ensure TID. Continue current nutrition plan of care.   No UOP documented  Medications: vitamin D3, colace, mvi, miralax, senokot-s, vitamin b12 Labs reviewed.  Diet Order:   Diet Order            Diet regular Room service appropriate? Yes; Fluid consistency: Thin  Diet effective now                 EDUCATION NEEDS:   Education needs have been addressed (discussed maximizing protein and calorie intake with patient's family)  Skin:  Skin Assessment: Reviewed RN Assessment  Last BM:  3/13  Height:   Ht Readings from Last 1 Encounters:  08/13/20 5' 5.5" (1.664 m)    Weight:   Wt Readings from Last 1 Encounters:  08/27/20 49.4 kg    Ideal Body Weight:  58 kg  BMI:  Body mass index is 17.85 kg/m.  Estimated Nutritional Needs:   Kcal:  1550-1750  Protein:  70-80 gm  Fluid:  >/= 1.5 L  Larkin Ina, MS, RD, LDN RD  pager number and weekend/on-call pager number located in [place].

## 2020-08-28 LAB — BASIC METABOLIC PANEL
Anion gap: 10 (ref 5–15)
BUN: 51 mg/dL — ABNORMAL HIGH (ref 8–23)
CO2: 21 mmol/L — ABNORMAL LOW (ref 22–32)
Calcium: 9.2 mg/dL (ref 8.9–10.3)
Chloride: 105 mmol/L (ref 98–111)
Creatinine, Ser: 2.39 mg/dL — ABNORMAL HIGH (ref 0.44–1.00)
GFR, Estimated: 21 mL/min — ABNORMAL LOW (ref >60)
Glucose, Bld: 140 mg/dL — ABNORMAL HIGH (ref 70–99)
Potassium: 4.2 mmol/L (ref 3.5–5.1)
Sodium: 136 mmol/L (ref 135–145)

## 2020-08-28 LAB — CBC
HCT: 39 % (ref 36.0–46.0)
Hemoglobin: 12.7 g/dL (ref 12.0–15.0)
MCH: 28.7 pg (ref 26.0–34.0)
MCHC: 32.6 g/dL (ref 30.0–36.0)
MCV: 88 fL (ref 80.0–100.0)
Platelets: 210 10*3/uL (ref 150–400)
RBC: 4.43 MIL/uL (ref 3.87–5.11)
RDW: 13.1 % (ref 11.5–15.5)
WBC: 6.6 10*3/uL (ref 4.0–10.5)
nRBC: 0 % (ref 0.0–0.2)

## 2020-08-28 LAB — GLUCOSE, CAPILLARY: Glucose-Capillary: 121 mg/dL — ABNORMAL HIGH (ref 70–99)

## 2020-08-28 LAB — MAGNESIUM: Magnesium: 2.8 mg/dL — ABNORMAL HIGH (ref 1.7–2.4)

## 2020-08-28 LAB — PHOSPHORUS: Phosphorus: 4.3 mg/dL (ref 2.5–4.6)

## 2020-08-28 NOTE — Progress Notes (Signed)
PROGRESS NOTE    Sandra Sheppard  [id number] DOB: Oct 05, 1945 DOA: 08/13/2020 PCP: Prince Solian, MD    Brief Narrative:   Sandra Sheppard is a 75 year old female with past medical history significant for AAA, paroxysmal atrial fibrillation, carotid artery stenosis, aortic/mitral valve stenosis, CKD stage IV, type 2 diabetes mellitus, hyperlipidemia, essential hypertension, peripheral vascular disease, osteoporosis who presented to the ED following presyncopal episode. Patient reported being lightheaded, off balance and nearly passing out while walking her dog in her neighborhood on 08/13/2020. Patient was assisted to her feet by a neighbor and EMS was activated and subsequently taken to the ED. In the ED, vital signs were stable, on room air.  Patient had mild hyponatremia with elevated creatinine levels.  Chest x-ray with no active cardiopulmonary disease process.  Troponins were negative.  Patient was then admitted to hospital for further evaluation and treatment.  At this time, patient has been awaiting for skilled nursing facility placement. Physical therapy has reevaluated the patient and considered unstable for disposition home.  Discussion the interdisciplinary meeting.  Family was advised to appeal to the family  Assessment & Plan:   Principal Problem:   Near syncope Active Problems:   Paroxysmal atrial fibrillation (HCC)   Dyslipidemia   Normocytic anemia   Diabetes mellitus type II, non insulin dependent (HCC)   CKD (chronic kidney disease) stage 4, GFR 15-29 ml/min (HCC)   Syncope and collapse   Protein-calorie malnutrition, severe  Near syncope likely secondary to orthostatic hypotension. CT head scan C-spine was negative for acute intracranial abnormality but for head hematoma was noted.  2D echocardiogram showed preserved LV function.  Antihypertensive dose was decreased.  Physical therapy, occupational therapy recommend skilled nursing facility on discharge.  hospital  induced delirium.  Likely has underlying baseline cognitive dysfunction with dementia.   Has been started on Seroquel at nighttime with improved agitation.  Appears to be delirious at times.  Overall stable.  MRI of the brain and CT head scan was negative except for chronic microvascular changes.  Acute kidney injury on CKD stage IV:  Creatinine 2.54 on admission.    Latest creatinine of 2.3  Continue to monitor BMP.    Paroxysmal atrial fibrillation Not a good candidate for anticoagulation.  Currently rate controlled.    Essential hypertension On  amlodipine '10mg'$  daily at home.  Patient was noted to be orthostatic on admission. Amlodipine dose has been adjusted to  5 mgwhich will be continued on discharge  Type 2 diabetes mellitus Overall controlled.  Hemoglobin A1C on 08/16/2020 was 7.1.  Continue sliding scale insulin, Accu-Cheks, diabetic diet.  Latest POC glucose of 121  Hx CVA Continue aspirin and statin  Hyperlipidemia: Continue statin  Anxiety/depression Continue Lexapro  Severe protein calorie malnutrition Present on admission.  Continue nutritional supplements.   Tobacco use disorder Continue nicotine patch. Patient is a heavy smoker  Cognitive impairment CT scan MRI showed cerebral atrophy noted on imaging study with prior history of CVA and microvascular ischemic changes.  Repeat MRI of the brain did not show any acute findings.  Aortic atherosclerosis Continue aspirin and statin  Debility, deconditioning, poor endurance.  At this time, patient has been seen by physical therapy and recommend skilled nursing facility patient.  Awaiting for disposition at this time.    DVT prophylaxis: Lovenox subcu    Code Status: Full Code   Family Communication:  None today.   Disposition Plan:  Status is: Inpatient  The patient is inpatient due to treatments  appropriate due to intensity of illness or inability to take PO, need for skilled nursing facility placement as per  PT  Dispo: The patient is from: Home              Anticipated d/c is to: SNF as per PT, pending disposition.  Waiting for family to appeal as per TOC.              Patient currently is medically stable to d/c.   Difficult to place patient No  Consultants:   none  Procedures:   TEE  Antimicrobials:   none  Subjective: Today, patient was seen and examined at the bedside.  Patient is wondering why she could not go home.  She seems to be alert awake but occasionally gets disoriented.  Nursing staff reported episodes of mild delirium but no agitation  Objective: Vitals:   08/27/20 1856 08/27/20 2049    BP: 112/70 (!) 137/47    Pulse: 70 60    Resp: 15 16    Temp: 98.3 F (36.8 C) 98.1 F (36.7 C)    TempSrc: Oral     SpO2: 98% 98%    Weight:  50.5 kg    Height:        Filed Weights   08/25/20 0541 08/27/20 0510 08/27/20 2049  Weight: 50.5 kg 49.4 kg 50.5 kg   Body mass index is 18.24 kg/m.  Physical examination: General: Thinly built, not in obvious distress HENT:   No scleral pallor or icterus noted. Oral mucosa is moist.  Chest:   Diminished breath sounds bilaterally. No crackles or wheezes.  CVS: S1 &S2 heard. No murmur.  Regular rate and rhythm. Abdomen: Soft, nontender, nondistended.  Bowel sounds are heard.   Extremities: No cyanosis, clubbing or edema.  Peripheral pulses are palpable. Psych: Alert, awake and oriented to place and person.  Appears to be delirious at times CNS:  No cranial nerve deficits.  Power equal in all extremities.   Skin: Warm and dry.  No rashes noted.   Data Reviewed: I have personally reviewed following labs and imaging studies  CBC: Recent Labs  Lab 08/22/20 0241 08/24/20 0332 08/25/20 0711 08/28/20 0142  WBC 7.9 7.2 7.1 6.6  HGB 12.1 13.3 13.7 12.7  HCT 37.3 38.9 41.7 39.0  MCV 88.6 86.8 88.9 88.0  PLT 201 221 231 [id number]   Basic Metabolic Panel: Recent Labs  Lab 08/22/20 0241 08/24/20 0332 08/25/20 0711  08/27/20 0316 08/28/20 0142  NA 135 135 136  --  136  K 4.4 4.9 4.4  --  4.2  CL 104 103 103  --  105  CO2 21* 23 25  --  21*  GLUCOSE 138* 154* 137*  --  140*  BUN 46* 36* 37*  --  51*  CREATININE 2.36* 2.27* 2.28* 2.66* 2.39*  CALCIUM 8.8* 9.3 9.5  --  9.2  MG  --  2.7* 2.9*  --  2.8*  PHOS  --  3.2 3.8  --  4.3   GFR: Estimated Creatinine Clearance: 16.2 mL/min (A) (by C-G formula based on SCr of 2.39 mg/dL (H)). Liver Function Tests: Recent Labs  Lab 08/24/20 0332  AST 15  ALT 13  ALKPHOS 82  BILITOT 0.5  PROT 6.9  ALBUMIN 3.8   No results for input(s): LIPASE, AMYLASE in the last 168 hours. No results for input(s): AMMONIA in the last 168 hours. Coagulation Profile: No results for input(s): INR, PROTIME in the last 168 hours. Cardiac  Enzymes: No results for input(s): CKTOTAL, CKMB, CKMBINDEX, TROPONINI in the last 168 hours. BNP (last 3 results) No results for input(s): PROBNP in the last 8760 hours. HbA1C: No results for input(s): HGBA1C in the last 72 hours. CBG: Recent Labs  Lab 08/22/20 0722 08/26/20 0542 08/27/20 0634 08/28/20 0538  GLUCAP 138* 158* 173* 121*   Lipid Profile: No results for input(s): CHOL, HDL, LDLCALC, TRIG, CHOLHDL, LDLDIRECT in the last 72 hours. Thyroid Function Tests: No results for input(s): TSH, T4TOTAL, FREET4, T3FREE, THYROIDAB in the last 72 hours. Anemia Panel: No results for input(s): VITAMINB12, FOLATE, FERRITIN, TIBC, IRON, RETICCTPCT in the last 72 hours. Sepsis Labs: No results for input(s): PROCALCITON, LATICACIDVEN in the last 168 hours.  No results found for this or any previous visit (from the past 240 hour(s)).     Radiology Studies: No results found.   Scheduled Meds: . amLODipine  5 mg Oral Daily  . aspirin EC  81 mg Oral Daily  . cholecalciferol  2,000 Units Oral Daily  . docusate sodium  100 mg Oral BID  . enoxaparin (LOVENOX) injection  30 mg Subcutaneous QHS  . escitalopram  5 mg Oral Daily  .  feeding supplement  237 mL Oral TID BM  . multivitamin with minerals  1 tablet Oral Daily  . nicotine  21 mg Transdermal Daily  . polyethylene glycol  17 g Oral Daily  . pravastatin  20 mg Oral Daily  . QUEtiapine  25 mg Oral QHS  . senna-docusate  1 tablet Oral QHS  . sodium chloride flush  3 mL Intravenous Q12H  . cyanocobalamin  1,000 mcg Oral Daily   Continuous Infusions:   LOS: 11 days    Flora Lipps, MD [facility] 08/28/2020, 11:56 AM

## 2020-08-28 NOTE — Progress Notes (Signed)
Occupational Therapy Treatment Patient Details Name: Sandra Sheppard MRN: [id number] DOB: 1946/04/13 Today's Date: 08/28/2020    History of present illness Pt adm 3/3 with near syncopal episode and falling in to the bushes when walking her dog. +orthostatics in ED. 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 Pt with confusion.  PMH - AAA, ckd, dm, htn, pvd,paf, avr.   OT comments  Pt progressing towards OT goals, improved mentation noted during session today. Pt with no complaints of dizziness and BP WFL throughout session. Pt overall Min A for stand pivots to [facility] using RW progressing to min guard for mobility in hallway. Pt does benefit from cues for safe sequencing and hand placement, also with one R LOB ambulating requiring Min A to correct. Pt able to demo ADLs standing at sink with min guard. Continue to recommend SNF for short term rehab to maximize independence and safety with ADLs/mobility, as pt still has deficits in cognition, balance, and strength. Pt is at a high risk for falls due to functional deficits.    Follow Up Recommendations  SNF;Supervision/Assistance - 24 hour    Equipment Recommendations  3 in 1 bedside commode;Other (comment) (RW)    Recommendations for Other Services      Precautions / Restrictions Precautions Precautions: Fall Precaution Comments: monitor orthostatic BPs Restrictions Weight Bearing Restrictions: No       Mobility Bed Mobility Overal bed mobility: Needs Assistance Bed Mobility: Supine to Sit     Supine to sit: Supervision     General bed mobility comments: increased time, pt reports desire to complete this task without assist    Transfers Overall transfer level: Needs assistance Equipment used: Rolling walker (2 wheeled) Transfers: Sit to/from [facility] Sit to Stand: Min guard Stand pivot transfers: Min assist       General transfer comment: Min guard for safety in power up and  cues for hand placement. Min A for turning with RW as pt attempting to sit prematurely and leave RW behind    Balance Overall balance assessment: Needs assistance Sitting-balance support: Feet supported Sitting balance-Leahy Scale: Fair   Postural control: Right lateral lean;Posterior lean Standing balance support: Bilateral upper extremity supported;During functional activity Standing balance-Leahy Scale: Poor Standing balance comment: reliant on UE support                           ADL either performed or assessed with clinical judgement   ADL Overall ADL's : Needs assistance/impaired     Grooming: Min guard;Standing;Wash/dry hands;Wash/dry face Grooming Details (indicate cue type and reason): min guard for safety in standing                 Toilet Transfer: Minimal assistance;Stand-pivot;BSC;RW Toilet Transfer Details (indicate cue type and reason): Min A for pivot to [facility] with RW, cues needed to take RW with pt, maintain balance and cues for sequencing Toileting- Clothing Manipulation and Hygiene: Min guard;Sit to/from stand;Sitting/lateral lean Toileting - Clothing Manipulation Details (indicate cue type and reason): able to perform hygiene seated on BSC, min guard for clothing mgmt in standing     Functional mobility during ADLs: Min guard;Cueing for sequencing;Rolling walker General ADL Comments: Not reports of dizziness and BP WFL during session. Pt able to progress mobility,cognition and ADL performance today though continues to require hands on assist to correct balance at times     Vision   Vision Assessment?: No apparent visual  deficits   Perception     Praxis      Cognition Arousal/Alertness: Awake/alert Behavior During Therapy: WFL for tasks assessed/performed Overall Cognitive Status: Impaired/Different from baseline Area of Impairment: Memory;Following commands;Safety/judgement;Awareness;Problem solving                   Current  Attention Level: Selective Memory: Decreased short-term memory Following Commands: Follows one step commands with increased time;Follows multi-step commands with increased time Safety/Judgement: Decreased awareness of safety;Decreased awareness of deficits Awareness: Emergent Problem Solving: Requires verbal cues;Requires tactile cues;Slow processing;Difficulty sequencing General Comments: improving mentation, appropriate about situation and DC recommendations, etc. Pt requires consistent cues for hand placement and safety with DME. Decreased attention to tasks and benefits from cues to redirect. Pt did have 2 instances of confusion (thought she saw "weeds" on the floor though nothing there and when OT walked to other side of bed, pt thought there was a second person in room).        Exercises     Shoulder Instructions       General Comments BP WFL today.    Pertinent Vitals/ Pain       Pain Assessment: No/denies pain Pain Intervention(s): Monitored during session  Home Living                                          Prior Functioning/Environment              Frequency  Min 2X/week        Progress Toward Goals  OT Goals(current goals can now be found in the care plan section)  Progress towards OT goals: Progressing toward goals  Acute Rehab OT Goals Patient Stated Goal: get home OT Goal Formulation: With patient Time For Goal Achievement: 09/11/20 Potential to Achieve Goals: Good ADL Goals Pt Will Perform Grooming: with modified independence;standing Pt Will Perform Lower Body Bathing: with set-up;sit to/from stand Pt Will Perform Lower Body Dressing: with set-up;sit to/from stand Pt Will Transfer to Toilet: with modified independence;ambulating;regular height toilet Pt Will Perform Toileting - Clothing Manipulation and hygiene: with modified independence;sitting/lateral leans;sit to/from stand  Plan Discharge plan remains appropriate     Co-evaluation                 AM-PAC OT "6 Clicks" Daily Activity     Outcome Measure   Help from another person eating meals?: None Help from another person taking care of personal grooming?: A Little Help from another person toileting, which includes using toliet, bedpan, or urinal?: A Little Help from another person bathing (including washing, rinsing, drying)?: A Lot Help from another person to put on and taking off regular upper body clothing?: A Little Help from another person to put on and taking off regular lower body clothing?: A Little 6 Click Score: 18    End of Session Equipment Utilized During Treatment: Gait belt;Rolling walker  OT Visit Diagnosis: Unsteadiness on feet (R26.81);Other symptoms and signs involving cognitive function;History of falling (Z91.81)   Activity Tolerance Patient tolerated treatment well   Patient Left in chair;with call bell/phone within reach;with chair alarm set   Nurse Communication Mobility status        Time: [id number] OT Time Calculation (min): 35 min  Charges: OT General Charges $OT Visit: 1 Visit OT Treatments $Self Care/Home Management : 23-37 mins  Malachy Chamber, OTR/L Acute Rehab Services Office: [phone]  Layla Maw 08/28/2020, 1:08 PM

## 2020-08-28 NOTE — Progress Notes (Signed)
Physical Therapy Treatment Patient Details Name: Sandra Sheppard MRN: [id number] DOB: February 17, 1946 Today's Date: 08/28/2020    History of Present Illness Pt adm 3/3 with near syncopal episode and falling in to the bushes when walking her dog. +orthostatics in ED. 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 Pt with confusion.  PMH - AAA, ckd, dm, htn, pvd,paf, avr.    PT Comments    Pt received in bed, cooperative and pleasant. Generally min guard, but did need small boost into standing. Able to progress gait distance further. Previous right/posterior lean seen in standing, not evident today during ambulation or transfers. Cueing to stay on task and for navigation. Did not progress to standing exercises due to pt reporting fatigue. Left in chair with all needs met, call bell within reach, chair alarm active, and sisters present in room. Would continue to benefit from PT to address balance, endurance, and increase independency.   During session after returning to room from walking in hallway, pt said "when did you get here?". Her sisters were present in room but had been for some time. When asked who she was referring to, pt said she was saying hello to "the baby." Pt proceeded to talk to baby, who was no present in room. Both sisters witnessed conversation.   Follow Up Recommendations  SNF;Other (comment);Supervision/Assistance - 24 hour;Home health PT (Still recommend SNF, but if not possible, then home health with 24/7 supervision)     Equipment Recommendations  Rolling walker with 5" wheels;3in1 (PT)    Recommendations for Other Services       Precautions / Restrictions Precautions Precautions: Fall Precaution Comments: monitor orthostatic BPs Restrictions Weight Bearing Restrictions: No    Mobility  Bed Mobility Overal bed mobility: Needs Assistance Bed Mobility: Supine to Sit     Supine to sit: Supervision     General bed mobility comments:  increased time, cueing to scoot forward, no physical assist given    Transfers Overall transfer level: Needs assistance Equipment used: Rolling walker (2 wheeled) Transfers: Sit to/from Stand Sit to Stand: Min assist Stand pivot transfers: Min assist       General transfer comment: Min guard for safety in power up and cues for hand placement. Min A for turning with RW as pt attempting to sit prematurely and leave RW behind  Ambulation/Gait Ambulation/Gait assistance: Min guard Gait Distance (Feet): 100 Feet Assistive device: Rolling walker (2 wheeled) Gait Pattern/deviations: Step-through pattern;Decreased stride length;Narrow base of support Gait velocity: decreased   General Gait Details: Improved midline orientation, steady with RW, min guard for safety, some cues for navigation   Stairs             Wheelchair Mobility    Modified Rankin (Stroke Patients Only)       Balance Overall balance assessment: Needs assistance Sitting-balance support: Feet supported Sitting balance-Leahy Scale: Fair    Standing balance support: Bilateral upper extremity supported;During functional activity Standing balance-Leahy Scale: Poor Standing balance comment: reliant on UE support                            Cognition Arousal/Alertness: Awake/alert Behavior During Therapy: WFL for tasks assessed/performed Overall Cognitive Status: Impaired/Different from baseline Area of Impairment: Memory;Following commands;Safety/judgement;Awareness;Problem solving                   Current Attention Level: Sustained Memory: Decreased short-term memory Following Commands: Follows one step commands  with increased time;Follows multi-step commands with increased time Safety/Judgement: Decreased awareness of safety;Decreased awareness of deficits Awareness: Emergent Problem Solving: Requires verbal cues;Requires tactile cues;Slow processing;Difficulty sequencing General  Comments: One instance of confusion upon returning to room after ambulating in hallway, pt stating asking "when did you get here?" when asked who she was talking to, pt stated she was talking to "the baby" and proceed to talk about baby she saw that was not in room. Both of pt's sisters were present in room.      Exercises      General Comments General comments (skin integrity, edema, etc.): BP WFL today.      Pertinent Vitals/Pain Pain Assessment: No/denies pain Pain Intervention(s): Monitored during session    Home Living                      Prior Function            PT Goals (current goals can now be found in the care plan section) Acute Rehab PT Goals Patient Stated Goal: get home    Frequency    Min 3X/week      PT Plan Discharge plan needs to be updated    Co-evaluation              AM-PAC PT "6 Clicks" Mobility   Outcome Measure  Help needed turning from your back to your side while in a flat bed without using bedrails?: None Help needed moving from lying on your back to sitting on the side of a flat bed without using bedrails?: None Help needed moving to and from a bed to a chair (including a wheelchair)?: A Little Help needed standing up from a chair using your arms (e.g., wheelchair or bedside chair)?: A Little Help needed to walk in hospital room?: A Little Help needed climbing 3-5 steps with a railing? : A Lot 6 Click Score: 19    End of Session Equipment Utilized During Treatment: Gait belt Activity Tolerance: Patient tolerated treatment well Patient left: with call bell/phone within reach;in chair;with chair alarm set Nurse Communication: Mobility status PT Visit Diagnosis: Unsteadiness on feet (R26.81);Other abnormalities of gait and mobility (R26.89);Muscle weakness (generalized) (M62.81)     Time:  -     Charges:              Rosita Kea, SPT

## 2020-08-28 NOTE — TOC Progression Note (Signed)
Transition of Care [facility]) - Progression Note    Patient Details  Name: Sandra Sheppard MRN: [id number] Date of Birth: 05/21/1946  [facility] CM/SW Contact  Bartholomew Crews, RN Phone Number: [phone] 08/28/2020, 5:36 PM  Clinical Narrative:     Discussed barriers to transition with supervisor, Olga Coaster, who provided assistance with contacting [facility] concerning SNF authorization [id number]. Received message from [employer] with call back number [phone]; however, no one available at number during call back. Finally spoke with representative who was able to fax denial letter with [employer] appeal information to [phone] (case management office). [employer] appeal received and clinical documents faxed to [phone] Stage manager Fax). Spoke with sister, Sandra Sheppard, who had also spoken with [employer] earlier today - advised Sandra Sheppard of pending [employer] Appeal. TOC to follow up.   Expected Discharge Plan: [place] Barriers to Discharge: Continued Medical Work up  Expected Discharge Plan and Services Expected Discharge Plan: [place] In-house Referral: Clinical Social Work     Living arrangements for the past 2 months: Single Family Home                                       Social Determinants of Health (SDOH) Interventions    Readmission Risk Interventions No flowsheet data found.

## 2020-08-28 NOTE — Progress Notes (Signed)
Late entry PROGRESS NOTE    Sandra Sheppard  [id number] DOB: 12/08/1945 DOA: 08/13/2020 PCP: Prince Solian, MD    Brief Narrative:   Sandra Sheppard is a 75 year old female with past medical history significant for AAA, paroxysmal atrial fibrillation, carotid artery stenosis, aortic/mitral valve stenosis, CKD stage IV, type 2 diabetes mellitus, hyperlipidemia, essential hypertension, peripheral vascular disease, osteoporosis who presented to the ED following presyncopal episode. Patient reported being lightheaded, off balance and nearly passing out while walking her dog in her neighborhood on 08/13/2020. Patient was assisted to her feet by a neighbor and EMS was activated and subsequently taken to the ED. In the ED, vital signs were stable, on room air.  Patient had mild hyponatremia with elevated creatinine levels.  Chest x-ray with no active cardiopulmonary disease process.  Troponins were negative.  Patient was then admitted to hospital for further evaluation and treatment.  At this time, patient has been awaiting for skilled nursing facility placement. Physical therapy has reevaluated the patient and considered unstable for disposition home.  Currently awaiting for decision on skilled nursing facility.   Assessment & Plan:   Principal Problem:   Near syncope Active Problems:   Paroxysmal atrial fibrillation (HCC)   Dyslipidemia   Normocytic anemia   Diabetes mellitus type II, non insulin dependent (HCC)   CKD (chronic kidney disease) stage 4, GFR 15-29 ml/min (HCC)   Syncope and collapse   Protein-calorie malnutrition, severe  Near syncope likely secondary to orthostatic hypotension. No loss of consciousness or seizure-like activity. CT head/C-spine without contrast with no acute intracranial abnormality but hematoma of the right forehead noted.  2D echocardiogram was performed which showed preserved LV function with diastolic dysfunction and bioprosthetic valve.  Antihypertensives  have been started at a reduced dose.  Continue physical therapy, occupational therapy.  Confusion and agitation likely hospital induced delirium.  Likely has underlying baseline cognitive dysfunction with dementia.   On as needed Haldol.  EKG was normal.   MRI of the brain and CT head was negative for acute findings except for chronic microvascular ischemic changes.  Added low-dose Seroquel at nighttime with improved agitation.    Acute kidney injury on CKD stage IV:  Creatinine 2.54 on admission.    Latest creatinine of 2.6.  Continue to monitor BMP.    Paroxysmal atrial fibrillation Not a good candidate for anticoagulation.  Currently rate controlled.    Essential hypertension On  amlodipine '10mg'$  daily at home.  Patient was noted to be orthostatic on admission. Currently on 5 mg of amlodipine which will be continued on discharge  Type 2 diabetes mellitus Hemoglobin A1C on 08/16/2020 was 7.1.  Continue sliding scale insulin, Accu-Cheks, diabetic diet.    Hx CVA Continue aspirin and statin  Hyperlipidemia: Continue statin  Anxiety/depression Continue Lexapro  Severe protein calorie malnutrition Present on admission.  Continue nutritional supplements.   Tobacco use disorder Continue nicotine patch. Patient is a heavy smoker  Cognitive impairment CT scan MRI showed cerebral atrophy noted on imaging study with prior history of CVA and microvascular ischemic changes.  Repeat MRI of the brain did not show any acute findings.  Aortic atherosclerosis Continue aspirin and statin  Debility, deconditioning, poor endurance.  At this time, patient has been seen by physical therapy and recommend skilled nursing facility patient.  Awaiting for disposition at this time.    DVT prophylaxis: Lovenox subcu    Code Status: Full Code   Family Communication:  None today.   Disposition Plan:  Status is: Inpatient  The patient is inpatient due to treatments appropriate due to intensity of  illness or inability to take PO, need for rehabilitation placement as per PT  Dispo: The patient is from: Home              Anticipated d/c is to: SNF as per PT, pending disposition.              Patient currently is medically stable to d/c.   Difficult to place patient No  Consultants:   none  Procedures:   TEE  Antimicrobials:   none  Subjective: Today, patient was seen and examined present.  Patient is disoriented but denies any dizziness lightheadedness shortness of breath chest pain  Objective: Vitals:   08/27/20 1856 08/27/20 2049    BP: 112/70 (!) 137/47    Pulse: 70 60    Resp: 15 16    Temp: 98.3 F (36.8 C) 98.1 F (36.7 C)    TempSrc: Oral     SpO2: 98% 98%    Weight:  50.5 kg    Height:        Filed Weights   08/25/20 0541 08/27/20 0510 08/27/20 2049  Weight: 50.5 kg 49.4 kg 50.5 kg   Body mass index is 18.24 kg/m.  Physical examination: General: Thinly built, not in obvious distress HENT:   No scleral pallor or icterus noted. Oral mucosa is moist.  Chest:  Clear breath sounds.  Diminished breath sounds bilaterally. No crackles or wheezes.  CVS: S1 &S2 heard. No murmur.  Regular rate and rhythm. Abdomen: Soft, nontender, nondistended.  Bowel sounds are heard.   Extremities: No cyanosis, clubbing or edema.  Peripheral pulses are palpable. Psych: Alert, awake and time and person.  Disoriented to place CNS:  No cranial nerve deficits.  Power equal in all extremities.   Skin: Warm and dry.  No rashes noted.  Data Reviewed: I have personally reviewed following labs and imaging studies  CBC: Recent Labs  Lab 08/22/20 0241 08/24/20 0332 08/25/20 0711 08/28/20 0142  WBC 7.9 7.2 7.1 6.6  HGB 12.1 13.3 13.7 12.7  HCT 37.3 38.9 41.7 39.0  MCV 88.6 86.8 88.9 88.0  PLT 201 221 231 [id number]   Basic Metabolic Panel: Recent Labs  Lab 08/22/20 0241 08/24/20 0332 08/25/20 0711 08/27/20 0316 08/28/20 0142  NA 135 135 136  --  136  K 4.4 4.9 4.4  --   4.2  CL 104 103 103  --  105  CO2 21* 23 25  --  21*  GLUCOSE 138* 154* 137*  --  140*  BUN 46* 36* 37*  --  51*  CREATININE 2.36* 2.27* 2.28* 2.66* 2.39*  CALCIUM 8.8* 9.3 9.5  --  9.2  MG  --  2.7* 2.9*  --  2.8*  PHOS  --  3.2 3.8  --  4.3   GFR: Estimated Creatinine Clearance: 16.2 mL/min (A) (by C-G formula based on SCr of 2.39 mg/dL (H)). Liver Function Tests: Recent Labs  Lab 08/24/20 0332  AST 15  ALT 13  ALKPHOS 82  BILITOT 0.5  PROT 6.9  ALBUMIN 3.8   No results for input(s): LIPASE, AMYLASE in the last 168 hours. No results for input(s): AMMONIA in the last 168 hours. Coagulation Profile: No results for input(s): INR, PROTIME in the last 168 hours. Cardiac Enzymes: No results for input(s): CKTOTAL, CKMB, CKMBINDEX, TROPONINI in the last 168 hours. BNP (last 3 results) No results for input(s): PROBNP  in the last 8760 hours. HbA1C: No results for input(s): HGBA1C in the last 72 hours. CBG: Recent Labs  Lab 08/22/20 0722 08/26/20 0542 08/27/20 0634 08/28/20 0538  GLUCAP 138* 158* 173* 121*   Lipid Profile: No results for input(s): CHOL, HDL, LDLCALC, TRIG, CHOLHDL, LDLDIRECT in the last 72 hours. Thyroid Function Tests: No results for input(s): TSH, T4TOTAL, FREET4, T3FREE, THYROIDAB in the last 72 hours. Anemia Panel: No results for input(s): VITAMINB12, FOLATE, FERRITIN, TIBC, IRON, RETICCTPCT in the last 72 hours. Sepsis Labs: No results for input(s): PROCALCITON, LATICACIDVEN in the last 168 hours.  No results found for this or any previous visit (from the past 240 hour(s)).     Radiology Studies: No results found.   Scheduled Meds: . amLODipine  5 mg Oral Daily  . aspirin EC  81 mg Oral Daily  . cholecalciferol  2,000 Units Oral Daily  . docusate sodium  100 mg Oral BID  . enoxaparin (LOVENOX) injection  30 mg Subcutaneous QHS  . escitalopram  5 mg Oral Daily  . feeding supplement  237 mL Oral TID BM  . multivitamin with minerals  1  tablet Oral Daily  . nicotine  21 mg Transdermal Daily  . polyethylene glycol  17 g Oral Daily  . pravastatin  20 mg Oral Daily  . QUEtiapine  25 mg Oral QHS  . senna-docusate  1 tablet Oral QHS  . sodium chloride flush  3 mL Intravenous Q12H  . cyanocobalamin  1,000 mcg Oral Daily   Continuous Infusions:   LOS: 11 days    Flora Lipps, MD [facility] 08/28/2020, 11:53 AM

## 2020-08-29 DIAGNOSIS — F419 Anxiety disorder, unspecified: Secondary | ICD-10-CM

## 2020-08-29 DIAGNOSIS — F32A Depression, unspecified: Secondary | ICD-10-CM

## 2020-08-29 LAB — GLUCOSE, CAPILLARY: Glucose-Capillary: 126 mg/dL — ABNORMAL HIGH (ref 70–99)

## 2020-08-29 NOTE — Plan of Care (Signed)
  Problem: Education: Goal: Knowledge of General Education information will improve Description: Including pain rating scale, medication(s)/side effects and non-pharmacologic comfort measures Outcome: Progressing   Problem: Health Behavior/Discharge Planning: Goal: Ability to manage health-related needs will improve Outcome: Progressing   Problem: Clinical Measurements: Goal: Ability to maintain clinical measurements within normal limits will improve Outcome: Progressing Goal: Will remain free from infection Outcome: Progressing Goal: Diagnostic test results will improve Outcome: Progressing Goal: Respiratory complications will improve Outcome: Progressing Goal: Cardiovascular complication will be avoided Outcome: Progressing   Problem: Activity: Goal: Risk for activity intolerance will decrease Outcome: Progressing   Problem: Nutrition: Goal: Adequate nutrition will be maintained Outcome: Progressing   Problem: Coping: Goal: Level of anxiety will decrease Outcome: Progressing   Problem: Elimination: Goal: Will not experience complications related to bowel motility Outcome: Progressing Goal: Will not experience complications related to urinary retention Outcome: Progressing   Problem: Pain Managment: Goal: General experience of comfort will improve Outcome: Progressing   Problem: Safety: Goal: Ability to remain free from injury will improve Outcome: Progressing   Problem: Skin Integrity: Goal: Risk for impaired skin integrity will decrease Outcome: Progressing   Problem: Education: Goal: Knowledge of condition and prescribed therapy will improve Outcome: Progressing   Problem: Cardiac: Goal: Will achieve and/or maintain adequate cardiac output Outcome: Progressing   Problem: Physical Regulation: Goal: Complications related to the disease process, condition or treatment will be avoided or minimized Outcome: Progressing   Problem: Safety: Goal:  Non-violent Restraint(s) Outcome: Progressing

## 2020-08-29 NOTE — Progress Notes (Signed)
PROGRESS NOTE    Sandra Sheppard  [id number] DOB: 05/25/1946 DOA: 08/13/2020 PCP: Prince Solian, MD   Brief Narrative:  Sandra Sheppard is a 75 year old female with past medical history significant for AAA, paroxysmal atrial fibrillation, carotid artery stenosis, aortic/mitral valve stenosis, CKD stage IV, type 2 diabetes mellitus, hyperlipidemia, essential hypertension, peripheral vascular disease, osteoporosis who presented to the ED following presyncopal episode. Patient reported being lightheaded, off balance and nearly passing out while walking her dog in her neighborhood on 08/13/2020. Patient was assisted to her feet by a neighbor and EMS was activated and subsequently taken to the ED. In the ED, vital signs were stable, on room air.  Patient had mild hyponatremia with elevated creatinine levels.  Chest x-ray with no active cardiopulmonary disease process.  Troponins were negative.  Patient was then admitted to hospital for further evaluation and treatment.  At this time, patient has been awaiting for skilled nursing facility placement. Physical therapy has reevaluated the patient and considered unstable for disposition home.  Discussion the interdisciplinary meeting.  Family was advised to appeal to the family   Assessment & Plan:   Principal Problem:   Near syncope Active Problems:   Paroxysmal atrial fibrillation (HCC)   Dyslipidemia   Normocytic anemia   Diabetes mellitus type II, non insulin dependent (HCC)   CKD (chronic kidney disease) stage 4, GFR 15-29 ml/min (HCC)   Syncope and collapse   Protein-calorie malnutrition, severe   Near syncope likely secondary to orthostatic hypotension. CT head scan C-spine was negative for acute intracranial abnormality but for head hematoma was noted.  2D echocardiogram showed preserved LV function.  Antihypertensive dose was decreased.  Physical therapy, occupational therapy recommend skilled nursing facility on discharge-STILL  PURSUING  hospital induced delirium.  Likely has underlying baseline cognitive dysfunction with dementia.   Has been started on Seroquel at nighttime with improved agitation.  Appears to be delirious at times.  Overall stable.  MRI of the brain and CT head scan was negative except for chronic microvascular changes.  Acute kidney injury on CKD stage IV:  Creatinine 2.54 on admission.    Latest creatinine of 2.39  Continue to monitor BMP.    Paroxysmal atrial fibrillation Not a good candidate for anticoagulation.  Currently rate controlled.    Essential hypertension On  amlodipine '10mg'$  daily at home.  Patient was noted to be orthostatic on admission. Amlodipine dose has been adjusted to  5 mgwhich will be continued on discharge  Type 2 diabetes mellitus Overall controlled.  Hemoglobin A1C on 08/16/2020 was 7.1.  Continue sliding scale insulin, Accu-Cheks, diabetic diet.  Hx CVA Continue aspirin and statin  Hyperlipidemia: Continue statin  Anxiety/depression Continue Lexapro  Severe protein calorie malnutrition Present on admission.  Continue nutritional supplements.   Tobacco use disorder Continue nicotine patch. Patient is a heavy smoker  Cognitive impairment CT scan MRI showed cerebral atrophy noted on imaging study with prior history of CVA and microvascular ischemic changes.  Repeat MRI of the brain did not show any acute findings.  Aortic atherosclerosis Continue aspirin and statin  Debility, deconditioning, poor endurance.  At this time, patient has been seen by physical therapy and recommend skilled nursing facility patient.  Awaiting for disposition at this time.    DVT prophylaxis: Lovenox SQ  Code Status: FULL    Code Status Orders  (From admission, onward)         Start     Ordered   08/13/20 2339  Full code  Continuous        08/13/20 2340        Code Status History    Date Active Date Inactive Code Status Order ID Comments User Context    08/13/2020 2340 08/13/2020 2340 Full Code [id number]  Reubin Milan, MD ED   08/08/2018 2017 08/31/2018 1809 Partial Code [id number]  Vianne Bulls, MD ED   08/08/2018 2004 08/08/2018 2017 Full Code [id number]  Vianne Bulls, MD ED   07/04/2016 2149 07/05/2016 1816 Partial Code [id number]  Lily Kocher, MD ED   Advance Care Planning Activity    Advance Directive Documentation   Flowsheet Row Most Recent Value  Type of Advance Directive Healthcare Power of Attorney, Living will  Pre-existing out of facility DNR order (yellow form or pink MOST form) --  "MOST" Form in Place? --     Family Communication: CALLED SISTER, NA LEFT VM Disposition Plan:    Status is: Inpatient  The patient is inpatient due to treatments appropriate due to intensity of illness or inability to take PO, need for skilled nursing facility placement as per PT  Dispo: The patient is from: Home  Anticipated d/c is to: SNF as per PT, pending disposition.  Waiting for family to appeal as per TOC.  Patient currently is medically stable to d/c.              Difficult to place patient No Consults called: None Admission status: Inpatient   Consultants:   None  Procedures:  CT Head Wo Contrast  Result Date: 08/13/2020 CLINICAL DATA:  Mental status change EXAM: CT HEAD WITHOUT CONTRAST TECHNIQUE: Contiguous axial images were obtained from the base of the skull through the vertex without intravenous contrast. COMPARISON:  October 09, 2019 FINDINGS: Brain: No evidence of acute territorial infarction, hemorrhage, hydrocephalus,extra-axial collection or mass lesion/mass effect. There is dilatation the ventricles and sulci consistent with age-related atrophy. Low-attenuation changes in the deep white matter consistent with small vessel ischemia. Vascular: No hyperdense vessel or unexpected calcification. Skull: The skull is intact. No fracture or focal lesion identified. Sinuses/Orbits: The visualized  paranasal sinuses and mastoid air cells are clear. The orbits and globes intact. Other: None IMPRESSION: No acute intracranial abnormality. Findings consistent with age related atrophy and chronic small vessel ischemia Electronically Signed   By: Prudencio Pair M.D.   On: 08/13/2020 18:23   MR BRAIN WO CONTRAST  Result Date: 08/21/2020 CLINICAL DATA:  Dizziness EXAM: MRI HEAD WITHOUT CONTRAST TECHNIQUE: Multiplanar, multiecho pulse sequences of the brain and surrounding structures were obtained without intravenous contrast. COMPARISON:  08/13/2020 FINDINGS: Brain: No acute infarct, mass effect or extra-axial collection. 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 Confluent hyperintense T2-weighted white matter signal. Generalized volume loss without a clear lobar predilection. The midline structures are normal. Old small vessel infarct of the right basal ganglia and left cerebellum. Vascular: Major flow voids are preserved. Skull and upper cervical spine: Normal calvarium and skull base. Visualized upper cervical spine and soft tissues are normal. Sinuses/Orbits:No paranasal sinus fluid levels or advanced mucosal thickening. No mastoid or middle ear effusion. Normal orbits. IMPRESSION: 1. No acute intracranial abnormality. 2. Confluent hyperintense T2-weighted white matter signal, consistent with chronic microvascular ischemia. 3. Old small vessel infarcts of the right basal ganglia and left cerebellum. Electronically Signed   By: Ulyses Jarred M.D.   On: 08/21/2020 02:18   MR BRAIN WO CONTRAST  Result Date: 08/13/2020 CLINICAL DATA:  Initial evaluation for neuro deficit, stroke suspected. EXAM: MRI  HEAD WITHOUT CONTRAST TECHNIQUE: Multiplanar, multiecho pulse sequences of the brain and surrounding structures were obtained without intravenous contrast. COMPARISON:  Prior CT from earlier the same day. FINDINGS: Brain: Examination severely limited due to extensive motion artifact. Generalized age-related  cerebral atrophy. Patchy and confluent T2/FLAIR hyperintensity within the periventricular and deep white matter both cerebral hemispheres, most consistent with chronic small vessel ischemic disease, moderate to advanced in nature. Remote lacunar infarct present at the right basal ganglia. Probable additional small remote left cerebellar infarct. No abnormal foci of restricted diffusion to suggest acute or subacute ischemia. Gray-white matter differentiation maintained. No encephalomalacia to suggest chronic cortical infarction. No visible acute intracranial hemorrhage. Possible small foci of chronic hemosiderin deposition at the left cerebellum and right temporal occipital region. No acute hemorrhage seen on prior CT. No mass lesion, midline shift or mass effect. No hydrocephalus or extra-axial fluid collection. Pituitary gland suprasellar region within normal limits. Midline structures intact. Vascular: Major intracranial vascular flow voids are grossly preserved at the skull base. Skull and upper cervical spine: Craniocervical junction within normal limits. Bone marrow signal intensity normal. No scalp soft tissue abnormality. Sinuses/Orbits: Globes and orbital soft tissues grossly within normal limits. Paranasal sinuses are largely clear. No appreciable mastoid effusion. Other: None. IMPRESSION: 1. Technically limited exam due to extensive motion artifact. 2. No acute intracranial abnormality. 3. Age-related cerebral atrophy with moderate to advanced chronic microvascular ischemic disease, with remote lacunar infarcts at the right basal ganglia and left cerebellum. Electronically Signed   By: Jeannine Boga M.D.   On: 08/13/2020 22:45   DG Chest Port 1 View  Result Date: 08/13/2020 CLINICAL DATA:  Syncope. EXAM: PORTABLE CHEST 1 VIEW COMPARISON:  July 04, 2016. FINDINGS: The heart size and mediastinal contours are within normal limits. Both lungs are clear. No pneumothorax or pleural effusion is  noted. The visualized skeletal structures are unremarkable. IMPRESSION: No active disease. Aortic Atherosclerosis (ICD10-I70.0). Electronically Signed   By: Marijo Conception M.D.   On: 08/13/2020 16:51   ECHOCARDIOGRAM COMPLETE  Result Date: 08/14/2020    ECHOCARDIOGRAM REPORT   Patient Name:   ANDREY ALWOOD Date of Exam: 08/14/2020 Medical Rec #:  [id number]      Height:       65.5 in Accession #:    [id number]     Weight:       116.0 lb Date of Birth:  05-09-1946       BSA:          1.578 m Patient Age:    42 years       BP:           148/82 mmHg Patient Gender: F              HR:           65 bpm. Exam Location:  Inpatient Procedure: 2D Echo, 3D Echo, Cardiac Doppler, Color Doppler and Strain Analysis Indications:    Syncope  History:        Patient has prior history of Echocardiogram examinations, most                 recent 05/26/2017. H/O Bio AVR and Aortic Valve Disease,                 Arrythmias:Atrial Fibrillation; Risk Factors:Dyslipidemia.                 Aortic Valve: 19 mm Edwards MagnaEase bioprosthetic valve is  present in the aortic position. Procedure Date: 08/14/2008.  Sonographer:    Luisa Hart RDCS Referring Phys: [id number] [place]  1. Left ventricular ejection fraction, by estimation, is 65 to 70%. The left ventricle has normal function. The left ventricle has no regional wall motion abnormalities. Left ventricular diastolic parameters are consistent with Grade I diastolic dysfunction (impaired relaxation).  2. Right ventricular systolic function is mildly reduced. The right ventricular size is normal. There is normal pulmonary artery systolic pressure.  3. The mitral valve is abnormal. No evidence of mitral valve regurgitation.  4. The aortic valve has been repaired/replaced. Aortic valve regurgitation is not visualized. There is a 19 mm Edwards MagnaEase bioprosthetic valve present in the aortic position. Procedure Date: 08/14/2008. Aortic valve area, by VTI  measures 0.94 cm. Aortic valve mean gradient measures 16.5 mmHg.  5. The inferior vena cava is normal in size with greater than 50% respiratory variability, suggesting right atrial pressure of 3 mmHg. Comparison(s): Changes from prior study are noted. 05/26/2017: LVEF 60-65%, mean AOV gradient 17 mmHg. FINDINGS  Left Ventricle: Left ventricular ejection fraction, by estimation, is 65 to 70%. The left ventricle has normal function. The left ventricle has no regional wall motion abnormalities. The left ventricular internal cavity size was small. There is no left ventricular hypertrophy. Left ventricular diastolic parameters are consistent with Grade I diastolic dysfunction (impaired relaxation). Indeterminate filling pressures. Right Ventricle: The right ventricular size is normal. No increase in right ventricular wall thickness. Right ventricular systolic function is mildly reduced. There is normal pulmonary artery systolic pressure. The tricuspid regurgitant velocity is 2.14 m/s, and with an assumed right atrial pressure of 3 mmHg, the estimated right ventricular systolic pressure is [id number] mmHg. Left Atrium: Left atrial size was normal in size. Right Atrium: Right atrial size was normal in size. Pericardium: There is no evidence of pericardial effusion. Mitral Valve: The mitral valve is abnormal. Mild to moderate mitral annular calcification. No evidence of mitral valve regurgitation. Tricuspid Valve: The tricuspid valve is grossly normal. Tricuspid valve regurgitation is mild. Aortic Valve: The aortic valve has been repaired/replaced. Aortic valve regurgitation is not visualized. Aortic valve mean gradient measures 16.5 mmHg. Aortic valve peak gradient measures 24.5 mmHg. Aortic valve area, by VTI measures 0.94 cm. There is a  19 mm Edwards MagnaEase bioprosthetic valve present in the aortic position. Procedure Date: 08/14/2008. Pulmonic Valve: The pulmonic valve was normal in structure. Pulmonic valve regurgitation  is not visualized. Aorta: The aortic root and ascending aorta are structurally normal, with no evidence of dilitation. Venous: The inferior vena cava is normal in size with greater than 50% respiratory variability, suggesting right atrial pressure of 3 mmHg. IAS/Shunts: The interatrial septum is aneurysmal. No atrial level shunt detected by color flow Doppler.  LEFT VENTRICLE PLAX 2D LVIDd:         4.00 cm  Diastology LVIDs:         1.80 cm  LV e' medial:    6.08 cm/s LV PW:         1.00 cm  LV E/e' medial:  12.0 LV IVS:        1.10 cm  LV e' lateral:   8.27 cm/s LVOT diam:     2.10 cm  LV E/e' lateral: 8.8 LV SV:         61 LV SV Index:   38 LVOT Area:     3.46 cm  RIGHT VENTRICLE RV S prime:  8.68 cm/s TAPSE (M-mode): 1.1 cm LEFT ATRIUM           Index       RIGHT ATRIUM           Index LA diam:      3.20 cm 2.03 cm/m  RA Area:     12.60 cm LA Vol (A2C): 10.6 ml 6.72 ml/m  RA Volume:   31.70 ml  20.09 ml/m LA Vol (A4C): 42.0 ml 26.61 ml/m  AORTIC VALVE                    PULMONIC VALVE AV Area (Vmax):    0.92 cm     PV Vmax:       0.61 m/s AV Area (Vmean):   0.85 cm     PV Peak grad:  1.5 mmHg AV Area (VTI):     0.94 cm AV Vmax:           247.50 cm/s AV Vmean:          192.000 cm/s AV VTI:            0.644 m AV Peak Grad:      24.5 mmHg AV Mean Grad:      16.5 mmHg LVOT Vmax:         65.80 cm/s LVOT Vmean:        46.900 cm/s LVOT VTI:          0.175 m LVOT/AV VTI ratio: 0.27  AORTA Ao Root diam: 2.80 cm Ao Asc diam:  3.05 cm MITRAL VALVE               TRICUSPID VALVE MV Area (PHT): 4.15 cm    TR Peak grad:   18.3 mmHg MV Decel Time: 183 msec    TR Vmax:        214.00 cm/s MV E velocity: 72.80 cm/s MV A velocity: 82.00 cm/s  SHUNTS MV E/A ratio:  0.89        Systemic VTI:  0.18 m                            Systemic Diam: 2.10 cm Lyman Bishop MD Electronically signed by Lyman Bishop MD Signature Date/Time: 08/14/2020/11:31:48 AM    Final      Antimicrobials:   NONE    Subjective: RESTING IN  BEDSIDE CHAIR, NO EVENTS OVERNIGHT  Objective: Vitals:   08/29/20 0424 08/29/20 0620 08/29/20 0955 08/29/20 1000  BP: 140/61  (!) 149/70 139/80  Pulse: 70  (!) 51 63  Resp: '18  16 15  '$ Temp: 97.9 F (36.6 C)  97.8 F (36.6 C) 97.7 F (36.5 C)  TempSrc: Oral  Oral Oral  SpO2: 97%  97% 96%  Weight:  47.8 kg    Height:        Intake/Output Summary (Last 24 hours) at 08/29/2020 1308 Last data filed at 08/29/2020 1243 Gross per 24 hour  Intake 357 ml  Output --  Net 357 ml   Filed Weights   08/27/20 0510 08/27/20 2049 08/29/20 0620  Weight: 49.4 kg 50.5 kg 47.8 kg    Examination:  General exam: Appears calm and comfortable  Respiratory system: Clear to auscultation. Respiratory effort normal. Cardiovascular system: S1 & S2 heard, RRR. No JVD, murmurs, rubs, gallops or clicks. No pedal edema. Gastrointestinal system: Abdomen is nondistended, soft and nontender. No organomegaly or masses felt. Normal bowel sounds heard. Central nervous  system: Alert and oriented. No focal neurological deficits.globally weak Extremities: thin, no acute edema, wwp Skin: No rashes, lesions or ulcers Psychiatry: Judgement and insight appear normal. Mood & affect appropriate.     Data Reviewed: I have personally reviewed following labs and imaging studies  CBC: Recent Labs  Lab 08/24/20 0332 08/25/20 0711 08/28/20 0142  WBC 7.2 7.1 6.6  HGB 13.3 13.7 12.7  HCT 38.9 41.7 39.0  MCV 86.8 88.9 88.0  PLT 221 231 [id number]   Basic Metabolic Panel: Recent Labs  Lab 08/24/20 0332 08/25/20 0711 08/27/20 0316 08/28/20 0142  NA 135 136  --  136  K 4.9 4.4  --  4.2  CL 103 103  --  105  CO2 23 25  --  21*  GLUCOSE 154* 137*  --  140*  BUN 36* 37*  --  51*  CREATININE 2.27* 2.28* 2.66* 2.39*  CALCIUM 9.3 9.5  --  9.2  MG 2.7* 2.9*  --  2.8*  PHOS 3.2 3.8  --  4.3   GFR: Estimated Creatinine Clearance: 15.3 mL/min (A) (by C-G formula based on SCr of 2.39 mg/dL (H)). Liver Function  Tests: Recent Labs  Lab 08/24/20 0332  AST 15  ALT 13  ALKPHOS 82  BILITOT 0.5  PROT 6.9  ALBUMIN 3.8   No results for input(s): LIPASE, AMYLASE in the last 168 hours. No results for input(s): AMMONIA in the last 168 hours. Coagulation Profile: No results for input(s): INR, PROTIME in the last 168 hours. Cardiac Enzymes: No results for input(s): CKTOTAL, CKMB, CKMBINDEX, TROPONINI in the last 168 hours. BNP (last 3 results) No results for input(s): PROBNP in the last 8760 hours. HbA1C: No results for input(s): HGBA1C in the last 72 hours. CBG: Recent Labs  Lab 08/26/20 0542 08/27/20 0634 08/28/20 0538 08/29/20 0443  GLUCAP 158* 173* 121* 126*   Lipid Profile: No results for input(s): CHOL, HDL, LDLCALC, TRIG, CHOLHDL, LDLDIRECT in the last 72 hours. Thyroid Function Tests: No results for input(s): TSH, T4TOTAL, FREET4, T3FREE, THYROIDAB in the last 72 hours. Anemia Panel: No results for input(s): VITAMINB12, FOLATE, FERRITIN, TIBC, IRON, RETICCTPCT in the last 72 hours. Sepsis Labs: No results for input(s): PROCALCITON, LATICACIDVEN in the last 168 hours.  No results found for this or any previous visit (from the past 240 hour(s)).       Radiology Studies: No results found.      Scheduled Meds: . amLODipine  5 mg Oral Daily  . aspirin EC  81 mg Oral Daily  . cholecalciferol  2,000 Units Oral Daily  . docusate sodium  100 mg Oral BID  . enoxaparin (LOVENOX) injection  30 mg Subcutaneous QHS  . escitalopram  5 mg Oral Daily  . feeding supplement  237 mL Oral TID BM  . multivitamin with minerals  1 tablet Oral Daily  . nicotine  21 mg Transdermal Daily  . polyethylene glycol  17 g Oral Daily  . pravastatin  20 mg Oral Daily  . QUEtiapine  25 mg Oral QHS  . senna-docusate  1 tablet Oral QHS  . sodium chloride flush  3 mL Intravenous Q12H  . cyanocobalamin  1,000 mcg Oral Daily   Continuous Infusions:   LOS: 12 days    Time spent: 35  min    Nicolette Bang, MD [facility]  If 7PM-7AM, please contact night-coverage  08/29/2020, 1:08 PM

## 2020-08-30 LAB — GLUCOSE, CAPILLARY: Glucose-Capillary: 183 mg/dL — ABNORMAL HIGH (ref 70–99)

## 2020-08-30 NOTE — Progress Notes (Signed)
PROGRESS NOTE    KAIJA BIONDI  [id number] DOB: 10-Jun-1946 DOA: 08/13/2020 PCP: Prince Solian, MD   Brief Narrative:  Milus Glazier a 75 year old female with past medical history significant for AAA, paroxysmal atrial fibrillation, carotid artery stenosis, aortic/mitral valve stenosis, CKD stage IV, type 2 diabetes mellitus, hyperlipidemia, essential hypertension, peripheral vascular disease, osteoporosis who presented to the ED following presyncopal episode. Patient reported being lightheaded, off balance and nearly passing out while walking her dog in her neighborhood on 08/13/2020. Patient was assisted to her feet by a neighbor and EMS was activated and subsequently taken to the ED. In the ED, vital signs were stable, on room air. Patient had mild hyponatremia with elevated creatinine levels. Chest x-ray with no active cardiopulmonary disease process. Troponins were negative. Patient was then admitted to hospital for further evaluation and treatment.  At this time, patient has been awaiting for skilled nursing facility placement. Physical therapy has reevaluated the patient and considered unstable for disposition home.Discussion the interdisciplinary meeting. Family was advised to appeal to the family   Assessment & Plan:   Principal Problem:   Near syncope Active Problems:   Paroxysmal atrial fibrillation (HCC)   Dyslipidemia   Normocytic anemia   Diabetes mellitus type II, non insulin dependent (HCC)   CKD (chronic kidney disease) stage 4, GFR 15-29 ml/min (HCC)   Syncope and collapse   Protein-calorie malnutrition, severe   Near syncope likely secondary to orthostatic hypotension. CT head scan C-spine was negative for acute intracranial abnormality but for head hematoma was noted. 2D echocardiogram showed preserved LV function. Antihypertensive dose was decreased. Physical therapy, occupational therapy recommend skilled nursing facility on [place]  hospital induced delirium. Likely has underlying baseline cognitive dysfunction with dementia. Has been started on Seroquel at nighttime with improved agitation. Appears to be delirious at times. Overall stable. MRI of the brain and CT head scan was negative except for chronic microvascular changes.  Acute kidney injury on CKD stage IV:  Creatinine 2.54 on admission. Latest creatinine of 2.39Continue to monitor BMP.   Paroxysmal atrial fibrillation Not a good candidate for anticoagulation. Currently rate controlled.   Essential hypertension On amlodipine '10mg'$  daily at home. Patient was noted to be orthostatic on admission. Amlodipine dose has been adjusted to5 mgwhich will be continued on discharge  Type 2 diabetes mellitus Overall controlled.Hemoglobin A1C on 08/16/2020 was 7.1. Continue sliding scale insulin, Accu-Cheks, diabetic diet.  Hx CVA Continue aspirin and statin  Hyperlipidemia:Continue statin  Anxiety/depression Continue Lexapro  Severe protein calorie malnutrition Present on admission. Continue nutritional supplements.   Tobacco use disorder Continue nicotine patch. Patient is a heavy smoker  Cognitive impairment CT scan MRI showed cerebral atrophy noted on imaging study with prior history of CVA and microvascular ischemic changes. Repeat MRI of the brain did not show any acute findings.  Aortic atherosclerosis Continue aspirin and statin  Debility, deconditioning, poor endurance.  At this time, patient has been seen by physical therapy and recommend skilled nursing facility patient. Awaiting for disposition at this time.   DVT prophylaxis: Lovenox SQ  Code Status: FULL    Code Status Orders  (From admission, onward)         Start     Ordered   08/13/20 2339  Full code  Continuous        08/13/20 2340        Code Status History    Date Active Date Inactive Code Status Order ID Comments  User Context   08/13/2020 2340 08/13/2020 2340 Full Code [id number]  Reubin Milan, MD ED   08/08/2018 2017 08/31/2018 1809 Partial Code [id number]  Vianne Bulls, MD ED   08/08/2018 2004 08/08/2018 2017 Full Code [id number]  Vianne Bulls, MD ED   07/04/2016 2149 07/05/2016 1816 Partial Code [id number]  Lily Kocher, MD ED   Advance Care Planning Activity    Advance Directive Documentation   Flowsheet Row Most Recent Value  Type of Advance Directive Healthcare Power of Attorney, Living will  Pre-existing out of facility DNR order (yellow form or pink MOST form) --  "MOST" Form in Place? --     Family Communication: NONE TODAY Disposition Plan:    Status is: Inpatient  The patient is inpatient due to treatments appropriate due to intensity of illness or inability to take PO, need forskilled nursing facilityplacement as per PT  Dispo: The patient is from: Home Anticipated d/c is [id number] per PT, pending disposition.Waiting for family to appeal as per TOC. Patient currently is medically stable to d/c. Difficult to place patient No Consults called: None Admission status: Inpatient   Consultants:   None  Procedures:  CT Head Wo Contrast  Result Date: 08/13/2020 CLINICAL DATA:  Mental status change EXAM: CT HEAD WITHOUT CONTRAST TECHNIQUE: Contiguous axial images were obtained from the base of the skull through the vertex without intravenous contrast. COMPARISON:  October 09, 2019 FINDINGS: Brain: No evidence of acute territorial infarction, hemorrhage, hydrocephalus,extra-axial collection or mass lesion/mass effect. There is dilatation the ventricles and sulci consistent with age-related atrophy. Low-attenuation changes in the deep white matter consistent with small vessel ischemia. Vascular: No hyperdense vessel or unexpected calcification. Skull: The skull is intact. No fracture or focal lesion identified. Sinuses/Orbits: The visualized  paranasal sinuses and mastoid air cells are clear. The orbits and globes intact. Other: None IMPRESSION: No acute intracranial abnormality. Findings consistent with age related atrophy and chronic small vessel ischemia Electronically Signed   By: Prudencio Pair M.D.   On: 08/13/2020 18:23   MR BRAIN WO CONTRAST  Result Date: 08/21/2020 CLINICAL DATA:  Dizziness EXAM: MRI HEAD WITHOUT CONTRAST TECHNIQUE: Multiplanar, multiecho pulse sequences of the brain and surrounding structures were obtained without intravenous contrast. COMPARISON:  08/13/2020 FINDINGS: Brain: No acute infarct, mass effect or extra-axial collection. 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 Confluent hyperintense T2-weighted white matter signal. Generalized volume loss without a clear lobar predilection. The midline structures are normal. Old small vessel infarct of the right basal ganglia and left cerebellum. Vascular: Major flow voids are preserved. Skull and upper cervical spine: Normal calvarium and skull base. Visualized upper cervical spine and soft tissues are normal. Sinuses/Orbits:No paranasal sinus fluid levels or advanced mucosal thickening. No mastoid or middle ear effusion. Normal orbits. IMPRESSION: 1. No acute intracranial abnormality. 2. Confluent hyperintense T2-weighted white matter signal, consistent with chronic microvascular ischemia. 3. Old small vessel infarcts of the right basal ganglia and left cerebellum. Electronically Signed   By: Ulyses Jarred M.D.   On: 08/21/2020 02:18   MR BRAIN WO CONTRAST  Result Date: 08/13/2020 CLINICAL DATA:  Initial evaluation for neuro deficit, stroke suspected. EXAM: MRI HEAD WITHOUT CONTRAST TECHNIQUE: Multiplanar, multiecho pulse sequences of the brain and surrounding structures were obtained without intravenous contrast. COMPARISON:  Prior CT from earlier the same day. FINDINGS: Brain: Examination severely limited due to extensive motion artifact. Generalized age-related  cerebral atrophy. Patchy and confluent T2/FLAIR hyperintensity within the periventricular and deep white matter both cerebral  hemispheres, most consistent with chronic small vessel ischemic disease, moderate to advanced in nature. Remote lacunar infarct present at the right basal ganglia. Probable additional small remote left cerebellar infarct. No abnormal foci of restricted diffusion to suggest acute or subacute ischemia. Gray-white matter differentiation maintained. No encephalomalacia to suggest chronic cortical infarction. No visible acute intracranial hemorrhage. Possible small foci of chronic hemosiderin deposition at the left cerebellum and right temporal occipital region. No acute hemorrhage seen on prior CT. No mass lesion, midline shift or mass effect. No hydrocephalus or extra-axial fluid collection. Pituitary gland suprasellar region within normal limits. Midline structures intact. Vascular: Major intracranial vascular flow voids are grossly preserved at the skull base. Skull and upper cervical spine: Craniocervical junction within normal limits. Bone marrow signal intensity normal. No scalp soft tissue abnormality. Sinuses/Orbits: Globes and orbital soft tissues grossly within normal limits. Paranasal sinuses are largely clear. No appreciable mastoid effusion. Other: None. IMPRESSION: 1. Technically limited exam due to extensive motion artifact. 2. No acute intracranial abnormality. 3. Age-related cerebral atrophy with moderate to advanced chronic microvascular ischemic disease, with remote lacunar infarcts at the right basal ganglia and left cerebellum. Electronically Signed   By: Jeannine Boga M.D.   On: 08/13/2020 22:45   DG Chest Port 1 View  Result Date: 08/13/2020 CLINICAL DATA:  Syncope. EXAM: PORTABLE CHEST 1 VIEW COMPARISON:  July 04, 2016. FINDINGS: The heart size and mediastinal contours are within normal limits. Both lungs are clear. No pneumothorax or pleural effusion is  noted. The visualized skeletal structures are unremarkable. IMPRESSION: No active disease. Aortic Atherosclerosis (ICD10-I70.0). Electronically Signed   By: Marijo Conception M.D.   On: 08/13/2020 16:51   ECHOCARDIOGRAM COMPLETE  Result Date: 08/14/2020    ECHOCARDIOGRAM REPORT   Patient Name:   YUKTA AVINO Date of Exam: 08/14/2020 Medical Rec #:  [id number]      Height:       65.5 in Accession #:    [id number]     Weight:       116.0 lb Date of Birth:  [birth year]       BSA:          1.578 m Patient Age:    24 years       BP:           148/82 mmHg Patient Gender: F              HR:           65 bpm. Exam Location:  Inpatient Procedure: 2D Echo, 3D Echo, Cardiac Doppler, Color Doppler and Strain Analysis Indications:    Syncope  History:        Patient has prior history of Echocardiogram examinations, most                 recent 05/26/2017. H/O Bio AVR and Aortic Valve Disease,                 Arrythmias:Atrial Fibrillation; Risk Factors:Dyslipidemia.                 Aortic Valve: 19 mm Edwards MagnaEase bioprosthetic valve is                 present in the aortic position. Procedure Date: 08/14/2008.  Sonographer:    Luisa Hart RDCS Referring Phys: [id number] [place]  1. Left ventricular ejection fraction, by estimation, is 65 to 70%. The left ventricle has normal function. The left ventricle has no  regional wall motion abnormalities. Left ventricular diastolic parameters are consistent with Grade I diastolic dysfunction (impaired relaxation).  2. Right ventricular systolic function is mildly reduced. The right ventricular size is normal. There is normal pulmonary artery systolic pressure.  3. The mitral valve is abnormal. No evidence of mitral valve regurgitation.  4. The aortic valve has been repaired/replaced. Aortic valve regurgitation is not visualized. There is a 19 mm Edwards MagnaEase bioprosthetic valve present in the aortic position. Procedure Date: 08/14/2008. Aortic valve area, by VTI  measures 0.94 cm. Aortic valve mean gradient measures 16.5 mmHg.  5. The inferior vena cava is normal in size with greater than 50% respiratory variability, suggesting right atrial pressure of 3 mmHg. Comparison(s): Changes from prior study are noted. 05/26/2017: LVEF 60-65%, mean AOV gradient 17 mmHg. FINDINGS  Left Ventricle: Left ventricular ejection fraction, by estimation, is 65 to 70%. The left ventricle has normal function. The left ventricle has no regional wall motion abnormalities. The left ventricular internal cavity size was small. There is no left ventricular hypertrophy. Left ventricular diastolic parameters are consistent with Grade I diastolic dysfunction (impaired relaxation). Indeterminate filling pressures. Right Ventricle: The right ventricular size is normal. No increase in right ventricular wall thickness. Right ventricular systolic function is mildly reduced. There is normal pulmonary artery systolic pressure. The tricuspid regurgitant velocity is 2.14 m/s, and with an assumed right atrial pressure of 3 mmHg, the estimated right ventricular systolic pressure is [id number] mmHg. Left Atrium: Left atrial size was normal in size. Right Atrium: Right atrial size was normal in size. Pericardium: There is no evidence of pericardial effusion. Mitral Valve: The mitral valve is abnormal. Mild to moderate mitral annular calcification. No evidence of mitral valve regurgitation. Tricuspid Valve: The tricuspid valve is grossly normal. Tricuspid valve regurgitation is mild. Aortic Valve: The aortic valve has been repaired/replaced. Aortic valve regurgitation is not visualized. Aortic valve mean gradient measures 16.5 mmHg. Aortic valve peak gradient measures 24.5 mmHg. Aortic valve area, by VTI measures 0.94 cm. There is a  19 mm Edwards MagnaEase bioprosthetic valve present in the aortic position. Procedure Date: 08/14/2008. Pulmonic Valve: The pulmonic valve was normal in structure. Pulmonic valve regurgitation  is not visualized. Aorta: The aortic root and ascending aorta are structurally normal, with no evidence of dilitation. Venous: The inferior vena cava is normal in size with greater than 50% respiratory variability, suggesting right atrial pressure of 3 mmHg. IAS/Shunts: The interatrial septum is aneurysmal. No atrial level shunt detected by color flow Doppler.  LEFT VENTRICLE PLAX 2D LVIDd:         4.00 cm  Diastology LVIDs:         1.80 cm  LV e' medial:    6.08 cm/s LV PW:         1.00 cm  LV E/e' medial:  12.0 LV IVS:        1.10 cm  LV e' lateral:   8.27 cm/s LVOT diam:     2.10 cm  LV E/e' lateral: 8.8 LV SV:         61 LV SV Index:   38 LVOT Area:     3.46 cm  RIGHT VENTRICLE RV S prime:     8.68 cm/s TAPSE (M-mode): 1.1 cm LEFT ATRIUM           Index       RIGHT ATRIUM           Index LA diam:      3.20  cm 2.03 cm/m  RA Area:     12.60 cm LA Vol (A2C): 10.6 ml 6.72 ml/m  RA Volume:   31.70 ml  20.09 ml/m LA Vol (A4C): 42.0 ml 26.61 ml/m  AORTIC VALVE                    PULMONIC VALVE AV Area (Vmax):    0.92 cm     PV Vmax:       0.61 m/s AV Area (Vmean):   0.85 cm     PV Peak grad:  1.5 mmHg AV Area (VTI):     0.94 cm AV Vmax:           247.50 cm/s AV Vmean:          192.000 cm/s AV VTI:            0.644 m AV Peak Grad:      24.5 mmHg AV Mean Grad:      16.5 mmHg LVOT Vmax:         65.80 cm/s LVOT Vmean:        46.900 cm/s LVOT VTI:          0.175 m LVOT/AV VTI ratio: 0.27  AORTA Ao Root diam: 2.80 cm Ao Asc diam:  3.05 cm MITRAL VALVE               TRICUSPID VALVE MV Area (PHT): 4.15 cm    TR Peak grad:   18.3 mmHg MV Decel Time: 183 msec    TR Vmax:        214.00 cm/s MV E velocity: 72.80 cm/s MV A velocity: 82.00 cm/s  SHUNTS MV E/A ratio:  0.89        Systemic VTI:  0.18 m                            Systemic Diam: 2.10 cm Lyman Bishop MD Electronically signed by Lyman Bishop MD Signature Date/Time: 08/14/2020/11:31:48 AM    Final      Antimicrobials:   NONE    Subjective: Resting in  bed comfortably this morning Events overnight  Objective: Vitals:   08/29/20 1639 08/29/20 2100 08/30/20 0549 08/30/20 0914  BP: (!) 147/64 124/82 104/69 (!) 117/57  Pulse: 68 72 72 68  Resp: '16 14 14 16  '$ Temp: 98.2 F (36.8 C) 97.6 F (36.4 C) 97.8 F (36.6 C) 98.2 F (36.8 C)  TempSrc: Oral Oral Oral Oral  SpO2: 98% 98% 97% 94%  Weight:      Height:        Intake/Output Summary (Last 24 hours) at 08/30/2020 1354 Last data filed at 08/30/2020 1300 Gross per 24 hour  Intake 694 ml  Output 350 ml  Net 344 ml   Filed Weights   08/27/20 0510 08/27/20 2049 08/29/20 0620  Weight: 49.4 kg 50.5 kg 47.8 kg    Examination:  General exam: Appears calm and comfortable  Respiratory system: Clear to auscultation. Respiratory effort normal. Cardiovascular system: S1 & S2 heard, RRR. No JVD, murmurs, rubs, gallops or clicks. No pedal edema. Gastrointestinal system: Abdomen is nondistended, soft and nontender. No organomegaly or masses felt. Normal bowel sounds heard. Central nervous system: Alert and oriented. No focal neurological deficits. Extremities: Thin again no acute edema warm well perfused Skin: No rashes, lesions or ulcers Psychiatry: Judgement and insight appear normal. Mood & affect appropriate.     Data Reviewed: I have personally  reviewed following labs and imaging studies  CBC: Recent Labs  Lab 08/24/20 0332 08/25/20 0711 08/28/20 0142  WBC 7.2 7.1 6.6  HGB 13.3 13.7 12.7  HCT 38.9 41.7 39.0  MCV 86.8 88.9 88.0  PLT 221 231 [id number]   Basic Metabolic Panel: Recent Labs  Lab 08/24/20 0332 08/25/20 0711 08/27/20 0316 08/28/20 0142  NA 135 136  --  136  K 4.9 4.4  --  4.2  CL 103 103  --  105  CO2 23 25  --  21*  GLUCOSE 154* 137*  --  140*  BUN 36* 37*  --  51*  CREATININE 2.27* 2.28* 2.66* 2.39*  CALCIUM 9.3 9.5  --  9.2  MG 2.7* 2.9*  --  2.8*  PHOS 3.2 3.8  --  4.3   GFR: Estimated Creatinine Clearance: 15.3 mL/min (A) (by C-G formula based on  SCr of 2.39 mg/dL (H)). Liver Function Tests: Recent Labs  Lab 08/24/20 0332  AST 15  ALT 13  ALKPHOS 82  BILITOT 0.5  PROT 6.9  ALBUMIN 3.8   No results for input(s): LIPASE, AMYLASE in the last 168 hours. No results for input(s): AMMONIA in the last 168 hours. Coagulation Profile: No results for input(s): INR, PROTIME in the last 168 hours. Cardiac Enzymes: No results for input(s): CKTOTAL, CKMB, CKMBINDEX, TROPONINI in the last 168 hours. BNP (last 3 results) No results for input(s): PROBNP in the last 8760 hours. HbA1C: No results for input(s): HGBA1C in the last 72 hours. CBG: Recent Labs  Lab 08/26/20 0542 08/27/20 0634 08/28/20 0538 08/29/20 0443 08/30/20 0547  GLUCAP 158* 173* 121* 126* 183*   Lipid Profile: No results for input(s): CHOL, HDL, LDLCALC, TRIG, CHOLHDL, LDLDIRECT in the last 72 hours. Thyroid Function Tests: No results for input(s): TSH, T4TOTAL, FREET4, T3FREE, THYROIDAB in the last 72 hours. Anemia Panel: No results for input(s): VITAMINB12, FOLATE, FERRITIN, TIBC, IRON, RETICCTPCT in the last 72 hours. Sepsis Labs: No results for input(s): PROCALCITON, LATICACIDVEN in the last 168 hours.  No results found for this or any previous visit (from the past 240 hour(s)).       Radiology Studies: No results found.      Scheduled Meds: . amLODipine  5 mg Oral Daily  . aspirin EC  81 mg Oral Daily  . cholecalciferol  2,000 Units Oral Daily  . docusate sodium  100 mg Oral BID  . enoxaparin (LOVENOX) injection  30 mg Subcutaneous QHS  . escitalopram  5 mg Oral Daily  . feeding supplement  237 mL Oral TID BM  . multivitamin with minerals  1 tablet Oral Daily  . nicotine  21 mg Transdermal Daily  . polyethylene glycol  17 g Oral Daily  . pravastatin  20 mg Oral Daily  . QUEtiapine  25 mg Oral QHS  . senna-docusate  1 tablet Oral QHS  . sodium chloride flush  3 mL Intravenous Q12H  . cyanocobalamin  1,000 mcg Oral Daily   Continuous  Infusions:   LOS: 13 days    Time spent: [place], MD [facility]  If 7PM-7AM, please contact night-coverage  08/30/2020, 1:54 PM

## 2020-08-31 LAB — GLUCOSE, CAPILLARY: Glucose-Capillary: 138 mg/dL — ABNORMAL HIGH (ref 70–99)

## 2020-08-31 LAB — SARS CORONAVIRUS 2 (TAT 6-24 HRS): SARS Coronavirus 2: NEGATIVE

## 2020-08-31 NOTE — TOC Progression Note (Addendum)
Transition of Care [facility]) - Progression Note    Patient Details  Name: REHMAT WEATHERFORD MRN: [id number] Date of Birth: 1945/07/02  Transition of Care [facility]) CM/SW Contact  Bartholomew Crews, RN Phone Number: [phone] 08/31/2020, 1:07 PM  Clinical Narrative:     Received phone call from [place] at [place]. Patient is approved for SNF beginning today through 09/04/2020. Authorization [id number]. Clinicals to be faxed to Joneen Caraway [phone] (phone [phone]). Notified Helene Kelp at [place] - she can transition tomorrow to SNF pending covid vaccine.   1346- spoke with patient at the bedside to advise of transition tomorrow. Spoke with patient's sister, Anne Ng, to advise of authorization approved for SNF and plan for transition to [facility] tomorrow.  TOC following for transition needs.   Expected Discharge Plan: [place] Barriers to Discharge: Continued Medical Work up  Expected Discharge Plan and Services Expected Discharge Plan: [place] In-house Referral: Clinical Social Work     Living arrangements for the past 2 months: Single Family Home                                       Social Determinants of Health (SDOH) Interventions    Readmission Risk Interventions No flowsheet data found.

## 2020-08-31 NOTE — Progress Notes (Signed)
Physical Therapy Treatment Patient Details Name: Sandra Sheppard MRN: [id number] DOB: 1945/07/27 Today's Date: 08/31/2020    History of Present Illness Pt adm 3/3 with near syncopal episode and falling in to the bushes when walking her dog. +orthostatics in ED. 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 Pt with confusion.  PMH - AAA, ckd, dm, htn, pvd,paf, avr.    PT Comments    Pt received in bed. Needed min A to get into standing, but min guard with RW for ambulation. Better truncal orientation but slight lean to R. Ambulated 18f during 2 minute walk test indicating decreased endurance. Would continue to benefit from PT to address endurance and decrease risk for fall. Pt left in chair with all needs met, call bell within reach, chair alarm active and RN aware of status. Will continue to follow.  Pt appeared more confused today, stating she had pressed the button to ask to go walking down the stairs. Kept stating that she thought she was going home today and that she'd "be out of here by lunch". Another instance of confusion: when asked about the picture of a baby on her window sill, pt reported that the picture contained more than 1 person and she wanted to get out of the hospital "to get the picture blown up."     Follow Up Recommendations  SNF;Other (comment);Supervision/Assistance - 24 hour;Home health PT (Still recommend SNF, but if not possible, then home health with 24/7 supervision)     Equipment Recommendations  Rolling walker with 5" wheels;3in1 (PT)    Recommendations for Other Services       Precautions / Restrictions Precautions Precautions: Fall Precaution Comments: monitor orthostatic BPs Restrictions Weight Bearing Restrictions: No    Mobility  Bed Mobility Overal bed mobility: Needs Assistance Bed Mobility: Supine to Sit     Supine to sit: Supervision     General bed mobility comments: increased time, cueing to scoot forward,  no physical assist given    Transfers Overall transfer level: Needs assistance Equipment used: Rolling walker (2 wheeled) Transfers: Sit to/from Stand Sit to Stand: Min assist         General transfer comment: Min A to get into standing, cues for hand placement, pt using back of legs to push against bed during stand  Ambulation/Gait Ambulation/Gait assistance: Min guard Gait Distance (Feet): 150 Feet (150 total (inlcuding 70 completed during 2MWT)) Assistive device: Rolling walker (2 wheeled) Gait Pattern/deviations: Step-through pattern;Decreased stride length;Narrow base of support Gait velocity: decreased   General Gait Details: Improved midline orientation, steady with RW, min guard for safety, some cues for navigation   Stairs             Wheelchair Mobility    Modified Rankin (Stroke Patients Only)       Balance Overall balance assessment: Needs assistance Sitting-balance support: Feet supported Sitting balance-Leahy Scale: Fair     Standing balance support: Bilateral upper extremity supported;During functional activity Standing balance-Leahy Scale: Poor Standing balance comment: reliant on UE support, slight lean to right but overall lean has improved                            Cognition Arousal/Alertness: Awake/alert Behavior During Therapy: WFL for tasks assessed/performed Overall Cognitive Status: Impaired/Different from baseline Area of Impairment: Memory;Following commands;Safety/judgement;Awareness;Problem solving                   Current  Attention Level: Sustained Memory: Decreased short-term memory Following Commands: Follows one step commands with increased time;Follows multi-step commands with increased time Safety/Judgement: Decreased awareness of safety;Decreased awareness of deficits Awareness: Emergent Problem Solving: Requires verbal cues;Requires tactile cues;Slow processing;Difficulty sequencing General  Comments: Followed simple commands, but illogical responses to some questions.      Exercises      General Comments General comments (skin integrity, edema, etc.): 2 minute walk test: 75 ft      Pertinent Vitals/Pain Pain Assessment: No/denies pain    Home Living                      Prior Function            PT Goals (current goals can now be found in the care plan section) Acute Rehab PT Goals Patient Stated Goal: get home    Frequency    Min 3X/week      PT Plan Current plan remains appropriate    Co-evaluation              AM-PAC PT "6 Clicks" Mobility   Outcome Measure  Help needed turning from your back to your side while in a flat bed without using bedrails?: None Help needed moving from lying on your back to sitting on the side of a flat bed without using bedrails?: A Little Help needed moving to and from a bed to a chair (including a wheelchair)?: A Little Help needed standing up from a chair using your arms (e.g., wheelchair or bedside chair)?: A Little Help needed to walk in hospital room?: A Little Help needed climbing 3-5 steps with a railing? : A Lot 6 Click Score: 18    End of Session Equipment Utilized During Treatment: Gait belt Activity Tolerance: Patient tolerated treatment well Patient left: with call bell/phone within reach;in chair;with chair alarm set Nurse Communication: Mobility status PT Visit Diagnosis: Unsteadiness on feet (R26.81);Other abnormalities of gait and mobility (R26.89);Muscle weakness (generalized) (M62.81)     Time:  -     Charges:                        Rosita Kea, SPT

## 2020-08-31 NOTE — Progress Notes (Signed)
PROGRESS NOTE    LUIS ZEPF  [id number] DOB: 30-May-1946 DOA: 08/13/2020 PCP: Prince Solian, MD   Brief Narrative:  Sandra Sheppard a 75 year old female with past medical history significant for AAA, paroxysmal atrial fibrillation, carotid artery stenosis, aortic/mitral valve stenosis, CKD stage IV, type 2 diabetes mellitus, hyperlipidemia, essential hypertension, peripheral vascular disease, osteoporosis who presented to the ED following presyncopal episode. Patient reported being lightheaded, off balance and nearly passing out while walking her dog in her neighborhood on 08/13/2020. Patient was assisted to her feet by a neighbor and EMS was activated and subsequently taken to the ED. In the ED, vital signs were stable, on room air. Patient had mild hyponatremia with elevated creatinine levels. Chest x-ray with no active cardiopulmonary disease process. Troponins were negative. Patient was then admitted to hospital for further evaluation and treatment.  At this time, patient has been awaiting for skilled nursing facility placement. Physical therapy has reevaluated the patient and considered unstable for disposition home.Discussion the interdisciplinary meeting. Family was advised to appeal to the SNF.  Assessment & Plan:   Principal Problem:   Near syncope Active Problems:   Paroxysmal atrial fibrillation (HCC)   Dyslipidemia   Normocytic anemia   Diabetes mellitus type II, non insulin dependent (HCC)   CKD (chronic kidney disease) stage 4, GFR 15-29 ml/min (HCC)   Syncope and collapse   Protein-calorie malnutrition, severe   Near syncope likely secondary to orthostatic hypotension. CT head scan C-spine was negative for acute intracranial abnormality but for head hematoma was noted. 2D echocardiogram showed preserved LV function. Antihypertensive dose was decreased. Physical therapy, occupational therapy recommend skilled nursing facility on [facility]  hospital induced delirium. Likely has underlying baseline cognitive dysfunction with dementia. Has been started on Seroquel at nighttime with improved agitation. Appears to be delirious at times. Overall stable. MRI of the brain and CT head scan was negative except for chronic microvascular changes.  Acute kidney injury on CKD stage IV:  Creatinine 2.54 on admission. Latest creatinine of 2.39Continue to monitor BMP intermittently.   Paroxysmal atrial fibrillation Not a good candidate for anticoagulation. Currently rate controlled.   Essential hypertension On amlodipine '10mg'$  daily at home. Patient was noted to be orthostatic on admission. Amlodipine dose has been adjusted to5 mgwhich will be continued on discharge  Type 2 diabetes mellitus Overall controlled.Hemoglobin A1C on 08/16/2020 was 7.1. Continue sliding scale insulin, Accu-Cheks, diabetic diet.  Hx CVA Continue aspirin and statin  Hyperlipidemia:Continue statin  Anxiety/depression Continue Lexapro  Severe protein calorie malnutrition Present on admission. Continue nutritional supplements.   Tobacco use disorder Continue nicotine patch. Patient is a heavy smoker  Cognitive impairment CT scan MRI showed cerebral atrophy noted on imaging study with prior history of CVA and microvascular ischemic changes. Repeat MRI of the brain did not show any acute findings.  Aortic atherosclerosis Continue aspirin and statin  Debility, deconditioning, poor endurance.  At this time, patient has been seen by physical therapy and recommend skilled nursing facility patient. Awaiting for disposition at this time.   DVT prophylaxis:Lovenox Code Status: Full Family Communication: Tried calling sister, no response 3/21 Disposition Plan:   The patient is inpatient due to treatments appropriate due to intensity of illness or inability to take PO, need forskilled nursing  facilityplacement as per PT  Dispo: The patient is from: Home Anticipated d/c is [id number] per PT, pending disposition.Waiting for family to appeal as per TOC. Patient currently is medically stable to d/c.  Difficult to place patient Yes  Nutritional Assessment:  The patient's BMI is: Body mass index is 17.27 kg/m.Marland Kitchen  Seen by dietician.  I agree with the assessment and plan as outlined below:  Nutrition Status: Nutrition Problem: Severe Malnutrition Etiology: chronic illness (CKD, cardiac illness) Signs/Symptoms: severe muscle depletion,severe fat depletion Interventions: Ensure Enlive (each supplement provides 350kcal and 20 grams of protein),MVI   Consultants:   None  Procedures:  See below  Antimicrobials:  Anti-infectives (From admission, onward)   None       Subjective: Patient seen and evaluated today with no new acute complaints or concerns. No acute concerns or events noted overnight.  Objective: Vitals:   08/30/20 2048 08/30/20 2128 08/31/20 0509 08/31/20 1050  BP: (!) 141/55 (!) 153/67 (!) 142/79 (!) 159/70  Pulse: 71 69 64 (!) 51  Resp: '16 17 16 16  '$ Temp: 97.9 F (36.6 C) 98.4 F (36.9 C) 98.1 F (36.7 C) 98 F (36.7 C)  TempSrc:   Oral Oral  SpO2: 97% 94% 96% 100%  Weight:      Height:        Intake/Output Summary (Last 24 hours) at 08/31/2020 1223 Last data filed at 08/31/2020 0830 Gross per 24 hour  Intake 774 ml  Output 200 ml  Net 574 ml   Filed Weights   08/27/20 0510 08/27/20 2049 08/29/20 0620  Weight: 49.4 kg 50.5 kg 47.8 kg    Examination:  General exam: Appears calm and comfortable  Respiratory system: Clear to auscultation. Respiratory effort normal. Cardiovascular system: S1 & S2 heard, RRR.  Gastrointestinal system: Abdomen is soft Central nervous system: Alert and awake Extremities: No edema Skin: No significant lesions noted Psychiatry: Flat affect.    Data Reviewed: I  have personally reviewed following labs and imaging studies  CBC: Recent Labs  Lab 08/25/20 0711 08/28/20 0142  WBC 7.1 6.6  HGB 13.7 12.7  HCT 41.7 39.0  MCV 88.9 88.0  PLT 231 [id number]   Basic Metabolic Panel: Recent Labs  Lab 08/25/20 0711 08/27/20 0316 08/28/20 0142  NA 136  --  136  K 4.4  --  4.2  CL 103  --  105  CO2 25  --  21*  GLUCOSE 137*  --  140*  BUN 37*  --  51*  CREATININE 2.28* 2.66* 2.39*  CALCIUM 9.5  --  9.2  MG 2.9*  --  2.8*  PHOS 3.8  --  4.3   GFR: Estimated Creatinine Clearance: 15.3 mL/min (A) (by C-G formula based on SCr of 2.39 mg/dL (H)). Liver Function Tests: No results for input(s): AST, ALT, ALKPHOS, BILITOT, PROT, ALBUMIN in the last 168 hours. No results for input(s): LIPASE, AMYLASE in the last 168 hours. No results for input(s): AMMONIA in the last 168 hours. Coagulation Profile: No results for input(s): INR, PROTIME in the last 168 hours. Cardiac Enzymes: No results for input(s): CKTOTAL, CKMB, CKMBINDEX, TROPONINI in the last 168 hours. BNP (last 3 results) No results for input(s): PROBNP in the last 8760 hours. HbA1C: No results for input(s): HGBA1C in the last 72 hours. CBG: Recent Labs  Lab 08/27/20 0634 08/28/20 0538 08/29/20 0443 08/30/20 0547 08/31/20 0656  GLUCAP 173* 121* 126* 183* 138*   Lipid Profile: No results for input(s): CHOL, HDL, LDLCALC, TRIG, CHOLHDL, LDLDIRECT in the last 72 hours. Thyroid Function Tests: No results for input(s): TSH, T4TOTAL, FREET4, T3FREE, THYROIDAB in the last 72 hours. Anemia Panel: No results for input(s): VITAMINB12, FOLATE, FERRITIN, TIBC,  IRON, RETICCTPCT in the last 72 hours. Sepsis Labs: No results for input(s): PROCALCITON, LATICACIDVEN in the last 168 hours.  No results found for this or any previous visit (from the past 240 hour(s)).       Radiology Studies: No results found.      Scheduled Meds: . amLODipine  5 mg Oral Daily  . aspirin EC  81 mg Oral Daily   . cholecalciferol  2,000 Units Oral Daily  . docusate sodium  100 mg Oral BID  . enoxaparin (LOVENOX) injection  30 mg Subcutaneous QHS  . escitalopram  5 mg Oral Daily  . feeding supplement  237 mL Oral TID BM  . multivitamin with minerals  1 tablet Oral Daily  . nicotine  21 mg Transdermal Daily  . polyethylene glycol  17 g Oral Daily  . pravastatin  20 mg Oral Daily  . QUEtiapine  25 mg Oral QHS  . senna-docusate  1 tablet Oral QHS  . sodium chloride flush  3 mL Intravenous Q12H  . cyanocobalamin  1,000 mcg Oral Daily    LOS: 14 days    Time spent: 35 minutes    Pratik Darleen Crocker, DO [facility]  If 7PM-7AM, please contact night-coverage [email or web address] 08/31/2020, 12:23 PM

## 2020-09-01 DIAGNOSIS — R278 Other lack of coordination: Secondary | ICD-10-CM | POA: Diagnosis not present

## 2020-09-01 DIAGNOSIS — R488 Other symbolic dysfunctions: Secondary | ICD-10-CM | POA: Diagnosis not present

## 2020-09-01 DIAGNOSIS — Z743 Need for continuous supervision: Secondary | ICD-10-CM | POA: Diagnosis not present

## 2020-09-01 DIAGNOSIS — M25562 Pain in left knee: Secondary | ICD-10-CM | POA: Diagnosis not present

## 2020-09-01 DIAGNOSIS — G479 Sleep disorder, unspecified: Secondary | ICD-10-CM | POA: Diagnosis not present

## 2020-09-01 DIAGNOSIS — R69 Illness, unspecified: Secondary | ICD-10-CM | POA: Diagnosis not present

## 2020-09-01 DIAGNOSIS — R279 Unspecified lack of coordination: Secondary | ICD-10-CM | POA: Diagnosis not present

## 2020-09-01 DIAGNOSIS — E119 Type 2 diabetes mellitus without complications: Secondary | ICD-10-CM | POA: Diagnosis not present

## 2020-09-01 DIAGNOSIS — I679 Cerebrovascular disease, unspecified: Secondary | ICD-10-CM | POA: Diagnosis not present

## 2020-09-01 DIAGNOSIS — R079 Chest pain, unspecified: Secondary | ICD-10-CM | POA: Diagnosis not present

## 2020-09-01 DIAGNOSIS — R296 Repeated falls: Secondary | ICD-10-CM | POA: Diagnosis not present

## 2020-09-01 DIAGNOSIS — F0280 Dementia in other diseases classified elsewhere without behavioral disturbance: Secondary | ICD-10-CM | POA: Diagnosis not present

## 2020-09-01 DIAGNOSIS — R2681 Unsteadiness on feet: Secondary | ICD-10-CM | POA: Diagnosis not present

## 2020-09-01 DIAGNOSIS — I10 Essential (primary) hypertension: Secondary | ICD-10-CM | POA: Diagnosis not present

## 2020-09-01 DIAGNOSIS — R5381 Other malaise: Secondary | ICD-10-CM | POA: Diagnosis not present

## 2020-09-01 DIAGNOSIS — R2689 Other abnormalities of gait and mobility: Secondary | ICD-10-CM | POA: Diagnosis not present

## 2020-09-01 DIAGNOSIS — N184 Chronic kidney disease, stage 4 (severe): Secondary | ICD-10-CM | POA: Diagnosis not present

## 2020-09-01 DIAGNOSIS — R55 Syncope and collapse: Secondary | ICD-10-CM | POA: Diagnosis not present

## 2020-09-01 DIAGNOSIS — Z794 Long term (current) use of insulin: Secondary | ICD-10-CM | POA: Diagnosis not present

## 2020-09-01 DIAGNOSIS — Z79899 Other long term (current) drug therapy: Secondary | ICD-10-CM | POA: Diagnosis not present

## 2020-09-01 DIAGNOSIS — M6281 Muscle weakness (generalized): Secondary | ICD-10-CM | POA: Diagnosis not present

## 2020-09-01 DIAGNOSIS — W19XXXD Unspecified fall, subsequent encounter: Secondary | ICD-10-CM | POA: Diagnosis not present

## 2020-09-01 LAB — GLUCOSE, CAPILLARY: Glucose-Capillary: 128 mg/dL — ABNORMAL HIGH (ref 70–99)

## 2020-09-01 MED ORDER — QUETIAPINE FUMARATE 25 MG PO TABS: 25.0000 mg | ORAL_TABLET | Freq: Every day | ORAL | 0 refills | Status: AC

## 2020-09-01 MED ORDER — AMLODIPINE BESYLATE 5 MG PO TABS
5.0000 mg | ORAL_TABLET | Freq: Every day | ORAL | 0 refills | Status: DC
Start: 2020-09-01 — End: 2021-03-23

## 2020-09-01 MED ORDER — NICOTINE 21 MG/24HR TD PT24: 21.0000 mg | MEDICATED_PATCH | Freq: Every day | TRANSDERMAL | 0 refills | Status: DC

## 2020-09-01 MED ORDER — ENSURE ENLIVE PO LIQD: 237.0000 mL | Freq: Three times a day (TID) | ORAL | 12 refills | Status: DC

## 2020-09-01 MED ORDER — SENNOSIDES-DOCUSATE SODIUM 8.6-50 MG PO TABS: 1.0000 | ORAL_TABLET | Freq: Every day | ORAL | 0 refills | Status: AC

## 2020-09-01 NOTE — TOC Transition Note (Signed)
Transition of Care [facility]) - CM/SW Discharge Note   Patient Details  Name: Sandra Sheppard MRN: [id number] Date of Birth: June 22, 1945  [facility] CM/SW Contact:  Bartholomew Crews, RN Phone Number: [phone] 09/01/2020, 9:50 AM   Clinical Narrative:     Patient ready to transition to [facility]. Spoke with Helene Kelp. Patient to go to room 109. RN to call report to [phone].   Spoke with patient at the bedside. Left voicemail for patient's sister, Anne Ng. [facility] arranged for transport. Medical transport paperwork on the chart.   No further TOC needs identified.   Final next level of care: Skilled Nursing Facility Barriers to Discharge: No Barriers Identified   Patient Goals and CMS Choice Patient states their goals for this hospitalization and ongoing recovery are:: rehab CMS Medicare.gov Compare Post Acute Care list provided to:: Patient Represenative (must comment) Don Broach) Choice offered to / list presented to : Sibling  Discharge Placement              Patient chooses bed at:  ([facility]) Patient to be transferred to facility by: [facility] Name of family member notified: Don Broach. (sister) Patient and family notified of of transfer: 09/01/20  Discharge Plan and Services In-house Referral: Clinical Social Work              DME Arranged: N/A DME Agency: NA       HH Arranged: NA HH Agency: NA        Social Determinants of Health (SDOH) Interventions     Readmission Risk Interventions No flowsheet data found.

## 2020-09-01 NOTE — Progress Notes (Signed)
[name] to be discharged to [facility]  per MD order. Patient verbalized understanding. Report given to [place] Sales executive) at facility and answered all her questions.  Skin clean, dry and intact without evidence of skin break down, no evidence of skin tears noted. IV catheter discontinued intact. Site without signs and symptoms of complications. Dressing and pressure applied. Pt denies pain at the site currently. No complaints noted.  Patient free of lines, drains, and wounds.   Discharge packet assembled. An After Visit Summary (AVS) was printed and given to the EMS personnel. Patient escorted via stretcher and discharged to [facility] via ambulance. Report called to accepting facility; all questions and concerns addressed.   Amaryllis Dyke, RN

## 2020-09-01 NOTE — Discharge Summary (Signed)
Physician Discharge Summary  Sandra Sheppard [id number] DOB: 01/12/1946 DOA: 08/13/2020  PCP: Prince Solian, MD  Admit date: 08/13/2020  Discharge date: 09/01/2020  Admitted From:Home  Disposition:  SNF  Recommendations for Outpatient Follow-up:  1. Follow up with PCP in 1-2 weeks 2. Continue on medications as prescribed  Home Health: None  Equipment/Devices: None  Discharge Condition:Stable  CODE STATUS: Full  Diet recommendation: Heart Healthy/carb modified  Brief/Interim Summary: Sandra Sheppard a 75 year old female with past medical history significant for AAA, paroxysmal atrial fibrillation, carotid artery stenosis, aortic/mitral valve stenosis, CKD stage IV, type 2 diabetes mellitus, hyperlipidemia, essential hypertension, peripheral vascular disease, osteoporosis who presented to the ED following presyncopal episode. Patient reported being lightheaded, off balance and nearly passing out while walking her dog in her neighborhood on 08/13/2020. Patient was assisted to her feet by a neighbor and EMS was activated and subsequently taken to the ED. In the ED, vital signs were stable, on room air. Patient had mild hyponatremia with elevated creatinine levels. Chest x-ray with no active cardiopulmonary disease process. Troponins were negative. Patient was then admitted to hospital for further evaluation and treatment.  Patient has been awaiting skilled nursing facility placement and now has a bed offer and will discharge today.  She has been in stable condition otherwise during the course of this hospitalization.  No other acute events noted.  Near syncope likely secondary to orthostatic hypotension. CT head scan C-spine was negative for acute intracranial abnormality but for head hematoma was noted. 2D echocardiogram showed preserved LV function. Antihypertensive dose was decreased. Physical therapy, occupational therapy recommend skilled nursing facility on  discharge.  hospital induced delirium. Likely has underlying baseline cognitive dysfunction with dementia. Has been started on Seroquel at nighttime with improved agitation. Appears to be delirious at times. Overall stable. MRI of the brain and CT head scan was negative except for chronic microvascular changes. -Continue Seroquel as prescribed  Acute kidney injury on CKD stage IV:  Creatinine 2.54 on admission. Latest creatinine of 2.39Continue to monitor BMP intermittently in outpatient setting.   Paroxysmal atrial fibrillation Not a good candidate for anticoagulation based on recurrent falls. Currently rate controlled.   Essential hypertension-controlled On amlodipine '10mg'$  daily at home. Patient was noted to be orthostatic on admission. Amlodipine dose has been adjusted to5 mg which will be continued on discharge  Type 2 diabetes mellitus Overall controlled.Hemoglobin A1C on 08/16/2020 was 7.1.  Hx CVA Continue aspirin and statin  Hyperlipidemia:Continue statin  Anxiety/depression Continue Lexapro  Severe protein calorie malnutrition Present on admission. Continue nutritional supplements.   Tobacco use disorder Continue nicotine patch. Patient is a heavy smoker  Cognitive impairment-stable CT scan MRI showed cerebral atrophy noted on imaging study with prior history of CVA and microvascular ischemic changes. Repeat MRI of the brain did not show any acute findings.  Aortic atherosclerosis Continue aspirin and statin  Debility, deconditioning, poor endurance.  At this time, patient has been seen by physical therapy and recommend skilled nursing facility patient. Patient to SNF on discharge.  Discharge Diagnoses:  Principal Problem:   Near syncope Active Problems:   Paroxysmal atrial fibrillation (HCC)   Dyslipidemia   Normocytic anemia   Diabetes mellitus type II, non insulin dependent (HCC)   CKD (chronic kidney disease) stage 4,  GFR 15-29 ml/min (HCC)   Syncope and collapse   Protein-calorie malnutrition, severe    Discharge Instructions  Discharge Instructions    Diet - low sodium heart healthy   Complete by: As  directed    Increase activity slowly   Complete by: As directed      Allergies as of 09/01/2020      Reactions   Ativan [lorazepam] Other (See Comments)   Confusion   Codeine    Rash, Nausea and Vomiting    Sulfonamide Derivatives    Penicillins Rash   Has patient had a PCN reaction causing immediate rash, facial/tongue/throat swelling, SOB or lightheadedness with hypotension: YES Has patient had a PCN reaction causing severe rash involving mucus membranes or skin necrosis:NO Has patient had a PCN reaction that required hospitalization NO Has patient had a PCN reaction occurring within the last 10 years: NO If all of the above answers are "NO", then may proceed with Cephalosporin use.      Medication List    TAKE these medications   amLODipine 5 MG tablet Commonly known as: NORVASC Take 1 tablet (5 mg total) by mouth daily. What changed:   medication strength  how much to take   aspirin 81 MG tablet Take 81 mg by mouth daily.   cyanocobalamin 1000 MCG tablet Take 1 tablet (1,000 mcg total) by mouth daily.   docusate sodium 100 MG capsule Commonly known as: COLACE Take 1 capsule (100 mg total) by mouth 2 (two) times daily.   escitalopram 5 MG tablet Commonly known as: LEXAPRO Take 5 mg by mouth daily.   feeding supplement Liqd Take 237 mLs by mouth 3 (three) times daily between meals.   loratadine 10 MG tablet Commonly known as: CLARITIN Take 10 mg by mouth daily as needed for allergies.   multivitamin capsule Take 1 capsule by mouth daily.   nicotine 21 mg/24hr patch Commonly known as: NICODERM CQ - dosed in mg/24 hours Place 1 patch (21 mg total) onto the skin daily.   pravastatin 20 MG tablet Commonly known as: PRAVACHOL Take 20 mg by mouth daily.    QUEtiapine 25 MG tablet Commonly known as: SEROQUEL Take 1 tablet (25 mg total) by mouth at bedtime.   senna-docusate 8.6-50 MG tablet Commonly known as: Senokot-S Take 1 tablet by mouth at bedtime.   Vitamin D-3 25 MCG (1000 UT) Caps Take 2,000 Units by mouth daily.   zoledronic acid 5 MG/100ML Soln injection Commonly known as: RECLAST Inject 5 mg into the vein See admin instructions. Once a year       Contact information for follow-up providers    Avva, Ravisankar, MD Follow up in 1 week(s).   Specialty: Internal Medicine Contact information: [place] [address] [phone]        Minus Breeding, MD .   Specialty: Cardiology Contact information: [address] [place] [address] [phone]        [facility], [place] Follow up.   Specialty: [place] Why: the office will call to arrange services Contact information: [place] [address] [phone]            Contact information for after-discharge care    Destination    [facility] .   Service: Skilled Nursing Contact information: [place] [address] [phone]                 Allergies  Allergen Reactions  . Ativan [Lorazepam] Other (See Comments)    Confusion  . Codeine     Rash, Nausea and Vomiting   . Sulfonamide Derivatives   . Penicillins Rash  Has patient had a PCN reaction causing immediate rash, facial/tongue/throat swelling, SOB or lightheadedness with hypotension: YES Has patient had a PCN reaction causing severe rash involving mucus membranes or skin necrosis:NO Has patient had a PCN reaction that required hospitalization NO Has patient had a PCN reaction occurring within the last 10 years: NO If all of the above answers are "NO", then may proceed with Cephalosporin use.    Consultations:  None   Procedures/Studies: CT Head Wo  Contrast  Result Date: 08/13/2020 CLINICAL DATA:  Mental status change EXAM: CT HEAD WITHOUT CONTRAST TECHNIQUE: Contiguous axial images were obtained from the base of the skull through the vertex without intravenous contrast. COMPARISON:  October 09, 2019 FINDINGS: Brain: No evidence of acute territorial infarction, hemorrhage, hydrocephalus,extra-axial collection or mass lesion/mass effect. There is dilatation the ventricles and sulci consistent with age-related atrophy. Low-attenuation changes in the deep white matter consistent with small vessel ischemia. Vascular: No hyperdense vessel or unexpected calcification. Skull: The skull is intact. No fracture or focal lesion identified. Sinuses/Orbits: The visualized paranasal sinuses and mastoid air cells are clear. The orbits and globes intact. Other: None IMPRESSION: No acute intracranial abnormality. Findings consistent with age related atrophy and chronic small vessel ischemia Electronically Signed   By: Prudencio Pair M.D.   On: 08/13/2020 18:23   MR BRAIN WO CONTRAST  Result Date: 08/21/2020 CLINICAL DATA:  Dizziness EXAM: MRI HEAD WITHOUT CONTRAST TECHNIQUE: Multiplanar, multiecho pulse sequences of the brain and surrounding structures were obtained without intravenous contrast. COMPARISON:  08/13/2020 FINDINGS: Brain: No acute infarct, mass effect or extra-axial collection. 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 Confluent hyperintense T2-weighted white matter signal. Generalized volume loss without a clear lobar predilection. The midline structures are normal. Old small vessel infarct of the right basal ganglia and left cerebellum. Vascular: Major flow voids are preserved. Skull and upper cervical spine: Normal calvarium and skull base. Visualized upper cervical spine and soft tissues are normal. Sinuses/Orbits:No paranasal sinus fluid levels or advanced mucosal thickening. No mastoid or middle ear effusion. Normal orbits. IMPRESSION: 1. No acute  intracranial abnormality. 2. Confluent hyperintense T2-weighted white matter signal, consistent with chronic microvascular ischemia. 3. Old small vessel infarcts of the right basal ganglia and left cerebellum. Electronically Signed   By: Ulyses Jarred M.D.   On: 08/21/2020 02:18   MR BRAIN WO CONTRAST  Result Date: 08/13/2020 CLINICAL DATA:  Initial evaluation for neuro deficit, stroke suspected. EXAM: MRI HEAD WITHOUT CONTRAST TECHNIQUE: Multiplanar, multiecho pulse sequences of the brain and surrounding structures were obtained without intravenous contrast. COMPARISON:  Prior CT from earlier the same day. FINDINGS: Brain: Examination severely limited due to extensive motion artifact. Generalized age-related cerebral atrophy. Patchy and confluent T2/FLAIR hyperintensity within the periventricular and deep white matter both cerebral hemispheres, most consistent with chronic small vessel ischemic disease, moderate to advanced in nature. Remote lacunar infarct present at the right basal ganglia. Probable additional small remote left cerebellar infarct. No abnormal foci of restricted diffusion to suggest acute or subacute ischemia. Gray-white matter differentiation maintained. No encephalomalacia to suggest chronic cortical infarction. No visible acute intracranial hemorrhage. Possible small foci of chronic hemosiderin deposition at the left cerebellum and right temporal occipital region. No acute hemorrhage seen on prior CT. No mass lesion, midline shift or mass effect. No hydrocephalus or extra-axial fluid collection. Pituitary gland suprasellar region within normal limits. Midline structures intact. Vascular: Major intracranial vascular flow voids are grossly preserved at the skull base. Skull and upper cervical spine: Craniocervical  junction within normal limits. Bone marrow signal intensity normal. No scalp soft tissue abnormality. Sinuses/Orbits: Globes and orbital soft tissues grossly within normal limits.  Paranasal sinuses are largely clear. No appreciable mastoid effusion. Other: None. IMPRESSION: 1. Technically limited exam due to extensive motion artifact. 2. No acute intracranial abnormality. 3. Age-related cerebral atrophy with moderate to advanced chronic microvascular ischemic disease, with remote lacunar infarcts at the right basal ganglia and left cerebellum. Electronically Signed   By: Jeannine Boga M.D.   On: 08/13/2020 22:45   DG Chest Port 1 View  Result Date: 08/13/2020 CLINICAL DATA:  Syncope. EXAM: PORTABLE CHEST 1 VIEW COMPARISON:  July 04, 2016. FINDINGS: The heart size and mediastinal contours are within normal limits. Both lungs are clear. No pneumothorax or pleural effusion is noted. The visualized skeletal structures are unremarkable. IMPRESSION: No active disease. Aortic Atherosclerosis (ICD10-I70.0). Electronically Signed   By: Marijo Conception M.D.   On: 08/13/2020 16:51   ECHOCARDIOGRAM COMPLETE  Result Date: 08/14/2020    ECHOCARDIOGRAM REPORT   Patient Name:   JODDIE DIGIORGIO Date of Exam: 08/14/2020 Medical Rec #:  [id number]      Height:       65.5 in Accession #:    [id number]     Weight:       116.0 lb Date of Birth:  04-26-1946       BSA:          1.578 m Patient Age:    49 years       BP:           148/82 mmHg Patient Gender: F              HR:           65 bpm. Exam Location:  Inpatient Procedure: 2D Echo, 3D Echo, Cardiac Doppler, Color Doppler and Strain Analysis Indications:    Syncope  History:        Patient has prior history of Echocardiogram examinations, most                 recent 05/26/2017. H/O Bio AVR and Aortic Valve Disease,                 Arrythmias:Atrial Fibrillation; Risk Factors:Dyslipidemia.                 Aortic Valve: 19 mm Edwards MagnaEase bioprosthetic valve is                 present in the aortic position. Procedure Date: 08/14/2008.  Sonographer:    Luisa Hart RDCS Referring Phys: [id number] [place]  1. Left ventricular  ejection fraction, by estimation, is 65 to 70%. The left ventricle has normal function. The left ventricle has no regional wall motion abnormalities. Left ventricular diastolic parameters are consistent with Grade I diastolic dysfunction (impaired relaxation).  2. Right ventricular systolic function is mildly reduced. The right ventricular size is normal. There is normal pulmonary artery systolic pressure.  3. The mitral valve is abnormal. No evidence of mitral valve regurgitation.  4. The aortic valve has been repaired/replaced. Aortic valve regurgitation is not visualized. There is a 19 mm Edwards MagnaEase bioprosthetic valve present in the aortic position. Procedure Date: 08/14/2008. Aortic valve area, by VTI measures 0.94 cm. Aortic valve mean gradient measures 16.5 mmHg.  5. The inferior vena cava is normal in size with greater than 50% respiratory variability, suggesting right atrial pressure of 3 mmHg. Comparison(s): Changes  from prior study are noted. 05/26/2017: LVEF 60-65%, mean AOV gradient 17 mmHg. FINDINGS  Left Ventricle: Left ventricular ejection fraction, by estimation, is 65 to 70%. The left ventricle has normal function. The left ventricle has no regional wall motion abnormalities. The left ventricular internal cavity size was small. There is no left ventricular hypertrophy. Left ventricular diastolic parameters are consistent with Grade I diastolic dysfunction (impaired relaxation). Indeterminate filling pressures. Right Ventricle: The right ventricular size is normal. No increase in right ventricular wall thickness. Right ventricular systolic function is mildly reduced. There is normal pulmonary artery systolic pressure. The tricuspid regurgitant velocity is 2.14 m/s, and with an assumed right atrial pressure of 3 mmHg, the estimated right ventricular systolic pressure is [id number] mmHg. Left Atrium: Left atrial size was normal in size. Right Atrium: Right atrial size was normal in size. Pericardium:  There is no evidence of pericardial effusion. Mitral Valve: The mitral valve is abnormal. Mild to moderate mitral annular calcification. No evidence of mitral valve regurgitation. Tricuspid Valve: The tricuspid valve is grossly normal. Tricuspid valve regurgitation is mild. Aortic Valve: The aortic valve has been repaired/replaced. Aortic valve regurgitation is not visualized. Aortic valve mean gradient measures 16.5 mmHg. Aortic valve peak gradient measures 24.5 mmHg. Aortic valve area, by VTI measures 0.94 cm. There is a  19 mm Edwards MagnaEase bioprosthetic valve present in the aortic position. Procedure Date: 08/14/2008. Pulmonic Valve: The pulmonic valve was normal in structure. Pulmonic valve regurgitation is not visualized. Aorta: The aortic root and ascending aorta are structurally normal, with no evidence of dilitation. Venous: The inferior vena cava is normal in size with greater than 50% respiratory variability, suggesting right atrial pressure of 3 mmHg. IAS/Shunts: The interatrial septum is aneurysmal. No atrial level shunt detected by color flow Doppler.  LEFT VENTRICLE PLAX 2D LVIDd:         4.00 cm  Diastology LVIDs:         1.80 cm  LV e' medial:    6.08 cm/s LV PW:         1.00 cm  LV E/e' medial:  12.0 LV IVS:        1.10 cm  LV e' lateral:   8.27 cm/s LVOT diam:     2.10 cm  LV E/e' lateral: 8.8 LV SV:         61 LV SV Index:   38 LVOT Area:     3.46 cm  RIGHT VENTRICLE RV S prime:     8.68 cm/s TAPSE (M-mode): 1.1 cm LEFT ATRIUM           Index       RIGHT ATRIUM           Index LA diam:      3.20 cm 2.03 cm/m  RA Area:     12.60 cm LA Vol (A2C): 10.6 ml 6.72 ml/m  RA Volume:   31.70 ml  20.09 ml/m LA Vol (A4C): 42.0 ml 26.61 ml/m  AORTIC VALVE                    PULMONIC VALVE AV Area (Vmax):    0.92 cm     PV Vmax:       0.61 m/s AV Area (Vmean):   0.85 cm     PV Peak grad:  1.5 mmHg AV Area (VTI):     0.94 cm AV Vmax:           247.50 cm/s AV Vmean:  192.000 cm/s AV VTI:             0.644 m AV Peak Grad:      24.5 mmHg AV Mean Grad:      16.5 mmHg LVOT Vmax:         65.80 cm/s LVOT Vmean:        46.900 cm/s LVOT VTI:          0.175 m LVOT/AV VTI ratio: 0.27  AORTA Ao Root diam: 2.80 cm Ao Asc diam:  3.05 cm MITRAL VALVE               TRICUSPID VALVE MV Area (PHT): 4.15 cm    TR Peak grad:   18.3 mmHg MV Decel Time: 183 msec    TR Vmax:        214.00 cm/s MV E velocity: 72.80 cm/s MV A velocity: 82.00 cm/s  SHUNTS MV E/A ratio:  0.89        Systemic VTI:  0.18 m                            Systemic Diam: 2.10 cm Lyman Bishop MD Electronically signed by Lyman Bishop MD Signature Date/Time: 08/14/2020/11:31:48 AM    Final       Discharge Exam: Vitals:   08/31/20 2029 09/01/20 0514  BP: (!) 150/58 (!) 171/76  Pulse: (!) 55 65  Resp: 19 18  Temp: (!) 97.3 F (36.3 C) 97.6 F (36.4 C)  SpO2: 98% 97%   Vitals:   08/31/20 1050 08/31/20 1800 08/31/20 2029 09/01/20 0514  BP: (!) 159/70 (!) 157/68 (!) 150/58 (!) 171/76  Pulse: (!) 51 (!) 52 (!) 55 65  Resp: '16 16 19 18  '$ Temp: 98 F (36.7 C) 98 F (36.7 C) (!) 97.3 F (36.3 C) 97.6 F (36.4 C)  TempSrc: Oral     SpO2: 100% 100% 98% 97%  Weight:      Height:        General: Pt is alert, awake, not in acute distress Cardiovascular: RRR, S1/S2 +, no rubs, no gallops Respiratory: CTA bilaterally, no wheezing, no rhonchi Abdominal: Soft, NT, ND, bowel sounds + Extremities: no edema, no cyanosis    The results of significant diagnostics from this hospitalization (including imaging, microbiology, ancillary and laboratory) are listed below for reference.     Microbiology: Recent Results (from the past 240 hour(s))  SARS CORONAVIRUS 2 (TAT 6-24 HRS) Nasopharyngeal Nasopharyngeal Swab     Status: None   Collection Time: 08/31/20  1:13 PM   Specimen: Nasopharyngeal Swab  Result Value Ref Range Status   SARS Coronavirus 2 NEGATIVE NEGATIVE Final    Comment: (NOTE) SARS-CoV-2 target nucleic acids are NOT  DETECTED.  The SARS-CoV-2 RNA is generally detectable in upper and lower respiratory specimens during the acute phase of infection. Negative results do not preclude SARS-CoV-2 infection, do not rule out co-infections with other pathogens, and should not be used as the sole basis for treatment or other patient management decisions. Negative results must be combined with clinical observations, patient history, and epidemiological information. The expected result is Negative.  Fact Sheet for Patients: SugarRoll.be  Fact Sheet for Healthcare Providers: https://www.woods-mathews.com/  This test is not yet approved or cleared by the [place] FDA and  has been authorized for detection and/or diagnosis of SARS-CoV-2 by FDA under an Emergency Use Authorization (EUA). This EUA will remain  in effect (meaning this test can  be used) for the duration of the COVID-19 declaration under Se ction 564(b)(1) of the Act, 21 U.S.C. section 360bbb-3(b)(1), unless the authorization is terminated or revoked sooner.  Performed at [facility], [place] [address], [place], [place] [address]      Labs: BNP (last 3 results) No results for input(s): BNP in the last 8760 hours. Basic Metabolic Panel: Recent Labs  Lab 08/27/20 0316 08/28/20 0142  NA  --  136  K  --  4.2  CL  --  105  CO2  --  21*  GLUCOSE  --  140*  BUN  --  51*  CREATININE 2.66* 2.39*  CALCIUM  --  9.2  MG  --  2.8*  PHOS  --  4.3   Liver Function Tests: No results for input(s): AST, ALT, ALKPHOS, BILITOT, PROT, ALBUMIN in the last 168 hours. No results for input(s): LIPASE, AMYLASE in the last 168 hours. No results for input(s): AMMONIA in the last 168 hours. CBC: Recent Labs  Lab 08/28/20 0142  WBC 6.6  HGB 12.7  HCT 39.0  MCV 88.0  PLT 210   Cardiac Enzymes: No results for input(s): CKTOTAL, CKMB, CKMBINDEX, TROPONINI in the last 168 hours. BNP: Invalid input(s):  POCBNP CBG: Recent Labs  Lab 08/28/20 0538 08/29/20 0443 08/30/20 0547 08/31/20 0656 09/01/20 0633  GLUCAP 121* 126* 183* 138* 128*   D-Dimer No results for input(s): DDIMER in the last 72 hours. Hgb A1c No results for input(s): HGBA1C in the last 72 hours. Lipid Profile No results for input(s): CHOL, HDL, LDLCALC, TRIG, CHOLHDL, LDLDIRECT in the last 72 hours. Thyroid function studies No results for input(s): TSH, T4TOTAL, T3FREE, THYROIDAB in the last 72 hours.  Invalid input(s): FREET3 Anemia work up No results for input(s): VITAMINB12, FOLATE, FERRITIN, TIBC, IRON, RETICCTPCT in the last 72 hours. Urinalysis    Component Value Date/Time   COLORURINE YELLOW 08/13/2020 1722   APPEARANCEUR CLEAR 08/13/2020 1722   LABSPEC 1.009 08/13/2020 1722   PHURINE 5.0 08/13/2020 1722   GLUCOSEU NEGATIVE 08/13/2020 1722   HGBUR NEGATIVE 08/13/2020 1722   BILIRUBINUR NEGATIVE 08/13/2020 1722   KETONESUR NEGATIVE 08/13/2020 1722   PROTEINUR NEGATIVE 08/13/2020 1722   UROBILINOGEN 0.2 01/14/2010 1308   NITRITE NEGATIVE 08/13/2020 1722   LEUKOCYTESUR NEGATIVE 08/13/2020 1722   Sepsis Labs Invalid input(s): PROCALCITONIN,  WBC,  LACTICIDVEN Microbiology Recent Results (from the past 240 hour(s))  SARS CORONAVIRUS 2 (TAT 6-24 HRS) Nasopharyngeal Nasopharyngeal Swab     Status: None   Collection Time: 08/31/20  1:13 PM   Specimen: Nasopharyngeal Swab  Result Value Ref Range Status   SARS Coronavirus 2 NEGATIVE NEGATIVE Final    Comment: (NOTE) SARS-CoV-2 target nucleic acids are NOT DETECTED.  The SARS-CoV-2 RNA is generally detectable in upper and lower respiratory specimens during the acute phase of infection. Negative results do not preclude SARS-CoV-2 infection, do not rule out co-infections with other pathogens, and should not be used as the sole basis for treatment or other patient management decisions. Negative results must be combined with clinical observations, patient  history, and epidemiological information. The expected result is Negative.  Fact Sheet for Patients: SugarRoll.be  Fact Sheet for Healthcare Providers: https://www.woods-mathews.com/  This test is not yet approved or cleared by the [place] FDA and  has been authorized for detection and/or diagnosis of SARS-CoV-2 by FDA under an Emergency Use Authorization (EUA). This EUA will remain  in effect (meaning this test can be used) for  the duration of the COVID-19 declaration under Se ction 564(b)(1) of the Act, 21 U.S.C. section 360bbb-3(b)(1), unless the authorization is terminated or revoked sooner.  Performed at [facility], [place] [address], [place], [place] [address]      Time coordinating discharge: 35 minutes  SIGNED:   Rodena Goldmann, DO [facility] 09/01/2020, 9:04 AM  If 7PM-7AM, please contact night-coverage [email or web address]

## 2020-09-08 DIAGNOSIS — R55 Syncope and collapse: Secondary | ICD-10-CM | POA: Diagnosis not present

## 2020-09-08 DIAGNOSIS — R079 Chest pain, unspecified: Secondary | ICD-10-CM | POA: Diagnosis not present

## 2020-09-11 DIAGNOSIS — G479 Sleep disorder, unspecified: Secondary | ICD-10-CM | POA: Diagnosis not present

## 2020-09-11 DIAGNOSIS — Z794 Long term (current) use of insulin: Secondary | ICD-10-CM | POA: Diagnosis not present

## 2020-09-11 DIAGNOSIS — I10 Essential (primary) hypertension: Secondary | ICD-10-CM | POA: Diagnosis not present

## 2020-09-11 DIAGNOSIS — E119 Type 2 diabetes mellitus without complications: Secondary | ICD-10-CM | POA: Diagnosis not present

## 2020-09-11 DIAGNOSIS — R69 Illness, unspecified: Secondary | ICD-10-CM | POA: Diagnosis not present

## 2020-09-13 DIAGNOSIS — M25562 Pain in left knee: Secondary | ICD-10-CM | POA: Diagnosis not present

## 2020-09-14 DIAGNOSIS — R69 Illness, unspecified: Secondary | ICD-10-CM | POA: Diagnosis not present

## 2020-09-14 DIAGNOSIS — W19XXXD Unspecified fall, subsequent encounter: Secondary | ICD-10-CM | POA: Diagnosis not present

## 2020-09-14 DIAGNOSIS — F0280 Dementia in other diseases classified elsewhere without behavioral disturbance: Secondary | ICD-10-CM | POA: Diagnosis not present

## 2020-09-14 DIAGNOSIS — Z79899 Other long term (current) drug therapy: Secondary | ICD-10-CM | POA: Diagnosis not present

## 2020-10-05 DIAGNOSIS — R69 Illness, unspecified: Secondary | ICD-10-CM | POA: Diagnosis not present

## 2020-10-05 DIAGNOSIS — E119 Type 2 diabetes mellitus without complications: Secondary | ICD-10-CM | POA: Diagnosis not present

## 2020-10-05 DIAGNOSIS — I10 Essential (primary) hypertension: Secondary | ICD-10-CM | POA: Diagnosis not present

## 2020-10-05 DIAGNOSIS — W19XXXD Unspecified fall, subsequent encounter: Secondary | ICD-10-CM | POA: Diagnosis not present

## 2020-10-05 DIAGNOSIS — Z79899 Other long term (current) drug therapy: Secondary | ICD-10-CM | POA: Diagnosis not present

## 2020-10-05 DIAGNOSIS — Z794 Long term (current) use of insulin: Secondary | ICD-10-CM | POA: Diagnosis not present

## 2020-10-05 DIAGNOSIS — F32A Depression, unspecified: Secondary | ICD-10-CM | POA: Diagnosis not present

## 2020-10-05 DIAGNOSIS — F0280 Dementia in other diseases classified elsewhere without behavioral disturbance: Secondary | ICD-10-CM | POA: Diagnosis not present

## 2020-10-14 DIAGNOSIS — Z043 Encounter for examination and observation following other accident: Secondary | ICD-10-CM | POA: Diagnosis not present

## 2020-10-14 DIAGNOSIS — M5136 Other intervertebral disc degeneration, lumbar region: Secondary | ICD-10-CM | POA: Diagnosis not present

## 2020-10-16 DIAGNOSIS — W19XXXD Unspecified fall, subsequent encounter: Secondary | ICD-10-CM | POA: Diagnosis not present

## 2020-10-16 DIAGNOSIS — G479 Sleep disorder, unspecified: Secondary | ICD-10-CM | POA: Diagnosis not present

## 2020-10-16 DIAGNOSIS — R69 Illness, unspecified: Secondary | ICD-10-CM | POA: Diagnosis not present

## 2020-10-19 DIAGNOSIS — R488 Other symbolic dysfunctions: Secondary | ICD-10-CM | POA: Diagnosis not present

## 2020-10-19 DIAGNOSIS — R55 Syncope and collapse: Secondary | ICD-10-CM | POA: Diagnosis not present

## 2020-10-19 DIAGNOSIS — E119 Type 2 diabetes mellitus without complications: Secondary | ICD-10-CM | POA: Diagnosis not present

## 2020-10-19 DIAGNOSIS — Z79899 Other long term (current) drug therapy: Secondary | ICD-10-CM | POA: Diagnosis not present

## 2020-10-19 DIAGNOSIS — M19042 Primary osteoarthritis, left hand: Secondary | ICD-10-CM | POA: Diagnosis not present

## 2020-10-19 DIAGNOSIS — M6281 Muscle weakness (generalized): Secondary | ICD-10-CM | POA: Diagnosis not present

## 2020-10-19 DIAGNOSIS — R296 Repeated falls: Secondary | ICD-10-CM | POA: Diagnosis not present

## 2020-10-19 DIAGNOSIS — N184 Chronic kidney disease, stage 4 (severe): Secondary | ICD-10-CM | POA: Diagnosis not present

## 2020-10-19 DIAGNOSIS — F0280 Dementia in other diseases classified elsewhere without behavioral disturbance: Secondary | ICD-10-CM | POA: Diagnosis not present

## 2020-10-19 DIAGNOSIS — M1712 Unilateral primary osteoarthritis, left knee: Secondary | ICD-10-CM | POA: Diagnosis not present

## 2020-10-19 DIAGNOSIS — R2689 Other abnormalities of gait and mobility: Secondary | ICD-10-CM | POA: Diagnosis not present

## 2020-10-19 DIAGNOSIS — R69 Illness, unspecified: Secondary | ICD-10-CM | POA: Diagnosis not present

## 2020-10-19 DIAGNOSIS — R278 Other lack of coordination: Secondary | ICD-10-CM | POA: Diagnosis not present

## 2020-10-19 DIAGNOSIS — W19XXXD Unspecified fall, subsequent encounter: Secondary | ICD-10-CM | POA: Diagnosis not present

## 2020-10-19 DIAGNOSIS — R2681 Unsteadiness on feet: Secondary | ICD-10-CM | POA: Diagnosis not present

## 2020-10-19 DIAGNOSIS — I679 Cerebrovascular disease, unspecified: Secondary | ICD-10-CM | POA: Diagnosis not present

## 2020-10-20 DIAGNOSIS — R2689 Other abnormalities of gait and mobility: Secondary | ICD-10-CM | POA: Diagnosis not present

## 2020-10-20 DIAGNOSIS — R278 Other lack of coordination: Secondary | ICD-10-CM | POA: Diagnosis not present

## 2020-10-20 DIAGNOSIS — I679 Cerebrovascular disease, unspecified: Secondary | ICD-10-CM | POA: Diagnosis not present

## 2020-10-20 DIAGNOSIS — R296 Repeated falls: Secondary | ICD-10-CM | POA: Diagnosis not present

## 2020-10-20 DIAGNOSIS — R2681 Unsteadiness on feet: Secondary | ICD-10-CM | POA: Diagnosis not present

## 2020-10-20 DIAGNOSIS — R488 Other symbolic dysfunctions: Secondary | ICD-10-CM | POA: Diagnosis not present

## 2020-10-20 DIAGNOSIS — R55 Syncope and collapse: Secondary | ICD-10-CM | POA: Diagnosis not present

## 2020-10-20 DIAGNOSIS — E119 Type 2 diabetes mellitus without complications: Secondary | ICD-10-CM | POA: Diagnosis not present

## 2020-10-20 DIAGNOSIS — N184 Chronic kidney disease, stage 4 (severe): Secondary | ICD-10-CM | POA: Diagnosis not present

## 2020-10-20 DIAGNOSIS — M6281 Muscle weakness (generalized): Secondary | ICD-10-CM | POA: Diagnosis not present

## 2020-10-21 DIAGNOSIS — I679 Cerebrovascular disease, unspecified: Secondary | ICD-10-CM | POA: Diagnosis not present

## 2020-10-21 DIAGNOSIS — R488 Other symbolic dysfunctions: Secondary | ICD-10-CM | POA: Diagnosis not present

## 2020-10-21 DIAGNOSIS — R2681 Unsteadiness on feet: Secondary | ICD-10-CM | POA: Diagnosis not present

## 2020-10-21 DIAGNOSIS — E119 Type 2 diabetes mellitus without complications: Secondary | ICD-10-CM | POA: Diagnosis not present

## 2020-10-21 DIAGNOSIS — E118 Type 2 diabetes mellitus with unspecified complications: Secondary | ICD-10-CM | POA: Diagnosis not present

## 2020-10-21 DIAGNOSIS — R296 Repeated falls: Secondary | ICD-10-CM | POA: Diagnosis not present

## 2020-10-21 DIAGNOSIS — R55 Syncope and collapse: Secondary | ICD-10-CM | POA: Diagnosis not present

## 2020-10-21 DIAGNOSIS — N184 Chronic kidney disease, stage 4 (severe): Secondary | ICD-10-CM | POA: Diagnosis not present

## 2020-10-21 DIAGNOSIS — R278 Other lack of coordination: Secondary | ICD-10-CM | POA: Diagnosis not present

## 2020-10-21 DIAGNOSIS — M6281 Muscle weakness (generalized): Secondary | ICD-10-CM | POA: Diagnosis not present

## 2020-10-21 DIAGNOSIS — R2689 Other abnormalities of gait and mobility: Secondary | ICD-10-CM | POA: Diagnosis not present

## 2020-10-23 DIAGNOSIS — N184 Chronic kidney disease, stage 4 (severe): Secondary | ICD-10-CM | POA: Diagnosis not present

## 2020-10-23 DIAGNOSIS — R2681 Unsteadiness on feet: Secondary | ICD-10-CM | POA: Diagnosis not present

## 2020-10-23 DIAGNOSIS — E119 Type 2 diabetes mellitus without complications: Secondary | ICD-10-CM | POA: Diagnosis not present

## 2020-10-23 DIAGNOSIS — R55 Syncope and collapse: Secondary | ICD-10-CM | POA: Diagnosis not present

## 2020-10-23 DIAGNOSIS — R278 Other lack of coordination: Secondary | ICD-10-CM | POA: Diagnosis not present

## 2020-10-23 DIAGNOSIS — I679 Cerebrovascular disease, unspecified: Secondary | ICD-10-CM | POA: Diagnosis not present

## 2020-10-23 DIAGNOSIS — R488 Other symbolic dysfunctions: Secondary | ICD-10-CM | POA: Diagnosis not present

## 2020-10-23 DIAGNOSIS — R296 Repeated falls: Secondary | ICD-10-CM | POA: Diagnosis not present

## 2020-10-23 DIAGNOSIS — R2689 Other abnormalities of gait and mobility: Secondary | ICD-10-CM | POA: Diagnosis not present

## 2020-10-23 DIAGNOSIS — M6281 Muscle weakness (generalized): Secondary | ICD-10-CM | POA: Diagnosis not present

## 2020-10-24 DIAGNOSIS — N184 Chronic kidney disease, stage 4 (severe): Secondary | ICD-10-CM | POA: Diagnosis not present

## 2020-10-24 DIAGNOSIS — M6281 Muscle weakness (generalized): Secondary | ICD-10-CM | POA: Diagnosis not present

## 2020-10-24 DIAGNOSIS — R278 Other lack of coordination: Secondary | ICD-10-CM | POA: Diagnosis not present

## 2020-10-24 DIAGNOSIS — R296 Repeated falls: Secondary | ICD-10-CM | POA: Diagnosis not present

## 2020-10-24 DIAGNOSIS — I679 Cerebrovascular disease, unspecified: Secondary | ICD-10-CM | POA: Diagnosis not present

## 2020-10-24 DIAGNOSIS — R488 Other symbolic dysfunctions: Secondary | ICD-10-CM | POA: Diagnosis not present

## 2020-10-24 DIAGNOSIS — R55 Syncope and collapse: Secondary | ICD-10-CM | POA: Diagnosis not present

## 2020-10-24 DIAGNOSIS — R2689 Other abnormalities of gait and mobility: Secondary | ICD-10-CM | POA: Diagnosis not present

## 2020-10-24 DIAGNOSIS — E119 Type 2 diabetes mellitus without complications: Secondary | ICD-10-CM | POA: Diagnosis not present

## 2020-10-24 DIAGNOSIS — R2681 Unsteadiness on feet: Secondary | ICD-10-CM | POA: Diagnosis not present

## 2020-10-26 DIAGNOSIS — R2681 Unsteadiness on feet: Secondary | ICD-10-CM | POA: Diagnosis not present

## 2020-10-26 DIAGNOSIS — I679 Cerebrovascular disease, unspecified: Secondary | ICD-10-CM | POA: Diagnosis not present

## 2020-10-26 DIAGNOSIS — N184 Chronic kidney disease, stage 4 (severe): Secondary | ICD-10-CM | POA: Diagnosis not present

## 2020-10-26 DIAGNOSIS — R69 Illness, unspecified: Secondary | ICD-10-CM | POA: Diagnosis not present

## 2020-10-26 DIAGNOSIS — E119 Type 2 diabetes mellitus without complications: Secondary | ICD-10-CM | POA: Diagnosis not present

## 2020-10-26 DIAGNOSIS — R55 Syncope and collapse: Secondary | ICD-10-CM | POA: Diagnosis not present

## 2020-10-26 DIAGNOSIS — F0280 Dementia in other diseases classified elsewhere without behavioral disturbance: Secondary | ICD-10-CM | POA: Diagnosis not present

## 2020-10-26 DIAGNOSIS — M6281 Muscle weakness (generalized): Secondary | ICD-10-CM | POA: Diagnosis not present

## 2020-10-26 DIAGNOSIS — R296 Repeated falls: Secondary | ICD-10-CM | POA: Diagnosis not present

## 2020-10-26 DIAGNOSIS — I10 Essential (primary) hypertension: Secondary | ICD-10-CM | POA: Diagnosis not present

## 2020-10-26 DIAGNOSIS — R2689 Other abnormalities of gait and mobility: Secondary | ICD-10-CM | POA: Diagnosis not present

## 2020-10-26 DIAGNOSIS — F32A Depression, unspecified: Secondary | ICD-10-CM | POA: Diagnosis not present

## 2020-10-26 DIAGNOSIS — R278 Other lack of coordination: Secondary | ICD-10-CM | POA: Diagnosis not present

## 2020-10-26 DIAGNOSIS — R488 Other symbolic dysfunctions: Secondary | ICD-10-CM | POA: Diagnosis not present

## 2020-10-27 DIAGNOSIS — R2689 Other abnormalities of gait and mobility: Secondary | ICD-10-CM | POA: Diagnosis not present

## 2020-10-27 DIAGNOSIS — I679 Cerebrovascular disease, unspecified: Secondary | ICD-10-CM | POA: Diagnosis not present

## 2020-10-27 DIAGNOSIS — R2681 Unsteadiness on feet: Secondary | ICD-10-CM | POA: Diagnosis not present

## 2020-10-27 DIAGNOSIS — R296 Repeated falls: Secondary | ICD-10-CM | POA: Diagnosis not present

## 2020-10-27 DIAGNOSIS — E119 Type 2 diabetes mellitus without complications: Secondary | ICD-10-CM | POA: Diagnosis not present

## 2020-10-27 DIAGNOSIS — R278 Other lack of coordination: Secondary | ICD-10-CM | POA: Diagnosis not present

## 2020-10-27 DIAGNOSIS — M6281 Muscle weakness (generalized): Secondary | ICD-10-CM | POA: Diagnosis not present

## 2020-10-27 DIAGNOSIS — R488 Other symbolic dysfunctions: Secondary | ICD-10-CM | POA: Diagnosis not present

## 2020-10-27 DIAGNOSIS — R55 Syncope and collapse: Secondary | ICD-10-CM | POA: Diagnosis not present

## 2020-10-27 DIAGNOSIS — N184 Chronic kidney disease, stage 4 (severe): Secondary | ICD-10-CM | POA: Diagnosis not present

## 2020-10-28 DIAGNOSIS — R296 Repeated falls: Secondary | ICD-10-CM | POA: Diagnosis not present

## 2020-10-28 DIAGNOSIS — R2681 Unsteadiness on feet: Secondary | ICD-10-CM | POA: Diagnosis not present

## 2020-10-28 DIAGNOSIS — R278 Other lack of coordination: Secondary | ICD-10-CM | POA: Diagnosis not present

## 2020-10-28 DIAGNOSIS — M6281 Muscle weakness (generalized): Secondary | ICD-10-CM | POA: Diagnosis not present

## 2020-10-28 DIAGNOSIS — E119 Type 2 diabetes mellitus without complications: Secondary | ICD-10-CM | POA: Diagnosis not present

## 2020-10-28 DIAGNOSIS — R55 Syncope and collapse: Secondary | ICD-10-CM | POA: Diagnosis not present

## 2020-10-28 DIAGNOSIS — I679 Cerebrovascular disease, unspecified: Secondary | ICD-10-CM | POA: Diagnosis not present

## 2020-10-28 DIAGNOSIS — R2689 Other abnormalities of gait and mobility: Secondary | ICD-10-CM | POA: Diagnosis not present

## 2020-10-28 DIAGNOSIS — R488 Other symbolic dysfunctions: Secondary | ICD-10-CM | POA: Diagnosis not present

## 2020-10-28 DIAGNOSIS — I959 Hypotension, unspecified: Secondary | ICD-10-CM | POA: Diagnosis not present

## 2020-10-28 DIAGNOSIS — N184 Chronic kidney disease, stage 4 (severe): Secondary | ICD-10-CM | POA: Diagnosis not present

## 2020-10-29 DIAGNOSIS — R488 Other symbolic dysfunctions: Secondary | ICD-10-CM | POA: Diagnosis not present

## 2020-10-29 DIAGNOSIS — M6281 Muscle weakness (generalized): Secondary | ICD-10-CM | POA: Diagnosis not present

## 2020-10-29 DIAGNOSIS — R2681 Unsteadiness on feet: Secondary | ICD-10-CM | POA: Diagnosis not present

## 2020-10-29 DIAGNOSIS — I679 Cerebrovascular disease, unspecified: Secondary | ICD-10-CM | POA: Diagnosis not present

## 2020-10-29 DIAGNOSIS — R55 Syncope and collapse: Secondary | ICD-10-CM | POA: Diagnosis not present

## 2020-10-29 DIAGNOSIS — R2689 Other abnormalities of gait and mobility: Secondary | ICD-10-CM | POA: Diagnosis not present

## 2020-10-29 DIAGNOSIS — E119 Type 2 diabetes mellitus without complications: Secondary | ICD-10-CM | POA: Diagnosis not present

## 2020-10-29 DIAGNOSIS — R296 Repeated falls: Secondary | ICD-10-CM | POA: Diagnosis not present

## 2020-10-29 DIAGNOSIS — R278 Other lack of coordination: Secondary | ICD-10-CM | POA: Diagnosis not present

## 2020-10-29 DIAGNOSIS — N184 Chronic kidney disease, stage 4 (severe): Secondary | ICD-10-CM | POA: Diagnosis not present

## 2020-10-30 DIAGNOSIS — M25562 Pain in left knee: Secondary | ICD-10-CM | POA: Diagnosis not present

## 2020-10-30 DIAGNOSIS — E119 Type 2 diabetes mellitus without complications: Secondary | ICD-10-CM | POA: Diagnosis not present

## 2020-10-30 DIAGNOSIS — R69 Illness, unspecified: Secondary | ICD-10-CM | POA: Diagnosis not present

## 2020-10-30 DIAGNOSIS — I10 Essential (primary) hypertension: Secondary | ICD-10-CM | POA: Diagnosis not present

## 2020-11-02 DIAGNOSIS — R2681 Unsteadiness on feet: Secondary | ICD-10-CM | POA: Diagnosis not present

## 2020-11-02 DIAGNOSIS — R296 Repeated falls: Secondary | ICD-10-CM | POA: Diagnosis not present

## 2020-11-02 DIAGNOSIS — I10 Essential (primary) hypertension: Secondary | ICD-10-CM | POA: Diagnosis not present

## 2020-11-02 DIAGNOSIS — E782 Mixed hyperlipidemia: Secondary | ICD-10-CM | POA: Diagnosis not present

## 2020-11-02 DIAGNOSIS — R488 Other symbolic dysfunctions: Secondary | ICD-10-CM | POA: Diagnosis not present

## 2020-11-02 DIAGNOSIS — I679 Cerebrovascular disease, unspecified: Secondary | ICD-10-CM | POA: Diagnosis not present

## 2020-11-02 DIAGNOSIS — R278 Other lack of coordination: Secondary | ICD-10-CM | POA: Diagnosis not present

## 2020-11-02 DIAGNOSIS — R2689 Other abnormalities of gait and mobility: Secondary | ICD-10-CM | POA: Diagnosis not present

## 2020-11-02 DIAGNOSIS — Z79899 Other long term (current) drug therapy: Secondary | ICD-10-CM | POA: Diagnosis not present

## 2020-11-02 DIAGNOSIS — E119 Type 2 diabetes mellitus without complications: Secondary | ICD-10-CM | POA: Diagnosis not present

## 2020-11-02 DIAGNOSIS — R55 Syncope and collapse: Secondary | ICD-10-CM | POA: Diagnosis not present

## 2020-11-02 DIAGNOSIS — N184 Chronic kidney disease, stage 4 (severe): Secondary | ICD-10-CM | POA: Diagnosis not present

## 2020-11-02 DIAGNOSIS — M6281 Muscle weakness (generalized): Secondary | ICD-10-CM | POA: Diagnosis not present

## 2020-11-02 DIAGNOSIS — R69 Illness, unspecified: Secondary | ICD-10-CM | POA: Diagnosis not present

## 2020-11-03 ENCOUNTER — Emergency Department (HOSPITAL_COMMUNITY): Payer: Medicare HMO

## 2020-11-03 ENCOUNTER — Emergency Department (HOSPITAL_COMMUNITY)
Admission: EM | Admit: 2020-11-03 | Discharge: 2020-11-03 | Disposition: A | Discharge status: Skilled Nursing Facility | Payer: Medicare HMO | Attending: Emergency Medicine | Admitting: Emergency Medicine

## 2020-11-03 ENCOUNTER — Other Ambulatory Visit: Payer: Self-pay

## 2020-11-03 ENCOUNTER — Encounter (HOSPITAL_COMMUNITY): Payer: Self-pay

## 2020-11-03 DIAGNOSIS — R278 Other lack of coordination: Secondary | ICD-10-CM | POA: Diagnosis not present

## 2020-11-03 DIAGNOSIS — S0003XA Contusion of scalp, initial encounter: Secondary | ICD-10-CM | POA: Insufficient documentation

## 2020-11-03 DIAGNOSIS — W19XXXA Unspecified fall, initial encounter: Secondary | ICD-10-CM | POA: Insufficient documentation

## 2020-11-03 DIAGNOSIS — J3489 Other specified disorders of nose and nasal sinuses: Secondary | ICD-10-CM | POA: Diagnosis not present

## 2020-11-03 DIAGNOSIS — M47812 Spondylosis without myelopathy or radiculopathy, cervical region: Secondary | ICD-10-CM | POA: Diagnosis not present

## 2020-11-03 DIAGNOSIS — S22000A Wedge compression fracture of unspecified thoracic vertebra, initial encounter for closed fracture: Secondary | ICD-10-CM | POA: Insufficient documentation

## 2020-11-03 DIAGNOSIS — M6281 Muscle weakness (generalized): Secondary | ICD-10-CM | POA: Diagnosis not present

## 2020-11-03 DIAGNOSIS — M25552 Pain in left hip: Secondary | ICD-10-CM | POA: Diagnosis not present

## 2020-11-03 DIAGNOSIS — I120 Hypertensive chronic kidney disease with stage 5 chronic kidney disease or end stage renal disease: Secondary | ICD-10-CM | POA: Diagnosis not present

## 2020-11-03 DIAGNOSIS — J439 Emphysema, unspecified: Secondary | ICD-10-CM | POA: Diagnosis not present

## 2020-11-03 DIAGNOSIS — Z79899 Other long term (current) drug therapy: Secondary | ICD-10-CM | POA: Insufficient documentation

## 2020-11-03 DIAGNOSIS — S299XXA Unspecified injury of thorax, initial encounter: Secondary | ICD-10-CM | POA: Diagnosis not present

## 2020-11-03 DIAGNOSIS — E1122 Type 2 diabetes mellitus with diabetic chronic kidney disease: Secondary | ICD-10-CM | POA: Diagnosis not present

## 2020-11-03 DIAGNOSIS — R2681 Unsteadiness on feet: Secondary | ICD-10-CM | POA: Diagnosis not present

## 2020-11-03 DIAGNOSIS — R5381 Other malaise: Secondary | ICD-10-CM | POA: Diagnosis not present

## 2020-11-03 DIAGNOSIS — R488 Other symbolic dysfunctions: Secondary | ICD-10-CM | POA: Diagnosis not present

## 2020-11-03 DIAGNOSIS — R296 Repeated falls: Secondary | ICD-10-CM | POA: Diagnosis not present

## 2020-11-03 DIAGNOSIS — R55 Syncope and collapse: Secondary | ICD-10-CM | POA: Diagnosis not present

## 2020-11-03 DIAGNOSIS — Z7982 Long term (current) use of aspirin: Secondary | ICD-10-CM | POA: Diagnosis not present

## 2020-11-03 DIAGNOSIS — E119 Type 2 diabetes mellitus without complications: Secondary | ICD-10-CM | POA: Diagnosis not present

## 2020-11-03 DIAGNOSIS — S0990XA Unspecified injury of head, initial encounter: Secondary | ICD-10-CM | POA: Diagnosis not present

## 2020-11-03 DIAGNOSIS — M25562 Pain in left knee: Secondary | ICD-10-CM | POA: Diagnosis not present

## 2020-11-03 DIAGNOSIS — I679 Cerebrovascular disease, unspecified: Secondary | ICD-10-CM | POA: Diagnosis not present

## 2020-11-03 DIAGNOSIS — R9431 Abnormal electrocardiogram [ECG] [EKG]: Secondary | ICD-10-CM | POA: Diagnosis not present

## 2020-11-03 DIAGNOSIS — R0602 Shortness of breath: Secondary | ICD-10-CM | POA: Diagnosis not present

## 2020-11-03 DIAGNOSIS — N185 Chronic kidney disease, stage 5: Secondary | ICD-10-CM | POA: Insufficient documentation

## 2020-11-03 DIAGNOSIS — Z87891 Personal history of nicotine dependence: Secondary | ICD-10-CM | POA: Diagnosis not present

## 2020-11-03 DIAGNOSIS — Z743 Need for continuous supervision: Secondary | ICD-10-CM | POA: Diagnosis not present

## 2020-11-03 DIAGNOSIS — R69 Illness, unspecified: Secondary | ICD-10-CM | POA: Diagnosis not present

## 2020-11-03 DIAGNOSIS — M4854XA Collapsed vertebra, not elsewhere classified, thoracic region, initial encounter for fracture: Secondary | ICD-10-CM

## 2020-11-03 DIAGNOSIS — Z7401 Bed confinement status: Secondary | ICD-10-CM | POA: Diagnosis not present

## 2020-11-03 DIAGNOSIS — S199XXA Unspecified injury of neck, initial encounter: Secondary | ICD-10-CM | POA: Diagnosis not present

## 2020-11-03 DIAGNOSIS — N184 Chronic kidney disease, stage 4 (severe): Secondary | ICD-10-CM | POA: Diagnosis not present

## 2020-11-03 DIAGNOSIS — R2689 Other abnormalities of gait and mobility: Secondary | ICD-10-CM | POA: Diagnosis not present

## 2020-11-03 LAB — URINALYSIS, ROUTINE W REFLEX MICROSCOPIC
Bilirubin Urine: NEGATIVE
Glucose, UA: NEGATIVE mg/dL
Ketones, ur: NEGATIVE mg/dL
Nitrite: NEGATIVE
Protein, ur: 30 mg/dL — AB
Specific Gravity, Urine: 1.015 (ref 1.005–1.030)
pH: 5 (ref 5.0–8.0)

## 2020-11-03 LAB — COMPREHENSIVE METABOLIC PANEL
ALT: 10 U/L (ref 0–44)
AST: 18 U/L (ref 15–41)
Albumin: 3.7 g/dL (ref 3.5–5.0)
Alkaline Phosphatase: 86 U/L (ref 38–126)
Anion gap: 11 (ref 5–15)
BUN: 32 mg/dL — ABNORMAL HIGH (ref 8–23)
CO2: 20 mmol/L — ABNORMAL LOW (ref 22–32)
Calcium: 8.9 mg/dL (ref 8.9–10.3)
Chloride: 106 mmol/L (ref 98–111)
Creatinine, Ser: 2.22 mg/dL — ABNORMAL HIGH (ref 0.44–1.00)
GFR, Estimated: 23 mL/min — ABNORMAL LOW (ref >60)
Glucose, Bld: 204 mg/dL — ABNORMAL HIGH (ref 70–99)
Potassium: 3.7 mmol/L (ref 3.5–5.1)
Sodium: 137 mmol/L (ref 135–145)
Total Bilirubin: 0.7 mg/dL (ref 0.3–1.2)
Total Protein: 7.2 g/dL (ref 6.5–8.1)

## 2020-11-03 LAB — CBC WITH DIFFERENTIAL/PLATELET
Abs Immature Granulocytes: 0.04 10*3/uL (ref 0.00–0.07)
Basophils Absolute: 0.1 10*3/uL (ref 0.0–0.1)
Basophils Relative: 1 %
Eosinophils Absolute: 0.1 10*3/uL (ref 0.0–0.5)
Eosinophils Relative: 1 %
HCT: 36.2 % (ref 36.0–46.0)
Hemoglobin: 11.8 g/dL — ABNORMAL LOW (ref 12.0–15.0)
Immature Granulocytes: 1 %
Lymphocytes Relative: 16 %
Lymphs Abs: 1.1 10*3/uL (ref 0.7–4.0)
MCH: 29.9 pg (ref 26.0–34.0)
MCHC: 32.6 g/dL (ref 30.0–36.0)
MCV: 91.9 fL (ref 80.0–100.0)
Monocytes Absolute: 0.7 10*3/uL (ref 0.1–1.0)
Monocytes Relative: 10 %
Neutro Abs: 5 10*3/uL (ref 1.7–7.7)
Neutrophils Relative %: 71 %
Platelets: 210 10*3/uL (ref 150–400)
RBC: 3.94 MIL/uL (ref 3.87–5.11)
RDW: 13.8 % (ref 11.5–15.5)
WBC: 7 10*3/uL (ref 4.0–10.5)
nRBC: 0 % (ref 0.0–0.2)

## 2020-11-03 IMAGING — CT CT CERVICAL SPINE W/O CM
3 series · 12 of 33 positions shown, 14 images · non-contrast
Comparison: Head CT dated [DATE].

CLINICAL DATA: 74-year-old female with head trauma.

EXAM:
CT HEAD WITHOUT CONTRAST
CT CERVICAL SPINE WITHOUT CONTRAST
TECHNIQUE: Multidetector CT imaging of the head and cervical spine was
performed following the standard protocol without intravenous
contrast. Multiplanar CT image reconstructions of the cervical spine
were also generated.

[Series 4: c_spine 2.0 st · axial · 0.33mm/px · z∈[-726,-608]mm · 4 of 85 slices shown, 5 images]
[im 13/85  soft-tissue]
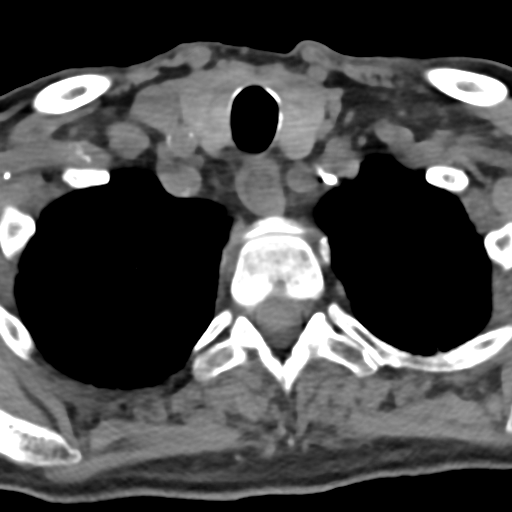
[im 13/85  bone]
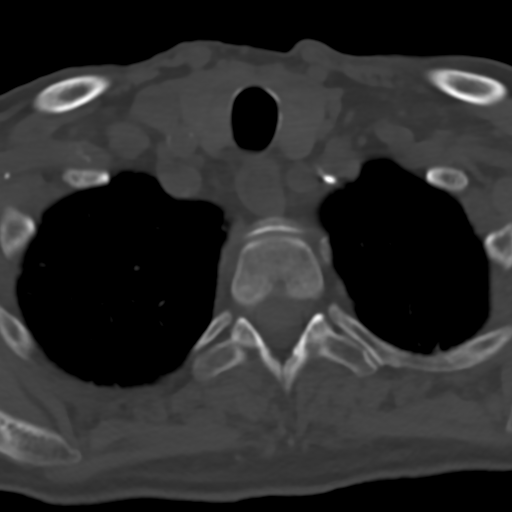
[im 33/85  bone]
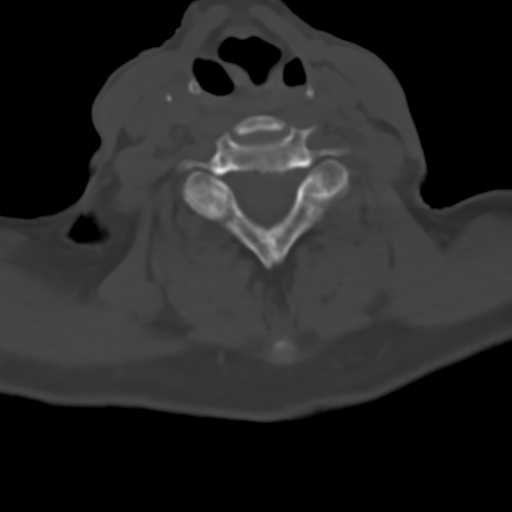
[im 52/85  bone]
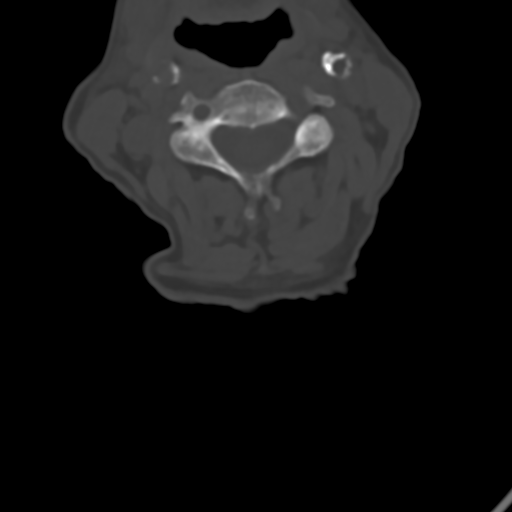
[im 72/85  bone]
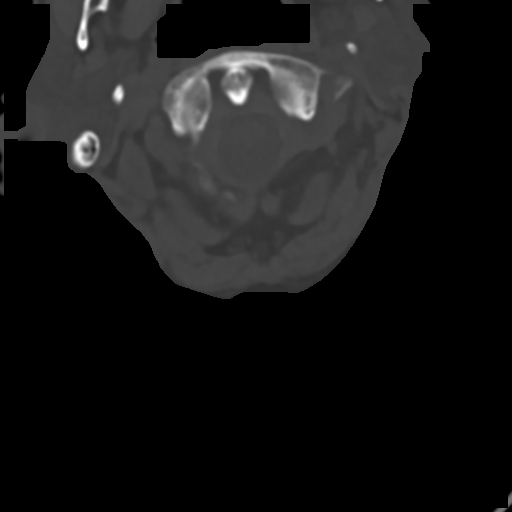

[Series 8: c_spine 2.0 sag bone · sagittal · 0.28mm/px · 5 of 61 slices shown, 6 images]
[im 21/61  bone]
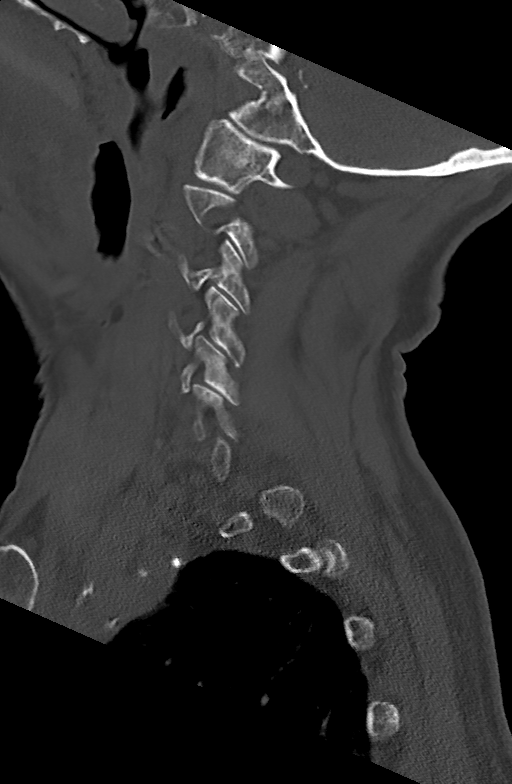
[im 26/61  bone]
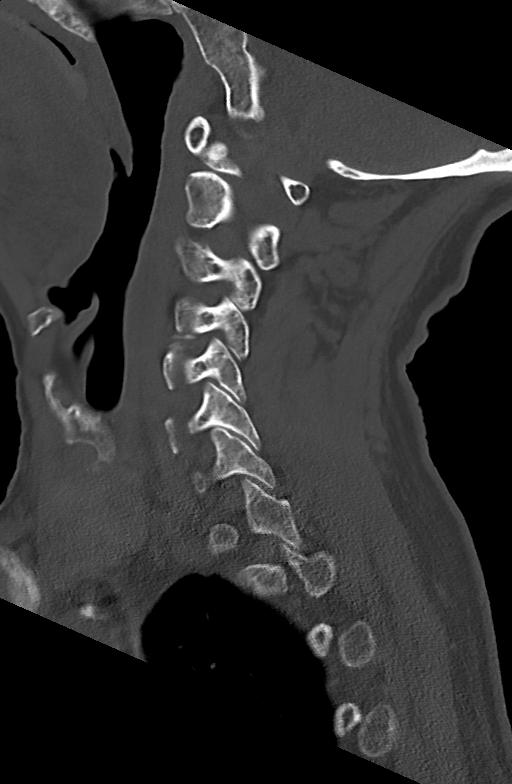
[im 31/61  soft-tissue]
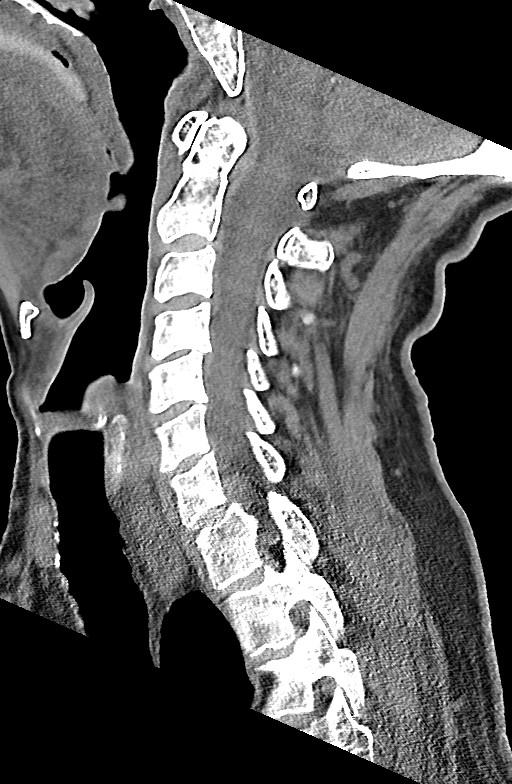
[im 31/61  bone]
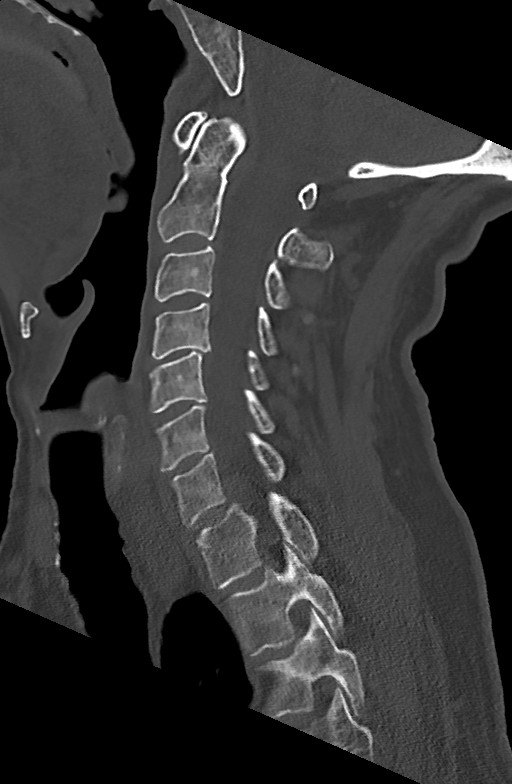
[im 36/61  bone]
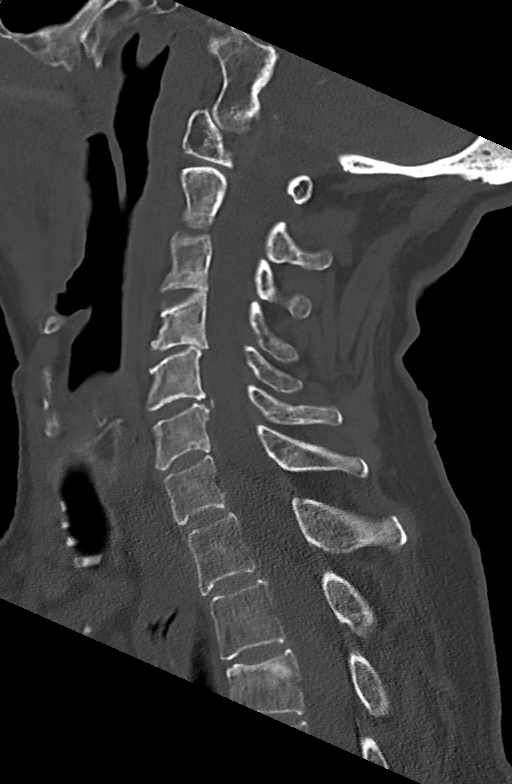
[im 41/61  bone]
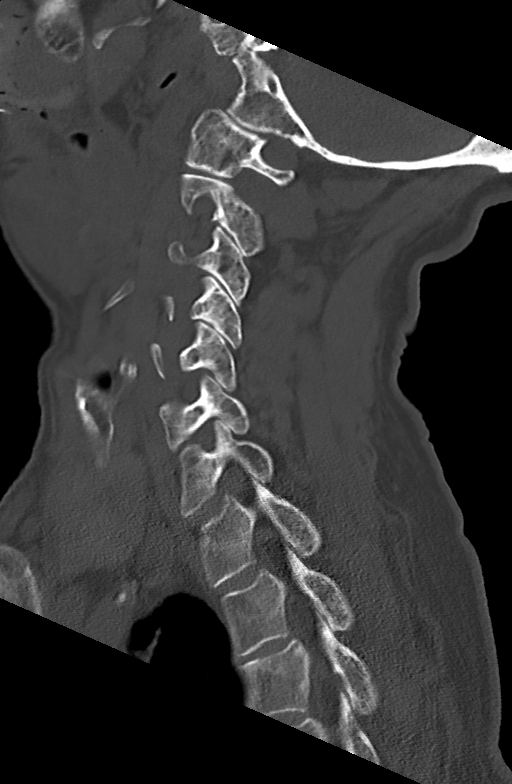

[Series 9: c_spine 2.0 cor bone · coronal · 0.25mm/px · 3 of 68 slices shown]
[im 16/68  bone]
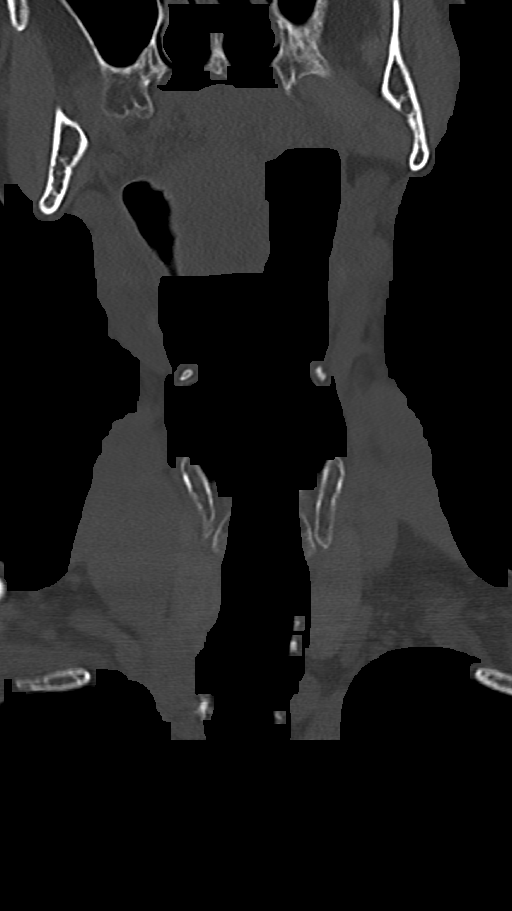
[im 28/68  bone]
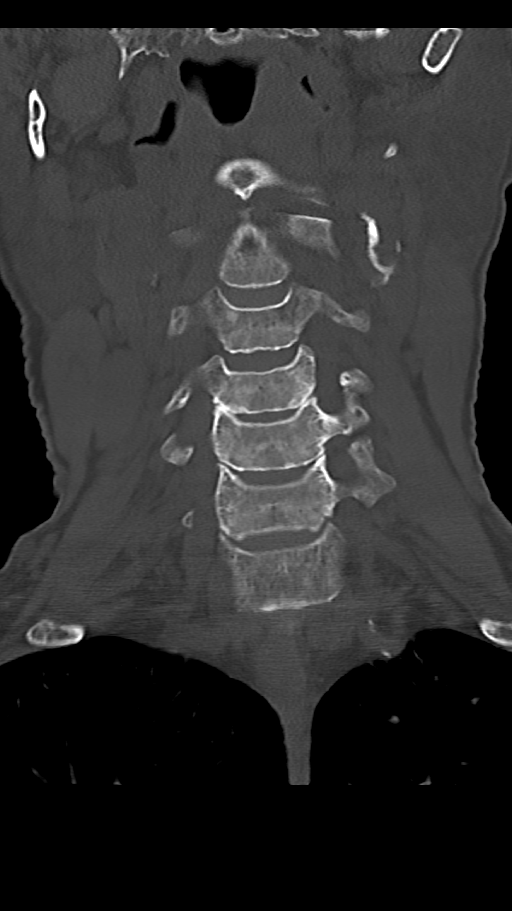
[im 40/68  bone]
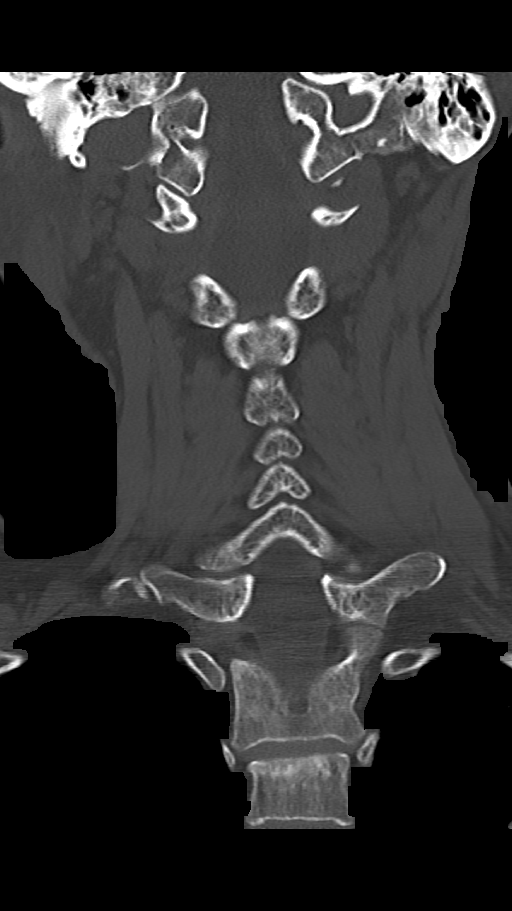

[12 of 33 positions shown; findings below may reference images not displayed]

FINDINGS: CT HEAD FINDINGS

Brain: Mild age-related atrophy and moderate chronic microvascular
ischemic changes. Small right basal ganglia old lacunar infarcts.
There is no acute intracranial hemorrhage. No mass effect midline
shift. No extra-axial fluid collection.

Vascular: No hyperdense vessel or unexpected calcification.

Skull: Normal. Negative for fracture or focal lesion.

Sinuses/Orbits: Mild mucoperiosteal thickening of paranasal sinuses.
No air-fluid level. The mastoid air cells are clear.

Other: None

CT CERVICAL SPINE FINDINGS

Alignment: No acute subluxation.

Skull base and vertebrae: No acute cervical spine fracture. The
bones are osteopenic. Mild sclerotic changes and compression of
superior endplate of T3, new since the CT of [DATE]. Correlation
with clinical exam and point tenderness recommended. No
retropulsion.

Soft tissues and spinal canal: No prevertebral fluid or swelling. No
visible canal hematoma.

Disc levels: No acute findings. Degenerative changes with disc space
narrowing primarily at C4-C5.

Upper chest: Emphysema.

Other: Advanced bilateral carotid bulb calcified plaques.
IMPRESSION: 1. No acute intracranial pathology.
2. Age-related atrophy and chronic microvascular ischemic changes.
3. No acute/traumatic cervical spine pathology.
4. Mild age indeterminate compression fracture of superior endplate
of T3, new since the CT of [DATE]. Correlation with clinical
exam and point tenderness recommended.

## 2020-11-03 IMAGING — CT CT HEAD W/O CM
4 series · 16 of 47 positions shown, 18 images · non-contrast
Comparison: Head CT dated [DATE].

CLINICAL DATA: 74-year-old female with head trauma.

EXAM:
CT HEAD WITHOUT CONTRAST
CT CERVICAL SPINE WITHOUT CONTRAST
TECHNIQUE: Multidetector CT imaging of the head and cervical spine was
performed following the standard protocol without intravenous
contrast. Multiplanar CT image reconstructions of the cervical spine
were also generated.

[Series 3: head bone · axial · 0.44mm/px · z∈[-580,-548]mm · 3 of 83 slices shown]
[im 9/83  bone]
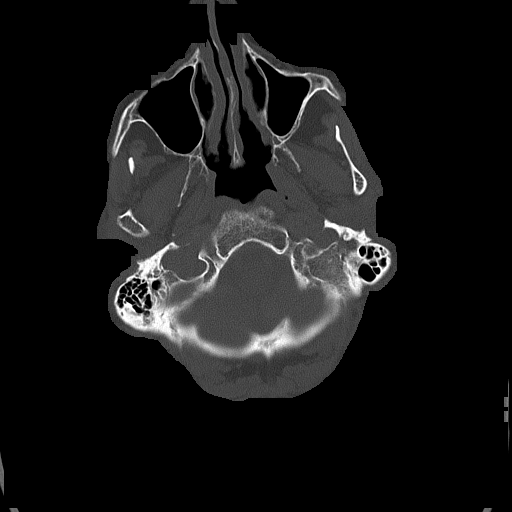
[im 17/83  bone]
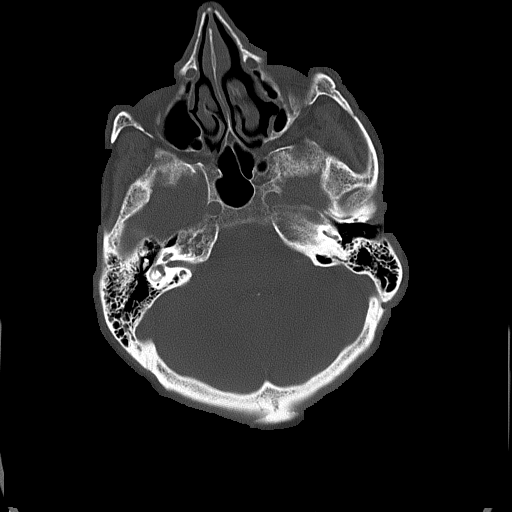
[im 25/83  bone]
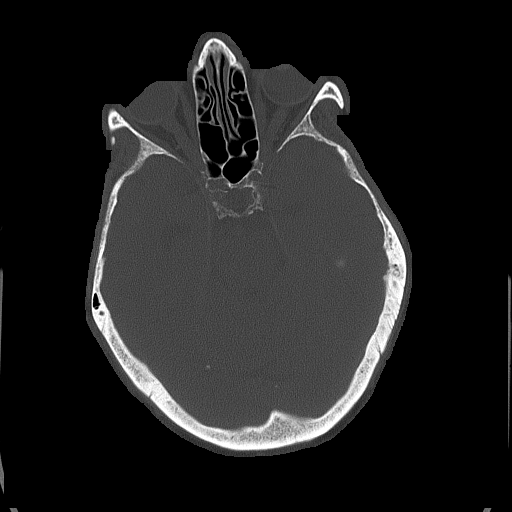

[Series 4: head without cor · coronal · non-contrast · 0.32mm/px · 3 of 73 slices shown]
[im 25/73  brain]
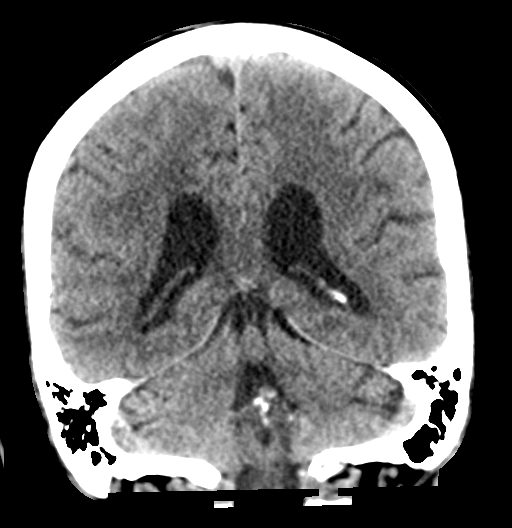
[im 33/73  brain]
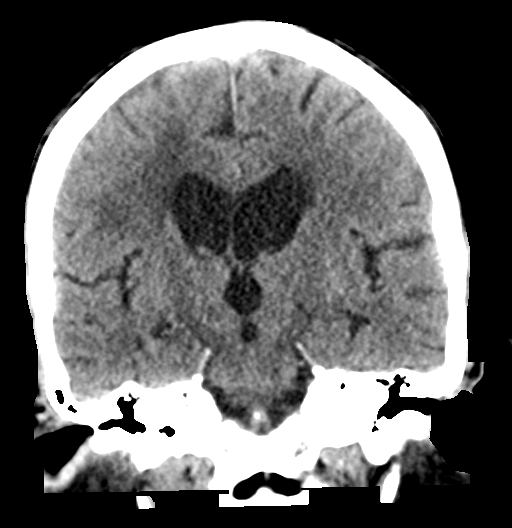
[im 41/73  brain]
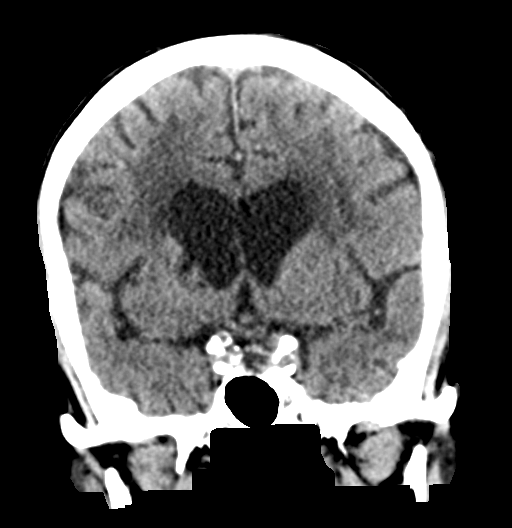

[Series 5: head without sag · sagittal · non-contrast · 0.33mm/px · 3 of 59 slices shown]
[im 20/59  brain]
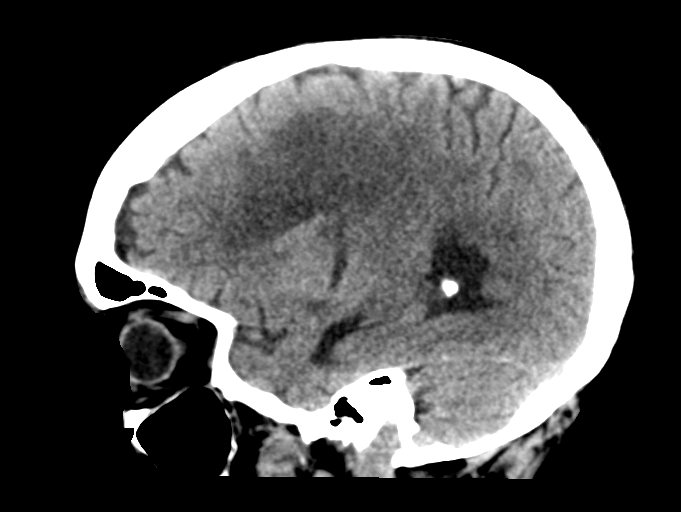
[im 30/59  brain]
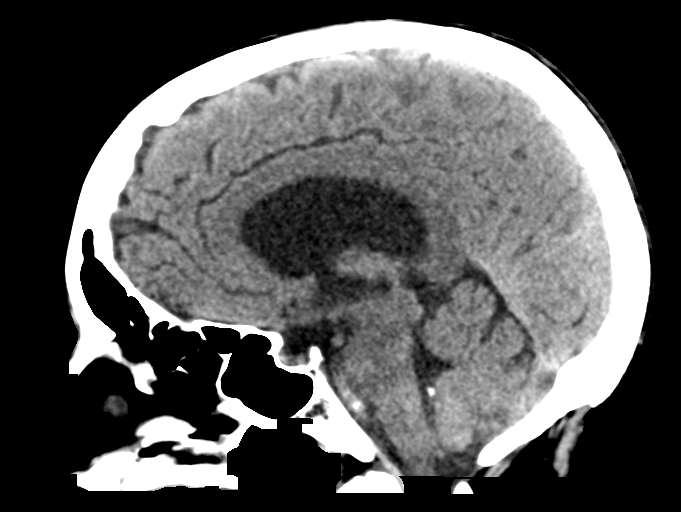
[im 39/59  brain]
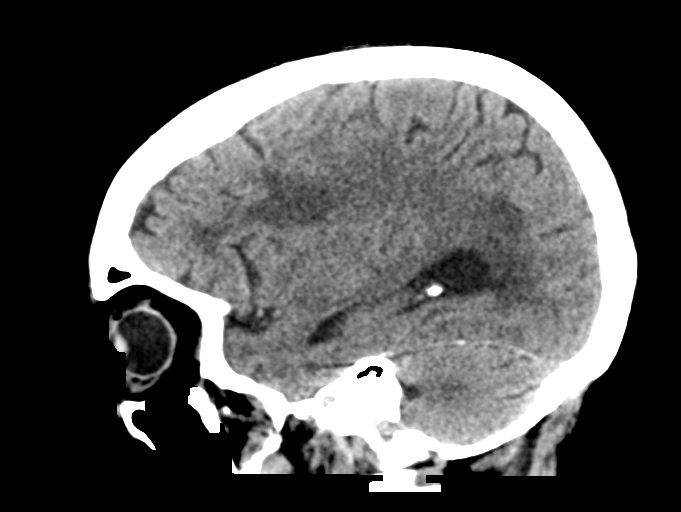

[Series 6: head without · axial · non-contrast · 0.44mm/px · z∈[-576,-456]mm · 7 of 34 slices shown, 9 images]
[im 5/34  brain]
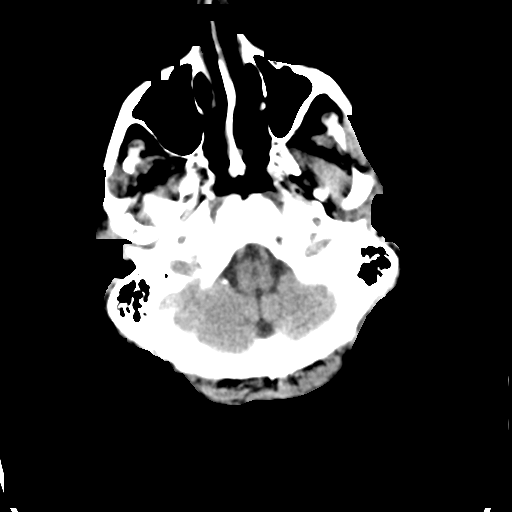
[im 5/34  bone]
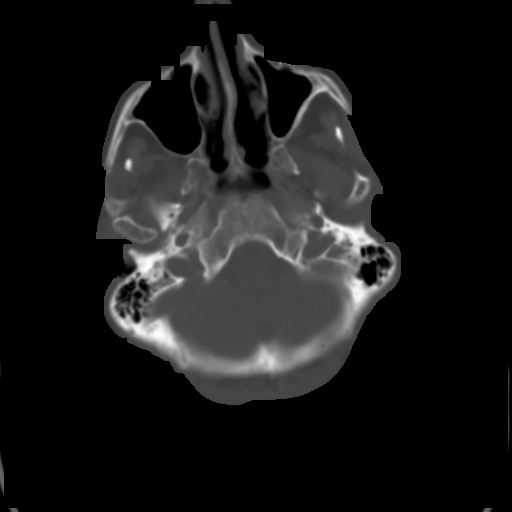
[im 9/34  brain]
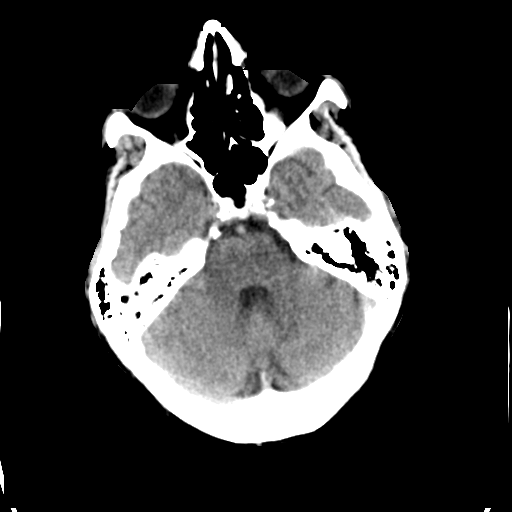
[im 13/34  brain]
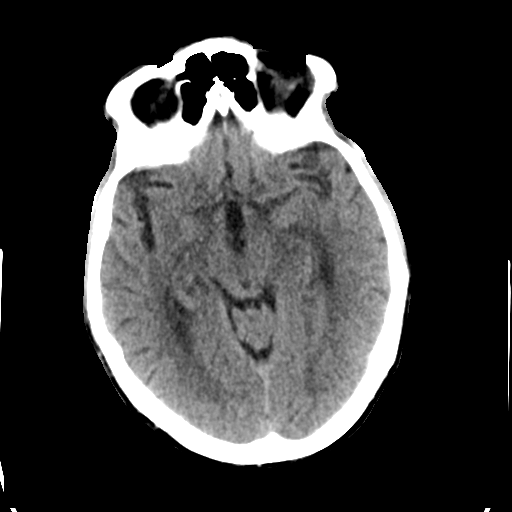
[im 17/34  brain]
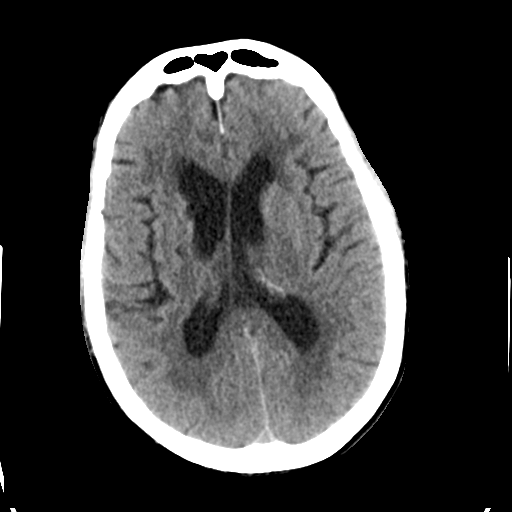
[im 21/34  brain]
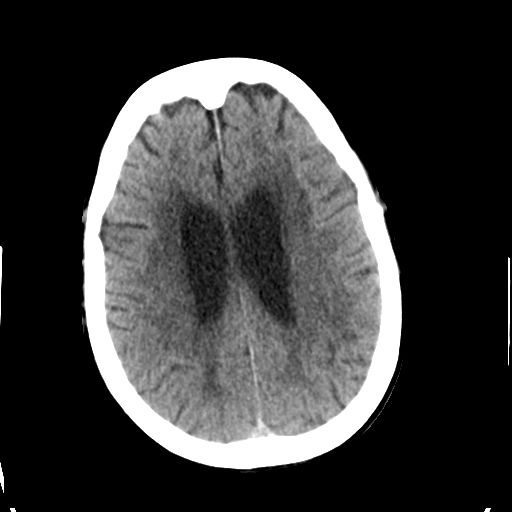
[im 21/34  bone]
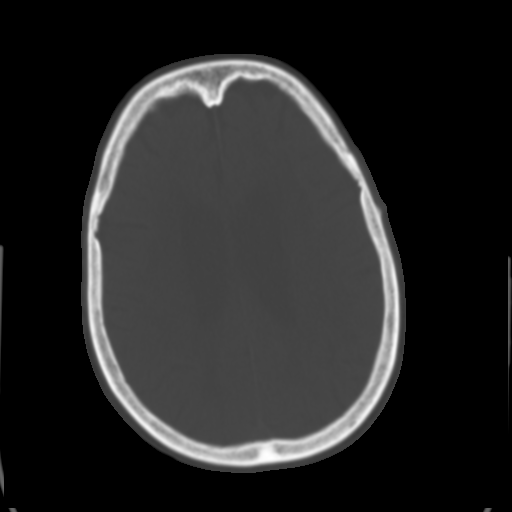
[im 25/34  brain]
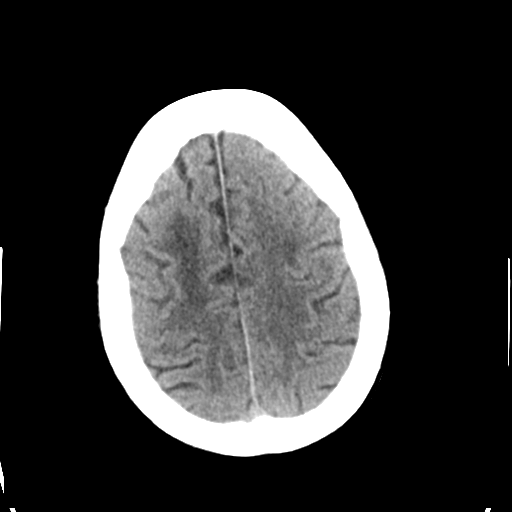
[im 29/34  brain]
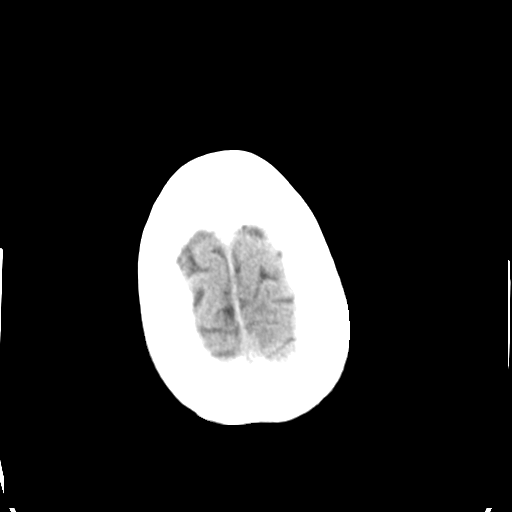

[16 of 47 positions shown; findings below may reference images not displayed]

FINDINGS: CT HEAD FINDINGS

Brain: Mild age-related atrophy and moderate chronic microvascular
ischemic changes. Small right basal ganglia old lacunar infarcts.
There is no acute intracranial hemorrhage. No mass effect midline
shift. No extra-axial fluid collection.

Vascular: No hyperdense vessel or unexpected calcification.

Skull: Normal. Negative for fracture or focal lesion.

Sinuses/Orbits: Mild mucoperiosteal thickening of paranasal sinuses.
No air-fluid level. The mastoid air cells are clear.

Other: None

CT CERVICAL SPINE FINDINGS

Alignment: No acute subluxation.

Skull base and vertebrae: No acute cervical spine fracture. The
bones are osteopenic. Mild sclerotic changes and compression of
superior endplate of T3, new since the CT of [DATE]. Correlation
with clinical exam and point tenderness recommended. No
retropulsion.

Soft tissues and spinal canal: No prevertebral fluid or swelling. No
visible canal hematoma.

Disc levels: No acute findings. Degenerative changes with disc space
narrowing primarily at C4-C5.

Upper chest: Emphysema.

Other: Advanced bilateral carotid bulb calcified plaques.
IMPRESSION: 1. No acute intracranial pathology.
2. Age-related atrophy and chronic microvascular ischemic changes.
3. No acute/traumatic cervical spine pathology.
4. Mild age indeterminate compression fracture of superior endplate
of T3, new since the CT of [DATE]. Correlation with clinical
exam and point tenderness recommended.

## 2020-11-03 MED ORDER — SODIUM CHLORIDE 0.9 % IV BOLUS
500.0000 mL | Freq: Once | INTRAVENOUS | Status: AC
  Administered 2020-11-03: 500 mL via INTRAVENOUS

## 2020-11-03 MED ORDER — ACETAMINOPHEN 325 MG PO TABS
650.0000 mg | ORAL_TABLET | Freq: Once | ORAL | Status: AC
  Administered 2020-11-03: 650 mg via ORAL
  Filled 2020-11-03: qty 2

## 2020-11-03 NOTE — ED Notes (Addendum)
Assigned rn in code stroke, this rn gave ptar the proper documentation including avs

## 2020-11-03 NOTE — ED Triage Notes (Signed)
Pt BIB [facility] from [facility] for an unwitnessed fall. Pt was outside in the courtyard around 215 and fell, another resident had to go get staff. Pt denies any LOC. Pt has a hematoma to the back of the head. Pt c/o left hip pain.

## 2020-11-03 NOTE — ED Provider Notes (Signed)
[facility] EMERGENCY DEPARTMENT Provider Note   CSN: [id number] Arrival date & time: 11/03/20  1529     History Chief Complaint  Patient presents with  . Fall    Sandra Sheppard is a 75 y.o. female.  75 y.o female with a PMH of CKD, Afib, Carotid stenosis presents to the ED via EMS from a [facility].  Patient had an unwitnessed fall today, while she was sitting outside smoking a cigarette, according to bystanders she fell forward.  Patient was immediately picked up by bystanders, and was alert at the time of incident.  Family member at the bedside reports the same.  Patient is complaining of pain to the top of her head where she has a small goose egg present.  There is also pain to the left hip and left knee.  She did suffer from a fall several weeks ago, had a negative left knee x-ray.  Patient reports fall was not mechanical, states that she has had episodes in the past where her blood pressure drops when she stands.  She is also supposed to be on medication for a UTI, began the antibiotic treatment yesterday.  She denies any headache, neck pain, chest pain, shortness of breath.  No focal weakness.  The history is provided by the patient.  Fall This is a new problem. The current episode started 3 to 5 hours ago. Pertinent negatives include no chest pain, no abdominal pain, no headaches and no shortness of breath.       Past Medical History:  Diagnosis Date  . ABDOMINAL AORTIC ANEURYSM   . Atrial fibrillation ([place])   . CAROTID STENOSIS   . CKD (chronic kidney disease), stage V ([place])   . Complication of anesthesia   . Confusion   . CVD (cardiovascular disease)   . Diabetes ([place])    no meds at present time  . FIBRILLATION, ATRIAL   . HYPERCHOLESTEROLEMIA  IIA   . HYPERTENSION, MILD   . MURMUR   . Osteoporosis   . PERIPHERAL VASCULAR DISEASE   . PONV (postoperative nausea and vomiting)   . STENOSIS, MITRAL AND AORTIC VALVES     Patient Active Problem  List   Diagnosis Date Noted  . Syncope and collapse 08/17/2020  . Protein-calorie malnutrition, severe 08/17/2020  . Near syncope 08/14/2020  . CKD (chronic kidney disease) stage 4, GFR 15-29 ml/min (HCC) 08/13/2020  . Tobacco abuse 09/22/2019  . Educated about COVID-19 virus infection 09/22/2019  . Asymptomatic bilateral carotid artery stenosis 09/22/2019  . Acute renal failure with acute tubular necrosis superimposed on stage 3 chronic kidney disease ([place])   . Palliative care encounter   . Hypocalcemia   . FTT (failure to thrive) in adult   . AKI (acute kidney injury) ([place]) 08/08/2018  . Hyperkalemia 08/08/2018  . Metabolic acidosis, increased anion gap 08/08/2018  . Normocytic anemia 08/08/2018  . Diabetes mellitus type II, non insulin dependent ([place]) 08/08/2018  . Bilateral carotid artery disease ([place]) 07/12/2018  . Dyslipidemia 07/12/2018  . Vomiting 07/04/2016  . Gastroenteritis 07/04/2016  . CKD (chronic kidney disease) stage 3, GFR 30-59 ml/min (HCC) 07/04/2016  . Occlusion and stenosis of carotid artery 04/28/2010  . ABDOMINAL AORTIC ANEURYSM 04/28/2010  . Peripheral vascular disease ([place]) 12/08/2008  . STENOSIS, MITRAL AND AORTIC VALVES 10/07/2008  . Paroxysmal atrial fibrillation ([place]) 10/07/2008  . H/O aortic valve replacement 08/14/2008  . HYPERCHOLESTEROLEMIA  IIA 06/23/2008  . HYPERTENSION, MILD 06/23/2008  . MURMUR 06/23/2008  Past Surgical History:  Procedure Laterality Date  . AORTIC VALVE REPLACEMENT     08/14/2008.  Dr. Roxy Manns  . MYRINGOTOMY WITH TUBE PLACEMENT Bilateral 03/01/2019   Procedure: MYRINGOTOMY WITH  T TUBE PLACEMENT;  Surgeon: Leta Baptist, MD;  Location: [facility];  Service: ENT;  Laterality: Bilateral;  . TOTAL ABDOMINAL HYSTERECTOMY       OB History   No obstetric history on file.     Family History  Problem Relation Age of Onset  . Colon polyps Other   . Heart disease Mother   . Heart disease Father   . Heart disease  Brother     Social History   Tobacco Use  . Smoking status: Former Smoker    Packs/day: 0.25  . Smokeless tobacco: Never Used  Substance Use Topics  . Alcohol use: No  . Drug use: Never    Home Medications Prior to Admission medications   Medication Sig Start Date End Date Taking? Authorizing Provider  amLODipine (NORVASC) 5 MG tablet Take 1 tablet (5 mg total) by mouth daily. 09/01/20 10/01/20  Manuella Ghazi, Pratik D, DO  aspirin 81 MG tablet Take 81 mg by mouth daily.    [provider]  Cholecalciferol (VITAMIN D-3) 1000 UNITS CAPS Take 2,000 Units by mouth daily.     [provider]  docusate sodium (COLACE) 100 MG capsule Take 1 capsule (100 mg total) by mouth 2 (two) times daily. 08/31/18   Hongalgi, Lenis Dickinson, MD  escitalopram (LEXAPRO) 5 MG tablet Take 5 mg by mouth daily. 08/07/20   [provider]  feeding supplement (ENSURE ENLIVE / ENSURE PLUS) LIQD Take 237 mLs by mouth 3 (three) times daily between meals. 09/01/20   Manuella Ghazi, Pratik D, DO  loratadine (CLARITIN) 10 MG tablet Take 10 mg by mouth daily as needed for allergies.    [provider]  Multiple Vitamin (MULTIVITAMIN) capsule Take 1 capsule by mouth daily.    [provider]  nicotine (NICODERM CQ - DOSED IN MG/24 HOURS) 21 mg/24hr patch Place 1 patch (21 mg total) onto the skin daily. 09/01/20   Manuella Ghazi, Pratik D, DO  pravastatin (PRAVACHOL) 20 MG tablet Take 20 mg by mouth daily. 08/04/20   [provider]  QUEtiapine (SEROQUEL) 25 MG tablet Take 1 tablet (25 mg total) by mouth at bedtime. 09/01/20   Manuella Ghazi, Pratik D, DO  senna-docusate (SENOKOT-S) 8.6-50 MG tablet Take 1 tablet by mouth at bedtime. 09/01/20   Manuella Ghazi, Pratik D, DO  vitamin B-12 1000 MCG tablet Take 1 tablet (1,000 mcg total) by mouth daily. 08/31/18   Hongalgi, Lenis Dickinson, MD  zoledronic acid (RECLAST) 5 MG/100ML SOLN injection Inject 5 mg into the vein See admin instructions. Once a year    [provider]     Allergies    Ativan [lorazepam], Codeine, Sulfonamide derivatives, and Penicillins  Review of Systems   Review of Systems  Constitutional: Negative for fever.  HENT: Negative for sore throat.   Respiratory: Negative for shortness of breath.   Cardiovascular: Negative for chest pain.  Gastrointestinal: Negative for abdominal pain, nausea and vomiting.  Genitourinary: Negative for flank pain.  Musculoskeletal: Positive for arthralgias and neck pain.  Skin: Negative for pallor and wound.  Neurological: Negative for light-headedness and headaches.  All other systems reviewed and are negative.   Physical Exam Updated Vital Signs BP (!) 139/120   Pulse 68   Temp 97.8 F (36.6 C) (Oral)   Resp 16  SpO2 100%   Physical Exam Vitals and nursing note reviewed.  Constitutional:      Appearance: Normal appearance.  HENT:     Head: Normocephalic.     Comments: Small goose egg to the crown region of her head.     Nose: Nose normal.     Mouth/Throat:     Mouth: Mucous membranes are moist.  Eyes:     Pupils: Pupils are equal, round, and reactive to light.  Cardiovascular:     Rate and Rhythm: Normal rate.     Heart sounds: Murmur heard.    Pulmonary:     Effort: Pulmonary effort is normal.     Breath sounds: No wheezing or rales.  Abdominal:     General: Abdomen is flat.     Tenderness: There is no right CVA tenderness or left CVA tenderness.  Musculoskeletal:        General: Tenderness present.     Cervical back: Normal range of motion and neck supple.     Comments: Pain with palpation and ranging to the left hip.   Skin:    General: Skin is warm and dry.  Neurological:     Mental Status: She is alert and oriented to person, place, and time.     Comments: No facial asymmetry, no dysarthria Moves all over      ED Results / Procedures / Treatments   Labs (all labs ordered are listed, but only abnormal results are displayed) Labs Reviewed  CBC WITH  DIFFERENTIAL/PLATELET - Abnormal; Notable for the following components:      Result Value   Hemoglobin 11.8 (*)    All other components within normal limits  COMPREHENSIVE METABOLIC PANEL - Abnormal; Notable for the following components:   CO2 20 (*)    Glucose, Bld 204 (*)    BUN 32 (*)    Creatinine, Ser 2.22 (*)    GFR, Estimated 23 (*)    All other components within normal limits  URINALYSIS, ROUTINE W REFLEX MICROSCOPIC - Abnormal; Notable for the following components:   APPearance HAZY (*)    Hgb urine dipstick SMALL (*)    Protein, ur 30 (*)    Leukocytes,Ua MODERATE (*)    Bacteria, UA FEW (*)    All other components within normal limits  URINE CULTURE    EKG None  Radiology CT Head Wo Contrast  Result Date: 11/03/2020 CLINICAL DATA:  75 year old female with head trauma. EXAM: CT HEAD WITHOUT CONTRAST CT CERVICAL SPINE WITHOUT CONTRAST TECHNIQUE: Multidetector CT imaging of the head and cervical spine was performed following the standard protocol without intravenous contrast. Multiplanar CT image reconstructions of the cervical spine were also generated. COMPARISON:  Head CT dated 08/13/2020. FINDINGS: CT HEAD FINDINGS Brain: Mild age-related atrophy and moderate chronic microvascular ischemic changes. Small right basal ganglia old lacunar infarcts. There is no acute intracranial hemorrhage. No mass effect midline shift. No extra-axial fluid collection. Vascular: No hyperdense vessel or unexpected calcification. Skull: Normal. Negative for fracture or focal lesion. Sinuses/Orbits: Mild mucoperiosteal thickening of paranasal sinuses. No air-fluid level. The mastoid air cells are clear. Other: None CT CERVICAL SPINE FINDINGS Alignment: No acute subluxation. Skull base and vertebrae: No acute cervical spine fracture. The bones are osteopenic. Mild sclerotic changes and compression of superior endplate of T3, new since the CT of 10/09/2019. Correlation with clinical exam and point  tenderness recommended. No retropulsion. Soft tissues and spinal canal: No prevertebral fluid or swelling. No visible canal hematoma. Disc  levels: No acute findings. Degenerative changes with disc space narrowing primarily at C4-C5. Upper chest: Emphysema. Other: Advanced bilateral carotid bulb calcified plaques. IMPRESSION: 1. No acute intracranial pathology. 2. Age-related atrophy and chronic microvascular ischemic changes. 3. No acute/traumatic cervical spine pathology. 4. Mild age indeterminate compression fracture of superior endplate of T3, new since the CT of 10/09/2019. Correlation with clinical exam and point tenderness recommended. Electronically Signed   By: Anner Crete M.D.   On: 11/03/2020 19:04   CT Cervical Spine Wo Contrast  Result Date: 11/03/2020 CLINICAL DATA:  75 year old female with head trauma. EXAM: CT HEAD WITHOUT CONTRAST CT CERVICAL SPINE WITHOUT CONTRAST TECHNIQUE: Multidetector CT imaging of the head and cervical spine was performed following the standard protocol without intravenous contrast. Multiplanar CT image reconstructions of the cervical spine were also generated. COMPARISON:  Head CT dated 08/13/2020. FINDINGS: CT HEAD FINDINGS Brain: Mild age-related atrophy and moderate chronic microvascular ischemic changes. Small right basal ganglia old lacunar infarcts. There is no acute intracranial hemorrhage. No mass effect midline shift. No extra-axial fluid collection. Vascular: No hyperdense vessel or unexpected calcification. Skull: Normal. Negative for fracture or focal lesion. Sinuses/Orbits: Mild mucoperiosteal thickening of paranasal sinuses. No air-fluid level. The mastoid air cells are clear. Other: None CT CERVICAL SPINE FINDINGS Alignment: No acute subluxation. Skull base and vertebrae: No acute cervical spine fracture. The bones are osteopenic. Mild sclerotic changes and compression of superior endplate of T3, new since the CT of 10/09/2019. Correlation with clinical  exam and point tenderness recommended. No retropulsion. Soft tissues and spinal canal: No prevertebral fluid or swelling. No visible canal hematoma. Disc levels: No acute findings. Degenerative changes with disc space narrowing primarily at C4-C5. Upper chest: Emphysema. Other: Advanced bilateral carotid bulb calcified plaques. IMPRESSION: 1. No acute intracranial pathology. 2. Age-related atrophy and chronic microvascular ischemic changes. 3. No acute/traumatic cervical spine pathology. 4. Mild age indeterminate compression fracture of superior endplate of T3, new since the CT of 10/09/2019. Correlation with clinical exam and point tenderness recommended. Electronically Signed   By: Anner Crete M.D.   On: 11/03/2020 19:04   DG Hip Unilat W or Wo Pelvis 1 View Left  Result Date: 11/03/2020 CLINICAL DATA:  Unwitnessed fall.  Left hip pain. EXAM: DG HIP (WITH OR WITHOUT PELVIS) 1V*L* COMPARISON:  None. FINDINGS: Cortical margins of the bony pelvis and left hip are intact. No fracture. Femoral head is well seated in the acetabulum. Hip joint space is preserved. No pubic rami fractures. Sacroiliac joints and pubic symphysis are congruent. Advanced vascular calcifications. IMPRESSION: No pelvic or left hip fracture. Electronically Signed   By: Keith Rake M.D.   On: 11/03/2020 17:15    Procedures Procedures   Medications Ordered in ED Medications  sodium chloride 0.9 % bolus 500 mL (0 mLs Intravenous Stopped 11/03/20 1735)  acetaminophen (TYLENOL) tablet 650 mg (650 mg Oral Given 11/03/20 1948)    ED Course  I have reviewed the triage vital signs and the nursing notes.  Pertinent labs & imaging results that were available during my care of the patient were reviewed by me and considered in my medical decision making (see chart for details).  Clinical Course as of 11/03/20 2015  Tue Nov 03, 2020  1909 Chal[place] GuestMarland Kitchen): MODERATE [JS]  1909 Bacteria, UA(!): FEW [JS]  1909 WBC, UA: 11-20 [JS]     Clinical Course User Index [JS] Janeece Fitting, PA-C   MDM Rules/Calculators/A&P   Patient presents to the ED from a [facility]  status post witnessed fall by bystanders.  Reports sitting down smoking a cigarette when she suddenly tried to stand and having her go forward onto the ground.  Vitals on arrival were within normal limits, she is afebrile, heart rate is normal.  Oxygen saturations 100% on room air.  She is maintaining her pressures.  During evaluation patient is overall non-ill, nontoxic appearing, there is a small goose egg to the crown aspect of her head.  Moves all upper and lower extremities.  There is pain with palpation of the left hip, especially with leg extension.  According to family member at the bedside, patient is currently being treated for UTI, began antibiotic treatment yesterday.  Does not have any CVA tenderness on my exam, no abdominal pain or vomiting.  Will obtain CT head, neck, left hip in order to evaluate any of her injuries.  Last fall was likely not mechanical, we will also obtain orthostatics along with labs to further evaluate.  Patient is currently not on any blood thinners, does take a daily aspirin.  CT Cervical Spine/Head: 1. No acute intracranial pathology.  2. Age-related atrophy and chronic microvascular ischemic changes.  3. No acute/traumatic cervical spine pathology.  4. Mild age indeterminate compression fracture of superior endplate  of T3, new since the CT of 10/09/2019. Correlation with clinical  exam and point tenderness recommended.     DG LEFT hip: No pelvic or left hip fracture.  Patient was also provided with Tylenol for pain control.  These results were discussed with her.  I have also discussed case with my attending Dr. Sherry Ruffing who has seen patient.  Extensive chart review with an echocardiogram last performed in 2018. LVEF 60 to 65%. EKG without any ischemic changes, no signs of ST elevations.  Orthostatic vitals are no  significant changes in blood pressure.  Heart rate did increase from 68-95 with lying to sitting.  She was provided with 500 bolus.  She was ambulated with a steady gait.  Provided with a copy of her CT report.  Patient stable for discharge, return precautions discussed at length.    Portions of this note were generated with [employer]. Dictation errors may occur despite best attempts at proofreading.  Final Clinical Impression(s) / ED Diagnoses Final diagnoses:  Fall, initial encounter  Compression fracture of thoracic spine, non-traumatic, initial encounter [facility])    Rx / DC Orders ED Discharge Orders    None       Janeece Fitting, PA-C 11/03/20 2015    Tegeler, Gwenyth Allegra, MD 11/04/20 2358

## 2020-11-03 NOTE — ED Notes (Signed)
Pt ambulated with assistance from this RN without difficulty. Provider made aware.

## 2020-11-03 NOTE — ED Notes (Signed)
[facility] called, 3rd one down

## 2020-11-03 NOTE — Discharge Instructions (Addendum)
You were provided with a copy of your CT scan on today's visit.  You may take Tylenol to help with your pain.  Please follow up with your primary care physician as needed.

## 2020-11-04 DIAGNOSIS — R278 Other lack of coordination: Secondary | ICD-10-CM | POA: Diagnosis not present

## 2020-11-04 DIAGNOSIS — R488 Other symbolic dysfunctions: Secondary | ICD-10-CM | POA: Diagnosis not present

## 2020-11-04 DIAGNOSIS — R2681 Unsteadiness on feet: Secondary | ICD-10-CM | POA: Diagnosis not present

## 2020-11-04 DIAGNOSIS — N184 Chronic kidney disease, stage 4 (severe): Secondary | ICD-10-CM | POA: Diagnosis not present

## 2020-11-04 DIAGNOSIS — E119 Type 2 diabetes mellitus without complications: Secondary | ICD-10-CM | POA: Diagnosis not present

## 2020-11-04 DIAGNOSIS — R55 Syncope and collapse: Secondary | ICD-10-CM | POA: Diagnosis not present

## 2020-11-04 DIAGNOSIS — R296 Repeated falls: Secondary | ICD-10-CM | POA: Diagnosis not present

## 2020-11-04 DIAGNOSIS — M6281 Muscle weakness (generalized): Secondary | ICD-10-CM | POA: Diagnosis not present

## 2020-11-04 DIAGNOSIS — R2689 Other abnormalities of gait and mobility: Secondary | ICD-10-CM | POA: Diagnosis not present

## 2020-11-04 DIAGNOSIS — I679 Cerebrovascular disease, unspecified: Secondary | ICD-10-CM | POA: Diagnosis not present

## 2020-11-05 DIAGNOSIS — R296 Repeated falls: Secondary | ICD-10-CM | POA: Diagnosis not present

## 2020-11-05 DIAGNOSIS — R2689 Other abnormalities of gait and mobility: Secondary | ICD-10-CM | POA: Diagnosis not present

## 2020-11-05 DIAGNOSIS — E119 Type 2 diabetes mellitus without complications: Secondary | ICD-10-CM | POA: Diagnosis not present

## 2020-11-05 DIAGNOSIS — I679 Cerebrovascular disease, unspecified: Secondary | ICD-10-CM | POA: Diagnosis not present

## 2020-11-05 DIAGNOSIS — R488 Other symbolic dysfunctions: Secondary | ICD-10-CM | POA: Diagnosis not present

## 2020-11-05 DIAGNOSIS — R2681 Unsteadiness on feet: Secondary | ICD-10-CM | POA: Diagnosis not present

## 2020-11-05 DIAGNOSIS — R278 Other lack of coordination: Secondary | ICD-10-CM | POA: Diagnosis not present

## 2020-11-05 DIAGNOSIS — N184 Chronic kidney disease, stage 4 (severe): Secondary | ICD-10-CM | POA: Diagnosis not present

## 2020-11-05 DIAGNOSIS — R55 Syncope and collapse: Secondary | ICD-10-CM | POA: Diagnosis not present

## 2020-11-05 DIAGNOSIS — M6281 Muscle weakness (generalized): Secondary | ICD-10-CM | POA: Diagnosis not present

## 2020-11-05 LAB — URINE CULTURE

## 2020-11-06 ENCOUNTER — Emergency Department (HOSPITAL_COMMUNITY)
Admission: EM | Admit: 2020-11-06 | Discharge: 2020-11-06 | Disposition: A | Discharge status: Home / Self Care | Payer: Medicare HMO | Attending: Emergency Medicine | Admitting: Emergency Medicine

## 2020-11-06 ENCOUNTER — Other Ambulatory Visit: Payer: Self-pay

## 2020-11-06 ENCOUNTER — Encounter (HOSPITAL_COMMUNITY): Payer: Self-pay | Admitting: Emergency Medicine

## 2020-11-06 ENCOUNTER — Emergency Department (HOSPITAL_COMMUNITY): Payer: Medicare HMO

## 2020-11-06 DIAGNOSIS — Z87891 Personal history of nicotine dependence: Secondary | ICD-10-CM | POA: Diagnosis not present

## 2020-11-06 DIAGNOSIS — R278 Other lack of coordination: Secondary | ICD-10-CM | POA: Diagnosis not present

## 2020-11-06 DIAGNOSIS — M6281 Muscle weakness (generalized): Secondary | ICD-10-CM | POA: Diagnosis not present

## 2020-11-06 DIAGNOSIS — N185 Chronic kidney disease, stage 5: Secondary | ICD-10-CM | POA: Diagnosis not present

## 2020-11-06 DIAGNOSIS — R404 Transient alteration of awareness: Secondary | ICD-10-CM | POA: Diagnosis not present

## 2020-11-06 DIAGNOSIS — Z743 Need for continuous supervision: Secondary | ICD-10-CM | POA: Diagnosis not present

## 2020-11-06 DIAGNOSIS — E119 Type 2 diabetes mellitus without complications: Secondary | ICD-10-CM | POA: Insufficient documentation

## 2020-11-06 DIAGNOSIS — N184 Chronic kidney disease, stage 4 (severe): Secondary | ICD-10-CM | POA: Diagnosis not present

## 2020-11-06 DIAGNOSIS — I10 Essential (primary) hypertension: Secondary | ICD-10-CM | POA: Diagnosis not present

## 2020-11-06 DIAGNOSIS — R102 Pelvic and perineal pain: Secondary | ICD-10-CM | POA: Diagnosis not present

## 2020-11-06 DIAGNOSIS — R69 Illness, unspecified: Secondary | ICD-10-CM | POA: Diagnosis not present

## 2020-11-06 DIAGNOSIS — R296 Repeated falls: Secondary | ICD-10-CM | POA: Diagnosis not present

## 2020-11-06 DIAGNOSIS — R2681 Unsteadiness on feet: Secondary | ICD-10-CM | POA: Diagnosis not present

## 2020-11-06 DIAGNOSIS — I120 Hypertensive chronic kidney disease with stage 5 chronic kidney disease or end stage renal disease: Secondary | ICD-10-CM | POA: Diagnosis not present

## 2020-11-06 DIAGNOSIS — R55 Syncope and collapse: Secondary | ICD-10-CM | POA: Diagnosis not present

## 2020-11-06 DIAGNOSIS — R5381 Other malaise: Secondary | ICD-10-CM | POA: Diagnosis not present

## 2020-11-06 DIAGNOSIS — Z79899 Other long term (current) drug therapy: Secondary | ICD-10-CM | POA: Insufficient documentation

## 2020-11-06 DIAGNOSIS — Z7982 Long term (current) use of aspirin: Secondary | ICD-10-CM | POA: Diagnosis not present

## 2020-11-06 DIAGNOSIS — I679 Cerebrovascular disease, unspecified: Secondary | ICD-10-CM | POA: Diagnosis not present

## 2020-11-06 DIAGNOSIS — R410 Disorientation, unspecified: Secondary | ICD-10-CM | POA: Diagnosis not present

## 2020-11-06 DIAGNOSIS — R4182 Altered mental status, unspecified: Secondary | ICD-10-CM | POA: Diagnosis not present

## 2020-11-06 DIAGNOSIS — R2689 Other abnormalities of gait and mobility: Secondary | ICD-10-CM | POA: Diagnosis not present

## 2020-11-06 DIAGNOSIS — E860 Dehydration: Secondary | ICD-10-CM | POA: Diagnosis not present

## 2020-11-06 DIAGNOSIS — W19XXXD Unspecified fall, subsequent encounter: Secondary | ICD-10-CM | POA: Diagnosis not present

## 2020-11-06 DIAGNOSIS — R488 Other symbolic dysfunctions: Secondary | ICD-10-CM | POA: Diagnosis not present

## 2020-11-06 LAB — COMPREHENSIVE METABOLIC PANEL
ALT: 9 U/L (ref 0–44)
AST: 20 U/L (ref 15–41)
Albumin: 3.8 g/dL (ref 3.5–5.0)
Alkaline Phosphatase: 82 U/L (ref 38–126)
Anion gap: 8 (ref 5–15)
BUN: 27 mg/dL — ABNORMAL HIGH (ref 8–23)
CO2: 21 mmol/L — ABNORMAL LOW (ref 22–32)
Calcium: 9.7 mg/dL (ref 8.9–10.3)
Chloride: 113 mmol/L — ABNORMAL HIGH (ref 98–111)
Creatinine, Ser: 2.25 mg/dL — ABNORMAL HIGH (ref 0.44–1.00)
GFR, Estimated: 22 mL/min — ABNORMAL LOW (ref >60)
Glucose, Bld: 105 mg/dL — ABNORMAL HIGH (ref 70–99)
Potassium: 4.4 mmol/L (ref 3.5–5.1)
Sodium: 142 mmol/L (ref 135–145)
Total Bilirubin: 0.9 mg/dL (ref 0.3–1.2)
Total Protein: 7.3 g/dL (ref 6.5–8.1)

## 2020-11-06 LAB — CBC WITH DIFFERENTIAL/PLATELET
Abs Immature Granulocytes: 0.02 10*3/uL (ref 0.00–0.07)
Basophils Absolute: 0.1 10*3/uL (ref 0.0–0.1)
Basophils Relative: 1 %
Eosinophils Absolute: 0.1 10*3/uL (ref 0.0–0.5)
Eosinophils Relative: 1 %
HCT: 36.1 % (ref 36.0–46.0)
Hemoglobin: 11.5 g/dL — ABNORMAL LOW (ref 12.0–15.0)
Immature Granulocytes: 0 %
Lymphocytes Relative: 17 %
Lymphs Abs: 1.6 10*3/uL (ref 0.7–4.0)
MCH: 29.7 pg (ref 26.0–34.0)
MCHC: 31.9 g/dL (ref 30.0–36.0)
MCV: 93.3 fL (ref 80.0–100.0)
Monocytes Absolute: 1 10*3/uL (ref 0.1–1.0)
Monocytes Relative: 11 %
Neutro Abs: 6.5 10*3/uL (ref 1.7–7.7)
Neutrophils Relative %: 70 %
Platelets: 209 10*3/uL (ref 150–400)
RBC: 3.87 MIL/uL (ref 3.87–5.11)
RDW: 14.3 % (ref 11.5–15.5)
WBC: 9.2 10*3/uL (ref 4.0–10.5)
nRBC: 0 % (ref 0.0–0.2)

## 2020-11-06 LAB — URINALYSIS, COMPLETE (UACMP) WITH MICROSCOPIC
Bacteria, UA: NONE SEEN
Bilirubin Urine: NEGATIVE
Glucose, UA: NEGATIVE mg/dL
Ketones, ur: 5 mg/dL — AB
Leukocytes,Ua: NEGATIVE
Nitrite: NEGATIVE
Protein, ur: NEGATIVE mg/dL
Specific Gravity, Urine: 1.009 (ref 1.005–1.030)
pH: 5 (ref 5.0–8.0)

## 2020-11-06 LAB — CBG MONITORING, ED: Glucose-Capillary: 103 mg/dL — ABNORMAL HIGH (ref 70–99)

## 2020-11-06 IMAGING — CT CT HEAD W/O CM
3 series · 15 of 47 positions shown, 18 images · non-contrast
Comparison: [DATE]

CLINICAL DATA: Mental status change

EXAM:
CT HEAD WITHOUT CONTRAST
TECHNIQUE: Contiguous axial images were obtained from the base of the skull
through the vertex without intravenous contrast.

[Series 3: head 5.0 h30s · axial · 0.44mm/px · z∈[-93,+42]mm · 9 of 33 slices shown, 12 images]
[im 3/33  brain]
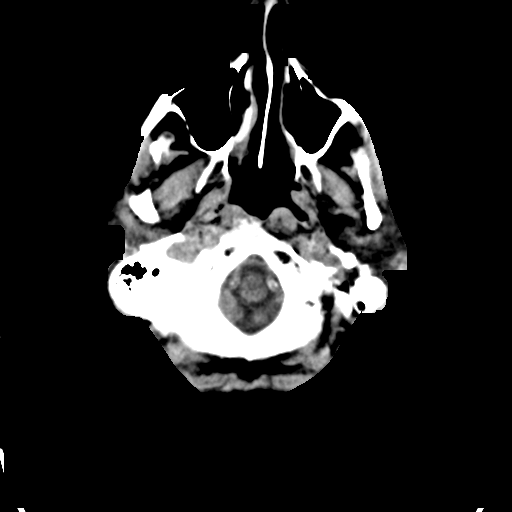
[im 3/33  bone]
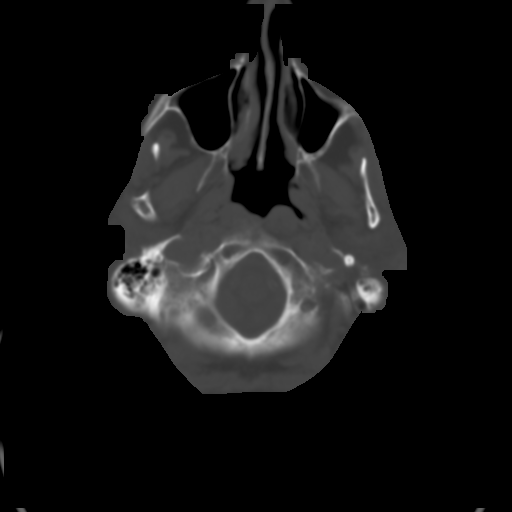
[im 6/33  brain]
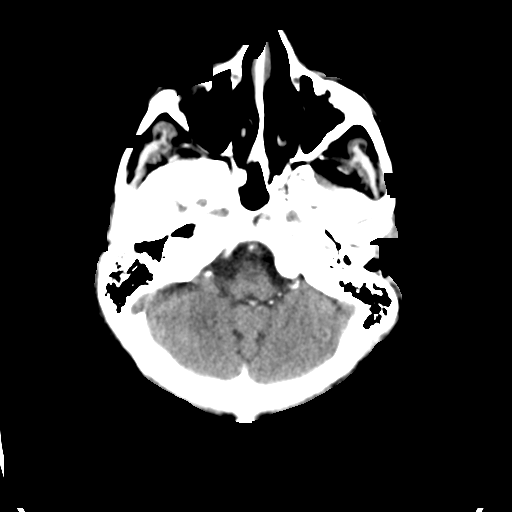
[im 9/33  brain]
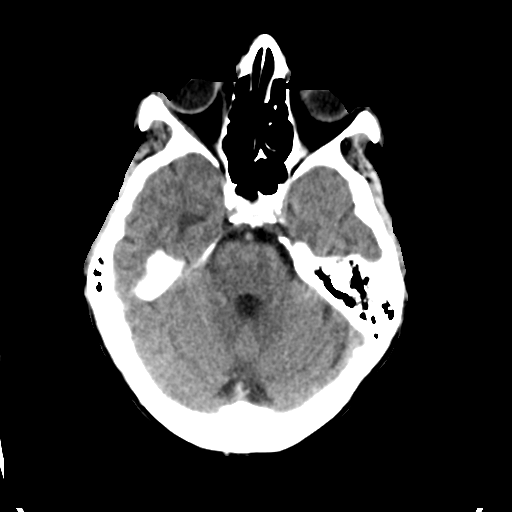
[im 13/33  brain]
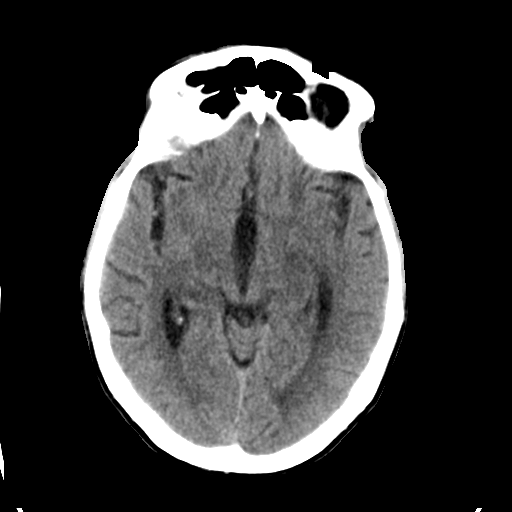
[im 17/33  brain]
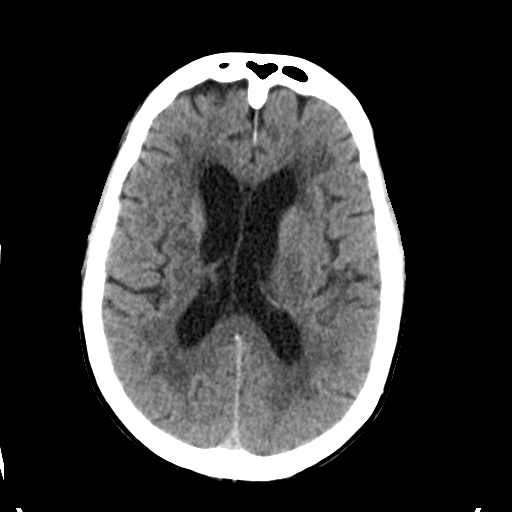
[im 17/33  bone]
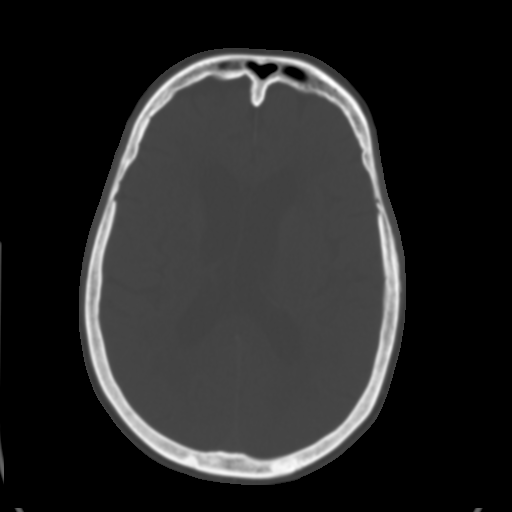
[im 20/33  brain]
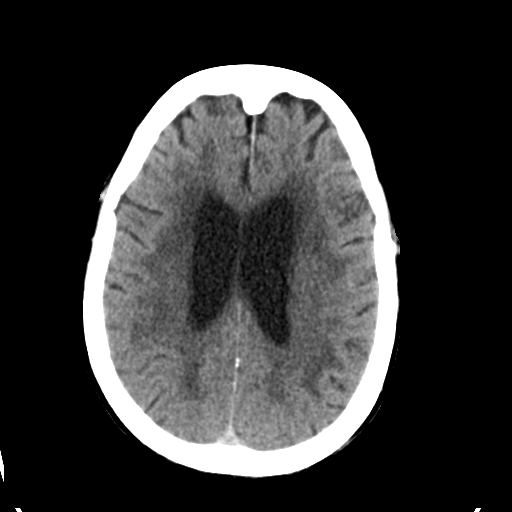
[im 24/33  brain]
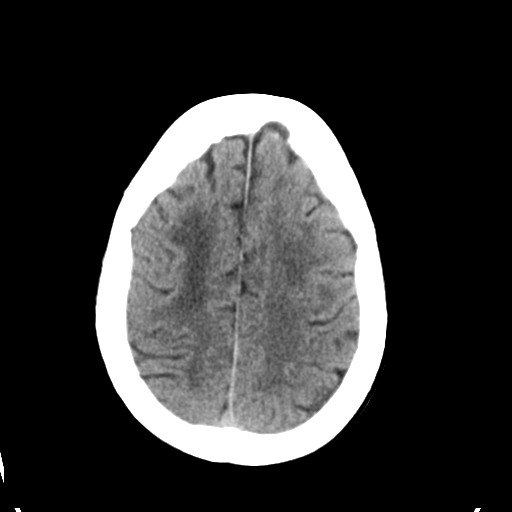
[im 27/33  brain]
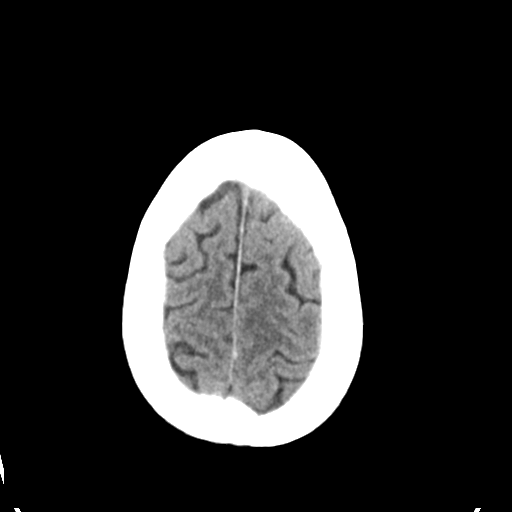
[im 30/33  brain]
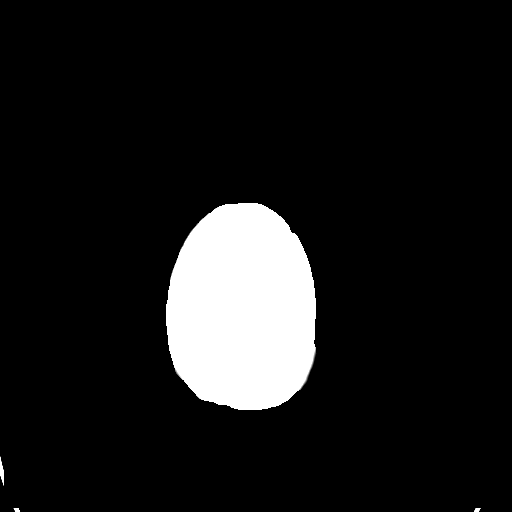
[im 30/33  bone]
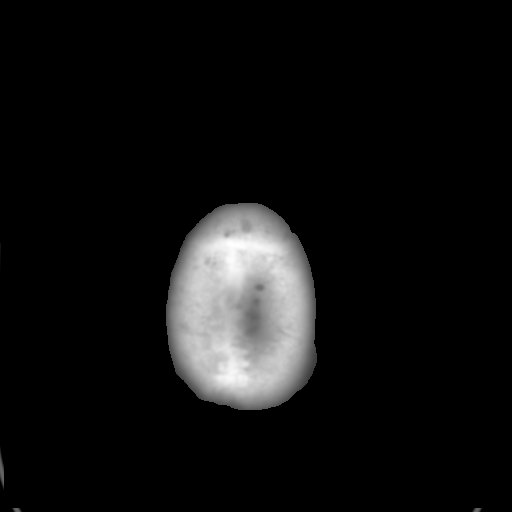

[Series 5: head 3.0 mpr cor · coronal · 0.32mm/px · 3 of 76 slices shown]
[im 26/76  brain]
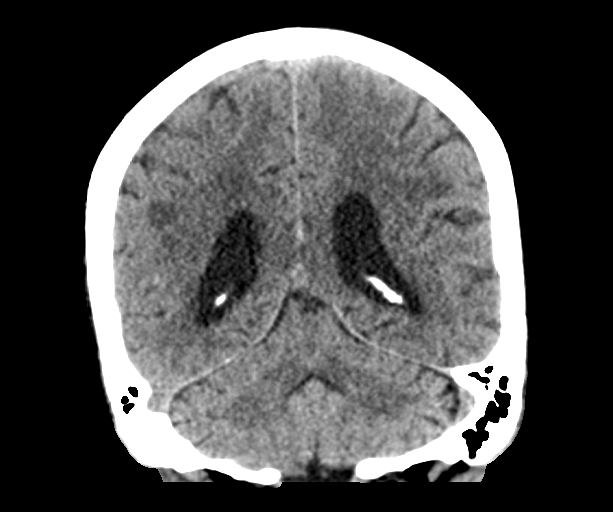
[im 34/76  brain]
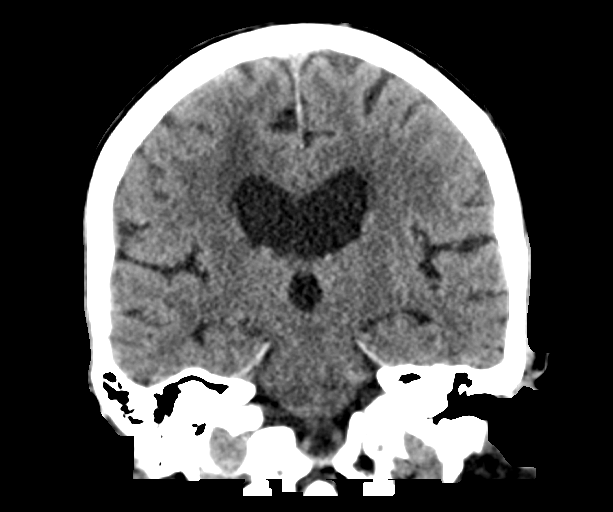
[im 42/76  brain]
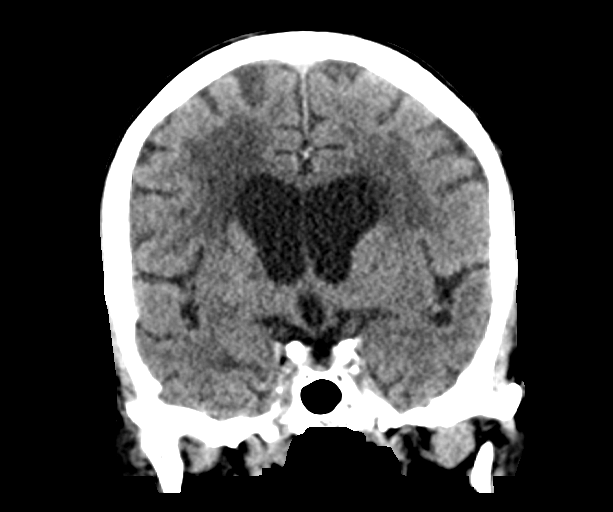

[Series 6: head 3.0 mpr sag · sagittal · 0.32mm/px · 3 of 64 slices shown]
[im 22/64  brain]
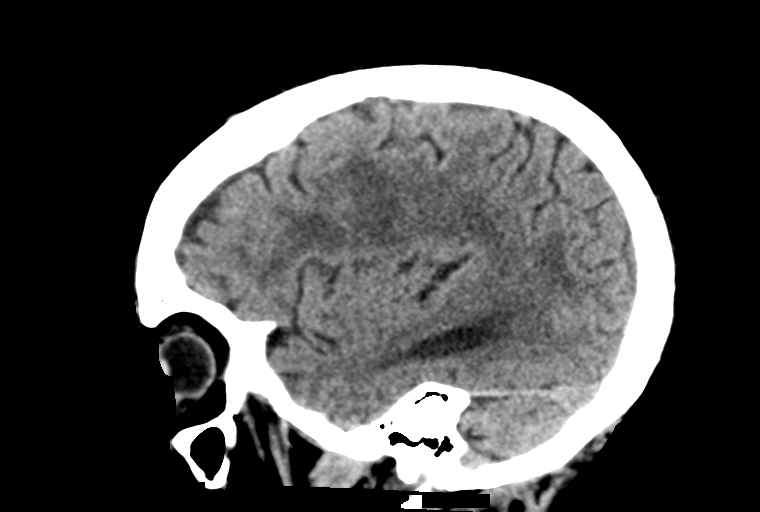
[im 32/64  brain]
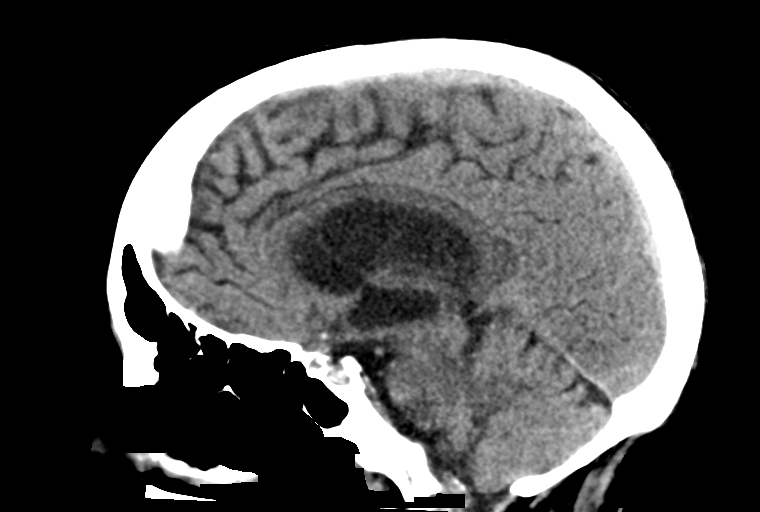
[im 43/64  brain]
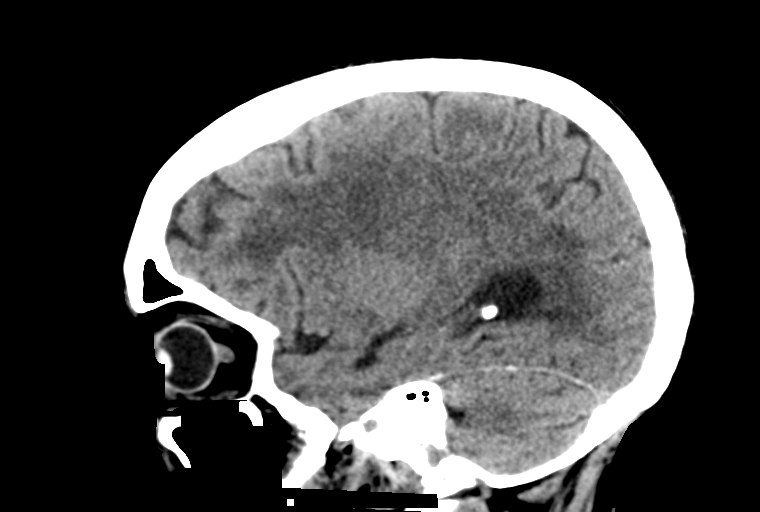

[15 of 47 positions shown; findings below may reference images not displayed]

FINDINGS: Brain: There is no acute intracranial hemorrhage, mass effect, or
edema. Gray-white differentiation is preserved. There is no
extra-axial fluid collection. Ventricles and sulci are stable in
size and configuration. Confluent areas of hypoattenuation in the
supratentorial white matter probably reflect stable chronic
microvascular ischemic changes. Chronic small vessel infarcts of the
right basal ganglia and adjacent white matter.

Vascular: There is atherosclerotic calcification at the skull base.

Skull: Calvarium is unremarkable.

Sinuses/Orbits: No acute finding. Evidence of prior sinonasal
surgery.

Other: None.
IMPRESSION: No acute intracranial abnormality or significant change since recent
prior study.

## 2020-11-06 MED ORDER — SODIUM CHLORIDE 0.9 % IV BOLUS
500.0000 mL | Freq: Once | INTRAVENOUS | Status: AC
  Administered 2020-11-06: 500 mL via INTRAVENOUS

## 2020-11-06 NOTE — ED Notes (Signed)
Unable to obtain EKG. Will attempt again when pt returns from CT.

## 2020-11-06 NOTE — ED Notes (Signed)
LVM for sister Anne Ng [phone] to let her know patient was on her way back to SNF.

## 2020-11-06 NOTE — ED Notes (Signed)
Patient transported to CT 

## 2020-11-06 NOTE — Discharge Instructions (Addendum)
We saw [name] for altered mental status. Based on physical exam findings and lab  and imaging workup, there is not reason to suspect sepsis or infectious etiology for her waxing and waning mental status.  Her urine was clear of UTI so that is not contributing. We recommend following up with her outpatient provider to evaluate these changes.

## 2020-11-06 NOTE — ED Notes (Signed)
Call sister when ptar has come to pick her up

## 2020-11-06 NOTE — ED Notes (Signed)
PTAR says she's 6th on list

## 2020-11-06 NOTE — ED Triage Notes (Signed)
Pt BIB [facility] c/o AMS from [facility]. Pt has been treated for 5 days for a UTI. Pt woke up this AM and was altered, had increased confusion and was more combative than normal. Pt is normally pleasant at baseline and is agitated today. Pt was seen by a PA at facility today for possible sepsis. Pt has hx of dementia. Pt has had an increase of falls with her UTI, fell today prior to EMS arrival with no injury reported. Pt does not take a blood thinner.   EMS VS- 130/86, HR 60, 16RR, CBG 134

## 2020-11-06 NOTE — ED Provider Notes (Signed)
[facility] EMERGENCY DEPARTMENT Provider Note   CSN: [id number] Arrival date & time: 11/06/20  1303     History Chief Complaint  Patient presents with  . Altered Mental Status    Sandra Sheppard is a 75 y.o. female.  The history is provided by the nursing home, medical records and the EMS personnel.     Patient presents with AMS. EMS reports she woke up this morning altered from her baseline. She is normally pleasant, but today she has been confused, agitated, and combative. She had a witnessed fall today. She did not hit her head or have any injuries. She has been falling more often since getting an UTI 4 days ago. She has been taking antibiotics for it.   She was seen 11/03/20 for a fall. Her workup was reassuring and left the hospital.  She has dementia, but is altered from her baseline. She tells me she has to go work [employer]ural [employer]. She states she wants a cigarette and that I better "get myself on out of here".    Level 5 caveat applies due to dementia.   Past Medical History:  Diagnosis Date  . ABDOMINAL AORTIC ANEURYSM   . Atrial fibrillation ([place])   . CAROTID STENOSIS   . CKD (chronic kidney disease), stage V ([place])   . Complication of anesthesia   . Confusion   . CVD (cardiovascular disease)   . Diabetes ([place])    no meds at present time  . FIBRILLATION, ATRIAL   . HYPERCHOLESTEROLEMIA  IIA   . HYPERTENSION, MILD   . MURMUR   . Osteoporosis   . PERIPHERAL VASCULAR DISEASE   . PONV (postoperative nausea and vomiting)   . STENOSIS, MITRAL AND AORTIC VALVES     Patient Active Problem List   Diagnosis Date Noted  . Syncope and collapse 08/17/2020  . Protein-calorie malnutrition, severe 08/17/2020  . Near syncope 08/14/2020  . CKD (chronic kidney disease) stage 4, GFR 15-29 ml/min (HCC) 08/13/2020  . Tobacco abuse 09/22/2019  . Educated about COVID-19 virus infection 09/22/2019  . Asymptomatic bilateral carotid artery stenosis 09/22/2019  . Acute  renal failure with acute tubular necrosis superimposed on stage 3 chronic kidney disease ([place])   . Palliative care encounter   . Hypocalcemia   . FTT (failure to thrive) in adult   . AKI (acute kidney injury) ([place]) 08/08/2018  . Hyperkalemia 08/08/2018  . Metabolic acidosis, increased anion gap 08/08/2018  . Normocytic anemia 08/08/2018  . Diabetes mellitus type II, non insulin dependent ([place]) 08/08/2018  . Bilateral carotid artery disease ([place]) 07/12/2018  . Dyslipidemia 07/12/2018  . Vomiting 07/04/2016  . Gastroenteritis 07/04/2016  . CKD (chronic kidney disease) stage 3, GFR 30-59 ml/min (HCC) 07/04/2016  . Occlusion and stenosis of carotid artery 04/28/2010  . ABDOMINAL AORTIC ANEURYSM 04/28/2010  . Peripheral vascular disease ([place]) 12/08/2008  . STENOSIS, MITRAL AND AORTIC VALVES 10/07/2008  . Paroxysmal atrial fibrillation ([place]) 10/07/2008  . H/O aortic valve replacement 08/14/2008  . HYPERCHOLESTEROLEMIA  IIA 06/23/2008  . HYPERTENSION, MILD 06/23/2008  . MURMUR 06/23/2008    Past Surgical History:  Procedure Laterality Date  . AORTIC VALVE REPLACEMENT     08/14/2008.  Dr. Roxy Manns  . MYRINGOTOMY WITH TUBE PLACEMENT Bilateral 03/01/2019   Procedure: MYRINGOTOMY WITH  T TUBE PLACEMENT;  Surgeon: Leta Baptist, MD;  Location: [facility];  Service: ENT;  Laterality: Bilateral;  . TOTAL ABDOMINAL HYSTERECTOMY       OB History  No obstetric history on file.     Family History  Problem Relation Age of Onset  . Colon polyps Other   . Heart disease Mother   . Heart disease Father   . Heart disease Brother     Social History   Tobacco Use  . Smoking status: Former Smoker    Packs/day: 0.25  . Smokeless tobacco: Never Used  Substance Use Topics  . Alcohol use: No  . Drug use: Never    Home Medications Prior to Admission medications   Medication Sig Start Date End Date Taking? Authorizing Provider  amLODipine (NORVASC) 5 MG tablet Take 1 tablet (5 mg  total) by mouth daily. 09/01/20 10/01/20  Manuella Ghazi, Pratik D, DO  aspirin 81 MG tablet Take 81 mg by mouth daily.    [provider]  Cholecalciferol (VITAMIN D-3) 1000 UNITS CAPS Take 2,000 Units by mouth daily.     [provider]  docusate sodium (COLACE) 100 MG capsule Take 1 capsule (100 mg total) by mouth 2 (two) times daily. 08/31/18   Hongalgi, Lenis Dickinson, MD  escitalopram (LEXAPRO) 5 MG tablet Take 5 mg by mouth daily. 08/07/20   [provider]  feeding supplement (ENSURE ENLIVE / ENSURE PLUS) LIQD Take 237 mLs by mouth 3 (three) times daily between meals. 09/01/20   Manuella Ghazi, Pratik D, DO  loratadine (CLARITIN) 10 MG tablet Take 10 mg by mouth daily as needed for allergies.    [provider]  Multiple Vitamin (MULTIVITAMIN) capsule Take 1 capsule by mouth daily.    [provider]  nicotine (NICODERM CQ - DOSED IN MG/24 HOURS) 21 mg/24hr patch Place 1 patch (21 mg total) onto the skin daily. 09/01/20   Manuella Ghazi, Pratik D, DO  pravastatin (PRAVACHOL) 20 MG tablet Take 20 mg by mouth daily. 08/04/20   [provider]  QUEtiapine (SEROQUEL) 25 MG tablet Take 1 tablet (25 mg total) by mouth at bedtime. 09/01/20   Manuella Ghazi, Pratik D, DO  senna-docusate (SENOKOT-S) 8.6-50 MG tablet Take 1 tablet by mouth at bedtime. 09/01/20   Manuella Ghazi, Pratik D, DO  vitamin B-12 1000 MCG tablet Take 1 tablet (1,000 mcg total) by mouth daily. 08/31/18   Hongalgi, Lenis Dickinson, MD  zoledronic acid (RECLAST) 5 MG/100ML SOLN injection Inject 5 mg into the vein See admin instructions. Once a year    [provider]    Allergies    Ativan [lorazepam], Codeine, Sulfonamide derivatives, and Penicillins  Review of Systems   Review of Systems  Unable to perform ROS: Dementia  Psychiatric/Behavioral: Positive for agitation.    Physical Exam Updated Vital Signs BP 135/84   Pulse 88   Temp 98.2 F (36.8 C) (Oral)   Resp (!) 23   Ht 5' 5.5" (1.664 m)   Wt 48 kg   SpO2 99%   BMI  17.34 kg/m   Physical Exam Vitals and nursing note reviewed. Exam conducted with a chaperone present.  Constitutional:      General: She is not in acute distress.    Appearance: Normal appearance.  HENT:     Head: Normocephalic and atraumatic.  Eyes:     General: No scleral icterus.       Right eye: No discharge.        Left eye: No discharge.     Extraocular Movements: Extraocular movements intact.     Pupils: Pupils are equal, round, and reactive to light.  Cardiovascular:     Rate and Rhythm: Normal  rate and regular rhythm.     Pulses: Normal pulses.     Heart sounds: Murmur heard.  No friction rub. No gallop.   Pulmonary:     Effort: Pulmonary effort is normal. No respiratory distress.     Breath sounds: Normal breath sounds.  Abdominal:     General: Abdomen is flat. Bowel sounds are normal. There is no distension.     Palpations: Abdomen is soft.     Tenderness: There is abdominal tenderness.     Comments: Mild pelvic tenderness bilaterally. Unclear if this is true tenderness or agitation and being pressed on.   Musculoskeletal:        General: Normal range of motion.  Skin:    General: Skin is warm and dry.     Coloration: Skin is not jaundiced.  Neurological:     Mental Status: She is alert. Mental status is at baseline.     Coordination: Coordination normal.  Psychiatric:        Attention and Perception: She is inattentive.        Mood and Affect: Affect is angry.        Speech: Speech normal.        Behavior: Behavior is uncooperative and agitated.     ED Results / Procedures / Treatments   Labs (all labs ordered are listed, but only abnormal results are displayed) Labs Reviewed  COMPREHENSIVE METABOLIC PANEL - Abnormal; Notable for the following components:      Result Value   Chloride 113 (*)    CO2 21 (*)    Glucose, Bld 105 (*)    BUN 27 (*)    Creatinine, Ser 2.25 (*)    GFR, Estimated 22 (*)    All other components within normal limits  CBC  WITH DIFFERENTIAL/PLATELET - Abnormal; Notable for the following components:   Hemoglobin 11.5 (*)    All other components within normal limits  CBG MONITORING, ED - Abnormal; Notable for the following components:   Glucose-Capillary 103 (*)    All other components within normal limits  URINE CULTURE  URINALYSIS, COMPLETE (UACMP) WITH MICROSCOPIC    EKG None  Radiology CT Head Wo Contrast  Result Date: 11/06/2020 CLINICAL DATA:  Mental status change EXAM: CT HEAD WITHOUT CONTRAST TECHNIQUE: Contiguous axial images were obtained from the base of the skull through the vertex without intravenous contrast. COMPARISON:  11/03/2020 FINDINGS: Brain: There is no acute intracranial hemorrhage, mass effect, or edema. Gray-white differentiation is preserved. There is no extra-axial fluid collection. Ventricles and sulci are stable in size and configuration. Confluent areas of hypoattenuation in the supratentorial white matter probably reflect stable chronic microvascular ischemic changes. 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 Vascular: There is atherosclerotic calcification at the skull base. Skull: Calvarium is unremarkable. Sinuses/Orbits: No acute finding. Evidence of prior sinonasal surgery. Other: None. IMPRESSION: No acute intracranial abnormality or significant change since recent prior study. Electronically Signed   By: Macy Mis M.D.   On: 11/06/2020 15:08    Procedures Procedures   Medications Ordered in ED Medications  sodium chloride 0.9 % bolus 500 mL (0 mLs Intravenous Stopped 11/06/20 1631)    ED Course  I have reviewed the triage vital signs and the nursing notes.  Pertinent labs & imaging results that were available during my care of the patient were reviewed by me and considered in my medical decision making (see chart for details).  Clinical Course as of  11/06/20 1633  Fri Nov 06, 2020  1513 Hemoglobin(!): 11.5 [HS]  1527 CT  Head Wo Contrast No signs of acute bleed or trauma to skull.  [HS]  1631 Creatinine(!): 2.25 Cr is high but at baseline [HS]  1632 BUN(!): 27 BUN high but at baseline [HS]  1632 CO2(!): 21 [HS]  1632 Chloride(!): 113 [HS]    Clinical Course User Index [HS] Sherrill Raring, PA-C   MDM Rules/Calculators/A&P                          Patient with h/o dementia and UTI presents for AMS. Her vitals are stable, and she is nontoxic appearing, However, she is obviously agitated and combative which is far from her normal baseline. She fell earlier today and is altered, so ordered head ct and basic labs. Will recheck and re-culture urine.   CBC without signs of elevated white count concerning for infection. Hgb moderately lowered to 11.5 but no significant anemia concerning for GIB.   She does not meet septic criteria. Vitals are great. No white count. CT without findings concerning for head trauma.   Urine resulted without signs of UTI. Labs and imaging do not show signs of infectious or emergent cause of altered mental status. She should follow up with her outpatient PCP for waxing and waning change to mental status. At this time she is appropriate for discharge.   Discussed HPI, physical exam and plan of care for this patient with attending Dewaine Conger. The attending physician evaluated this patient as part of a shared visit and agrees with plan of care.  Final Clinical Impression(s) / ED Diagnoses Final diagnoses:  None    Rx / DC Orders ED Discharge Orders    None       Sherrill Raring, Hershal Coria 11/06/20 1732    Breck Coons, MD 11/12/20 2320

## 2020-11-06 NOTE — ED Notes (Signed)
Pt given [place] sandwich bag. Pt eating sandwich in bed at this time.

## 2020-11-07 DIAGNOSIS — E119 Type 2 diabetes mellitus without complications: Secondary | ICD-10-CM | POA: Diagnosis not present

## 2020-11-07 DIAGNOSIS — R278 Other lack of coordination: Secondary | ICD-10-CM | POA: Diagnosis not present

## 2020-11-07 DIAGNOSIS — I679 Cerebrovascular disease, unspecified: Secondary | ICD-10-CM | POA: Diagnosis not present

## 2020-11-07 DIAGNOSIS — R55 Syncope and collapse: Secondary | ICD-10-CM | POA: Diagnosis not present

## 2020-11-07 DIAGNOSIS — R296 Repeated falls: Secondary | ICD-10-CM | POA: Diagnosis not present

## 2020-11-07 DIAGNOSIS — R2689 Other abnormalities of gait and mobility: Secondary | ICD-10-CM | POA: Diagnosis not present

## 2020-11-07 DIAGNOSIS — N184 Chronic kidney disease, stage 4 (severe): Secondary | ICD-10-CM | POA: Diagnosis not present

## 2020-11-07 DIAGNOSIS — R488 Other symbolic dysfunctions: Secondary | ICD-10-CM | POA: Diagnosis not present

## 2020-11-07 DIAGNOSIS — R2681 Unsteadiness on feet: Secondary | ICD-10-CM | POA: Diagnosis not present

## 2020-11-07 DIAGNOSIS — M6281 Muscle weakness (generalized): Secondary | ICD-10-CM | POA: Diagnosis not present

## 2020-11-08 LAB — URINE CULTURE: Culture: NO GROWTH

## 2020-11-10 DIAGNOSIS — R278 Other lack of coordination: Secondary | ICD-10-CM | POA: Diagnosis not present

## 2020-11-10 DIAGNOSIS — R55 Syncope and collapse: Secondary | ICD-10-CM | POA: Diagnosis not present

## 2020-11-10 DIAGNOSIS — M6281 Muscle weakness (generalized): Secondary | ICD-10-CM | POA: Diagnosis not present

## 2020-11-10 DIAGNOSIS — E119 Type 2 diabetes mellitus without complications: Secondary | ICD-10-CM | POA: Diagnosis not present

## 2020-11-10 DIAGNOSIS — R296 Repeated falls: Secondary | ICD-10-CM | POA: Diagnosis not present

## 2020-11-10 DIAGNOSIS — I679 Cerebrovascular disease, unspecified: Secondary | ICD-10-CM | POA: Diagnosis not present

## 2020-11-10 DIAGNOSIS — N184 Chronic kidney disease, stage 4 (severe): Secondary | ICD-10-CM | POA: Diagnosis not present

## 2020-11-10 DIAGNOSIS — R488 Other symbolic dysfunctions: Secondary | ICD-10-CM | POA: Diagnosis not present

## 2020-11-10 DIAGNOSIS — R2689 Other abnormalities of gait and mobility: Secondary | ICD-10-CM | POA: Diagnosis not present

## 2020-11-10 DIAGNOSIS — R2681 Unsteadiness on feet: Secondary | ICD-10-CM | POA: Diagnosis not present

## 2020-11-11 DIAGNOSIS — R488 Other symbolic dysfunctions: Secondary | ICD-10-CM | POA: Diagnosis not present

## 2020-11-11 DIAGNOSIS — I679 Cerebrovascular disease, unspecified: Secondary | ICD-10-CM | POA: Diagnosis not present

## 2020-11-11 DIAGNOSIS — E119 Type 2 diabetes mellitus without complications: Secondary | ICD-10-CM | POA: Diagnosis not present

## 2020-11-11 DIAGNOSIS — M6281 Muscle weakness (generalized): Secondary | ICD-10-CM | POA: Diagnosis not present

## 2020-11-11 DIAGNOSIS — R55 Syncope and collapse: Secondary | ICD-10-CM | POA: Diagnosis not present

## 2020-11-11 DIAGNOSIS — R2681 Unsteadiness on feet: Secondary | ICD-10-CM | POA: Diagnosis not present

## 2020-11-11 DIAGNOSIS — R278 Other lack of coordination: Secondary | ICD-10-CM | POA: Diagnosis not present

## 2020-11-11 DIAGNOSIS — R2689 Other abnormalities of gait and mobility: Secondary | ICD-10-CM | POA: Diagnosis not present

## 2020-11-11 DIAGNOSIS — R296 Repeated falls: Secondary | ICD-10-CM | POA: Diagnosis not present

## 2020-11-11 DIAGNOSIS — N184 Chronic kidney disease, stage 4 (severe): Secondary | ICD-10-CM | POA: Diagnosis not present

## 2020-11-12 DIAGNOSIS — R278 Other lack of coordination: Secondary | ICD-10-CM | POA: Diagnosis not present

## 2020-11-12 DIAGNOSIS — E119 Type 2 diabetes mellitus without complications: Secondary | ICD-10-CM | POA: Diagnosis not present

## 2020-11-12 DIAGNOSIS — M6281 Muscle weakness (generalized): Secondary | ICD-10-CM | POA: Diagnosis not present

## 2020-11-12 DIAGNOSIS — N184 Chronic kidney disease, stage 4 (severe): Secondary | ICD-10-CM | POA: Diagnosis not present

## 2020-11-12 DIAGNOSIS — R296 Repeated falls: Secondary | ICD-10-CM | POA: Diagnosis not present

## 2020-11-12 DIAGNOSIS — R2681 Unsteadiness on feet: Secondary | ICD-10-CM | POA: Diagnosis not present

## 2020-11-12 DIAGNOSIS — R55 Syncope and collapse: Secondary | ICD-10-CM | POA: Diagnosis not present

## 2020-11-12 DIAGNOSIS — I679 Cerebrovascular disease, unspecified: Secondary | ICD-10-CM | POA: Diagnosis not present

## 2020-11-12 DIAGNOSIS — R2689 Other abnormalities of gait and mobility: Secondary | ICD-10-CM | POA: Diagnosis not present

## 2020-11-12 DIAGNOSIS — R488 Other symbolic dysfunctions: Secondary | ICD-10-CM | POA: Diagnosis not present

## 2020-11-13 DIAGNOSIS — R488 Other symbolic dysfunctions: Secondary | ICD-10-CM | POA: Diagnosis not present

## 2020-11-13 DIAGNOSIS — R296 Repeated falls: Secondary | ICD-10-CM | POA: Diagnosis not present

## 2020-11-13 DIAGNOSIS — E119 Type 2 diabetes mellitus without complications: Secondary | ICD-10-CM | POA: Diagnosis not present

## 2020-11-13 DIAGNOSIS — R278 Other lack of coordination: Secondary | ICD-10-CM | POA: Diagnosis not present

## 2020-11-13 DIAGNOSIS — R2689 Other abnormalities of gait and mobility: Secondary | ICD-10-CM | POA: Diagnosis not present

## 2020-11-13 DIAGNOSIS — R2681 Unsteadiness on feet: Secondary | ICD-10-CM | POA: Diagnosis not present

## 2020-11-13 DIAGNOSIS — M6281 Muscle weakness (generalized): Secondary | ICD-10-CM | POA: Diagnosis not present

## 2020-11-13 DIAGNOSIS — I679 Cerebrovascular disease, unspecified: Secondary | ICD-10-CM | POA: Diagnosis not present

## 2020-11-13 DIAGNOSIS — N184 Chronic kidney disease, stage 4 (severe): Secondary | ICD-10-CM | POA: Diagnosis not present

## 2020-11-13 DIAGNOSIS — R55 Syncope and collapse: Secondary | ICD-10-CM | POA: Diagnosis not present

## 2020-11-14 DIAGNOSIS — I679 Cerebrovascular disease, unspecified: Secondary | ICD-10-CM | POA: Diagnosis not present

## 2020-11-14 DIAGNOSIS — R278 Other lack of coordination: Secondary | ICD-10-CM | POA: Diagnosis not present

## 2020-11-14 DIAGNOSIS — R296 Repeated falls: Secondary | ICD-10-CM | POA: Diagnosis not present

## 2020-11-14 DIAGNOSIS — E119 Type 2 diabetes mellitus without complications: Secondary | ICD-10-CM | POA: Diagnosis not present

## 2020-11-14 DIAGNOSIS — N184 Chronic kidney disease, stage 4 (severe): Secondary | ICD-10-CM | POA: Diagnosis not present

## 2020-11-14 DIAGNOSIS — R2681 Unsteadiness on feet: Secondary | ICD-10-CM | POA: Diagnosis not present

## 2020-11-14 DIAGNOSIS — R55 Syncope and collapse: Secondary | ICD-10-CM | POA: Diagnosis not present

## 2020-11-14 DIAGNOSIS — R488 Other symbolic dysfunctions: Secondary | ICD-10-CM | POA: Diagnosis not present

## 2020-11-14 DIAGNOSIS — R2689 Other abnormalities of gait and mobility: Secondary | ICD-10-CM | POA: Diagnosis not present

## 2020-11-14 DIAGNOSIS — M6281 Muscle weakness (generalized): Secondary | ICD-10-CM | POA: Diagnosis not present

## 2020-11-16 DIAGNOSIS — N184 Chronic kidney disease, stage 4 (severe): Secondary | ICD-10-CM | POA: Diagnosis not present

## 2020-11-16 DIAGNOSIS — R2681 Unsteadiness on feet: Secondary | ICD-10-CM | POA: Diagnosis not present

## 2020-11-16 DIAGNOSIS — R296 Repeated falls: Secondary | ICD-10-CM | POA: Diagnosis not present

## 2020-11-16 DIAGNOSIS — I679 Cerebrovascular disease, unspecified: Secondary | ICD-10-CM | POA: Diagnosis not present

## 2020-11-16 DIAGNOSIS — M6281 Muscle weakness (generalized): Secondary | ICD-10-CM | POA: Diagnosis not present

## 2020-11-16 DIAGNOSIS — E119 Type 2 diabetes mellitus without complications: Secondary | ICD-10-CM | POA: Diagnosis not present

## 2020-11-16 DIAGNOSIS — R278 Other lack of coordination: Secondary | ICD-10-CM | POA: Diagnosis not present

## 2020-11-16 DIAGNOSIS — R488 Other symbolic dysfunctions: Secondary | ICD-10-CM | POA: Diagnosis not present

## 2020-11-16 DIAGNOSIS — R55 Syncope and collapse: Secondary | ICD-10-CM | POA: Diagnosis not present

## 2020-11-16 DIAGNOSIS — R2689 Other abnormalities of gait and mobility: Secondary | ICD-10-CM | POA: Diagnosis not present

## 2020-11-17 DIAGNOSIS — R488 Other symbolic dysfunctions: Secondary | ICD-10-CM | POA: Diagnosis not present

## 2020-11-17 DIAGNOSIS — R69 Illness, unspecified: Secondary | ICD-10-CM | POA: Diagnosis not present

## 2020-11-17 DIAGNOSIS — R296 Repeated falls: Secondary | ICD-10-CM | POA: Diagnosis not present

## 2020-11-17 DIAGNOSIS — N184 Chronic kidney disease, stage 4 (severe): Secondary | ICD-10-CM | POA: Diagnosis not present

## 2020-11-17 DIAGNOSIS — R2689 Other abnormalities of gait and mobility: Secondary | ICD-10-CM | POA: Diagnosis not present

## 2020-11-17 DIAGNOSIS — F0390 Unspecified dementia without behavioral disturbance: Secondary | ICD-10-CM | POA: Diagnosis not present

## 2020-11-17 DIAGNOSIS — E119 Type 2 diabetes mellitus without complications: Secondary | ICD-10-CM | POA: Diagnosis not present

## 2020-11-17 DIAGNOSIS — R55 Syncope and collapse: Secondary | ICD-10-CM | POA: Diagnosis not present

## 2020-11-17 DIAGNOSIS — R2681 Unsteadiness on feet: Secondary | ICD-10-CM | POA: Diagnosis not present

## 2020-11-17 DIAGNOSIS — R278 Other lack of coordination: Secondary | ICD-10-CM | POA: Diagnosis not present

## 2020-11-17 DIAGNOSIS — I679 Cerebrovascular disease, unspecified: Secondary | ICD-10-CM | POA: Diagnosis not present

## 2020-11-17 DIAGNOSIS — M6281 Muscle weakness (generalized): Secondary | ICD-10-CM | POA: Diagnosis not present

## 2020-11-18 DIAGNOSIS — N184 Chronic kidney disease, stage 4 (severe): Secondary | ICD-10-CM | POA: Diagnosis not present

## 2020-11-18 DIAGNOSIS — I679 Cerebrovascular disease, unspecified: Secondary | ICD-10-CM | POA: Diagnosis not present

## 2020-11-18 DIAGNOSIS — R278 Other lack of coordination: Secondary | ICD-10-CM | POA: Diagnosis not present

## 2020-11-18 DIAGNOSIS — R488 Other symbolic dysfunctions: Secondary | ICD-10-CM | POA: Diagnosis not present

## 2020-11-18 DIAGNOSIS — E119 Type 2 diabetes mellitus without complications: Secondary | ICD-10-CM | POA: Diagnosis not present

## 2020-11-18 DIAGNOSIS — R296 Repeated falls: Secondary | ICD-10-CM | POA: Diagnosis not present

## 2020-11-18 DIAGNOSIS — M6281 Muscle weakness (generalized): Secondary | ICD-10-CM | POA: Diagnosis not present

## 2020-11-18 DIAGNOSIS — R55 Syncope and collapse: Secondary | ICD-10-CM | POA: Diagnosis not present

## 2020-11-18 DIAGNOSIS — R2681 Unsteadiness on feet: Secondary | ICD-10-CM | POA: Diagnosis not present

## 2020-11-18 DIAGNOSIS — R2689 Other abnormalities of gait and mobility: Secondary | ICD-10-CM | POA: Diagnosis not present

## 2020-11-19 DIAGNOSIS — I679 Cerebrovascular disease, unspecified: Secondary | ICD-10-CM | POA: Diagnosis not present

## 2020-11-19 DIAGNOSIS — R296 Repeated falls: Secondary | ICD-10-CM | POA: Diagnosis not present

## 2020-11-19 DIAGNOSIS — E119 Type 2 diabetes mellitus without complications: Secondary | ICD-10-CM | POA: Diagnosis not present

## 2020-11-19 DIAGNOSIS — R278 Other lack of coordination: Secondary | ICD-10-CM | POA: Diagnosis not present

## 2020-11-19 DIAGNOSIS — R55 Syncope and collapse: Secondary | ICD-10-CM | POA: Diagnosis not present

## 2020-11-19 DIAGNOSIS — R2681 Unsteadiness on feet: Secondary | ICD-10-CM | POA: Diagnosis not present

## 2020-11-19 DIAGNOSIS — M6281 Muscle weakness (generalized): Secondary | ICD-10-CM | POA: Diagnosis not present

## 2020-11-19 DIAGNOSIS — R488 Other symbolic dysfunctions: Secondary | ICD-10-CM | POA: Diagnosis not present

## 2020-11-19 DIAGNOSIS — N184 Chronic kidney disease, stage 4 (severe): Secondary | ICD-10-CM | POA: Diagnosis not present

## 2020-11-19 DIAGNOSIS — R2689 Other abnormalities of gait and mobility: Secondary | ICD-10-CM | POA: Diagnosis not present

## 2020-11-20 DIAGNOSIS — I679 Cerebrovascular disease, unspecified: Secondary | ICD-10-CM | POA: Diagnosis not present

## 2020-11-20 DIAGNOSIS — R488 Other symbolic dysfunctions: Secondary | ICD-10-CM | POA: Diagnosis not present

## 2020-11-20 DIAGNOSIS — R278 Other lack of coordination: Secondary | ICD-10-CM | POA: Diagnosis not present

## 2020-11-20 DIAGNOSIS — M6281 Muscle weakness (generalized): Secondary | ICD-10-CM | POA: Diagnosis not present

## 2020-11-20 DIAGNOSIS — R2681 Unsteadiness on feet: Secondary | ICD-10-CM | POA: Diagnosis not present

## 2020-11-20 DIAGNOSIS — R296 Repeated falls: Secondary | ICD-10-CM | POA: Diagnosis not present

## 2020-11-20 DIAGNOSIS — N184 Chronic kidney disease, stage 4 (severe): Secondary | ICD-10-CM | POA: Diagnosis not present

## 2020-11-20 DIAGNOSIS — E119 Type 2 diabetes mellitus without complications: Secondary | ICD-10-CM | POA: Diagnosis not present

## 2020-11-20 DIAGNOSIS — R55 Syncope and collapse: Secondary | ICD-10-CM | POA: Diagnosis not present

## 2020-11-20 DIAGNOSIS — R2689 Other abnormalities of gait and mobility: Secondary | ICD-10-CM | POA: Diagnosis not present

## 2020-11-21 DIAGNOSIS — R296 Repeated falls: Secondary | ICD-10-CM | POA: Diagnosis not present

## 2020-11-21 DIAGNOSIS — I679 Cerebrovascular disease, unspecified: Secondary | ICD-10-CM | POA: Diagnosis not present

## 2020-11-21 DIAGNOSIS — E119 Type 2 diabetes mellitus without complications: Secondary | ICD-10-CM | POA: Diagnosis not present

## 2020-11-21 DIAGNOSIS — R55 Syncope and collapse: Secondary | ICD-10-CM | POA: Diagnosis not present

## 2020-11-21 DIAGNOSIS — R2689 Other abnormalities of gait and mobility: Secondary | ICD-10-CM | POA: Diagnosis not present

## 2020-11-21 DIAGNOSIS — R278 Other lack of coordination: Secondary | ICD-10-CM | POA: Diagnosis not present

## 2020-11-21 DIAGNOSIS — N184 Chronic kidney disease, stage 4 (severe): Secondary | ICD-10-CM | POA: Diagnosis not present

## 2020-11-21 DIAGNOSIS — R2681 Unsteadiness on feet: Secondary | ICD-10-CM | POA: Diagnosis not present

## 2020-11-21 DIAGNOSIS — M6281 Muscle weakness (generalized): Secondary | ICD-10-CM | POA: Diagnosis not present

## 2020-11-21 DIAGNOSIS — R488 Other symbolic dysfunctions: Secondary | ICD-10-CM | POA: Diagnosis not present

## 2020-11-23 DIAGNOSIS — R488 Other symbolic dysfunctions: Secondary | ICD-10-CM | POA: Diagnosis not present

## 2020-11-23 DIAGNOSIS — R296 Repeated falls: Secondary | ICD-10-CM | POA: Diagnosis not present

## 2020-11-23 DIAGNOSIS — R55 Syncope and collapse: Secondary | ICD-10-CM | POA: Diagnosis not present

## 2020-11-23 DIAGNOSIS — R2681 Unsteadiness on feet: Secondary | ICD-10-CM | POA: Diagnosis not present

## 2020-11-23 DIAGNOSIS — R278 Other lack of coordination: Secondary | ICD-10-CM | POA: Diagnosis not present

## 2020-11-23 DIAGNOSIS — I679 Cerebrovascular disease, unspecified: Secondary | ICD-10-CM | POA: Diagnosis not present

## 2020-11-23 DIAGNOSIS — R2689 Other abnormalities of gait and mobility: Secondary | ICD-10-CM | POA: Diagnosis not present

## 2020-11-23 DIAGNOSIS — M6281 Muscle weakness (generalized): Secondary | ICD-10-CM | POA: Diagnosis not present

## 2020-11-23 DIAGNOSIS — N184 Chronic kidney disease, stage 4 (severe): Secondary | ICD-10-CM | POA: Diagnosis not present

## 2020-11-23 DIAGNOSIS — E119 Type 2 diabetes mellitus without complications: Secondary | ICD-10-CM | POA: Diagnosis not present

## 2020-11-24 DIAGNOSIS — R55 Syncope and collapse: Secondary | ICD-10-CM | POA: Diagnosis not present

## 2020-11-24 DIAGNOSIS — R488 Other symbolic dysfunctions: Secondary | ICD-10-CM | POA: Diagnosis not present

## 2020-11-24 DIAGNOSIS — I679 Cerebrovascular disease, unspecified: Secondary | ICD-10-CM | POA: Diagnosis not present

## 2020-11-24 DIAGNOSIS — M6281 Muscle weakness (generalized): Secondary | ICD-10-CM | POA: Diagnosis not present

## 2020-11-24 DIAGNOSIS — N184 Chronic kidney disease, stage 4 (severe): Secondary | ICD-10-CM | POA: Diagnosis not present

## 2020-11-24 DIAGNOSIS — R296 Repeated falls: Secondary | ICD-10-CM | POA: Diagnosis not present

## 2020-11-24 DIAGNOSIS — R2681 Unsteadiness on feet: Secondary | ICD-10-CM | POA: Diagnosis not present

## 2020-11-24 DIAGNOSIS — R2689 Other abnormalities of gait and mobility: Secondary | ICD-10-CM | POA: Diagnosis not present

## 2020-11-24 DIAGNOSIS — R278 Other lack of coordination: Secondary | ICD-10-CM | POA: Diagnosis not present

## 2020-11-24 DIAGNOSIS — E119 Type 2 diabetes mellitus without complications: Secondary | ICD-10-CM | POA: Diagnosis not present

## 2020-11-25 DIAGNOSIS — H00014 Hordeolum externum left upper eyelid: Secondary | ICD-10-CM | POA: Diagnosis not present

## 2020-11-25 DIAGNOSIS — R69 Illness, unspecified: Secondary | ICD-10-CM | POA: Diagnosis not present

## 2020-11-25 DIAGNOSIS — I679 Cerebrovascular disease, unspecified: Secondary | ICD-10-CM | POA: Diagnosis not present

## 2020-11-25 DIAGNOSIS — R2681 Unsteadiness on feet: Secondary | ICD-10-CM | POA: Diagnosis not present

## 2020-11-25 DIAGNOSIS — R278 Other lack of coordination: Secondary | ICD-10-CM | POA: Diagnosis not present

## 2020-11-25 DIAGNOSIS — N184 Chronic kidney disease, stage 4 (severe): Secondary | ICD-10-CM | POA: Diagnosis not present

## 2020-11-25 DIAGNOSIS — I10 Essential (primary) hypertension: Secondary | ICD-10-CM | POA: Diagnosis not present

## 2020-11-25 DIAGNOSIS — R55 Syncope and collapse: Secondary | ICD-10-CM | POA: Diagnosis not present

## 2020-11-25 DIAGNOSIS — G8911 Acute pain due to trauma: Secondary | ICD-10-CM | POA: Diagnosis not present

## 2020-11-25 DIAGNOSIS — R488 Other symbolic dysfunctions: Secondary | ICD-10-CM | POA: Diagnosis not present

## 2020-11-25 DIAGNOSIS — E119 Type 2 diabetes mellitus without complications: Secondary | ICD-10-CM | POA: Diagnosis not present

## 2020-11-25 DIAGNOSIS — R2689 Other abnormalities of gait and mobility: Secondary | ICD-10-CM | POA: Diagnosis not present

## 2020-11-25 DIAGNOSIS — M6281 Muscle weakness (generalized): Secondary | ICD-10-CM | POA: Diagnosis not present

## 2020-11-25 DIAGNOSIS — R296 Repeated falls: Secondary | ICD-10-CM | POA: Diagnosis not present

## 2020-11-26 DIAGNOSIS — I679 Cerebrovascular disease, unspecified: Secondary | ICD-10-CM | POA: Diagnosis not present

## 2020-11-26 DIAGNOSIS — M6281 Muscle weakness (generalized): Secondary | ICD-10-CM | POA: Diagnosis not present

## 2020-11-26 DIAGNOSIS — R2681 Unsteadiness on feet: Secondary | ICD-10-CM | POA: Diagnosis not present

## 2020-11-26 DIAGNOSIS — R278 Other lack of coordination: Secondary | ICD-10-CM | POA: Diagnosis not present

## 2020-11-26 DIAGNOSIS — R55 Syncope and collapse: Secondary | ICD-10-CM | POA: Diagnosis not present

## 2020-11-26 DIAGNOSIS — R2689 Other abnormalities of gait and mobility: Secondary | ICD-10-CM | POA: Diagnosis not present

## 2020-11-26 DIAGNOSIS — R296 Repeated falls: Secondary | ICD-10-CM | POA: Diagnosis not present

## 2020-11-26 DIAGNOSIS — R488 Other symbolic dysfunctions: Secondary | ICD-10-CM | POA: Diagnosis not present

## 2020-11-26 DIAGNOSIS — N184 Chronic kidney disease, stage 4 (severe): Secondary | ICD-10-CM | POA: Diagnosis not present

## 2020-11-26 DIAGNOSIS — E119 Type 2 diabetes mellitus without complications: Secondary | ICD-10-CM | POA: Diagnosis not present

## 2020-11-27 DIAGNOSIS — I10 Essential (primary) hypertension: Secondary | ICD-10-CM | POA: Diagnosis not present

## 2020-11-27 DIAGNOSIS — E860 Dehydration: Secondary | ICD-10-CM | POA: Diagnosis not present

## 2020-11-27 DIAGNOSIS — R55 Syncope and collapse: Secondary | ICD-10-CM | POA: Diagnosis not present

## 2020-11-27 DIAGNOSIS — R69 Illness, unspecified: Secondary | ICD-10-CM | POA: Diagnosis not present

## 2020-11-27 DIAGNOSIS — I679 Cerebrovascular disease, unspecified: Secondary | ICD-10-CM | POA: Diagnosis not present

## 2020-11-27 DIAGNOSIS — Z9181 History of falling: Secondary | ICD-10-CM | POA: Diagnosis not present

## 2020-11-27 DIAGNOSIS — R2681 Unsteadiness on feet: Secondary | ICD-10-CM | POA: Diagnosis not present

## 2020-11-27 DIAGNOSIS — R296 Repeated falls: Secondary | ICD-10-CM | POA: Diagnosis not present

## 2020-11-27 DIAGNOSIS — M6281 Muscle weakness (generalized): Secondary | ICD-10-CM | POA: Diagnosis not present

## 2020-11-27 DIAGNOSIS — E119 Type 2 diabetes mellitus without complications: Secondary | ICD-10-CM | POA: Diagnosis not present

## 2020-11-27 DIAGNOSIS — N184 Chronic kidney disease, stage 4 (severe): Secondary | ICD-10-CM | POA: Diagnosis not present

## 2020-11-27 DIAGNOSIS — R488 Other symbolic dysfunctions: Secondary | ICD-10-CM | POA: Diagnosis not present

## 2020-11-27 DIAGNOSIS — R2689 Other abnormalities of gait and mobility: Secondary | ICD-10-CM | POA: Diagnosis not present

## 2020-11-27 DIAGNOSIS — R278 Other lack of coordination: Secondary | ICD-10-CM | POA: Diagnosis not present

## 2020-11-27 DIAGNOSIS — T148XXA Other injury of unspecified body region, initial encounter: Secondary | ICD-10-CM | POA: Diagnosis not present

## 2020-11-28 DIAGNOSIS — N184 Chronic kidney disease, stage 4 (severe): Secondary | ICD-10-CM | POA: Diagnosis not present

## 2020-11-28 DIAGNOSIS — R278 Other lack of coordination: Secondary | ICD-10-CM | POA: Diagnosis not present

## 2020-11-28 DIAGNOSIS — R296 Repeated falls: Secondary | ICD-10-CM | POA: Diagnosis not present

## 2020-11-28 DIAGNOSIS — I679 Cerebrovascular disease, unspecified: Secondary | ICD-10-CM | POA: Diagnosis not present

## 2020-11-28 DIAGNOSIS — R2681 Unsteadiness on feet: Secondary | ICD-10-CM | POA: Diagnosis not present

## 2020-11-28 DIAGNOSIS — M6281 Muscle weakness (generalized): Secondary | ICD-10-CM | POA: Diagnosis not present

## 2020-11-28 DIAGNOSIS — E119 Type 2 diabetes mellitus without complications: Secondary | ICD-10-CM | POA: Diagnosis not present

## 2020-11-28 DIAGNOSIS — R55 Syncope and collapse: Secondary | ICD-10-CM | POA: Diagnosis not present

## 2020-11-28 DIAGNOSIS — R488 Other symbolic dysfunctions: Secondary | ICD-10-CM | POA: Diagnosis not present

## 2020-11-28 DIAGNOSIS — R2689 Other abnormalities of gait and mobility: Secondary | ICD-10-CM | POA: Diagnosis not present

## 2020-11-30 DIAGNOSIS — R278 Other lack of coordination: Secondary | ICD-10-CM | POA: Diagnosis not present

## 2020-11-30 DIAGNOSIS — I679 Cerebrovascular disease, unspecified: Secondary | ICD-10-CM | POA: Diagnosis not present

## 2020-11-30 DIAGNOSIS — M6281 Muscle weakness (generalized): Secondary | ICD-10-CM | POA: Diagnosis not present

## 2020-11-30 DIAGNOSIS — N184 Chronic kidney disease, stage 4 (severe): Secondary | ICD-10-CM | POA: Diagnosis not present

## 2020-11-30 DIAGNOSIS — R2681 Unsteadiness on feet: Secondary | ICD-10-CM | POA: Diagnosis not present

## 2020-11-30 DIAGNOSIS — E119 Type 2 diabetes mellitus without complications: Secondary | ICD-10-CM | POA: Diagnosis not present

## 2020-11-30 DIAGNOSIS — R488 Other symbolic dysfunctions: Secondary | ICD-10-CM | POA: Diagnosis not present

## 2020-11-30 DIAGNOSIS — R55 Syncope and collapse: Secondary | ICD-10-CM | POA: Diagnosis not present

## 2020-11-30 DIAGNOSIS — R296 Repeated falls: Secondary | ICD-10-CM | POA: Diagnosis not present

## 2020-11-30 DIAGNOSIS — R2689 Other abnormalities of gait and mobility: Secondary | ICD-10-CM | POA: Diagnosis not present

## 2020-12-01 DIAGNOSIS — R278 Other lack of coordination: Secondary | ICD-10-CM | POA: Diagnosis not present

## 2020-12-01 DIAGNOSIS — R488 Other symbolic dysfunctions: Secondary | ICD-10-CM | POA: Diagnosis not present

## 2020-12-01 DIAGNOSIS — R2689 Other abnormalities of gait and mobility: Secondary | ICD-10-CM | POA: Diagnosis not present

## 2020-12-01 DIAGNOSIS — R296 Repeated falls: Secondary | ICD-10-CM | POA: Diagnosis not present

## 2020-12-01 DIAGNOSIS — R55 Syncope and collapse: Secondary | ICD-10-CM | POA: Diagnosis not present

## 2020-12-01 DIAGNOSIS — E119 Type 2 diabetes mellitus without complications: Secondary | ICD-10-CM | POA: Diagnosis not present

## 2020-12-01 DIAGNOSIS — I679 Cerebrovascular disease, unspecified: Secondary | ICD-10-CM | POA: Diagnosis not present

## 2020-12-01 DIAGNOSIS — R2681 Unsteadiness on feet: Secondary | ICD-10-CM | POA: Diagnosis not present

## 2020-12-01 DIAGNOSIS — M6281 Muscle weakness (generalized): Secondary | ICD-10-CM | POA: Diagnosis not present

## 2020-12-01 DIAGNOSIS — N184 Chronic kidney disease, stage 4 (severe): Secondary | ICD-10-CM | POA: Diagnosis not present

## 2020-12-02 DIAGNOSIS — E119 Type 2 diabetes mellitus without complications: Secondary | ICD-10-CM | POA: Diagnosis not present

## 2020-12-02 DIAGNOSIS — R2681 Unsteadiness on feet: Secondary | ICD-10-CM | POA: Diagnosis not present

## 2020-12-02 DIAGNOSIS — R296 Repeated falls: Secondary | ICD-10-CM | POA: Diagnosis not present

## 2020-12-02 DIAGNOSIS — M6281 Muscle weakness (generalized): Secondary | ICD-10-CM | POA: Diagnosis not present

## 2020-12-02 DIAGNOSIS — R488 Other symbolic dysfunctions: Secondary | ICD-10-CM | POA: Diagnosis not present

## 2020-12-02 DIAGNOSIS — N184 Chronic kidney disease, stage 4 (severe): Secondary | ICD-10-CM | POA: Diagnosis not present

## 2020-12-02 DIAGNOSIS — I679 Cerebrovascular disease, unspecified: Secondary | ICD-10-CM | POA: Diagnosis not present

## 2020-12-02 DIAGNOSIS — R2689 Other abnormalities of gait and mobility: Secondary | ICD-10-CM | POA: Diagnosis not present

## 2020-12-02 DIAGNOSIS — R55 Syncope and collapse: Secondary | ICD-10-CM | POA: Diagnosis not present

## 2020-12-02 DIAGNOSIS — R278 Other lack of coordination: Secondary | ICD-10-CM | POA: Diagnosis not present

## 2020-12-03 DIAGNOSIS — R296 Repeated falls: Secondary | ICD-10-CM | POA: Diagnosis not present

## 2020-12-03 DIAGNOSIS — E119 Type 2 diabetes mellitus without complications: Secondary | ICD-10-CM | POA: Diagnosis not present

## 2020-12-03 DIAGNOSIS — N184 Chronic kidney disease, stage 4 (severe): Secondary | ICD-10-CM | POA: Diagnosis not present

## 2020-12-03 DIAGNOSIS — R2689 Other abnormalities of gait and mobility: Secondary | ICD-10-CM | POA: Diagnosis not present

## 2020-12-03 DIAGNOSIS — R278 Other lack of coordination: Secondary | ICD-10-CM | POA: Diagnosis not present

## 2020-12-03 DIAGNOSIS — R55 Syncope and collapse: Secondary | ICD-10-CM | POA: Diagnosis not present

## 2020-12-03 DIAGNOSIS — R488 Other symbolic dysfunctions: Secondary | ICD-10-CM | POA: Diagnosis not present

## 2020-12-03 DIAGNOSIS — R2681 Unsteadiness on feet: Secondary | ICD-10-CM | POA: Diagnosis not present

## 2020-12-03 DIAGNOSIS — M6281 Muscle weakness (generalized): Secondary | ICD-10-CM | POA: Diagnosis not present

## 2020-12-03 DIAGNOSIS — I679 Cerebrovascular disease, unspecified: Secondary | ICD-10-CM | POA: Diagnosis not present

## 2020-12-04 DIAGNOSIS — R488 Other symbolic dysfunctions: Secondary | ICD-10-CM | POA: Diagnosis not present

## 2020-12-04 DIAGNOSIS — R55 Syncope and collapse: Secondary | ICD-10-CM | POA: Diagnosis not present

## 2020-12-04 DIAGNOSIS — R296 Repeated falls: Secondary | ICD-10-CM | POA: Diagnosis not present

## 2020-12-04 DIAGNOSIS — R2681 Unsteadiness on feet: Secondary | ICD-10-CM | POA: Diagnosis not present

## 2020-12-04 DIAGNOSIS — R2689 Other abnormalities of gait and mobility: Secondary | ICD-10-CM | POA: Diagnosis not present

## 2020-12-04 DIAGNOSIS — M6281 Muscle weakness (generalized): Secondary | ICD-10-CM | POA: Diagnosis not present

## 2020-12-04 DIAGNOSIS — N184 Chronic kidney disease, stage 4 (severe): Secondary | ICD-10-CM | POA: Diagnosis not present

## 2020-12-04 DIAGNOSIS — E119 Type 2 diabetes mellitus without complications: Secondary | ICD-10-CM | POA: Diagnosis not present

## 2020-12-04 DIAGNOSIS — R278 Other lack of coordination: Secondary | ICD-10-CM | POA: Diagnosis not present

## 2020-12-04 DIAGNOSIS — I679 Cerebrovascular disease, unspecified: Secondary | ICD-10-CM | POA: Diagnosis not present

## 2020-12-05 DIAGNOSIS — R55 Syncope and collapse: Secondary | ICD-10-CM | POA: Diagnosis not present

## 2020-12-05 DIAGNOSIS — R296 Repeated falls: Secondary | ICD-10-CM | POA: Diagnosis not present

## 2020-12-05 DIAGNOSIS — R278 Other lack of coordination: Secondary | ICD-10-CM | POA: Diagnosis not present

## 2020-12-05 DIAGNOSIS — I679 Cerebrovascular disease, unspecified: Secondary | ICD-10-CM | POA: Diagnosis not present

## 2020-12-05 DIAGNOSIS — R2689 Other abnormalities of gait and mobility: Secondary | ICD-10-CM | POA: Diagnosis not present

## 2020-12-05 DIAGNOSIS — M6281 Muscle weakness (generalized): Secondary | ICD-10-CM | POA: Diagnosis not present

## 2020-12-05 DIAGNOSIS — E119 Type 2 diabetes mellitus without complications: Secondary | ICD-10-CM | POA: Diagnosis not present

## 2020-12-05 DIAGNOSIS — N184 Chronic kidney disease, stage 4 (severe): Secondary | ICD-10-CM | POA: Diagnosis not present

## 2020-12-05 DIAGNOSIS — R488 Other symbolic dysfunctions: Secondary | ICD-10-CM | POA: Diagnosis not present

## 2020-12-05 DIAGNOSIS — R2681 Unsteadiness on feet: Secondary | ICD-10-CM | POA: Diagnosis not present

## 2020-12-07 DIAGNOSIS — I679 Cerebrovascular disease, unspecified: Secondary | ICD-10-CM | POA: Diagnosis not present

## 2020-12-07 DIAGNOSIS — R55 Syncope and collapse: Secondary | ICD-10-CM | POA: Diagnosis not present

## 2020-12-07 DIAGNOSIS — R296 Repeated falls: Secondary | ICD-10-CM | POA: Diagnosis not present

## 2020-12-07 DIAGNOSIS — R278 Other lack of coordination: Secondary | ICD-10-CM | POA: Diagnosis not present

## 2020-12-07 DIAGNOSIS — R488 Other symbolic dysfunctions: Secondary | ICD-10-CM | POA: Diagnosis not present

## 2020-12-07 DIAGNOSIS — M6281 Muscle weakness (generalized): Secondary | ICD-10-CM | POA: Diagnosis not present

## 2020-12-07 DIAGNOSIS — E119 Type 2 diabetes mellitus without complications: Secondary | ICD-10-CM | POA: Diagnosis not present

## 2020-12-07 DIAGNOSIS — R2689 Other abnormalities of gait and mobility: Secondary | ICD-10-CM | POA: Diagnosis not present

## 2020-12-07 DIAGNOSIS — R2681 Unsteadiness on feet: Secondary | ICD-10-CM | POA: Diagnosis not present

## 2020-12-07 DIAGNOSIS — N184 Chronic kidney disease, stage 4 (severe): Secondary | ICD-10-CM | POA: Diagnosis not present

## 2020-12-08 DIAGNOSIS — R278 Other lack of coordination: Secondary | ICD-10-CM | POA: Diagnosis not present

## 2020-12-08 DIAGNOSIS — R2689 Other abnormalities of gait and mobility: Secondary | ICD-10-CM | POA: Diagnosis not present

## 2020-12-08 DIAGNOSIS — R2681 Unsteadiness on feet: Secondary | ICD-10-CM | POA: Diagnosis not present

## 2020-12-08 DIAGNOSIS — M6281 Muscle weakness (generalized): Secondary | ICD-10-CM | POA: Diagnosis not present

## 2020-12-08 DIAGNOSIS — N184 Chronic kidney disease, stage 4 (severe): Secondary | ICD-10-CM | POA: Diagnosis not present

## 2020-12-08 DIAGNOSIS — E119 Type 2 diabetes mellitus without complications: Secondary | ICD-10-CM | POA: Diagnosis not present

## 2020-12-08 DIAGNOSIS — R488 Other symbolic dysfunctions: Secondary | ICD-10-CM | POA: Diagnosis not present

## 2020-12-08 DIAGNOSIS — R55 Syncope and collapse: Secondary | ICD-10-CM | POA: Diagnosis not present

## 2020-12-08 DIAGNOSIS — I679 Cerebrovascular disease, unspecified: Secondary | ICD-10-CM | POA: Diagnosis not present

## 2020-12-08 DIAGNOSIS — R296 Repeated falls: Secondary | ICD-10-CM | POA: Diagnosis not present

## 2020-12-09 DIAGNOSIS — M6281 Muscle weakness (generalized): Secondary | ICD-10-CM | POA: Diagnosis not present

## 2020-12-09 DIAGNOSIS — R278 Other lack of coordination: Secondary | ICD-10-CM | POA: Diagnosis not present

## 2020-12-09 DIAGNOSIS — I679 Cerebrovascular disease, unspecified: Secondary | ICD-10-CM | POA: Diagnosis not present

## 2020-12-09 DIAGNOSIS — R55 Syncope and collapse: Secondary | ICD-10-CM | POA: Diagnosis not present

## 2020-12-09 DIAGNOSIS — R2681 Unsteadiness on feet: Secondary | ICD-10-CM | POA: Diagnosis not present

## 2020-12-09 DIAGNOSIS — E119 Type 2 diabetes mellitus without complications: Secondary | ICD-10-CM | POA: Diagnosis not present

## 2020-12-09 DIAGNOSIS — R2689 Other abnormalities of gait and mobility: Secondary | ICD-10-CM | POA: Diagnosis not present

## 2020-12-09 DIAGNOSIS — R488 Other symbolic dysfunctions: Secondary | ICD-10-CM | POA: Diagnosis not present

## 2020-12-09 DIAGNOSIS — N184 Chronic kidney disease, stage 4 (severe): Secondary | ICD-10-CM | POA: Diagnosis not present

## 2020-12-09 DIAGNOSIS — R296 Repeated falls: Secondary | ICD-10-CM | POA: Diagnosis not present

## 2020-12-10 DIAGNOSIS — R488 Other symbolic dysfunctions: Secondary | ICD-10-CM | POA: Diagnosis not present

## 2020-12-10 DIAGNOSIS — I679 Cerebrovascular disease, unspecified: Secondary | ICD-10-CM | POA: Diagnosis not present

## 2020-12-10 DIAGNOSIS — R2681 Unsteadiness on feet: Secondary | ICD-10-CM | POA: Diagnosis not present

## 2020-12-10 DIAGNOSIS — E119 Type 2 diabetes mellitus without complications: Secondary | ICD-10-CM | POA: Diagnosis not present

## 2020-12-10 DIAGNOSIS — R55 Syncope and collapse: Secondary | ICD-10-CM | POA: Diagnosis not present

## 2020-12-10 DIAGNOSIS — R2689 Other abnormalities of gait and mobility: Secondary | ICD-10-CM | POA: Diagnosis not present

## 2020-12-10 DIAGNOSIS — R278 Other lack of coordination: Secondary | ICD-10-CM | POA: Diagnosis not present

## 2020-12-10 DIAGNOSIS — R296 Repeated falls: Secondary | ICD-10-CM | POA: Diagnosis not present

## 2020-12-10 DIAGNOSIS — N184 Chronic kidney disease, stage 4 (severe): Secondary | ICD-10-CM | POA: Diagnosis not present

## 2020-12-10 DIAGNOSIS — M6281 Muscle weakness (generalized): Secondary | ICD-10-CM | POA: Diagnosis not present

## 2020-12-11 DIAGNOSIS — R296 Repeated falls: Secondary | ICD-10-CM | POA: Diagnosis not present

## 2020-12-11 DIAGNOSIS — R2689 Other abnormalities of gait and mobility: Secondary | ICD-10-CM | POA: Diagnosis not present

## 2020-12-11 DIAGNOSIS — N184 Chronic kidney disease, stage 4 (severe): Secondary | ICD-10-CM | POA: Diagnosis not present

## 2020-12-11 DIAGNOSIS — R278 Other lack of coordination: Secondary | ICD-10-CM | POA: Diagnosis not present

## 2020-12-11 DIAGNOSIS — M6281 Muscle weakness (generalized): Secondary | ICD-10-CM | POA: Diagnosis not present

## 2020-12-11 DIAGNOSIS — R55 Syncope and collapse: Secondary | ICD-10-CM | POA: Diagnosis not present

## 2020-12-11 DIAGNOSIS — I679 Cerebrovascular disease, unspecified: Secondary | ICD-10-CM | POA: Diagnosis not present

## 2020-12-11 DIAGNOSIS — R2681 Unsteadiness on feet: Secondary | ICD-10-CM | POA: Diagnosis not present

## 2020-12-11 DIAGNOSIS — R488 Other symbolic dysfunctions: Secondary | ICD-10-CM | POA: Diagnosis not present

## 2020-12-11 DIAGNOSIS — E119 Type 2 diabetes mellitus without complications: Secondary | ICD-10-CM | POA: Diagnosis not present

## 2020-12-12 DIAGNOSIS — R488 Other symbolic dysfunctions: Secondary | ICD-10-CM | POA: Diagnosis not present

## 2020-12-12 DIAGNOSIS — R2689 Other abnormalities of gait and mobility: Secondary | ICD-10-CM | POA: Diagnosis not present

## 2020-12-12 DIAGNOSIS — R278 Other lack of coordination: Secondary | ICD-10-CM | POA: Diagnosis not present

## 2020-12-12 DIAGNOSIS — E119 Type 2 diabetes mellitus without complications: Secondary | ICD-10-CM | POA: Diagnosis not present

## 2020-12-12 DIAGNOSIS — R55 Syncope and collapse: Secondary | ICD-10-CM | POA: Diagnosis not present

## 2020-12-12 DIAGNOSIS — N184 Chronic kidney disease, stage 4 (severe): Secondary | ICD-10-CM | POA: Diagnosis not present

## 2020-12-12 DIAGNOSIS — M6281 Muscle weakness (generalized): Secondary | ICD-10-CM | POA: Diagnosis not present

## 2020-12-12 DIAGNOSIS — R2681 Unsteadiness on feet: Secondary | ICD-10-CM | POA: Diagnosis not present

## 2020-12-12 DIAGNOSIS — I679 Cerebrovascular disease, unspecified: Secondary | ICD-10-CM | POA: Diagnosis not present

## 2020-12-12 DIAGNOSIS — R296 Repeated falls: Secondary | ICD-10-CM | POA: Diagnosis not present

## 2020-12-15 DIAGNOSIS — E119 Type 2 diabetes mellitus without complications: Secondary | ICD-10-CM | POA: Diagnosis not present

## 2020-12-15 DIAGNOSIS — N184 Chronic kidney disease, stage 4 (severe): Secondary | ICD-10-CM | POA: Diagnosis not present

## 2020-12-15 DIAGNOSIS — R278 Other lack of coordination: Secondary | ICD-10-CM | POA: Diagnosis not present

## 2020-12-15 DIAGNOSIS — R2689 Other abnormalities of gait and mobility: Secondary | ICD-10-CM | POA: Diagnosis not present

## 2020-12-15 DIAGNOSIS — I679 Cerebrovascular disease, unspecified: Secondary | ICD-10-CM | POA: Diagnosis not present

## 2020-12-15 DIAGNOSIS — R55 Syncope and collapse: Secondary | ICD-10-CM | POA: Diagnosis not present

## 2020-12-15 DIAGNOSIS — R296 Repeated falls: Secondary | ICD-10-CM | POA: Diagnosis not present

## 2020-12-15 DIAGNOSIS — R488 Other symbolic dysfunctions: Secondary | ICD-10-CM | POA: Diagnosis not present

## 2020-12-15 DIAGNOSIS — M6281 Muscle weakness (generalized): Secondary | ICD-10-CM | POA: Diagnosis not present

## 2020-12-15 DIAGNOSIS — R2681 Unsteadiness on feet: Secondary | ICD-10-CM | POA: Diagnosis not present

## 2020-12-16 DIAGNOSIS — M6281 Muscle weakness (generalized): Secondary | ICD-10-CM | POA: Diagnosis not present

## 2020-12-16 DIAGNOSIS — I679 Cerebrovascular disease, unspecified: Secondary | ICD-10-CM | POA: Diagnosis not present

## 2020-12-16 DIAGNOSIS — R296 Repeated falls: Secondary | ICD-10-CM | POA: Diagnosis not present

## 2020-12-16 DIAGNOSIS — R2689 Other abnormalities of gait and mobility: Secondary | ICD-10-CM | POA: Diagnosis not present

## 2020-12-16 DIAGNOSIS — R55 Syncope and collapse: Secondary | ICD-10-CM | POA: Diagnosis not present

## 2020-12-16 DIAGNOSIS — R2681 Unsteadiness on feet: Secondary | ICD-10-CM | POA: Diagnosis not present

## 2020-12-16 DIAGNOSIS — R278 Other lack of coordination: Secondary | ICD-10-CM | POA: Diagnosis not present

## 2020-12-16 DIAGNOSIS — R488 Other symbolic dysfunctions: Secondary | ICD-10-CM | POA: Diagnosis not present

## 2020-12-16 DIAGNOSIS — N184 Chronic kidney disease, stage 4 (severe): Secondary | ICD-10-CM | POA: Diagnosis not present

## 2020-12-16 DIAGNOSIS — E119 Type 2 diabetes mellitus without complications: Secondary | ICD-10-CM | POA: Diagnosis not present

## 2020-12-17 DIAGNOSIS — R2689 Other abnormalities of gait and mobility: Secondary | ICD-10-CM | POA: Diagnosis not present

## 2020-12-17 DIAGNOSIS — E119 Type 2 diabetes mellitus without complications: Secondary | ICD-10-CM | POA: Diagnosis not present

## 2020-12-17 DIAGNOSIS — R278 Other lack of coordination: Secondary | ICD-10-CM | POA: Diagnosis not present

## 2020-12-17 DIAGNOSIS — N184 Chronic kidney disease, stage 4 (severe): Secondary | ICD-10-CM | POA: Diagnosis not present

## 2020-12-17 DIAGNOSIS — R488 Other symbolic dysfunctions: Secondary | ICD-10-CM | POA: Diagnosis not present

## 2020-12-17 DIAGNOSIS — M6281 Muscle weakness (generalized): Secondary | ICD-10-CM | POA: Diagnosis not present

## 2020-12-17 DIAGNOSIS — R296 Repeated falls: Secondary | ICD-10-CM | POA: Diagnosis not present

## 2020-12-17 DIAGNOSIS — I679 Cerebrovascular disease, unspecified: Secondary | ICD-10-CM | POA: Diagnosis not present

## 2020-12-17 DIAGNOSIS — R55 Syncope and collapse: Secondary | ICD-10-CM | POA: Diagnosis not present

## 2020-12-17 DIAGNOSIS — R2681 Unsteadiness on feet: Secondary | ICD-10-CM | POA: Diagnosis not present

## 2020-12-18 DIAGNOSIS — R2681 Unsteadiness on feet: Secondary | ICD-10-CM | POA: Diagnosis not present

## 2020-12-18 DIAGNOSIS — R296 Repeated falls: Secondary | ICD-10-CM | POA: Diagnosis not present

## 2020-12-18 DIAGNOSIS — R2689 Other abnormalities of gait and mobility: Secondary | ICD-10-CM | POA: Diagnosis not present

## 2020-12-18 DIAGNOSIS — R278 Other lack of coordination: Secondary | ICD-10-CM | POA: Diagnosis not present

## 2020-12-18 DIAGNOSIS — I679 Cerebrovascular disease, unspecified: Secondary | ICD-10-CM | POA: Diagnosis not present

## 2020-12-18 DIAGNOSIS — E119 Type 2 diabetes mellitus without complications: Secondary | ICD-10-CM | POA: Diagnosis not present

## 2020-12-18 DIAGNOSIS — M6281 Muscle weakness (generalized): Secondary | ICD-10-CM | POA: Diagnosis not present

## 2020-12-18 DIAGNOSIS — N184 Chronic kidney disease, stage 4 (severe): Secondary | ICD-10-CM | POA: Diagnosis not present

## 2020-12-18 DIAGNOSIS — R55 Syncope and collapse: Secondary | ICD-10-CM | POA: Diagnosis not present

## 2020-12-18 DIAGNOSIS — R488 Other symbolic dysfunctions: Secondary | ICD-10-CM | POA: Diagnosis not present

## 2020-12-19 DIAGNOSIS — M6281 Muscle weakness (generalized): Secondary | ICD-10-CM | POA: Diagnosis not present

## 2020-12-19 DIAGNOSIS — E119 Type 2 diabetes mellitus without complications: Secondary | ICD-10-CM | POA: Diagnosis not present

## 2020-12-19 DIAGNOSIS — N184 Chronic kidney disease, stage 4 (severe): Secondary | ICD-10-CM | POA: Diagnosis not present

## 2020-12-19 DIAGNOSIS — R2689 Other abnormalities of gait and mobility: Secondary | ICD-10-CM | POA: Diagnosis not present

## 2020-12-19 DIAGNOSIS — R278 Other lack of coordination: Secondary | ICD-10-CM | POA: Diagnosis not present

## 2020-12-19 DIAGNOSIS — R488 Other symbolic dysfunctions: Secondary | ICD-10-CM | POA: Diagnosis not present

## 2020-12-19 DIAGNOSIS — I679 Cerebrovascular disease, unspecified: Secondary | ICD-10-CM | POA: Diagnosis not present

## 2020-12-19 DIAGNOSIS — R2681 Unsteadiness on feet: Secondary | ICD-10-CM | POA: Diagnosis not present

## 2020-12-19 DIAGNOSIS — R55 Syncope and collapse: Secondary | ICD-10-CM | POA: Diagnosis not present

## 2020-12-19 DIAGNOSIS — R296 Repeated falls: Secondary | ICD-10-CM | POA: Diagnosis not present

## 2020-12-21 DIAGNOSIS — R2689 Other abnormalities of gait and mobility: Secondary | ICD-10-CM | POA: Diagnosis not present

## 2020-12-21 DIAGNOSIS — R2681 Unsteadiness on feet: Secondary | ICD-10-CM | POA: Diagnosis not present

## 2020-12-21 DIAGNOSIS — R278 Other lack of coordination: Secondary | ICD-10-CM | POA: Diagnosis not present

## 2020-12-21 DIAGNOSIS — R55 Syncope and collapse: Secondary | ICD-10-CM | POA: Diagnosis not present

## 2020-12-21 DIAGNOSIS — N184 Chronic kidney disease, stage 4 (severe): Secondary | ICD-10-CM | POA: Diagnosis not present

## 2020-12-21 DIAGNOSIS — R488 Other symbolic dysfunctions: Secondary | ICD-10-CM | POA: Diagnosis not present

## 2020-12-21 DIAGNOSIS — E119 Type 2 diabetes mellitus without complications: Secondary | ICD-10-CM | POA: Diagnosis not present

## 2020-12-21 DIAGNOSIS — M6281 Muscle weakness (generalized): Secondary | ICD-10-CM | POA: Diagnosis not present

## 2020-12-21 DIAGNOSIS — I679 Cerebrovascular disease, unspecified: Secondary | ICD-10-CM | POA: Diagnosis not present

## 2020-12-21 DIAGNOSIS — R296 Repeated falls: Secondary | ICD-10-CM | POA: Diagnosis not present

## 2020-12-22 DIAGNOSIS — I679 Cerebrovascular disease, unspecified: Secondary | ICD-10-CM | POA: Diagnosis not present

## 2020-12-22 DIAGNOSIS — R296 Repeated falls: Secondary | ICD-10-CM | POA: Diagnosis not present

## 2020-12-22 DIAGNOSIS — N184 Chronic kidney disease, stage 4 (severe): Secondary | ICD-10-CM | POA: Diagnosis not present

## 2020-12-22 DIAGNOSIS — M6281 Muscle weakness (generalized): Secondary | ICD-10-CM | POA: Diagnosis not present

## 2020-12-22 DIAGNOSIS — R488 Other symbolic dysfunctions: Secondary | ICD-10-CM | POA: Diagnosis not present

## 2020-12-22 DIAGNOSIS — R2681 Unsteadiness on feet: Secondary | ICD-10-CM | POA: Diagnosis not present

## 2020-12-22 DIAGNOSIS — R55 Syncope and collapse: Secondary | ICD-10-CM | POA: Diagnosis not present

## 2020-12-22 DIAGNOSIS — R2689 Other abnormalities of gait and mobility: Secondary | ICD-10-CM | POA: Diagnosis not present

## 2020-12-22 DIAGNOSIS — E119 Type 2 diabetes mellitus without complications: Secondary | ICD-10-CM | POA: Diagnosis not present

## 2020-12-22 DIAGNOSIS — R278 Other lack of coordination: Secondary | ICD-10-CM | POA: Diagnosis not present

## 2020-12-23 DIAGNOSIS — R278 Other lack of coordination: Secondary | ICD-10-CM | POA: Diagnosis not present

## 2020-12-23 DIAGNOSIS — M6281 Muscle weakness (generalized): Secondary | ICD-10-CM | POA: Diagnosis not present

## 2020-12-23 DIAGNOSIS — R2689 Other abnormalities of gait and mobility: Secondary | ICD-10-CM | POA: Diagnosis not present

## 2020-12-23 DIAGNOSIS — N184 Chronic kidney disease, stage 4 (severe): Secondary | ICD-10-CM | POA: Diagnosis not present

## 2020-12-23 DIAGNOSIS — R55 Syncope and collapse: Secondary | ICD-10-CM | POA: Diagnosis not present

## 2020-12-23 DIAGNOSIS — R2681 Unsteadiness on feet: Secondary | ICD-10-CM | POA: Diagnosis not present

## 2020-12-23 DIAGNOSIS — R296 Repeated falls: Secondary | ICD-10-CM | POA: Diagnosis not present

## 2020-12-23 DIAGNOSIS — E119 Type 2 diabetes mellitus without complications: Secondary | ICD-10-CM | POA: Diagnosis not present

## 2020-12-23 DIAGNOSIS — R488 Other symbolic dysfunctions: Secondary | ICD-10-CM | POA: Diagnosis not present

## 2020-12-23 DIAGNOSIS — I679 Cerebrovascular disease, unspecified: Secondary | ICD-10-CM | POA: Diagnosis not present

## 2020-12-24 DIAGNOSIS — I679 Cerebrovascular disease, unspecified: Secondary | ICD-10-CM | POA: Diagnosis not present

## 2020-12-24 DIAGNOSIS — E119 Type 2 diabetes mellitus without complications: Secondary | ICD-10-CM | POA: Diagnosis not present

## 2020-12-24 DIAGNOSIS — R488 Other symbolic dysfunctions: Secondary | ICD-10-CM | POA: Diagnosis not present

## 2020-12-24 DIAGNOSIS — N184 Chronic kidney disease, stage 4 (severe): Secondary | ICD-10-CM | POA: Diagnosis not present

## 2020-12-24 DIAGNOSIS — R278 Other lack of coordination: Secondary | ICD-10-CM | POA: Diagnosis not present

## 2020-12-24 DIAGNOSIS — R55 Syncope and collapse: Secondary | ICD-10-CM | POA: Diagnosis not present

## 2020-12-24 DIAGNOSIS — M6281 Muscle weakness (generalized): Secondary | ICD-10-CM | POA: Diagnosis not present

## 2020-12-24 DIAGNOSIS — R2681 Unsteadiness on feet: Secondary | ICD-10-CM | POA: Diagnosis not present

## 2020-12-24 DIAGNOSIS — R296 Repeated falls: Secondary | ICD-10-CM | POA: Diagnosis not present

## 2020-12-24 DIAGNOSIS — R2689 Other abnormalities of gait and mobility: Secondary | ICD-10-CM | POA: Diagnosis not present

## 2020-12-25 DIAGNOSIS — E119 Type 2 diabetes mellitus without complications: Secondary | ICD-10-CM | POA: Diagnosis not present

## 2020-12-25 DIAGNOSIS — R296 Repeated falls: Secondary | ICD-10-CM | POA: Diagnosis not present

## 2020-12-25 DIAGNOSIS — I679 Cerebrovascular disease, unspecified: Secondary | ICD-10-CM | POA: Diagnosis not present

## 2020-12-25 DIAGNOSIS — N184 Chronic kidney disease, stage 4 (severe): Secondary | ICD-10-CM | POA: Diagnosis not present

## 2020-12-25 DIAGNOSIS — R2681 Unsteadiness on feet: Secondary | ICD-10-CM | POA: Diagnosis not present

## 2020-12-25 DIAGNOSIS — R278 Other lack of coordination: Secondary | ICD-10-CM | POA: Diagnosis not present

## 2020-12-25 DIAGNOSIS — R2689 Other abnormalities of gait and mobility: Secondary | ICD-10-CM | POA: Diagnosis not present

## 2020-12-25 DIAGNOSIS — R488 Other symbolic dysfunctions: Secondary | ICD-10-CM | POA: Diagnosis not present

## 2020-12-25 DIAGNOSIS — M6281 Muscle weakness (generalized): Secondary | ICD-10-CM | POA: Diagnosis not present

## 2020-12-25 DIAGNOSIS — R55 Syncope and collapse: Secondary | ICD-10-CM | POA: Diagnosis not present

## 2020-12-28 DIAGNOSIS — R278 Other lack of coordination: Secondary | ICD-10-CM | POA: Diagnosis not present

## 2020-12-28 DIAGNOSIS — I679 Cerebrovascular disease, unspecified: Secondary | ICD-10-CM | POA: Diagnosis not present

## 2020-12-28 DIAGNOSIS — N184 Chronic kidney disease, stage 4 (severe): Secondary | ICD-10-CM | POA: Diagnosis not present

## 2020-12-28 DIAGNOSIS — R488 Other symbolic dysfunctions: Secondary | ICD-10-CM | POA: Diagnosis not present

## 2020-12-28 DIAGNOSIS — R2681 Unsteadiness on feet: Secondary | ICD-10-CM | POA: Diagnosis not present

## 2020-12-28 DIAGNOSIS — R69 Illness, unspecified: Secondary | ICD-10-CM | POA: Diagnosis not present

## 2020-12-28 DIAGNOSIS — R296 Repeated falls: Secondary | ICD-10-CM | POA: Diagnosis not present

## 2020-12-28 DIAGNOSIS — R55 Syncope and collapse: Secondary | ICD-10-CM | POA: Diagnosis not present

## 2020-12-28 DIAGNOSIS — I10 Essential (primary) hypertension: Secondary | ICD-10-CM | POA: Diagnosis not present

## 2020-12-28 DIAGNOSIS — E119 Type 2 diabetes mellitus without complications: Secondary | ICD-10-CM | POA: Diagnosis not present

## 2020-12-28 DIAGNOSIS — R2689 Other abnormalities of gait and mobility: Secondary | ICD-10-CM | POA: Diagnosis not present

## 2020-12-28 DIAGNOSIS — M6281 Muscle weakness (generalized): Secondary | ICD-10-CM | POA: Diagnosis not present

## 2020-12-29 DIAGNOSIS — R278 Other lack of coordination: Secondary | ICD-10-CM | POA: Diagnosis not present

## 2020-12-29 DIAGNOSIS — R488 Other symbolic dysfunctions: Secondary | ICD-10-CM | POA: Diagnosis not present

## 2020-12-29 DIAGNOSIS — R2689 Other abnormalities of gait and mobility: Secondary | ICD-10-CM | POA: Diagnosis not present

## 2020-12-29 DIAGNOSIS — R2681 Unsteadiness on feet: Secondary | ICD-10-CM | POA: Diagnosis not present

## 2020-12-29 DIAGNOSIS — N184 Chronic kidney disease, stage 4 (severe): Secondary | ICD-10-CM | POA: Diagnosis not present

## 2020-12-29 DIAGNOSIS — M6281 Muscle weakness (generalized): Secondary | ICD-10-CM | POA: Diagnosis not present

## 2020-12-29 DIAGNOSIS — E119 Type 2 diabetes mellitus without complications: Secondary | ICD-10-CM | POA: Diagnosis not present

## 2020-12-29 DIAGNOSIS — R296 Repeated falls: Secondary | ICD-10-CM | POA: Diagnosis not present

## 2020-12-29 DIAGNOSIS — I679 Cerebrovascular disease, unspecified: Secondary | ICD-10-CM | POA: Diagnosis not present

## 2020-12-29 DIAGNOSIS — R55 Syncope and collapse: Secondary | ICD-10-CM | POA: Diagnosis not present

## 2020-12-30 DIAGNOSIS — I679 Cerebrovascular disease, unspecified: Secondary | ICD-10-CM | POA: Diagnosis not present

## 2020-12-30 DIAGNOSIS — E119 Type 2 diabetes mellitus without complications: Secondary | ICD-10-CM | POA: Diagnosis not present

## 2020-12-30 DIAGNOSIS — N184 Chronic kidney disease, stage 4 (severe): Secondary | ICD-10-CM | POA: Diagnosis not present

## 2020-12-30 DIAGNOSIS — M6281 Muscle weakness (generalized): Secondary | ICD-10-CM | POA: Diagnosis not present

## 2020-12-30 DIAGNOSIS — R488 Other symbolic dysfunctions: Secondary | ICD-10-CM | POA: Diagnosis not present

## 2020-12-30 DIAGNOSIS — R278 Other lack of coordination: Secondary | ICD-10-CM | POA: Diagnosis not present

## 2020-12-30 DIAGNOSIS — R2681 Unsteadiness on feet: Secondary | ICD-10-CM | POA: Diagnosis not present

## 2020-12-30 DIAGNOSIS — R2689 Other abnormalities of gait and mobility: Secondary | ICD-10-CM | POA: Diagnosis not present

## 2020-12-30 DIAGNOSIS — R55 Syncope and collapse: Secondary | ICD-10-CM | POA: Diagnosis not present

## 2020-12-30 DIAGNOSIS — R296 Repeated falls: Secondary | ICD-10-CM | POA: Diagnosis not present

## 2020-12-31 DIAGNOSIS — R2689 Other abnormalities of gait and mobility: Secondary | ICD-10-CM | POA: Diagnosis not present

## 2020-12-31 DIAGNOSIS — R2681 Unsteadiness on feet: Secondary | ICD-10-CM | POA: Diagnosis not present

## 2020-12-31 DIAGNOSIS — R296 Repeated falls: Secondary | ICD-10-CM | POA: Diagnosis not present

## 2020-12-31 DIAGNOSIS — I679 Cerebrovascular disease, unspecified: Secondary | ICD-10-CM | POA: Diagnosis not present

## 2020-12-31 DIAGNOSIS — R55 Syncope and collapse: Secondary | ICD-10-CM | POA: Diagnosis not present

## 2020-12-31 DIAGNOSIS — N184 Chronic kidney disease, stage 4 (severe): Secondary | ICD-10-CM | POA: Diagnosis not present

## 2020-12-31 DIAGNOSIS — R488 Other symbolic dysfunctions: Secondary | ICD-10-CM | POA: Diagnosis not present

## 2020-12-31 DIAGNOSIS — R278 Other lack of coordination: Secondary | ICD-10-CM | POA: Diagnosis not present

## 2020-12-31 DIAGNOSIS — M6281 Muscle weakness (generalized): Secondary | ICD-10-CM | POA: Diagnosis not present

## 2020-12-31 DIAGNOSIS — E119 Type 2 diabetes mellitus without complications: Secondary | ICD-10-CM | POA: Diagnosis not present

## 2021-01-01 DIAGNOSIS — M6281 Muscle weakness (generalized): Secondary | ICD-10-CM | POA: Diagnosis not present

## 2021-01-01 DIAGNOSIS — R488 Other symbolic dysfunctions: Secondary | ICD-10-CM | POA: Diagnosis not present

## 2021-01-01 DIAGNOSIS — R2681 Unsteadiness on feet: Secondary | ICD-10-CM | POA: Diagnosis not present

## 2021-01-01 DIAGNOSIS — R296 Repeated falls: Secondary | ICD-10-CM | POA: Diagnosis not present

## 2021-01-01 DIAGNOSIS — R55 Syncope and collapse: Secondary | ICD-10-CM | POA: Diagnosis not present

## 2021-01-01 DIAGNOSIS — N184 Chronic kidney disease, stage 4 (severe): Secondary | ICD-10-CM | POA: Diagnosis not present

## 2021-01-01 DIAGNOSIS — R2689 Other abnormalities of gait and mobility: Secondary | ICD-10-CM | POA: Diagnosis not present

## 2021-01-01 DIAGNOSIS — I679 Cerebrovascular disease, unspecified: Secondary | ICD-10-CM | POA: Diagnosis not present

## 2021-01-01 DIAGNOSIS — E119 Type 2 diabetes mellitus without complications: Secondary | ICD-10-CM | POA: Diagnosis not present

## 2021-01-01 DIAGNOSIS — R278 Other lack of coordination: Secondary | ICD-10-CM | POA: Diagnosis not present

## 2021-01-04 DIAGNOSIS — E119 Type 2 diabetes mellitus without complications: Secondary | ICD-10-CM | POA: Diagnosis not present

## 2021-01-04 DIAGNOSIS — I679 Cerebrovascular disease, unspecified: Secondary | ICD-10-CM | POA: Diagnosis not present

## 2021-01-04 DIAGNOSIS — N184 Chronic kidney disease, stage 4 (severe): Secondary | ICD-10-CM | POA: Diagnosis not present

## 2021-01-04 DIAGNOSIS — R2681 Unsteadiness on feet: Secondary | ICD-10-CM | POA: Diagnosis not present

## 2021-01-04 DIAGNOSIS — M6281 Muscle weakness (generalized): Secondary | ICD-10-CM | POA: Diagnosis not present

## 2021-01-04 DIAGNOSIS — R55 Syncope and collapse: Secondary | ICD-10-CM | POA: Diagnosis not present

## 2021-01-04 DIAGNOSIS — R296 Repeated falls: Secondary | ICD-10-CM | POA: Diagnosis not present

## 2021-01-04 DIAGNOSIS — R488 Other symbolic dysfunctions: Secondary | ICD-10-CM | POA: Diagnosis not present

## 2021-01-04 DIAGNOSIS — R278 Other lack of coordination: Secondary | ICD-10-CM | POA: Diagnosis not present

## 2021-01-04 DIAGNOSIS — R2689 Other abnormalities of gait and mobility: Secondary | ICD-10-CM | POA: Diagnosis not present

## 2021-01-05 DIAGNOSIS — R488 Other symbolic dysfunctions: Secondary | ICD-10-CM | POA: Diagnosis not present

## 2021-01-05 DIAGNOSIS — I679 Cerebrovascular disease, unspecified: Secondary | ICD-10-CM | POA: Diagnosis not present

## 2021-01-05 DIAGNOSIS — R2681 Unsteadiness on feet: Secondary | ICD-10-CM | POA: Diagnosis not present

## 2021-01-05 DIAGNOSIS — M6281 Muscle weakness (generalized): Secondary | ICD-10-CM | POA: Diagnosis not present

## 2021-01-05 DIAGNOSIS — R296 Repeated falls: Secondary | ICD-10-CM | POA: Diagnosis not present

## 2021-01-05 DIAGNOSIS — E119 Type 2 diabetes mellitus without complications: Secondary | ICD-10-CM | POA: Diagnosis not present

## 2021-01-05 DIAGNOSIS — N184 Chronic kidney disease, stage 4 (severe): Secondary | ICD-10-CM | POA: Diagnosis not present

## 2021-01-05 DIAGNOSIS — R2689 Other abnormalities of gait and mobility: Secondary | ICD-10-CM | POA: Diagnosis not present

## 2021-01-05 DIAGNOSIS — R55 Syncope and collapse: Secondary | ICD-10-CM | POA: Diagnosis not present

## 2021-01-05 DIAGNOSIS — R278 Other lack of coordination: Secondary | ICD-10-CM | POA: Diagnosis not present

## 2021-01-06 DIAGNOSIS — R55 Syncope and collapse: Secondary | ICD-10-CM | POA: Diagnosis not present

## 2021-01-06 DIAGNOSIS — R2689 Other abnormalities of gait and mobility: Secondary | ICD-10-CM | POA: Diagnosis not present

## 2021-01-06 DIAGNOSIS — R2681 Unsteadiness on feet: Secondary | ICD-10-CM | POA: Diagnosis not present

## 2021-01-06 DIAGNOSIS — R278 Other lack of coordination: Secondary | ICD-10-CM | POA: Diagnosis not present

## 2021-01-06 DIAGNOSIS — R296 Repeated falls: Secondary | ICD-10-CM | POA: Diagnosis not present

## 2021-01-06 DIAGNOSIS — E119 Type 2 diabetes mellitus without complications: Secondary | ICD-10-CM | POA: Diagnosis not present

## 2021-01-06 DIAGNOSIS — N184 Chronic kidney disease, stage 4 (severe): Secondary | ICD-10-CM | POA: Diagnosis not present

## 2021-01-06 DIAGNOSIS — I679 Cerebrovascular disease, unspecified: Secondary | ICD-10-CM | POA: Diagnosis not present

## 2021-01-06 DIAGNOSIS — M6281 Muscle weakness (generalized): Secondary | ICD-10-CM | POA: Diagnosis not present

## 2021-01-06 DIAGNOSIS — R488 Other symbolic dysfunctions: Secondary | ICD-10-CM | POA: Diagnosis not present

## 2021-01-07 DIAGNOSIS — R2681 Unsteadiness on feet: Secondary | ICD-10-CM | POA: Diagnosis not present

## 2021-01-07 DIAGNOSIS — I679 Cerebrovascular disease, unspecified: Secondary | ICD-10-CM | POA: Diagnosis not present

## 2021-01-07 DIAGNOSIS — R55 Syncope and collapse: Secondary | ICD-10-CM | POA: Diagnosis not present

## 2021-01-07 DIAGNOSIS — R278 Other lack of coordination: Secondary | ICD-10-CM | POA: Diagnosis not present

## 2021-01-07 DIAGNOSIS — R488 Other symbolic dysfunctions: Secondary | ICD-10-CM | POA: Diagnosis not present

## 2021-01-07 DIAGNOSIS — R2689 Other abnormalities of gait and mobility: Secondary | ICD-10-CM | POA: Diagnosis not present

## 2021-01-07 DIAGNOSIS — R296 Repeated falls: Secondary | ICD-10-CM | POA: Diagnosis not present

## 2021-01-07 DIAGNOSIS — N184 Chronic kidney disease, stage 4 (severe): Secondary | ICD-10-CM | POA: Diagnosis not present

## 2021-01-07 DIAGNOSIS — E119 Type 2 diabetes mellitus without complications: Secondary | ICD-10-CM | POA: Diagnosis not present

## 2021-01-07 DIAGNOSIS — M6281 Muscle weakness (generalized): Secondary | ICD-10-CM | POA: Diagnosis not present

## 2021-01-08 DIAGNOSIS — R296 Repeated falls: Secondary | ICD-10-CM | POA: Diagnosis not present

## 2021-01-08 DIAGNOSIS — I679 Cerebrovascular disease, unspecified: Secondary | ICD-10-CM | POA: Diagnosis not present

## 2021-01-08 DIAGNOSIS — M6281 Muscle weakness (generalized): Secondary | ICD-10-CM | POA: Diagnosis not present

## 2021-01-08 DIAGNOSIS — R488 Other symbolic dysfunctions: Secondary | ICD-10-CM | POA: Diagnosis not present

## 2021-01-08 DIAGNOSIS — N184 Chronic kidney disease, stage 4 (severe): Secondary | ICD-10-CM | POA: Diagnosis not present

## 2021-01-08 DIAGNOSIS — R55 Syncope and collapse: Secondary | ICD-10-CM | POA: Diagnosis not present

## 2021-01-08 DIAGNOSIS — R2681 Unsteadiness on feet: Secondary | ICD-10-CM | POA: Diagnosis not present

## 2021-01-08 DIAGNOSIS — E119 Type 2 diabetes mellitus without complications: Secondary | ICD-10-CM | POA: Diagnosis not present

## 2021-01-08 DIAGNOSIS — R278 Other lack of coordination: Secondary | ICD-10-CM | POA: Diagnosis not present

## 2021-01-08 DIAGNOSIS — R2689 Other abnormalities of gait and mobility: Secondary | ICD-10-CM | POA: Diagnosis not present

## 2021-01-09 DIAGNOSIS — R296 Repeated falls: Secondary | ICD-10-CM | POA: Diagnosis not present

## 2021-01-09 DIAGNOSIS — R2681 Unsteadiness on feet: Secondary | ICD-10-CM | POA: Diagnosis not present

## 2021-01-09 DIAGNOSIS — R55 Syncope and collapse: Secondary | ICD-10-CM | POA: Diagnosis not present

## 2021-01-09 DIAGNOSIS — R488 Other symbolic dysfunctions: Secondary | ICD-10-CM | POA: Diagnosis not present

## 2021-01-09 DIAGNOSIS — N184 Chronic kidney disease, stage 4 (severe): Secondary | ICD-10-CM | POA: Diagnosis not present

## 2021-01-09 DIAGNOSIS — R278 Other lack of coordination: Secondary | ICD-10-CM | POA: Diagnosis not present

## 2021-01-09 DIAGNOSIS — R2689 Other abnormalities of gait and mobility: Secondary | ICD-10-CM | POA: Diagnosis not present

## 2021-01-09 DIAGNOSIS — E119 Type 2 diabetes mellitus without complications: Secondary | ICD-10-CM | POA: Diagnosis not present

## 2021-01-09 DIAGNOSIS — I679 Cerebrovascular disease, unspecified: Secondary | ICD-10-CM | POA: Diagnosis not present

## 2021-01-09 DIAGNOSIS — M6281 Muscle weakness (generalized): Secondary | ICD-10-CM | POA: Diagnosis not present

## 2021-01-11 DIAGNOSIS — N184 Chronic kidney disease, stage 4 (severe): Secondary | ICD-10-CM | POA: Diagnosis not present

## 2021-01-11 DIAGNOSIS — R55 Syncope and collapse: Secondary | ICD-10-CM | POA: Diagnosis not present

## 2021-01-11 DIAGNOSIS — E119 Type 2 diabetes mellitus without complications: Secondary | ICD-10-CM | POA: Diagnosis not present

## 2021-01-11 DIAGNOSIS — I679 Cerebrovascular disease, unspecified: Secondary | ICD-10-CM | POA: Diagnosis not present

## 2021-01-11 DIAGNOSIS — M6281 Muscle weakness (generalized): Secondary | ICD-10-CM | POA: Diagnosis not present

## 2021-01-11 DIAGNOSIS — R2689 Other abnormalities of gait and mobility: Secondary | ICD-10-CM | POA: Diagnosis not present

## 2021-01-11 DIAGNOSIS — R278 Other lack of coordination: Secondary | ICD-10-CM | POA: Diagnosis not present

## 2021-01-11 DIAGNOSIS — R296 Repeated falls: Secondary | ICD-10-CM | POA: Diagnosis not present

## 2021-01-11 DIAGNOSIS — R488 Other symbolic dysfunctions: Secondary | ICD-10-CM | POA: Diagnosis not present

## 2021-01-12 DIAGNOSIS — N184 Chronic kidney disease, stage 4 (severe): Secondary | ICD-10-CM | POA: Diagnosis not present

## 2021-01-12 DIAGNOSIS — M6281 Muscle weakness (generalized): Secondary | ICD-10-CM | POA: Diagnosis not present

## 2021-01-12 DIAGNOSIS — R2689 Other abnormalities of gait and mobility: Secondary | ICD-10-CM | POA: Diagnosis not present

## 2021-01-12 DIAGNOSIS — R296 Repeated falls: Secondary | ICD-10-CM | POA: Diagnosis not present

## 2021-01-12 DIAGNOSIS — R488 Other symbolic dysfunctions: Secondary | ICD-10-CM | POA: Diagnosis not present

## 2021-01-12 DIAGNOSIS — R278 Other lack of coordination: Secondary | ICD-10-CM | POA: Diagnosis not present

## 2021-01-12 DIAGNOSIS — R55 Syncope and collapse: Secondary | ICD-10-CM | POA: Diagnosis not present

## 2021-01-12 DIAGNOSIS — I679 Cerebrovascular disease, unspecified: Secondary | ICD-10-CM | POA: Diagnosis not present

## 2021-01-12 DIAGNOSIS — E119 Type 2 diabetes mellitus without complications: Secondary | ICD-10-CM | POA: Diagnosis not present

## 2021-01-14 DIAGNOSIS — R2689 Other abnormalities of gait and mobility: Secondary | ICD-10-CM | POA: Diagnosis not present

## 2021-01-14 DIAGNOSIS — M6281 Muscle weakness (generalized): Secondary | ICD-10-CM | POA: Diagnosis not present

## 2021-01-14 DIAGNOSIS — E119 Type 2 diabetes mellitus without complications: Secondary | ICD-10-CM | POA: Diagnosis not present

## 2021-01-14 DIAGNOSIS — R296 Repeated falls: Secondary | ICD-10-CM | POA: Diagnosis not present

## 2021-01-14 DIAGNOSIS — I679 Cerebrovascular disease, unspecified: Secondary | ICD-10-CM | POA: Diagnosis not present

## 2021-01-14 DIAGNOSIS — R278 Other lack of coordination: Secondary | ICD-10-CM | POA: Diagnosis not present

## 2021-01-14 DIAGNOSIS — N184 Chronic kidney disease, stage 4 (severe): Secondary | ICD-10-CM | POA: Diagnosis not present

## 2021-01-14 DIAGNOSIS — R55 Syncope and collapse: Secondary | ICD-10-CM | POA: Diagnosis not present

## 2021-01-14 DIAGNOSIS — U071 COVID-19: Secondary | ICD-10-CM | POA: Diagnosis not present

## 2021-01-14 DIAGNOSIS — R488 Other symbolic dysfunctions: Secondary | ICD-10-CM | POA: Diagnosis not present

## 2021-01-15 DIAGNOSIS — R2689 Other abnormalities of gait and mobility: Secondary | ICD-10-CM | POA: Diagnosis not present

## 2021-01-15 DIAGNOSIS — R296 Repeated falls: Secondary | ICD-10-CM | POA: Diagnosis not present

## 2021-01-15 DIAGNOSIS — N184 Chronic kidney disease, stage 4 (severe): Secondary | ICD-10-CM | POA: Diagnosis not present

## 2021-01-15 DIAGNOSIS — R55 Syncope and collapse: Secondary | ICD-10-CM | POA: Diagnosis not present

## 2021-01-15 DIAGNOSIS — E119 Type 2 diabetes mellitus without complications: Secondary | ICD-10-CM | POA: Diagnosis not present

## 2021-01-15 DIAGNOSIS — R488 Other symbolic dysfunctions: Secondary | ICD-10-CM | POA: Diagnosis not present

## 2021-01-15 DIAGNOSIS — M6281 Muscle weakness (generalized): Secondary | ICD-10-CM | POA: Diagnosis not present

## 2021-01-15 DIAGNOSIS — R278 Other lack of coordination: Secondary | ICD-10-CM | POA: Diagnosis not present

## 2021-01-15 DIAGNOSIS — I679 Cerebrovascular disease, unspecified: Secondary | ICD-10-CM | POA: Diagnosis not present

## 2021-01-18 DIAGNOSIS — L603 Nail dystrophy: Secondary | ICD-10-CM | POA: Diagnosis not present

## 2021-01-18 DIAGNOSIS — M2041 Other hammer toe(s) (acquired), right foot: Secondary | ICD-10-CM | POA: Diagnosis not present

## 2021-01-18 DIAGNOSIS — E1122 Type 2 diabetes mellitus with diabetic chronic kidney disease: Secondary | ICD-10-CM | POA: Diagnosis not present

## 2021-01-18 DIAGNOSIS — M2042 Other hammer toe(s) (acquired), left foot: Secondary | ICD-10-CM | POA: Diagnosis not present

## 2021-02-05 DIAGNOSIS — E119 Type 2 diabetes mellitus without complications: Secondary | ICD-10-CM | POA: Diagnosis not present

## 2021-02-05 DIAGNOSIS — H524 Presbyopia: Secondary | ICD-10-CM | POA: Diagnosis not present

## 2021-02-05 DIAGNOSIS — H353131 Nonexudative age-related macular degeneration, bilateral, early dry stage: Secondary | ICD-10-CM | POA: Diagnosis not present

## 2021-02-05 DIAGNOSIS — H2513 Age-related nuclear cataract, bilateral: Secondary | ICD-10-CM | POA: Diagnosis not present

## 2021-02-08 DIAGNOSIS — R69 Illness, unspecified: Secondary | ICD-10-CM | POA: Diagnosis not present

## 2021-02-09 DIAGNOSIS — E785 Hyperlipidemia, unspecified: Secondary | ICD-10-CM | POA: Diagnosis not present

## 2021-02-09 DIAGNOSIS — E538 Deficiency of other specified B group vitamins: Secondary | ICD-10-CM | POA: Diagnosis not present

## 2021-02-09 DIAGNOSIS — I10 Essential (primary) hypertension: Secondary | ICD-10-CM | POA: Diagnosis not present

## 2021-02-18 DIAGNOSIS — R69 Illness, unspecified: Secondary | ICD-10-CM | POA: Diagnosis not present

## 2021-02-18 DIAGNOSIS — W19XXXD Unspecified fall, subsequent encounter: Secondary | ICD-10-CM | POA: Diagnosis not present

## 2021-02-18 DIAGNOSIS — R531 Weakness: Secondary | ICD-10-CM | POA: Diagnosis not present

## 2021-03-07 DIAGNOSIS — S92351A Displaced fracture of fifth metatarsal bone, right foot, initial encounter for closed fracture: Secondary | ICD-10-CM | POA: Diagnosis not present

## 2021-03-08 DIAGNOSIS — R69 Illness, unspecified: Secondary | ICD-10-CM | POA: Diagnosis not present

## 2021-03-08 DIAGNOSIS — W19XXXD Unspecified fall, subsequent encounter: Secondary | ICD-10-CM | POA: Diagnosis not present

## 2021-03-08 DIAGNOSIS — R531 Weakness: Secondary | ICD-10-CM | POA: Diagnosis not present

## 2021-03-11 DIAGNOSIS — S92353A Displaced fracture of fifth metatarsal bone, unspecified foot, initial encounter for closed fracture: Secondary | ICD-10-CM | POA: Diagnosis not present

## 2021-03-11 DIAGNOSIS — R69 Illness, unspecified: Secondary | ICD-10-CM | POA: Diagnosis not present

## 2021-03-11 DIAGNOSIS — Z9181 History of falling: Secondary | ICD-10-CM | POA: Diagnosis not present

## 2021-03-16 ENCOUNTER — Inpatient Hospital Stay (HOSPITAL_COMMUNITY)
Admission: EM | Admit: 2021-03-16 | Discharge: 2021-03-23 | DRG: 689 | Disposition: A | Discharge status: Skilled Nursing Facility | Payer: Medicare HMO | Source: Skilled Nursing Facility | Attending: Internal Medicine | Admitting: Internal Medicine

## 2021-03-16 ENCOUNTER — Emergency Department (HOSPITAL_COMMUNITY): Payer: Medicare HMO

## 2021-03-16 ENCOUNTER — Other Ambulatory Visit: Payer: Self-pay

## 2021-03-16 DIAGNOSIS — W050XXA Fall from non-moving wheelchair, initial encounter: Secondary | ICD-10-CM | POA: Diagnosis present

## 2021-03-16 DIAGNOSIS — Y92129 Unspecified place in nursing home as the place of occurrence of the external cause: Secondary | ICD-10-CM

## 2021-03-16 DIAGNOSIS — Z7982 Long term (current) use of aspirin: Secondary | ICD-10-CM

## 2021-03-16 DIAGNOSIS — G9341 Metabolic encephalopathy: Secondary | ICD-10-CM | POA: Diagnosis present

## 2021-03-16 DIAGNOSIS — W19XXXA Unspecified fall, initial encounter: Secondary | ICD-10-CM | POA: Diagnosis present

## 2021-03-16 DIAGNOSIS — E1122 Type 2 diabetes mellitus with diabetic chronic kidney disease: Secondary | ICD-10-CM | POA: Diagnosis present

## 2021-03-16 DIAGNOSIS — S92911A Unspecified fracture of right toe(s), initial encounter for closed fracture: Secondary | ICD-10-CM | POA: Diagnosis present

## 2021-03-16 DIAGNOSIS — N390 Urinary tract infection, site not specified: Principal | ICD-10-CM | POA: Diagnosis present

## 2021-03-16 DIAGNOSIS — Z79899 Other long term (current) drug therapy: Secondary | ICD-10-CM

## 2021-03-16 DIAGNOSIS — R633 Feeding difficulties, unspecified: Secondary | ICD-10-CM | POA: Diagnosis present

## 2021-03-16 DIAGNOSIS — F03918 Unspecified dementia, unspecified severity, with other behavioral disturbance: Secondary | ICD-10-CM | POA: Diagnosis present

## 2021-03-16 DIAGNOSIS — E785 Hyperlipidemia, unspecified: Secondary | ICD-10-CM | POA: Diagnosis present

## 2021-03-16 DIAGNOSIS — Z20822 Contact with and (suspected) exposure to covid-19: Secondary | ICD-10-CM | POA: Diagnosis present

## 2021-03-16 DIAGNOSIS — E860 Dehydration: Secondary | ICD-10-CM | POA: Diagnosis present

## 2021-03-16 DIAGNOSIS — R55 Syncope and collapse: Secondary | ICD-10-CM

## 2021-03-16 DIAGNOSIS — Z87891 Personal history of nicotine dependence: Secondary | ICD-10-CM

## 2021-03-16 DIAGNOSIS — Z8673 Personal history of transient ischemic attack (TIA), and cerebral infarction without residual deficits: Secondary | ICD-10-CM

## 2021-03-16 DIAGNOSIS — E1151 Type 2 diabetes mellitus with diabetic peripheral angiopathy without gangrene: Secondary | ICD-10-CM | POA: Diagnosis present

## 2021-03-16 DIAGNOSIS — E7800 Pure hypercholesterolemia, unspecified: Secondary | ICD-10-CM | POA: Diagnosis present

## 2021-03-16 DIAGNOSIS — F17210 Nicotine dependence, cigarettes, uncomplicated: Secondary | ICD-10-CM | POA: Diagnosis present

## 2021-03-16 DIAGNOSIS — N179 Acute kidney failure, unspecified: Secondary | ICD-10-CM | POA: Diagnosis present

## 2021-03-16 DIAGNOSIS — Z882 Allergy status to sulfonamides status: Secondary | ICD-10-CM

## 2021-03-16 DIAGNOSIS — I6529 Occlusion and stenosis of unspecified carotid artery: Secondary | ICD-10-CM | POA: Diagnosis not present

## 2021-03-16 DIAGNOSIS — S0990XA Unspecified injury of head, initial encounter: Secondary | ICD-10-CM | POA: Diagnosis not present

## 2021-03-16 DIAGNOSIS — Z952 Presence of prosthetic heart valve: Secondary | ICD-10-CM

## 2021-03-16 DIAGNOSIS — R410 Disorientation, unspecified: Secondary | ICD-10-CM | POA: Diagnosis not present

## 2021-03-16 DIAGNOSIS — Z720 Tobacco use: Secondary | ICD-10-CM | POA: Diagnosis present

## 2021-03-16 DIAGNOSIS — I2510 Atherosclerotic heart disease of native coronary artery without angina pectoris: Secondary | ICD-10-CM | POA: Diagnosis present

## 2021-03-16 DIAGNOSIS — Z681 Body mass index (BMI) 19 or less, adult: Secondary | ICD-10-CM

## 2021-03-16 DIAGNOSIS — R443 Hallucinations, unspecified: Secondary | ICD-10-CM | POA: Diagnosis present

## 2021-03-16 DIAGNOSIS — I6381 Other cerebral infarction due to occlusion or stenosis of small artery: Secondary | ICD-10-CM | POA: Diagnosis not present

## 2021-03-16 DIAGNOSIS — Z888 Allergy status to other drugs, medicaments and biological substances status: Secondary | ICD-10-CM

## 2021-03-16 DIAGNOSIS — Z885 Allergy status to narcotic agent status: Secondary | ICD-10-CM

## 2021-03-16 DIAGNOSIS — Z8249 Family history of ischemic heart disease and other diseases of the circulatory system: Secondary | ICD-10-CM

## 2021-03-16 DIAGNOSIS — R296 Repeated falls: Secondary | ICD-10-CM | POA: Diagnosis present

## 2021-03-16 DIAGNOSIS — I120 Hypertensive chronic kidney disease with stage 5 chronic kidney disease or end stage renal disease: Secondary | ICD-10-CM | POA: Diagnosis present

## 2021-03-16 DIAGNOSIS — I480 Paroxysmal atrial fibrillation: Secondary | ICD-10-CM | POA: Diagnosis present

## 2021-03-16 DIAGNOSIS — Z743 Need for continuous supervision: Secondary | ICD-10-CM | POA: Diagnosis not present

## 2021-03-16 DIAGNOSIS — N184 Chronic kidney disease, stage 4 (severe): Secondary | ICD-10-CM | POA: Diagnosis present

## 2021-03-16 DIAGNOSIS — N189 Chronic kidney disease, unspecified: Secondary | ICD-10-CM | POA: Diagnosis present

## 2021-03-16 DIAGNOSIS — G319 Degenerative disease of nervous system, unspecified: Secondary | ICD-10-CM | POA: Diagnosis not present

## 2021-03-16 DIAGNOSIS — Z880 Allergy status to penicillin: Secondary | ICD-10-CM

## 2021-03-16 DIAGNOSIS — F419 Anxiety disorder, unspecified: Secondary | ICD-10-CM | POA: Diagnosis present

## 2021-03-16 LAB — CBC WITH DIFFERENTIAL/PLATELET
Abs Immature Granulocytes: 0.05 10*3/uL (ref 0.00–0.07)
Basophils Absolute: 0.1 10*3/uL (ref 0.0–0.1)
Basophils Relative: 1 %
Eosinophils Absolute: 0.1 10*3/uL (ref 0.0–0.5)
Eosinophils Relative: 1 %
HCT: 37.6 % (ref 36.0–46.0)
Hemoglobin: 12.1 g/dL (ref 12.0–15.0)
Immature Granulocytes: 1 %
Lymphocytes Relative: 17 %
Lymphs Abs: 1.5 10*3/uL (ref 0.7–4.0)
MCH: 30 pg (ref 26.0–34.0)
MCHC: 32.2 g/dL (ref 30.0–36.0)
MCV: 93.3 fL (ref 80.0–100.0)
Monocytes Absolute: 0.8 10*3/uL (ref 0.1–1.0)
Monocytes Relative: 9 %
Neutro Abs: 6.3 10*3/uL (ref 1.7–7.7)
Neutrophils Relative %: 71 %
Platelets: 243 10*3/uL (ref 150–400)
RBC: 4.03 MIL/uL (ref 3.87–5.11)
RDW: 13.7 % (ref 11.5–15.5)
WBC: 8.7 10*3/uL (ref 4.0–10.5)
nRBC: 0 % (ref 0.0–0.2)

## 2021-03-16 LAB — COMPREHENSIVE METABOLIC PANEL
ALT: 11 U/L (ref 0–44)
AST: 17 U/L (ref 15–41)
Albumin: 3.8 g/dL (ref 3.5–5.0)
Alkaline Phosphatase: 63 U/L (ref 38–126)
Anion gap: 11 (ref 5–15)
BUN: 41 mg/dL — ABNORMAL HIGH (ref 8–23)
CO2: 20 mmol/L — ABNORMAL LOW (ref 22–32)
Calcium: 9.3 mg/dL (ref 8.9–10.3)
Chloride: 106 mmol/L (ref 98–111)
Creatinine, Ser: 2.74 mg/dL — ABNORMAL HIGH (ref 0.44–1.00)
GFR, Estimated: 17 mL/min — ABNORMAL LOW (ref >60)
Glucose, Bld: 135 mg/dL — ABNORMAL HIGH (ref 70–99)
Potassium: 4.5 mmol/L (ref 3.5–5.1)
Sodium: 137 mmol/L (ref 135–145)
Total Bilirubin: 0.9 mg/dL (ref 0.3–1.2)
Total Protein: 7.3 g/dL (ref 6.5–8.1)

## 2021-03-16 IMAGING — CT CT HEAD W/O CM
4 series · 17 of 47 positions shown, 19 images · non-contrast
Comparison: CT brain [DATE]

CLINICAL DATA: Head trauma

EXAM:
CT HEAD WITHOUT CONTRAST
TECHNIQUE: Contiguous axial images were obtained from the base of the skull
through the vertex without intravenous contrast.

[Series 3: head without · axial · non-contrast · 0.43mm/px · z∈[+1091,+1211]mm · 7 of 32 slices shown, 9 images]
[im 4/32  brain]
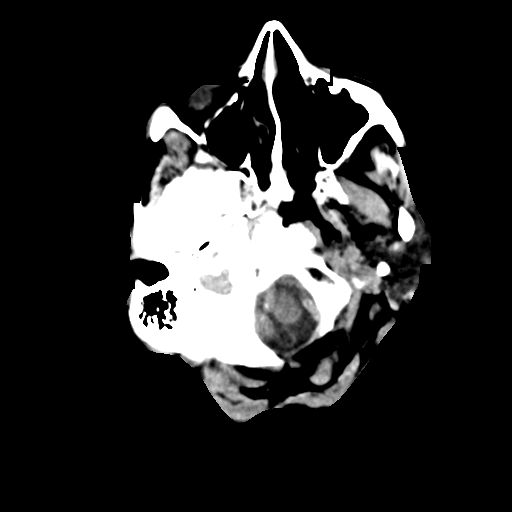
[im 4/32  bone]
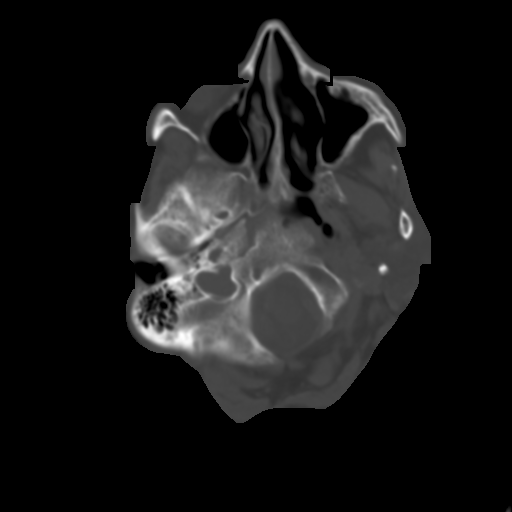
[im 8/32  brain]
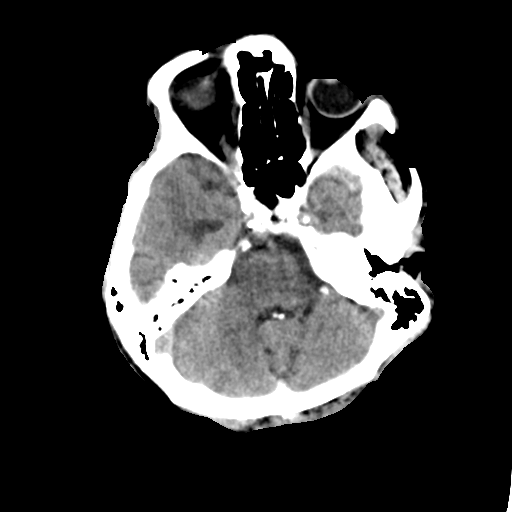
[im 12/32  brain]
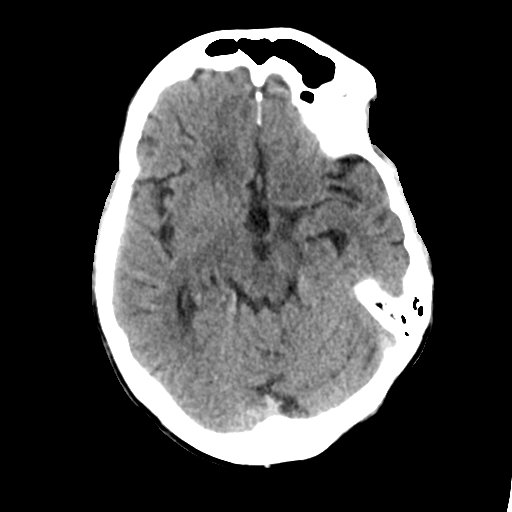
[im 16/32  brain]
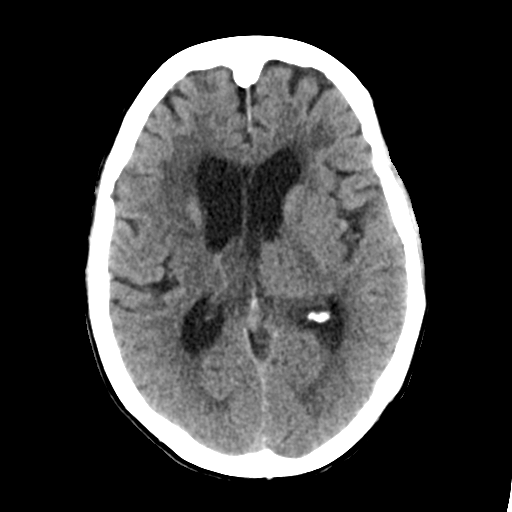
[im 20/32  brain]
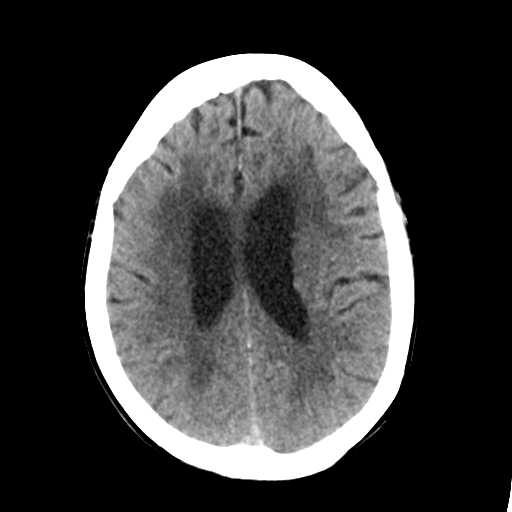
[im 20/32  bone]
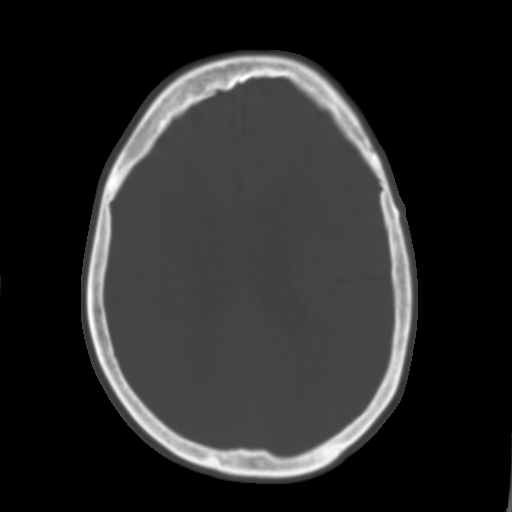
[im 24/32  brain]
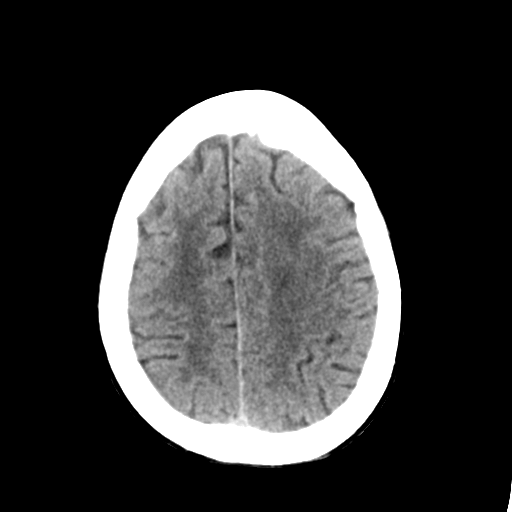
[im 28/32  brain]
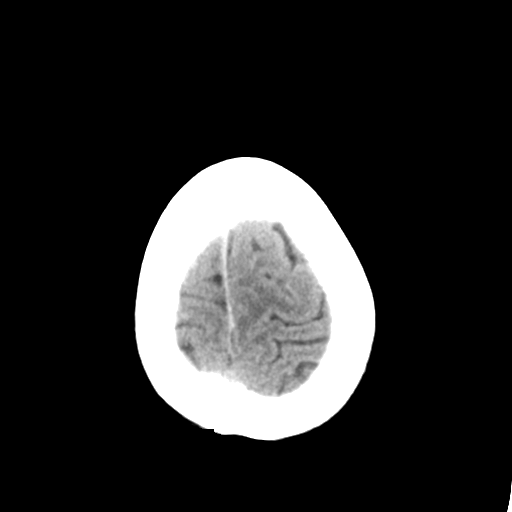

[Series 4: head bone · axial · 0.43mm/px · z∈[+1090,+1146]mm · 4 of 80 slices shown]
[im 8/80  bone]
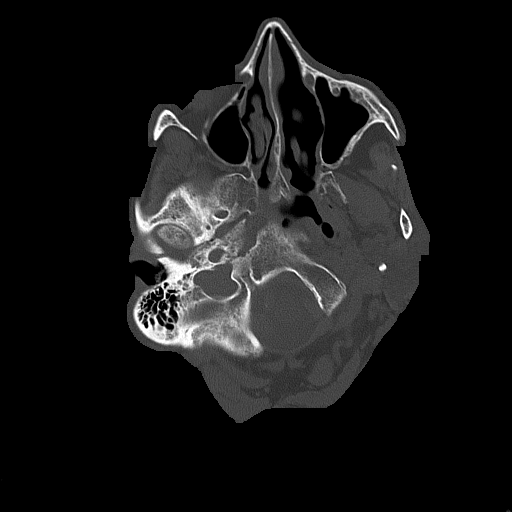
[im 16/80  bone]
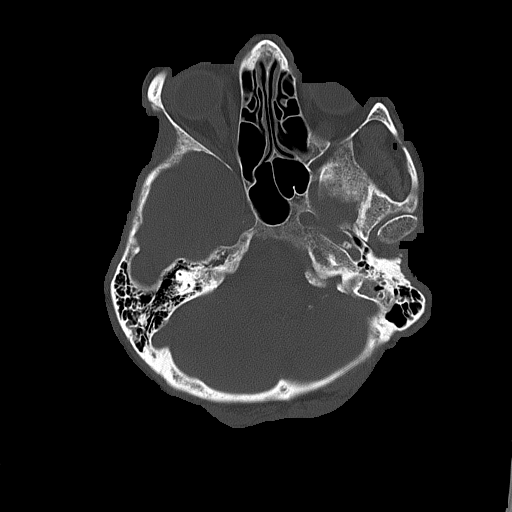
[im 24/80  bone]
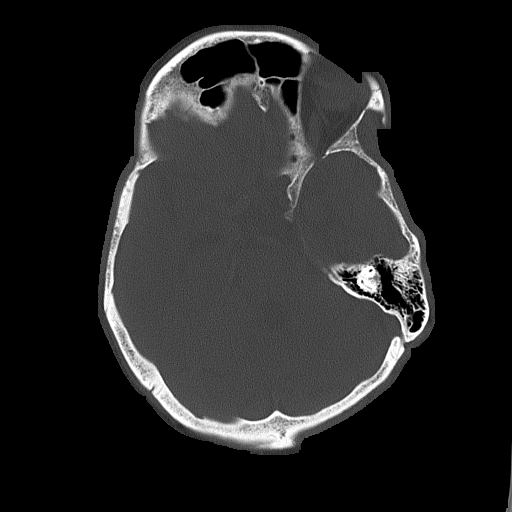
[im 36/80  bone]
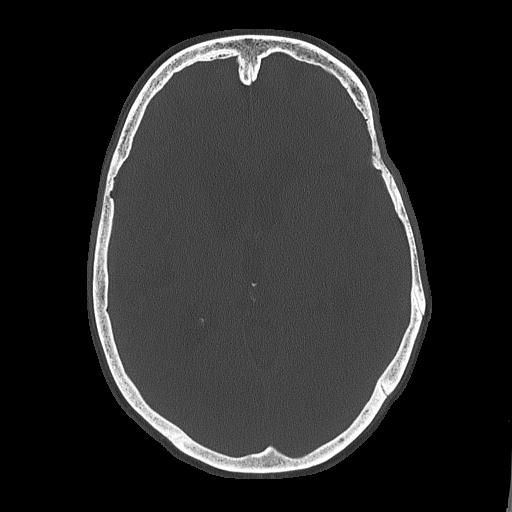

[Series 5: head without cor · coronal · non-contrast · 0.36mm/px · 3 of 70 slices shown]
[im 24/70  brain]
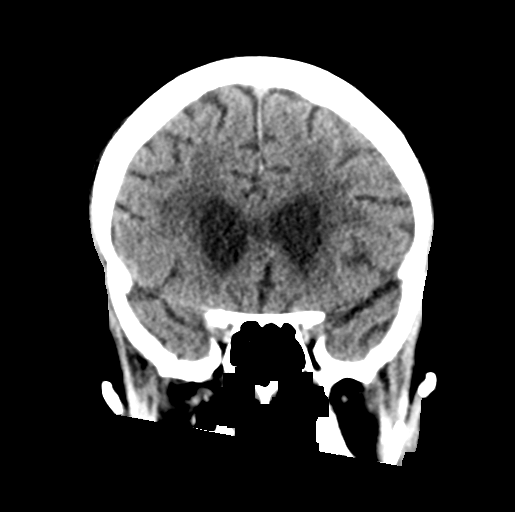
[im 31/70  brain]
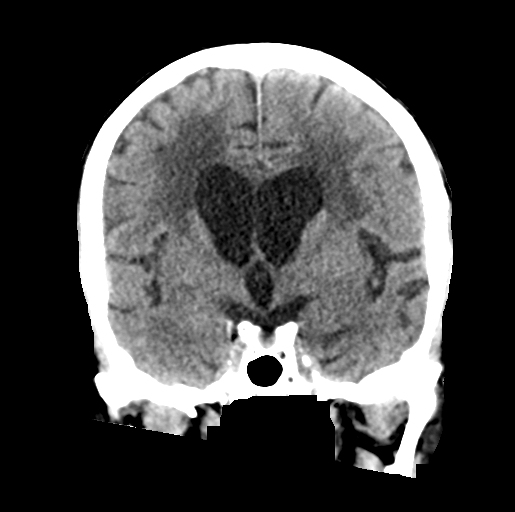
[im 39/70  brain]
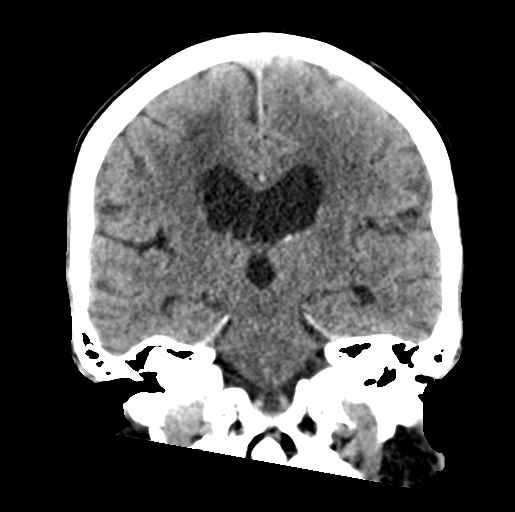

[Series 6: head without sag · sagittal · non-contrast · 0.34mm/px · 3 of 51 slices shown]
[im 19/51  brain]
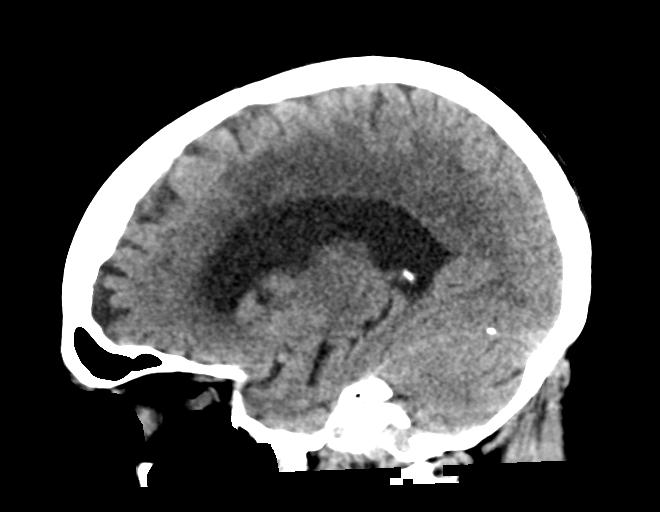
[im 26/51  brain]
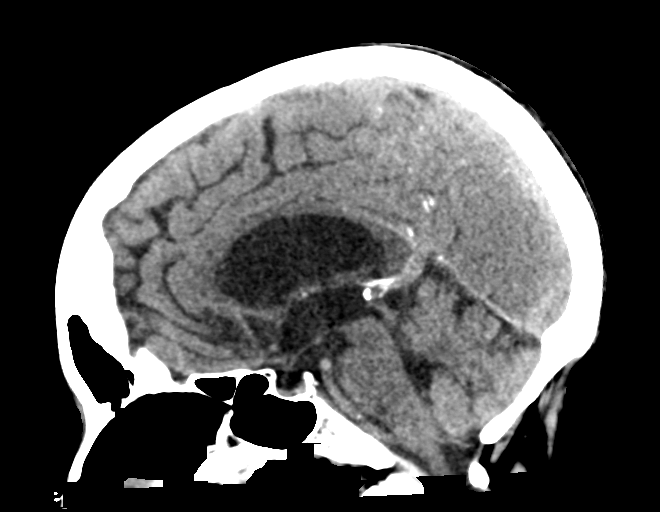
[im 32/51  brain]
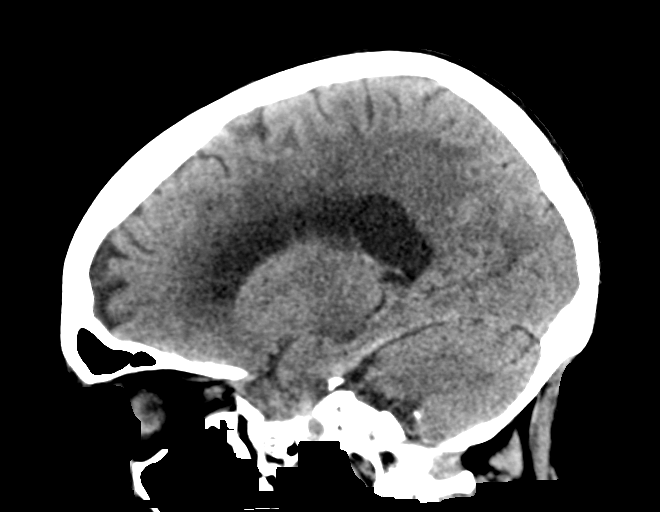

[17 of 47 positions shown; findings below may reference images not displayed]

FINDINGS: Brain: No acute territorial infarction, hemorrhage or intracranial
mass. Mild atrophy. Extensive chronic small-vessel ischemic changes
of the white matter. Chronic lacunar infarcts in the right basal
ganglia and adjacent white matter. Stable ventricle size.

Vascular: No hyperdense vessels. Vertebral and carotid vascular
calcification

Skull: Normal. Negative for fracture or focal lesion.

Sinuses/Orbits: No acute finding.

Other: None
IMPRESSION: 1. No CT evidence for acute intracranial abnormality.
2. Atrophy and chronic small vessel ischemic changes of the white
matter

## 2021-03-16 NOTE — ED Triage Notes (Signed)
Pt arrived by EMS from [facility] facility. Pt has unwitnessed fall today. Pt has some bruising on left hand, unsure if that is new or old  Pt had a fall last week and has some swelling on her right foot that family is also concerned about   Hx of dementia, was recently taken off tramadol due to staff thinking it is making her condition worse.  EMS reports CBG 143   Pt is sleepy and short in her responses. Denies pain at this time

## 2021-03-16 NOTE — ED Provider Notes (Signed)
Emergency Medicine Provider Triage Evaluation Note  Sandra Sheppard , a 75 y.o. female  was evaluated in triage.  Pt sent from nursing facility due to an unwitnessed fall.  Patient with dementia and unable to provide history.  Nobody with her at this time.  Has been experiencing increased falls per facility.  Review of Systems  Positive:  Negative: pain  Physical Exam  BP 107/79 (BP Location: Left Arm)   Pulse 77   Temp 98.4 F (36.9 C) (Oral)   Ht '5\' 5"'$  (1.651 m)   Wt 52.2 kg   SpO2 98%   BMI 19.14 kg/m  Gen:   Awake, no distress   Resp:  Normal effort  MSK:   Moves extremities without difficulty  Other:    Medical Decision Making  Medically screening exam initiated at 5:00 PM.  Appropriate orders placed.  Sandra Sheppard was informed that the remainder of the evaluation will be completed by another provider, this initial triage assessment does not replace that evaluation, and the importance of remaining in the ED until their evaluation is complete.     Darliss Ridgel 03/16/21 1701    Teressa Lower, MD 03/16/21 1914

## 2021-03-17 ENCOUNTER — Inpatient Hospital Stay (HOSPITAL_COMMUNITY): Payer: Medicare HMO

## 2021-03-17 ENCOUNTER — Emergency Department (HOSPITAL_COMMUNITY): Payer: Medicare HMO

## 2021-03-17 DIAGNOSIS — Z043 Encounter for examination and observation following other accident: Secondary | ICD-10-CM | POA: Diagnosis not present

## 2021-03-17 DIAGNOSIS — Z8673 Personal history of transient ischemic attack (TIA), and cerebral infarction without residual deficits: Secondary | ICD-10-CM | POA: Diagnosis not present

## 2021-03-17 DIAGNOSIS — F17210 Nicotine dependence, cigarettes, uncomplicated: Secondary | ICD-10-CM | POA: Diagnosis not present

## 2021-03-17 DIAGNOSIS — Z681 Body mass index (BMI) 19 or less, adult: Secondary | ICD-10-CM | POA: Diagnosis not present

## 2021-03-17 DIAGNOSIS — Z880 Allergy status to penicillin: Secondary | ICD-10-CM | POA: Diagnosis not present

## 2021-03-17 DIAGNOSIS — Z720 Tobacco use: Secondary | ICD-10-CM

## 2021-03-17 DIAGNOSIS — F03918 Unspecified dementia, unspecified severity, with other behavioral disturbance: Secondary | ICD-10-CM | POA: Diagnosis present

## 2021-03-17 DIAGNOSIS — E1122 Type 2 diabetes mellitus with diabetic chronic kidney disease: Secondary | ICD-10-CM | POA: Diagnosis not present

## 2021-03-17 DIAGNOSIS — Z952 Presence of prosthetic heart valve: Secondary | ICD-10-CM | POA: Diagnosis not present

## 2021-03-17 DIAGNOSIS — F419 Anxiety disorder, unspecified: Secondary | ICD-10-CM | POA: Diagnosis not present

## 2021-03-17 DIAGNOSIS — Y92129 Unspecified place in nursing home as the place of occurrence of the external cause: Secondary | ICD-10-CM | POA: Diagnosis not present

## 2021-03-17 DIAGNOSIS — R69 Illness, unspecified: Secondary | ICD-10-CM | POA: Diagnosis not present

## 2021-03-17 DIAGNOSIS — Z885 Allergy status to narcotic agent status: Secondary | ICD-10-CM | POA: Diagnosis not present

## 2021-03-17 DIAGNOSIS — N184 Chronic kidney disease, stage 4 (severe): Secondary | ICD-10-CM | POA: Diagnosis not present

## 2021-03-17 DIAGNOSIS — N390 Urinary tract infection, site not specified: Secondary | ICD-10-CM | POA: Diagnosis not present

## 2021-03-17 DIAGNOSIS — E785 Hyperlipidemia, unspecified: Secondary | ICD-10-CM | POA: Diagnosis not present

## 2021-03-17 DIAGNOSIS — R633 Feeding difficulties, unspecified: Secondary | ICD-10-CM | POA: Diagnosis present

## 2021-03-17 DIAGNOSIS — M7989 Other specified soft tissue disorders: Secondary | ICD-10-CM | POA: Diagnosis not present

## 2021-03-17 DIAGNOSIS — R296 Repeated falls: Secondary | ICD-10-CM | POA: Diagnosis not present

## 2021-03-17 DIAGNOSIS — N3000 Acute cystitis without hematuria: Secondary | ICD-10-CM | POA: Diagnosis not present

## 2021-03-17 DIAGNOSIS — Z20822 Contact with and (suspected) exposure to covid-19: Secondary | ICD-10-CM | POA: Diagnosis not present

## 2021-03-17 DIAGNOSIS — S199XXA Unspecified injury of neck, initial encounter: Secondary | ICD-10-CM | POA: Diagnosis not present

## 2021-03-17 DIAGNOSIS — I480 Paroxysmal atrial fibrillation: Secondary | ICD-10-CM

## 2021-03-17 DIAGNOSIS — R443 Hallucinations, unspecified: Secondary | ICD-10-CM | POA: Diagnosis not present

## 2021-03-17 DIAGNOSIS — W050XXA Fall from non-moving wheelchair, initial encounter: Secondary | ICD-10-CM | POA: Diagnosis not present

## 2021-03-17 DIAGNOSIS — I700 Atherosclerosis of aorta: Secondary | ICD-10-CM | POA: Diagnosis not present

## 2021-03-17 DIAGNOSIS — G9341 Metabolic encephalopathy: Secondary | ICD-10-CM

## 2021-03-17 DIAGNOSIS — W19XXXA Unspecified fall, initial encounter: Secondary | ICD-10-CM | POA: Diagnosis present

## 2021-03-17 DIAGNOSIS — Z8249 Family history of ischemic heart disease and other diseases of the circulatory system: Secondary | ICD-10-CM | POA: Diagnosis not present

## 2021-03-17 DIAGNOSIS — S92911A Unspecified fracture of right toe(s), initial encounter for closed fracture: Secondary | ICD-10-CM | POA: Diagnosis not present

## 2021-03-17 DIAGNOSIS — N179 Acute kidney failure, unspecified: Secondary | ICD-10-CM | POA: Diagnosis not present

## 2021-03-17 DIAGNOSIS — E860 Dehydration: Secondary | ICD-10-CM | POA: Diagnosis not present

## 2021-03-17 DIAGNOSIS — Z882 Allergy status to sulfonamides status: Secondary | ICD-10-CM | POA: Diagnosis not present

## 2021-03-17 DIAGNOSIS — I120 Hypertensive chronic kidney disease with stage 5 chronic kidney disease or end stage renal disease: Secondary | ICD-10-CM | POA: Diagnosis not present

## 2021-03-17 DIAGNOSIS — N189 Chronic kidney disease, unspecified: Secondary | ICD-10-CM | POA: Diagnosis not present

## 2021-03-17 DIAGNOSIS — E1151 Type 2 diabetes mellitus with diabetic peripheral angiopathy without gangrene: Secondary | ICD-10-CM | POA: Diagnosis not present

## 2021-03-17 LAB — URINALYSIS, ROUTINE W REFLEX MICROSCOPIC
Bilirubin Urine: NEGATIVE
Glucose, UA: NEGATIVE mg/dL
Hgb urine dipstick: NEGATIVE
Ketones, ur: 5 mg/dL — AB
Nitrite: NEGATIVE
Protein, ur: 30 mg/dL — AB
Specific Gravity, Urine: 1.018 (ref 1.005–1.030)
WBC, UA: >50 WBC/hpf — ABNORMAL HIGH (ref 0–5)
pH: 5 (ref 5.0–8.0)

## 2021-03-17 LAB — TROPONIN I (HIGH SENSITIVITY)
Troponin I (High Sensitivity): 17 ng/L (ref <18)
Troponin I (High Sensitivity): 15 ng/L (ref <18)

## 2021-03-17 LAB — CBG MONITORING, ED: Glucose-Capillary: 136 mg/dL — ABNORMAL HIGH (ref 70–99)

## 2021-03-17 LAB — RESP PANEL BY RT-PCR (FLU A&B, COVID) ARPGX2
Influenza A by PCR: NEGATIVE
Influenza B by PCR: NEGATIVE
SARS Coronavirus 2 by RT PCR: NEGATIVE

## 2021-03-17 IMAGING — DX DG CHEST 1V PORT
1 series · 1 of 1 positions shown · non-contrast
Comparison: Chest x-ray [DATE].

CLINICAL DATA: 75-year-old female with history of unwitnessed fall
yesterday.

EXAM:
PORTABLE CHEST 1 VIEW

[chest ap]
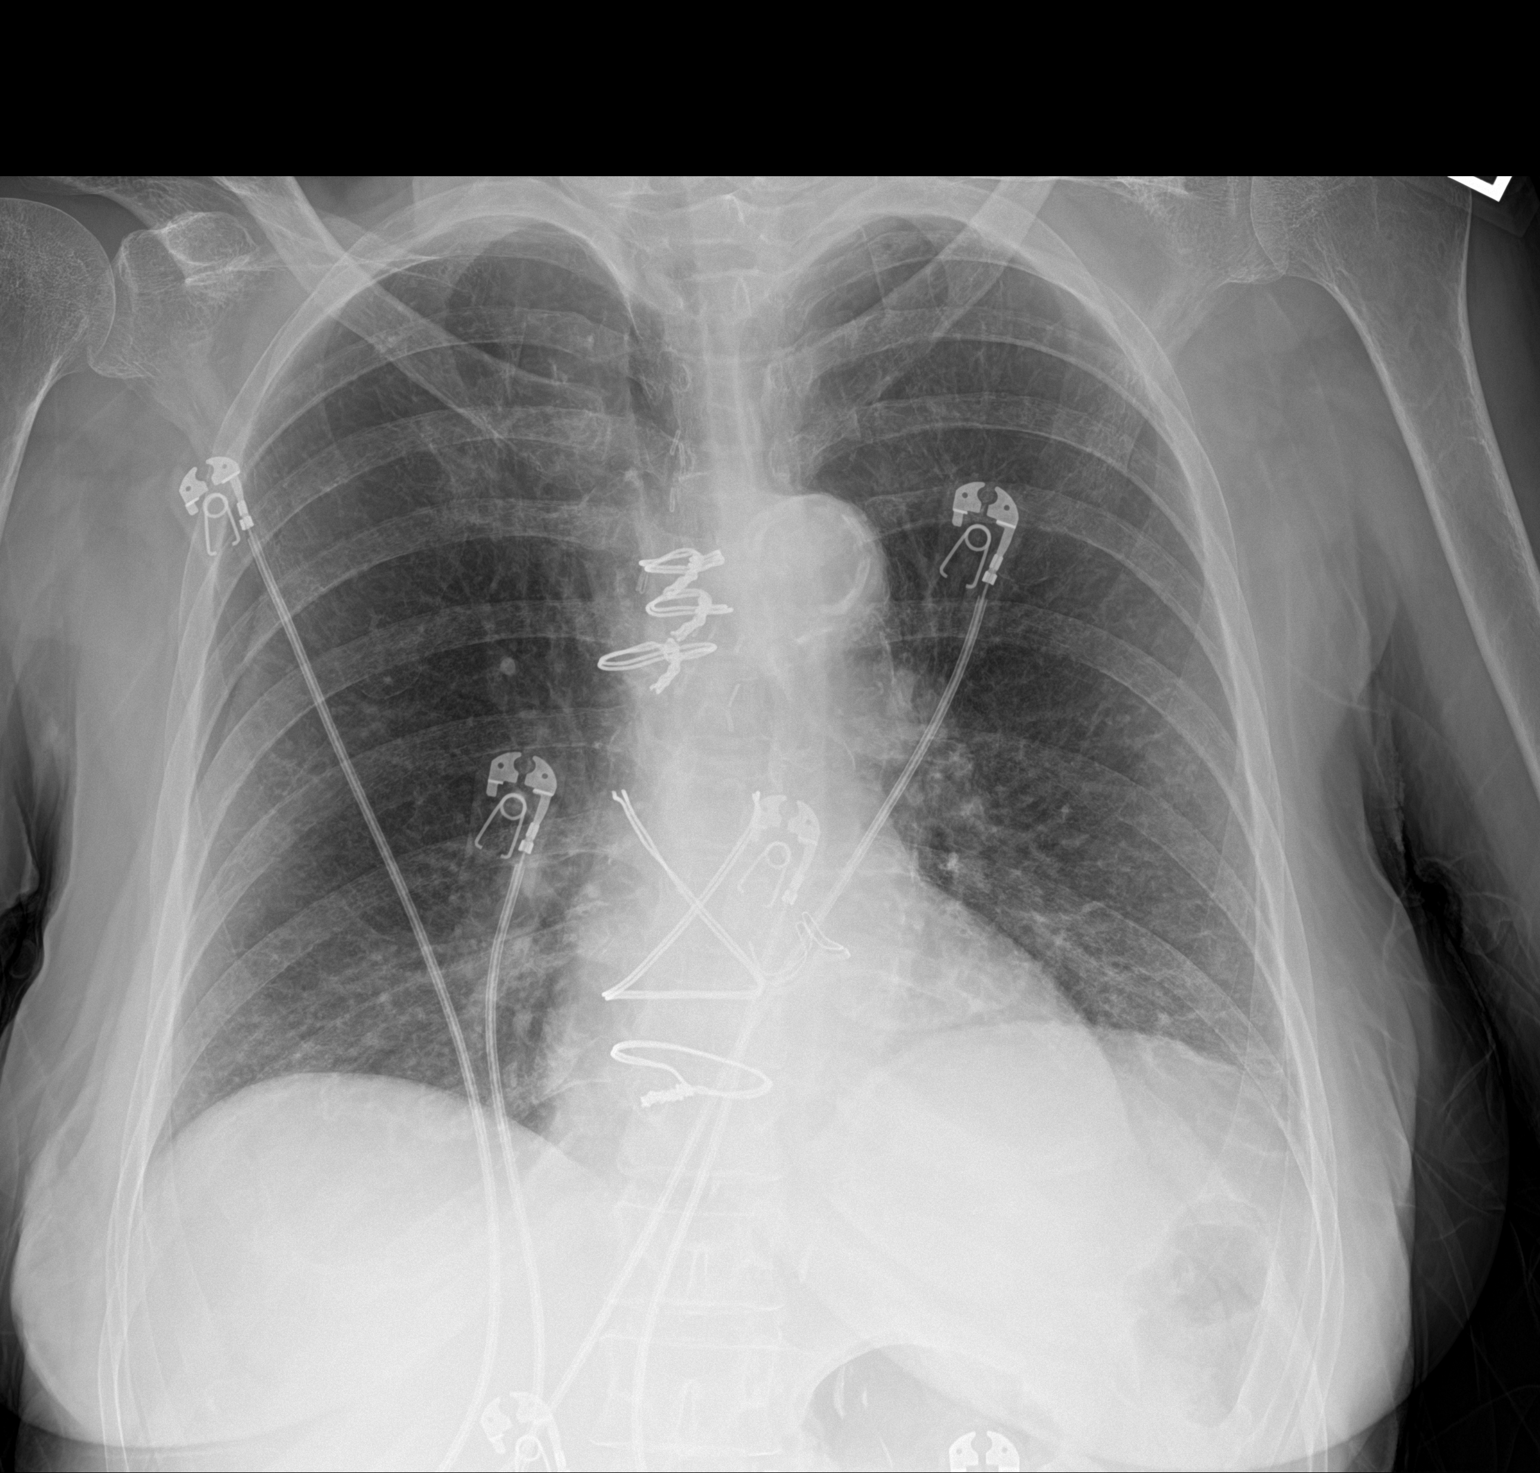

[1 of 1 positions shown; findings below may reference images not displayed]

FINDINGS: Lung volumes are low. Mild diffuse interstitial prominence
throughout the mid to lower lungs bilaterally, similar to the prior
examination no consolidative airspace disease. No pleural effusions.
No pneumothorax. No pulmonary nodule or mass noted. Pulmonary
vasculature and the cardiomediastinal silhouette are within normal
limits. Atherosclerotic calcifications in the thoracic aorta. Status
post median sternotomy for aortic valve replacement with what
appears to be a stented bioprosthesis.
IMPRESSION: 1. Low lung volumes without radiographic evidence of acute
cardiopulmonary disease.
2. Aortic atherosclerosis.

## 2021-03-17 IMAGING — DX DG FOOT 2V*R*
2 series · 2 of 2 positions shown · non-contrast
Comparison: None.

CLINICAL DATA: Unwitnessed fall.

EXAM:
RIGHT FOOT - 2 VIEW

[foot]
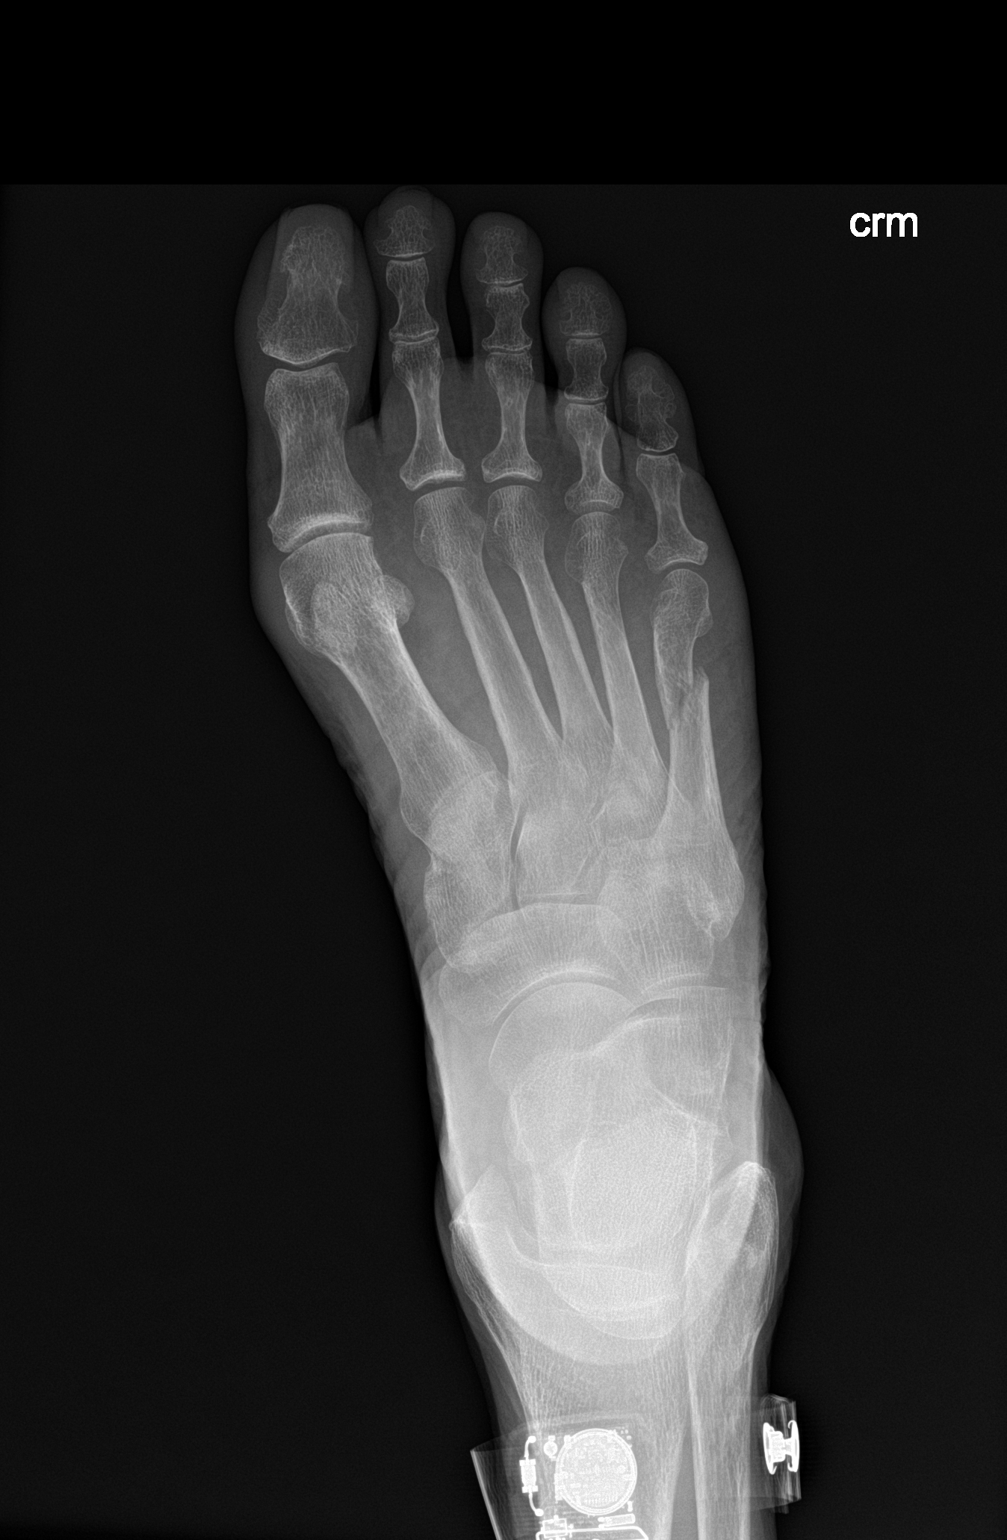

[leg]
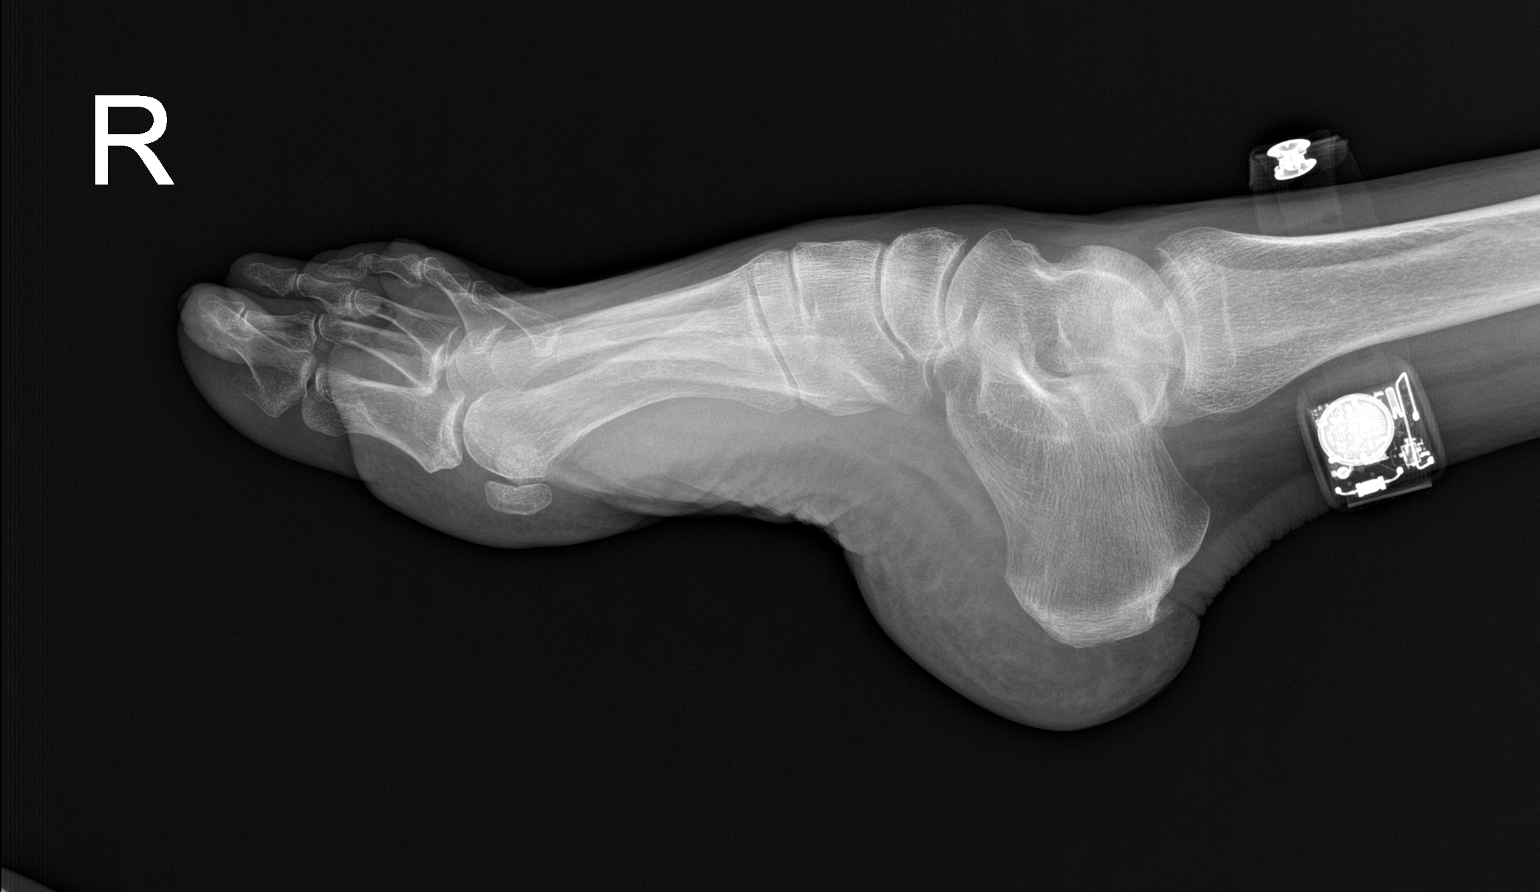

[2 of 2 positions shown; findings below may reference images not displayed]

FINDINGS: An acute fracture is seen involving the mid portion of the fifth
right metatarsal. There is no evidence of dislocation. There is no
evidence of arthropathy or other focal bone abnormality. Soft tissue
swelling is seen adjacent to the previously noted fracture site.
IMPRESSION: Acute fracture of the fifth right metatarsal.

## 2021-03-17 IMAGING — DX DG PORTABLE PELVIS
1 series · 1 of 1 positions shown · non-contrast
Comparison: No priors.

CLINICAL DATA: 75-year-old female with history of unwitnessed fall
yesterday.

EXAM:
PORTABLE PELVIS 1-2 VIEWS

[pelvis ap]
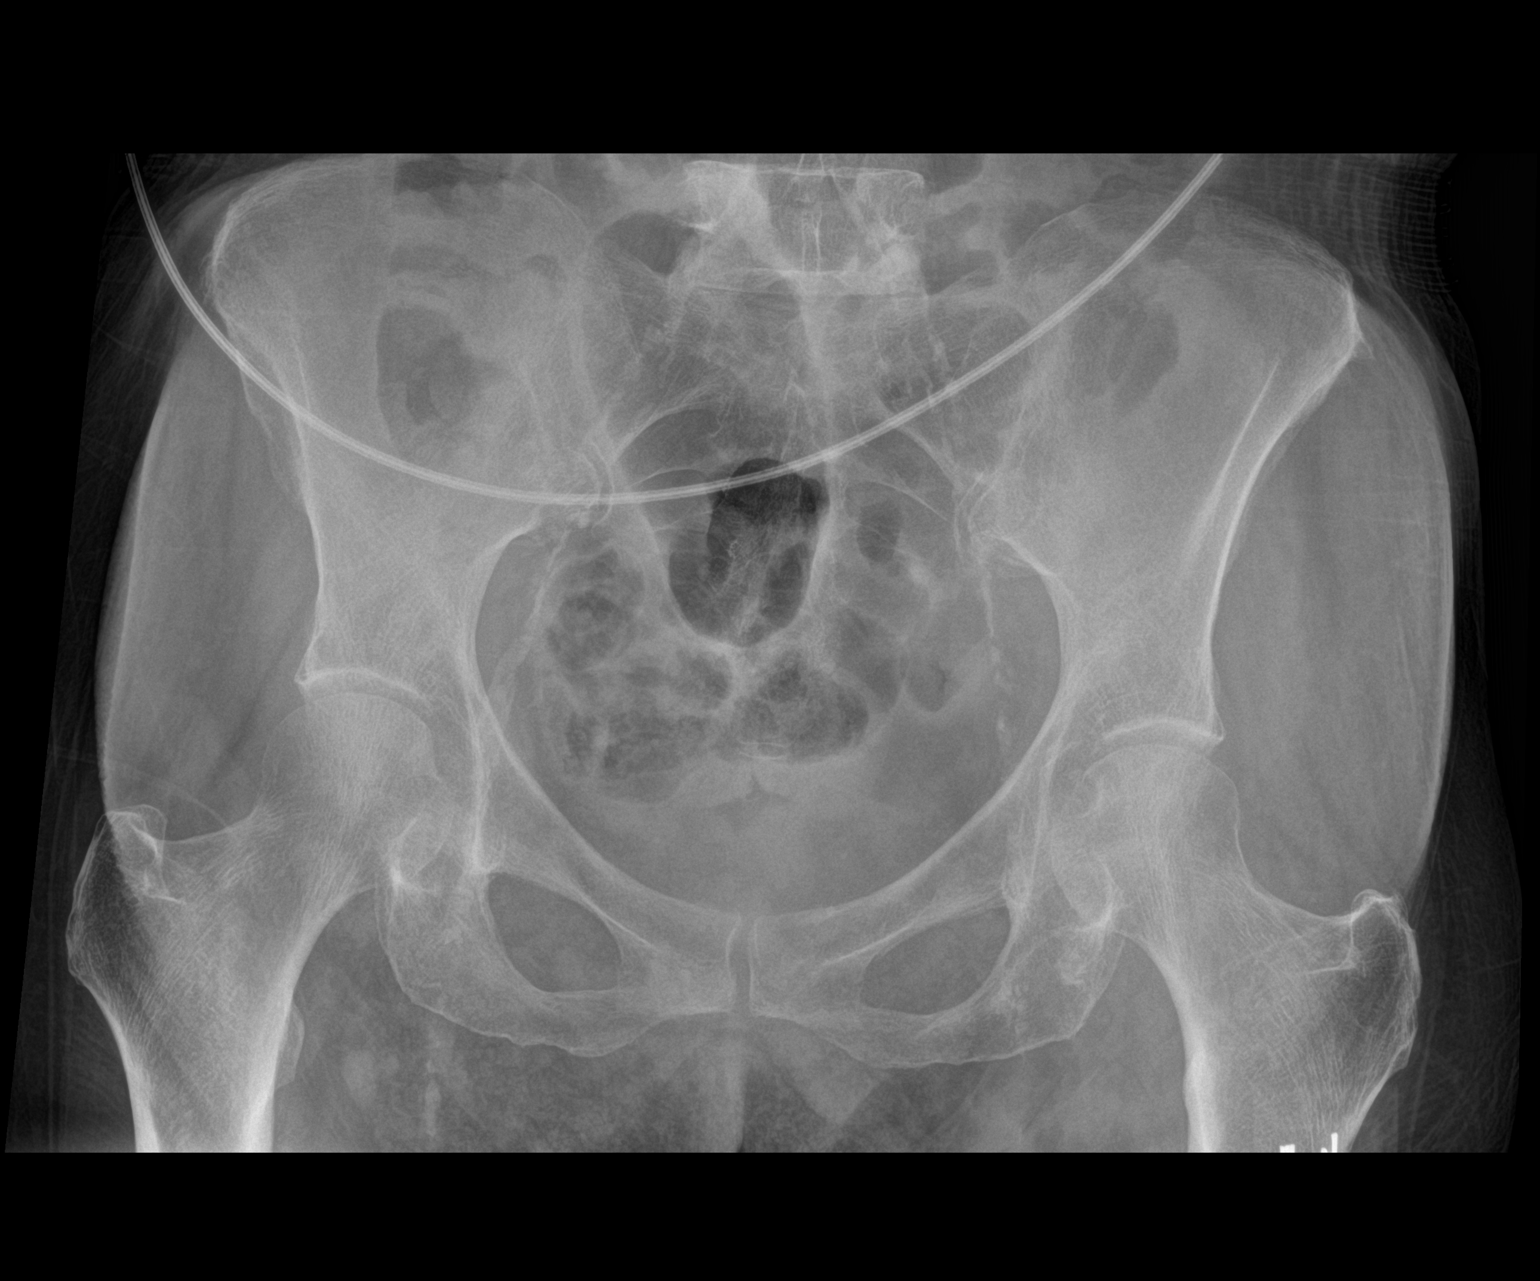

[1 of 1 positions shown; findings below may reference images not displayed]

FINDINGS: There is no evidence of pelvic fracture or diastasis. No pelvic bone
lesions are seen. Mild joint space narrowing, subchondral sclerosis
and osteophyte formation is noted in the hip joints bilaterally,
indicative of osteoarthritis. Atherosclerotic calcifications in the
pelvic vasculature.
IMPRESSION: 1. No radiographic evidence of significant acute traumatic injury to
the bony pelvis.

## 2021-03-17 MED ORDER — DICLOFENAC SODIUM 1 % EX GEL
2.0000 g | Freq: Two times a day (BID) | CUTANEOUS | Status: DC
  Administered 2021-03-18 – 2021-03-23 (×10): 2 g via TOPICAL
  Filled 2021-03-17: qty 100

## 2021-03-17 MED ORDER — SODIUM CHLORIDE 0.9% FLUSH
3.0000 mL | Freq: Two times a day (BID) | INTRAVENOUS | Status: DC
  Administered 2021-03-17 – 2021-03-23 (×8): 3 mL via INTRAVENOUS

## 2021-03-17 MED ORDER — MECLIZINE HCL 25 MG PO TABS: 12.5000 mg | ORAL_TABLET | Freq: Three times a day (TID) | ORAL | Status: DC | PRN

## 2021-03-17 MED ORDER — QUETIAPINE FUMARATE 25 MG PO TABS
25.0000 mg | ORAL_TABLET | Freq: Every day | ORAL | Status: DC
  Administered 2021-03-18 – 2021-03-22 (×5): 25 mg via ORAL
  Filled 2021-03-17 (×6): qty 1

## 2021-03-17 MED ORDER — SODIUM CHLORIDE 0.9 % IV SOLN
1.0000 g | Freq: Once | INTRAVENOUS | Status: AC
  Administered 2021-03-17: 1 g via INTRAVENOUS
  Filled 2021-03-17: qty 10

## 2021-03-17 MED ORDER — ROSUVASTATIN CALCIUM 5 MG PO TABS
5.0000 mg | ORAL_TABLET | Freq: Every day | ORAL | Status: DC
  Administered 2021-03-18 – 2021-03-23 (×6): 5 mg via ORAL
  Filled 2021-03-17 (×6): qty 1

## 2021-03-17 MED ORDER — ACETAMINOPHEN 325 MG PO TABS
650.0000 mg | ORAL_TABLET | Freq: Four times a day (QID) | ORAL | Status: DC | PRN
  Administered 2021-03-20: 650 mg via ORAL
  Filled 2021-03-17: qty 2

## 2021-03-17 MED ORDER — SODIUM CHLORIDE 0.9 % IV SOLN
1.0000 g | INTRAVENOUS | Status: DC
  Administered 2021-03-18 – 2021-03-21 (×4): 1 g via INTRAVENOUS
  Filled 2021-03-17 (×4): qty 10

## 2021-03-17 MED ORDER — SODIUM CHLORIDE 0.9 % IV SOLN: INTRAVENOUS | Status: DC

## 2021-03-17 MED ORDER — ASPIRIN EC 81 MG PO TBEC
81.0000 mg | DELAYED_RELEASE_TABLET | Freq: Every day | ORAL | Status: DC
  Administered 2021-03-18 – 2021-03-23 (×6): 81 mg via ORAL
  Filled 2021-03-17 (×6): qty 1

## 2021-03-17 MED ORDER — AMLODIPINE BESYLATE 5 MG PO TABS
5.0000 mg | ORAL_TABLET | Freq: Every day | ORAL | Status: DC
  Administered 2021-03-18: 5 mg via ORAL
  Filled 2021-03-17: qty 1

## 2021-03-17 MED ORDER — ENSURE ENLIVE PO LIQD
237.0000 mL | Freq: Three times a day (TID) | ORAL | Status: DC
  Administered 2021-03-18 – 2021-03-23 (×11): 237 mL via ORAL

## 2021-03-17 MED ORDER — LORATADINE 10 MG PO TABS: 10.0000 mg | ORAL_TABLET | Freq: Every day | ORAL | Status: DC | PRN

## 2021-03-17 MED ORDER — DIVALPROEX SODIUM 125 MG PO DR TAB
125.0000 mg | DELAYED_RELEASE_TABLET | Freq: Two times a day (BID) | ORAL | Status: DC
  Administered 2021-03-18 – 2021-03-23 (×11): 125 mg via ORAL
  Filled 2021-03-17 (×13): qty 1

## 2021-03-17 MED ORDER — DIPHENHYDRAMINE HCL 50 MG/ML IJ SOLN
25.0000 mg | Freq: Once | INTRAMUSCULAR | Status: AC
  Administered 2021-03-17: 25 mg via INTRAVENOUS
  Filled 2021-03-17: qty 1

## 2021-03-17 MED ORDER — SENNOSIDES-DOCUSATE SODIUM 8.6-50 MG PO TABS
1.0000 | ORAL_TABLET | Freq: Every day | ORAL | Status: DC
  Administered 2021-03-18 – 2021-03-22 (×5): 1 via ORAL
  Filled 2021-03-17 (×5): qty 1

## 2021-03-17 MED ORDER — MELATONIN 5 MG PO TABS
10.0000 mg | ORAL_TABLET | Freq: Every day | ORAL | Status: DC
  Administered 2021-03-18 – 2021-03-22 (×5): 10 mg via ORAL
  Filled 2021-03-17 (×5): qty 2

## 2021-03-17 MED ORDER — FENOFIBRATE 54 MG PO TABS
54.0000 mg | ORAL_TABLET | Freq: Every day | ORAL | Status: DC
  Administered 2021-03-18 – 2021-03-23 (×6): 54 mg via ORAL
  Filled 2021-03-17 (×7): qty 1

## 2021-03-17 MED ORDER — ACETAMINOPHEN 325 MG PO TABS: 650.0000 mg | ORAL_TABLET | ORAL | Status: DC | PRN

## 2021-03-17 MED ORDER — HEPARIN SODIUM (PORCINE) 5000 UNIT/ML IJ SOLN
5000.0000 [IU] | Freq: Three times a day (TID) | INTRAMUSCULAR | Status: DC
  Administered 2021-03-17: 5000 [IU] via SUBCUTANEOUS
  Filled 2021-03-17: qty 1

## 2021-03-17 MED ORDER — HEPARIN SODIUM (PORCINE) 5000 UNIT/ML IJ SOLN
5000.0000 [IU] | Freq: Three times a day (TID) | INTRAMUSCULAR | Status: DC
  Administered 2021-03-18 – 2021-03-23 (×10): 5000 [IU] via SUBCUTANEOUS
  Filled 2021-03-17 (×11): qty 1

## 2021-03-17 MED ORDER — ACETAMINOPHEN 650 MG RE SUPP: 650.0000 mg | Freq: Four times a day (QID) | RECTAL | Status: DC | PRN

## 2021-03-17 MED ORDER — ESCITALOPRAM OXALATE 10 MG PO TABS
5.0000 mg | ORAL_TABLET | Freq: Every day | ORAL | Status: DC
  Administered 2021-03-18 – 2021-03-23 (×6): 5 mg via ORAL
  Filled 2021-03-17 (×6): qty 1

## 2021-03-17 MED ORDER — ALBUTEROL SULFATE (2.5 MG/3ML) 0.083% IN NEBU: 2.5000 mg | INHALATION_SOLUTION | Freq: Four times a day (QID) | RESPIRATORY_TRACT | Status: DC | PRN

## 2021-03-17 NOTE — ED Notes (Signed)
Not able to do orfhostatics due to patient's confusion and refusal to cooperate.

## 2021-03-17 NOTE — ED Notes (Signed)
Lunch ordered 

## 2021-03-17 NOTE — H&P (Signed)
History and Physical    Sandra Sheppard [id number] DOB: 05-19-1946 DOA: 03/16/2021  Referring MD/NP/PA: Benard Halsted, PA-C PCP: Prince Solian, MD  Patient coming from: [facility] via EMS  Chief Complaint: Fall  I have personally briefly reviewed patient's old medical records in [place]   HPI: Sandra Sheppard is a 75 y.o. female with medical history significant of paroxysmal atrial fibrillation, hypertension, CKD stage IV, and dementia who presents after having an unwitnessed fall yesterday.  It is unclear if the patient tried to get herself out of the wheelchair or possibly passed out.  Patient is unable to give any significant history due to her dementia.  Her sister is present at bedside and helps provide additional history.  Normally patient is able to recognize family and is able to commands.  However, recently patient had been more disoriented with increased hallucinations and repetitive questioning.   Her sister noted that she had been unable to feed herself and that similar things that happened previously in the past related with urinary tract infections.  She has had frequent falls with the last one being last week where she was reported to have broken her right toes.  At the facility her sister notes that they had been giving her tramadol which makes her loopy.  Her sister does make note that if her kidney function fails that she is not want to be on dialysis.  Patient had last been hospitalized in the hospital in March of this year after having suspected syncopal episode and collapsed related to orthostatic hypotension.  ED Course: On admission to the emergency department patient was seen to be afebrile with blood pressures elevated up to 175/89, and all other vital signs maintained.  CT scan of the head and cervical spine did not note any acute abnormalities.  Labs significant for BUN 41, creatinine 2.74, and high-sensitivity troponin negative.  Chest x-ray noted low lung  volumes without any acute abnormality and pelvic x-rays did not show any acute fractures.  Urinalysis was significant for large leukocytes, many bacteria, and greater than 50 WBCs.  Urine cultures have been obtained and patient was started on empiric antibiotics of Rocephin.  Heparin COVID-19 screening had not been ordered yet.   Review of Systems  Unable to perform ROS: Dementia  Psychiatric/Behavioral:  Positive for hallucinations and memory loss.    Past Medical History:  Diagnosis Date   ABDOMINAL AORTIC ANEURYSM    Atrial fibrillation (HCC)    CAROTID STENOSIS    CKD (chronic kidney disease), stage V (HCC)    Complication of anesthesia    Confusion    CVD (cardiovascular disease)    Diabetes ([place])    no meds at present time   FIBRILLATION, ATRIAL    HYPERCHOLESTEROLEMIA  IIA    HYPERTENSION, MILD    MURMUR    Osteoporosis    PERIPHERAL VASCULAR DISEASE    PONV (postoperative nausea and vomiting)    STENOSIS, MITRAL AND AORTIC VALVES     Past Surgical History:  Procedure Laterality Date   AORTIC VALVE REPLACEMENT     08/14/2008.  Dr. Roxy Manns   MYRINGOTOMY WITH TUBE PLACEMENT Bilateral 03/01/2019   Procedure: MYRINGOTOMY WITH  T TUBE PLACEMENT;  Surgeon: Leta Baptist, MD;  Location: [facility];  Service: ENT;  Laterality: Bilateral;   TOTAL ABDOMINAL HYSTERECTOMY       reports that she has quit smoking. She smoked an average of .25 packs per day. She has never used smokeless  tobacco. She reports that she does not drink alcohol and does not use drugs.  Allergies  Allergen Reactions   Ativan [Lorazepam] Other (See Comments)    Confusion   Codeine Other (See Comments)    Rash, Nausea and Vomiting    Sulfonamide Derivatives Other (See Comments)    Unspecified   Penicillins Rash    Has patient had a PCN reaction causing immediate rash, facial/tongue/throat swelling, SOB or lightheadedness with hypotension: YES Has patient had a PCN reaction causing severe rash  involving mucus membranes or skin necrosis:NO Has patient had a PCN reaction that required hospitalization NO Has patient had a PCN reaction occurring within the last 10 years: NO If all of the above answers are "NO", then may proceed with Cephalosporin use.    Family History  Problem Relation Age of Onset   Colon polyps Other    Heart disease Mother    Heart disease Father    Heart disease Brother     Prior to Admission medications   Medication Sig Start Date End Date Taking? Authorizing Provider  acetaminophen (TYLENOL) 325 MG tablet Take 650 mg by mouth every 4 (four) hours as needed for mild pain. Do not exceed 3,000 mg in 24 hrs   Yes [provider]  amLODipine (NORVASC) 5 MG tablet Take 1 tablet (5 mg total) by mouth daily. Patient taking differently: Take 5 mg by mouth daily. 0900 09/01/20 03/17/21 Yes Shah, Pratik D, DO  aspirin 81 MG tablet Take 81 mg by mouth daily. 0900   Yes [provider]  Cholecalciferol (VITAMIN D-3) 1000 UNITS CAPS Take 2,000 Units by mouth daily. 0900   Yes [provider]  Choline Fenofibrate 45 MG capsule Take 45 mg by mouth daily. 0900   Yes [provider]  divalproex (DEPAKOTE) 125 MG DR tablet Take 125 mg by mouth 2 (two) times daily. 0900, 1700   Yes [provider]  docusate sodium (COLACE) 100 MG capsule Take 1 capsule (100 mg total) by mouth 2 (two) times daily. Patient taking differently: Take 100 mg by mouth 2 (two) times daily. 0900, 1800 08/31/18  Yes Hongalgi, Lenis Dickinson, MD  escitalopram (LEXAPRO) 5 MG tablet Take 5 mg by mouth daily. 0900 08/07/20  Yes [provider]  loratadine (CLARITIN) 10 MG tablet Take 10 mg by mouth daily as needed for allergies.   Yes [provider]  meclizine (ANTIVERT) 12.5 MG tablet Take 12.5 mg by mouth 3 (three) times daily as needed for dizziness.   Yes [provider]  Melatonin 10 MG TABS Take 10 mg by mouth at bedtime. 2100   Yes  [provider]  Multiple Vitamin (MULTIVITAMIN) capsule Take 1 capsule by mouth daily. 0900   Yes [provider]  Multiple Vitamins-Minerals (PRESERVISION AREDS 2+MULTI VIT) CAPS Take 1 capsule by mouth daily at 6 (six) AM. 0900   Yes [provider]  OVER THE COUNTER MEDICATION Take 237 mLs by mouth 3 (three) times daily between meals. House Supplement for nutritional support. 0900, 1400, 2100.   Yes [provider]  QUEtiapine (SEROQUEL) 25 MG tablet Take 1 tablet (25 mg total) by mouth at bedtime. Patient taking differently: Take 25 mg by mouth at bedtime. 2100 09/01/20  Yes Shah, Pratik D, DO  rosuvastatin (CRESTOR) 5 MG tablet Take 5 mg by mouth daily. 0900   Yes [provider]  senna-docusate (SENOKOT-S) 8.6-50 MG tablet Take 1 tablet by mouth at bedtime. Patient taking differently:  Take 1 tablet by mouth at bedtime. 2100 09/01/20  Yes Shah, Pratik D, DO  traMADol (ULTRAM) 50 MG tablet Take 50 mg by mouth every 8 (eight) hours as needed for moderate pain.   Yes [provider]  vitamin B-12 1000 MCG tablet Take 1 tablet (1,000 mcg total) by mouth daily. Patient taking differently: Take 1,000 mcg by mouth daily. 0900 08/31/18  Yes Hongalgi, Lenis Dickinson, MD  feeding supplement (ENSURE ENLIVE / ENSURE PLUS) LIQD Take 237 mLs by mouth 3 (three) times daily between meals. Patient not taking: No sig reported 09/01/20   Manuella Ghazi, Pratik D, DO  nicotine (NICODERM CQ - DOSED IN MG/24 HOURS) 21 mg/24hr patch Place 1 patch (21 mg total) onto the skin daily. Patient not taking: No sig reported 09/01/20   Heath Lark D, DO  zoledronic acid (RECLAST) 5 MG/100ML SOLN injection Inject 5 mg into the vein See admin instructions. Once a year    [provider]    Physical Exam:  Constitutional: Elderly female who appears to be in some distress stating she needs to urinate Vitals:   03/17/21 0656 03/17/21 0700 03/17/21 0835 03/17/21 0935  BP: (!) 172/73  (!) 175/89 (!) 148/69 (!) 154/71  Pulse: 79 80 74 76  Resp: '14 13 18 19  '$ Temp:      TempSrc:      SpO2: 98% 98% 95% 99%  Weight:      Height:       Eyes: PERRL, lids and conjunctivae normal ENMT: Mucous membranes are moist. Posterior pharynx clear of any exudate or lesions.  Neck: normal, supple, no masses, no thyromegaly Respiratory: clear to auscultation bilaterally no significant crackles appreciated.  O2 saturations maintained on room air. Cardiovascular: Regular rate and rhythm, no murmurs / rubs / gallops. No extremity edema. 2+ pedal pulses. No carotid bruits.  Abdomen: no tenderness, no masses palpated. No hepatosplenomegaly. Bowel sounds positive.  Musculoskeletal: no clubbing / cyanosis.  Skin: Poor skin turgor.  Bruising noted of the right forefoot Neurologic: CN 2-12 grossly intact. Sensation intact, DTR normal. Strength 5/5 in all 4.  Psychiatric: Confused alert and oriented to self.  Patient reported seeing a cat walking by in the room that was not there.      Labs on Admission: I have personally reviewed following labs and imaging studies  CBC: Recent Labs  Lab 03/16/21 1708  WBC 8.7  NEUTROABS 6.3  HGB 12.1  HCT 37.6  MCV 93.3  PLT 0000000   Basic Metabolic Panel: Recent Labs  Lab 03/16/21 1708  NA 137  K 4.5  CL 106  CO2 20*  GLUCOSE 135*  BUN 41*  CREATININE 2.74*  CALCIUM 9.3   GFR: Estimated Creatinine Clearance: 14.4 mL/min (A) (by C-G formula based on SCr of 2.74 mg/dL (H)). Liver Function Tests: Recent Labs  Lab 03/16/21 1708  AST 17  ALT 11  ALKPHOS 63  BILITOT 0.9  PROT 7.3  ALBUMIN 3.8   No results for input(s): LIPASE, AMYLASE in the last 168 hours. No results for input(s): AMMONIA in the last 168 hours. Coagulation Profile: No results for input(s): INR, PROTIME in the last 168 hours. Cardiac Enzymes: No results for input(s): CKTOTAL, CKMB, CKMBINDEX, TROPONINI in the last 168 hours. BNP (last 3 results) No results for  input(s): PROBNP in the last 8760 hours. HbA1C: No results for input(s): HGBA1C in the last 72 hours. CBG: Recent Labs  Lab 03/17/21 0731  GLUCAP 136*   Lipid Profile: No  results for input(s): CHOL, HDL, LDLCALC, TRIG, CHOLHDL, LDLDIRECT in the last 72 hours. Thyroid Function Tests: No results for input(s): TSH, T4TOTAL, FREET4, T3FREE, THYROIDAB in the last 72 hours. Anemia Panel: No results for input(s): VITAMINB12, FOLATE, FERRITIN, TIBC, IRON, RETICCTPCT in the last 72 hours. Urine analysis:    Component Value Date/Time   COLORURINE AMBER (A) 03/17/2021 0135   APPEARANCEUR CLOUDY (A) 03/17/2021 0135   LABSPEC 1.018 03/17/2021 0135   PHURINE 5.0 03/17/2021 0135   GLUCOSEU NEGATIVE 03/17/2021 0135   HGBUR NEGATIVE 03/17/2021 0135   BILIRUBINUR NEGATIVE 03/17/2021 0135   KETONESUR 5 (A) 03/17/2021 0135   PROTEINUR 30 (A) 03/17/2021 0135   UROBILINOGEN 0.2 01/14/2010 1308   NITRITE NEGATIVE 03/17/2021 0135   LEUKOCYTESUR LARGE (A) 03/17/2021 0135   Sepsis Labs: No results found for this or any previous visit (from the past 240 hour(s)).   Radiological Exams on Admission: CT Head Wo Contrast  Result Date: 03/16/2021 CLINICAL DATA:  Head trauma EXAM: CT HEAD WITHOUT CONTRAST TECHNIQUE: Contiguous axial images were obtained from the base of the skull through the vertex without intravenous contrast. COMPARISON:  CT brain 11/06/2020 FINDINGS: Brain: No acute territorial infarction, hemorrhage or intracranial mass. Mild atrophy. Extensive chronic small-vessel ischemic changes of the white matter. Chronic lacunar infarcts in the right basal ganglia and adjacent white matter. Stable ventricle size. Vascular: No hyperdense vessels. Vertebral and carotid vascular calcification Skull: Normal. Negative for fracture or focal lesion. Sinuses/Orbits: No acute finding. Other: None IMPRESSION: 1. No CT evidence for acute intracranial abnormality. 2. Atrophy and chronic small vessel ischemic  changes of the white matter Electronically Signed   By: Donavan Foil M.D.   On: 03/16/2021 18:19   CT Cervical Spine Wo Contrast  Result Date: 03/17/2021 CLINICAL DATA:  75 year old female with history of trauma from a fall. EXAM: CT CERVICAL SPINE WITHOUT CONTRAST TECHNIQUE: Multidetector CT imaging of the cervical spine was performed without intravenous contrast. Multiplanar CT image reconstructions were also generated. COMPARISON:  Cervical spine CT 11/03/2020. FINDINGS: Alignment: Normal. Skull base and vertebrae: No acute fracture. No primary bone lesion or focal pathologic process. Soft tissues and spinal canal: No prevertebral fluid or swelling. No visible canal hematoma. Disc levels: Mild multilevel degenerative disc disease, most apparent at C4-C5. Mild multilevel facet arthropathy. Upper chest: Emphysema. Other: None. IMPRESSION: 1. No evidence of significant acute traumatic injury to the cervical spine. 2. Mild multilevel degenerative disc disease and cervical spondylosis, as above. 3. Emphysema. Electronically Signed   By: Vinnie Langton M.D.   On: 03/17/2021 08:41   DG Pelvis Portable  Result Date: 03/17/2021 CLINICAL DATA:  75 year old female with history of unwitnessed fall yesterday. EXAM: PORTABLE PELVIS 1-2 VIEWS COMPARISON:  No priors. FINDINGS: There is no evidence of pelvic fracture or diastasis. No pelvic bone lesions are seen. Mild joint space narrowing, subchondral sclerosis and osteophyte formation is noted in the hip joints bilaterally, indicative of osteoarthritis. Atherosclerotic calcifications in the pelvic vasculature. IMPRESSION: 1. No radiographic evidence of significant acute traumatic injury to the bony pelvis. Electronically Signed   By: Vinnie Langton M.D.   On: 03/17/2021 07:40   DG Chest Portable 1 View  Result Date: 03/17/2021 CLINICAL DATA:  75 year old female with history of unwitnessed fall yesterday. EXAM: PORTABLE CHEST 1 VIEW COMPARISON:  Chest x-ray  08/13/2020. FINDINGS: Lung volumes are low. Mild diffuse interstitial prominence throughout the mid to lower lungs bilaterally, similar to the prior examination no consolidative airspace disease. No pleural effusions. No  pneumothorax. No pulmonary nodule or mass noted. Pulmonary vasculature and the cardiomediastinal silhouette are within normal limits. Atherosclerotic calcifications in the thoracic aorta. 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 IMPRESSION: 1. Low lung volumes without radiographic evidence of acute cardiopulmonary disease. 2. Aortic atherosclerosis. Electronically Signed   By: Vinnie Langton M.D.   On: 03/17/2021 07:43    EKG: Independently reviewed.  Sinus arrhythmia at 72 beats  Assessment/Plan Urinary tract infection: Acute.  Patient was noted to have abnormal urinalysis concerning for infection.  Patient had been started on empiric antibiotics of Rocephin. -Admit to a medical telemetry -Follow-up urine culture -Continue Rocephin IV  Recurrent fall: At this time it is not clear cause patient to fall out of her wheelchair yesterday.  History of paroxysmal atrial fibrillation, but appears rate controlled.  During prior hospitalization in March fall was thought possibly related to syncope and orthostatic hypotension. -Set bed alarm -Follow-up telemetry overnight -PT/OT to evaluate and treat  Acute metabolic encephalopathy: Patient noted to be more altered with report of increased hallucinations.  CT scan of the brain and cervical spine showed no acute abnormalities at the facility patient reportedly having receiving tramadol for which her sister states this makes her more loopy.  Suspect likely acutely worsened with suspected UTI. -Neurochecks -Discontinue tramadol  Dementia with behavioral disturbances and hallucinations anxiety: At baseline patient is only alert and oriented to self and sometimes will  hallucinate. -Orders placed for safety sitter -Continue home regimen of Lexapro, Depakote, and Seroquel  Acute kidney injury superimposed on chronic kidney disease stage IV: Patient presents with creatinine elevated up to 2.74 with BUN 41.  Baseline creatinine previously had been around 2.25 in May.  Suspect patient is possibly little dehydrated. -Check bladder scan every shift x2 and In-N-Out cath if greater than 350 mL of urine present -Gentle normal saline IV fluids 75 mL/h -Recheck kidney function  Right toe fractures: Prior to arrival.  Patient had fallen last week in had suffered right toe fractures.  The toes have been previously wrapped. -Splint right toes -Voltaren gel as needed for pain  Essential hypertension: Blood pressures were initially elevated up to 175/89.  Home medications include amlodipine 5 mg daily which had been adjusted from 10 mg during previous hospitalization in March. -Continue home medication regimen -Hydralazine IV as needed  Paroxysmal atrial fibrillation: Patient appears to be in sinus rhythm at this time, but had previously had recurrent falls for which she was thought to be a good candidate for anticoagulation.  Dyslipidemia -Continue fenofibrate and Crestor  History of CVA -Continue aspirin and statin  Tobacco abuse: Patient reportedly continues to smoke cigarettes at the nursing facility -Continue nicotine patch  DVT prophylaxis: Heparin Code Status: Full Family Communication: Sister updated at bedside Disposition Plan: Likely discharge back to [facility] once medically stable Consults called: None Admission status: Inpatient, require more than 2 midnight stay for need of IV antibiotic  Norval Morton MD [facility]   If 7PM-7AM, please contact night-coverage   03/17/2021, 10:00 AM

## 2021-03-17 NOTE — ED Notes (Signed)
Patient this evening remains confused, easily agitated and uncooperative.  Family no longer at bedside.

## 2021-03-17 NOTE — ED Notes (Signed)
Remains confused and becomes combative when trying to move or change her.

## 2021-03-17 NOTE — ED Notes (Signed)
ED Provider at bedside. 

## 2021-03-17 NOTE — ED Notes (Signed)
Remains confused, but not as agitated since IV benadryl

## 2021-03-17 NOTE — ED Notes (Signed)
Perr sister at bedside. Pt do not ambulate. Pt gets around in a wheelchair. Put was able to transfer to the wheelchair with this tech assist and took to and from bathroom. Nurse notified.

## 2021-03-17 NOTE — ED Provider Notes (Signed)
[facility] EMERGENCY DEPARTMENT Provider Note   CSN: [id number] Arrival date & time: 03/16/21  1640     History Chief Complaint  Patient presents with   Sandra Sheppard is a 75 y.o. female with a past medical history of A. fib, CKD, hypertension presenting to the ED with a chief complaint of fall.  History is limited as patient has dementia.  Family member at the bedside states that she had an unwitnessed fall with syncope at nursing facility.  She is concerned that she is "talking out of her head" which usually happens when she has a "bad UTI."  Patient denying any pain.  Denies any complaints of vomiting or diarrhea.  HPI     Past Medical History:  Diagnosis Date   ABDOMINAL AORTIC ANEURYSM    Atrial fibrillation (HCC)    CAROTID STENOSIS    CKD (chronic kidney disease), stage V (HCC)    Complication of anesthesia    Confusion    CVD (cardiovascular disease)    Diabetes ([place])    no meds at present time   FIBRILLATION, ATRIAL    HYPERCHOLESTEROLEMIA  IIA    HYPERTENSION, MILD    MURMUR    Osteoporosis    PERIPHERAL VASCULAR DISEASE    PONV (postoperative nausea and vomiting)    STENOSIS, MITRAL AND AORTIC VALVES     Patient Active Problem List   Diagnosis Date Noted   Syncope and collapse 08/17/2020   Protein-calorie malnutrition, severe 08/17/2020   Near syncope 08/14/2020   CKD (chronic kidney disease) stage 4, GFR 15-29 ml/min ([place]) 08/13/2020   Tobacco abuse 09/22/2019   Educated about COVID-19 virus infection 09/22/2019   Asymptomatic bilateral carotid artery stenosis 09/22/2019   Acute renal failure with acute tubular necrosis superimposed on stage 3 chronic kidney disease ([place])    Palliative care encounter    Hypocalcemia    FTT (failure to thrive) in adult    AKI (acute kidney injury) ([place]) 08/08/2018   Hyperkalemia [id number]   Metabolic acidosis, increased anion gap 08/08/2018   Normocytic anemia 08/08/2018   Diabetes  mellitus type II, non insulin dependent ([place]) 08/08/2018   Bilateral carotid artery disease ([place]) 07/12/2018   Dyslipidemia 07/12/2018   Vomiting 07/04/2016   Gastroenteritis 07/04/2016   CKD (chronic kidney disease) stage 3, GFR 30-59 ml/min ([place]) 07/04/2016   Occlusion and stenosis of carotid artery 04/28/2010   ABDOMINAL AORTIC ANEURYSM 04/28/2010   Peripheral vascular disease ([place]) 12/08/2008   STENOSIS, MITRAL AND AORTIC VALVES 10/07/2008   Paroxysmal atrial fibrillation ([place]) 10/07/2008   H/O aortic valve replacement 08/14/2008   HYPERCHOLESTEROLEMIA  IIA 06/23/2008   HYPERTENSION, MILD 06/23/2008   MURMUR 06/23/2008    Past Surgical History:  Procedure Laterality Date   AORTIC VALVE REPLACEMENT     08/14/2008.  Dr. Roxy Manns   MYRINGOTOMY WITH TUBE PLACEMENT Bilateral 03/01/2019   Procedure: MYRINGOTOMY WITH  T TUBE PLACEMENT;  Surgeon: Leta Baptist, MD;  Location: [place];  Service: ENT;  Laterality: Bilateral;   TOTAL ABDOMINAL HYSTERECTOMY       OB History   No obstetric history on file.     Family History  Problem Relation Age of Onset   Colon polyps Other    Heart disease Mother    Heart disease Father    Heart disease Brother     Social History   Tobacco Use   Smoking status: Former    Packs/day: 0.25  Types: Cigarettes   Smokeless tobacco: Never  Substance Use Topics   Alcohol use: No   Drug use: Never    Home Medications Prior to Admission medications   Medication Sig Start Date End Date Taking? Authorizing Provider  acetaminophen (TYLENOL) 325 MG tablet Take 650 mg by mouth every 4 (four) hours as needed for mild pain. Do not exceed 3,000 mg in 24 hrs   Yes [provider]  amLODipine (NORVASC) 5 MG tablet Take 1 tablet (5 mg total) by mouth daily. Patient taking differently: Take 5 mg by mouth daily. 0900 09/01/20 03/17/21 Yes Shah, Pratik D, DO  aspirin 81 MG tablet Take 81 mg by mouth daily. 0900   Yes [provider]   Cholecalciferol (VITAMIN D-3) 1000 UNITS CAPS Take 2,000 Units by mouth daily. 0900   Yes [provider]  Choline Fenofibrate 45 MG capsule Take 45 mg by mouth daily. 0900   Yes [provider]  divalproex (DEPAKOTE) 125 MG DR tablet Take 125 mg by mouth 2 (two) times daily. 0900, 1700   Yes [provider]  docusate sodium (COLACE) 100 MG capsule Take 1 capsule (100 mg total) by mouth 2 (two) times daily. Patient taking differently: Take 100 mg by mouth 2 (two) times daily. 0900, 1800 08/31/18  Yes Hongalgi, Lenis Dickinson, MD  escitalopram (LEXAPRO) 5 MG tablet Take 5 mg by mouth daily. 0900 08/07/20  Yes [provider]  loratadine (CLARITIN) 10 MG tablet Take 10 mg by mouth daily as needed for allergies.   Yes [provider]  meclizine (ANTIVERT) 12.5 MG tablet Take 12.5 mg by mouth 3 (three) times daily as needed for dizziness.   Yes [provider]  Melatonin 10 MG TABS Take 10 mg by mouth at bedtime. 2100   Yes [provider]  Multiple Vitamin (MULTIVITAMIN) capsule Take 1 capsule by mouth daily. 0900   Yes [provider]  Multiple Vitamins-Minerals (PRESERVISION AREDS 2+MULTI VIT) CAPS Take 1 capsule by mouth daily at 6 (six) AM. 0900   Yes [provider]  OVER THE COUNTER MEDICATION Take 237 mLs by mouth 3 (three) times daily between meals. House Supplement for nutritional support. 0900, 1400, 2100.   Yes [provider]  QUEtiapine (SEROQUEL) 25 MG tablet Take 1 tablet (25 mg total) by mouth at bedtime. Patient taking differently: Take 25 mg by mouth at bedtime. 2100 09/01/20  Yes Shah, Pratik D, DO  rosuvastatin (CRESTOR) 5 MG tablet Take 5 mg by mouth daily. 0900   Yes [provider]  senna-docusate (SENOKOT-S) 8.6-50 MG tablet Take 1 tablet by mouth at bedtime. Patient taking differently: Take 1 tablet by mouth at bedtime. 2100 09/01/20  Yes Shah, Pratik D, DO  traMADol (ULTRAM) 50 MG tablet  Take 50 mg by mouth every 8 (eight) hours as needed for moderate pain.   Yes [provider]  vitamin B-12 1000 MCG tablet Take 1 tablet (1,000 mcg total) by mouth daily. Patient taking differently: Take 1,000 mcg by mouth daily. 0900 08/31/18  Yes Hongalgi, Lenis Dickinson, MD  feeding supplement (ENSURE ENLIVE / ENSURE PLUS) LIQD Take 237 mLs by mouth 3 (three) times daily between meals. Patient not taking: No sig reported 09/01/20   Manuella Ghazi, Pratik D, DO  nicotine (NICODERM CQ - DOSED IN MG/24 HOURS) 21 mg/24hr patch Place 1 patch (21 mg total) onto the skin daily. Patient not taking: No sig reported 09/01/20   Heath Lark D, DO  zoledronic  acid (RECLAST) 5 MG/100ML SOLN injection Inject 5 mg into the vein See admin instructions. Once a year    [provider]    Allergies    Ativan [lorazepam], Codeine, Sulfonamide derivatives, and Penicillins  Review of Systems   Review of Systems  Unable to perform ROS: Dementia   Physical Exam Updated Vital Signs BP (!) 154/71   Pulse 76   Temp 98 F (36.7 C) (Oral)   Resp 19   Ht '5\' 5"'$  (1.651 m)   Wt 52.2 kg   SpO2 99%   BMI 19.14 kg/m   Physical Exam Vitals and nursing note reviewed.  Constitutional:      General: She is not in acute distress.    Appearance: She is well-developed.  HENT:     Head: Normocephalic and atraumatic.     Nose: Nose normal.  Eyes:     General: No scleral icterus.       Right eye: No discharge.        Left eye: No discharge.     Conjunctiva/sclera: Conjunctivae normal.  Cardiovascular:     Rate and Rhythm: Normal rate and regular rhythm.     Heart sounds: Normal heart sounds. No murmur heard.   No friction rub. No gallop.  Pulmonary:     Effort: Pulmonary effort is normal. No respiratory distress.     Breath sounds: Normal breath sounds.  Abdominal:     General: Bowel sounds are normal. There is no distension.     Palpations: Abdomen is soft.     Tenderness: There is no abdominal tenderness.  There is no guarding.  Musculoskeletal:        General: Normal range of motion.     Cervical back: Normal range of motion and neck supple.     Comments: Tenderness to palpation of bilateral hips without any deformities.  Skin:    General: Skin is warm and dry.     Findings: No rash.  Neurological:     Mental Status: She is alert and oriented to person, place, and time.     Cranial Nerves: No cranial nerve deficit.     Sensory: No sensory deficit.     Motor: No weakness or abnormal muscle tone.     Coordination: Coordination normal.     Comments: Alert, able to follow commands.  No facial asymmetry.  Strength 5/5 in bilateral upper and lower extremities.    ED Results / Procedures / Treatments   Labs (all labs ordered are listed, but only abnormal results are displayed) Labs Reviewed  URINALYSIS, ROUTINE W REFLEX MICROSCOPIC - Abnormal; Notable for the following components:      Result Value   Color, Urine AMBER (*)    APPearance CLOUDY (*)    Ketones, ur 5 (*)    Protein, ur 30 (*)    Leukocytes,Ua LARGE (*)    WBC, UA >50 (*)    Bacteria, UA MANY (*)    All other components within normal limits  COMPREHENSIVE METABOLIC PANEL - Abnormal; Notable for the following components:   CO2 20 (*)    Glucose, Bld 135 (*)    BUN 41 (*)    Creatinine, Ser 2.74 (*)    GFR, Estimated 17 (*)    All other components within normal limits  CBG MONITORING, ED - Abnormal; Notable for the following components:   Glucose-Capillary 136 (*)    All other components within normal limits  URINE CULTURE  CBC WITH DIFFERENTIAL/PLATELET  TROPONIN I (HIGH SENSITIVITY)  TROPONIN I (HIGH SENSITIVITY)    EKG EKG Interpretation  Date/Time:  Wednesday March 17 2021 07:00:01 EDT Ventricular Rate:  72 PR Interval:  152 QRS Duration: 91 QT Interval:  407 QTC Calculation: 446 R Axis:   82 Text Interpretation: Sinus rhythm Anterior infarct, old Abnormal T, consider ischemia, lateral leads Sinus  arrhythmia Confirmed by Lavenia Atlas [phone]) on 03/17/2021 8:31:46 AM  Radiology CT Head Wo Contrast  Result Date: 03/16/2021 CLINICAL DATA:  Head trauma EXAM: CT HEAD WITHOUT CONTRAST TECHNIQUE: Contiguous axial images were obtained from the base of the skull through the vertex without intravenous contrast. COMPARISON:  CT brain 11/06/2020 FINDINGS: Brain: No acute territorial infarction, hemorrhage or intracranial mass. Mild atrophy. Extensive chronic small-vessel ischemic changes of the white matter. Chronic lacunar infarcts in the right basal ganglia and adjacent white matter. Stable ventricle size. Vascular: No hyperdense vessels. Vertebral and carotid vascular calcification Skull: Normal. Negative for fracture or focal lesion. Sinuses/Orbits: No acute finding. Other: None IMPRESSION: 1. No CT evidence for acute intracranial abnormality. 2. Atrophy and chronic small vessel ischemic changes of the white matter Electronically Signed   By: Donavan Foil M.D.   On: 03/16/2021 18:19   CT Cervical Spine Wo Contrast  Result Date: 03/17/2021 CLINICAL DATA:  75 year old female with history of trauma from a fall. EXAM: CT CERVICAL SPINE WITHOUT CONTRAST TECHNIQUE: Multidetector CT imaging of the cervical spine was performed without intravenous contrast. Multiplanar CT image reconstructions were also generated. COMPARISON:  Cervical spine CT 11/03/2020. FINDINGS: Alignment: Normal. Skull base and vertebrae: No acute fracture. No primary bone lesion or focal pathologic process. Soft tissues and spinal canal: No prevertebral fluid or swelling. No visible canal hematoma. Disc levels: Mild multilevel degenerative disc disease, most apparent at C4-C5. Mild multilevel facet arthropathy. Upper chest: Emphysema. Other: None. IMPRESSION: 1. No evidence of significant acute traumatic injury to the cervical spine. 2. Mild multilevel degenerative disc disease and cervical spondylosis, as above. 3. Emphysema. Electronically  Signed   By: Vinnie Langton M.D.   On: 03/17/2021 08:41   DG Pelvis Portable  Result Date: 03/17/2021 CLINICAL DATA:  75 year old female with history of unwitnessed fall yesterday. EXAM: PORTABLE PELVIS 1-2 VIEWS COMPARISON:  No priors. FINDINGS: There is no evidence of pelvic fracture or diastasis. No pelvic bone lesions are seen. Mild joint space narrowing, subchondral sclerosis and osteophyte formation is noted in the hip joints bilaterally, indicative of osteoarthritis. Atherosclerotic calcifications in the pelvic vasculature. IMPRESSION: 1. No radiographic evidence of significant acute traumatic injury to the bony pelvis. Electronically Signed   By: Vinnie Langton M.D.   On: 03/17/2021 07:40   DG Chest Portable 1 View  Result Date: 03/17/2021 CLINICAL DATA:  75 year old female with history of unwitnessed fall yesterday. EXAM: PORTABLE CHEST 1 VIEW COMPARISON:  Chest x-ray 08/13/2020. FINDINGS: Lung volumes are low. Mild diffuse interstitial prominence throughout the mid to lower lungs bilaterally, similar to the prior examination no consolidative airspace disease. No pleural effusions. No pneumothorax. No pulmonary nodule or mass noted. Pulmonary vasculature and the cardiomediastinal silhouette are within normal limits. Atherosclerotic calcifications in the thoracic aorta. 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 IMPRESSION: 1. Low lung volumes without radiographic evidence of acute cardiopulmonary disease. 2. Aortic atherosclerosis. Electronically Signed   By: Vinnie Langton M.D.   On: 03/17/2021 07:43    Procedures Procedures   Medications Ordered in ED Medications  cefTRIAXone (ROCEPHIN) 1  g in sodium chloride 0.9 % 100 mL IVPB (has no administration in time range)    ED Course  I have reviewed the triage vital signs and the nursing notes.  Pertinent labs & imaging results that were available during my care of the  patient were reviewed by me and considered in my medical decision making (see chart for details).  Clinical Course as of 03/17/21 1008  Wed Mar 17, 2021  0649 Leukocytes,Ua(!): LARGE [HK]  0839 WBC, UA(!): >50 [HK]  0839 Bacteria, UA(!): MANY [HK]  0839 Creatinine(!): 2.74 At baseline. [HK]  [phone] Patient not ambulatory here, family member states that she is wheelchair-bound at baseline. [HK]  [id number] Troponin I (High Sensitivity): 17 [HK]    Clinical Course User Index [HK] Delia Heady, PA-C   MDM Rules/Calculators/A&P                           75 year old female presenting to the ED for fall.  Unwitnessed fall at nursing facility with syncope.  Family members concerned that she is altered compared to her baseline.  They are concerned that she has a UTI.  CT scan shows no acute abnormalities.  Urinalysis with evidence of UTI with large leukocytes, pyuria and many bacteria.  Creatinine is at baseline.  Urine sent for culture.  Will obtain repeat EKG, CT scan of the cervical spine, x-rays of the hip and chest due to pain with palpation of the hips although no deformities.  X-ray of the pelvis and chest without any abnormalities.  CT of the cervical spine is negative for acute abnormality. EKG done last night showed no acute findings.  Repeat EKG done this morning after my evaluation shows some abnormal T waves compared to yesterday.  Troponin is 17 here.  She will need to be admitted  to medicine service for ongoing management of her UTI causing her altered mental status and in the setting of syncope.   Portions of this note were generated with [employer]. Dictation errors may occur despite best attempts at proofreading.  Final Clinical Impression(s) / ED Diagnoses Final diagnoses:  Lower urinary tract infectious disease  Syncope, unspecified syncope type  Fall, initial encounter    Rx / DC Orders ED Discharge Orders     None        Delia Heady, PA-C 03/17/21  1008    Horton, Alvin Critchley, DO 03/18/21 1007

## 2021-03-18 DIAGNOSIS — N3000 Acute cystitis without hematuria: Secondary | ICD-10-CM | POA: Diagnosis not present

## 2021-03-18 LAB — BASIC METABOLIC PANEL
Anion gap: 12 (ref 5–15)
BUN: 26 mg/dL — ABNORMAL HIGH (ref 8–23)
CO2: 20 mmol/L — ABNORMAL LOW (ref 22–32)
Calcium: 9.2 mg/dL (ref 8.9–10.3)
Chloride: 107 mmol/L (ref 98–111)
Creatinine, Ser: 2.07 mg/dL — ABNORMAL HIGH (ref 0.44–1.00)
GFR, Estimated: 24 mL/min — ABNORMAL LOW (ref >60)
Glucose, Bld: 119 mg/dL — ABNORMAL HIGH (ref 70–99)
Potassium: 4 mmol/L (ref 3.5–5.1)
Sodium: 139 mmol/L (ref 135–145)

## 2021-03-18 LAB — URINE CULTURE

## 2021-03-18 MED ORDER — AMLODIPINE BESYLATE 10 MG PO TABS
10.0000 mg | ORAL_TABLET | Freq: Every day | ORAL | Status: DC
  Administered 2021-03-19 – 2021-03-23 (×5): 10 mg via ORAL
  Filled 2021-03-18 (×5): qty 1

## 2021-03-18 NOTE — Progress Notes (Signed)
PROGRESS NOTE    Sandra Sheppard  [id number] DOB: 02-13-1946 DOA: 03/16/2021 PCP: Prince Solian, MD   Brief Narrative: 75 year old with past medical history significant for paroxysmal A. fib, hypertension, CKD stage IV, dementia who presents after having an unwitnessed fall the day prior to admission.  Unclear if patient try to get herself out of the wheelchair or possible pass out.  Patient is not able to give significant history due to her dementia.  Patient has become more disoriented with increased hallucination and repetitive questioning.  Patient has been unable to feed herself lately.  Had a prior admission due to suspected syncopal episode due to orthostatic hypotension.  Evaluation in the ED CT head cervical spine did not show any acute abnormality.  Creatinine 2.4, chest x-ray low lung volume, pelvis x-ray did not show any acute fracture.  UA with large leukocytes greater than 50 white blood cell.  Assessment & Plan:   Principal Problem:   UTI (urinary tract infection) Active Problems:   Paroxysmal atrial fibrillation (HCC)   Dyslipidemia   Acute kidney injury superimposed on CKD ([place])   Tobacco abuse   Fall   Closed fracture of right toe   Dementia with behavioral disturbance   Acute metabolic encephalopathy   1-UTI: UA with more than 50 white blood cell. Continue with IV ceftriaxone Follow urine culture  2-Recurrent fall: Monitor on telemetry.  PT OT  3-Acute Metabolic Encephalopathy: Patient was noted to be more altered with increased hallucination.  CT head no acute abnormality.  Suspect related to UTI Agree with discontinuation of tramadol  4-Dementia with behavioral disturbance and hallucination: Baseline patient is only alert and oriented to self and sometimes hallucinate. Continue with Lexapro Depakote and Seroquel  5-AKI on CKD stage IV: Cr on  admission 2.7 BUN 41.  Baseline 2.2. Continue with IV fluids  6-right toe fractures: Ortho recommend  boot, PT weightbearing as tolerated  hypertension: Continue with amlodipine Paroxysmal A. fib: Noted to be a good candidate for anticoagulation  History of CVA; continue with aspirin and statins   Estimated body mass index is 19.85 kg/m as calculated from the following:   Height as of this encounter: '5\' 5"'$  (1.651 m).   Weight as of this encounter: 54.1 kg.   DVT prophylaxis: Heparin  Code Status: full code Family Communication: Disposition Plan:  Status is: Inpatient  Remains inpatient appropriate because:IV treatments appropriate due to intensity of illness or inability to take PO  Dispo: The patient is from: SNF              Anticipated d/c is to: SNF              Patient currently is not medically stable to d/c.   Difficult to place patient No        Consultants:  none Antimicrobials:  Ceftriaxone  Subjective: She is alert, confuse.    Objective: Vitals:   03/18/21 0454 03/18/21 0500 03/18/21 0733 03/18/21 1137  BP: (!) 170/77 (!) 161/79 (!) 181/83 (!) 169/77  Pulse: 82  77 80  Resp: '18  16 20  '$ Temp: (!) 97.4 F (36.3 C)  (!) 97.4 F (36.3 C) 98.3 F (36.8 C)  TempSrc: Oral  Oral Oral  SpO2: 97%  100% 98%  Weight:      Height:        Intake/Output Summary (Last 24 hours) at 03/18/2021 1605 Last data filed at 03/18/2021 1430 Gross per 24 hour  Intake 2067.23 ml  Output 1200  ml  Net 867.23 ml   Filed Weights   03/16/21 1659 03/18/21 0043  Weight: 52.2 kg 54.1 kg    Examination:  General exam: Appears calm and comfortable  Respiratory system: Clear to auscultation. Respiratory effort normal. Cardiovascular system: S1 & S2 heard, RRR. No JVD, murmurs, rubs, gallops or clicks. No pedal edema. Gastrointestinal system: Abdomen is nondistended, soft and nontender. No organomegaly or masses felt. Normal bowel sounds heard. Central nervous system: Alert and confuse Extremities: no edema  Data Reviewed: I have personally reviewed following labs and  imaging studies  CBC: Recent Labs  Lab 03/16/21 1708  WBC 8.7  NEUTROABS 6.3  HGB 12.1  HCT 37.6  MCV 93.3  PLT 0000000   Basic Metabolic Panel: Recent Labs  Lab 03/16/21 1708  NA 137  K 4.5  CL 106  CO2 20*  GLUCOSE 135*  BUN 41*  CREATININE 2.74*  CALCIUM 9.3   GFR: Estimated Creatinine Clearance: 14.9 mL/min (A) (by C-G formula based on SCr of 2.74 mg/dL (H)). Liver Function Tests: Recent Labs  Lab 03/16/21 1708  AST 17  ALT 11  ALKPHOS 63  BILITOT 0.9  PROT 7.3  ALBUMIN 3.8   No results for input(s): LIPASE, AMYLASE in the last 168 hours. No results for input(s): AMMONIA in the last 168 hours. Coagulation Profile: No results for input(s): INR, PROTIME in the last 168 hours. Cardiac Enzymes: No results for input(s): CKTOTAL, CKMB, CKMBINDEX, TROPONINI in the last 168 hours. BNP (last 3 results) No results for input(s): PROBNP in the last 8760 hours. HbA1C: No results for input(s): HGBA1C in the last 72 hours. CBG: Recent Labs  Lab 03/17/21 0731  GLUCAP 136*   Lipid Profile: No results for input(s): CHOL, HDL, LDLCALC, TRIG, CHOLHDL, LDLDIRECT in the last 72 hours. Thyroid Function Tests: No results for input(s): TSH, T4TOTAL, FREET4, T3FREE, THYROIDAB in the last 72 hours. Anemia Panel: No results for input(s): VITAMINB12, FOLATE, FERRITIN, TIBC, IRON, RETICCTPCT in the last 72 hours. Sepsis Labs: No results for input(s): PROCALCITON, LATICACIDVEN in the last 168 hours.  Recent Results (from the past 240 hour(s))  Urine Culture     Status: Abnormal   Collection Time: 03/17/21  1:35 AM   Specimen: Urine, Clean Catch  Result Value Ref Range Status   Specimen Description URINE, CLEAN CATCH  Final   Special Requests   Final    NONE Performed at [facility], [address], [place], [place] [address]    Culture MULTIPLE SPECIES PRESENT, SUGGEST RECOLLECTION (A)  Final   Report Status 03/18/2021 FINAL  Final  Resp Panel by RT-PCR (Flu A&B,  Covid) Nasopharyngeal Swab     Status: None   Collection Time: 03/17/21 11:00 AM   Specimen: Nasopharyngeal Swab; Nasopharyngeal(NP) swabs in vial transport medium  Result Value Ref Range Status   SARS Coronavirus 2 by RT PCR NEGATIVE NEGATIVE Final    Comment: (NOTE) SARS-CoV-2 target nucleic acids are NOT DETECTED.  The SARS-CoV-2 RNA is generally detectable in upper respiratory specimens during the acute phase of infection. The lowest concentration of SARS-CoV-2 viral copies this assay can detect is 138 copies/mL. A negative result does not preclude SARS-Cov-2 infection and should not be used as the sole basis for treatment or other patient management decisions. A negative result may occur with  improper specimen collection/handling, submission of specimen other than nasopharyngeal swab, presence of viral mutation(s) within the areas targeted by this assay, and inadequate number of viral copies(<138 copies/mL).  A negative result must be combined with clinical observations, patient history, and epidemiological information. The expected result is Negative.  Fact Sheet for Patients:  EntrepreneurPulse.com.au  Fact Sheet for Healthcare Providers:  IncredibleEmployment.be  This test is no t yet approved or cleared by the [place] FDA and  has been authorized for detection and/or diagnosis of SARS-CoV-2 by FDA under an Emergency Use Authorization (EUA). This EUA will remain  in effect (meaning this test can be used) for the duration of the COVID-19 declaration under Section 564(b)(1) of the Act, 21 U.S.C.section 360bbb-3(b)(1), unless the authorization is terminated  or revoked sooner.       Influenza A by PCR NEGATIVE NEGATIVE Final   Influenza B by PCR NEGATIVE NEGATIVE Final    Comment: (NOTE) The Xpert Xpress SARS-CoV-2/FLU/RSV plus assay is intended as an aid in the diagnosis of influenza from Nasopharyngeal swab specimens and should  not be used as a sole basis for treatment. Nasal washings and aspirates are unacceptable for Xpert Xpress SARS-CoV-2/FLU/RSV testing.  Fact Sheet for Patients: EntrepreneurPulse.com.au  Fact Sheet for Healthcare Providers: IncredibleEmployment.be  This test is not yet approved or cleared by the [place] FDA and has been authorized for detection and/or diagnosis of SARS-CoV-2 by FDA under an Emergency Use Authorization (EUA). This EUA will remain in effect (meaning this test can be used) for the duration of the COVID-19 declaration under Section 564(b)(1) of the Act, 21 U.S.C. section 360bbb-3(b)(1), unless the authorization is terminated or revoked.  Performed at [facility], [place] [address] [place], [place] [address]          Radiology Studies: CT Head Wo Contrast  Result Date: 03/16/2021 CLINICAL DATA:  Head trauma EXAM: CT HEAD WITHOUT CONTRAST TECHNIQUE: Contiguous axial images were obtained from the base of the skull through the vertex without intravenous contrast. COMPARISON:  CT brain 11/06/2020 FINDINGS: Brain: No acute territorial infarction, hemorrhage or intracranial mass. Mild atrophy. Extensive chronic small-vessel ischemic changes of the white matter. Chronic lacunar infarcts in the right basal ganglia and adjacent white matter. Stable ventricle size. Vascular: No hyperdense vessels. Vertebral and carotid vascular calcification Skull: Normal. Negative for fracture or focal lesion. Sinuses/Orbits: No acute finding. Other: None IMPRESSION: 1. No CT evidence for acute intracranial abnormality. 2. Atrophy and chronic small vessel ischemic changes of the white matter Electronically Signed   By: Donavan Foil M.D.   On: 03/16/2021 18:19   CT Cervical Spine Wo Contrast  Result Date: 03/17/2021 CLINICAL DATA:  74 year old female with history of trauma from a fall. EXAM: CT CERVICAL SPINE WITHOUT CONTRAST TECHNIQUE: Multidetector  CT imaging of the cervical spine was performed without intravenous contrast. Multiplanar CT image reconstructions were also generated. COMPARISON:  Cervical spine CT 11/03/2020. FINDINGS: Alignment: Normal. Skull base and vertebrae: No acute fracture. No primary bone lesion or focal pathologic process. Soft tissues and spinal canal: No prevertebral fluid or swelling. No visible canal hematoma. Disc levels: Mild multilevel degenerative disc disease, most apparent at C4-C5. Mild multilevel facet arthropathy. Upper chest: Emphysema. Other: None. IMPRESSION: 1. No evidence of significant acute traumatic injury to the cervical spine. 2. Mild multilevel degenerative disc disease and cervical spondylosis, as above. 3. Emphysema. Electronically Signed   By: Vinnie Langton M.D.   On: 03/17/2021 08:41   DG Pelvis Portable  Result Date: 03/17/2021 CLINICAL DATA:  75 year old female with history of unwitnessed fall yesterday. EXAM: PORTABLE PELVIS 1-2 VIEWS COMPARISON:  No priors. FINDINGS: There is no evidence of  pelvic fracture or diastasis. No pelvic bone lesions are seen. Mild joint space narrowing, subchondral sclerosis and osteophyte formation is noted in the hip joints bilaterally, indicative of osteoarthritis. Atherosclerotic calcifications in the pelvic vasculature. IMPRESSION: 1. No radiographic evidence of significant acute traumatic injury to the bony pelvis. Electronically Signed   By: Vinnie Langton M.D.   On: 03/17/2021 07:40   DG Chest Portable 1 View  Result Date: 03/17/2021 CLINICAL DATA:  75 year old female with history of unwitnessed fall yesterday. EXAM: PORTABLE CHEST 1 VIEW COMPARISON:  Chest x-ray 08/13/2020. FINDINGS: Lung volumes are low. Mild diffuse interstitial prominence throughout the mid to lower lungs bilaterally, similar to the prior examination no consolidative airspace disease. No pleural effusions. No pneumothorax. No pulmonary nodule or mass noted. Pulmonary vasculature and the  cardiomediastinal silhouette are within normal limits. Atherosclerotic calcifications in the thoracic aorta. 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 IMPRESSION: 1. Low lung volumes without radiographic evidence of acute cardiopulmonary disease. 2. Aortic atherosclerosis. Electronically Signed   By: Vinnie Langton M.D.   On: 03/17/2021 07:43   DG Foot 2 Views Right  Result Date: 03/17/2021 CLINICAL DATA:  Unwitnessed fall. EXAM: RIGHT FOOT - 2 VIEW COMPARISON:  None. FINDINGS: An acute fracture is seen involving the mid portion of the fifth right metatarsal. There is no evidence of dislocation. There is no evidence of arthropathy or other focal bone abnormality. Soft tissue swelling is seen adjacent to the previously noted fracture site. IMPRESSION: Acute fracture of the fifth right metatarsal. Electronically Signed   By: Virgina Norfolk M.D.   On: 03/17/2021 19:24        Scheduled Meds:  amLODipine  5 mg Oral Daily   aspirin EC  81 mg Oral Daily   diclofenac Sodium  2 g Topical BID   divalproex  125 mg Oral BID   escitalopram  5 mg Oral Daily   feeding supplement  237 mL Oral TID BM   fenofibrate  54 mg Oral Daily   heparin  5,000 Units Subcutaneous Q8H   melatonin  10 mg Oral QHS   QUEtiapine  25 mg Oral QHS   rosuvastatin  5 mg Oral Daily   senna-docusate  1 tablet Oral QHS   sodium chloride flush  3 mL Intravenous Q12H   Continuous Infusions:  sodium chloride 75 mL/hr at 03/18/21 1341   cefTRIAXone (ROCEPHIN)  IV 1 g (03/18/21 0955)     LOS: 1 day    Time spent: 35 minutes.     Elmarie Shiley, MD [facility]   If 7PM-7AM, please contact night-coverage [email or web address]  03/18/2021, 4:05 PM

## 2021-03-18 NOTE — Evaluation (Signed)
Physical Therapy Evaluation Only Patient Details Name: Sandra Sheppard MRN: [id number] DOB: 10-16-1945 Today's Date: 03/18/2021  History of Present Illness  Pt is a 75 y.o. female who presented 03/16/21 s/p unwitnessed fall in which pt sustained a right 5th metatarsal fx. Pt with UTI. PMH: paroxysmal atrial fibrillation, hypertension, CKD stage IV, carotid stenosis, abdominal aortic aneurysm, osteoporosis, murmur, PVD, and dementia   Clinical Impression  Pt presents with condition above and deficits mentioned below, see PT Problem List. No family present to provide info on PLOF as pt is confused, appears to be hallucinating, and is a poor historian at this time. However, per chart, pt has recently been living at a nursing facility and not ambulating, but rather mobilizing in a wheelchair. Per PT note in March of 2022, she was ambulating with a RW with some assistance at that time. Therefore, she appears to have declined significantly functionally recently. Currently, she has a strong posterior lean in all positions and is at high risk for falls. She displays deficits in balance, strength, coordination, cognition, and activity tolerance. Currently, pt is requiring maxA-TA x2 for all bed mobility and transfers, but this may be her recent baseline. All further PT needs can be addressed when she returns to her SNF to address her deficits and maximize her functional mobility independence and safety. No further acute PT needs at this time, all education completed, will sign off.     Recommendations for follow up therapy are one component of a multi-disciplinary discharge planning process, led by the attending physician.  Recommendations may be updated based on patient status, additional functional criteria and insurance authorization.  Follow Up Recommendations SNF;Supervision/Assistance - 24 hour    Equipment Recommendations  None recommended by PT (defer to next venue of care)    Recommendations for  Other Services       Precautions / Restrictions Precautions Precautions: Fall Required Braces or Orthoses: Other Brace Other Brace: CAM boot R leg Restrictions Weight Bearing Restrictions: Yes RLE Weight Bearing: Weight bearing as tolerated      Mobility  Bed Mobility Overal bed mobility: Needs Assistance Bed Mobility: Supine to Sit;Sit to Supine     Supine to sit: Total assist;+2 for physical assistance;+2 for safety/equipment Sit to supine: Total assist;+2 for physical assistance;+2 for safety/equipment   General bed mobility comments: Cues provided to assist legs off EOB but pt having difficulty following commands, TAx2 for all bed mobility aspects.    Transfers Overall transfer level: Needs assistance Equipment used: 2 person hand held assist Transfers: Sit to/from Stand Sit to Stand: Max assist;+2 physical assistance;+2 safety/equipment         General transfer comment: Bil HHA and maxAx2 to facilitate anterior transition of trunk, resist pt's posterior bias, and extend her hips to stand.  Ambulation/Gait Ambulation/Gait assistance: Total assist;+2 physical assistance;+2 safety/equipment Gait Distance (Feet): 1 Feet Assistive device: 2 person hand held assist Gait Pattern/deviations: Leaning posteriorly;Narrow base of support;Decreased weight shift to right;Decreased weight shift to left Gait velocity: reduced Gait velocity interpretation: <1.31 ft/sec, indicative of household ambulator General Gait Details: Pt with strong posterior lean and not following max multi-modal cues to shift weight to step laterally at EOB, TAx2 to shift weight and advance each leg 1x.  Stairs            Wheelchair Mobility    Modified Rankin (Stroke Patients Only) Modified Rankin (Stroke Patients Only) Pre-Morbid Rankin Score: Severe disability Modified Rankin: Severe disability     Balance Overall balance  assessment: Needs assistance Sitting-balance support: Bilateral  upper extremity supported;No upper extremity supported;Feet supported Sitting balance-Leahy Scale: Poor Sitting balance - Comments: Brief moments of min guard assist when pt would activate her core muscles to flex her trunk anteriorly, but otherwise pt with strong posterior bias needing mod-maxA to sit statically EOB, even when cued to reach forward to adjust sock, run hands down lap, reach therapist etc Postural control: Posterior lean Standing balance support: Bilateral upper extremity supported Standing balance-Leahy Scale: Zero Standing balance comment: MaxAx2 and bil UE support to stand.                             Pertinent Vitals/Pain Pain Assessment: Faces Faces Pain Scale: Hurts a little bit Pain Location: R ankle with passive dorsiflexion Pain Descriptors / Indicators: Discomfort;Grimacing;Guarding Pain Intervention(s): Limited activity within patient's tolerance;Monitored during session;Repositioned    Home Living Family/patient expects to be discharged to:: Skilled nursing facility                 Additional Comments: Per chart, from [facility]    Prior Function Level of Independence: Needs assistance         Comments: Per chart, pt has been living at [facility] recently and having increased falls. Also, NT this admission reporting pt's daughter reported pt no longer walks but rather gets around in a wheelchair. Per chart review, pt was ambulating short distances with min guard-minA and a RW during admission in March 2022.     Hand Dominance        Extremity/Trunk Assessment   Upper Extremity Assessment Upper Extremity Assessment: Defer to OT evaluation    Lower Extremity Assessment Lower Extremity Assessment: Generalized weakness;Difficult to assess due to impaired cognition (noted functionally, pt resists R ankle dorsiflexion PROM but able to obtain neutral eventually)    Cervical / Trunk Assessment Cervical / Trunk Assessment: Kyphotic   Communication   Communication: No difficulties  Cognition Arousal/Alertness: Awake/alert Behavior During Therapy: WFL for tasks assessed/performed;Agitated (couple moments where pt began to get agitated but was quickly soothed to be calm again) Overall Cognitive Status: History of cognitive impairments - at baseline                                 General Comments: Pt with diagnosis of dementia, but appears to be worse compared to baseline per chart review. Pt appears to be hallucinating, calling therapists names of likely family members and referring to people in room that were not there. Pt with poor awareness into her deficits and safety, resisting leaning anteriorly to maintain her balance often.      General Comments      Exercises     Assessment/Plan    PT Assessment All further PT needs can be met in the next venue of care  PT Problem List Decreased strength;Decreased range of motion;Decreased activity tolerance;Decreased balance;Decreased mobility;Decreased coordination;Decreased cognition;Decreased knowledge of use of DME;Decreased safety awareness       PT Treatment Interventions      PT Goals (Current goals can be found in the Care Plan section)  Acute Rehab PT Goals Patient Stated Goal: agreeable to session PT Goal Formulation: Patient unable to participate in goal setting Time For Goal Achievement: 03/19/21 Potential to Achieve Goals: Fair    Frequency     Barriers to discharge        Co-evaluation PT/OT/SLP  Co-Evaluation/Treatment: Yes Reason for Co-Treatment: Necessary to address cognition/behavior during functional activity;For patient/therapist safety;To address functional/ADL transfers PT goals addressed during session: Mobility/safety with mobility;Balance         AM-PAC PT "6 Clicks" Mobility  Outcome Measure Help needed turning from your back to your side while in a flat bed without using bedrails?: Total Help needed moving from  lying on your back to sitting on the side of a flat bed without using bedrails?: Total Help needed moving to and from a bed to a chair (including a wheelchair)?: Total Help needed standing up from a chair using your arms (e.g., wheelchair or bedside chair)?: Total Help needed to walk in hospital room?: Total Help needed climbing 3-5 steps with a railing? : Total 6 Click Score: 6    End of Session   Activity Tolerance: Other (comment) (limited by impaired cognition) Patient left: in bed;with call bell/phone within reach;with bed alarm set   PT Visit Diagnosis: Unsteadiness on feet (R26.81);Other abnormalities of gait and mobility (R26.89);Muscle weakness (generalized) (M62.81);History of falling (Z91.81);Repeated falls (R29.6);Difficulty in walking, not elsewhere classified (R26.2)    Time: 4210-3128 PT Time Calculation (min) (ACUTE ONLY): 22 min   Charges:   PT Evaluation $PT Eval Moderate Complexity: 1 Mod          Moishe Spice, PT, DPT Acute Rehabilitation Services  Pager: [phone] Office: [phone]   Orvan Falconer 03/18/2021, 2:28 PM

## 2021-03-18 NOTE — Evaluation (Signed)
Occupational Therapy Evaluation Patient Details Name: Sandra Sheppard MRN: [id number] DOB: September 25, 1945 Today's Date: 03/18/2021   History of Present Illness Pt is a 75 y.o. female who presented 03/16/21 s/p unwitnessed fall in which pt sustained a right 5th metatarsal fx. Pt with UTI. PMH: paroxysmal atrial fibrillation, hypertension, CKD stage IV, carotid stenosis, abdominal aortic aneurysm, osteoporosis, murmur, PVD, and dementia   Clinical Impression   Pt was w/c dependent with assist for transfers and assisted for ADL at her SNF prior to admission. Cooperative for sitting EOB and standing, but with significant posterior lean requiring +2 assist. Recommend return to SNF with therapy at [facility] discretion. No acute OT needs.      Recommendations for follow up therapy are one component of a multi-disciplinary discharge planning process, led by the attending physician.  Recommendations may be updated based on patient status, additional functional criteria and insurance authorization.   Follow Up Recommendations  SNF;Supervision/Assistance - 24 hour    Equipment Recommendations  None recommended by OT    Recommendations for Other Services       Precautions / Restrictions Precautions Precautions: Fall Required Braces or Orthoses: Other Brace Other Brace: CAM boot R leg Restrictions Weight Bearing Restrictions: Yes RLE Weight Bearing: Weight bearing as tolerated      Mobility Bed Mobility Overal bed mobility: Needs Assistance Bed Mobility: Supine to Sit;Sit to Supine     Supine to sit: Total assist;+2 for physical assistance;+2 for safety/equipment Sit to supine: Total assist;+2 for physical assistance;+2 for safety/equipment   General bed mobility comments: Cues provided to assist legs off EOB but pt having difficulty following commands, TAx2 for all bed mobility aspects.    Transfers Overall transfer level: Needs assistance Equipment used: 2 person hand held assist Transfers:  Sit to/from Stand Sit to Stand: Max assist;+2 physical assistance;+2 safety/equipment         General transfer comment: Bil HHA and maxAx2 to facilitate anterior transition of trunk, resist pt's posterior bias, and extend her hips to stand.    Balance Overall balance assessment: Needs assistance Sitting-balance support: Bilateral upper extremity supported;No upper extremity supported;Feet supported Sitting balance-Leahy Scale: Poor Sitting balance - Comments: Brief moments of min guard assist when pt would activate her core muscles to flex her trunk anteriorly, but otherwise pt with strong posterior bias needing mod-maxA to sit statically EOB, even when cued to reach forward to adjust sock, run hands down lap, reach therapist etc Postural control: Posterior lean Standing balance support: Bilateral upper extremity supported Standing balance-Leahy Scale: Zero Standing balance comment: MaxAx2 and bil UE support to stand.                           ADL either performed or assessed with clinical judgement   ADL Overall ADL's : At baseline                                             Vision Patient Visual Report: No change from baseline Additional Comments: central vision, hallucinations     Perception     Praxis      Pertinent Vitals/Pain Pain Assessment: Faces Faces Pain Scale: Hurts a little bit Pain Location: R ankle with passive dorsiflexion Pain Descriptors / Indicators: Discomfort;Grimacing;Guarding Pain Intervention(s): Monitored during session;Repositioned     Hand Dominance     Extremity/Trunk Assessment  Upper Extremity Assessment Upper Extremity Assessment: Overall WFL for tasks assessed (tremulous)   Lower Extremity Assessment Lower Extremity Assessment: Defer to PT evaluation   Cervical / Trunk Assessment Cervical / Trunk Assessment: Kyphotic   Communication Communication Communication: Expressive difficulties (tangential)    Cognition Arousal/Alertness: Awake/alert Behavior During Therapy: Agitated (can become agitated, but easily redirected) Overall Cognitive Status: History of cognitive impairments - at baseline                                 General Comments: Pt with diagnosis of dementia, but appears to be worse compared to baseline per chart review. Pt appears to be hallucinating, calling therapists names of likely family members and referring to people in room that were not there. Pt with poor awareness into her deficits and safety, resisting leaning anteriorly to maintain her balance often.   General Comments       Exercises     Shoulder Instructions      Home Living Family/patient expects to be discharged to:: Skilled nursing facility                                 Additional Comments: Per chart, from [facility]      Prior Functioning/Environment Level of Independence: Needs assistance        Comments: Per chart, pt has been living at [facility] recently and having increased falls. Also, NT this admission reporting pt's daughter reported pt no longer walks but rather gets around in a wheelchair. Per chart review, pt was ambulating short distances with min guard-minA and a RW during admission in March 2022.        OT Problem List: Impaired balance (sitting and/or standing);Decreased cognition      OT Treatment/Interventions:      OT Goals(Current goals can be found in the care plan section) Acute Rehab OT Goals Patient Stated Goal: agreeable to session  OT Frequency:     Barriers to D/C:            Co-evaluation   Reason for Co-Treatment: Necessary to address cognition/behavior during functional activity;For patient/therapist safety PT goals addressed during session: Mobility/safety with mobility;Balance        AM-PAC OT "6 Clicks" Daily Activity     Outcome Measure Help from another person eating meals?: Total Help from another person taking  care of personal grooming?: Total Help from another person toileting, which includes using toliet, bedpan, or urinal?: Total Help from another person bathing (including washing, rinsing, drying)?: Total Help from another person to put on and taking off regular upper body clothing?: Total Help from another person to put on and taking off regular lower body clothing?: Total 6 Click Score: 6   End of Session    Activity Tolerance: Patient tolerated treatment well Patient left: in bed;with call bell/phone within reach;with bed alarm set  OT Visit Diagnosis: Other symptoms and signs involving cognitive function;Unsteadiness on feet (R26.81)                Time: [id number] OT Time Calculation (min): 22 min Charges:  OT General Charges $OT Visit: 1 Visit OT Evaluation $OT Eval Moderate Complexity: 1 Mod  Nestor Lewandowsky, OTR/L Acute Rehabilitation Services Pager: [phone] Office: [phone]   Malka So 03/18/2021, 3:08 PM

## 2021-03-18 NOTE — Progress Notes (Signed)
Orthopedic Tech Progress Note Patient Details:  Sandra Sheppard 11/08/1945 [id number] Left CAM walker in room Ortho Devices Type of Ortho Device: CAM walker Ortho Device/Splint Interventions: Ordered      Danton Sewer A Jonael Paradiso 03/18/2021, 3:03 PM

## 2021-03-19 DIAGNOSIS — N3000 Acute cystitis without hematuria: Secondary | ICD-10-CM | POA: Diagnosis not present

## 2021-03-19 LAB — BASIC METABOLIC PANEL
Anion gap: 11 (ref 5–15)
BUN: 28 mg/dL — ABNORMAL HIGH (ref 8–23)
CO2: 17 mmol/L — ABNORMAL LOW (ref 22–32)
Calcium: 8.7 mg/dL — ABNORMAL LOW (ref 8.9–10.3)
Chloride: 109 mmol/L (ref 98–111)
Creatinine, Ser: 1.93 mg/dL — ABNORMAL HIGH (ref 0.44–1.00)
GFR, Estimated: 27 mL/min — ABNORMAL LOW (ref >60)
Glucose, Bld: 118 mg/dL — ABNORMAL HIGH (ref 70–99)
Potassium: 3.9 mmol/L (ref 3.5–5.1)
Sodium: 137 mmol/L (ref 135–145)

## 2021-03-19 LAB — CBC
HCT: 34.8 % — ABNORMAL LOW (ref 36.0–46.0)
Hemoglobin: 11.7 g/dL — ABNORMAL LOW (ref 12.0–15.0)
MCH: 30.4 pg (ref 26.0–34.0)
MCHC: 33.6 g/dL (ref 30.0–36.0)
MCV: 90.4 fL (ref 80.0–100.0)
Platelets: 234 10*3/uL (ref 150–400)
RBC: 3.85 MIL/uL — ABNORMAL LOW (ref 3.87–5.11)
RDW: 13.2 % (ref 11.5–15.5)
WBC: 7.4 10*3/uL (ref 4.0–10.5)
nRBC: 0 % (ref 0.0–0.2)

## 2021-03-19 MED ORDER — SODIUM BICARBONATE 650 MG PO TABS
650.0000 mg | ORAL_TABLET | Freq: Three times a day (TID) | ORAL | Status: DC
  Administered 2021-03-19 – 2021-03-23 (×12): 650 mg via ORAL
  Filled 2021-03-19 (×12): qty 1

## 2021-03-19 NOTE — Progress Notes (Signed)
PROGRESS NOTE    Sandra Sheppard  [id number] DOB: 23-Feb-1946 DOA: 03/16/2021 PCP: Prince Solian, MD   Brief Narrative: 75 year old with past medical history significant for paroxysmal A. fib, hypertension, CKD stage IV, dementia who presents after having an unwitnessed fall the day prior to admission.  Unclear if patient try to get herself out of the wheelchair or possible pass out.  Patient is not able to give significant history due to her dementia.  Patient has become more disoriented with increased hallucination and repetitive questioning.  Patient has been unable to feed herself lately.  Had a prior admission due to suspected syncopal episode due to orthostatic hypotension.  Evaluation in the ED CT head cervical spine did not show any acute abnormality.  Creatinine 2.4, chest x-ray low lung volume, pelvis x-ray did not show any acute fracture.  UA with large leukocytes greater than 50 white blood cell.  Assessment & Plan:   Principal Problem:   UTI (urinary tract infection) Active Problems:   Paroxysmal atrial fibrillation (HCC)   Dyslipidemia   Acute kidney injury superimposed on CKD ([place])   Tobacco abuse   Fall   Closed fracture of right toe   Dementia with behavioral disturbance   Acute metabolic encephalopathy   1-UTI: UA with more than 50 white blood cell. Continue with IV ceftriaxone, day 2. Urine culture; Multiples species present.   2-Recurrent fall: Monitor on telemetry.  PT OT  3-Acute Metabolic Encephalopathy: Patient was noted to be more altered with increased hallucination.  CT head no acute abnormality.  Suspect related to UTI Agree with discontinuation of tramadol She was agitated last night.  Delirium precaution.   4-Dementia with behavioral disturbance and hallucination: Baseline patient is only alert and oriented to self and sometimes hallucinate. Continue with Lexapro, Depakote and Seroquel  5-AKI on CKD stage IV: Cr on  admission 2.7 BUN 41.   Baseline 2.2. Continue with IV fluids Improved.   6-Right toe fractures: Ortho recommend Cam boot, PT weightbearing as tolerated  Hypertension: Continue with amlodipine Paroxysmal A. fib: Noted to be a good candidate for anticoagulation  History of CVA; continue with aspirin and statins   Estimated body mass index is 18.89 kg/m as calculated from the following:   Height as of this encounter: '5\' 5"'$  (1.651 m).   Weight as of this encounter: 51.5 kg.   DVT prophylaxis: Heparin  Code Status: full code Family Communication: I try to contact family, they don't answer phone.  Disposition Plan:  Status is: Inpatient  Remains inpatient appropriate because:IV treatments appropriate due to intensity of illness or inability to take PO  Dispo: The patient is from: SNF              Anticipated d/c is to: SNF              Patient currently is not medically stable to d/c.   Difficult to place patient No        Consultants:  none Antimicrobials:  Ceftriaxone  Subjective: She didn't sleep last night , she was agitated. She was sleepy today. Wake up said few words.    Objective: Vitals:   03/18/21 2017 03/19/21 0100 03/19/21 0654 03/19/21 1049  BP: (!) 167/90  (!) 162/84 (!) 151/87  Pulse: 78  75 81  Resp: 18   17  Temp: 97.9 F (36.6 C)  (!) 97.4 F (36.3 C) 97.6 F (36.4 C)  TempSrc: Oral  Oral Oral  SpO2: 96%  99% 100%  Weight:  51.5 kg    Height:        Intake/Output Summary (Last 24 hours) at 03/19/2021 1323 Last data filed at 03/19/2021 1051 Gross per 24 hour  Intake 731.25 ml  Output 1000 ml  Net -268.75 ml    Filed Weights   03/16/21 1659 03/18/21 0043 03/19/21 0100  Weight: 52.2 kg 54.1 kg 51.5 kg    Examination:  General exam: NAD Respiratory system: CTA Cardiovascular system: S 1, S 2 RRR Gastrointestinal system: BS present, soft, nt Central nervous system: Alert, confuse Extremities: No edema  Data Reviewed: I have personally reviewed  following labs and imaging studies  CBC: Recent Labs  Lab 03/16/21 1708 03/19/21 0327  WBC 8.7 7.4  NEUTROABS 6.3  --   HGB 12.1 11.7*  HCT 37.6 34.8*  MCV 93.3 90.4  PLT 243 [id number]    Basic Metabolic Panel: Recent Labs  Lab 03/16/21 1708 03/18/21 1755 03/19/21 0327  NA 137 139 137  K 4.5 4.0 3.9  CL 106 107 109  CO2 20* 20* 17*  GLUCOSE 135* 119* 118*  BUN 41* 26* 28*  CREATININE 2.74* 2.07* 1.93*  CALCIUM 9.3 9.2 8.7*    GFR: Estimated Creatinine Clearance: 20.2 mL/min (A) (by C-G formula based on SCr of 1.93 mg/dL (H)). Liver Function Tests: Recent Labs  Lab 03/16/21 1708  AST 17  ALT 11  ALKPHOS 63  BILITOT 0.9  PROT 7.3  ALBUMIN 3.8    No results for input(s): LIPASE, AMYLASE in the last 168 hours. No results for input(s): AMMONIA in the last 168 hours. Coagulation Profile: No results for input(s): INR, PROTIME in the last 168 hours. Cardiac Enzymes: No results for input(s): CKTOTAL, CKMB, CKMBINDEX, TROPONINI in the last 168 hours. BNP (last 3 results) No results for input(s): PROBNP in the last 8760 hours. HbA1C: No results for input(s): HGBA1C in the last 72 hours. CBG: Recent Labs  Lab 03/17/21 0731  GLUCAP 136*    Lipid Profile: No results for input(s): CHOL, HDL, LDLCALC, TRIG, CHOLHDL, LDLDIRECT in the last 72 hours. Thyroid Function Tests: No results for input(s): TSH, T4TOTAL, FREET4, T3FREE, THYROIDAB in the last 72 hours. Anemia Panel: No results for input(s): VITAMINB12, FOLATE, FERRITIN, TIBC, IRON, RETICCTPCT in the last 72 hours. Sepsis Labs: No results for input(s): PROCALCITON, LATICACIDVEN in the last 168 hours.  Recent Results (from the past 240 hour(s))  Urine Culture     Status: Abnormal   Collection Time: 03/17/21  1:35 AM   Specimen: Urine, Clean Catch  Result Value Ref Range Status   Specimen Description URINE, CLEAN CATCH  Final   Special Requests   Final    NONE Performed at [facility], [address], [place], [place] [address]    Culture MULTIPLE SPECIES PRESENT, SUGGEST RECOLLECTION (A)  Final   Report Status 03/18/2021 FINAL  Final  Resp Panel by RT-PCR (Flu A&B, Covid) Nasopharyngeal Swab     Status: None   Collection Time: 03/17/21 11:00 AM   Specimen: Nasopharyngeal Swab; Nasopharyngeal(NP) swabs in vial transport medium  Result Value Ref Range Status   SARS Coronavirus 2 by RT PCR NEGATIVE NEGATIVE Final    Comment: (NOTE) SARS-CoV-2 target nucleic acids are NOT DETECTED.  The SARS-CoV-2 RNA is generally detectable in upper respiratory specimens during the acute phase of infection. The lowest concentration of SARS-CoV-2 viral copies this assay can detect is 138 copies/mL. A negative result does not preclude SARS-Cov-2 infection and should not  be used as the sole basis for treatment or other patient management decisions. A negative result may occur with  improper specimen collection/handling, submission of specimen other than nasopharyngeal swab, presence of viral mutation(s) within the areas targeted by this assay, and inadequate number of viral copies(<138 copies/mL). A negative result must be combined with clinical observations, patient history, and epidemiological information. The expected result is Negative.  Fact Sheet for Patients:  EntrepreneurPulse.com.au  Fact Sheet for Healthcare Providers:  IncredibleEmployment.be  This test is no t yet approved or cleared by the [place] FDA and  has been authorized for detection and/or diagnosis of SARS-CoV-2 by FDA under an Emergency Use Authorization (EUA). This EUA will remain  in effect (meaning this test can be used) for the duration of the COVID-19 declaration under Section 564(b)(1) of the Act, 21 U.S.C.section 360bbb-3(b)(1), unless the authorization is terminated  or revoked sooner.       Influenza A by PCR NEGATIVE NEGATIVE Final   Influenza B by PCR NEGATIVE NEGATIVE  Final    Comment: (NOTE) The Xpert Xpress SARS-CoV-2/FLU/RSV plus assay is intended as an aid in the diagnosis of influenza from Nasopharyngeal swab specimens and should not be used as a sole basis for treatment. Nasal washings and aspirates are unacceptable for Xpert Xpress SARS-CoV-2/FLU/RSV testing.  Fact Sheet for Patients: EntrepreneurPulse.com.au  Fact Sheet for Healthcare Providers: IncredibleEmployment.be  This test is not yet approved or cleared by the [place] FDA and has been authorized for detection and/or diagnosis of SARS-CoV-2 by FDA under an Emergency Use Authorization (EUA). This EUA will remain in effect (meaning this test can be used) for the duration of the COVID-19 declaration under Section 564(b)(1) of the Act, 21 U.S.C. section 360bbb-3(b)(1), unless the authorization is terminated or revoked.  Performed at [facility], [place] [address], [place], [place] [address]           Radiology Studies: DG Foot 2 Views Right  Result Date: 03/17/2021 CLINICAL DATA:  Unwitnessed fall. EXAM: RIGHT FOOT - 2 VIEW COMPARISON:  None. FINDINGS: An acute fracture is seen involving the mid portion of the fifth right metatarsal. There is no evidence of dislocation. There is no evidence of arthropathy or other focal bone abnormality. Soft tissue swelling is seen adjacent to the previously noted fracture site. IMPRESSION: Acute fracture of the fifth right metatarsal. Electronically Signed   By: Virgina Norfolk M.D.   On: 03/17/2021 19:24        Scheduled Meds:  amLODipine  10 mg Oral Daily   aspirin EC  81 mg Oral Daily   diclofenac Sodium  2 g Topical BID   divalproex  125 mg Oral BID   escitalopram  5 mg Oral Daily   feeding supplement  237 mL Oral TID BM   fenofibrate  54 mg Oral Daily   heparin  5,000 Units Subcutaneous Q8H   melatonin  10 mg Oral QHS   QUEtiapine  25 mg Oral QHS   rosuvastatin  5 mg Oral Daily    senna-docusate  1 tablet Oral QHS   sodium bicarbonate  650 mg Oral TID   sodium chloride flush  3 mL Intravenous Q12H   Continuous Infusions:  sodium chloride 75 mL/hr at 03/19/21 0100   cefTRIAXone (ROCEPHIN)  IV 1 g (03/19/21 0919)     LOS: 2 days    Time spent: 35 minutes.     Elmarie Shiley, MD [facility]   If 7PM-7AM, please contact  night-coverage [email or web address]  03/19/2021, 1:23 PM

## 2021-03-20 LAB — BASIC METABOLIC PANEL
Anion gap: 9 (ref 5–15)
BUN: 30 mg/dL — ABNORMAL HIGH (ref 8–23)
CO2: 21 mmol/L — ABNORMAL LOW (ref 22–32)
Calcium: 8.9 mg/dL (ref 8.9–10.3)
Chloride: 110 mmol/L (ref 98–111)
Creatinine, Ser: 1.97 mg/dL — ABNORMAL HIGH (ref 0.44–1.00)
GFR, Estimated: 26 mL/min — ABNORMAL LOW (ref >60)
Glucose, Bld: 147 mg/dL — ABNORMAL HIGH (ref 70–99)
Potassium: 4 mmol/L (ref 3.5–5.1)
Sodium: 140 mmol/L (ref 135–145)

## 2021-03-20 MED ORDER — BISACODYL 10 MG RE SUPP: 10.0000 mg | Freq: Once | RECTAL | Status: DC

## 2021-03-20 MED ORDER — BISACODYL 5 MG PO TBEC
5.0000 mg | DELAYED_RELEASE_TABLET | Freq: Once | ORAL | Status: AC
  Administered 2021-03-22: 5 mg via ORAL
  Filled 2021-03-20: qty 1

## 2021-03-20 NOTE — Plan of Care (Signed)

## 2021-03-20 NOTE — NC FL2 (Signed)
[place] LEVEL OF CARE SCREENING TOOL     IDENTIFICATION  Patient Name: Sandra Sheppard Birthdate: 08-21-1945 Sex: female Admission Date (Current Location): 03/16/2021  [facility] and [place] Number:  Herbalist and Address:  [facility], [place] [address], [place], [place] [address]      Provider Number: [id number]  Attending Physician Name and Address:  Elmarie Shiley, MD  Relative Name and Phone Number:  [phone]    Current Level of Care: Hospital Recommended Level of Care: [place] Prior Approval Number:    Date Approved/Denied:   PASRR Number: [id number]  Discharge Plan: SNF    Current Diagnoses: Patient Active Problem List   Diagnosis Date Noted   UTI (urinary tract infection) 03/17/2021   Fall 03/17/2021   Closed fracture of right toe 03/17/2021   Dementia with behavioral disturbance Q000111Q   Acute metabolic encephalopathy Q000111Q   Syncope and collapse 08/17/2020   Protein-calorie malnutrition, severe 08/17/2020   Near syncope 08/14/2020   CKD (chronic kidney disease) stage 4, GFR 15-29 ml/min ([place]) 08/13/2020   Tobacco abuse 09/22/2019   Educated about COVID-19 virus infection 09/22/2019   Asymptomatic bilateral carotid artery stenosis 09/22/2019   Acute kidney injury superimposed on CKD [facility])    Palliative care encounter    Hypocalcemia    FTT (failure to thrive) in adult    AKI (acute kidney injury) ([place]) 08/08/2018   Hyperkalemia [id number]   Metabolic acidosis, increased anion gap 08/08/2018   Normocytic anemia 08/08/2018   Diabetes mellitus type II, non insulin dependent ([place]) 08/08/2018   Bilateral carotid artery disease ([place]) 07/12/2018   Dyslipidemia 07/12/2018   Vomiting 07/04/2016   Gastroenteritis 07/04/2016   CKD (chronic kidney disease) stage 3, GFR 30-59 ml/min (HCC) 07/04/2016   Occlusion and stenosis of carotid artery 04/28/2010   ABDOMINAL AORTIC ANEURYSM  04/28/2010   Peripheral vascular disease ([place]) 12/08/2008   STENOSIS, MITRAL AND AORTIC VALVES 10/07/2008   Paroxysmal atrial fibrillation ([place]) 10/07/2008   H/O aortic valve replacement 08/14/2008   HYPERCHOLESTEROLEMIA  IIA 06/23/2008   HYPERTENSION, MILD 06/23/2008   MURMUR 06/23/2008    Orientation RESPIRATION BLADDER Height & Weight     Self  Normal Incontinent, External catheter Weight: 114 lb 10.2 oz (52 kg) Height:  '5\' 5"'$  (165.1 cm)  BEHAVIORAL SYMPTOMS/MOOD NEUROLOGICAL BOWEL NUTRITION STATUS      Incontinent Diet (See DC summary)  AMBULATORY STATUS COMMUNICATION OF NEEDS Skin   Extensive Assist Verbally Other (Comment) (flaky,dry,ecchymosis, arm-bilateral)                       Personal Care Assistance Level of Assistance  Bathing, Feeding, Dressing, Total care Bathing Assistance: Maximum assistance Feeding assistance: Limited assistance Dressing Assistance: Maximum assistance Total Care Assistance: Maximum assistance   Functional Limitations Info  Sight, Hearing, Speech Sight Info: Impaired Hearing Info: Adequate Speech Info: Adequate    SPECIAL CARE FACTORS FREQUENCY  PT (By licensed PT), OT (By licensed OT)     PT Frequency: 5x per week OT Frequency: 5x per week            Contractures Contractures Info: Not present    Additional Factors Info  Code Status, Allergies, Psychotropic Code Status Info: Full Allergies Info: Ativan (Lorazepam), Codeine, Sulfonamide Derivatives, Penicillins Psychotropic Info: ecitalopram (LEXAPRO),divalproex (DEPAKOTE)         Current Medications (03/20/2021):  This is the current hospital active medication list  Current Facility-Administered Medications  Medication Dose Route Frequency Provider Last Rate Last Admin   0.9 %  sodium chloride infusion   Intravenous Continuous Fuller Plan A, MD 75 mL/hr at 03/19/21 2110 New Bag at 03/19/21 2110   acetaminophen (TYLENOL) tablet 650 mg  650 mg Oral Q6H PRN Norval Morton, MD   650 mg at 03/20/21 [id number]   Or   acetaminophen (TYLENOL) suppository 650 mg  650 mg Rectal Q6H PRN Fuller Plan A, MD       albuterol (PROVENTIL) (2.5 MG/3ML) 0.083% nebulizer solution 2.5 mg  2.5 mg Nebulization Q6H PRN Tamala Julian, Rondell A, MD       amLODipine (NORVASC) tablet 10 mg  10 mg Oral Daily Regalado, Belkys A, MD   10 mg at 03/20/21 0951   aspirin EC tablet 81 mg  81 mg Oral Daily Smith, Rondell A, MD   81 mg at 03/20/21 0951   bisacodyl (DULCOLAX) EC tablet 5 mg  5 mg Oral Once Regalado, Belkys A, MD       cefTRIAXone (ROCEPHIN) 1 g in sodium chloride 0.9 % 100 mL IVPB  1 g Intravenous Q24H Smith, Rondell A, MD 200 mL/hr at 03/20/21 0954 1 g at 03/20/21 0954   diclofenac Sodium (VOLTAREN) 1 % topical gel 2 g  2 g Topical BID Fuller Plan A, MD   2 g at 03/20/21 0950   divalproex (DEPAKOTE) DR tablet 125 mg  125 mg Oral BID Fuller Plan A, MD   125 mg at 03/20/21 0950   escitalopram (LEXAPRO) tablet 5 mg  5 mg Oral Daily Tamala Julian, Rondell A, MD   5 mg at 03/20/21 0951   feeding supplement (ENSURE ENLIVE / ENSURE PLUS) liquid 237 mL  237 mL Oral TID BM Smith, Rondell A, MD   237 mL at 03/20/21 0950   fenofibrate tablet 54 mg  54 mg Oral Daily Smith, Rondell A, MD   54 mg at 03/20/21 0950   heparin injection 5,000 Units  5,000 Units Subcutaneous Q8H Heloise Purpura, RPH   5,000 Units at 03/20/21 0610   loratadine (CLARITIN) tablet 10 mg  10 mg Oral Daily PRN Norval Morton, MD       meclizine (ANTIVERT) tablet 12.5 mg  12.5 mg Oral TID PRN Fuller Plan A, MD       melatonin tablet 10 mg  10 mg Oral QHS Smith, Rondell A, MD   10 mg at 03/19/21 2057   QUEtiapine (SEROQUEL) tablet 25 mg  25 mg Oral QHS Smith, Rondell A, MD   25 mg at 03/19/21 2057   rosuvastatin (CRESTOR) tablet 5 mg  5 mg Oral Daily Tamala Julian, Rondell A, MD   5 mg at 03/20/21 0951   senna-docusate (Senokot-S) tablet 1 tablet  1 tablet Oral QHS Smith, Rondell A, MD   1 tablet at 03/19/21 2057   sodium  bicarbonate tablet 650 mg  650 mg Oral TID Regalado, Belkys A, MD   650 mg at 03/20/21 0951   sodium chloride flush (NS) 0.9 % injection 3 mL  3 mL Intravenous Q12H Fuller Plan A, MD   3 mL at 03/20/21 [id number]     Discharge Medications: Please see discharge summary for a list of discharge medications.  Relevant Imaging Results:  Relevant Lab Results:   Additional Information SSN# [id number]  Bary Castilla, LCSW

## 2021-03-20 NOTE — Progress Notes (Signed)
PROGRESS NOTE    Sandra Sheppard  [id number] DOB: June 28, 1945 DOA: 03/16/2021 PCP: Prince Solian, MD   Brief Narrative: 75 year old with past medical history significant for paroxysmal A. fib, hypertension, CKD stage IV, dementia who presents after having an unwitnessed fall the day prior to admission.  Unclear if patient try to get herself out of the wheelchair or possible pass out.  Patient is not able to give significant history due to her dementia.  Patient has become more disoriented with increased hallucination and repetitive questioning.  Patient has been unable to feed herself lately.  Had a prior admission due to suspected syncopal episode due to orthostatic hypotension.  Evaluation in the ED CT head cervical spine did not show any acute abnormality.  Creatinine 2.4, chest x-ray low lung volume, pelvis x-ray did not show any acute fracture.  UA with large leukocytes greater than 50 white blood cell.  Assessment & Plan:   Principal Problem:   UTI (urinary tract infection) Active Problems:   Paroxysmal atrial fibrillation (HCC)   Dyslipidemia   Acute kidney injury superimposed on CKD ([place])   Tobacco abuse   Fall   Closed fracture of right toe   Dementia with behavioral disturbance   Acute metabolic encephalopathy   1-UTI: UA with more than 50 white blood cell. Continue with IV ceftriaxone, day 3. Urine culture; Multiples species present.   2-Recurrent fall: Monitor on telemetry.  PT OT  3-Acute Metabolic Encephalopathy: Patient was noted to be more altered with increased hallucination.  CT head no acute abnormality.  Suspect related to UTI Tramadol was discontinue.  Stable.  Delirium precaution.   4-Dementia with behavioral disturbance and hallucination: Baseline patient is only alert and oriented to self and sometimes hallucinate. Continue with Lexapro, Depakote and Seroquel  5-AKI on CKD stage IV: Cr on  admission 2.7 BUN 41.  Baseline 2.2. Decrease IV  fluids rate.  Improved. Cr down 1.9  6-Right toe fractures: Ortho recommend Cam boot, PT weightbearing as tolerated  Hypertension: Continue with amlodipine Paroxysmal A. fib: Noted to be a good candidate for anticoagulation  History of CVA; continue with aspirin and statins   Estimated body mass index is 19.08 kg/m as calculated from the following:   Height as of this encounter: '5\' 5"'$  (1.651 m).   Weight as of this encounter: 52 kg.   DVT prophylaxis: Heparin  Code Status: full code Family Communication: sister over phone 10/07 Disposition Plan:  Status is: Inpatient  Remains inpatient appropriate because:IV treatments appropriate due to intensity of illness or inability to take PO  Dispo: The patient is from: SNF              Anticipated d/c is to: SNF              Patient currently is not medically stable to d/c.   Difficult to place patient No        Consultants:  none Antimicrobials:  Ceftriaxone  Subjective: She is alert, denies pain. She has not had BM   Objective: Vitals:   03/19/21 2035 03/20/21 0408 03/20/21 0728 03/20/21 1123  BP: (!) 139/100 (!) 143/72 (!) 151/68 (!) 132/57  Pulse: 66 67 71 78  Resp: 20 20    Temp: 98.1 F (36.7 C) 99.2 F (37.3 C)    TempSrc: Oral Oral    SpO2: 98% 98% 96% 93%  Weight:  52 kg    Height:        Intake/Output Summary (Last 24 hours)  at 03/20/2021 1300 Last data filed at 03/20/2021 1256 Gross per 24 hour  Intake 1512 ml  Output 450 ml  Net 1062 ml    Filed Weights   03/18/21 0043 03/19/21 0100 03/20/21 0408  Weight: 54.1 kg 51.5 kg 52 kg    Examination:  General exam: NAD Respiratory system: CTA Cardiovascular system: S 1, S 2 RRR Gastrointestinal system: BS present, soft, nt Central nervous system: alert, confuse Extremities: No edema  Data Reviewed: I have personally reviewed following labs and imaging studies  CBC: Recent Labs  Lab 03/16/21 1708 03/19/21 0327  WBC 8.7 7.4  NEUTROABS  6.3  --   HGB 12.1 11.7*  HCT 37.6 34.8*  MCV 93.3 90.4  PLT 243 [id number]    Basic Metabolic Panel: Recent Labs  Lab 03/16/21 1708 03/18/21 1755 03/19/21 0327 03/20/21 0311  NA 137 139 137 140  K 4.5 4.0 3.9 4.0  CL 106 107 109 110  CO2 20* 20* 17* 21*  GLUCOSE 135* 119* 118* 147*  BUN 41* 26* 28* 30*  CREATININE 2.74* 2.07* 1.93* 1.97*  CALCIUM 9.3 9.2 8.7* 8.9    GFR: Estimated Creatinine Clearance: 19.9 mL/min (A) (by C-G formula based on SCr of 1.97 mg/dL (H)). Liver Function Tests: Recent Labs  Lab 03/16/21 1708  AST 17  ALT 11  ALKPHOS 63  BILITOT 0.9  PROT 7.3  ALBUMIN 3.8    No results for input(s): LIPASE, AMYLASE in the last 168 hours. No results for input(s): AMMONIA in the last 168 hours. Coagulation Profile: No results for input(s): INR, PROTIME in the last 168 hours. Cardiac Enzymes: No results for input(s): CKTOTAL, CKMB, CKMBINDEX, TROPONINI in the last 168 hours. BNP (last 3 results) No results for input(s): PROBNP in the last 8760 hours. HbA1C: No results for input(s): HGBA1C in the last 72 hours. CBG: Recent Labs  Lab 03/17/21 0731  GLUCAP 136*    Lipid Profile: No results for input(s): CHOL, HDL, LDLCALC, TRIG, CHOLHDL, LDLDIRECT in the last 72 hours. Thyroid Function Tests: No results for input(s): TSH, T4TOTAL, FREET4, T3FREE, THYROIDAB in the last 72 hours. Anemia Panel: No results for input(s): VITAMINB12, FOLATE, FERRITIN, TIBC, IRON, RETICCTPCT in the last 72 hours. Sepsis Labs: No results for input(s): PROCALCITON, LATICACIDVEN in the last 168 hours.  Recent Results (from the past 240 hour(s))  Urine Culture     Status: Abnormal   Collection Time: 03/17/21  1:35 AM   Specimen: Urine, Clean Catch  Result Value Ref Range Status   Specimen Description URINE, CLEAN CATCH  Final   Special Requests   Final    NONE Performed at [facility], [address], [place], [place] [address]    Culture MULTIPLE SPECIES PRESENT,  SUGGEST RECOLLECTION (A)  Final   Report Status 03/18/2021 FINAL  Final  Resp Panel by RT-PCR (Flu A&B, Covid) Nasopharyngeal Swab     Status: None   Collection Time: 03/17/21 11:00 AM   Specimen: Nasopharyngeal Swab; Nasopharyngeal(NP) swabs in vial transport medium  Result Value Ref Range Status   SARS Coronavirus 2 by RT PCR NEGATIVE NEGATIVE Final    Comment: (NOTE) SARS-CoV-2 target nucleic acids are NOT DETECTED.  The SARS-CoV-2 RNA is generally detectable in upper respiratory specimens during the acute phase of infection. The lowest concentration of SARS-CoV-2 viral copies this assay can detect is 138 copies/mL. A negative result does not preclude SARS-Cov-2 infection and should not be used as the sole basis for treatment or other  patient management decisions. A negative result may occur with  improper specimen collection/handling, submission of specimen other than nasopharyngeal swab, presence of viral mutation(s) within the areas targeted by this assay, and inadequate number of viral copies(<138 copies/mL). A negative result must be combined with clinical observations, patient history, and epidemiological information. The expected result is Negative.  Fact Sheet for Patients:  EntrepreneurPulse.com.au  Fact Sheet for Healthcare Providers:  IncredibleEmployment.be  This test is no t yet approved or cleared by the [place] FDA and  has been authorized for detection and/or diagnosis of SARS-CoV-2 by FDA under an Emergency Use Authorization (EUA). This EUA will remain  in effect (meaning this test can be used) for the duration of the COVID-19 declaration under Section 564(b)(1) of the Act, 21 U.S.C.section 360bbb-3(b)(1), unless the authorization is terminated  or revoked sooner.       Influenza A by PCR NEGATIVE NEGATIVE Final   Influenza B by PCR NEGATIVE NEGATIVE Final    Comment: (NOTE) The Xpert Xpress SARS-CoV-2/FLU/RSV plus  assay is intended as an aid in the diagnosis of influenza from Nasopharyngeal swab specimens and should not be used as a sole basis for treatment. Nasal washings and aspirates are unacceptable for Xpert Xpress SARS-CoV-2/FLU/RSV testing.  Fact Sheet for Patients: EntrepreneurPulse.com.au  Fact Sheet for Healthcare Providers: IncredibleEmployment.be  This test is not yet approved or cleared by the [place] FDA and has been authorized for detection and/or diagnosis of SARS-CoV-2 by FDA under an Emergency Use Authorization (EUA). This EUA will remain in effect (meaning this test can be used) for the duration of the COVID-19 declaration under Section 564(b)(1) of the Act, 21 U.S.C. section 360bbb-3(b)(1), unless the authorization is terminated or revoked.  Performed at [facility], [place] [address], [place], [place] [address]           Radiology Studies: No results found.      Scheduled Meds:  amLODipine  10 mg Oral Daily   aspirin EC  81 mg Oral Daily   bisacodyl  5 mg Oral Once   diclofenac Sodium  2 g Topical BID   divalproex  125 mg Oral BID   escitalopram  5 mg Oral Daily   feeding supplement  237 mL Oral TID BM   fenofibrate  54 mg Oral Daily   heparin  5,000 Units Subcutaneous Q8H   melatonin  10 mg Oral QHS   QUEtiapine  25 mg Oral QHS   rosuvastatin  5 mg Oral Daily   senna-docusate  1 tablet Oral QHS   sodium bicarbonate  650 mg Oral TID   sodium chloride flush  3 mL Intravenous Q12H   Continuous Infusions:  sodium chloride 75 mL/hr at 03/19/21 2110   cefTRIAXone (ROCEPHIN)  IV 1 g (03/20/21 0954)     LOS: 3 days    Time spent: 35 minutes.     Elmarie Shiley, MD [facility]   If 7PM-7AM, please contact night-coverage [email or web address]  03/20/2021, 1:00 PM

## 2021-03-20 NOTE — TOC Initial Note (Signed)
Transition of Care [facility]) - Initial/Assessment Note    Patient Details  Name: Sandra Sheppard MRN: [id number] Date of Birth: 06-17-1945  Transition of Care [facility]) CM/SW Contact:    Bary Castilla, LCSW Phone Number:336 [phone] 03/20/2021, 12:30 PM  Clinical Narrative:                  CSW met with patient's daughter [name] to discuss PT recommendation of a SNF. [name] is in agreement with going to a [facility]. [name] explained that pt has been at [facility] since March 2022 and the plan is for her to return.   CSW spoke with Gilman Schmidt at [facility] and she confirmed that pt is a resident at facility. CSW explained that recommendation of a SNF.Gilman Schmidt informed CSW that she will start [facility] today however most likely will be back over the weekend.  Pt will need COVID test to return to facility.  [name] is the point of contact for pt.  TOC team will continue to assist with discharge planning needs.      Expected Discharge Plan: Skilled Nursing Facility Barriers to Discharge: Insurance Authorization   Patient Goals and CMS Choice        Expected Discharge Plan and Services Expected Discharge Plan: [place]       Living arrangements for the past 2 months: [place]                                      Prior Living Arrangements/Services Living arrangements for the past 2 months: [place] Lives with:: Facility Resident Patient language and need for interpreter reviewed:: Yes          Care giver support system in place?: Yes (comment)      Activities of Daily Living      Permission Sought/Granted      Share Information with NAME: [name]  Permission granted to share info w AGENCY: [facility]  Permission granted to share info w Relationship: daughter  Permission granted to share info w Contact Information: [phone]  Emotional Assessment Appearance:: Appears stated age Attitude/Demeanor/Rapport: Unable to  Assess Affect (typically observed): Unable to Assess Orientation: : Oriented to Self      Admission diagnosis:  Lower urinary tract infectious disease [N39.0] UTI (urinary tract infection) [N39.0] Toe fracture, right [S92.911A] Fall, initial encounter [W19.XXXA] Syncope, unspecified syncope type [R55] Patient Active Problem List   Diagnosis Date Noted   UTI (urinary tract infection) 03/17/2021   Fall 03/17/2021   Closed fracture of right toe 03/17/2021   Dementia with behavioral disturbance 80/99/8338   Acute metabolic encephalopathy 25/10/3974   Syncope and collapse 08/17/2020   Protein-calorie malnutrition, severe 08/17/2020   Near syncope 08/14/2020   CKD (chronic kidney disease) stage 4, GFR 15-29 ml/min ([place]) 08/13/2020   Tobacco abuse 09/22/2019   Educated about COVID-19 virus infection 09/22/2019   Asymptomatic bilateral carotid artery stenosis 09/22/2019   Acute kidney injury superimposed on CKD [facility])    Palliative care encounter    Hypocalcemia    FTT (failure to thrive) in adult    AKI (acute kidney injury) ([place]) 08/08/2018   Hyperkalemia 73/41/9379   Metabolic acidosis, increased anion gap 08/08/2018   Normocytic anemia 08/08/2018   Diabetes mellitus type II, non insulin dependent ([place]) 08/08/2018   Bilateral carotid artery disease ([place]) 07/12/2018   Dyslipidemia 07/12/2018   Vomiting 07/04/2016  Gastroenteritis 07/04/2016   CKD (chronic kidney disease) stage 3, GFR 30-59 ml/min (HCC) 07/04/2016   Occlusion and stenosis of carotid artery 04/28/2010   ABDOMINAL AORTIC ANEURYSM 04/28/2010   Peripheral vascular disease ([place]) 12/08/2008   STENOSIS, MITRAL AND AORTIC VALVES 10/07/2008   Paroxysmal atrial fibrillation ([place]) 10/07/2008   H/O aortic valve replacement 08/14/2008   HYPERCHOLESTEROLEMIA  IIA 06/23/2008   HYPERTENSION, MILD 06/23/2008   MURMUR 06/23/2008   PCP:  Prince Solian, MD Pharmacy:   [facility] [address], [place] - [address] [place] [place] [address] Phone: [phone] Fax: [phone]     Social Determinants of Health (SDOH) Interventions    Readmission Risk Interventions No flowsheet data found.

## 2021-03-21 DIAGNOSIS — N3000 Acute cystitis without hematuria: Secondary | ICD-10-CM | POA: Diagnosis not present

## 2021-03-21 NOTE — Progress Notes (Signed)
PROGRESS NOTE    Sandra Sheppard  [id number] DOB: 08-09-1945 DOA: 03/16/2021 PCP: Prince Solian, MD   Brief Narrative: 75 year old with past medical history significant for paroxysmal A. fib, hypertension, CKD stage IV, dementia who presents after having an unwitnessed fall the day prior to admission.  Unclear if patient try to get herself out of the wheelchair or possible pass out.  Patient is not able to give significant history due to her dementia.  Patient has become more disoriented with increased hallucination and repetitive questioning.  Patient has been unable to feed herself lately.  Had a prior admission due to suspected syncopal episode due to orthostatic hypotension.  Evaluation in the ED CT head cervical spine did not show any acute abnormality.  Creatinine 2.4, chest x-ray low lung volume, pelvis x-ray did not show any acute fracture.  UA with large leukocytes greater than 50 white blood cell.  Assessment & Plan:   Principal Problem:   UTI (urinary tract infection) Active Problems:   Paroxysmal atrial fibrillation (HCC)   Dyslipidemia   Acute kidney injury superimposed on CKD ([place])   Tobacco abuse   Fall   Closed fracture of right toe   Dementia with behavioral disturbance   Acute metabolic encephalopathy   1-UTI: UA with more than 50 white blood cell. Completed IV ceftriaxone, for 3 days.  Urine culture; Multiples species present.   2-Recurrent fall: Monitor on telemetry.  PT OT  3-Acute Metabolic Encephalopathy: Patient was noted to be more altered with increased hallucination.  CT head no acute abnormality.  Suspect related to UTI Tramadol was discontinue.  Continue to be stable, pleasantly confuse Delirium precaution.   4-Dementia with behavioral disturbance and hallucination: Baseline patient is only alert and oriented to self and sometimes hallucinate. Continue with Lexapro, Depakote and Seroquel  5-AKI on CKD stage IV: Cr on  admission 2.7 BUN  41.  Baseline 2.2. Decrease IV fluids rate.  Improved. Cr down 1.9  6-Right toe fractures: Ortho recommend Cam boot, PT weightbearing as tolerated  Hypertension: Continue with amlodipine Paroxysmal A. fib: Noted to be a good candidate for anticoagulation  History of CVA; continue with aspirin and statins   Estimated body mass index is 19.44 kg/m as calculated from the following:   Height as of this encounter: '5\' 5"'$  (1.651 m).   Weight as of this encounter: 53 kg.   DVT prophylaxis: Heparin  Code Status: full code Family Communication: sister over phone 10/07 Disposition Plan:  Status is: Inpatient  Remains inpatient appropriate because:IV treatments appropriate due to intensity of illness or inability to take PO  Dispo: The patient is from: SNF              Anticipated d/c is to: SNF              Patient currently is not medically stable to d/c.   Difficult to place patient No        Consultants:  none Antimicrobials:  Ceftriaxone  Subjective: Alert to person, not place, denies pain   Objective: Vitals:   03/20/21 0728 03/20/21 1123 03/21/21 0546 03/21/21 1313  BP: (!) 151/68 (!) 132/57 (!) 157/64 (!) 155/63  Pulse: 71 78 (!) 56 62  Resp:   17 17  Temp:    (!) 97.4 F (36.3 C)  TempSrc:    Oral  SpO2: 96% 93% 97% 96%  Weight:   53 kg   Height:        Intake/Output Summary (Last 24  hours) at 03/21/2021 1411 Last data filed at 03/21/2021 0843 Gross per 24 hour  Intake 240 ml  Output 1050 ml  Net -810 ml    Filed Weights   03/19/21 0100 03/20/21 0408 03/21/21 0546  Weight: 51.5 kg 52 kg 53 kg    Examination:  General exam: NAD Respiratory system: CTA Cardiovascular system: S 1,  S 2 RRR Gastrointestinal system: BS present, soft , nt Central nervous system: Alert Extremities: no edema  Data Reviewed: I have personally reviewed following labs and imaging studies  CBC: Recent Labs  Lab 03/16/21 1708 03/19/21 0327  WBC 8.7 7.4   NEUTROABS 6.3  --   HGB 12.1 11.7*  HCT 37.6 34.8*  MCV 93.3 90.4  PLT 243 [id number]    Basic Metabolic Panel: Recent Labs  Lab 03/16/21 1708 03/18/21 1755 03/19/21 0327 03/20/21 0311  NA 137 139 137 140  K 4.5 4.0 3.9 4.0  CL 106 107 109 110  CO2 20* 20* 17* 21*  GLUCOSE 135* 119* 118* 147*  BUN 41* 26* 28* 30*  CREATININE 2.74* 2.07* 1.93* 1.97*  CALCIUM 9.3 9.2 8.7* 8.9    GFR: Estimated Creatinine Clearance: 20.3 mL/min (A) (by C-G formula based on SCr of 1.97 mg/dL (H)). Liver Function Tests: Recent Labs  Lab 03/16/21 1708  AST 17  ALT 11  ALKPHOS 63  BILITOT 0.9  PROT 7.3  ALBUMIN 3.8    No results for input(s): LIPASE, AMYLASE in the last 168 hours. No results for input(s): AMMONIA in the last 168 hours. Coagulation Profile: No results for input(s): INR, PROTIME in the last 168 hours. Cardiac Enzymes: No results for input(s): CKTOTAL, CKMB, CKMBINDEX, TROPONINI in the last 168 hours. BNP (last 3 results) No results for input(s): PROBNP in the last 8760 hours. HbA1C: No results for input(s): HGBA1C in the last 72 hours. CBG: Recent Labs  Lab 03/17/21 0731  GLUCAP 136*    Lipid Profile: No results for input(s): CHOL, HDL, LDLCALC, TRIG, CHOLHDL, LDLDIRECT in the last 72 hours. Thyroid Function Tests: No results for input(s): TSH, T4TOTAL, FREET4, T3FREE, THYROIDAB in the last 72 hours. Anemia Panel: No results for input(s): VITAMINB12, FOLATE, FERRITIN, TIBC, IRON, RETICCTPCT in the last 72 hours. Sepsis Labs: No results for input(s): PROCALCITON, LATICACIDVEN in the last 168 hours.  Recent Results (from the past 240 hour(s))  Urine Culture     Status: Abnormal   Collection Time: 03/17/21  1:35 AM   Specimen: Urine, Clean Catch  Result Value Ref Range Status   Specimen Description URINE, CLEAN CATCH  Final   Special Requests   Final    NONE Performed at [facility], [address] [place], [place] [address]    Culture MULTIPLE SPECIES  PRESENT, SUGGEST RECOLLECTION (A)  Final   Report Status 03/18/2021 FINAL  Final  Resp Panel by RT-PCR (Flu A&B, Covid) Nasopharyngeal Swab     Status: None   Collection Time: 03/17/21 11:00 AM   Specimen: Nasopharyngeal Swab; Nasopharyngeal(NP) swabs in vial transport medium  Result Value Ref Range Status   SARS Coronavirus 2 by RT PCR NEGATIVE NEGATIVE Final    Comment: (NOTE) SARS-CoV-2 target nucleic acids are NOT DETECTED.  The SARS-CoV-2 RNA is generally detectable in upper respiratory specimens during the acute phase of infection. The lowest concentration of SARS-CoV-2 viral copies this assay can detect is 138 copies/mL. A negative result does not preclude SARS-Cov-2 infection and should not be used as the sole basis for treatment  or other patient management decisions. A negative result may occur with  improper specimen collection/handling, submission of specimen other than nasopharyngeal swab, presence of viral mutation(s) within the areas targeted by this assay, and inadequate number of viral copies(<138 copies/mL). A negative result must be combined with clinical observations, patient history, and epidemiological information. The expected result is Negative.  Fact Sheet for Patients:  EntrepreneurPulse.com.au  Fact Sheet for Healthcare Providers:  IncredibleEmployment.be  This test is no t yet approved or cleared by the [place] FDA and  has been authorized for detection and/or diagnosis of SARS-CoV-2 by FDA under an Emergency Use Authorization (EUA). This EUA will remain  in effect (meaning this test can be used) for the duration of the COVID-19 declaration under Section 564(b)(1) of the Act, 21 U.S.C.section 360bbb-3(b)(1), unless the authorization is terminated  or revoked sooner.       Influenza A by PCR NEGATIVE NEGATIVE Final   Influenza B by PCR NEGATIVE NEGATIVE Final    Comment: (NOTE) The Xpert Xpress  SARS-CoV-2/FLU/RSV plus assay is intended as an aid in the diagnosis of influenza from Nasopharyngeal swab specimens and should not be used as a sole basis for treatment. Nasal washings and aspirates are unacceptable for Xpert Xpress SARS-CoV-2/FLU/RSV testing.  Fact Sheet for Patients: EntrepreneurPulse.com.au  Fact Sheet for Healthcare Providers: IncredibleEmployment.be  This test is not yet approved or cleared by the [place] FDA and has been authorized for detection and/or diagnosis of SARS-CoV-2 by FDA under an Emergency Use Authorization (EUA). This EUA will remain in effect (meaning this test can be used) for the duration of the COVID-19 declaration under Section 564(b)(1) of the Act, 21 U.S.C. section 360bbb-3(b)(1), unless the authorization is terminated or revoked.  Performed at [facility], [place] [address] [place], [place] [address]           Radiology Studies: No results found.      Scheduled Meds:  amLODipine  10 mg Oral Daily   aspirin EC  81 mg Oral Daily   bisacodyl  5 mg Oral Once   diclofenac Sodium  2 g Topical BID   divalproex  125 mg Oral BID   escitalopram  5 mg Oral Daily   feeding supplement  237 mL Oral TID BM   fenofibrate  54 mg Oral Daily   heparin  5,000 Units Subcutaneous Q8H   melatonin  10 mg Oral QHS   QUEtiapine  25 mg Oral QHS   rosuvastatin  5 mg Oral Daily   senna-docusate  1 tablet Oral QHS   sodium bicarbonate  650 mg Oral TID   sodium chloride flush  3 mL Intravenous Q12H   Continuous Infusions:  sodium chloride 50 mL/hr at 03/20/21 2251   cefTRIAXone (ROCEPHIN)  IV 1 g (03/21/21 0904)     LOS: 4 days    Time spent: 35 minutes.     Elmarie Shiley, MD [facility]   If 7PM-7AM, please contact night-coverage [email or web address]  03/21/2021, 2:11 PM

## 2021-03-22 DIAGNOSIS — N3000 Acute cystitis without hematuria: Secondary | ICD-10-CM | POA: Diagnosis not present

## 2021-03-22 LAB — RESP PANEL BY RT-PCR (FLU A&B, COVID) ARPGX2
Influenza A by PCR: NEGATIVE
Influenza B by PCR: NEGATIVE
SARS Coronavirus 2 by RT PCR: NEGATIVE

## 2021-03-22 LAB — BASIC METABOLIC PANEL
Anion gap: 8 (ref 5–15)
BUN: 27 mg/dL — ABNORMAL HIGH (ref 8–23)
CO2: 27 mmol/L (ref 22–32)
Calcium: 9.1 mg/dL (ref 8.9–10.3)
Chloride: 101 mmol/L (ref 98–111)
Creatinine, Ser: 2.27 mg/dL — ABNORMAL HIGH (ref 0.44–1.00)
GFR, Estimated: 22 mL/min — ABNORMAL LOW (ref >60)
Glucose, Bld: 227 mg/dL — ABNORMAL HIGH (ref 70–99)
Potassium: 4.5 mmol/L (ref 3.5–5.1)
Sodium: 136 mmol/L (ref 135–145)

## 2021-03-22 MED ORDER — AMLODIPINE BESYLATE 10 MG PO TABS: 10.0000 mg | ORAL_TABLET | Freq: Every day | ORAL | 1 refills | Status: AC

## 2021-03-22 MED ORDER — SODIUM CHLORIDE 0.9 % IV SOLN: INTRAVENOUS | Status: DC

## 2021-03-22 MED ORDER — DICLOFENAC SODIUM 1 % EX GEL: 2.0000 g | Freq: Two times a day (BID) | CUTANEOUS | 0 refills | Status: DC

## 2021-03-22 MED ORDER — SODIUM BICARBONATE 650 MG PO TABS: 650.0000 mg | ORAL_TABLET | Freq: Three times a day (TID) | ORAL | 1 refills | Status: AC

## 2021-03-22 NOTE — Progress Notes (Signed)
PROGRESS NOTE    Sandra Sheppard  [id number] DOB: 12-09-1945 DOA: 03/16/2021 PCP: Prince Solian, MD   Brief Narrative: 75 year old with past medical history significant for paroxysmal A. fib, hypertension, CKD stage IV, dementia who presents after having an unwitnessed fall the day prior to admission.  Unclear if patient try to get herself out of the wheelchair or possible pass out.  Patient is not able to give significant history due to her dementia.  Patient has become more disoriented with increased hallucination and repetitive questioning.  Patient has been unable to feed herself lately.  Had a prior admission due to suspected syncopal episode due to orthostatic hypotension.  Evaluation in the ED CT head cervical spine did not show any acute abnormality.  Creatinine 2.4, chest x-ray low lung volume, pelvis x-ray did not show any acute fracture.  UA with large leukocytes greater than 50 white blood cell.  Assessment & Plan:   Principal Problem:   UTI (urinary tract infection) Active Problems:   Paroxysmal atrial fibrillation (HCC)   Dyslipidemia   Acute kidney injury superimposed on CKD ([place])   Tobacco abuse   Fall   Closed fracture of right toe   Dementia with behavioral disturbance   Acute metabolic encephalopathy   1-UTI: UA with more than 50 white blood cell. Completed IV ceftriaxone, for 3 days.  Urine culture; Multiples species present.   2-Recurrent fall: Monitor on telemetry.  PT/OT.  3-Acute Metabolic Encephalopathy: Patient was noted to be more altered with increased hallucination.  CT head no acute abnormality.  Suspect related to UTI Tramadol was discontinue.  Continue to be stable, pleasantly confuse Delirium precaution.   4-Dementia with behavioral disturbance and hallucination: Baseline patient is only alert and oriented to self and sometimes hallucinate. Continue with Lexapro, Depakote and Seroquel  5-AKI on CKD stage IV: Cr on  admission 2.7 BUN  41.  Baseline 2.2. Decrease IV fluids rate.  Improved. Cr down 1.9  6-Right toe fractures: Ortho recommend Cam boot, PT weightbearing as tolerated  Hypertension: Continue with amlodipine Paroxysmal A. fib: Noted to be a good candidate for anticoagulation  History of CVA; continue with aspirin and statins   Estimated body mass index is 19.41 kg/m as calculated from the following:   Height as of this encounter: '5\' 5"'$  (1.651 m).   Weight as of this encounter: 52.9 kg.   DVT prophylaxis: Heparin  Code Status: full code Family Communication: sister over phone 10/07 Disposition Plan:  Status is: Inpatient  Remains inpatient appropriate because:IV treatments appropriate due to intensity of illness or inability to take PO  Dispo: The patient is from: SNF              Anticipated d/c is to: SNF              Patient currently is not medically stable to d/c.   Difficult to place patient No        Consultants:  none Antimicrobials:  Ceftriaxone  Subjective: Alert, no new complaint,. Nurse has been feeding her.   Objective: Vitals:   03/21/21 1313 03/21/21 2042 03/22/21 0521 03/22/21 0919  BP: (!) 155/63 (!) 158/91 118/79 (!) 143/65  Pulse: 62 75 73 (!) 58  Resp: 17 18 (!) 22 16  Temp: (!) 97.4 F (36.3 C) 97.8 F (36.6 C) 98 F (36.7 C) 97.8 F (36.6 C)  TempSrc: Oral Oral Oral Oral  SpO2: 96% 95% 98% 94%  Weight:   52.9 kg   Height:  Intake/Output Summary (Last 24 hours) at 03/22/2021 1443 Last data filed at 03/22/2021 1302 Gross per 24 hour  Intake 483 ml  Output 1900 ml  Net -1417 ml    Filed Weights   03/20/21 0408 03/21/21 0546 03/22/21 0521  Weight: 52 kg 53 kg 52.9 kg    Examination:  General exam: NAD Respiratory system: CTA Cardiovascular system: S 1, S 2 RRR Gastrointestinal system: BS present, soft, nt Central nervous system: alert Extremities: no edema  Data Reviewed: I have personally reviewed following labs and imaging  studies  CBC: Recent Labs  Lab 03/16/21 1708 03/19/21 0327  WBC 8.7 7.4  NEUTROABS 6.3  --   HGB 12.1 11.7*  HCT 37.6 34.8*  MCV 93.3 90.4  PLT 243 [id number]    Basic Metabolic Panel: Recent Labs  Lab 03/16/21 1708 03/18/21 1755 03/19/21 0327 03/20/21 0311  NA 137 139 137 140  K 4.5 4.0 3.9 4.0  CL 106 107 109 110  CO2 20* 20* 17* 21*  GLUCOSE 135* 119* 118* 147*  BUN 41* 26* 28* 30*  CREATININE 2.74* 2.07* 1.93* 1.97*  CALCIUM 9.3 9.2 8.7* 8.9    GFR: Estimated Creatinine Clearance: 20.3 mL/min (A) (by C-G formula based on SCr of 1.97 mg/dL (H)). Liver Function Tests: Recent Labs  Lab 03/16/21 1708  AST 17  ALT 11  ALKPHOS 63  BILITOT 0.9  PROT 7.3  ALBUMIN 3.8    No results for input(s): LIPASE, AMYLASE in the last 168 hours. No results for input(s): AMMONIA in the last 168 hours. Coagulation Profile: No results for input(s): INR, PROTIME in the last 168 hours. Cardiac Enzymes: No results for input(s): CKTOTAL, CKMB, CKMBINDEX, TROPONINI in the last 168 hours. BNP (last 3 results) No results for input(s): PROBNP in the last 8760 hours. HbA1C: No results for input(s): HGBA1C in the last 72 hours. CBG: Recent Labs  Lab 03/17/21 0731  GLUCAP 136*    Lipid Profile: No results for input(s): CHOL, HDL, LDLCALC, TRIG, CHOLHDL, LDLDIRECT in the last 72 hours. Thyroid Function Tests: No results for input(s): TSH, T4TOTAL, FREET4, T3FREE, THYROIDAB in the last 72 hours. Anemia Panel: No results for input(s): VITAMINB12, FOLATE, FERRITIN, TIBC, IRON, RETICCTPCT in the last 72 hours. Sepsis Labs: No results for input(s): PROCALCITON, LATICACIDVEN in the last 168 hours.  Recent Results (from the past 240 hour(s))  Urine Culture     Status: Abnormal   Collection Time: 03/17/21  1:35 AM   Specimen: Urine, Clean Catch  Result Value Ref Range Status   Specimen Description URINE, CLEAN CATCH  Final   Special Requests   Final    NONE Performed at [facility], [address], [place], [place] [address]    Culture MULTIPLE SPECIES PRESENT, SUGGEST RECOLLECTION (A)  Final   Report Status 03/18/2021 FINAL  Final  Resp Panel by RT-PCR (Flu A&B, Covid) Nasopharyngeal Swab     Status: None   Collection Time: 03/17/21 11:00 AM   Specimen: Nasopharyngeal Swab; Nasopharyngeal(NP) swabs in vial transport medium  Result Value Ref Range Status   SARS Coronavirus 2 by RT PCR NEGATIVE NEGATIVE Final    Comment: (NOTE) SARS-CoV-2 target nucleic acids are NOT DETECTED.  The SARS-CoV-2 RNA is generally detectable in upper respiratory specimens during the acute phase of infection. The lowest concentration of SARS-CoV-2 viral copies this assay can detect is 138 copies/mL. A negative result does not preclude SARS-Cov-2 infection and should not be used as the sole basis  for treatment or other patient management decisions. A negative result may occur with  improper specimen collection/handling, submission of specimen other than nasopharyngeal swab, presence of viral mutation(s) within the areas targeted by this assay, and inadequate number of viral copies(<138 copies/mL). A negative result must be combined with clinical observations, patient history, and epidemiological information. The expected result is Negative.  Fact Sheet for Patients:  EntrepreneurPulse.com.au  Fact Sheet for Healthcare Providers:  IncredibleEmployment.be  This test is no t yet approved or cleared by the [place] FDA and  has been authorized for detection and/or diagnosis of SARS-CoV-2 by FDA under an Emergency Use Authorization (EUA). This EUA will remain  in effect (meaning this test can be used) for the duration of the COVID-19 declaration under Section 564(b)(1) of the Act, 21 U.S.C.section 360bbb-3(b)(1), unless the authorization is terminated  or revoked sooner.       Influenza A by PCR NEGATIVE NEGATIVE Final   Influenza B  by PCR NEGATIVE NEGATIVE Final    Comment: (NOTE) The Xpert Xpress SARS-CoV-2/FLU/RSV plus assay is intended as an aid in the diagnosis of influenza from Nasopharyngeal swab specimens and should not be used as a sole basis for treatment. Nasal washings and aspirates are unacceptable for Xpert Xpress SARS-CoV-2/FLU/RSV testing.  Fact Sheet for Patients: EntrepreneurPulse.com.au  Fact Sheet for Healthcare Providers: IncredibleEmployment.be  This test is not yet approved or cleared by the [place] FDA and has been authorized for detection and/or diagnosis of SARS-CoV-2 by FDA under an Emergency Use Authorization (EUA). This EUA will remain in effect (meaning this test can be used) for the duration of the COVID-19 declaration under Section 564(b)(1) of the Act, 21 U.S.C. section 360bbb-3(b)(1), unless the authorization is terminated or revoked.  Performed at [facility], [place] [address] [place], [place] [address]           Radiology Studies: No results found.      Scheduled Meds:  amLODipine  10 mg Oral Daily   aspirin EC  81 mg Oral Daily   diclofenac Sodium  2 g Topical BID   divalproex  125 mg Oral BID   escitalopram  5 mg Oral Daily   feeding supplement  237 mL Oral TID BM   fenofibrate  54 mg Oral Daily   heparin  5,000 Units Subcutaneous Q8H   melatonin  10 mg Oral QHS   QUEtiapine  25 mg Oral QHS   rosuvastatin  5 mg Oral Daily   senna-docusate  1 tablet Oral QHS   sodium bicarbonate  650 mg Oral TID   sodium chloride flush  3 mL Intravenous Q12H   Continuous Infusions:     LOS: 5 days    Time spent: 35 minutes.     Elmarie Shiley, MD [facility]   If 7PM-7AM, please contact night-coverage [email or web address]  03/22/2021, 2:43 PM

## 2021-03-22 NOTE — TOC Progression Note (Signed)
Transition of Care [facility]) - Progression Note    Patient Details  Name: Sandra Sheppard MRN: [id number] Date of Birth: 19-Dec-1945  Transition of Care [facility]) CM/SW Contact  Reece Agar, [place] Phone Number: 03/22/2021, 3:01 PM  Clinical Narrative:    CSW spoke with Helene Kelp at [facility] about pt DC, Helene Kelp stated that [name] was still pending and she will follow up with CSW tomorrow for updates.   Expected Discharge Plan: [place] Barriers to Discharge: Insurance Authorization  Expected Discharge Plan and Services Expected Discharge Plan: [place]       Living arrangements for the past 2 months: [place] Expected Discharge Date: 03/22/21                                     Social Determinants of Health (SDOH) Interventions    Readmission Risk Interventions No flowsheet data found.

## 2021-03-22 NOTE — Care Management Important Message (Signed)
Important Message  Patient Details  Name: Sandra Sheppard MRN: [id number] Date of Birth: 07/12/1945   Medicare Important Message Given:  Yes     [name] 03/22/2021, 10:39 AM

## 2021-03-23 DIAGNOSIS — R41841 Cognitive communication deficit: Secondary | ICD-10-CM | POA: Diagnosis not present

## 2021-03-23 DIAGNOSIS — R2689 Other abnormalities of gait and mobility: Secondary | ICD-10-CM | POA: Diagnosis not present

## 2021-03-23 DIAGNOSIS — N3000 Acute cystitis without hematuria: Secondary | ICD-10-CM | POA: Diagnosis not present

## 2021-03-23 DIAGNOSIS — S92911D Unspecified fracture of right toe(s), subsequent encounter for fracture with routine healing: Secondary | ICD-10-CM | POA: Diagnosis not present

## 2021-03-23 DIAGNOSIS — I10 Essential (primary) hypertension: Secondary | ICD-10-CM | POA: Diagnosis not present

## 2021-03-23 DIAGNOSIS — M6281 Muscle weakness (generalized): Secondary | ICD-10-CM | POA: Diagnosis not present

## 2021-03-23 DIAGNOSIS — I679 Cerebrovascular disease, unspecified: Secondary | ICD-10-CM | POA: Diagnosis not present

## 2021-03-23 DIAGNOSIS — R262 Difficulty in walking, not elsewhere classified: Secondary | ICD-10-CM | POA: Diagnosis not present

## 2021-03-23 DIAGNOSIS — W19XXXA Unspecified fall, initial encounter: Secondary | ICD-10-CM | POA: Diagnosis not present

## 2021-03-23 DIAGNOSIS — Z743 Need for continuous supervision: Secondary | ICD-10-CM | POA: Diagnosis not present

## 2021-03-23 LAB — BASIC METABOLIC PANEL
Anion gap: 9 (ref 5–15)
BUN: 30 mg/dL — ABNORMAL HIGH (ref 8–23)
CO2: 24 mmol/L (ref 22–32)
Calcium: 9.2 mg/dL (ref 8.9–10.3)
Chloride: 104 mmol/L (ref 98–111)
Creatinine, Ser: 2.2 mg/dL — ABNORMAL HIGH (ref 0.44–1.00)
GFR, Estimated: 23 mL/min — ABNORMAL LOW (ref >60)
Glucose, Bld: 165 mg/dL — ABNORMAL HIGH (ref 70–99)
Potassium: 4.5 mmol/L (ref 3.5–5.1)
Sodium: 137 mmol/L (ref 135–145)

## 2021-03-23 NOTE — TOC Transition Note (Signed)
Transition of Care [facility]) - CM/SW Discharge Note   Patient Details  Name: Sandra Sheppard MRN: [id number] Date of Birth: [birth year]  Transition of [facility]) CM/SW Contact:  Tresa Endo Phone Number: 03/23/2021, 11:02 AM   Clinical Narrative:    Patient will DC to: [facility] Anticipated DC date: 03/23/2021 Family notified: Pt sister Transport by: Corey Harold   Per MD patient ready for DC to [facility] room 118B. RN to call report prior to discharge [phone]). RN, patient, patient's family, and facility notified of DC. Discharge Summary and FL2 sent to facility. DC packet on chart. Ambulance transport requested for patient.   CSW will sign off for now as social work intervention is no longer needed. Please consult [place] again if new needs arise.       Barriers to Discharge: Insurance Authorization   Patient Goals and CMS Choice        Discharge Placement                       Discharge Plan and Services                                     Social Determinants of Health (SDOH) Interventions     Readmission Risk Interventions No flowsheet data found.

## 2021-03-23 NOTE — Consult Note (Signed)
   [facility] CM Inpatient Consult   03/23/2021  Sandra Sheppard 1946-01-27 [id number]  [place] Organization [ACO] Patient:  [name] Medicare  Primary Care Provider:  Prince Solian, MD, [facility]   Reviewed for extreme high risk score for post hospital readmission risk, however review reveals is transitioning to a skilled nursing facility level of care for transition of care [TOC].  Plan:  No current [facility] Care Management needs for TOC at this time.  For questions, please contact:  Natividad Brood, RN BSN [facility] Liaison  [phone] business mobile phone Toll free office [phone]  Fax number: [phone] [name]   .

## 2021-03-23 NOTE — Discharge Summary (Signed)
Physician Discharge Summary  Sandra Sheppard [id number] DOB: [birth year] DOA: 03/16/2021  PCP: Prince Solian, MD  Admit date: 03/16/2021 Discharge date: 03/23/2021  Admitted From: SNF Disposition: SNF  Recommendations for Outpatient Follow-up:  Follow up with PCP in 1-2 weeks Please obtain BMP/CBC in one week Consider palliative care consult for goals of care.  Monitor renal function, encourage oral intake.  Follow up with ortho for toe fracure   Discharge Condition: Stable.  CODE STATUS: Partial Code : No mechanical intubation.  Diet recommendation: Heart Healthy  Brief/Interim Summary: 75 year old with past medical history significant for paroxysmal A. fib, hypertension, CKD stage IV, dementia who presents after having an unwitnessed fall the day prior to admission.  Unclear if patient try to get herself out of the wheelchair or possible pass out.  Patient is not able to give significant history due to her dementia.  Patient has become more disoriented with increased hallucination and repetitive questioning.  Patient has been unable to feed herself lately.  Had a prior admission due to suspected syncopal episode due to orthostatic hypotension.   Evaluation in the ED CT head cervical spine did not show any acute abnormality.  Creatinine 2.4, chest x-ray low lung volume, pelvis x-ray did not show any acute fracture.  UA with large leukocytes greater than 50 white blood cell.   1-UTI: UA with more than 50 white blood cell. Completed IV ceftriaxone, for 3 days.  Urine culture; Multiples species present.    2-Recurrent fall: Monitor on telemetry.  PT/OT.   3-Acute Metabolic Encephalopathy: Patient was noted to be more altered with increased hallucination.  CT head no acute abnormality.  Suspect related to UTI Tramadol was discontinue.  Continue to be stable, pleasantly confuse Delirium precaution.   stable.   4-Dementia with behavioral disturbance and hallucination: Baseline  patient is only alert and oriented to self and sometimes hallucinate. Continue with Lexapro, Depakote and Seroquel.   5-AKI on CKD stage IV: Cr on  admission 2.7 BUN 41.  Baseline 2.2. Decrease IV fluids rate.  Improved. At baseline.  Per sister patient wouldn't want HD. Needs palliative care consult.    6-Right toe fractures: Ortho recommend Cam boot, PT weightbearing as tolerated   Hypertension: Continue with amlodipine Paroxysmal A. fib: Noted to be a good candidate for anticoagulation   History of CVA; continue with aspirin and statins     Estimated body mass index is 19.41 kg/m as calculated from the following:   Height as of this encounter: '5\' 5"'$  (1.651 m).   Weight as of this encounter: 52.9 kg.       Discharge Diagnoses:  Principal Problem:   UTI (urinary tract infection) Active Problems:   Paroxysmal atrial fibrillation (HCC)   Dyslipidemia   Acute kidney injury superimposed on CKD ([place])   Tobacco abuse   Fall   Closed fracture of right toe   Dementia with behavioral disturbance   Acute metabolic encephalopathy    Discharge Instructions  Discharge Instructions     Diet - low sodium heart healthy   Complete by: As directed    Diet - low sodium heart healthy   Complete by: As directed    Increase activity slowly   Complete by: As directed    Increase activity slowly   Complete by: As directed       Allergies as of 03/23/2021       Reactions   Ativan [lorazepam] Other (See Comments)   Confusion   Codeine Other (See Comments)  Rash, Nausea and Vomiting    Sulfonamide Derivatives Other (See Comments)   Unspecified   Penicillins Rash   Has patient had a PCN reaction causing immediate rash, facial/tongue/throat swelling, SOB or lightheadedness with hypotension: YES Has patient had a PCN reaction causing severe rash involving mucus membranes or skin necrosis:NO Has patient had a PCN reaction that required hospitalization NO Has patient had a PCN  reaction occurring within the last 10 years: NO If all of the above answers are "NO", then may proceed with Cephalosporin use.        Medication List     STOP taking these medications    feeding supplement Liqd   nicotine 21 mg/24hr patch Commonly known as: NICODERM CQ - dosed in mg/24 hours       TAKE these medications    acetaminophen 325 MG tablet Commonly known as: TYLENOL Take 650 mg by mouth every 4 (four) hours as needed for mild pain. Do not exceed 3,000 mg in 24 hrs   amLODipine 10 MG tablet Commonly known as: NORVASC Take 1 tablet (10 mg total) by mouth daily. What changed:  medication strength how much to take   aspirin 81 MG tablet Take 81 mg by mouth daily. 0900   Choline Fenofibrate 45 MG capsule Take 45 mg by mouth daily. 0900   cyanocobalamin 1000 MCG tablet Take 1 tablet (1,000 mcg total) by mouth daily. What changed: additional instructions   divalproex 125 MG DR tablet Commonly known as: DEPAKOTE Take 125 mg by mouth 2 (two) times daily. 0900, 1700   docusate sodium 100 MG capsule Commonly known as: COLACE Take 1 capsule (100 mg total) by mouth 2 (two) times daily. What changed: additional instructions   escitalopram 5 MG tablet Commonly known as: LEXAPRO Take 5 mg by mouth daily. 0900   loratadine 10 MG tablet Commonly known as: CLARITIN Take 10 mg by mouth daily as needed for allergies.   meclizine 12.5 MG tablet Commonly known as: ANTIVERT Take 12.5 mg by mouth 3 (three) times daily as needed for dizziness.   Melatonin 10 MG Tabs Take 10 mg by mouth at bedtime. 2100   multivitamin capsule Take 1 capsule by mouth daily. 0900   OVER THE COUNTER MEDICATION Take 237 mLs by mouth 3 (three) times daily between meals. House Supplement for nutritional support. 0900, 1400, 2100.   PreserVision AREDS 2+Multi Vit Caps Take 1 capsule by mouth daily at 6 (six) AM. 0900   QUEtiapine 25 MG tablet Commonly known as: SEROQUEL Take 1  tablet (25 mg total) by mouth at bedtime. What changed: additional instructions   rosuvastatin 5 MG tablet Commonly known as: CRESTOR Take 5 mg by mouth daily. 0900   senna-docusate 8.6-50 MG tablet Commonly known as: Senokot-S Take 1 tablet by mouth at bedtime. What changed: additional instructions   sodium bicarbonate 650 MG tablet Take 1 tablet (650 mg total) by mouth 3 (three) times daily.   Vitamin D-3 25 MCG (1000 UT) Caps Take 2,000 Units by mouth daily. 0900   zoledronic acid 5 MG/100ML Soln injection Commonly known as: RECLAST Inject 5 mg into the vein See admin instructions. Once a year        Allergies  Allergen Reactions   Ativan [Lorazepam] Other (See Comments)    Confusion   Codeine Other (See Comments)    Rash, Nausea and Vomiting    Sulfonamide Derivatives Other (See Comments)    Unspecified   Penicillins Rash    Has  patient had a PCN reaction causing immediate rash, facial/tongue/throat swelling, SOB or lightheadedness with hypotension: YES Has patient had a PCN reaction causing severe rash involving mucus membranes or skin necrosis:NO Has patient had a PCN reaction that required hospitalization NO Has patient had a PCN reaction occurring within the last 10 years: NO If all of the above answers are "NO", then may proceed with Cephalosporin use.    Consultations: None   Procedures/Studies: CT Head Wo Contrast  Result Date: 03/16/2021 CLINICAL DATA:  Head trauma EXAM: CT HEAD WITHOUT CONTRAST TECHNIQUE: Contiguous axial images were obtained from the base of the skull through the vertex without intravenous contrast. COMPARISON:  CT brain 11/06/2020 FINDINGS: Brain: No acute territorial infarction, hemorrhage or intracranial mass. Mild atrophy. Extensive chronic small-vessel ischemic changes of the white matter. Chronic lacunar infarcts in the right basal ganglia and adjacent white matter. Stable ventricle size. Vascular: No hyperdense vessels. Vertebral  and carotid vascular calcification Skull: Normal. Negative for fracture or focal lesion. Sinuses/Orbits: No acute finding. Other: None IMPRESSION: 1. No CT evidence for acute intracranial abnormality. 2. Atrophy and chronic small vessel ischemic changes of the white matter Electronically Signed   By: Donavan Foil M.D.   On: 03/16/2021 18:19   CT Cervical Spine Wo Contrast  Result Date: 03/17/2021 CLINICAL DATA:  75 year old female with history of trauma from a fall. EXAM: CT CERVICAL SPINE WITHOUT CONTRAST TECHNIQUE: Multidetector CT imaging of the cervical spine was performed without intravenous contrast. Multiplanar CT image reconstructions were also generated. COMPARISON:  Cervical spine CT 11/03/2020. FINDINGS: Alignment: Normal. Skull base and vertebrae: No acute fracture. No primary bone lesion or focal pathologic process. Soft tissues and spinal canal: No prevertebral fluid or swelling. No visible canal hematoma. Disc levels: Mild multilevel degenerative disc disease, most apparent at C4-C5. Mild multilevel facet arthropathy. Upper chest: Emphysema. Other: None. IMPRESSION: 1. No evidence of significant acute traumatic injury to the cervical spine. 2. Mild multilevel degenerative disc disease and cervical spondylosis, as above. 3. Emphysema. Electronically Signed   By: Vinnie Langton M.D.   On: 03/17/2021 08:41   DG Pelvis Portable  Result Date: 03/17/2021 CLINICAL DATA:  75 year old female with history of unwitnessed fall yesterday. EXAM: PORTABLE PELVIS 1-2 VIEWS COMPARISON:  No priors. FINDINGS: There is no evidence of pelvic fracture or diastasis. No pelvic bone lesions are seen. Mild joint space narrowing, subchondral sclerosis and osteophyte formation is noted in the hip joints bilaterally, indicative of osteoarthritis. Atherosclerotic calcifications in the pelvic vasculature. IMPRESSION: 1. No radiographic evidence of significant acute traumatic injury to the bony pelvis. Electronically  Signed   By: Vinnie Langton M.D.   On: 03/17/2021 07:40   DG Chest Portable 1 View  Result Date: 03/17/2021 CLINICAL DATA:  75 year old female with history of unwitnessed fall yesterday. EXAM: PORTABLE CHEST 1 VIEW COMPARISON:  Chest x-ray 08/13/2020. FINDINGS: Lung volumes are low. Mild diffuse interstitial prominence throughout the mid to lower lungs bilaterally, similar to the prior examination no consolidative airspace disease. No pleural effusions. No pneumothorax. No pulmonary nodule or mass noted. Pulmonary vasculature and the cardiomediastinal silhouette are within normal limits. Atherosclerotic calcifications in the thoracic aorta. 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 IMPRESSION: 1. Low lung volumes without radiographic evidence of acute cardiopulmonary disease. 2. Aortic atherosclerosis. Electronically Signed   By: Vinnie Langton M.D.   On: 03/17/2021 07:43   DG Foot 2 Views Right  Result Date: 03/17/2021 CLINICAL DATA:  Unwitnessed fall. EXAM: RIGHT FOOT - 2 VIEW COMPARISON:  None. FINDINGS: An acute fracture is seen involving the mid portion of the fifth right metatarsal. There is no evidence of dislocation. There is no evidence of arthropathy or other focal bone abnormality. Soft tissue swelling is seen adjacent to the previously noted fracture site. IMPRESSION: Acute fracture of the fifth right metatarsal. Electronically Signed   By: Virgina Norfolk M.D.   On: 03/17/2021 19:24     Subjective: No new complaints.   Discharge Exam: Vitals:   03/22/21 0919 03/23/21 0355  BP: (!) 143/65 138/67  Pulse: (!) 58 (!) 57  Resp: 16 16  Temp: 97.8 F (36.6 C) 97.9 F (36.6 C)  SpO2: 94% 95%     General: Pt is alert, awake, not in acute distress Cardiovascular: RRR, S1/S2 +, no rubs, no gallops Respiratory: CTA bilaterally, no wheezing, no rhonchi Abdominal: Soft, NT, ND, bowel sounds + Extremities: no edema,  no cyanosis    The results of significant diagnostics from this hospitalization (including imaging, microbiology, ancillary and laboratory) are listed below for reference.     Microbiology: Recent Results (from the past 240 hour(s))  Urine Culture     Status: Abnormal   Collection Time: 03/17/21  1:35 AM   Specimen: Urine, Clean Catch  Result Value Ref Range Status   Specimen Description URINE, CLEAN CATCH  Final   Special Requests   Final    NONE Performed at [facility], [place] [address], [place], [place] [address]    Culture MULTIPLE SPECIES PRESENT, SUGGEST RECOLLECTION (A)  Final   Report Status 03/18/2021 FINAL  Final  Resp Panel by RT-PCR (Flu A&B, Covid) Nasopharyngeal Swab     Status: None   Collection Time: 03/17/21 11:00 AM   Specimen: Nasopharyngeal Swab; Nasopharyngeal(NP) swabs in vial transport medium  Result Value Ref Range Status   SARS Coronavirus 2 by RT PCR NEGATIVE NEGATIVE Final    Comment: (NOTE) SARS-CoV-2 target nucleic acids are NOT DETECTED.  The SARS-CoV-2 RNA is generally detectable in upper respiratory specimens during the acute phase of infection. The lowest concentration of SARS-CoV-2 viral copies this assay can detect is 138 copies/mL. A negative result does not preclude SARS-Cov-2 infection and should not be used as the sole basis for treatment or other patient management decisions. A negative result may occur with  improper specimen collection/handling, submission of specimen other than nasopharyngeal swab, presence of viral mutation(s) within the areas targeted by this assay, and inadequate number of viral copies(<138 copies/mL). A negative result must be combined with clinical observations, patient history, and epidemiological information. The expected result is Negative.  Fact Sheet for Patients:  EntrepreneurPulse.com.au  Fact Sheet for Healthcare Providers:  IncredibleEmployment.be  This  test is no t yet approved or cleared by the [place] FDA and  has been authorized for detection and/or diagnosis of SARS-CoV-2 by FDA under an Emergency Use Authorization (EUA). This EUA will remain  in effect (meaning this test can be used) for the duration of the COVID-19 declaration under Section 564(b)(1) of the Act, 21 U.S.C.section 360bbb-3(b)(1), unless the authorization is terminated  or revoked sooner.       Influenza A by PCR NEGATIVE NEGATIVE Final   Influenza B by PCR NEGATIVE NEGATIVE Final    Comment: (NOTE) The Xpert Xpress SARS-CoV-2/FLU/RSV plus assay is intended as an aid in the diagnosis of influenza from Nasopharyngeal swab specimens and should not be used as a sole basis for treatment.  Nasal washings and aspirates are unacceptable for Xpert Xpress SARS-CoV-2/FLU/RSV testing.  Fact Sheet for Patients: EntrepreneurPulse.com.au  Fact Sheet for Healthcare Providers: IncredibleEmployment.be  This test is not yet approved or cleared by the [place] FDA and has been authorized for detection and/or diagnosis of SARS-CoV-2 by FDA under an Emergency Use Authorization (EUA). This EUA will remain in effect (meaning this test can be used) for the duration of the COVID-19 declaration under Section 564(b)(1) of the Act, 21 U.S.C. section 360bbb-3(b)(1), unless the authorization is terminated or revoked.  Performed at [facility], [place] [address] [place], [place] [address]   Resp Panel by RT-PCR (Flu A&B, Covid) Nasopharyngeal Swab     Status: None   Collection Time: 03/22/21  4:52 PM   Specimen: Nasopharyngeal Swab; Nasopharyngeal(NP) swabs in vial transport medium  Result Value Ref Range Status   SARS Coronavirus 2 by RT PCR NEGATIVE NEGATIVE Final    Comment: (NOTE) SARS-CoV-2 target nucleic acids are NOT DETECTED.  The SARS-CoV-2 RNA is generally detectable in upper respiratory specimens during the acute phase  of infection. The lowest concentration of SARS-CoV-2 viral copies this assay can detect is 138 copies/mL. A negative result does not preclude SARS-Cov-2 infection and should not be used as the sole basis for treatment or other patient management decisions. A negative result may occur with  improper specimen collection/handling, submission of specimen other than nasopharyngeal swab, presence of viral mutation(s) within the areas targeted by this assay, and inadequate number of viral copies(<138 copies/mL). A negative result must be combined with clinical observations, patient history, and epidemiological information. The expected result is Negative.  Fact Sheet for Patients:  EntrepreneurPulse.com.au  Fact Sheet for Healthcare Providers:  IncredibleEmployment.be  This test is no t yet approved or cleared by the [place] FDA and  has been authorized for detection and/or diagnosis of SARS-CoV-2 by FDA under an Emergency Use Authorization (EUA). This EUA will remain  in effect (meaning this test can be used) for the duration of the COVID-19 declaration under Section 564(b)(1) of the Act, 21 U.S.C.section 360bbb-3(b)(1), unless the authorization is terminated  or revoked sooner.       Influenza A by PCR NEGATIVE NEGATIVE Final   Influenza B by PCR NEGATIVE NEGATIVE Final    Comment: (NOTE) The Xpert Xpress SARS-CoV-2/FLU/RSV plus assay is intended as an aid in the diagnosis of influenza from Nasopharyngeal swab specimens and should not be used as a sole basis for treatment. Nasal washings and aspirates are unacceptable for Xpert Xpress SARS-CoV-2/FLU/RSV testing.  Fact Sheet for Patients: EntrepreneurPulse.com.au  Fact Sheet for Healthcare Providers: IncredibleEmployment.be  This test is not yet approved or cleared by the [place] FDA and has been authorized for detection and/or diagnosis of  SARS-CoV-2 by FDA under an Emergency Use Authorization (EUA). This EUA will remain in effect (meaning this test can be used) for the duration of the COVID-19 declaration under Section 564(b)(1) of the Act, 21 U.S.C. section 360bbb-3(b)(1), unless the authorization is terminated or revoked.  Performed at [facility], [place] [address], [place], [place] [address]      Labs: BNP (last 3 results) No results for input(s): BNP in the last 8760 hours. Basic Metabolic Panel: Recent Labs  Lab 03/18/21 1755 03/19/21 0327 03/20/21 0311 03/22/21 1534 03/23/21 0811  NA 139 137 140 136 137  K 4.0 3.9 4.0 4.5 4.5  CL 107 109 110 101 104  CO2 20* 17* 21* 27 24  GLUCOSE  119* 118* 147* 227* 165*  BUN 26* 28* 30* 27* 30*  CREATININE 2.07* 1.93* 1.97* 2.27* 2.20*  CALCIUM 9.2 8.7* 8.9 9.1 9.2   Liver Function Tests: Recent Labs  Lab 03/16/21 1708  AST 17  ALT 11  ALKPHOS 63  BILITOT 0.9  PROT 7.3  ALBUMIN 3.8   No results for input(s): LIPASE, AMYLASE in the last 168 hours. No results for input(s): AMMONIA in the last 168 hours. CBC: Recent Labs  Lab 03/16/21 1708 03/19/21 0327  WBC 8.7 7.4  NEUTROABS 6.3  --   HGB 12.1 11.7*  HCT 37.6 34.8*  MCV 93.3 90.4  PLT 243 234   Cardiac Enzymes: No results for input(s): CKTOTAL, CKMB, CKMBINDEX, TROPONINI in the last 168 hours. BNP: Invalid input(s): POCBNP CBG: Recent Labs  Lab 03/17/21 0731  GLUCAP 136*   D-Dimer No results for input(s): DDIMER in the last 72 hours. Hgb A1c No results for input(s): HGBA1C in the last 72 hours. Lipid Profile No results for input(s): CHOL, HDL, LDLCALC, TRIG, CHOLHDL, LDLDIRECT in the last 72 hours. Thyroid function studies No results for input(s): TSH, T4TOTAL, T3FREE, THYROIDAB in the last 72 hours.  Invalid input(s): FREET3 Anemia work up No results for input(s): VITAMINB12, FOLATE, FERRITIN, TIBC, IRON, RETICCTPCT in the last 72 hours. Urinalysis    Component Value  Date/Time   COLORURINE AMBER (A) 03/17/2021 0135   APPEARANCEUR CLOUDY (A) 03/17/2021 0135   LABSPEC 1.018 03/17/2021 0135   PHURINE 5.0 03/17/2021 0135   GLUCOSEU NEGATIVE 03/17/2021 0135   HGBUR NEGATIVE 03/17/2021 0135   BILIRUBINUR NEGATIVE 03/17/2021 0135   KETONESUR 5 (A) 03/17/2021 0135   PROTEINUR 30 (A) 03/17/2021 0135   UROBILINOGEN 0.2 01/14/2010 1308   NITRITE NEGATIVE 03/17/2021 0135   LEUKOCYTESUR LARGE (A) 03/17/2021 0135   Sepsis Labs Invalid input(s): PROCALCITONIN,  WBC,  LACTICIDVEN Microbiology Recent Results (from the past 240 hour(s))  Urine Culture     Status: Abnormal   Collection Time: 03/17/21  1:35 AM   Specimen: Urine, Clean Catch  Result Value Ref Range Status   Specimen Description URINE, CLEAN CATCH  Final   Special Requests   Final    NONE Performed at [facility], [place] [address] [place], [place] [address]    Culture MULTIPLE SPECIES PRESENT, SUGGEST RECOLLECTION (A)  Final   Report Status 03/18/2021 FINAL  Final  Resp Panel by RT-PCR (Flu A&B, Covid) Nasopharyngeal Swab     Status: None   Collection Time: 03/17/21 11:00 AM   Specimen: Nasopharyngeal Swab; Nasopharyngeal(NP) swabs in vial transport medium  Result Value Ref Range Status   SARS Coronavirus 2 by RT PCR NEGATIVE NEGATIVE Final    Comment: (NOTE) SARS-CoV-2 target nucleic acids are NOT DETECTED.  The SARS-CoV-2 RNA is generally detectable in upper respiratory specimens during the acute phase of infection. The lowest concentration of SARS-CoV-2 viral copies this assay can detect is 138 copies/mL. A negative result does not preclude SARS-Cov-2 infection and should not be used as the sole basis for treatment or other patient management decisions. A negative result may occur with  improper specimen collection/handling, submission of specimen other than nasopharyngeal swab, presence of viral mutation(s) within the areas targeted by this assay, and inadequate number of  viral copies(<138 copies/mL). A negative result must be combined with clinical observations, patient history, and epidemiological information. The expected result is Negative.  Fact Sheet for Patients:  EntrepreneurPulse.com.au  Fact Sheet for Healthcare Providers:  IncredibleEmployment.be  This  test is no t yet approved or cleared by the [place] and  has been authorized for detection and/or diagnosis of SARS-CoV-2 by FDA under an Emergency Use Authorization (EUA). This EUA will remain  in effect (meaning this test can be used) for the duration of the COVID-19 declaration under Section 564(b)(1) of the Act, 21 U.S.C.section 360bbb-3(b)(1), unless the authorization is terminated  or revoked sooner.       Influenza A by PCR NEGATIVE NEGATIVE Final   Influenza B by PCR NEGATIVE NEGATIVE Final    Comment: (NOTE) The Xpert Xpress SARS-CoV-2/FLU/RSV plus assay is intended as an aid in the diagnosis of influenza from Nasopharyngeal swab specimens and should not be used as a sole basis for treatment. Nasal washings and aspirates are unacceptable for Xpert Xpress SARS-CoV-2/FLU/RSV testing.  Fact Sheet for Patients: EntrepreneurPulse.com.au  Fact Sheet for Healthcare Providers: IncredibleEmployment.be  This test is not yet approved or cleared by the [place] FDA and has been authorized for detection and/or diagnosis of SARS-CoV-2 by FDA under an Emergency Use Authorization (EUA). This EUA will remain in effect (meaning this test can be used) for the duration of the COVID-19 declaration under Section 564(b)(1) of the Act, 21 U.S.C. section 360bbb-3(b)(1), unless the authorization is terminated or revoked.  Performed at [facility], [place] [address], [place], [place] [address]   Resp Panel by RT-PCR (Flu A&B, Covid) Nasopharyngeal Swab     Status: None   Collection Time: 03/22/21  4:52 PM    Specimen: Nasopharyngeal Swab; Nasopharyngeal(NP) swabs in vial transport medium  Result Value Ref Range Status   SARS Coronavirus 2 by RT PCR NEGATIVE NEGATIVE Final    Comment: (NOTE) SARS-CoV-2 target nucleic acids are NOT DETECTED.  The SARS-CoV-2 RNA is generally detectable in upper respiratory specimens during the acute phase of infection. The lowest concentration of SARS-CoV-2 viral copies this assay can detect is 138 copies/mL. A negative result does not preclude SARS-Cov-2 infection and should not be used as the sole basis for treatment or other patient management decisions. A negative result may occur with  improper specimen collection/handling, submission of specimen other than nasopharyngeal swab, presence of viral mutation(s) within the areas targeted by this assay, and inadequate number of viral copies(<138 copies/mL). A negative result must be combined with clinical observations, patient history, and epidemiological information. The expected result is Negative.  Fact Sheet for Patients:  EntrepreneurPulse.com.au  Fact Sheet for Healthcare Providers:  IncredibleEmployment.be  This test is no t yet approved or cleared by the [place] FDA and  has been authorized for detection and/or diagnosis of SARS-CoV-2 by FDA under an Emergency Use Authorization (EUA). This EUA will remain  in effect (meaning this test can be used) for the duration of the COVID-19 declaration under Section 564(b)(1) of the Act, 21 U.S.C.section 360bbb-3(b)(1), unless the authorization is terminated  or revoked sooner.       Influenza A by PCR NEGATIVE NEGATIVE Final   Influenza B by PCR NEGATIVE NEGATIVE Final    Comment: (NOTE) The Xpert Xpress SARS-CoV-2/FLU/RSV plus assay is intended as an aid in the diagnosis of influenza from Nasopharyngeal swab specimens and should not be used as a sole basis for treatment. Nasal washings and aspirates are  unacceptable for Xpert Xpress SARS-CoV-2/FLU/RSV testing.  Fact Sheet for Patients: EntrepreneurPulse.com.au  Fact Sheet for Healthcare Providers: IncredibleEmployment.be  This test is not yet approved or cleared by the [place] FDA and has been authorized for detection and/or  diagnosis of SARS-CoV-2 by FDA under an Emergency Use Authorization (EUA). This EUA will remain in effect (meaning this test can be used) for the duration of the COVID-19 declaration under Section 564(b)(1) of the Act, 21 U.S.C. section 360bbb-3(b)(1), unless the authorization is terminated or revoked.  Performed at [facility], [place] [address], [place],  [address]      Time coordinating discharge: 40 minutes  SIGNED:   Elmarie Shiley, MD  [facility]

## 2021-03-24 DIAGNOSIS — M6281 Muscle weakness (generalized): Secondary | ICD-10-CM | POA: Diagnosis not present

## 2021-03-24 DIAGNOSIS — R2689 Other abnormalities of gait and mobility: Secondary | ICD-10-CM | POA: Diagnosis not present

## 2021-03-24 DIAGNOSIS — S92911D Unspecified fracture of right toe(s), subsequent encounter for fracture with routine healing: Secondary | ICD-10-CM | POA: Diagnosis not present

## 2021-03-24 DIAGNOSIS — R262 Difficulty in walking, not elsewhere classified: Secondary | ICD-10-CM | POA: Diagnosis not present

## 2021-03-24 DIAGNOSIS — I679 Cerebrovascular disease, unspecified: Secondary | ICD-10-CM | POA: Diagnosis not present

## 2021-03-24 DIAGNOSIS — R41841 Cognitive communication deficit: Secondary | ICD-10-CM | POA: Diagnosis not present

## 2021-03-25 DIAGNOSIS — R41841 Cognitive communication deficit: Secondary | ICD-10-CM | POA: Diagnosis not present

## 2021-03-25 DIAGNOSIS — I679 Cerebrovascular disease, unspecified: Secondary | ICD-10-CM | POA: Diagnosis not present

## 2021-03-25 DIAGNOSIS — R262 Difficulty in walking, not elsewhere classified: Secondary | ICD-10-CM | POA: Diagnosis not present

## 2021-03-25 DIAGNOSIS — R2689 Other abnormalities of gait and mobility: Secondary | ICD-10-CM | POA: Diagnosis not present

## 2021-03-25 DIAGNOSIS — M6281 Muscle weakness (generalized): Secondary | ICD-10-CM | POA: Diagnosis not present

## 2021-03-25 DIAGNOSIS — S92911D Unspecified fracture of right toe(s), subsequent encounter for fracture with routine healing: Secondary | ICD-10-CM | POA: Diagnosis not present

## 2021-03-26 DIAGNOSIS — R41841 Cognitive communication deficit: Secondary | ICD-10-CM | POA: Diagnosis not present

## 2021-03-26 DIAGNOSIS — M6281 Muscle weakness (generalized): Secondary | ICD-10-CM | POA: Diagnosis not present

## 2021-03-26 DIAGNOSIS — R2689 Other abnormalities of gait and mobility: Secondary | ICD-10-CM | POA: Diagnosis not present

## 2021-03-26 DIAGNOSIS — S92911D Unspecified fracture of right toe(s), subsequent encounter for fracture with routine healing: Secondary | ICD-10-CM | POA: Diagnosis not present

## 2021-03-26 DIAGNOSIS — I679 Cerebrovascular disease, unspecified: Secondary | ICD-10-CM | POA: Diagnosis not present

## 2021-03-26 DIAGNOSIS — R262 Difficulty in walking, not elsewhere classified: Secondary | ICD-10-CM | POA: Diagnosis not present

## 2021-03-29 DIAGNOSIS — I679 Cerebrovascular disease, unspecified: Secondary | ICD-10-CM | POA: Diagnosis not present

## 2021-03-29 DIAGNOSIS — N184 Chronic kidney disease, stage 4 (severe): Secondary | ICD-10-CM | POA: Diagnosis not present

## 2021-03-29 DIAGNOSIS — N179 Acute kidney failure, unspecified: Secondary | ICD-10-CM | POA: Diagnosis not present

## 2021-03-29 DIAGNOSIS — S92911D Unspecified fracture of right toe(s), subsequent encounter for fracture with routine healing: Secondary | ICD-10-CM | POA: Diagnosis not present

## 2021-03-29 DIAGNOSIS — R41841 Cognitive communication deficit: Secondary | ICD-10-CM | POA: Diagnosis not present

## 2021-03-29 DIAGNOSIS — N390 Urinary tract infection, site not specified: Secondary | ICD-10-CM | POA: Diagnosis not present

## 2021-03-29 DIAGNOSIS — R296 Repeated falls: Secondary | ICD-10-CM | POA: Diagnosis not present

## 2021-03-29 DIAGNOSIS — R443 Hallucinations, unspecified: Secondary | ICD-10-CM | POA: Diagnosis not present

## 2021-03-29 DIAGNOSIS — R262 Difficulty in walking, not elsewhere classified: Secondary | ICD-10-CM | POA: Diagnosis not present

## 2021-03-29 DIAGNOSIS — R2689 Other abnormalities of gait and mobility: Secondary | ICD-10-CM | POA: Diagnosis not present

## 2021-03-29 DIAGNOSIS — R69 Illness, unspecified: Secondary | ICD-10-CM | POA: Diagnosis not present

## 2021-03-29 DIAGNOSIS — S92353A Displaced fracture of fifth metatarsal bone, unspecified foot, initial encounter for closed fracture: Secondary | ICD-10-CM | POA: Diagnosis not present

## 2021-03-29 DIAGNOSIS — M6281 Muscle weakness (generalized): Secondary | ICD-10-CM | POA: Diagnosis not present

## 2021-03-29 DIAGNOSIS — G9341 Metabolic encephalopathy: Secondary | ICD-10-CM | POA: Diagnosis not present

## 2021-03-30 DIAGNOSIS — I679 Cerebrovascular disease, unspecified: Secondary | ICD-10-CM | POA: Diagnosis not present

## 2021-03-30 DIAGNOSIS — S92911D Unspecified fracture of right toe(s), subsequent encounter for fracture with routine healing: Secondary | ICD-10-CM | POA: Diagnosis not present

## 2021-03-30 DIAGNOSIS — R2689 Other abnormalities of gait and mobility: Secondary | ICD-10-CM | POA: Diagnosis not present

## 2021-03-30 DIAGNOSIS — M6281 Muscle weakness (generalized): Secondary | ICD-10-CM | POA: Diagnosis not present

## 2021-03-30 DIAGNOSIS — R41841 Cognitive communication deficit: Secondary | ICD-10-CM | POA: Diagnosis not present

## 2021-03-30 DIAGNOSIS — R262 Difficulty in walking, not elsewhere classified: Secondary | ICD-10-CM | POA: Diagnosis not present

## 2021-03-31 DIAGNOSIS — R41841 Cognitive communication deficit: Secondary | ICD-10-CM | POA: Diagnosis not present

## 2021-03-31 DIAGNOSIS — R2689 Other abnormalities of gait and mobility: Secondary | ICD-10-CM | POA: Diagnosis not present

## 2021-03-31 DIAGNOSIS — M6281 Muscle weakness (generalized): Secondary | ICD-10-CM | POA: Diagnosis not present

## 2021-03-31 DIAGNOSIS — R262 Difficulty in walking, not elsewhere classified: Secondary | ICD-10-CM | POA: Diagnosis not present

## 2021-03-31 DIAGNOSIS — S92911D Unspecified fracture of right toe(s), subsequent encounter for fracture with routine healing: Secondary | ICD-10-CM | POA: Diagnosis not present

## 2021-03-31 DIAGNOSIS — I679 Cerebrovascular disease, unspecified: Secondary | ICD-10-CM | POA: Diagnosis not present

## 2021-04-01 DIAGNOSIS — R2689 Other abnormalities of gait and mobility: Secondary | ICD-10-CM | POA: Diagnosis not present

## 2021-04-01 DIAGNOSIS — S92911D Unspecified fracture of right toe(s), subsequent encounter for fracture with routine healing: Secondary | ICD-10-CM | POA: Diagnosis not present

## 2021-04-01 DIAGNOSIS — M6281 Muscle weakness (generalized): Secondary | ICD-10-CM | POA: Diagnosis not present

## 2021-04-01 DIAGNOSIS — R262 Difficulty in walking, not elsewhere classified: Secondary | ICD-10-CM | POA: Diagnosis not present

## 2021-04-01 DIAGNOSIS — I679 Cerebrovascular disease, unspecified: Secondary | ICD-10-CM | POA: Diagnosis not present

## 2021-04-01 DIAGNOSIS — R41841 Cognitive communication deficit: Secondary | ICD-10-CM | POA: Diagnosis not present

## 2021-04-02 DIAGNOSIS — I679 Cerebrovascular disease, unspecified: Secondary | ICD-10-CM | POA: Diagnosis not present

## 2021-04-02 DIAGNOSIS — N390 Urinary tract infection, site not specified: Secondary | ICD-10-CM | POA: Diagnosis not present

## 2021-04-02 DIAGNOSIS — R41841 Cognitive communication deficit: Secondary | ICD-10-CM | POA: Diagnosis not present

## 2021-04-02 DIAGNOSIS — G9341 Metabolic encephalopathy: Secondary | ICD-10-CM | POA: Diagnosis not present

## 2021-04-02 DIAGNOSIS — S92911D Unspecified fracture of right toe(s), subsequent encounter for fracture with routine healing: Secondary | ICD-10-CM | POA: Diagnosis not present

## 2021-04-02 DIAGNOSIS — R2689 Other abnormalities of gait and mobility: Secondary | ICD-10-CM | POA: Diagnosis not present

## 2021-04-02 DIAGNOSIS — R262 Difficulty in walking, not elsewhere classified: Secondary | ICD-10-CM | POA: Diagnosis not present

## 2021-04-02 DIAGNOSIS — M6281 Muscle weakness (generalized): Secondary | ICD-10-CM | POA: Diagnosis not present

## 2021-04-03 DIAGNOSIS — R262 Difficulty in walking, not elsewhere classified: Secondary | ICD-10-CM | POA: Diagnosis not present

## 2021-04-03 DIAGNOSIS — M6281 Muscle weakness (generalized): Secondary | ICD-10-CM | POA: Diagnosis not present

## 2021-04-03 DIAGNOSIS — R41841 Cognitive communication deficit: Secondary | ICD-10-CM | POA: Diagnosis not present

## 2021-04-03 DIAGNOSIS — S92911D Unspecified fracture of right toe(s), subsequent encounter for fracture with routine healing: Secondary | ICD-10-CM | POA: Diagnosis not present

## 2021-04-03 DIAGNOSIS — R2689 Other abnormalities of gait and mobility: Secondary | ICD-10-CM | POA: Diagnosis not present

## 2021-04-03 DIAGNOSIS — I679 Cerebrovascular disease, unspecified: Secondary | ICD-10-CM | POA: Diagnosis not present

## 2021-04-05 DIAGNOSIS — R2689 Other abnormalities of gait and mobility: Secondary | ICD-10-CM | POA: Diagnosis not present

## 2021-04-05 DIAGNOSIS — S92911D Unspecified fracture of right toe(s), subsequent encounter for fracture with routine healing: Secondary | ICD-10-CM | POA: Diagnosis not present

## 2021-04-05 DIAGNOSIS — M6281 Muscle weakness (generalized): Secondary | ICD-10-CM | POA: Diagnosis not present

## 2021-04-05 DIAGNOSIS — R41841 Cognitive communication deficit: Secondary | ICD-10-CM | POA: Diagnosis not present

## 2021-04-05 DIAGNOSIS — I679 Cerebrovascular disease, unspecified: Secondary | ICD-10-CM | POA: Diagnosis not present

## 2021-04-05 DIAGNOSIS — R262 Difficulty in walking, not elsewhere classified: Secondary | ICD-10-CM | POA: Diagnosis not present

## 2021-04-06 DIAGNOSIS — S92911D Unspecified fracture of right toe(s), subsequent encounter for fracture with routine healing: Secondary | ICD-10-CM | POA: Diagnosis not present

## 2021-04-06 DIAGNOSIS — I679 Cerebrovascular disease, unspecified: Secondary | ICD-10-CM | POA: Diagnosis not present

## 2021-04-06 DIAGNOSIS — R2689 Other abnormalities of gait and mobility: Secondary | ICD-10-CM | POA: Diagnosis not present

## 2021-04-06 DIAGNOSIS — R262 Difficulty in walking, not elsewhere classified: Secondary | ICD-10-CM | POA: Diagnosis not present

## 2021-04-06 DIAGNOSIS — R41841 Cognitive communication deficit: Secondary | ICD-10-CM | POA: Diagnosis not present

## 2021-04-06 DIAGNOSIS — M6281 Muscle weakness (generalized): Secondary | ICD-10-CM | POA: Diagnosis not present

## 2021-04-07 DIAGNOSIS — Z23 Encounter for immunization: Secondary | ICD-10-CM | POA: Diagnosis not present

## 2021-04-07 DIAGNOSIS — I679 Cerebrovascular disease, unspecified: Secondary | ICD-10-CM | POA: Diagnosis not present

## 2021-04-07 DIAGNOSIS — M6281 Muscle weakness (generalized): Secondary | ICD-10-CM | POA: Diagnosis not present

## 2021-04-07 DIAGNOSIS — R41841 Cognitive communication deficit: Secondary | ICD-10-CM | POA: Diagnosis not present

## 2021-04-07 DIAGNOSIS — R2689 Other abnormalities of gait and mobility: Secondary | ICD-10-CM | POA: Diagnosis not present

## 2021-04-07 DIAGNOSIS — R262 Difficulty in walking, not elsewhere classified: Secondary | ICD-10-CM | POA: Diagnosis not present

## 2021-04-07 DIAGNOSIS — S92911D Unspecified fracture of right toe(s), subsequent encounter for fracture with routine healing: Secondary | ICD-10-CM | POA: Diagnosis not present

## 2021-04-08 DIAGNOSIS — R41841 Cognitive communication deficit: Secondary | ICD-10-CM | POA: Diagnosis not present

## 2021-04-08 DIAGNOSIS — R262 Difficulty in walking, not elsewhere classified: Secondary | ICD-10-CM | POA: Diagnosis not present

## 2021-04-08 DIAGNOSIS — R2689 Other abnormalities of gait and mobility: Secondary | ICD-10-CM | POA: Diagnosis not present

## 2021-04-08 DIAGNOSIS — S92911D Unspecified fracture of right toe(s), subsequent encounter for fracture with routine healing: Secondary | ICD-10-CM | POA: Diagnosis not present

## 2021-04-08 DIAGNOSIS — I679 Cerebrovascular disease, unspecified: Secondary | ICD-10-CM | POA: Diagnosis not present

## 2021-04-08 DIAGNOSIS — M6281 Muscle weakness (generalized): Secondary | ICD-10-CM | POA: Diagnosis not present

## 2021-04-09 DIAGNOSIS — R2689 Other abnormalities of gait and mobility: Secondary | ICD-10-CM | POA: Diagnosis not present

## 2021-04-09 DIAGNOSIS — R41841 Cognitive communication deficit: Secondary | ICD-10-CM | POA: Diagnosis not present

## 2021-04-09 DIAGNOSIS — M6281 Muscle weakness (generalized): Secondary | ICD-10-CM | POA: Diagnosis not present

## 2021-04-09 DIAGNOSIS — S92911D Unspecified fracture of right toe(s), subsequent encounter for fracture with routine healing: Secondary | ICD-10-CM | POA: Diagnosis not present

## 2021-04-09 DIAGNOSIS — R262 Difficulty in walking, not elsewhere classified: Secondary | ICD-10-CM | POA: Diagnosis not present

## 2021-04-09 DIAGNOSIS — I679 Cerebrovascular disease, unspecified: Secondary | ICD-10-CM | POA: Diagnosis not present

## 2021-04-10 DIAGNOSIS — I679 Cerebrovascular disease, unspecified: Secondary | ICD-10-CM | POA: Diagnosis not present

## 2021-04-10 DIAGNOSIS — R262 Difficulty in walking, not elsewhere classified: Secondary | ICD-10-CM | POA: Diagnosis not present

## 2021-04-10 DIAGNOSIS — R41841 Cognitive communication deficit: Secondary | ICD-10-CM | POA: Diagnosis not present

## 2021-04-10 DIAGNOSIS — R2689 Other abnormalities of gait and mobility: Secondary | ICD-10-CM | POA: Diagnosis not present

## 2021-04-10 DIAGNOSIS — S92911D Unspecified fracture of right toe(s), subsequent encounter for fracture with routine healing: Secondary | ICD-10-CM | POA: Diagnosis not present

## 2021-04-10 DIAGNOSIS — M6281 Muscle weakness (generalized): Secondary | ICD-10-CM | POA: Diagnosis not present

## 2021-04-12 DIAGNOSIS — M6281 Muscle weakness (generalized): Secondary | ICD-10-CM | POA: Diagnosis not present

## 2021-04-12 DIAGNOSIS — S92911D Unspecified fracture of right toe(s), subsequent encounter for fracture with routine healing: Secondary | ICD-10-CM | POA: Diagnosis not present

## 2021-04-12 DIAGNOSIS — R2689 Other abnormalities of gait and mobility: Secondary | ICD-10-CM | POA: Diagnosis not present

## 2021-04-12 DIAGNOSIS — R41841 Cognitive communication deficit: Secondary | ICD-10-CM | POA: Diagnosis not present

## 2021-04-12 DIAGNOSIS — I679 Cerebrovascular disease, unspecified: Secondary | ICD-10-CM | POA: Diagnosis not present

## 2021-04-12 DIAGNOSIS — R262 Difficulty in walking, not elsewhere classified: Secondary | ICD-10-CM | POA: Diagnosis not present

## 2021-04-13 DIAGNOSIS — R262 Difficulty in walking, not elsewhere classified: Secondary | ICD-10-CM | POA: Diagnosis not present

## 2021-04-13 DIAGNOSIS — I679 Cerebrovascular disease, unspecified: Secondary | ICD-10-CM | POA: Diagnosis not present

## 2021-04-13 DIAGNOSIS — R2689 Other abnormalities of gait and mobility: Secondary | ICD-10-CM | POA: Diagnosis not present

## 2021-04-13 DIAGNOSIS — M6281 Muscle weakness (generalized): Secondary | ICD-10-CM | POA: Diagnosis not present

## 2021-04-13 DIAGNOSIS — S92911D Unspecified fracture of right toe(s), subsequent encounter for fracture with routine healing: Secondary | ICD-10-CM | POA: Diagnosis not present

## 2021-04-14 DIAGNOSIS — M6281 Muscle weakness (generalized): Secondary | ICD-10-CM | POA: Diagnosis not present

## 2021-04-14 DIAGNOSIS — R2689 Other abnormalities of gait and mobility: Secondary | ICD-10-CM | POA: Diagnosis not present

## 2021-04-14 DIAGNOSIS — R262 Difficulty in walking, not elsewhere classified: Secondary | ICD-10-CM | POA: Diagnosis not present

## 2021-04-14 DIAGNOSIS — S92911D Unspecified fracture of right toe(s), subsequent encounter for fracture with routine healing: Secondary | ICD-10-CM | POA: Diagnosis not present

## 2021-04-14 DIAGNOSIS — I679 Cerebrovascular disease, unspecified: Secondary | ICD-10-CM | POA: Diagnosis not present

## 2021-04-26 DIAGNOSIS — E119 Type 2 diabetes mellitus without complications: Secondary | ICD-10-CM | POA: Diagnosis not present

## 2021-04-26 DIAGNOSIS — I10 Essential (primary) hypertension: Secondary | ICD-10-CM | POA: Diagnosis not present

## 2021-04-26 DIAGNOSIS — R69 Illness, unspecified: Secondary | ICD-10-CM | POA: Diagnosis not present

## 2021-04-26 DIAGNOSIS — N184 Chronic kidney disease, stage 4 (severe): Secondary | ICD-10-CM | POA: Diagnosis not present

## 2021-04-28 DIAGNOSIS — N390 Urinary tract infection, site not specified: Secondary | ICD-10-CM | POA: Diagnosis not present

## 2021-04-28 DIAGNOSIS — I10 Essential (primary) hypertension: Secondary | ICD-10-CM | POA: Diagnosis not present

## 2021-04-29 DIAGNOSIS — I10 Essential (primary) hypertension: Secondary | ICD-10-CM | POA: Diagnosis not present

## 2021-04-29 DIAGNOSIS — N183 Chronic kidney disease, stage 3 unspecified: Secondary | ICD-10-CM | POA: Diagnosis not present

## 2021-04-29 DIAGNOSIS — E43 Unspecified severe protein-calorie malnutrition: Secondary | ICD-10-CM | POA: Diagnosis not present

## 2021-04-29 DIAGNOSIS — R4189 Other symptoms and signs involving cognitive functions and awareness: Secondary | ICD-10-CM | POA: Diagnosis not present

## 2021-04-29 DIAGNOSIS — N179 Acute kidney failure, unspecified: Secondary | ICD-10-CM | POA: Diagnosis not present

## 2021-04-29 DIAGNOSIS — Z515 Encounter for palliative care: Secondary | ICD-10-CM | POA: Diagnosis not present

## 2021-04-29 DIAGNOSIS — N390 Urinary tract infection, site not specified: Secondary | ICD-10-CM | POA: Diagnosis not present

## 2021-04-29 DIAGNOSIS — N184 Chronic kidney disease, stage 4 (severe): Secondary | ICD-10-CM | POA: Diagnosis not present

## 2021-05-03 ENCOUNTER — Emergency Department (HOSPITAL_COMMUNITY)

## 2021-05-03 ENCOUNTER — Other Ambulatory Visit: Payer: Self-pay

## 2021-05-03 ENCOUNTER — Emergency Department (HOSPITAL_COMMUNITY)
Admission: EM | Admit: 2021-05-03 | Discharge: 2021-05-04 | Disposition: A | Discharge status: Skilled Nursing Facility | Attending: Emergency Medicine | Admitting: Emergency Medicine

## 2021-05-03 DIAGNOSIS — M47812 Spondylosis without myelopathy or radiculopathy, cervical region: Secondary | ICD-10-CM | POA: Diagnosis not present

## 2021-05-03 DIAGNOSIS — I129 Hypertensive chronic kidney disease with stage 1 through stage 4 chronic kidney disease, or unspecified chronic kidney disease: Secondary | ICD-10-CM | POA: Insufficient documentation

## 2021-05-03 DIAGNOSIS — Z743 Need for continuous supervision: Secondary | ICD-10-CM | POA: Diagnosis not present

## 2021-05-03 DIAGNOSIS — S0990XA Unspecified injury of head, initial encounter: Secondary | ICD-10-CM | POA: Diagnosis not present

## 2021-05-03 DIAGNOSIS — E1122 Type 2 diabetes mellitus with diabetic chronic kidney disease: Secondary | ICD-10-CM | POA: Diagnosis not present

## 2021-05-03 DIAGNOSIS — R110 Nausea: Secondary | ICD-10-CM | POA: Diagnosis not present

## 2021-05-03 DIAGNOSIS — R69 Illness, unspecified: Secondary | ICD-10-CM | POA: Diagnosis not present

## 2021-05-03 DIAGNOSIS — Z87891 Personal history of nicotine dependence: Secondary | ICD-10-CM | POA: Insufficient documentation

## 2021-05-03 DIAGNOSIS — Z79899 Other long term (current) drug therapy: Secondary | ICD-10-CM | POA: Insufficient documentation

## 2021-05-03 DIAGNOSIS — R4189 Other symptoms and signs involving cognitive functions and awareness: Secondary | ICD-10-CM | POA: Diagnosis not present

## 2021-05-03 DIAGNOSIS — R519 Headache, unspecified: Secondary | ICD-10-CM | POA: Insufficient documentation

## 2021-05-03 DIAGNOSIS — S199XXA Unspecified injury of neck, initial encounter: Secondary | ICD-10-CM | POA: Diagnosis not present

## 2021-05-03 DIAGNOSIS — W19XXXA Unspecified fall, initial encounter: Secondary | ICD-10-CM

## 2021-05-03 DIAGNOSIS — I4891 Unspecified atrial fibrillation: Secondary | ICD-10-CM | POA: Diagnosis not present

## 2021-05-03 DIAGNOSIS — N184 Chronic kidney disease, stage 4 (severe): Secondary | ICD-10-CM | POA: Diagnosis not present

## 2021-05-03 DIAGNOSIS — N390 Urinary tract infection, site not specified: Secondary | ICD-10-CM | POA: Diagnosis not present

## 2021-05-03 DIAGNOSIS — I959 Hypotension, unspecified: Secondary | ICD-10-CM | POA: Diagnosis not present

## 2021-05-03 DIAGNOSIS — R739 Hyperglycemia, unspecified: Secondary | ICD-10-CM | POA: Diagnosis not present

## 2021-05-03 DIAGNOSIS — J439 Emphysema, unspecified: Secondary | ICD-10-CM | POA: Diagnosis not present

## 2021-05-03 IMAGING — CT CT CERVICAL SPINE W/O CM
3 series · 13 of 33 positions shown, 16 images · non-contrast
Comparison: CT dated [DATE].

CLINICAL DATA: Neck trauma.

EXAM:
CT HEAD WITHOUT CONTRAST
CT CERVICAL SPINE WITHOUT CONTRAST
TECHNIQUE: Multidetector CT imaging of the head and cervical spine was
performed following the standard protocol without intravenous
contrast. Multiplanar CT image reconstructions of the cervical spine
were also generated.

[Series 5: c_spine 2.0 st · axial · 0.39mm/px · z∈[+556,+690]mm · 5 of 97 slices shown, 7 images]
[im 15/97  soft-tissue]
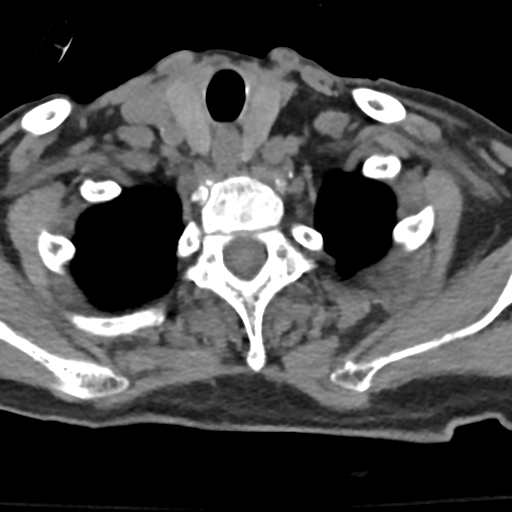
[im 15/97  bone]
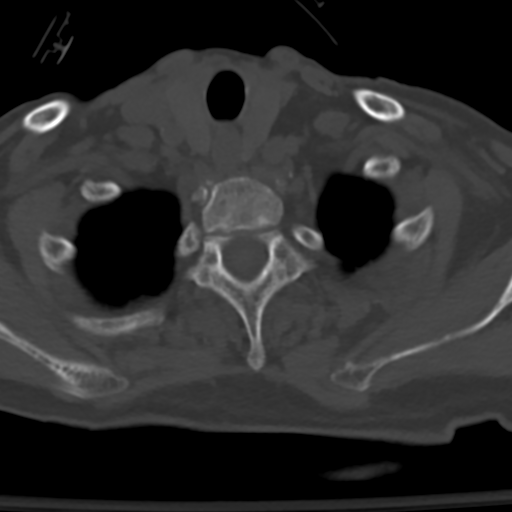
[im 30/97  bone]
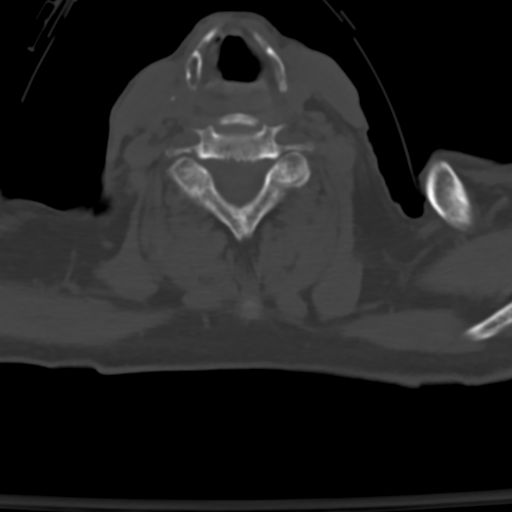
[im 52/97  bone]
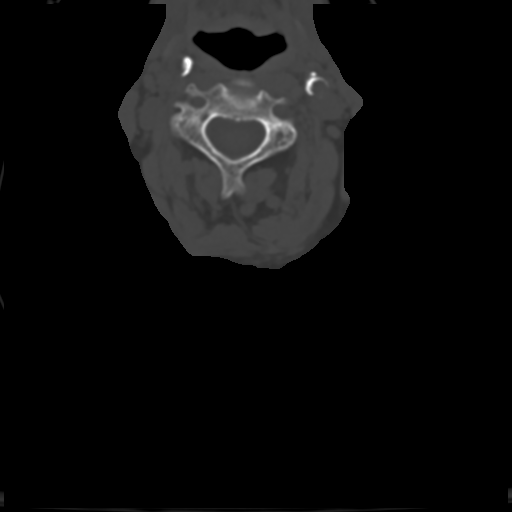
[im 67/97  bone]
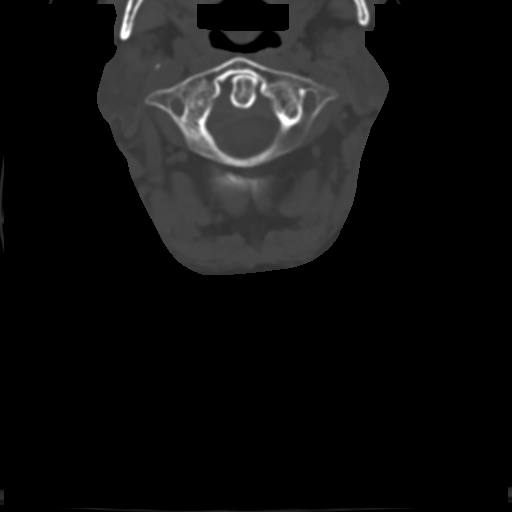
[im 82/97  soft-tissue]
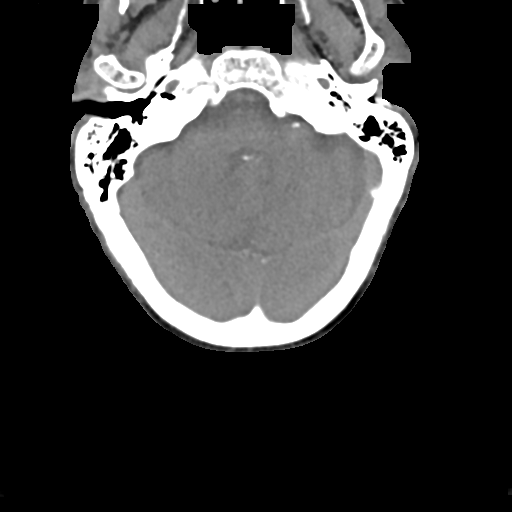
[im 82/97  bone]
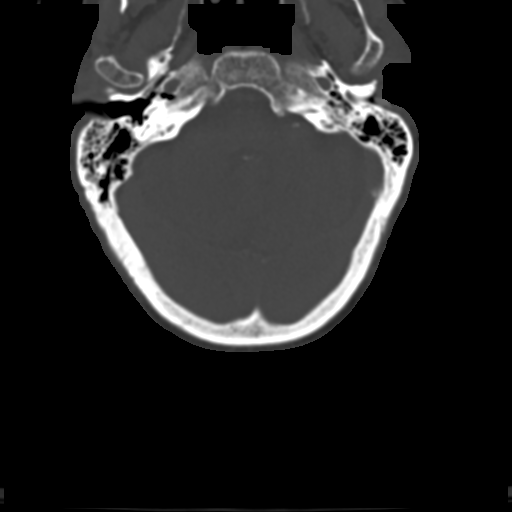

[Series 6: coronal bone · coronal · 0.23mm/px · 3 of 61 slices shown]
[im 13/61  bone]
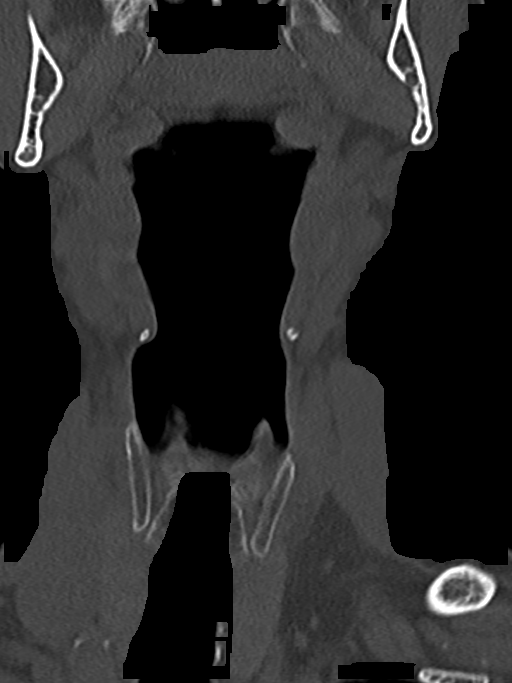
[im 25/61  bone]
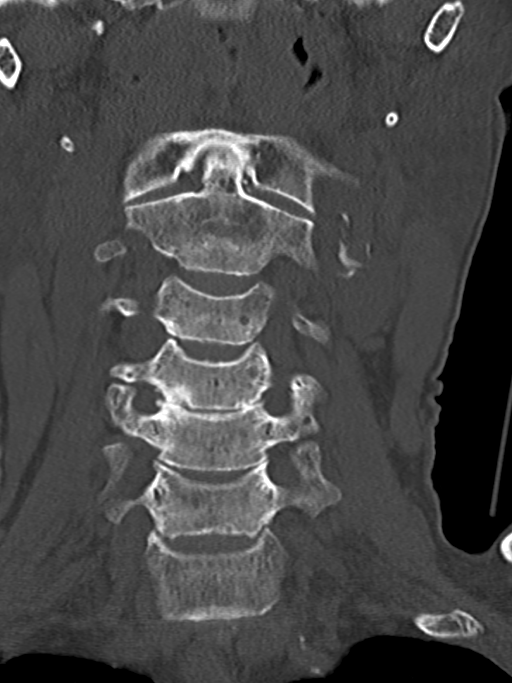
[im 37/61  bone]
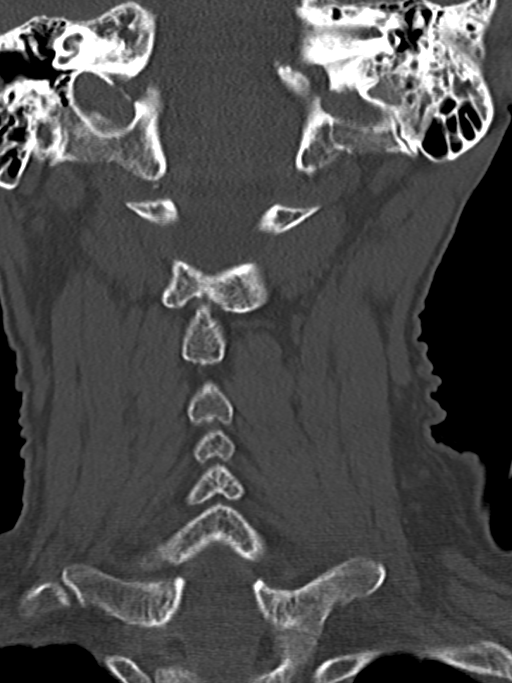

[Series 7: sagittal bone · sagittal · 0.23mm/px · 5 of 61 slices shown, 6 images]
[im 21/61  bone]
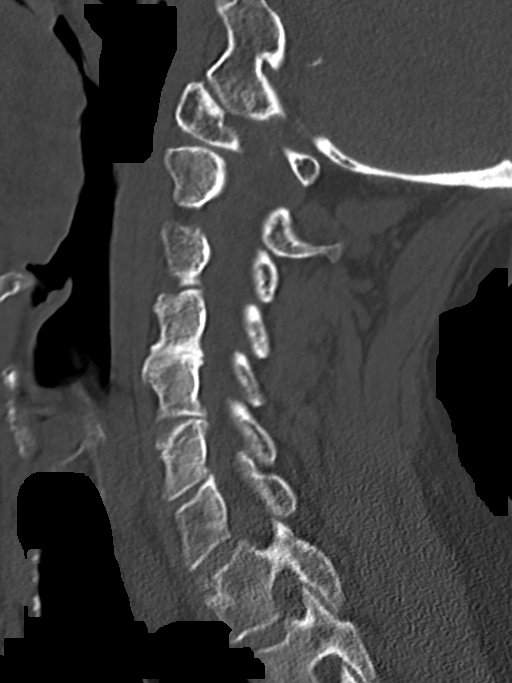
[im 26/61  bone]
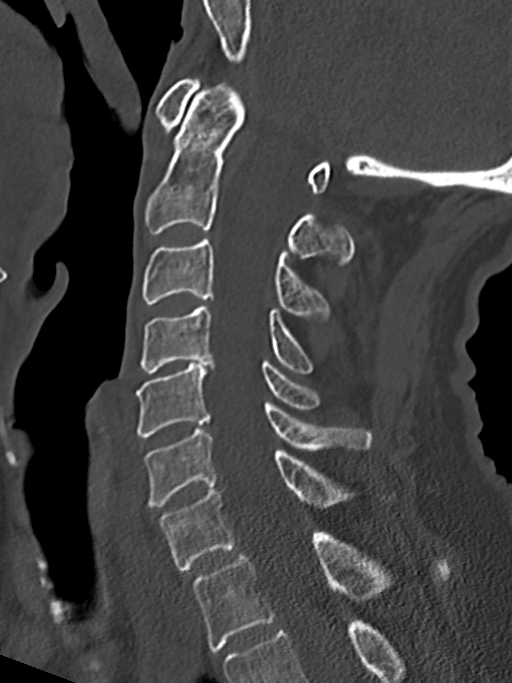
[im 31/61  soft-tissue]
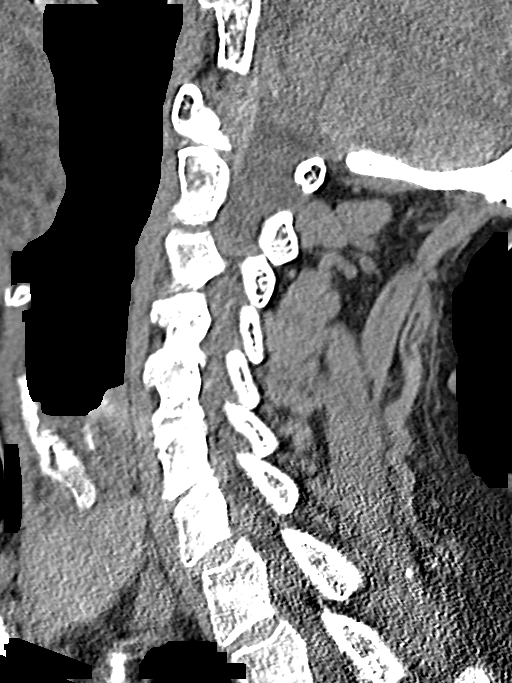
[im 31/61  bone]
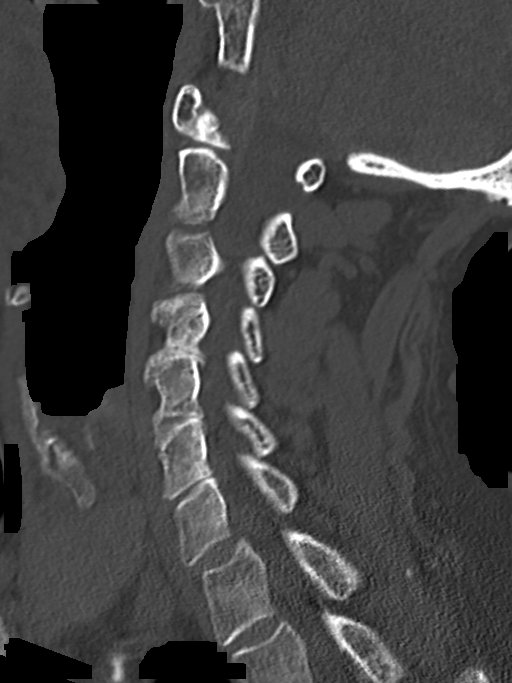
[im 36/61  bone]
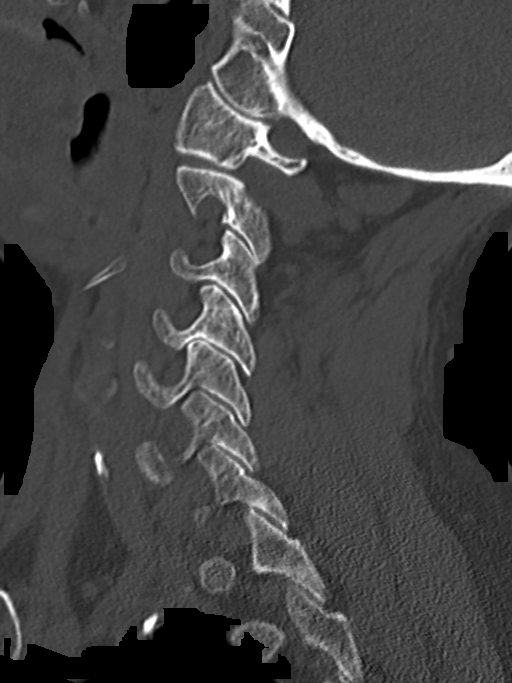
[im 41/61  bone]
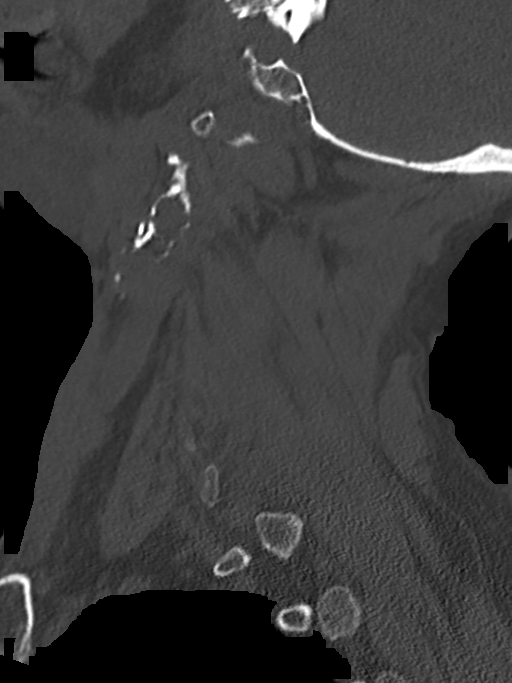

[13 of 33 positions shown; findings below may reference images not displayed]

FINDINGS: CT HEAD FINDINGS

Brain: Moderate age-related atrophy and chronic microvascular
ischemic changes. There is no acute intracranial hemorrhage. No mass
effect or midline shift. No extra-axial fluid collection.

Vascular: No hyperdense vessel or unexpected calcification.

Skull: Normal. Negative for fracture or focal lesion.

Sinuses/Orbits: No acute finding.

Other: None

CT CERVICAL SPINE FINDINGS

Alignment: No acute subluxation.

Skull base and vertebrae: No acute fracture.

Soft tissues and spinal canal: No prevertebral fluid or swelling. No
visible canal hematoma.

Disc levels:  Degenerative changes primarily at C4-C5 and C5-C6.

Upper chest: Emphysema.

Other: Bilateral carotid bulb calcified plaques.
IMPRESSION: 1. No acute intracranial pathology. Moderate age-related atrophy and
chronic microvascular ischemic changes.
2. No acute/traumatic cervical spine pathology.

## 2021-05-03 MED ORDER — ONDANSETRON 4 MG PO TBDP: 4.0000 mg | ORAL_TABLET | Freq: Once | ORAL | Status: DC

## 2021-05-03 NOTE — ED Notes (Signed)
[facility] called asking about Sandra Sheppard, also to be contacted if PT is admitted. There is [phone].

## 2021-05-03 NOTE — ED Triage Notes (Signed)
Patient BIB EMS for evaluation after a fall.  Patient from [place].  Had unwitnessed fall from wheelchair.  Patient with no current complaints.  Baseline mental status per EMS.

## 2021-05-03 NOTE — ED Provider Notes (Signed)
[facility] EMERGENCY DEPARTMENT Provider Note   CSN: [id number] Arrival date & time: 05/03/21  1906     History Chief Complaint  Patient presents with   Sandra Sheppard is a 75 y.o. female.  The history is provided by the patient, medical records and a relative. No language interpreter was used.  Fall This is a recurrent problem. The current episode started 3 to 5 hours ago. The problem occurs rarely. The problem has been resolved. Associated symptoms include headaches. Pertinent negatives include no chest pain, no abdominal pain and no shortness of breath. Nothing aggravates the symptoms. Nothing relieves the symptoms. She has tried nothing for the symptoms. The treatment provided no relief.      Past Medical History:  Diagnosis Date   ABDOMINAL AORTIC ANEURYSM    Atrial fibrillation ([place])    CAROTID STENOSIS    CKD (chronic kidney disease), stage V ([place])    Complication of anesthesia    Confusion    CVD (cardiovascular disease)    Diabetes ([place])    no meds at present time   FIBRILLATION, ATRIAL    HYPERCHOLESTEROLEMIA  IIA    HYPERTENSION, MILD    MURMUR    Osteoporosis    PERIPHERAL VASCULAR DISEASE    PONV (postoperative nausea and vomiting)    STENOSIS, MITRAL AND AORTIC VALVES     Patient Active Problem List   Diagnosis Date Noted   UTI (urinary tract infection) 03/17/2021   Fall 03/17/2021   Closed fracture of right toe 03/17/2021   Dementia with behavioral disturbance 84/16/6063   Acute metabolic encephalopathy 01/60/1093   Syncope and collapse 08/17/2020   Protein-calorie malnutrition, severe 08/17/2020   Near syncope 08/14/2020   CKD (chronic kidney disease) stage 4, GFR 15-29 ml/min ([place]) 08/13/2020   Tobacco abuse 09/22/2019   Educated about COVID-19 virus infection 09/22/2019   Asymptomatic bilateral carotid artery stenosis 09/22/2019   Acute kidney injury superimposed on CKD [facility])    Palliative care encounter     Hypocalcemia    FTT (failure to thrive) in adult    AKI (acute kidney injury) ([place]) 08/08/2018   Hyperkalemia 23/55/7322   Metabolic acidosis, increased anion gap 08/08/2018   Normocytic anemia 08/08/2018   Diabetes mellitus type II, non insulin dependent ([place]) 08/08/2018   Bilateral carotid artery disease ([place]) 07/12/2018   Dyslipidemia 07/12/2018   Vomiting 07/04/2016   Gastroenteritis 07/04/2016   CKD (chronic kidney disease) stage 3, GFR 30-59 ml/min ([place]) 07/04/2016   Occlusion and stenosis of carotid artery 04/28/2010   ABDOMINAL AORTIC ANEURYSM 04/28/2010   Peripheral vascular disease ([place]) 12/08/2008   STENOSIS, MITRAL AND AORTIC VALVES 10/07/2008   Paroxysmal atrial fibrillation ([place]) 10/07/2008   H/O aortic valve replacement 08/14/2008   HYPERCHOLESTEROLEMIA  IIA 06/23/2008   HYPERTENSION, MILD 06/23/2008   MURMUR 06/23/2008    Past Surgical History:  Procedure Laterality Date   AORTIC VALVE REPLACEMENT     08/14/2008.  Dr. Roxy Manns   MYRINGOTOMY WITH TUBE PLACEMENT Bilateral 03/01/2019   Procedure: MYRINGOTOMY WITH  T TUBE PLACEMENT;  Surgeon: Leta Baptist, MD;  Location: [facility];  Service: ENT;  Laterality: Bilateral;   TOTAL ABDOMINAL HYSTERECTOMY       OB History   No obstetric history on file.     Family History  Problem Relation Age of Onset   Colon polyps Other    Heart disease Mother    Heart disease Father    Heart  disease Brother     Social History   Tobacco Use   Smoking status: Former    Packs/day: 0.25    Types: Cigarettes   Smokeless tobacco: Never  Substance Use Topics   Alcohol use: No   Drug use: Never    Home Medications Prior to Admission medications   Medication Sig Start Date End Date Taking? Authorizing Provider  acetaminophen (TYLENOL) 325 MG tablet Take 650 mg by mouth every 4 (four) hours as needed for mild pain. Do not exceed 3,000 mg in 24 hrs    [provider]  amLODipine (NORVASC) 10 MG tablet Take 1  tablet (10 mg total) by mouth daily. 03/23/21   Regalado, Belkys A, MD  aspirin 81 MG tablet Take 81 mg by mouth daily. 0900    [provider]  Cholecalciferol (VITAMIN D-3) 1000 UNITS CAPS Take 2,000 Units by mouth daily. 0900    [provider]  Choline Fenofibrate 45 MG capsule Take 45 mg by mouth daily. 0900    [provider]  divalproex (DEPAKOTE) 125 MG DR tablet Take 125 mg by mouth 2 (two) times daily. 0900, 1700    [provider]  docusate sodium (COLACE) 100 MG capsule Take 1 capsule (100 mg total) by mouth 2 (two) times daily. Patient taking differently: Take 100 mg by mouth 2 (two) times daily. 0900, 1800 08/31/18   Hongalgi, Everlene Farrier D, MD  escitalopram (LEXAPRO) 5 MG tablet Take 5 mg by mouth daily. 0900 08/07/20   [provider]  loratadine (CLARITIN) 10 MG tablet Take 10 mg by mouth daily as needed for allergies.    [provider]  meclizine (ANTIVERT) 12.5 MG tablet Take 12.5 mg by mouth 3 (three) times daily as needed for dizziness.    [provider]  Melatonin 10 MG TABS Take 10 mg by mouth at bedtime. 2100    [provider]  Multiple Vitamin (MULTIVITAMIN) capsule Take 1 capsule by mouth daily. 0900    [provider]  Multiple Vitamins-Minerals (PRESERVISION AREDS 2+MULTI VIT) CAPS Take 1 capsule by mouth daily at 6 (six) AM. 0900    [provider]  OVER THE COUNTER MEDICATION Take 237 mLs by mouth 3 (three) times daily between meals. House Supplement for nutritional support. 0900, 1400, 2100.    [provider]  QUEtiapine (SEROQUEL) 25 MG tablet Take 1 tablet (25 mg total) by mouth at bedtime. Patient taking differently: Take 25 mg by mouth at bedtime. 2100 09/01/20   Manuella Ghazi, Pratik D, DO  rosuvastatin (CRESTOR) 5 MG tablet Take 5 mg by mouth daily. 0900    [provider]  senna-docusate (SENOKOT-S) 8.6-50 MG tablet Take 1 tablet by mouth at bedtime. Patient taking  differently: Take 1 tablet by mouth at bedtime. 2100 09/01/20   Manuella Ghazi, Pratik D, DO  sodium bicarbonate 650 MG tablet Take 1 tablet (650 mg total) by mouth 3 (three) times daily. 03/22/21   Regalado, Belkys A, MD  vitamin B-12 1000 MCG tablet Take 1 tablet (1,000 mcg total) by mouth daily. Patient taking differently: Take 1,000 mcg by mouth daily. 0900 08/31/18   Hongalgi, Lenis Dickinson, MD  zoledronic acid (RECLAST) 5 MG/100ML SOLN injection Inject 5 mg into the vein See admin instructions. Once a year    [provider]    Allergies    Ativan [lorazepam], Codeine, Sulfonamide derivatives, and Penicillins  Review of Systems   Review of Systems  Constitutional:  Negative for chills, fatigue  and fever.  HENT:  Negative for congestion.   Eyes:  Negative for visual disturbance.  Respiratory:  Negative for cough, chest tightness and shortness of breath.   Cardiovascular:  Negative for chest pain.  Gastrointestinal:  Negative for abdominal pain, constipation, diarrhea, nausea and vomiting.  Genitourinary:  Negative for dysuria and flank pain.  Musculoskeletal:  Negative for back pain and neck pain.  Skin:  Negative for rash and wound.  Neurological:  Positive for headaches. Negative for dizziness, weakness, light-headedness and numbness.  Psychiatric/Behavioral:  Negative for agitation.   All other systems reviewed and are negative.  Physical Exam Updated Vital Signs BP (!) 153/62   Pulse 73   Temp 97.9 F (36.6 C)   Resp 20   SpO2 98%   Physical Exam Vitals and nursing note reviewed.  Constitutional:      General: She is not in acute distress.    Appearance: Normal appearance. She is well-developed. She is not ill-appearing, toxic-appearing or diaphoretic.  HENT:     Head: Normocephalic and atraumatic.     Nose: Nose normal.     Mouth/Throat:     Mouth: Mucous membranes are moist.     Pharynx: No oropharyngeal exudate or posterior oropharyngeal erythema.  Eyes:      Conjunctiva/sclera: Conjunctivae normal.     Pupils: Pupils are equal, round, and reactive to light.  Neck:     Comments: In C collar Cardiovascular:     Rate and Rhythm: Normal rate and regular rhythm.     Heart sounds: No murmur heard. Pulmonary:     Effort: Pulmonary effort is normal. No respiratory distress.     Breath sounds: Normal breath sounds. No wheezing, rhonchi or rales.  Chest:     Chest wall: No tenderness.  Abdominal:     Palpations: Abdomen is soft.     Tenderness: There is no abdominal tenderness. There is no guarding or rebound.  Musculoskeletal:        General: No swelling or tenderness.     Cervical back: Neck supple. No tenderness.  Skin:    General: Skin is warm and dry.     Capillary Refill: Capillary refill takes less than 2 seconds.  Neurological:     General: No focal deficit present.     Mental Status: She is alert. Mental status is at baseline.     Sensory: No sensory deficit.     Motor: No weakness.  Psychiatric:        Mood and Affect: Mood normal.    ED Results / Procedures / Treatments   Labs (all labs ordered are listed, but only abnormal results are displayed) Labs Reviewed - No data to display  EKG None  Radiology CT HEAD WO CONTRAST (5MM)  Result Date: 05/03/2021 CLINICAL DATA:  Neck trauma. EXAM: CT HEAD WITHOUT CONTRAST CT CERVICAL SPINE WITHOUT CONTRAST TECHNIQUE: Multidetector CT imaging of the head and cervical spine was performed following the standard protocol without intravenous contrast. Multiplanar CT image reconstructions of the cervical spine were also generated. COMPARISON:  CT dated 03/17/2021. FINDINGS: CT HEAD FINDINGS Brain: Moderate age-related atrophy and chronic microvascular ischemic changes. There is no acute intracranial hemorrhage. No mass effect or midline shift. No extra-axial fluid collection. Vascular: No hyperdense vessel or unexpected calcification. Skull: Normal. Negative for fracture or focal lesion.  Sinuses/Orbits: No acute finding. Other: None CT CERVICAL SPINE FINDINGS Alignment: No acute subluxation. Skull base and vertebrae: No acute fracture. Soft tissues and spinal canal: No prevertebral  fluid or swelling. No visible canal hematoma. Disc levels:  Degenerative changes primarily at C4-C5 and C5-C6. Upper chest: Emphysema. Other: Bilateral carotid bulb calcified plaques. IMPRESSION: 1. No acute intracranial pathology. Moderate age-related atrophy and chronic microvascular ischemic changes. 2. No acute/traumatic cervical spine pathology. Electronically Signed   By: Anner Crete M.D.   On: 05/03/2021 21:44   CT Cervical Spine Wo Contrast  Result Date: 05/03/2021 CLINICAL DATA:  Neck trauma. EXAM: CT HEAD WITHOUT CONTRAST CT CERVICAL SPINE WITHOUT CONTRAST TECHNIQUE: Multidetector CT imaging of the head and cervical spine was performed following the standard protocol without intravenous contrast. Multiplanar CT image reconstructions of the cervical spine were also generated. COMPARISON:  CT dated 03/17/2021. FINDINGS: CT HEAD FINDINGS Brain: Moderate age-related atrophy and chronic microvascular ischemic changes. There is no acute intracranial hemorrhage. No mass effect or midline shift. No extra-axial fluid collection. Vascular: No hyperdense vessel or unexpected calcification. Skull: Normal. Negative for fracture or focal lesion. Sinuses/Orbits: No acute finding. Other: None CT CERVICAL SPINE FINDINGS Alignment: No acute subluxation. Skull base and vertebrae: No acute fracture. Soft tissues and spinal canal: No prevertebral fluid or swelling. No visible canal hematoma. Disc levels:  Degenerative changes primarily at C4-C5 and C5-C6. Upper chest: Emphysema. Other: Bilateral carotid bulb calcified plaques. IMPRESSION: 1. No acute intracranial pathology. Moderate age-related atrophy and chronic microvascular ischemic changes. 2. No acute/traumatic cervical spine pathology. Electronically Signed   By:  Anner Crete M.D.   On: 05/03/2021 21:44    Procedures Procedures   Medications Ordered in ED Medications  ondansetron (ZOFRAN-ODT) disintegrating tablet 4 mg (has no administration in time range)    ED Course  I have reviewed the triage vital signs and the nursing notes.  Pertinent labs & imaging results that were available during my care of the patient were reviewed by me and considered in my medical decision making (see chart for details).    MDM Rules/Calculators/A&P                           Sandra Sheppard is a 75 y.o. female with a past medical history significant for CKD, hypertension, hyperlipidemia, dementia, previous falls, aortic aneurysm, vascular disease, and AAA who presents with fall.  According to family who is at the bedside, patient has not supposed to walk but got out of her wheelchair today to try to walk down to a smoking or at her facility and fell while going down a slope that is uneven.  It was unwitnessed however patient denies losing consciousness.  She is having some headache and was placed into a cervical low station collar and is here for evaluation.  She reports she is feeling normal now aside from mild headache.  Family says that she has been on antibiotics for UTI and is doing much better from that standpoint.  They report no recent fevers, chills, cough, chest pain, shortness of breath, nausea, vomiting, constipation, or diarrhea patient otherwise says she is at her baseline.  Family see her mental status is normal now.  On my exam, I do not see any evidence of laceration or large hematomas on her head.  Neck was nontender.  She is in a cervical collar on arrival.  Chest is nontender, abdomen nontender.  Lungs clear.  Patient moving all extremities.  She does have a murmur.  Had a shared decision made conversation with family about work-up.  Given her otherwise being at her baseline and looking well,  they agree that they did not think she needs extensive  labs or urine work-up at this time.  They do feel that a head and neck CT are reasonable given the fall but if those are reassuring they agree the patient is safe for discharge back to her facility.  Will get CT head neck and if they are reassuring, anticipate discharge home.  CT of the head and neck were reassuring.  Patient reported having some nausea and vomiting and Zofran was given.  We will allow her to pass a p.o. challenge but then will be able to send her back to facility.  Anticipate discharge after p.o. challenge.  Patient reports her nausea is gone and she did not need the nausea medication that was ordered.  Patient feels well and will go home.  Final Clinical Impression(s) / ED Diagnoses Final diagnoses:  Fall, initial encounter  Nausea    Clinical Impression: 1. Fall, initial encounter   2. Nausea     Disposition: Discharge  Condition: Good  I have discussed the results, Dx and Tx plan with the pt(& family if present). He/she/they expressed understanding and agree(s) with the plan. Discharge instructions discussed at great length. Strict return precautions discussed and pt &/or family have verbalized understanding of the instructions. No further questions at time of discharge.    New Prescriptions   No medications on file    Follow Up: [place], [place] [address] [place] [address] [id number] mc [place]       Mccall Will, Gwenyth Allegra, MD 05/03/21 2245

## 2021-05-03 NOTE — ED Notes (Signed)
Patient returned from [place].  Transport tech report patient vomited x 1 on way back.  Patient c/o "upset stomach when I get tired."

## 2021-05-03 NOTE — Discharge Instructions (Signed)
Your history, exam and work-up today are consistent with a fall and the CT imaging was overall reassuring.  You demonstrated stability for over 3-1/2 hours and we feel you are safe for discharge home.  We had a shared decision-making conversation with your family and agreed to hold on more extensive or further work-up today.  Please rest and stay hydrated.  Please follow-up with your primary doctor.  If any symptoms change or worsen, please return to the nearest emergency department.

## 2021-05-03 NOTE — ED Notes (Signed)
Patient taken to CT at this time.

## 2021-05-03 NOTE — ED Notes (Signed)
Called [facility] to transport pt to [facility]

## 2021-05-04 DIAGNOSIS — R4182 Altered mental status, unspecified: Secondary | ICD-10-CM | POA: Diagnosis not present

## 2021-05-04 DIAGNOSIS — Z743 Need for continuous supervision: Secondary | ICD-10-CM | POA: Diagnosis not present

## 2021-05-04 NOTE — ED Notes (Signed)
Patient resting quietly in hallway.  Eyes closed and respirations equal and unlabored. Waiting for transportation home

## 2021-05-04 NOTE — ED Notes (Signed)
Pt changed. Pt incontinent

## 2021-05-04 NOTE — ED Notes (Signed)
Patient continues to rest quietly in hallway.  Waiting for transportation by to SNF.  Respirations even and unlabored.

## 2021-05-04 NOTE — ED Notes (Signed)
Called patient's sister [name] as requested when sister went home.  No answer from patient's sister.  Unable to leave a voicemail.

## 2021-05-04 NOTE — ED Notes (Signed)
[facility] here to transport patient back to facility.  Attempted to call report.  No answer from facility at this time.  Will attempt again shortly

## 2021-05-04 NOTE — ED Notes (Signed)
Patient waiting for transportation back to SNF.  No c/o voiced at this time.  Will continue to monitor

## 2021-05-04 NOTE — ED Notes (Signed)
Attempted to call report x 2 to [facility].  No answer from SNF.  Unable to give report prior to patient returning to SNF

## 2021-05-13 ENCOUNTER — Ambulatory Visit (HOSPITAL_COMMUNITY)
Admission: RE | Admit: 2021-05-13 | Payer: Medicare HMO | Source: Ambulatory Visit | Attending: Cardiology | Admitting: Cardiology

## 2021-05-13 ENCOUNTER — Encounter (HOSPITAL_COMMUNITY): Payer: Self-pay

## 2021-05-20 ENCOUNTER — Inpatient Hospital Stay (HOSPITAL_COMMUNITY): Payer: Medicare Other

## 2021-05-20 ENCOUNTER — Inpatient Hospital Stay (HOSPITAL_COMMUNITY)
Admission: EM | Admit: 2021-05-20 | Discharge: 2021-05-25 | DRG: 871 | Disposition: A | Discharge status: Hospice | Payer: Medicare Other | Source: Skilled Nursing Facility | Attending: Internal Medicine | Admitting: Internal Medicine

## 2021-05-20 ENCOUNTER — Encounter (HOSPITAL_COMMUNITY): Payer: Self-pay | Admitting: Emergency Medicine

## 2021-05-20 ENCOUNTER — Emergency Department (HOSPITAL_COMMUNITY): Payer: Medicare Other

## 2021-05-20 ENCOUNTER — Other Ambulatory Visit: Payer: Self-pay

## 2021-05-20 DIAGNOSIS — D631 Anemia in chronic kidney disease: Secondary | ICD-10-CM | POA: Diagnosis present

## 2021-05-20 DIAGNOSIS — Z880 Allergy status to penicillin: Secondary | ICD-10-CM

## 2021-05-20 DIAGNOSIS — E11649 Type 2 diabetes mellitus with hypoglycemia without coma: Secondary | ICD-10-CM | POA: Diagnosis present

## 2021-05-20 DIAGNOSIS — F0392 Unspecified dementia, unspecified severity, with psychotic disturbance: Secondary | ICD-10-CM | POA: Diagnosis present

## 2021-05-20 DIAGNOSIS — Z87891 Personal history of nicotine dependence: Secondary | ICD-10-CM

## 2021-05-20 DIAGNOSIS — J189 Pneumonia, unspecified organism: Secondary | ICD-10-CM | POA: Diagnosis not present

## 2021-05-20 DIAGNOSIS — U071 COVID-19: Secondary | ICD-10-CM | POA: Diagnosis present

## 2021-05-20 DIAGNOSIS — E1151 Type 2 diabetes mellitus with diabetic peripheral angiopathy without gangrene: Secondary | ICD-10-CM | POA: Diagnosis present

## 2021-05-20 DIAGNOSIS — F03918 Unspecified dementia, unspecified severity, with other behavioral disturbance: Secondary | ICD-10-CM | POA: Diagnosis present

## 2021-05-20 DIAGNOSIS — E871 Hypo-osmolality and hyponatremia: Secondary | ICD-10-CM | POA: Diagnosis present

## 2021-05-20 DIAGNOSIS — R739 Hyperglycemia, unspecified: Secondary | ICD-10-CM | POA: Diagnosis not present

## 2021-05-20 DIAGNOSIS — E7800 Pure hypercholesterolemia, unspecified: Secondary | ICD-10-CM | POA: Diagnosis not present

## 2021-05-20 DIAGNOSIS — R0902 Hypoxemia: Secondary | ICD-10-CM | POA: Diagnosis not present

## 2021-05-20 DIAGNOSIS — Z8249 Family history of ischemic heart disease and other diseases of the circulatory system: Secondary | ICD-10-CM

## 2021-05-20 DIAGNOSIS — G9341 Metabolic encephalopathy: Secondary | ICD-10-CM | POA: Diagnosis present

## 2021-05-20 DIAGNOSIS — N189 Chronic kidney disease, unspecified: Secondary | ICD-10-CM

## 2021-05-20 DIAGNOSIS — J9601 Acute respiratory failure with hypoxia: Secondary | ICD-10-CM | POA: Diagnosis present

## 2021-05-20 DIAGNOSIS — N185 Chronic kidney disease, stage 5: Secondary | ICD-10-CM | POA: Diagnosis present

## 2021-05-20 DIAGNOSIS — N179 Acute kidney failure, unspecified: Secondary | ICD-10-CM | POA: Diagnosis present

## 2021-05-20 DIAGNOSIS — E119 Type 2 diabetes mellitus without complications: Secondary | ICD-10-CM

## 2021-05-20 DIAGNOSIS — I120 Hypertensive chronic kidney disease with stage 5 chronic kidney disease or end stage renal disease: Secondary | ICD-10-CM | POA: Diagnosis present

## 2021-05-20 DIAGNOSIS — N390 Urinary tract infection, site not specified: Secondary | ICD-10-CM | POA: Diagnosis present

## 2021-05-20 DIAGNOSIS — N281 Cyst of kidney, acquired: Secondary | ICD-10-CM | POA: Diagnosis not present

## 2021-05-20 DIAGNOSIS — Z8673 Personal history of transient ischemic attack (TIA), and cerebral infarction without residual deficits: Secondary | ICD-10-CM

## 2021-05-20 DIAGNOSIS — E1110 Type 2 diabetes mellitus with ketoacidosis without coma: Secondary | ICD-10-CM | POA: Diagnosis present

## 2021-05-20 DIAGNOSIS — E8729 Other acidosis: Secondary | ICD-10-CM | POA: Diagnosis present

## 2021-05-20 DIAGNOSIS — Z885 Allergy status to narcotic agent status: Secondary | ICD-10-CM

## 2021-05-20 DIAGNOSIS — I480 Paroxysmal atrial fibrillation: Secondary | ICD-10-CM | POA: Diagnosis present

## 2021-05-20 DIAGNOSIS — B9620 Unspecified Escherichia coli [E. coli] as the cause of diseases classified elsewhere: Secondary | ICD-10-CM | POA: Diagnosis present

## 2021-05-20 DIAGNOSIS — M810 Age-related osteoporosis without current pathological fracture: Secondary | ICD-10-CM | POA: Diagnosis present

## 2021-05-20 DIAGNOSIS — R609 Edema, unspecified: Secondary | ICD-10-CM | POA: Diagnosis not present

## 2021-05-20 DIAGNOSIS — E875 Hyperkalemia: Secondary | ICD-10-CM | POA: Diagnosis present

## 2021-05-20 DIAGNOSIS — Z952 Presence of prosthetic heart valve: Secondary | ICD-10-CM | POA: Diagnosis not present

## 2021-05-20 DIAGNOSIS — I10 Essential (primary) hypertension: Secondary | ICD-10-CM | POA: Diagnosis not present

## 2021-05-20 DIAGNOSIS — E860 Dehydration: Secondary | ICD-10-CM | POA: Diagnosis present

## 2021-05-20 DIAGNOSIS — G9340 Encephalopathy, unspecified: Secondary | ICD-10-CM | POA: Diagnosis not present

## 2021-05-20 DIAGNOSIS — D649 Anemia, unspecified: Secondary | ICD-10-CM | POA: Diagnosis not present

## 2021-05-20 DIAGNOSIS — J1282 Pneumonia due to coronavirus disease 2019: Secondary | ICD-10-CM | POA: Diagnosis present

## 2021-05-20 DIAGNOSIS — R69 Illness, unspecified: Secondary | ICD-10-CM | POA: Diagnosis not present

## 2021-05-20 DIAGNOSIS — Z66 Do not resuscitate: Secondary | ICD-10-CM | POA: Diagnosis present

## 2021-05-20 DIAGNOSIS — Z7189 Other specified counseling: Secondary | ICD-10-CM | POA: Diagnosis not present

## 2021-05-20 DIAGNOSIS — R5381 Other malaise: Secondary | ICD-10-CM | POA: Diagnosis not present

## 2021-05-20 DIAGNOSIS — Z888 Allergy status to other drugs, medicaments and biological substances status: Secondary | ICD-10-CM

## 2021-05-20 DIAGNOSIS — E1122 Type 2 diabetes mellitus with diabetic chronic kidney disease: Secondary | ICD-10-CM | POA: Diagnosis present

## 2021-05-20 DIAGNOSIS — R6521 Severe sepsis with septic shock: Secondary | ICD-10-CM | POA: Diagnosis present

## 2021-05-20 DIAGNOSIS — R7989 Other specified abnormal findings of blood chemistry: Secondary | ICD-10-CM | POA: Diagnosis present

## 2021-05-20 DIAGNOSIS — N184 Chronic kidney disease, stage 4 (severe): Secondary | ICD-10-CM | POA: Diagnosis not present

## 2021-05-20 DIAGNOSIS — Y95 Nosocomial condition: Secondary | ICD-10-CM | POA: Diagnosis present

## 2021-05-20 DIAGNOSIS — I959 Hypotension, unspecified: Secondary | ICD-10-CM | POA: Diagnosis not present

## 2021-05-20 DIAGNOSIS — A4189 Other specified sepsis: Secondary | ICD-10-CM | POA: Diagnosis present

## 2021-05-20 DIAGNOSIS — L8990 Pressure ulcer of unspecified site, unspecified stage: Secondary | ICD-10-CM | POA: Insufficient documentation

## 2021-05-20 DIAGNOSIS — I2510 Atherosclerotic heart disease of native coronary artery without angina pectoris: Secondary | ICD-10-CM | POA: Diagnosis present

## 2021-05-20 DIAGNOSIS — Z743 Need for continuous supervision: Secondary | ICD-10-CM | POA: Diagnosis not present

## 2021-05-20 DIAGNOSIS — R4189 Other symptoms and signs involving cognitive functions and awareness: Secondary | ICD-10-CM | POA: Diagnosis not present

## 2021-05-20 DIAGNOSIS — Z79899 Other long term (current) drug therapy: Secondary | ICD-10-CM

## 2021-05-20 DIAGNOSIS — Z515 Encounter for palliative care: Secondary | ICD-10-CM | POA: Diagnosis not present

## 2021-05-20 DIAGNOSIS — I129 Hypertensive chronic kidney disease with stage 1 through stage 4 chronic kidney disease, or unspecified chronic kidney disease: Secondary | ICD-10-CM | POA: Diagnosis not present

## 2021-05-20 DIAGNOSIS — Z7982 Long term (current) use of aspirin: Secondary | ICD-10-CM

## 2021-05-20 DIAGNOSIS — Z882 Allergy status to sulfonamides status: Secondary | ICD-10-CM

## 2021-05-20 LAB — CBC WITH DIFFERENTIAL/PLATELET
Abs Immature Granulocytes: 0 10*3/uL (ref 0.00–0.07)
Band Neutrophils: 7 %
Basophils Absolute: 0 10*3/uL (ref 0.0–0.1)
Basophils Relative: 0 %
Eosinophils Absolute: 0 10*3/uL (ref 0.0–0.5)
Eosinophils Relative: 0 %
HCT: 32.9 % — ABNORMAL LOW (ref 36.0–46.0)
Hemoglobin: 10.7 g/dL — ABNORMAL LOW (ref 12.0–15.0)
Lymphocytes Relative: 3 %
Lymphs Abs: 0.6 10*3/uL — ABNORMAL LOW (ref 0.7–4.0)
MCH: 28.9 pg (ref 26.0–34.0)
MCHC: 32.5 g/dL (ref 30.0–36.0)
MCV: 88.9 fL (ref 80.0–100.0)
Monocytes Absolute: 1 10*3/uL (ref 0.1–1.0)
Monocytes Relative: 5 %
Neutro Abs: 18 10*3/uL — ABNORMAL HIGH (ref 1.7–7.7)
Neutrophils Relative %: 85 %
Platelets: 292 10*3/uL (ref 150–400)
RBC: 3.7 MIL/uL — ABNORMAL LOW (ref 3.87–5.11)
RDW: 13.4 % (ref 11.5–15.5)
WBC: 19.6 10*3/uL — ABNORMAL HIGH (ref 4.0–10.5)
nRBC: 0 % (ref 0.0–0.2)

## 2021-05-20 LAB — COMPREHENSIVE METABOLIC PANEL
ALT: 10 U/L (ref 0–44)
AST: 14 U/L — ABNORMAL LOW (ref 15–41)
Albumin: 3.6 g/dL (ref 3.5–5.0)
Alkaline Phosphatase: 56 U/L (ref 38–126)
Anion gap: 13 (ref 5–15)
BUN: 85 mg/dL — ABNORMAL HIGH (ref 8–23)
CO2: 19 mmol/L — ABNORMAL LOW (ref 22–32)
Calcium: 8.8 mg/dL — ABNORMAL LOW (ref 8.9–10.3)
Chloride: 96 mmol/L — ABNORMAL LOW (ref 98–111)
Creatinine, Ser: 4.02 mg/dL — ABNORMAL HIGH (ref 0.44–1.00)
GFR, Estimated: 11 mL/min — ABNORMAL LOW (ref >60)
Glucose, Bld: 546 mg/dL (ref 70–99)
Potassium: 5.5 mmol/L — ABNORMAL HIGH (ref 3.5–5.1)
Sodium: 128 mmol/L — ABNORMAL LOW (ref 135–145)
Total Bilirubin: 0.5 mg/dL (ref 0.3–1.2)
Total Protein: 8.4 g/dL — ABNORMAL HIGH (ref 6.5–8.1)

## 2021-05-20 LAB — URINALYSIS, ROUTINE W REFLEX MICROSCOPIC
Bilirubin Urine: NEGATIVE
Glucose, UA: >=1000 mg/dL — AB
Nitrite: NEGATIVE
Protein, ur: 30 mg/dL — AB
Specific Gravity, Urine: 1.025 (ref 1.005–1.030)
WBC, UA: >50 WBC/hpf — ABNORMAL HIGH (ref 0–5)
pH: 5.5 (ref 5.0–8.0)

## 2021-05-20 LAB — RESP PANEL BY RT-PCR (FLU A&B, COVID) ARPGX2
Influenza A by PCR: NEGATIVE
Influenza B by PCR: NEGATIVE
SARS Coronavirus 2 by RT PCR: POSITIVE — AB

## 2021-05-20 LAB — PROCALCITONIN: Procalcitonin: 2.35 ng/mL

## 2021-05-20 LAB — I-STAT CHEM 8, ED
BUN: 97 mg/dL — ABNORMAL HIGH (ref 8–23)
Calcium, Ion: 1.15 mmol/L (ref 1.15–1.40)
Chloride: 99 mmol/L (ref 98–111)
Creatinine, Ser: 4.4 mg/dL — ABNORMAL HIGH (ref 0.44–1.00)
Glucose, Bld: 548 mg/dL (ref 70–99)
HCT: 33 % — ABNORMAL LOW (ref 36.0–46.0)
Hemoglobin: 11.2 g/dL — ABNORMAL LOW (ref 12.0–15.0)
Potassium: 5.4 mmol/L — ABNORMAL HIGH (ref 3.5–5.1)
Sodium: 128 mmol/L — ABNORMAL LOW (ref 135–145)
TCO2: 20 mmol/L — ABNORMAL LOW (ref 22–32)

## 2021-05-20 LAB — CBG MONITORING, ED
Glucose-Capillary: 515 mg/dL (ref 70–99)
Glucose-Capillary: 439 mg/dL — ABNORMAL HIGH (ref 70–99)
Glucose-Capillary: 370 mg/dL — ABNORMAL HIGH (ref 70–99)
Glucose-Capillary: 303 mg/dL — ABNORMAL HIGH (ref 70–99)
Glucose-Capillary: 263 mg/dL — ABNORMAL HIGH (ref 70–99)
Glucose-Capillary: 178 mg/dL — ABNORMAL HIGH (ref 70–99)
Glucose-Capillary: 163 mg/dL — ABNORMAL HIGH (ref 70–99)
Glucose-Capillary: 144 mg/dL — ABNORMAL HIGH (ref 70–99)

## 2021-05-20 LAB — BASIC METABOLIC PANEL
Anion gap: 13 (ref 5–15)
BUN: 75 mg/dL — ABNORMAL HIGH (ref 8–23)
CO2: 16 mmol/L — ABNORMAL LOW (ref 22–32)
Calcium: 8.2 mg/dL — ABNORMAL LOW (ref 8.9–10.3)
Chloride: 106 mmol/L (ref 98–111)
Creatinine, Ser: 3.71 mg/dL — ABNORMAL HIGH (ref 0.44–1.00)
GFR, Estimated: 12 mL/min — ABNORMAL LOW (ref >60)
Glucose, Bld: 172 mg/dL — ABNORMAL HIGH (ref 70–99)
Potassium: 4.8 mmol/L (ref 3.5–5.1)
Sodium: 135 mmol/L (ref 135–145)

## 2021-05-20 LAB — LACTIC ACID, PLASMA
Lactic Acid, Venous: 2.2 mmol/L (ref 0.5–1.9)
Lactic Acid, Venous: 4.6 mmol/L (ref 0.5–1.9)

## 2021-05-20 LAB — BLOOD GAS, VENOUS
Acid-base deficit: 6.8 mmol/L — ABNORMAL HIGH (ref 0.0–2.0)
Bicarbonate: 18.9 mmol/L — ABNORMAL LOW (ref 20.0–28.0)
O2 Saturation: 16.9 %
Patient temperature: 98.6
pCO2, Ven: 40.6 mmHg — ABNORMAL LOW (ref 44.0–60.0)
pH, Ven: 7.289 (ref 7.250–7.430)
pO2, Ven: <31 mmHg — CL (ref 32.0–45.0)

## 2021-05-20 LAB — FIBRINOGEN: Fibrinogen: 573 mg/dL — ABNORMAL HIGH (ref 210–475)

## 2021-05-20 LAB — LACTATE DEHYDROGENASE: LDH: 125 U/L (ref 98–192)

## 2021-05-20 LAB — C-REACTIVE PROTEIN: CRP: 17.1 mg/dL — ABNORMAL HIGH (ref <1.0)

## 2021-05-20 LAB — BETA-HYDROXYBUTYRIC ACID: Beta-Hydroxybutyric Acid: 1.09 mmol/L — ABNORMAL HIGH (ref 0.05–0.27)

## 2021-05-20 LAB — CREATININE, URINE, RANDOM: Creatinine, Urine: 109.06 mg/dL

## 2021-05-20 LAB — FERRITIN: Ferritin: 307 ng/mL (ref 11–307)

## 2021-05-20 LAB — HEMOGLOBIN A1C
Hgb A1c MFr Bld: 9.9 % — ABNORMAL HIGH (ref 4.8–5.6)
Mean Plasma Glucose: 237.43 mg/dL

## 2021-05-20 LAB — SODIUM, URINE, RANDOM: Sodium, Ur: 28 mmol/L

## 2021-05-20 LAB — TRIGLYCERIDES: Triglycerides: 401 mg/dL — ABNORMAL HIGH (ref <150)

## 2021-05-20 LAB — D-DIMER, QUANTITATIVE: D-Dimer, Quant: 2.77 ug/mL-FEU — ABNORMAL HIGH (ref 0.00–0.50)

## 2021-05-20 IMAGING — DX DG CHEST 1V PORT
1 series · 1 of 1 positions shown · non-contrast
Comparison: [DATE]

CLINICAL DATA: Hypoxia.  [J8] positive.

EXAM:
PORTABLE CHEST 1 VIEW

[chest ap]
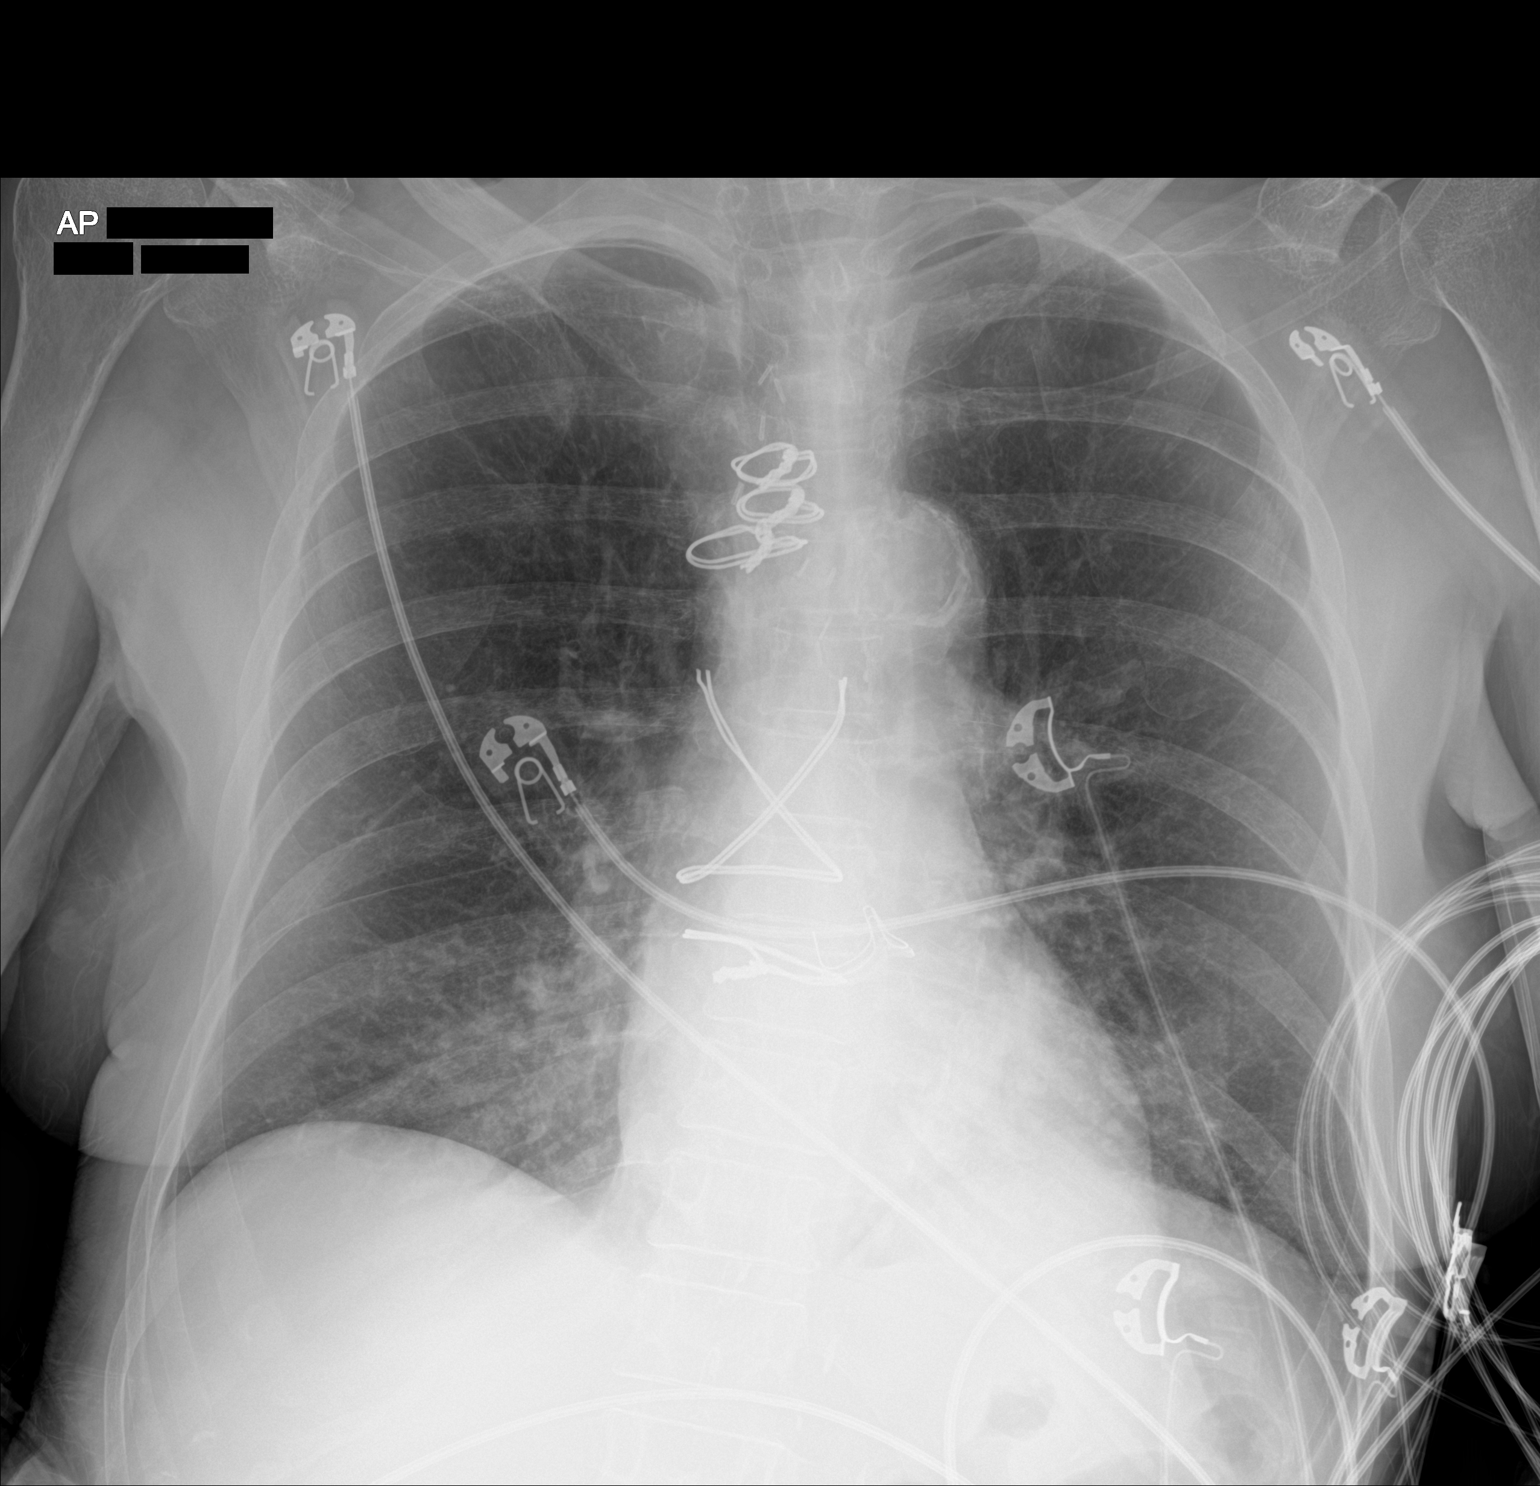

[1 of 1 positions shown; findings below may reference images not displayed]

FINDINGS: Prior median sternotomy. Numerous leads and wires project over the
chest. Midline trachea. Normal heart size. Atherosclerosis in the
transverse aorta. No pleural effusion or pneumothorax. Airspace
disease in both medial lower lobes.
IMPRESSION: Bibasilar airspace disease, suspicious for pneumonia.

Aortic Atherosclerosis ([J8]-[J8]).

## 2021-05-20 IMAGING — US US RENAL
1 series · 15 of 25 positions shown · non-contrast
Comparison: [DATE]

CLINICAL DATA: Acute kidney injury.

EXAM:
RENAL / URINARY TRACT ULTRASOUND COMPLETE

[Series 1: us renal mc & wl · 15 of 63 slices shown]
[im 1/63]
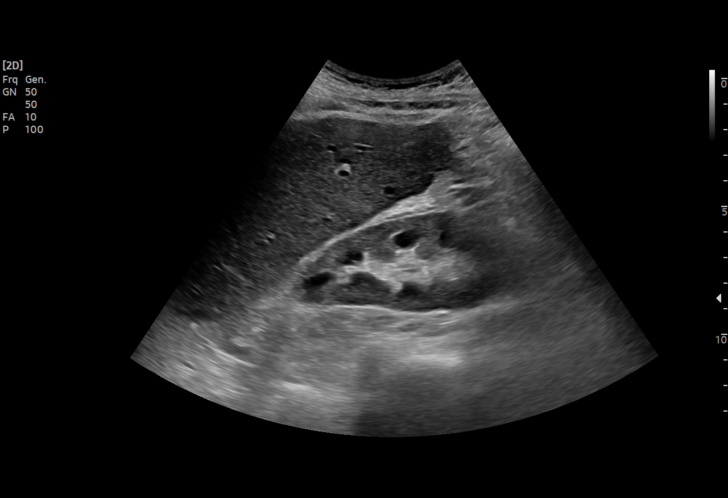
[im 6/63]
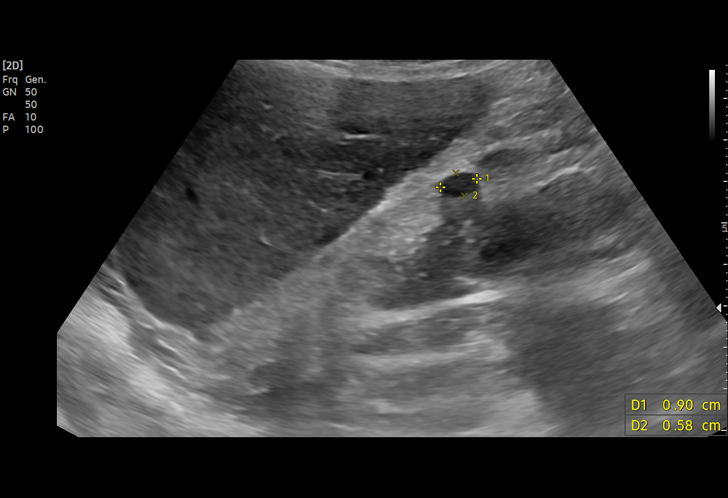
[im 11/63]
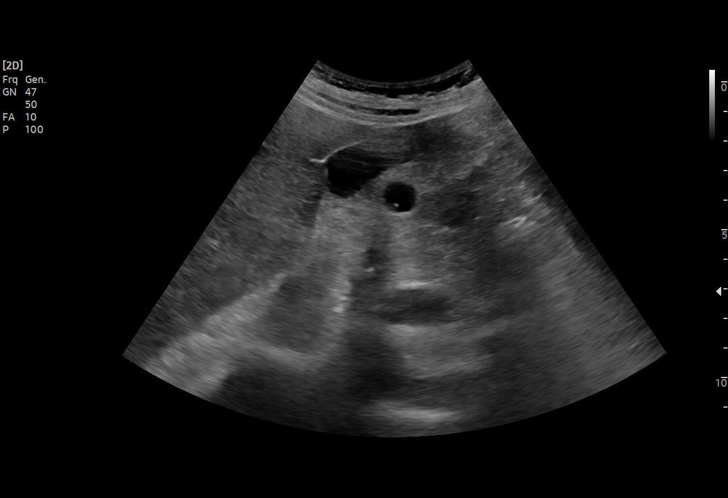
[im 13/63]
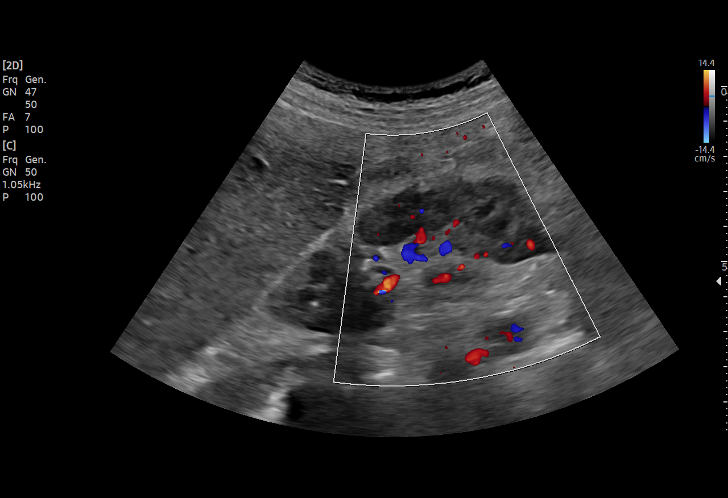
[im 19/63]
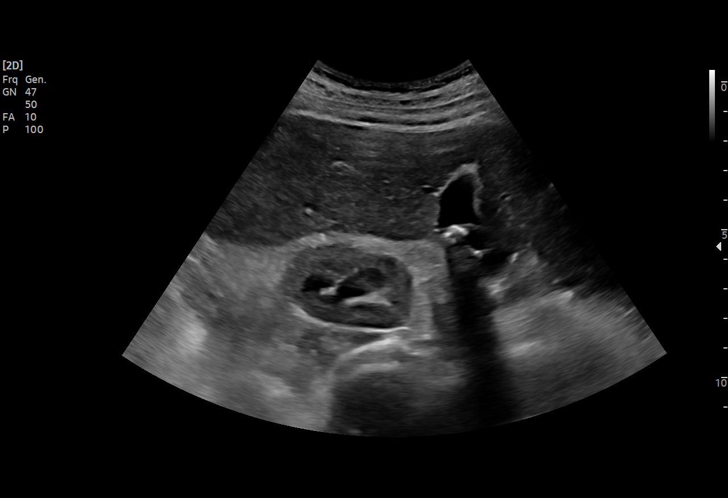
[im 24/63]
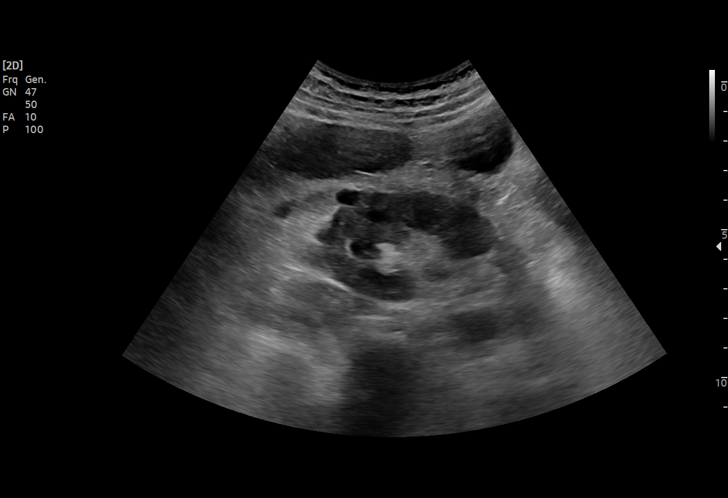
[im 26/63]
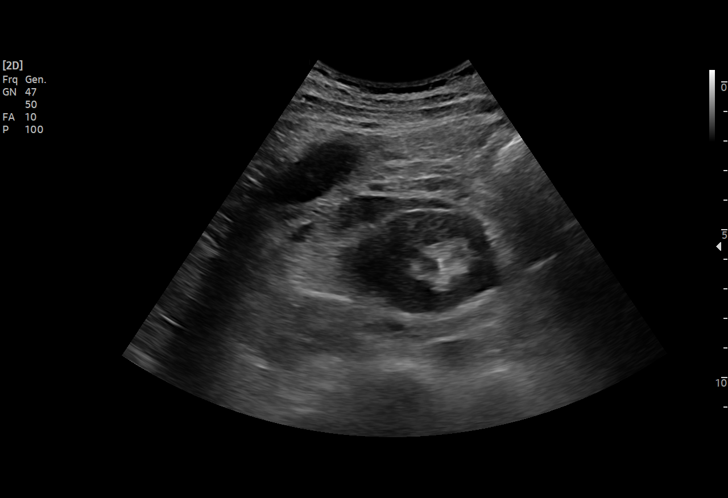
[im 32/63]
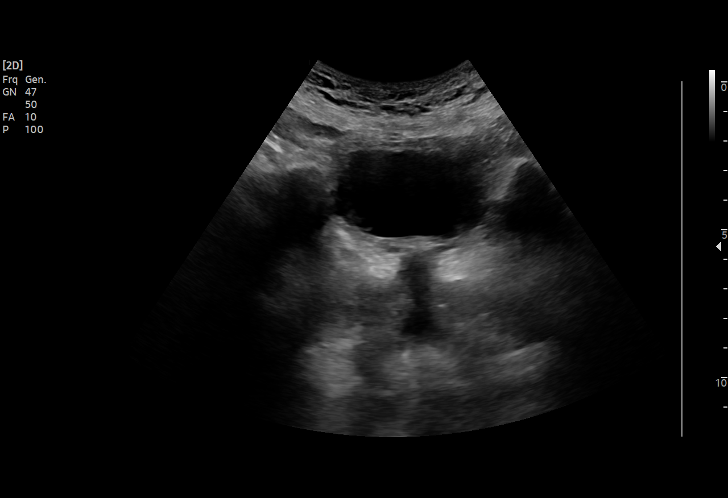
[im 37/63]
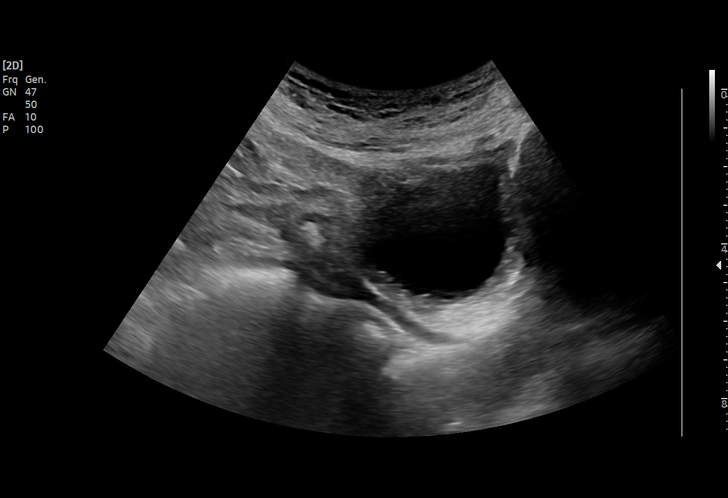
[im 39/63]
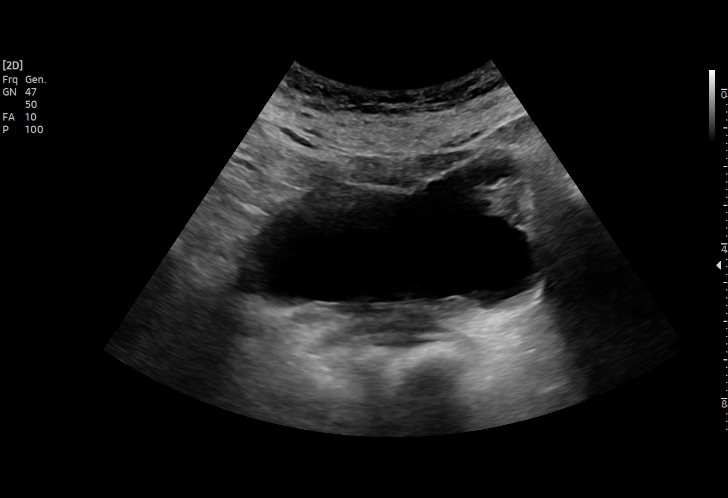
[im 44/63]
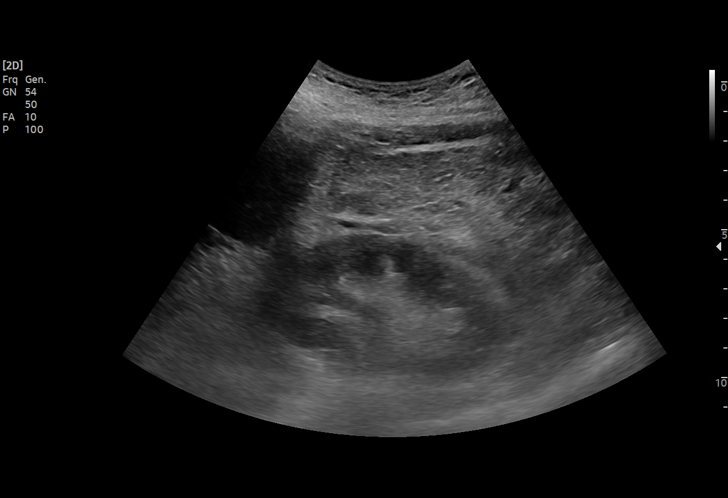
[im 50/63]
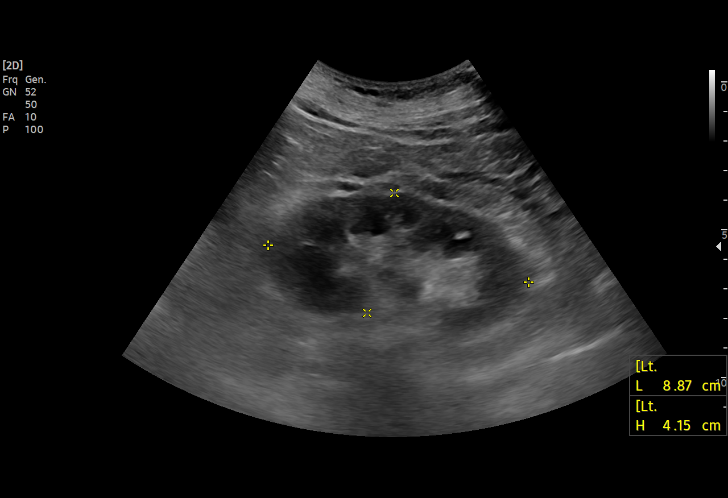
[im 52/63]
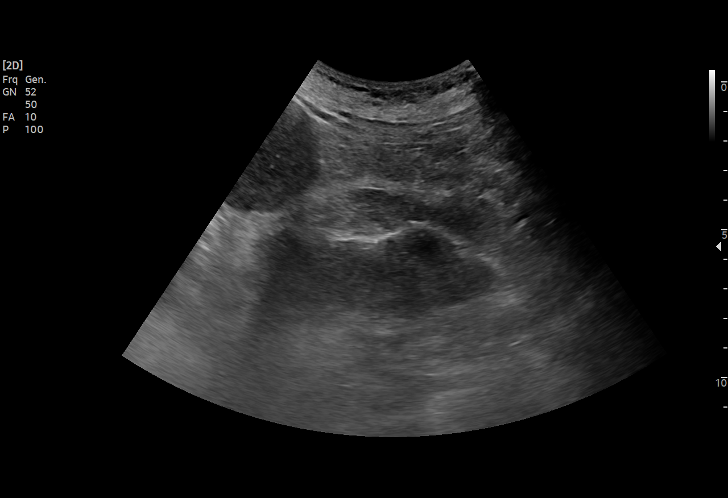
[im 57/63]
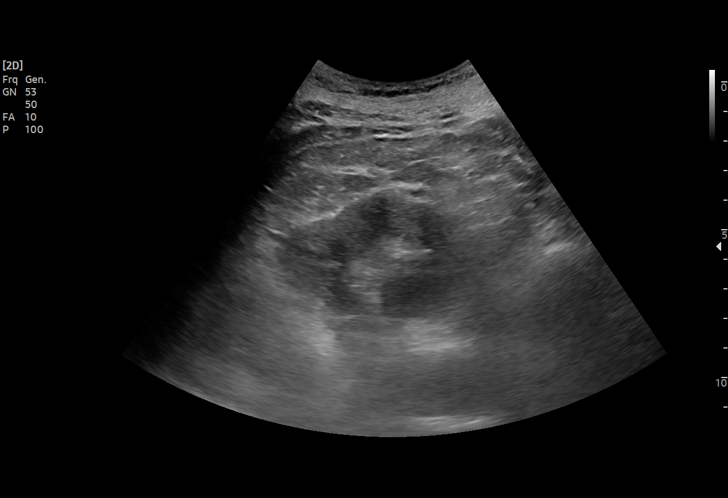
[im 63/63]
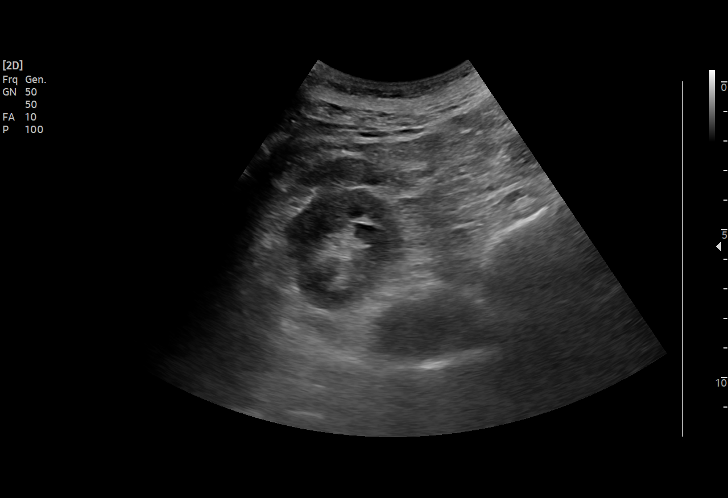

[15 of 25 positions shown; findings below may reference images not displayed]

FINDINGS: Right Kidney:

Renal measurements: 9.5 by 3.7 by 4.7 cm = volume: 86 mL.
Echogenicity within normal limits. No solid mass or hydronephrosis
visualized. Two right renal cysts are present, 11.1 cm in diameter
in the other 1.0 cm in diameter.

Left Kidney:

Renal measurements: 8.9 by 4.6 by 3.3 cm = volume: 70 mL.
Echogenicity within normal limits. No solid mass or hydronephrosis
visualized. A left renal cyst measures 1.6 by 1.4 by 1.5 cm.

Bladder:

Irregularity posteriorly in the urinary bladder nonspecific but
could be secondary to nondistention.

Other:

Cholelithiasis noted.
IMPRESSION: 1. Relatively small kidneys favoring renal atrophy.
2. Small bilateral renal cysts.
3. Irregularity posteriorly in the urinary bladder is nonspecific
but possibly secondary to nondistention.
4. Cholelithiasis.

## 2021-05-20 MED ORDER — INSULIN ASPART 100 UNIT/ML IJ SOLN
5.0000 [IU] | Freq: Once | INTRAMUSCULAR | Status: AC
  Administered 2021-05-20: 5 [IU] via INTRAVENOUS
  Filled 2021-05-20: qty 0.05

## 2021-05-20 MED ORDER — CHLORHEXIDINE GLUCONATE CLOTH 2 % EX PADS
6.0000 | MEDICATED_PAD | Freq: Every day | CUTANEOUS | Status: DC
  Administered 2021-05-21 – 2021-05-25 (×4): 6 via TOPICAL

## 2021-05-20 MED ORDER — SODIUM CHLORIDE 0.9 % IV SOLN
200.0000 mg | Freq: Once | INTRAVENOUS | Status: AC
  Administered 2021-05-20: 200 mg via INTRAVENOUS
  Filled 2021-05-20: qty 40

## 2021-05-20 MED ORDER — ORAL CARE MOUTH RINSE
15.0000 mL | Freq: Two times a day (BID) | OROMUCOSAL | Status: DC
  Administered 2021-05-21 – 2021-05-24 (×9): 15 mL via OROMUCOSAL

## 2021-05-20 MED ORDER — VANCOMYCIN HCL IN DEXTROSE 1-5 GM/200ML-% IV SOLN
1000.0000 mg | Freq: Once | INTRAVENOUS | Status: AC
  Administered 2021-05-20: 1000 mg via INTRAVENOUS
  Filled 2021-05-20: qty 200

## 2021-05-20 MED ORDER — LACTATED RINGERS IV SOLN: INTRAVENOUS | Status: DC

## 2021-05-20 MED ORDER — DEXTROSE IN LACTATED RINGERS 5 % IV SOLN: INTRAVENOUS | Status: DC

## 2021-05-20 MED ORDER — SODIUM CHLORIDE 0.9 % IV BOLUS
1000.0000 mL | Freq: Once | INTRAVENOUS | Status: AC
  Administered 2021-05-20: 1000 mL via INTRAVENOUS

## 2021-05-20 MED ORDER — VANCOMYCIN VARIABLE DOSE PER UNSTABLE RENAL FUNCTION (PHARMACIST DOSING): Status: DC

## 2021-05-20 MED ORDER — DEXAMETHASONE SODIUM PHOSPHATE 10 MG/ML IJ SOLN
6.0000 mg | INTRAMUSCULAR | Status: DC
  Administered 2021-05-20: 6 mg via INTRAVENOUS
  Filled 2021-05-20: qty 1

## 2021-05-20 MED ORDER — ACETAMINOPHEN 650 MG RE SUPP
650.0000 mg | Freq: Once | RECTAL | Status: AC
  Administered 2021-05-20: 650 mg via RECTAL
  Filled 2021-05-20: qty 1

## 2021-05-20 MED ORDER — DEXTROSE 50 % IV SOLN
0.0000 mL | INTRAVENOUS | Status: DC | PRN
  Administered 2021-05-24 – 2021-05-25 (×2): 50 mL via INTRAVENOUS
  Filled 2021-05-20 (×2): qty 50

## 2021-05-20 MED ORDER — CEFEPIME HCL 2 G IJ SOLR
2.0000 g | Freq: Once | INTRAMUSCULAR | Status: AC
  Administered 2021-05-20: 2 g via INTRAVENOUS
  Filled 2021-05-20: qty 2

## 2021-05-20 MED ORDER — IPRATROPIUM-ALBUTEROL 20-100 MCG/ACT IN AERS
2.0000 | INHALATION_SPRAY | Freq: Four times a day (QID) | RESPIRATORY_TRACT | Status: DC
Start: 2021-05-20 — End: 2021-05-21
  Filled 2021-05-20: qty 4

## 2021-05-20 MED ORDER — SODIUM CHLORIDE 0.9 % IV SOLN
100.0000 mg | Freq: Every day | INTRAVENOUS | Status: AC
  Administered 2021-05-21 – 2021-05-24 (×4): 100 mg via INTRAVENOUS
  Filled 2021-05-20: qty 20
  Filled 2021-05-20: qty 100
  Filled 2021-05-20: qty 20

## 2021-05-20 MED ORDER — IPRATROPIUM-ALBUTEROL 0.5-2.5 (3) MG/3ML IN SOLN
3.0000 mL | Freq: Four times a day (QID) | RESPIRATORY_TRACT | Status: DC
  Administered 2021-05-20: 3 mL via RESPIRATORY_TRACT
  Filled 2021-05-20: qty 3

## 2021-05-20 MED ORDER — SODIUM CHLORIDE 0.9 % IV SOLN
1.0000 g | INTRAVENOUS | Status: DC
Start: 2021-05-21 — End: 2021-05-22
  Administered 2021-05-21: 1 g via INTRAVENOUS
  Filled 2021-05-20 (×2): qty 1

## 2021-05-20 MED ORDER — INSULIN REGULAR(HUMAN) IN NACL 100-0.9 UT/100ML-% IV SOLN
INTRAVENOUS | Status: DC
  Administered 2021-05-20: 7 [IU]/h via INTRAVENOUS
  Filled 2021-05-20: qty 100

## 2021-05-20 NOTE — Progress Notes (Signed)
Pharmacy Antibiotic Note  Sandra Sheppard is a 75 y.o. female with a h/o CDK IV admitted on 05/20/2021 with sepsis possibly d/t PNA and COVID-19 infection.  Pharmacy has been consulted for vancomycin and cefepime dosing.  Plan: Vancomycin dosing by levels with AKI on CKD IV  Cefepime 2 g iv once given in ED followed by 1 g iv q 24 hours  Will f/u renal function, culture results, and clinical course Levels if/when indicated   Height: 5\' 5"  (165.1 cm) Weight: 53.5 kg (117 lb 15.1 oz) IBW/kg (Calculated) : 57  Temp (24hrs), Avg:97.6 F (36.4 C), Min:97.6 F (36.4 C), Max:97.6 F (36.4 C)  Recent Labs  Lab 05/20/21 1328 05/20/21 1424 05/20/21 1644  WBC 19.6*  --   --   CREATININE 4.02* 4.40*  --   LATICACIDVEN 2.2*  --  4.6*    Estimated Creatinine Clearance: 9.2 mL/min (A) (by C-G formula based on SCr of 4.4 mg/dL (H)).    Allergies  Allergen Reactions   Ativan [Lorazepam] Other (See Comments)    Confusion   Codeine Other (See Comments)    Rash, Nausea and Vomiting    Sulfonamide Derivatives Other (See Comments)    Unspecified   Penicillins Rash    Has patient had a PCN reaction causing immediate rash, facial/tongue/throat swelling, SOB or lightheadedness with hypotension: YES Has patient had a PCN reaction causing severe rash involving mucus membranes or skin necrosis:NO Has patient had a PCN reaction that required hospitalization NO Has patient had a PCN reaction occurring within the last 10 years: NO If all of the above answers are "NO", then may proceed with Cephalosporin use.    Antimicrobials this admission: 12/8 cefepime >> 12/8 vanc >>   Dose adjustments this admission:  Microbiology results: 12/8 BCx:  12/8 UCx:   12/8 MRSA PCR:   Thank you for allowing pharmacy to be a part of this patient's care.  Napoleon Form 05/20/2021 7:53 PM

## 2021-05-20 NOTE — Consult Note (Addendum)
[facility]  INPATIENT CONSULTATION  Reason for Consultation: AKI on CKD Requesting Provider: Dr. Billy Fischer  HPI: Sandra Sheppard is an 75 y.o. female with HTN, HL, h/o AoV replacement, Atrial fibrillation, DM type 2, dementia, PVD, CKD 4 currently presenting with COVID 19 PNA who nephrology is consulted for evaluation and management of AKI on CKD.   She had an AKI episode in 07/2018 in the setting of N/V -- Cr 13 on presentation but improved with supportive care.   Since then her baseline has been in the 2-2.7mg /dL range.  She had an admission 03/2021 encephalopathy AMS, UTI.    Today she presented to the ED today from her SNF [facility] with fever, + COVID, hypoxia 89% RA.   Labs showing Na 128, K 5.5, Bicarb 19, BUN 85, Cr 4.0, Gluc 546, T prot 8.4, Alb 3.6, WBC 19.6k, Hb 10.7, Plt 292, lactate 2.2.  BHBA 1.09. VBG 7.29 / 41. CXR with BL opacities c/w PNA.  She's being treated with remdesivir, cefepime, Vanc, 1L NS then LR 100/hr and IV insulin.   She's unable to provide any history.   PMH: Past Medical History:  Diagnosis Date   ABDOMINAL AORTIC ANEURYSM    Atrial fibrillation (HCC)    CAROTID STENOSIS    CKD (chronic kidney disease), stage V (HCC)    Complication of anesthesia    Confusion    CVD (cardiovascular disease)    Diabetes ([place])    no meds at present time   FIBRILLATION, ATRIAL    HYPERCHOLESTEROLEMIA  IIA    HYPERTENSION, MILD    MURMUR    Osteoporosis    PERIPHERAL VASCULAR DISEASE    PONV (postoperative nausea and vomiting)    STENOSIS, MITRAL AND AORTIC VALVES    PSH: Past Surgical History:  Procedure Laterality Date   AORTIC VALVE REPLACEMENT     08/14/2008.  Dr. Roxy Manns   MYRINGOTOMY WITH TUBE PLACEMENT Bilateral 03/01/2019   Procedure: MYRINGOTOMY WITH  T TUBE PLACEMENT;  Surgeon: Leta Baptist, MD;  Location: [facility];  Service: ENT;  Laterality: Bilateral;   TOTAL ABDOMINAL HYSTERECTOMY       Past Medical History:  Diagnosis  Date   ABDOMINAL AORTIC ANEURYSM    Atrial fibrillation ([place])    CAROTID STENOSIS    CKD (chronic kidney disease), stage V ([place])    Complication of anesthesia    Confusion    CVD (cardiovascular disease)    Diabetes ([place])    no meds at present time   FIBRILLATION, ATRIAL    HYPERCHOLESTEROLEMIA  IIA    HYPERTENSION, MILD    MURMUR    Osteoporosis    PERIPHERAL VASCULAR DISEASE    PONV (postoperative nausea and vomiting)    STENOSIS, MITRAL AND AORTIC VALVES     Medications:  I have reviewed the patient's current medications.   (Not in a hospital admission)   ALLERGIES:   Allergies  Allergen Reactions   Ativan [Lorazepam] Other (See Comments)    Confusion   Codeine Other (See Comments)    Rash, Nausea and Vomiting    Sulfonamide Derivatives Other (See Comments)    Unspecified   Penicillins Rash    Has patient had a PCN reaction causing immediate rash, facial/tongue/throat swelling, SOB or lightheadedness with hypotension: YES Has patient had a PCN reaction causing severe rash involving mucus membranes or skin necrosis:NO Has patient had a PCN reaction that required hospitalization NO Has patient had a PCN reaction occurring within the  last 10 years: NO If all of the above answers are "NO", then may proceed with Cephalosporin use.    FAM HX: Family History  Problem Relation Age of Onset   Colon polyps Other    Heart disease Mother    Heart disease Father    Heart disease Brother     Social History:   reports that she has quit smoking. She smoked an average of .25 packs per day. She has never used smokeless tobacco. She reports that she does not drink alcohol and does not use drugs.  ROS: unable to obtain from patient with AMS and dementia  Blood pressure (!) 136/59, pulse 92, temperature 97.6 F (36.4 C), temperature source Oral, resp. rate (!) 23, height 5\' 5"  (1.651 m), weight 53.5 kg, SpO2 95 %. PHYSICAL EXAM: Gen: thin older woman lying on bed in no  distress  Eyes: anicteric, EOMI appear intact ENT: MM dry, edentulous Neck: supple, no JVD CV: tachycardic and regular without rub Abd: soft, thin Lungs: coughing, O2 sat 96 % on 4L [place], diffuse rhonchi, no rales GU: purewick with ~373mL yellow urine in can Extr: no edema, dec muscle mass Neuro: not following commands, mumbling incomprehensibly Skin: warm and dry, no rashes    Results for orders placed or performed during the hospital encounter of 05/20/21 (from the past 48 hour(s))  CBG monitoring, ED     Status: Abnormal   Collection Time: 05/20/21  1:14 PM  Result Value Ref Range   Glucose-Capillary 515 (HH) 70 - 99 mg/dL    Comment: Glucose reference range applies only to samples taken after fasting for at least 8 hours.   Comment 1 Notify RN   Resp Panel by RT-PCR (Flu A&B, Covid) Nasopharyngeal Swab     Status: Abnormal   Collection Time: 05/20/21  1:15 PM   Specimen: Nasopharyngeal Swab; Nasopharyngeal(NP) swabs in vial transport medium  Result Value Ref Range   SARS Coronavirus 2 by RT PCR POSITIVE (A) NEGATIVE    Comment: RESULT CALLED TO, READ BACK BY AND VERIFIED WITH: BRATU,D. EMTP AT 1434 05/20/21 MULLINS,T (NOTE) SARS-CoV-2 target nucleic acids are DETECTED.  The SARS-CoV-2 RNA is generally detectable in upper respiratory specimens during the acute phase of infection. Positive results are indicative of the presence of the identified virus, but do not rule out bacterial infection or co-infection with other pathogens not detected by the test. Clinical correlation with patient history and other diagnostic information is necessary to determine patient infection status. The expected result is Negative.  Fact Sheet for Patients: EntrepreneurPulse.com.au  Fact Sheet for Healthcare Providers: IncredibleEmployment.be  This test is not yet approved or cleared by the [place] FDA and  has been authorized for detection and/or  diagnosis of SARS-CoV-2 by FDA under an Emergency Use Authorization (EUA).  This EUA will remain in effect (meaning this test  can be used) for the duration of  the COVID-19 declaration under Section 564(b)(1) of the Act, 21 U.S.C. section 360bbb-3(b)(1), unless the authorization is terminated or revoked sooner.     Influenza A by PCR NEGATIVE NEGATIVE   Influenza B by PCR NEGATIVE NEGATIVE    Comment: (NOTE) The Xpert Xpress SARS-CoV-2/FLU/RSV plus assay is intended as an aid in the diagnosis of influenza from Nasopharyngeal swab specimens and should not be used as a sole basis for treatment. Nasal washings and aspirates are unacceptable for Xpert Xpress SARS-CoV-2/FLU/RSV testing.  Fact Sheet for Patients: EntrepreneurPulse.com.au  Fact Sheet for Healthcare Providers: IncredibleEmployment.be  This  test is not yet approved or cleared by the [place] and has been authorized for detection and/or diagnosis of SARS-CoV-2 by FDA under an Emergency Use Authorization (EUA). This EUA will remain in effect (meaning this test can be used) for the duration of the COVID-19 declaration under Section 564(b)(1) of the Act, 21 U.S.C. section 360bbb-3(b)(1), unless the authorization is terminated or revoked.  Performed at [facility], [place] [address], [place], [place] [address]   Lactic acid, plasma     Status: Abnormal   Collection Time: 05/20/21  1:28 PM  Result Value Ref Range   Lactic Acid, Venous 2.2 (HH) 0.5 - 1.9 mmol/L    Comment: CRITICAL RESULT CALLED TO, READ BACK BY AND VERIFIED WITH: BRATU,D. EMTP AT 1434 05/20/21 MULLINS,T Performed at [facility], [place] [address], [place], [place] [address]   CBC WITH DIFFERENTIAL     Status: Abnormal (Preliminary result)   Collection Time: 05/20/21  1:28 PM  Result Value Ref Range   WBC 19.6 (H) 4.0 - 10.5 K/uL   RBC 3.70 (L) 3.87 - 5.11 MIL/uL    Hemoglobin 10.7 (L) 12.0 - 15.0 g/dL   HCT 32.9 (L) 36.0 - 46.0 %   MCV 88.9 80.0 - 100.0 fL   MCH 28.9 26.0 - 34.0 pg   MCHC 32.5 30.0 - 36.0 g/dL   RDW 13.4 11.5 - 15.5 %   Platelets 292 150 - 400 K/uL   nRBC 0.0 0.0 - 0.2 %    Comment: Performed at [facility], [place] [address] [place], [place] [address]   Neutrophils Relative % PENDING %   Neutro Abs PENDING 1.7 - 7.7 K/uL   Band Neutrophils PENDING %   Lymphocytes Relative PENDING %   Lymphs Abs PENDING 0.7 - 4.0 K/uL   Monocytes Relative PENDING %   Monocytes Absolute PENDING 0.1 - 1.0 K/uL   Eosinophils Relative PENDING %   Eosinophils Absolute PENDING 0.0 - 0.5 K/uL   Basophils Relative PENDING %   Basophils Absolute PENDING 0.0 - 0.1 K/uL   WBC Morphology PENDING    RBC Morphology PENDING    Smear Review PENDING    Other PENDING %   nRBC PENDING 0 /100 WBC   Metamyelocytes Relative PENDING %   Myelocytes PENDING %   Promyelocytes Relative PENDING %   Blasts PENDING %   Immature Granulocytes PENDING %   Abs Immature Granulocytes PENDING 0.00 - 0.07 K/uL  Comprehensive metabolic panel     Status: Abnormal   Collection Time: 05/20/21  1:28 PM  Result Value Ref Range   Sodium 128 (L) 135 - 145 mmol/L   Potassium 5.5 (H) 3.5 - 5.1 mmol/L    Comment: NO VISIBLE HEMOLYSIS   Chloride 96 (L) 98 - 111 mmol/L   CO2 19 (L) 22 - 32 mmol/L   Glucose, Bld 546 (HH) 70 - 99 mg/dL    Comment: Glucose reference range applies only to samples taken after fasting for at least 8 hours. CRITICAL RESULT CALLED TO, READ BACK BY AND VERIFIED WITH: BRATU,D. EMTP AT 1434 05/20/21 MULLINS,T    BUN 85 (H) 8 - 23 mg/dL   Creatinine, Ser 4.02 (H) 0.44 - 1.00 mg/dL   Calcium 8.8 (L) 8.9 - 10.3 mg/dL   Total Protein 8.4 (H) 6.5 - 8.1 g/dL   Albumin 3.6 3.5 - 5.0 g/dL   AST 14 (L) 15 - 41 U/L   ALT 10 0 - 44  U/L   Alkaline Phosphatase 56 38 - 126 U/L   Total Bilirubin 0.5 0.3 - 1.2 mg/dL   GFR, Estimated 11 (L) >60 mL/min     Comment: (NOTE) Calculated using the CKD-EPI Creatinine Equation (2021)    Anion gap 13 5 - 15    Comment: Performed at [facility], [place] [address], [place], [place] [address]  D-dimer, quantitative     Status: Abnormal   Collection Time: 05/20/21  1:28 PM  Result Value Ref Range   D-Dimer, Quant 2.77 (H) 0.00 - 0.50 ug/mL-FEU    Comment: (NOTE) At the manufacturer cut-off value of 0.5 g/mL FEU, this assay has a negative predictive value of 95-100%.This assay is intended for use in conjunction with a clinical pretest probability (PTP) assessment model to exclude pulmonary embolism (PE) and deep venous thrombosis (DVT) in outpatients suspected of PE or DVT. Results should be correlated with clinical presentation. Performed at [facility], [place] [address] [place], [place] [address]   Procalcitonin     Status: None   Collection Time: 05/20/21  1:28 PM  Result Value Ref Range   Procalcitonin 2.35 ng/mL    Comment:        Interpretation: PCT > 2 ng/mL: Systemic infection (sepsis) is likely, unless other causes are known. (NOTE)       Sepsis PCT Algorithm           Lower Respiratory Tract                                      Infection PCT Algorithm    ----------------------------     ----------------------------         PCT < 0.25 ng/mL                PCT < 0.10 ng/mL          Strongly encourage             Strongly discourage   discontinuation of antibiotics    initiation of antibiotics    ----------------------------     -----------------------------       PCT 0.25 - 0.50 ng/mL            PCT 0.10 - 0.25 ng/mL               OR       >80% decrease in PCT            Discourage initiation of                                            antibiotics      Encourage discontinuation           of antibiotics    ----------------------------     -----------------------------         PCT >= 0.50 ng/mL              PCT 0.26 - 0.50 ng/mL                AND       <80% decrease in PCT              Encourage initiation of  antibiotics       Encourage continuation           of antibiotics    ----------------------------     -----------------------------        PCT >= 0.50 ng/mL                  PCT > 0.50 ng/mL               AND         increase in PCT                  Strongly encourage                                      initiation of antibiotics    Strongly encourage escalation           of antibiotics                                     -----------------------------                                           PCT <= 0.25 ng/mL                                                 OR                                        > 80% decrease in PCT                                      Discontinue / Do not initiate                                             antibiotics  Performed at [place] [address], [place], [place] [address]   Lactate dehydrogenase     Status: None   Collection Time: 05/20/21  1:28 PM  Result Value Ref Range   LDH 125 98 - 192 U/L    Comment: Performed at [facility], [place] [address] [place], [place] [address]  Ferritin     Status: None   Collection Time: 05/20/21  1:28 PM  Result Value Ref Range   Ferritin 307 11 - 307 ng/mL    Comment: Performed at [facility], [place] [address], [place], [place] [address]  Triglycerides     Status: Abnormal   Collection Time: 05/20/21  1:28 PM  Result Value Ref Range   Triglycerides 401 (H) <150 mg/dL    Comment: Performed at [facility], [place] [address], [place], [place] [address]  Fibrinogen     Status: Abnormal   Collection Time: 05/20/21  1:28 PM  Result Value  Ref Range   Fibrinogen 573 (H) 210 - 475 mg/dL    Comment: (NOTE) Fibrinogen results may be underestimated in patients receiving thrombolytic therapy. Performed at [facility], [place] [address] [place], [place] [address]   Beta-hydroxybutyric acid     Status: Abnormal   Collection Time: 05/20/21  1:32 PM  Result Value Ref Range   Beta-Hydroxybutyric Acid 1.09 (H) 0.05 - 0.27 mmol/L    Comment: Performed at [facility], [place] [address] [place], [place] [address]  Blood gas, venous (at [facility] and [facility], not at [facility])     Status: Abnormal   Collection Time: 05/20/21  2:13 PM  Result Value Ref Range   pH, Ven 7.289 7.250 - 7.430   pCO2, Ven 40.6 (L) 44.0 - 60.0 mmHg   pO2, Ven <31.0 (LL) 32.0 - 45.0 mmHg    Comment: CRITICAL RESULT CALLED TO, READ BACK BY AND VERIFIED WITH: CONRAD Q @1443  05/20/2021 COTEJ    Bicarbonate 18.9 (L) 20.0 - 28.0 mmol/L   Acid-base deficit 6.8 (H) 0.0 - 2.0 mmol/L   O2 Saturation 16.9 %   Patient temperature 98.6     Comment: Performed at [facility], [place] [address], [place], [place] [address]  I-stat chem 8, ED (not at [facility] or [facility])     Status: Abnormal   Collection Time: 05/20/21  2:24 PM  Result Value Ref Range   Sodium 128 (L) 135 - 145 mmol/L   Potassium 5.4 (H) 3.5 - 5.1 mmol/L   Chloride 99 98 - 111 mmol/L   BUN 97 (H) 8 - 23 mg/dL   Creatinine, Ser 4.40 (H) 0.44 - 1.00 mg/dL   Glucose, Bld 548 (HH) 70 - 99 mg/dL    Comment: Glucose reference range applies only to samples taken after fasting for at least 8 hours.   Calcium, Ion 1.15 1.15 - 1.40 mmol/L   TCO2 20 (L) 22 - 32 mmol/L   Hemoglobin 11.2 (L) 12.0 - 15.0 g/dL   HCT 33.0 (L) 36.0 - 46.0 %   Comment NOTIFIED PHYSICIAN     DG Chest Port 1 View  Result Date: 05/20/2021 CLINICAL DATA:  Hypoxia.  COVID-19 positive. EXAM: PORTABLE CHEST 1 VIEW COMPARISON:  03/17/2021 FINDINGS: Prior median sternotomy. Numerous leads and wires project over the chest. Midline trachea. Normal heart size. Atherosclerosis in the transverse aorta. No pleural effusion or pneumothorax. Airspace disease in both medial lower lobes. IMPRESSION: Bibasilar  airspace disease, suspicious for pneumonia. Aortic Atherosclerosis (ICD10-I70.0). Electronically Signed   By: Abigail Miyamoto M.D.   On: 05/20/2021 14:08    Assessment/Plan **Acute hypoxic respiratory failure: secondary to COVID PNA poss bacterial superinfection. Being treated with remdesivir and broad spectrum antibiotics.    **Shock, presumably septic r/o hypovolemic: resuscitation and vasopressor support PRN.  Broad spectrum abx. Pan cultured.   **AKI on CKD 4:  Baseline Cr in low to mid 2s, now in 4s in the setting of shock and COVID 19 PNA.  I think her mental status is multifactorial and not predominantly uremia and otherwise there are no indications for RRT.  Hopefully she will improve with supportive care.  Agree with volume support.  UA pending, urine indices.  R/o obstruction but doubt. Follow I/Os, daily labs.  Avoid hypotension and nephrotoxins.    **DKA:  appears to have borderline DM at baseline - no meds on recent D/C summary.  Currently with marked hyperglycemia in setting of acute illness.   On insulin  gtt currently.  Per primary.   **Hyponatremia:  mostly corrects to normal for BG - follow with supportive care.   **Anemia: mild, no intervention needed at this time.   **Hyperkalemia:  mild, will improve with BG management.    **Dementia, acute encephalopathy  **Hypertension: normotensive to hypertensive.  On amlodipine at baseline, would hold for now to ensure hemodynamic stability to avoid hypotension.   Will follow, page with questions.   Justin Mend 05/20/2021, 3:36 PM

## 2021-05-20 NOTE — ED Notes (Signed)
Female purewick placed on pt °

## 2021-05-20 NOTE — ED Provider Notes (Signed)
[facility] DEPT Provider Note   CSN: [id number] Arrival date & time: 05/20/21  1246     History Chief Complaint  Patient presents with   Fever   Covid Positive    Sandra Sheppard is a 75 y.o. female.  HPI     75 year old female with a history of abdominal aortic aneurysm, atrial fibrillation, carotid stenosis, CKD stage V, diabetes, hypertension, hyperlipidemia, peripheral vascular disease, aortic and mitral valve stenosis with history of aortic valve replacement in 2010, dementia, presents from [facility] with concern for COVID positive, hyperglycemia, and low oxygen.  Per staff, patient was having oxygen saturations of 89% on room air. Per notes, baseline she is alert and oriented to self and sometimes hallucinates, had a recent admission for acute metabolic encephalopathy with increase hallucinations.  Today she is somnolent, altered.  History is limited, per EMS staff said saturation 89% ono room air.     Past Medical History:  Diagnosis Date   ABDOMINAL AORTIC ANEURYSM    Atrial fibrillation ([place])    CAROTID STENOSIS    CKD (chronic kidney disease), stage V ([place])    Complication of anesthesia    Confusion    CVD (cardiovascular disease)    Diabetes ([place])    no meds at present time   FIBRILLATION, ATRIAL    HYPERCHOLESTEROLEMIA  IIA    HYPERTENSION, MILD    MURMUR    Osteoporosis    PERIPHERAL VASCULAR DISEASE    PONV (postoperative nausea and vomiting)    STENOSIS, MITRAL AND AORTIC VALVES     Patient Active Problem List   Diagnosis Date Noted   Pneumonia due to COVID-19 virus 05/20/2021   UTI (urinary tract infection) 03/17/2021   Fall 03/17/2021   Closed fracture of right toe 03/17/2021   Dementia with behavioral disturbance 20/35/5974   Acute metabolic encephalopathy 16/38/4536   Syncope and collapse 08/17/2020   Protein-calorie malnutrition, severe 08/17/2020   Near syncope 08/14/2020   CKD (chronic kidney  disease) stage 4, GFR 15-29 ml/min ([place]) 08/13/2020   Tobacco abuse 09/22/2019   Educated about COVID-19 virus infection 09/22/2019   Asymptomatic bilateral carotid artery stenosis 09/22/2019   Acute kidney injury superimposed on CKD ([place])    Palliative care encounter    Hypocalcemia    FTT (failure to thrive) in adult    AKI (acute kidney injury) ([place]) 08/08/2018   Hyperkalemia 46/80/3212   Metabolic acidosis, increased anion gap 08/08/2018   Normocytic anemia 08/08/2018   Diabetes mellitus type II, non insulin dependent ([place]) 08/08/2018   Bilateral carotid artery disease ([place]) 07/12/2018   Dyslipidemia 07/12/2018   Vomiting 07/04/2016   Gastroenteritis 07/04/2016   CKD (chronic kidney disease) stage 3, GFR 30-59 ml/min ([place]) 07/04/2016   Occlusion and stenosis of carotid artery 04/28/2010   ABDOMINAL AORTIC ANEURYSM 04/28/2010   Peripheral vascular disease ([employer]) 12/08/2008   STENOSIS, MITRAL AND AORTIC VALVES 10/07/2008   Paroxysmal atrial fibrillation ([place]) 10/07/2008   H/O aortic valve replacement 08/14/2008   HYPERCHOLESTEROLEMIA  IIA 06/23/2008   HYPERTENSION, MILD 06/23/2008   MURMUR 06/23/2008    Past Surgical History:  Procedure Laterality Date   AORTIC VALVE REPLACEMENT     08/14/2008.  Dr. Roxy Manns   MYRINGOTOMY WITH TUBE PLACEMENT Bilateral 03/01/2019   Procedure: MYRINGOTOMY WITH  T TUBE PLACEMENT;  Surgeon: Leta Baptist, MD;  Location: [facility];  Service: ENT;  Laterality: Bilateral;   TOTAL ABDOMINAL HYSTERECTOMY       OB  History   No obstetric history on file.     Family History  Problem Relation Age of Onset   Colon polyps Other    Heart disease Mother    Heart disease Father    Heart disease Brother     Social History   Tobacco Use   Smoking status: Former    Packs/day: 0.25    Types: Cigarettes   Smokeless tobacco: Never  Substance Use Topics   Alcohol use: No   Drug use: Never    Home Medications Prior to Admission medications    Medication Sig Start Date End Date Taking? Authorizing Provider  acetaminophen (TYLENOL) 325 MG tablet Take 650 mg by mouth every 4 (four) hours as needed for mild pain. Do not exceed 3,000 mg in 24 hrs   Yes [provider]  amLODipine (NORVASC) 10 MG tablet Take 1 tablet (10 mg total) by mouth daily. 03/23/21  Yes Regalado, Belkys A, MD  aspirin 81 MG tablet Take 81 mg by mouth daily. 0900   Yes [provider]  Cholecalciferol (VITAMIN D-3) 1000 UNITS CAPS Take 2,000 Units by mouth daily. 0900   Yes [provider]  Choline Fenofibrate 45 MG capsule Take 45 mg by mouth daily. 0900   Yes [provider]  divalproex (DEPAKOTE) 125 MG DR tablet Take 125 mg by mouth 2 (two) times daily. 0900, 1700   Yes [provider]  docusate sodium (COLACE) 100 MG capsule Take 1 capsule (100 mg total) by mouth 2 (two) times daily. Patient taking differently: Take 100 mg by mouth 2 (two) times daily. 0900, 1800 08/31/18  Yes Hongalgi, Lenis Dickinson, MD  escitalopram (LEXAPRO) 5 MG tablet Take 5 mg by mouth daily. 0900 08/07/20  Yes [provider]  loratadine (CLARITIN) 10 MG tablet Take 10 mg by mouth daily as needed for allergies.   Yes [provider]  meclizine (ANTIVERT) 12.5 MG tablet Take 12.5 mg by mouth 3 (three) times daily as needed for dizziness.   Yes [provider]  Melatonin 10 MG TABS Take 10 mg by mouth at bedtime. 2100   Yes [provider]  Morphine Sulfate (MORPHINE CONCENTRATE) 10 mg / 0.5 ml concentrated solution Take 5 mg by mouth every hour as needed for severe pain or shortness of breath.   Yes [provider]  Multiple Vitamin (MULTIVITAMIN) capsule Take 1 capsule by mouth daily. 0900   Yes [provider]  Multiple Vitamins-Minerals (PRESERVISION AREDS 2+MULTI VIT) CAPS Take 1 capsule by mouth daily at 6 (six) AM. 0900   Yes [provider]  QUEtiapine (SEROQUEL) 25 MG tablet Take 1  tablet (25 mg total) by mouth at bedtime. Patient taking differently: Take 25 mg by mouth at bedtime. 2100 09/01/20  Yes Shah, Pratik D, DO  rosuvastatin (CRESTOR) 5 MG tablet Take 5 mg by mouth daily. 0900   Yes [provider]  senna-docusate (SENOKOT-S) 8.6-50 MG tablet Take 1 tablet by mouth at bedtime. Patient taking differently: Take 1 tablet by mouth at bedtime. 2100 09/01/20  Yes Shah, Pratik D, DO  sodium bicarbonate 650 MG tablet Take 1 tablet (650 mg total) by mouth 3 (three) times daily. 03/22/21  Yes Regalado, Belkys A, MD  vitamin B-12 1000 MCG tablet Take 1 tablet (1,000 mcg total) by mouth daily. Patient taking differently: Take 1,000 mcg by mouth daily. 0900 08/31/18  Yes Hongalgi, Lenis Dickinson, MD  OVER THE COUNTER MEDICATION Take 237 mLs by mouth 3 (  three) times daily between meals. House Supplement for nutritional support. 0900, 1400, 2100.    [provider]  zoledronic acid (RECLAST) 5 MG/100ML SOLN injection Inject 5 mg into the vein See admin instructions. Once a year    [provider]    Allergies    Ativan [lorazepam], Codeine, Sulfonamide derivatives, and Penicillins  Review of Systems   Review of Systems  Constitutional:  Positive for activity change, appetite change and fatigue.  Respiratory:  Positive for cough and shortness of breath.   Gastrointestinal:  Negative for vomiting.   Physical Exam Updated Vital Signs BP 108/62   Pulse 87   Temp 97.6 F (36.4 C) (Oral)   Resp (!) 24   Ht 5\' 5"  (1.651 m)   Wt 53.5 kg   SpO2 98%   BMI 19.63 kg/m   Physical Exam Vitals and nursing note reviewed.  Constitutional:      General: She is not in acute distress.    Appearance: She is ill-appearing and toxic-appearing. She is not diaphoretic.  HENT:     Head: Normocephalic.  Eyes:     Conjunctiva/sclera: Conjunctivae normal.  Cardiovascular:     Rate and Rhythm: Normal rate and regular rhythm.     Pulses: Normal pulses.  Pulmonary:      Effort: Pulmonary effort is normal. No respiratory distress.  Musculoskeletal:        General: No deformity or signs of injury.     Cervical back: No rigidity.  Skin:    General: Skin is warm and dry.     Coloration: Skin is not jaundiced or pale.  Neurological:     Comments: Answers no to questions, somnolent    ED Results / Procedures / Treatments   Labs (all labs ordered are listed, but only abnormal results are displayed) Labs Reviewed  RESP PANEL BY RT-PCR (FLU A&B, COVID) ARPGX2 - Abnormal; Notable for the following components:      Result Value   SARS Coronavirus 2 by RT PCR POSITIVE (*)    All other components within normal limits  LACTIC ACID, PLASMA - Abnormal; Notable for the following components:   Lactic Acid, Venous 2.2 (*)    All other components within normal limits  LACTIC ACID, PLASMA - Abnormal; Notable for the following components:   Lactic Acid, Venous 4.6 (*)    All other components within normal limits  CBC WITH DIFFERENTIAL/PLATELET - Abnormal; Notable for the following components:   WBC 19.6 (*)    RBC 3.70 (*)    Hemoglobin 10.7 (*)    HCT 32.9 (*)    Neutro Abs 18.0 (*)    Lymphs Abs 0.6 (*)    All other components within normal limits  COMPREHENSIVE METABOLIC PANEL - Abnormal; Notable for the following components:   Sodium 128 (*)    Potassium 5.5 (*)    Chloride 96 (*)    CO2 19 (*)    Glucose, Bld 546 (*)    BUN 85 (*)    Creatinine, Ser 4.02 (*)    Calcium 8.8 (*)    Total Protein 8.4 (*)    AST 14 (*)    GFR, Estimated 11 (*)    All other components within normal limits  D-DIMER, QUANTITATIVE - Abnormal; Notable for the following components:   D-Dimer, Quant 2.77 (*)    All other components within normal limits  TRIGLYCERIDES - Abnormal; Notable for the following components:   Triglycerides 401 (*)    All  other components within normal limits  FIBRINOGEN - Abnormal; Notable for the following components:   Fibrinogen 573 (*)    All  other components within normal limits  C-REACTIVE PROTEIN - Abnormal; Notable for the following components:   CRP 17.1 (*)    All other components within normal limits  BETA-HYDROXYBUTYRIC ACID - Abnormal; Notable for the following components:   Beta-Hydroxybutyric Acid 1.09 (*)    All other components within normal limits  BLOOD GAS, VENOUS - Abnormal; Notable for the following components:   pCO2, Ven 40.6 (*)    pO2, Ven <31.0 (*)    Bicarbonate 18.9 (*)    Acid-base deficit 6.8 (*)    All other components within normal limits  HEMOGLOBIN A1C - Abnormal; Notable for the following components:   Hgb A1c MFr Bld 9.9 (*)    All other components within normal limits  URINALYSIS, ROUTINE W REFLEX MICROSCOPIC - Abnormal; Notable for the following components:   APPearance CLOUDY (*)    Glucose, UA >=1000 (*)    Hgb urine dipstick MODERATE (*)    Ketones, ur TRACE (*)    Protein, ur 30 (*)    Leukocytes,Ua SMALL (*)    WBC, UA >50 (*)    Bacteria, UA MANY (*)    All other components within normal limits  CBG MONITORING, ED - Abnormal; Notable for the following components:   Glucose-Capillary 515 (*)    All other components within normal limits  I-STAT CHEM 8, ED - Abnormal; Notable for the following components:   Sodium 128 (*)    Potassium 5.4 (*)    BUN 97 (*)    Creatinine, Ser 4.40 (*)    Glucose, Bld 548 (*)    TCO2 20 (*)    Hemoglobin 11.2 (*)    HCT 33.0 (*)    All other components within normal limits  CBG MONITORING, ED - Abnormal; Notable for the following components:   Glucose-Capillary 439 (*)    All other components within normal limits  CBG MONITORING, ED - Abnormal; Notable for the following components:   Glucose-Capillary 370 (*)    All other components within normal limits  CBG MONITORING, ED - Abnormal; Notable for the following components:   Glucose-Capillary 303 (*)    All other components within normal limits  CBG MONITORING, ED - Abnormal; Notable for  the following components:   Glucose-Capillary 263 (*)    All other components within normal limits  CBG MONITORING, ED - Abnormal; Notable for the following components:   Glucose-Capillary 178 (*)    All other components within normal limits  CBG MONITORING, ED - Abnormal; Notable for the following components:   Glucose-Capillary 163 (*)    All other components within normal limits  CULTURE, BLOOD (ROUTINE X 2)  CULTURE, BLOOD (ROUTINE X 2)  URINE CULTURE  MRSA NEXT GEN BY PCR, NASAL  PROCALCITONIN  LACTATE DEHYDROGENASE  FERRITIN  PROCALCITONIN  BASIC METABOLIC PANEL  LACTIC ACID, PLASMA  BASIC METABOLIC PANEL  SODIUM, URINE, RANDOM  CREATININE, URINE, RANDOM  I-STAT CHEM 8, ED    EKG EKG Interpretation  Date/Time:  Thursday May 20 2021 13:19:14 EST Ventricular Rate:  92 PR Interval:  125 QRS Duration: 84 QT Interval:  365 QTC Calculation: 452 R Axis:   62 Text Interpretation: Sinus rhythm Nonspecific repol abnormality, diffuse leads No significant change since last tracing , similar TW changes to previous Confirmed by Gareth Morgan [phone]) on 05/20/2021 9:10:40 PM  Radiology US  RENAL  Result Date: 05/20/2021 CLINICAL DATA:  Acute kidney injury. EXAM: RENAL / URINARY TRACT ULTRASOUND COMPLETE COMPARISON:  08/08/2018 FINDINGS: Right Kidney: Renal measurements: 9.5 by 3.7 by 4.7 cm = volume: 86 mL. Echogenicity within normal limits. No solid mass or hydronephrosis visualized. Two right renal cysts are present, 11.1 cm in diameter in the other 1.0 cm in diameter. Left Kidney: Renal measurements: 8.9 by 4.6 by 3.3 cm = volume: 70 mL. Echogenicity within normal limits. No solid mass or hydronephrosis visualized. A left renal cyst measures 1.6 by 1.4 by 1.5 cm. Bladder: Irregularity posteriorly in the urinary bladder nonspecific but could be secondary to nondistention. Other: Cholelithiasis noted. IMPRESSION: 1. Relatively small kidneys favoring renal atrophy. 2. Small  bilateral renal cysts. 3. Irregularity posteriorly in the urinary bladder is nonspecific but possibly secondary to nondistention. 4. Cholelithiasis. Electronically Signed   By: Van Clines M.D.   On: 05/20/2021 20:36   DG Chest Port 1 View  Result Date: 05/20/2021 CLINICAL DATA:  Hypoxia.  COVID-19 positive. EXAM: PORTABLE CHEST 1 VIEW COMPARISON:  03/17/2021 FINDINGS: Prior median sternotomy. Numerous leads and wires project over the chest. Midline trachea. Normal heart size. Atherosclerosis in the transverse aorta. No pleural effusion or pneumothorax. Airspace disease in both medial lower lobes. IMPRESSION: Bibasilar airspace disease, suspicious for pneumonia. Aortic Atherosclerosis (ICD10-I70.0). Electronically Signed   By: Abigail Miyamoto M.D.   On: 05/20/2021 14:08   VAS [place] LOWER EXTREMITY VENOUS (DVT)  Result Date: 05/20/2021  Lower Venous DVT Study Patient Name:  Sandra Sheppard  Date of Exam:   05/20/2021 Medical Rec #: [id number]       Accession #:    [id number] Date of Birth: 04-05-1946        Patient Gender: F Patient Age:   59 years Exam Location:  [facility] Procedure:      VAS [place] LOWER EXTREMITY VENOUS (DVT) Referring Phys: [facility] --------------------------------------------------------------------------------  Indications: Edema.  Risk Factors: COVID 19 positive. Limitations: Poor ultrasound/tissue interface and patient positioning, patient movement, poor patient cooperation. Comparison Study: No prior studies. Performing Technologist: Oliver Hum RVT  Examination Guidelines: A complete evaluation includes B-mode imaging, spectral Doppler, color Doppler, and power Doppler as needed of all accessible portions of each vessel. Bilateral testing is considered an integral part of a complete examination. Limited examinations for reoccurring indications may be performed as noted. The reflux portion of the exam is performed with the patient in reverse Trendelenburg.   +---------+---------------+---------+-----------+----------+--------------+ RIGHT    CompressibilityPhasicitySpontaneityPropertiesThrombus Aging +---------+---------------+---------+-----------+----------+--------------+ CFV      Full           Yes      Yes                                 +---------+---------------+---------+-----------+----------+--------------+ SFJ      Full                                                        +---------+---------------+---------+-----------+----------+--------------+ FV Prox  Full                                                        +---------+---------------+---------+-----------+----------+--------------+  FV Mid   Full                                                        +---------+---------------+---------+-----------+----------+--------------+ FV Distal               Yes      Yes                                 +---------+---------------+---------+-----------+----------+--------------+ PFV      Full                                                        +---------+---------------+---------+-----------+----------+--------------+ POP      Full           Yes      Yes                                 +---------+---------------+---------+-----------+----------+--------------+ PTV      Full                                                        +---------+---------------+---------+-----------+----------+--------------+ PERO     Full                                                        +---------+---------------+---------+-----------+----------+--------------+   +---------+---------------+---------+-----------+----------+--------------+ LEFT     CompressibilityPhasicitySpontaneityPropertiesThrombus Aging +---------+---------------+---------+-----------+----------+--------------+ CFV      Full           Yes      Yes                                  +---------+---------------+---------+-----------+----------+--------------+ SFJ      Full                                                        +---------+---------------+---------+-----------+----------+--------------+ FV Prox  Full                                                        +---------+---------------+---------+-----------+----------+--------------+ FV Mid   Full                                                        +---------+---------------+---------+-----------+----------+--------------+  FV DistalFull                                                        +---------+---------------+---------+-----------+----------+--------------+ PFV      Full                                                        +---------+---------------+---------+-----------+----------+--------------+ POP      Full           Yes      Yes                                 +---------+---------------+---------+-----------+----------+--------------+ PTV      Full                                                        +---------+---------------+---------+-----------+----------+--------------+ PERO     Full                                                        +---------+---------------+---------+-----------+----------+--------------+     Summary: RIGHT: - There is no evidence of deep vein thrombosis in the lower extremity. However, portions of this examination were limited- see technologist comments above.  - No cystic structure found in the popliteal fossa.  LEFT: - There is no evidence of deep vein thrombosis in the lower extremity. However, portions of this examination were limited- see technologist comments above.  - No cystic structure found in the popliteal fossa.  *See table(s) above for measurements and observations. Electronically signed by Orlie Pollen on 05/20/2021 at 4:58:59 PM.    Final     Procedures .Critical Care Performed by: Gareth Morgan, MD Authorized  by: Gareth Morgan, MD   Critical care provider statement:    Critical care time (minutes):  30   Critical care was time spent personally by me on the following activities:  Development of treatment plan with patient or surrogate, discussions with consultants, evaluation of patient's response to treatment, examination of patient, ordering and review of laboratory studies, ordering and review of radiographic studies, ordering and performing treatments and interventions, pulse oximetry, re-evaluation of patient's condition and review of old charts   Medications Ordered in ED Medications  remdesivir 200 mg in sodium chloride 0.9% 250 mL IVPB (0 mg Intravenous Stopped 05/20/21 1451)    Followed by  remdesivir 100 mg in sodium chloride 0.9 % 100 mL IVPB (has no administration in time range)  lactated ringers infusion (0 mLs Intravenous Stopped 05/20/21 2017)  dextrose 5 % in lactated ringers infusion ( Intravenous New Bag/Given 05/20/21 2016)  dextrose 50 % solution 0-50 mL (has no administration in time range)  insulin regular, human (MYXREDLIN) 100 units/ 100 mL infusion (1.9 Units/hr Intravenous Rate/Dose Change 05/20/21 2145)  dexamethasone (DECADRON) injection 6 mg (6 mg Intravenous Given 05/20/21 1801)  Ipratropium-Albuterol (COMBIVENT) respimat 2 puff (2 puffs Inhalation Not Given 05/20/21 2115)  ceFEPIme (MAXIPIME) 1 g in sodium chloride 0.9 % 100 mL IVPB (has no administration in time range)  vancomycin variable dose per unstable renal function (pharmacist dosing) (has no administration in time range)  acetaminophen (TYLENOL) suppository 650 mg (650 mg Rectal Given 05/20/21 1351)  ceFEPIme (MAXIPIME) 2 g in sodium chloride 0.9 % 100 mL IVPB (0 g Intravenous Stopped 05/20/21 1546)  vancomycin (VANCOCIN) IVPB 1000 mg/200 mL premix ( Intravenous Restarted 05/20/21 1809)  insulin aspart (novoLOG) injection 5 Units (5 Units Intravenous Given 05/20/21 1517)  sodium chloride 0.9 % bolus 1,000 mL (1,000  mLs Intravenous Bolus 05/20/21 1515)    ED Course  I have reviewed the triage vital signs and the nursing notes.  Pertinent labs & imaging results that were available during my care of the patient were reviewed by me and considered in my medical decision making (see chart for details).    MDM Rules/Calculators/A&P                            75 year old female with a history of abdominal aortic aneurysm, atrial fibrillation, carotid stenosis, CKD stage V, diabetes, hypertension, hyperlipidemia, peripheral vascular disease, aortic and mitral valve stenosis with history of aortic valve replacement in 2010, dementia, presents from a [facility] with concern for COVID positive, hyperglycemia, and low oxygen.  Generally weak, somnolent however protecting her airway.  Saturations normal on 4L [place].  CXR with possible pneumonia,  vancomycin/cefepime ordered and 1L NS.  CMP with hyperglycemia, AKI with Cr 4, K 5.5, BUN 85. CBC with leukocytosis 19.6.  Discussed with Nephrology Dr. Johnney Ou, will keep at [facility]. GIven insulin for hyperkalemia/hyperglycemia.     Final Clinical Impression(s) / ED Diagnoses Final diagnoses:  AKI (acute kidney injury) ([place])  COVID-19  Hypoxia  Healthcare-associated pneumonia  Urinary tract infection without hematuria, site unspecified  Hyperkalemia  Metabolic encephalopathy    Rx / DC Orders ED Discharge Orders     None        Gareth Morgan, MD 05/20/21 2153

## 2021-05-20 NOTE — ED Notes (Signed)
Lab called to add Hemoglobin A1C.

## 2021-05-20 NOTE — Progress Notes (Signed)
A consult was received from an ED physician for Cefepime & Vancomycin per pharmacy dosing.  The patient's profile has been reviewed for ht/wt/allergies/indication/available labs.    A one time order has been placed for Cefepime 2gm & Vancomycin 1gm.  Further antibiotics/pharmacy consults should be ordered by admitting physician if indicated.                       Thank you,  Minda Ditto PharmD 05/20/2021  2:35 PM

## 2021-05-20 NOTE — Progress Notes (Signed)
VAST consulted to obtain IV access. Patient appears to have 2 PIV's in place. Attempted to contact patient's nurse via phone unsuccessful X2. SecureChat sent advising RN to seek help from ER colleagues for IV placement d/t wait time for VAST RN. At shift change, attempted again to contact ER nurse by phone and unsuccessful.

## 2021-05-20 NOTE — Progress Notes (Signed)
Bilateral lower extremity venous duplex has been completed. Preliminary results can be found in CV Proc through chart review.  Results were given to Dr. Horris Latino.  05/20/21 4:15 PM Carlos Levering RVT

## 2021-05-20 NOTE — H&P (Addendum)
History and Physical  DOMINIC RHOME PJA:[id number] DOB: 07-23-1945 DOA: 05/20/2021   Patient coming from: SNF  Chief Complaint: Hypoxia, COVID-positive  HPI: Sandra Sheppard is a 75 y.o. female with medical history significant for hypertension, hyperlipidemia, CKD stage IV, diabetes mellitus type 2, aortic valve replacement, dementia, presenting from SNF [facility] with complaints of hypoxia, fever, COVID-positive, and hyperglycemia.  Patient unable to give any history due to acute metabolic encephalopathy.  Per EMS staff, patient was reported to be saturating around 89% on room air and was placed on 4 L of O2.  In the ED, patient noted to be afebrile, tachycardic and heart rate in the 90s, BP stable, saturating around 96% on 4 L of O2.  Labs showed marked leukocytosis 19.6, LA 2.2-->4.6, glucose 546, K+ 5.5, NA 128, creatinine 4.02, above baseline, procalcitonin 2.35, COVID-19 positive, chest x-ray showed bibasilar airspace disease suspicious for pneumonia.  Patient was started on remdesivir, broad-spectrum antibiotics, IV fluids and 5 units of insulin.  Nephrology was consulted by EDP.  Patient admitted for further management.  ED Course: Review of systems are otherwise negative   Past Medical History:  Diagnosis Date   ABDOMINAL AORTIC ANEURYSM    Atrial fibrillation ([place])    CAROTID STENOSIS    CKD (chronic kidney disease), stage V (HCC)    Complication of anesthesia    Confusion    CVD (cardiovascular disease)    Diabetes ([place])    no meds at present time   FIBRILLATION, ATRIAL    HYPERCHOLESTEROLEMIA  IIA    HYPERTENSION, MILD    MURMUR    Osteoporosis    PERIPHERAL VASCULAR DISEASE    PONV (postoperative nausea and vomiting)    STENOSIS, MITRAL AND AORTIC VALVES    Past Surgical History:  Procedure Laterality Date   AORTIC VALVE REPLACEMENT     08/14/2008.  Dr. Roxy Manns   MYRINGOTOMY WITH TUBE PLACEMENT Bilateral 03/01/2019   Procedure: MYRINGOTOMY WITH  T TUBE PLACEMENT;   Surgeon: Leta Baptist, MD;  Location: [facility];  Service: ENT;  Laterality: Bilateral;   TOTAL ABDOMINAL HYSTERECTOMY      Social History:  reports that she has quit smoking. She smoked an average of .25 packs per day. She has never used smokeless tobacco. She reports that she does not drink alcohol and does not use drugs.   Allergies  Allergen Reactions   Ativan [Lorazepam] Other (See Comments)    Confusion   Codeine Other (See Comments)    Rash, Nausea and Vomiting    Sulfonamide Derivatives Other (See Comments)    Unspecified   Penicillins Rash    Has patient had a PCN reaction causing immediate rash, facial/tongue/throat swelling, SOB or lightheadedness with hypotension: YES Has patient had a PCN reaction causing severe rash involving mucus membranes or skin necrosis:NO Has patient had a PCN reaction that required hospitalization NO Has patient had a PCN reaction occurring within the last 10 years: NO If all of the above answers are "NO", then may proceed with Cephalosporin use.    Family History  Problem Relation Age of Onset   Colon polyps Other    Heart disease Mother    Heart disease Father    Heart disease Brother      Prior to Admission medications   Medication Sig Start Date End Date Taking? Authorizing Provider  acetaminophen (TYLENOL) 325 MG tablet Take 650 mg by mouth every 4 (four) hours as needed for mild pain. Do not exceed 3,000  mg in 24 hrs   Yes [provider]  amLODipine (NORVASC) 10 MG tablet Take 1 tablet (10 mg total) by mouth daily. 03/23/21  Yes Regalado, Belkys A, MD  aspirin 81 MG tablet Take 81 mg by mouth daily. 0900   Yes [provider]  Cholecalciferol (VITAMIN D-3) 1000 UNITS CAPS Take 2,000 Units by mouth daily. 0900   Yes [provider]  Choline Fenofibrate 45 MG capsule Take 45 mg by mouth daily. 0900   Yes [provider]  divalproex (DEPAKOTE) 125 MG DR tablet Take 125 mg by mouth 2 (two)  times daily. 0900, 1700   Yes [provider]  docusate sodium (COLACE) 100 MG capsule Take 1 capsule (100 mg total) by mouth 2 (two) times daily. Patient taking differently: Take 100 mg by mouth 2 (two) times daily. 0900, 1800 08/31/18  Yes Hongalgi, Lenis Dickinson, MD  escitalopram (LEXAPRO) 5 MG tablet Take 5 mg by mouth daily. 0900 08/07/20  Yes [provider]  loratadine (CLARITIN) 10 MG tablet Take 10 mg by mouth daily as needed for allergies.   Yes [provider]  meclizine (ANTIVERT) 12.5 MG tablet Take 12.5 mg by mouth 3 (three) times daily as needed for dizziness.   Yes [provider]  Melatonin 10 MG TABS Take 10 mg by mouth at bedtime. 2100   Yes [provider]  Morphine Sulfate (MORPHINE CONCENTRATE) 10 mg / 0.5 ml concentrated solution Take 5 mg by mouth every hour as needed for severe pain or shortness of breath.   Yes [provider]  Multiple Vitamin (MULTIVITAMIN) capsule Take 1 capsule by mouth daily. 0900   Yes [provider]  Multiple Vitamins-Minerals (PRESERVISION AREDS 2+MULTI VIT) CAPS Take 1 capsule by mouth daily at 6 (six) AM. 0900   Yes [provider]  QUEtiapine (SEROQUEL) 25 MG tablet Take 1 tablet (25 mg total) by mouth at bedtime. Patient taking differently: Take 25 mg by mouth at bedtime. 2100 09/01/20  Yes Shah, Pratik D, DO  rosuvastatin (CRESTOR) 5 MG tablet Take 5 mg by mouth daily. 0900   Yes [provider]  senna-docusate (SENOKOT-S) 8.6-50 MG tablet Take 1 tablet by mouth at bedtime. Patient taking differently: Take 1 tablet by mouth at bedtime. 2100 09/01/20  Yes Shah, Pratik D, DO  sodium bicarbonate 650 MG tablet Take 1 tablet (650 mg total) by mouth 3 (three) times daily. 03/22/21  Yes Regalado, Belkys A, MD  vitamin B-12 1000 MCG tablet Take 1 tablet (1,000 mcg total) by mouth daily. Patient taking differently: Take 1,000 mcg by mouth daily. 0900 08/31/18  Yes Hongalgi, Lenis Dickinson,  MD  OVER THE COUNTER MEDICATION Take 237 mLs by mouth 3 (three) times daily between meals. House Supplement for nutritional support. 0900, 1400, 2100.    [provider]  zoledronic acid (RECLAST) 5 MG/100ML SOLN injection Inject 5 mg into the vein See admin instructions. Once a year    [provider]    Physical Exam: BP (!) 136/59   Pulse 92   Temp 97.6 F (36.4 C) (Oral)   Resp (!) 22   Ht 5\' 5"  (1.651 m)   Wt 53.5 kg   SpO2 95%   BMI 19.63 kg/m   General: Lethargic, disoriented, altered Eyes: Normal ENT: Normal Neck: Supple Cardiovascular: S1, S2 present Respiratory: Noted bilateral wheezing Abdomen: Soft, nontender, nondistended, bowel sounds present Skin: Normal Musculoskeletal: No bilateral pedal edema noted Psychiatric: Altered, unable to assess  Neurologic: Altered, unable to follow commands          Labs on Admission:  Basic Metabolic Panel: Recent Labs  Lab 05/20/21 1328 05/20/21 1424  NA 128* 128*  K 5.5* 5.4*  CL 96* 99  CO2 19*  --   GLUCOSE 546* 548*  BUN 85* 97*  CREATININE 4.02* 4.40*  CALCIUM 8.8*  --    Liver Function Tests: Recent Labs  Lab 05/20/21 1328  AST 14*  ALT 10  ALKPHOS 56  BILITOT 0.5  PROT 8.4*  ALBUMIN 3.6   No results for input(s): LIPASE, AMYLASE in the last 168 hours. No results for input(s): AMMONIA in the last 168 hours. CBC: Recent Labs  Lab 05/20/21 1328 05/20/21 1424  WBC 19.6*  --   NEUTROABS PENDING  --   HGB 10.7* 11.2*  HCT 32.9* 33.0*  MCV 88.9  --   PLT 292  --    Cardiac Enzymes: No results for input(s): CKTOTAL, CKMB, CKMBINDEX, TROPONINI in the last 168 hours.  BNP (last 3 results) No results for input(s): BNP in the last 8760 hours.  ProBNP (last 3 results) No results for input(s): PROBNP in the last 8760 hours.  CBG: Recent Labs  Lab 05/20/21 1314 05/20/21 1606 05/20/21 1651  GLUCAP 515* 439* 370*    Radiological Exams on Admission: DG Chest Port 1  View  Result Date: 05/20/2021 CLINICAL DATA:  Hypoxia.  COVID-19 positive. EXAM: PORTABLE CHEST 1 VIEW COMPARISON:  03/17/2021 FINDINGS: Prior median sternotomy. Numerous leads and wires project over the chest. Midline trachea. Normal heart size. Atherosclerosis in the transverse aorta. No pleural effusion or pneumothorax. Airspace disease in both medial lower lobes. IMPRESSION: Bibasilar airspace disease, suspicious for pneumonia. Aortic Atherosclerosis (ICD10-I70.0). Electronically Signed   By: Abigail Miyamoto M.D.   On: 05/20/2021 14:08   VAS [place] LOWER EXTREMITY VENOUS (DVT)  Result Date: 05/20/2021  Lower Venous DVT Study Patient Name:  SHEREE LALLA  Date of Exam:   05/20/2021 Medical Rec #: [id number]       Accession #:    [id number] Date of Birth: 1945-08-20        Patient Gender: F Patient Age:   70 years Exam Location:  [facility] Procedure:      VAS [place] LOWER EXTREMITY VENOUS (DVT) Referring Phys: [facility] Lakoda Raske --------------------------------------------------------------------------------  Indications: Edema.  Risk Factors: COVID 19 positive. Limitations: Poor ultrasound/tissue interface and patient positioning, patient movement, poor patient cooperation. Comparison Study: No prior studies. Performing Technologist: Oliver Hum RVT  Examination Guidelines: A complete evaluation includes B-mode imaging, spectral Doppler, color Doppler, and power Doppler as needed of all accessible portions of each vessel. Bilateral testing is considered an integral part of a complete examination. Limited examinations for reoccurring indications may be performed as noted. The reflux portion of the exam is performed with the patient in reverse Trendelenburg.  +---------+---------------+---------+-----------+----------+--------------+ RIGHT    CompressibilityPhasicitySpontaneityPropertiesThrombus Aging +---------+---------------+---------+-----------+----------+--------------+ CFV      Full            Yes      Yes                                 +---------+---------------+---------+-----------+----------+--------------+ SFJ      Full                                                        +---------+---------------+---------+-----------+----------+--------------+  FV Prox  Full                                                        +---------+---------------+---------+-----------+----------+--------------+ FV Mid   Full                                                        +---------+---------------+---------+-----------+----------+--------------+ FV Distal               Yes      Yes                                 +---------+---------------+---------+-----------+----------+--------------+ PFV      Full                                                        +---------+---------------+---------+-----------+----------+--------------+ POP      Full           Yes      Yes                                 +---------+---------------+---------+-----------+----------+--------------+ PTV      Full                                                        +---------+---------------+---------+-----------+----------+--------------+ PERO     Full                                                        +---------+---------------+---------+-----------+----------+--------------+   +---------+---------------+---------+-----------+----------+--------------+ LEFT     CompressibilityPhasicitySpontaneityPropertiesThrombus Aging +---------+---------------+---------+-----------+----------+--------------+ CFV      Full           Yes      Yes                                 +---------+---------------+---------+-----------+----------+--------------+ SFJ      Full                                                        +---------+---------------+---------+-----------+----------+--------------+ FV Prox  Full                                                         +---------+---------------+---------+-----------+----------+--------------+  FV Mid   Full                                                        +---------+---------------+---------+-----------+----------+--------------+ FV DistalFull                                                        +---------+---------------+---------+-----------+----------+--------------+ PFV      Full                                                        +---------+---------------+---------+-----------+----------+--------------+ POP      Full           Yes      Yes                                 +---------+---------------+---------+-----------+----------+--------------+ PTV      Full                                                        +---------+---------------+---------+-----------+----------+--------------+ PERO     Full                                                        +---------+---------------+---------+-----------+----------+--------------+     Summary: RIGHT: - There is no evidence of deep vein thrombosis in the lower extremity. However, portions of this examination were limited- see technologist comments above.  - No cystic structure found in the popliteal fossa.  LEFT: - There is no evidence of deep vein thrombosis in the lower extremity. However, portions of this examination were limited- see technologist comments above.  - No cystic structure found in the popliteal fossa.  *See table(s) above for measurements and observations. Electronically signed by Orlie Pollen on 05/20/2021 at 4:58:59 PM.    Final     EKG: Independently reviewed.  No acute ST changes    Assessment/Plan Present on Admission:  Pneumonia due to COVID-19 virus  HYPERCHOLESTEROLEMIA  IIA  HYPERTENSION, MILD  AKI (acute kidney injury) ([place])  Metabolic acidosis, increased anion gap  Acute kidney injury superimposed on CKD (HCC)  CKD (chronic kidney disease) stage 4, GFR 15-29 ml/min (HCC)  Acute  metabolic encephalopathy  Dementia with behavioral disturbance  Principal Problem:   Pneumonia due to COVID-19 virus Active Problems:   HYPERCHOLESTEROLEMIA  IIA   HYPERTENSION, MILD   H/O aortic valve replacement   AKI (acute kidney injury) (HCC)   Metabolic acidosis, increased anion gap   Diabetes mellitus type II, non insulin dependent (HCC)   Acute kidney injury superimposed on CKD (HCC)   CKD (chronic kidney  disease) stage 4, GFR 15-29 ml/min (HCC)   Dementia with behavioral disturbance   Acute metabolic encephalopathy    Sepsis likely 2/2 pneumonia due to COVID-19 infection Currently requiring about 4 L of O2, saturating around 96% On presentation, was tachycardic, tachypneic, with marked leukocytosis Currently afebrile with marked leukocytosis LA 2.2-->4.6, procalcitonin elevated at 2.35, will trend Inflammatory markers, fibrinogen 573, D-dimer 2.77, CRP pending, will trend Chest x-ray with bibasilar airspace disease, suspicious for pneumonia UA/UC pending BC x2 pending Started on remdesivir, IV Decadron, duo nebs Cover empirically with cefepime and vancomycin given sepsis, plan to de-escalate pending clinical status Continue supplemental O2  Acute hypoxic respiratory failure Likely 2/2 above Continue management as above Supplemental O2 as needed, plan to wean off  Type 2 diabetes mellitus with hyperglycemia Possible early DKA Last A1c 7.1 on 08/16/2020, repeat pending Not on any medication prior to arrival Glucose 546, bicarb 19, anion gap 13, beta hydroxybutyric acid 1.09 Start Endotool.insulin drip for severe hyperglycemia as patient currently altered Continue IV fluids, frequent CBG Monitor closely  Acute metabolic encephalopathy Likely multifactorial from COVID-19 infection, severe hyperglycemia Management as above  Hyponatremia/hyperkalemia Likely 2/2 severe hyperglycemia Should improve once CBGs are improving Monitor CMP  AKI on CKD stage IV Likely  2/2 severe hypoglycemia, sepsis, poor oral intake Baseline creatinine around 2.2, on presentation 4.02 Continue IV fluids Strict I's and O's, avoid nephrotoxins Daily CMP  Anemia of chronic kidney disease Hemoglobin around baseline Daily CBC  Elevated D-dimer Likely in the setting of COVID infection Unable to perform CTA chest Lower extremity Doppler negative for DVT May need VQ scan pending clinical status, and if D-dimer is rising  Hypertension BP stable Hold home BP meds for now  Paroxysmal A. Fib Currently in sinus rhythm, slightly tachycardic Not a good candidate for anticoagulation, not on any rate controlling medication  History of CVA Continue with aspirin, statins once able  Dementia with behavioral disturbance and hallucination Currently altered, at baseline only alert and oriented to self Hold PTA Lexapro, Depakote, Seroquel for now until able to tolerate orally or more awake     DVT prophylaxis: Heparin [place]  Code Status: DNR  Family Communication: Discussed with Sister [name], reported patient is a DNR  Disposition Plan: Likely back to SNF  Consults called: Nephrology  Admission status: Inpatient    Alma Friendly MD [facility]   If 7PM-7AM, please contact night-coverage [email or web address]  05/20/2021, 5:23 PM

## 2021-05-20 NOTE — ED Triage Notes (Signed)
Pt BIBA.  Pt from [facility] with c/o fever, covid positive, hyperglycemia and low SPO2. Per EMS staff reports pt was sating 89% on room air and had the patient on a non-rebreather at 3L and reports they don't routinely check blood sugars for a patient that is not a diabetic. Hx of Dementia, DM2, A-fib.   4LNC -96%  HR 78 132/76 CBG - read high.

## 2021-05-21 DIAGNOSIS — N179 Acute kidney failure, unspecified: Secondary | ICD-10-CM | POA: Diagnosis not present

## 2021-05-21 DIAGNOSIS — L8990 Pressure ulcer of unspecified site, unspecified stage: Secondary | ICD-10-CM | POA: Insufficient documentation

## 2021-05-21 DIAGNOSIS — U071 COVID-19: Secondary | ICD-10-CM | POA: Diagnosis not present

## 2021-05-21 DIAGNOSIS — G9341 Metabolic encephalopathy: Secondary | ICD-10-CM | POA: Diagnosis not present

## 2021-05-21 DIAGNOSIS — N184 Chronic kidney disease, stage 4 (severe): Secondary | ICD-10-CM | POA: Diagnosis not present

## 2021-05-21 LAB — CBC WITH DIFFERENTIAL/PLATELET
Abs Immature Granulocytes: 0.19 10*3/uL — ABNORMAL HIGH (ref 0.00–0.07)
Basophils Absolute: 0.1 10*3/uL (ref 0.0–0.1)
Basophils Relative: 0 %
Eosinophils Absolute: 0 10*3/uL (ref 0.0–0.5)
Eosinophils Relative: 0 %
HCT: 30.9 % — ABNORMAL LOW (ref 36.0–46.0)
Hemoglobin: 9.7 g/dL — ABNORMAL LOW (ref 12.0–15.0)
Immature Granulocytes: 1 %
Lymphocytes Relative: 8 %
Lymphs Abs: 1.3 10*3/uL (ref 0.7–4.0)
MCH: 28.4 pg (ref 26.0–34.0)
MCHC: 31.4 g/dL (ref 30.0–36.0)
MCV: 90.4 fL (ref 80.0–100.0)
Monocytes Absolute: 0.8 10*3/uL (ref 0.1–1.0)
Monocytes Relative: 5 %
Neutro Abs: 14.7 10*3/uL — ABNORMAL HIGH (ref 1.7–7.7)
Neutrophils Relative %: 86 %
Platelets: 241 10*3/uL (ref 150–400)
RBC: 3.42 MIL/uL — ABNORMAL LOW (ref 3.87–5.11)
RDW: 13.5 % (ref 11.5–15.5)
WBC Morphology: INCREASED
WBC: 17.1 10*3/uL — ABNORMAL HIGH (ref 4.0–10.5)
nRBC: 0 % (ref 0.0–0.2)

## 2021-05-21 LAB — BASIC METABOLIC PANEL
Anion gap: 14 (ref 5–15)
Anion gap: 12 (ref 5–15)
BUN: 79 mg/dL — ABNORMAL HIGH (ref 8–23)
BUN: 79 mg/dL — ABNORMAL HIGH (ref 8–23)
CO2: 17 mmol/L — ABNORMAL LOW (ref 22–32)
CO2: 18 mmol/L — ABNORMAL LOW (ref 22–32)
Calcium: 8.2 mg/dL — ABNORMAL LOW (ref 8.9–10.3)
Calcium: 8.4 mg/dL — ABNORMAL LOW (ref 8.9–10.3)
Chloride: 106 mmol/L (ref 98–111)
Chloride: 107 mmol/L (ref 98–111)
Creatinine, Ser: 3.68 mg/dL — ABNORMAL HIGH (ref 0.44–1.00)
Creatinine, Ser: 3.59 mg/dL — ABNORMAL HIGH (ref 0.44–1.00)
GFR, Estimated: 12 mL/min — ABNORMAL LOW (ref >60)
GFR, Estimated: 13 mL/min — ABNORMAL LOW (ref >60)
Glucose, Bld: 192 mg/dL — ABNORMAL HIGH (ref 70–99)
Glucose, Bld: 110 mg/dL — ABNORMAL HIGH (ref 70–99)
Potassium: 4.5 mmol/L (ref 3.5–5.1)
Potassium: 4.2 mmol/L (ref 3.5–5.1)
Sodium: 137 mmol/L (ref 135–145)
Sodium: 137 mmol/L (ref 135–145)

## 2021-05-21 LAB — GLUCOSE, CAPILLARY
Glucose-Capillary: 109 mg/dL — ABNORMAL HIGH (ref 70–99)
Glucose-Capillary: 126 mg/dL — ABNORMAL HIGH (ref 70–99)
Glucose-Capillary: 142 mg/dL — ABNORMAL HIGH (ref 70–99)
Glucose-Capillary: 150 mg/dL — ABNORMAL HIGH (ref 70–99)
Glucose-Capillary: 156 mg/dL — ABNORMAL HIGH (ref 70–99)
Glucose-Capillary: 166 mg/dL — ABNORMAL HIGH (ref 70–99)
Glucose-Capillary: 167 mg/dL — ABNORMAL HIGH (ref 70–99)
Glucose-Capillary: 172 mg/dL — ABNORMAL HIGH (ref 70–99)
Glucose-Capillary: 174 mg/dL — ABNORMAL HIGH (ref 70–99)
Glucose-Capillary: 177 mg/dL — ABNORMAL HIGH (ref 70–99)
Glucose-Capillary: 181 mg/dL — ABNORMAL HIGH (ref 70–99)
Glucose-Capillary: 208 mg/dL — ABNORMAL HIGH (ref 70–99)
Glucose-Capillary: 227 mg/dL — ABNORMAL HIGH (ref 70–99)
Glucose-Capillary: 232 mg/dL — ABNORMAL HIGH (ref 70–99)

## 2021-05-21 LAB — C-REACTIVE PROTEIN: CRP: 31.5 mg/dL — ABNORMAL HIGH (ref <1.0)

## 2021-05-21 LAB — MAGNESIUM: Magnesium: 2 mg/dL (ref 1.7–2.4)

## 2021-05-21 LAB — PROCALCITONIN: Procalcitonin: <0.1 ng/mL

## 2021-05-21 LAB — D-DIMER, QUANTITATIVE: D-Dimer, Quant: 3.78 ug/mL-FEU — ABNORMAL HIGH (ref 0.00–0.50)

## 2021-05-21 LAB — LACTIC ACID, PLASMA: Lactic Acid, Venous: 1.7 mmol/L (ref 0.5–1.9)

## 2021-05-21 LAB — MRSA NEXT GEN BY PCR, NASAL: MRSA by PCR Next Gen: NOT DETECTED

## 2021-05-21 LAB — BETA-HYDROXYBUTYRIC ACID: Beta-Hydroxybutyric Acid: 0.09 mmol/L (ref 0.05–0.27)

## 2021-05-21 MED ORDER — IPRATROPIUM-ALBUTEROL 20-100 MCG/ACT IN AERS
2.0000 | INHALATION_SPRAY | Freq: Three times a day (TID) | RESPIRATORY_TRACT | Status: DC
  Administered 2021-05-21 (×2): 2 via RESPIRATORY_TRACT
  Filled 2021-05-21: qty 4

## 2021-05-21 MED ORDER — ASCORBIC ACID 500 MG PO TABS
500.0000 mg | ORAL_TABLET | Freq: Every day | ORAL | Status: DC
  Administered 2021-05-22 – 2021-05-23 (×2): 500 mg via ORAL
  Filled 2021-05-21 (×3): qty 1

## 2021-05-21 MED ORDER — ACETAMINOPHEN 650 MG RE SUPP
650.0000 mg | Freq: Once | RECTAL | Status: AC
  Administered 2021-05-21: 650 mg via RECTAL
  Filled 2021-05-21: qty 1

## 2021-05-21 MED ORDER — ACETAMINOPHEN 325 MG PO TABS: 650.0000 mg | ORAL_TABLET | Freq: Four times a day (QID) | ORAL | Status: DC | PRN

## 2021-05-21 MED ORDER — LIP MEDEX EX OINT: TOPICAL_OINTMENT | CUTANEOUS | Status: DC | PRN

## 2021-05-21 MED ORDER — HEPARIN SODIUM (PORCINE) 5000 UNIT/ML IJ SOLN
5000.0000 [IU] | Freq: Three times a day (TID) | INTRAMUSCULAR | Status: DC
  Administered 2021-05-21 – 2021-05-25 (×13): 5000 [IU] via SUBCUTANEOUS
  Filled 2021-05-21 (×13): qty 1

## 2021-05-21 MED ORDER — ONDANSETRON HCL 4 MG PO TABS: 4.0000 mg | ORAL_TABLET | Freq: Four times a day (QID) | ORAL | Status: DC | PRN

## 2021-05-21 MED ORDER — IPRATROPIUM-ALBUTEROL 20-100 MCG/ACT IN AERS
1.0000 | INHALATION_SPRAY | Freq: Two times a day (BID) | RESPIRATORY_TRACT | Status: DC
  Administered 2021-05-22 – 2021-05-24 (×5): 1 via RESPIRATORY_TRACT
  Filled 2021-05-21: qty 4

## 2021-05-21 MED ORDER — IPRATROPIUM-ALBUTEROL 20-100 MCG/ACT IN AERS
1.0000 | INHALATION_SPRAY | Freq: Four times a day (QID) | RESPIRATORY_TRACT | Status: DC | PRN
  Filled 2021-05-21: qty 4

## 2021-05-21 MED ORDER — IPRATROPIUM-ALBUTEROL 20-100 MCG/ACT IN AERS: 1.0000 | INHALATION_SPRAY | Freq: Four times a day (QID) | RESPIRATORY_TRACT | Status: DC

## 2021-05-21 MED ORDER — ONDANSETRON HCL 4 MG/2ML IJ SOLN: 4.0000 mg | Freq: Four times a day (QID) | INTRAMUSCULAR | Status: DC | PRN

## 2021-05-21 MED ORDER — SENNOSIDES-DOCUSATE SODIUM 8.6-50 MG PO TABS: 1.0000 | ORAL_TABLET | Freq: Every evening | ORAL | Status: DC | PRN

## 2021-05-21 MED ORDER — LACTATED RINGERS IV BOLUS
250.0000 mL | Freq: Once | INTRAVENOUS | Status: AC
  Administered 2021-05-21: 250 mL via INTRAVENOUS

## 2021-05-21 MED ORDER — METHYLPREDNISOLONE SODIUM SUCC 125 MG IJ SOLR
50.0000 mg | Freq: Two times a day (BID) | INTRAMUSCULAR | Status: AC
  Administered 2021-05-21 – 2021-05-23 (×6): 50 mg via INTRAVENOUS
  Filled 2021-05-21 (×6): qty 2

## 2021-05-21 MED ORDER — INSULIN ASPART 100 UNIT/ML IJ SOLN
0.0000 [IU] | INTRAMUSCULAR | Status: DC
  Administered 2021-05-21: 2 [IU] via SUBCUTANEOUS
  Administered 2021-05-21 – 2021-05-22 (×3): 3 [IU] via SUBCUTANEOUS
  Administered 2021-05-22: 5 [IU] via SUBCUTANEOUS
  Administered 2021-05-22: 3 [IU] via SUBCUTANEOUS
  Administered 2021-05-22: 5 [IU] via SUBCUTANEOUS
  Administered 2021-05-22 (×3): 3 [IU] via SUBCUTANEOUS
  Administered 2021-05-23 (×2): 2 [IU] via SUBCUTANEOUS
  Administered 2021-05-23 (×3): 3 [IU] via SUBCUTANEOUS
  Administered 2021-05-24: 1 [IU] via SUBCUTANEOUS
  Administered 2021-05-24: 2 [IU] via SUBCUTANEOUS
  Administered 2021-05-24: 1 [IU] via SUBCUTANEOUS
  Administered 2021-05-24: 3 [IU] via SUBCUTANEOUS

## 2021-05-21 MED ORDER — ZINC SULFATE 220 (50 ZN) MG PO CAPS
220.0000 mg | ORAL_CAPSULE | Freq: Every day | ORAL | Status: DC
  Administered 2021-05-22 – 2021-05-23 (×2): 220 mg via ORAL
  Filled 2021-05-21 (×3): qty 1

## 2021-05-21 MED ORDER — GUAIFENESIN-DM 100-10 MG/5ML PO SYRP: 10.0000 mL | ORAL_SOLUTION | ORAL | Status: DC | PRN

## 2021-05-21 MED ORDER — PREDNISONE 50 MG PO TABS: 50.0000 mg | ORAL_TABLET | Freq: Every day | ORAL | Status: DC

## 2021-05-21 MED ORDER — INSULIN GLARGINE-YFGN 100 UNIT/ML ~~LOC~~ SOLN
10.0000 [IU] | SUBCUTANEOUS | Status: DC
  Administered 2021-05-21 – 2021-05-22 (×2): 10 [IU] via SUBCUTANEOUS
  Filled 2021-05-21 (×3): qty 0.1

## 2021-05-21 MED ORDER — SODIUM CHLORIDE 0.9 % IV SOLN: INTRAVENOUS | Status: DC

## 2021-05-21 NOTE — Progress Notes (Signed)
PROGRESS NOTE  Sandra Sheppard ENI:[id number] DOB: [birth year] DOA: 05/20/2021 PCP: Prince Solian, MD  HPI/Recap of past 24 hours: Sandra Sheppard is a 75 y.o. female with medical history significant for hypertension, hyperlipidemia, CKD stage IV, diabetes mellitus type 2, aortic valve replacement, dementia, presenting from [facility] with complaints of hypoxia, fever, COVID-positive, and hyperglycemia.  Patient unable to give any history due to acute metabolic encephalopathy.  Per EMS staff, patient was reported to be saturating around 89% on room air and was placed on 4 L of O2.  In the ED, patient noted to be afebrile, tachycardic and heart rate in the 90s, BP stable, saturating around 96% on 4 L of O2.  Labs showed marked leukocytosis 19.6, LA 2.2-->4.6, glucose 546, K+ 5.5, NA 128, creatinine 4.02, above baseline, procalcitonin 2.35, COVID-19 positive, chest x-ray showed bibasilar airspace disease suspicious for pneumonia.  Patient was started on remdesivir, broad-spectrum antibiotics, IV fluids and 5 units of insulin.  At baseline, oriented to self, but as per sister has been going downhill. Nephrology was consulted by EDP.  Patient admitted for further management.     Today, pt still somnolent, arouses only to sternal rub, lethargic. Unable to perform ROS or have any meaningful conversation    Assessment/Plan: Principal Problem:   Pneumonia due to COVID-19 virus Active Problems:   HYPERCHOLESTEROLEMIA  IIA   HYPERTENSION, MILD   H/O aortic valve replacement   AKI (acute kidney injury) (HCC)   Metabolic acidosis, increased anion gap   Diabetes mellitus type II, non insulin dependent (HCC)   Acute kidney injury superimposed on CKD (HCC)   CKD (chronic kidney disease) stage 4, GFR 15-29 ml/min (HCC)   Dementia with behavioral disturbance   Acute metabolic encephalopathy   Pressure injury of skin   Sepsis likely 2/2 pneumonia due to COVID-19 infection Vs UTI On admission,  required around 4L of O2, currently on RA satting around 94% On presentation, was tachycardic, tachypneic, with marked leukocytosis Currently afebrile with marked leukocytosis LA 2.2-->4.6-->1.7, procalcitonin elevated at 2.35--> <0.10 Inflammatory markers, fibrinogen 573, D-dimer 2.77, CRP elevated 17-->31, will trend Chest x-ray with bibasilar airspace disease, suspicious for pneumonia UC growing E.coli BC x2 NGTD Continue Remdesivir, IV solumedrol, inhaler No need for Actemra as patient on RA now, elevated CRP might be from UTI Continue cefepime, d/c vancomycin (MRSA screen neg) Continue supplemental O2  E.coli UTI UA positive for infection, UC growing E. Coli Continue cefepime as above, pending final culture   Acute hypoxic respiratory failure Likely 2/2  Continue management as above Supplemental O2 as needed   Type 2 diabetes mellitus with hyperglycemia Possible early DKA A1c 9.9 Not on any medication prior to arrival Glucose 546, bicarb 19, anion gap 13, beta hydroxybutyric acid 1.09 S/p Endotool insulin drip for severe hyperglycemia as patient altered and npo Switched to SSI Q4H, glargine, until able to tolerate orally Continue IV fluids Monitor closely   Acute metabolic encephalopathy Likely multifactorial from COVID-19 infection, severe hyperglycemia, UTI Management as above   Hyponatremia/hyperkalemia Improved Likely 2/2 severe hyperglycemia Daily CMP   AKI on CKD stage IV Likely 2/2 severe hyperglycemia, sepsis, poor oral intake, marked dehydration Baseline creatinine around 2.2, on presentation 4.02 Continue IV fluids Strict I's and O's, avoid nephrotoxins Family not interested in dialysis if renal fxn significantly declines Daily CMP   Anemia of chronic kidney disease Hemoglobin with drop likely 2/2 hemodilution Daily CBC   Elevated D-dimer Likely in the setting of COVID infection Unable  to perform CTA chest due to CKD Lower extremity Doppler  negative for DVT May need VQ scan pending clinical status, and if D-dimer is rising significantly   Hypertension BP soft Hold home BP meds for now   Paroxysmal A. Fib Currently in sinus rhythm, rate controlled Not a good candidate for anticoagulation as per family, hx of multiple falls Not on any rate controlling medication   History of CVA Continue with aspirin, statins once able   Dementia with behavioral disturbance and hallucination Currently altered, at baseline only oriented to self Hold PTA Lexapro, Depakote, Seroquel for now until able to tolerate orally or more awake  [place] Patient with very poor prognosis, currently DNR Palliative consulted      Estimated body mass index is 18.6 kg/m as calculated from the following:   Height as of this encounter: 5\' 5"  (1.651 m).   Weight as of this encounter: 50.7 kg.     Code Status: DNR  Family Communication: Discussed with sister on 05/20/2021  Disposition Plan: Status is: Inpatient  Remains inpatient appropriate because: Level of care   Consultants: Nephrology  Procedures: None  Antimicrobials: Cefepime  DVT prophylaxis: Heparin [place]   Objective: Vitals:   05/21/21 1200 05/21/21 1300 05/21/21 1400 05/21/21 1500  BP: (!) 117/39 (!) 129/44 (!) 107/42 (!) 119/41  Pulse: 86 78 75 70  Resp: 19 (!) 21 (!) 23 (!) 21  Temp:      TempSrc:      SpO2: 95% 93%  93%  Weight:      Height:        Intake/Output Summary (Last 24 hours) at 05/21/2021 1607 Last data filed at 05/21/2021 1500 Gross per 24 hour  Intake 2330.22 ml  Output 300 ml  Net 2030.22 ml   Filed Weights   05/20/21 1326 05/21/21 0527  Weight: 53.5 kg 50.7 kg    Exam: General: Somnolent, difficult to arouse, only to sternal rub, unable to have any meaningful conversation Cardiovascular: S1, S2 present Respiratory: CTAB Abdomen: Soft, nontender, nondistended, bowel sounds present Musculoskeletal: No bilateral pedal edema noted Skin:  Normal Psychiatry: Unable to assess    Data Reviewed: CBC: Recent Labs  Lab 05/20/21 1328 05/20/21 1424 05/21/21 0843  WBC 19.6*  --  17.1*  NEUTROABS 18.0*  --  14.7*  HGB 10.7* 11.2* 9.7*  HCT 32.9* 33.0* 30.9*  MCV 88.9  --  90.4  PLT 292  --  756   Basic Metabolic Panel: Recent Labs  Lab 05/20/21 1328 05/20/21 1424 05/20/21 2155 05/21/21 0242 05/21/21 0843  NA 128* 128* 135 137 137  K 5.5* 5.4* 4.8 4.5 4.2  CL 96* 99 106 106 107  CO2 19*  --  16* 17* 18*  GLUCOSE 546* 548* 172* 192* 110*  BUN 85* 97* 75* 79* 79*  CREATININE 4.02* 4.40* 3.71* 3.68* 3.59*  CALCIUM 8.8*  --  8.2* 8.2* 8.4*  MG  --   --   --   --  2.0   GFR: Estimated Creatinine Clearance: 10.7 mL/min (A) (by C-G formula based on SCr of 3.59 mg/dL (H)). Liver Function Tests: Recent Labs  Lab 05/20/21 1328  AST 14*  ALT 10  ALKPHOS 56  BILITOT 0.5  PROT 8.4*  ALBUMIN 3.6   No results for input(s): LIPASE, AMYLASE in the last 168 hours. No results for input(s): AMMONIA in the last 168 hours. Coagulation Profile: No results for input(s): INR, PROTIME in the last 168 hours. Cardiac Enzymes: No results for  input(s): CKTOTAL, CKMB, CKMBINDEX, TROPONINI in the last 168 hours. BNP (last 3 results) No results for input(s): PROBNP in the last 8760 hours. HbA1C: Recent Labs    05/20/21 1328  HGBA1C 9.9*   CBG: Recent Labs  Lab 05/21/21 0931 05/21/21 1038 05/21/21 1141 05/21/21 1233 05/21/21 1347  GLUCAP 109* 156* 166* 167* 172*   Lipid Profile: Recent Labs    05/20/21 1328  TRIG 401*   Thyroid Function Tests: No results for input(s): TSH, T4TOTAL, FREET4, T3FREE, THYROIDAB in the last 72 hours. Anemia Panel: Recent Labs    05/20/21 1328  FERRITIN 307   Urine analysis:    Component Value Date/Time   COLORURINE YELLOW 05/20/2021 1537   APPEARANCEUR CLOUDY (A) 05/20/2021 1537   LABSPEC 1.025 05/20/2021 1537   PHURINE 5.5 05/20/2021 1537   GLUCOSEU >=1000 (A) 05/20/2021  1537   HGBUR MODERATE (A) 05/20/2021 1537   BILIRUBINUR NEGATIVE 05/20/2021 1537   KETONESUR TRACE (A) 05/20/2021 1537   PROTEINUR 30 (A) 05/20/2021 1537   UROBILINOGEN 0.2 01/14/2010 1308   NITRITE NEGATIVE 05/20/2021 1537   LEUKOCYTESUR SMALL (A) 05/20/2021 1537   Sepsis Labs: @LABRCNTIP (procalcitonin:4,lacticidven:4)  ) Recent Results (from the past 240 hour(s))  Resp Panel by RT-PCR (Flu A&B, Covid) Nasopharyngeal Swab     Status: Abnormal   Collection Time: 05/20/21  1:15 PM   Specimen: Nasopharyngeal Swab; Nasopharyngeal(NP) swabs in vial transport medium  Result Value Ref Range Status   SARS Coronavirus 2 by RT PCR POSITIVE (A) NEGATIVE Final    Comment: RESULT CALLED TO, READ BACK BY AND VERIFIED WITH: BRATU,D. EMTP AT 1434 05/20/21 MULLINS,T (NOTE) SARS-CoV-2 target nucleic acids are DETECTED.  The SARS-CoV-2 RNA is generally detectable in upper respiratory specimens during the acute phase of infection. Positive results are indicative of the presence of the identified virus, but do not rule out bacterial infection or co-infection with other pathogens not detected by the test. Clinical correlation with patient history and other diagnostic information is necessary to determine patient infection status. The expected result is Negative.  Fact Sheet for Patients: EntrepreneurPulse.com.au  Fact Sheet for Healthcare Providers: IncredibleEmployment.be  This test is not yet approved or cleared by the [place] FDA and  has been authorized for detection and/or diagnosis of SARS-CoV-2 by FDA under an Emergency Use Authorization (EUA).  This EUA will remain in effect (meaning this test  can be used) for the duration of  the COVID-19 declaration under Section 564(b)(1) of the Act, 21 U.S.C. section 360bbb-3(b)(1), unless the authorization is terminated or revoked sooner.     Influenza A by PCR NEGATIVE NEGATIVE Final   Influenza B by  PCR NEGATIVE NEGATIVE Final    Comment: (NOTE) The Xpert Xpress SARS-CoV-2/FLU/RSV plus assay is intended as an aid in the diagnosis of influenza from Nasopharyngeal swab specimens and should not be used as a sole basis for treatment. Nasal washings and aspirates are unacceptable for Xpert Xpress SARS-CoV-2/FLU/RSV testing.  Fact Sheet for Patients: EntrepreneurPulse.com.au  Fact Sheet for Healthcare Providers: IncredibleEmployment.be  This test is not yet approved or cleared by the [place] FDA and has been authorized for detection and/or diagnosis of SARS-CoV-2 by FDA under an Emergency Use Authorization (EUA). This EUA will remain in effect (meaning this test can be used) for the duration of the COVID-19 declaration under Section 564(b)(1) of the Act, 21 U.S.C. section 360bbb-3(b)(1), unless the authorization is terminated or revoked.  Performed at [facility], [address] [place], [place]  [address]   Blood Culture (routine x 2)     Status: None (Preliminary result)   Collection Time: 05/20/21  1:15 PM   Specimen: BLOOD  Result Value Ref Range Status   Specimen Description   Final    BLOOD BLOOD LEFT HAND Performed at [facility] [address] [place], [place] [address]    Special Requests   Final    BOTTLES DRAWN AEROBIC AND ANAEROBIC Blood Culture adequate volume Performed at [facility] [address], [place], [place] [address]    Culture   Final    NO GROWTH < 24 HOURS Performed at [facility] [address], [place], [place] [address]    Report Status PENDING  Incomplete  Blood Culture (routine x 2)     Status: None (Preliminary result)   Collection Time: 05/20/21  1:28 PM   Specimen: BLOOD  Result Value Ref Range Status   Specimen Description   Final    BLOOD BLOOD RIGHT HAND Performed at [facility] [address]  [place], [place] [address]    Special Requests   Final    BOTTLES DRAWN AEROBIC AND ANAEROBIC Blood Culture adequate volume Performed at [facility] [address] [place], [place] [address]    Culture   Final    NO GROWTH < 24 HOURS Performed at [facility] [address], [place], [place] [address]    Report Status PENDING  Incomplete  Urine Culture     Status: Abnormal (Preliminary result)   Collection Time: 05/20/21  3:39 PM   Specimen: In/Out Cath Urine  Result Value Ref Range Status   Specimen Description   Final    IN/OUT CATH URINE Performed at [facility] [address], [place], [place] [address]    Special Requests   Final    NONE Performed at [facility], [place] [address], [place], [place] [address]    Culture (A)  Final    >=100,000 COLONIES/mL ESCHERICHIA COLI CULTURE REINCUBATED FOR BETTER GROWTH SUSCEPTIBILITIES TO FOLLOW Performed at [facility], [place] [address], [place], [place] [address]    Report Status PENDING  Incomplete  MRSA Next Gen by PCR, Nasal     Status: None   Collection Time: 05/21/21 12:30 AM   Specimen: Nasal Mucosa; Nasal Swab  Result Value Ref Range Status   MRSA by PCR Next Gen NOT DETECTED NOT DETECTED Final    Comment: (NOTE) The GeneXpert MRSA Assay (FDA approved for NASAL specimens only), is one component of a comprehensive MRSA colonization surveillance program. It is not intended to diagnose MRSA infection nor to guide or monitor treatment for MRSA infections. Test performance is not FDA approved in patients less than 58 years old. Performed at [facility], [place] [address] [place],  [address]       Studies: US RENAL  Result Date: 05/20/2021 CLINICAL DATA:  Acute kidney injury. EXAM: RENAL / URINARY TRACT ULTRASOUND COMPLETE COMPARISON:  08/08/2018 FINDINGS: Right Kidney: Renal measurements: 9.5 by 3.7 by 4.7 cm = volume: 86 mL.  Echogenicity within normal limits. No solid mass or hydronephrosis visualized. Two right renal cysts are present, 11.1 cm in diameter in the other 1.0 cm in diameter. Left Kidney: Renal measurements: 8.9 by 4.6 by 3.3 cm = volume: 70 mL. Echogenicity within normal limits. No solid mass or hydronephrosis visualized. A left renal cyst measures 1.6 by  1.4 by 1.5 cm. Bladder: Irregularity posteriorly in the urinary bladder nonspecific but could be secondary to nondistention. Other: Cholelithiasis noted. IMPRESSION: 1. Relatively small kidneys favoring renal atrophy. 2. Small bilateral renal cysts. 3. Irregularity posteriorly in the urinary bladder is nonspecific but possibly secondary to nondistention. 4. Cholelithiasis. Electronically Signed   By: Van Clines M.D.   On: 05/20/2021 20:36   VAS [place] LOWER EXTREMITY VENOUS (DVT)  Result Date: 05/20/2021  Lower Venous DVT Study Patient Name:  SANSKRITI GREENLAW  Date of Exam:   05/20/2021 Medical Rec #: [id number]       Accession #:    [id number] Date of Birth: August 19, 1945        Patient Gender: F Patient Age:   78 years Exam Location:  [facility] Procedure:      VAS [place] LOWER EXTREMITY VENOUS (DVT) Referring Phys: [facility] Keeara Frees --------------------------------------------------------------------------------  Indications: Edema.  Risk Factors: COVID 19 positive. Limitations: Poor ultrasound/tissue interface and patient positioning, patient movement, poor patient cooperation. Comparison Study: No prior studies. Performing Technologist: Oliver Hum RVT  Examination Guidelines: A complete evaluation includes B-mode imaging, spectral Doppler, color Doppler, and power Doppler as needed of all accessible portions of each vessel. Bilateral testing is considered an integral part of a complete examination. Limited examinations for reoccurring indications may be performed as noted. The reflux portion of the exam is performed with the patient in reverse  Trendelenburg.  +---------+---------------+---------+-----------+----------+--------------+ RIGHT    CompressibilityPhasicitySpontaneityPropertiesThrombus Aging +---------+---------------+---------+-----------+----------+--------------+ CFV      Full           Yes      Yes                                 +---------+---------------+---------+-----------+----------+--------------+ SFJ      Full                                                        +---------+---------------+---------+-----------+----------+--------------+ FV Prox  Full                                                        +---------+---------------+---------+-----------+----------+--------------+ FV Mid   Full                                                        +---------+---------------+---------+-----------+----------+--------------+ FV Distal               Yes      Yes                                 +---------+---------------+---------+-----------+----------+--------------+ PFV      Full                                                        +---------+---------------+---------+-----------+----------+--------------+  POP      Full           Yes      Yes                                 +---------+---------------+---------+-----------+----------+--------------+ PTV      Full                                                        +---------+---------------+---------+-----------+----------+--------------+ PERO     Full                                                        +---------+---------------+---------+-----------+----------+--------------+   +---------+---------------+---------+-----------+----------+--------------+ LEFT     CompressibilityPhasicitySpontaneityPropertiesThrombus Aging +---------+---------------+---------+-----------+----------+--------------+ CFV      Full           Yes      Yes                                  +---------+---------------+---------+-----------+----------+--------------+ SFJ      Full                                                        +---------+---------------+---------+-----------+----------+--------------+ FV Prox  Full                                                        +---------+---------------+---------+-----------+----------+--------------+ FV Mid   Full                                                        +---------+---------------+---------+-----------+----------+--------------+ FV DistalFull                                                        +---------+---------------+---------+-----------+----------+--------------+ PFV      Full                                                        +---------+---------------+---------+-----------+----------+--------------+ POP      Full           Yes      Yes                                 +---------+---------------+---------+-----------+----------+--------------+  PTV      Full                                                        +---------+---------------+---------+-----------+----------+--------------+ PERO     Full                                                        +---------+---------------+---------+-----------+----------+--------------+     Summary: RIGHT: - There is no evidence of deep vein thrombosis in the lower extremity. However, portions of this examination were limited- see technologist comments above.  - No cystic structure found in the popliteal fossa.  LEFT: - There is no evidence of deep vein thrombosis in the lower extremity. However, portions of this examination were limited- see technologist comments above.  - No cystic structure found in the popliteal fossa.  *See table(s) above for measurements and observations. Electronically signed by Orlie Pollen on 05/20/2021 at 4:58:59 PM.    Final     Scheduled Meds:  vitamin C  500 mg Oral Daily   Chlorhexidine Gluconate  Cloth  6 each Topical Daily   heparin  5,000 Units Subcutaneous Q8H   insulin aspart  0-9 Units Subcutaneous Q4H   insulin glargine-yfgn  10 Units Subcutaneous Q24H   Ipratropium-Albuterol  2 puff Inhalation TID   mouth rinse  15 mL Mouth Rinse BID   methylPREDNISolone (SOLU-MEDROL) injection  50 mg Intravenous Q12H   [START ON 05/24/2021] predniSONE  50 mg Oral Q breakfast   zinc sulfate  220 mg Oral Daily    Continuous Infusions:  sodium chloride 10 mL/hr at 05/21/21 1143   ceFEPime (MAXIPIME) IV 1 g (05/21/21 1519)   dextrose 5% lactated ringers 100 mL/hr at 05/21/21 1500   insulin Stopped (05/21/21 1350)   lactated ringers Stopped (05/20/21 2017)   remdesivir 100 mg in NS 100 mL Stopped (05/21/21 1322)     LOS: 1 day     Alma Friendly, MD [facility]  If 7PM-7AM, please contact night-coverage [email or web address] 05/21/2021, 4:07 PM

## 2021-05-21 NOTE — Progress Notes (Signed)
Admit: 05/20/2021 LOS: 1  9F COVID19 PNA, shock, AoCKD4, DKA  Subjective:  SNa improved in past 24h, now 3.6, SNA normalized as cbg improved Renal US with some atrophy present, no obstruction or HN K normalized Remains on D5LR @ 175mL/h Only uop recored is 0.1L but RN states sig leaking around purewick She remains somnolent, grunts to touch  12/08 0701 - 12/09 0700 In: 1141.6 [I.V.:389.1; IV Piggyback:752.5] Out: -   Filed Weights   05/20/21 1326 05/21/21 0527  Weight: 53.5 kg 50.7 kg    Scheduled Meds:  vitamin C  500 mg Oral Daily   Chlorhexidine Gluconate Cloth  6 each Topical Daily   heparin  5,000 Units Subcutaneous Q8H   insulin aspart  0-9 Units Subcutaneous Q4H   insulin glargine-yfgn  10 Units Subcutaneous Q24H   Ipratropium-Albuterol  2 puff Inhalation TID   mouth rinse  15 mL Mouth Rinse BID   methylPREDNISolone (SOLU-MEDROL) injection  50 mg Intravenous Q12H   [START ON 05/24/2021] predniSONE  50 mg Oral Q breakfast   zinc sulfate  220 mg Oral Daily   Continuous Infusions:  sodium chloride 10 mL/hr at 05/21/21 1143   ceFEPime (MAXIPIME) IV     dextrose 5% lactated ringers 100 mL/hr at 05/21/21 1300   insulin 1.7 Units/hr (05/21/21 1300)   lactated ringers Stopped (05/20/21 2017)   remdesivir 100 mg in NS 100 mL Stopped (05/21/21 1322)   PRN Meds:.acetaminophen, dextrose, guaiFENesin-dextromethorphan, Ipratropium-Albuterol, lip balm, ondansetron **OR** ondansetron (ZOFRAN) IV, senna-docusate  Current Labs: reviewed   Physical Exam:  Blood pressure (!) 129/44, pulse 78, temperature 98.9 F (37.2 C), temperature source Axillary, resp. rate (!) 21, height 5\' 5"  (1.651 m), weight 50.7 kg, SpO2 93 %. Thin elderly female, somnolent Regular, nl s1s2 S/nt Coarse b/l nl wob No LEE Sarcopenic No rashes  A **Acute hypoxic respiratory failure: secondary to COVID PNA poss bacterial superinfection. Being treated with remdesivir and broad spectrum antibiotics.      **Shock, presumably septic r/o hypovolemic: resuscitation and vasopressor support PRN.  UCx + E. Coli Sn pending on cefepime, BCx NGTD   **AKI on CKD 4:  Baseline Cr in low to mid 2s, improving with hydration and supportie care in past 24h.  UA consistent with infection. Renal US w/o obstructionl  Follow I/Os, daily labs.  Avoid hypotension and nephrotoxins.     **DKA:  resolved   **Hyponatremia:  resolved   **Anemia: mild, no intervention needed at this time.    **Hyperkalemia:  resolved   **Dementia, acute encephalopathy   **Hypertension: normotensive to hypertensive.  On amlodipine at baseline, would hold for now to ensure hemodynamic stability to avoid hypotension.    Will follow, page with questions.    Pearson Grippe MD 05/21/2021, 2:12 PM  Recent Labs  Lab 05/20/21 2155 05/21/21 0242 05/21/21 0843  NA 135 137 137  K 4.8 4.5 4.2  CL 106 106 107  CO2 16* 17* 18*  GLUCOSE 172* 192* 110*  BUN 75* 79* 79*  CREATININE 3.71* 3.68* 3.59*  CALCIUM 8.2* 8.2* 8.4*   Recent Labs  Lab 05/20/21 1328 05/20/21 1424 05/21/21 0843  WBC 19.6*  --  17.1*  NEUTROABS 18.0*  --  14.7*  HGB 10.7* 11.2* 9.7*  HCT 32.9* 33.0* 30.9*  MCV 88.9  --  90.4  PLT 292  --  241

## 2021-05-21 NOTE — Plan of Care (Signed)
  Problem: Clinical Measurements: Goal: Ability to maintain clinical measurements within normal limits will improve Outcome: Not Progressing Goal: Will remain free from infection Outcome: Not Progressing Goal: Diagnostic test results will improve Outcome: Not Progressing   Problem: Nutrition: Goal: Adequate nutrition will be maintained Outcome: Not Progressing   Problem: Elimination: Goal: Will not experience complications related to urinary retention Outcome: Not Progressing   Problem: Skin Integrity: Goal: Risk for impaired skin integrity will decrease Outcome: Not Progressing

## 2021-05-21 NOTE — Progress Notes (Signed)
Inpatient Diabetes Program Recommendations  AACE/ADA: New Consensus Statement on Inpatient Glycemic Control (2015)  Target Ranges:  Prepandial:   less than 140 mg/dL      Peak postprandial:   less than 180 mg/dL (1-2 hours)      Critically ill patients:  140 - 180 mg/dL   Lab Results  Component Value Date   GLUCAP 126 (H) 05/21/2021   HGBA1C 9.9 (H) 05/20/2021    Review of Glycemic Control  Diabetes history: type 2 Outpatient Diabetes medications: none Current orders for Inpatient glycemic control: IV insulin drip  Inpatient Diabetes Program Recommendations:   Noted that patient does not take any DM medications at home. Noted that hgbA1C is 9.9%.   When patient is ready to transition off IV insulin drip, recommend Semglee 10 units daily (to be given 2 hours prior to stopping drip),  Novolog SENSITIVE correction scale every 4 hours until eating, then TID and 0-5 units HS scale.  Patient is on steroids. Titrate dosages as needed.   Will continue to monitor blood sugars while in the hospital.  Harvel Ricks RN BSN CDE Diabetes Coordinator Pager: [phone]  8am-5pm

## 2021-05-22 DIAGNOSIS — J1282 Pneumonia due to coronavirus disease 2019: Secondary | ICD-10-CM | POA: Diagnosis not present

## 2021-05-22 DIAGNOSIS — U071 COVID-19: Secondary | ICD-10-CM | POA: Diagnosis not present

## 2021-05-22 LAB — GLUCOSE, CAPILLARY
Comment 1: 0
Glucose-Capillary: 226 mg/dL — ABNORMAL HIGH (ref 70–99)
Glucose-Capillary: 245 mg/dL — ABNORMAL HIGH (ref 70–99)
Glucose-Capillary: 247 mg/dL — ABNORMAL HIGH (ref 70–99)
Glucose-Capillary: 250 mg/dL — ABNORMAL HIGH (ref 70–99)
Glucose-Capillary: 266 mg/dL — ABNORMAL HIGH (ref 70–99)
Glucose-Capillary: 276 mg/dL — ABNORMAL HIGH (ref 70–99)

## 2021-05-22 LAB — CBC WITH DIFFERENTIAL/PLATELET
Abs Immature Granulocytes: 0.38 10*3/uL — ABNORMAL HIGH (ref 0.00–0.07)
Basophils Absolute: 0 10*3/uL (ref 0.0–0.1)
Basophils Relative: 0 %
Eosinophils Absolute: 0 10*3/uL (ref 0.0–0.5)
Eosinophils Relative: 0 %
HCT: 28.4 % — ABNORMAL LOW (ref 36.0–46.0)
Hemoglobin: 9.4 g/dL — ABNORMAL LOW (ref 12.0–15.0)
Immature Granulocytes: 2 %
Lymphocytes Relative: 4 %
Lymphs Abs: 0.7 10*3/uL (ref 0.7–4.0)
MCH: 29 pg (ref 26.0–34.0)
MCHC: 33.1 g/dL (ref 30.0–36.0)
MCV: 87.7 fL (ref 80.0–100.0)
Monocytes Absolute: 0.5 10*3/uL (ref 0.1–1.0)
Monocytes Relative: 3 %
Neutro Abs: 17.1 10*3/uL — ABNORMAL HIGH (ref 1.7–7.7)
Neutrophils Relative %: 91 %
Platelets: 223 10*3/uL (ref 150–400)
RBC: 3.24 MIL/uL — ABNORMAL LOW (ref 3.87–5.11)
RDW: 13.6 % (ref 11.5–15.5)
WBC: 18.7 10*3/uL — ABNORMAL HIGH (ref 4.0–10.5)
nRBC: 0 % (ref 0.0–0.2)

## 2021-05-22 LAB — COMPREHENSIVE METABOLIC PANEL
ALT: 10 U/L (ref 0–44)
AST: 17 U/L (ref 15–41)
Albumin: 2.6 g/dL — ABNORMAL LOW (ref 3.5–5.0)
Alkaline Phosphatase: 47 U/L (ref 38–126)
Anion gap: 10 (ref 5–15)
BUN: 69 mg/dL — ABNORMAL HIGH (ref 8–23)
CO2: 19 mmol/L — ABNORMAL LOW (ref 22–32)
Calcium: 8.4 mg/dL — ABNORMAL LOW (ref 8.9–10.3)
Chloride: 109 mmol/L (ref 98–111)
Creatinine, Ser: 3.23 mg/dL — ABNORMAL HIGH (ref 0.44–1.00)
GFR, Estimated: 14 mL/min — ABNORMAL LOW (ref >60)
Glucose, Bld: 270 mg/dL — ABNORMAL HIGH (ref 70–99)
Potassium: 4.9 mmol/L (ref 3.5–5.1)
Sodium: 138 mmol/L (ref 135–145)
Total Bilirubin: 0.6 mg/dL (ref 0.3–1.2)
Total Protein: 6.9 g/dL (ref 6.5–8.1)

## 2021-05-22 LAB — URINE CULTURE: Culture: >=100000 — AB

## 2021-05-22 LAB — C-REACTIVE PROTEIN: CRP: 28.3 mg/dL — ABNORMAL HIGH (ref <1.0)

## 2021-05-22 LAB — D-DIMER, QUANTITATIVE: D-Dimer, Quant: 3.03 ug/mL-FEU — ABNORMAL HIGH (ref 0.00–0.50)

## 2021-05-22 LAB — FERRITIN: Ferritin: 357 ng/mL — ABNORMAL HIGH (ref 11–307)

## 2021-05-22 MED ORDER — QUETIAPINE FUMARATE 25 MG PO TABS
25.0000 mg | ORAL_TABLET | Freq: Every day | ORAL | Status: DC
  Administered 2021-05-22 – 2021-05-23 (×2): 25 mg via ORAL
  Filled 2021-05-22 (×3): qty 1

## 2021-05-22 MED ORDER — SODIUM CHLORIDE 0.9 % IV SOLN
2.0000 g | INTRAVENOUS | Status: DC
  Administered 2021-05-22: 2 g via INTRAVENOUS
  Filled 2021-05-22: qty 2

## 2021-05-22 MED ORDER — AMLODIPINE BESYLATE 10 MG PO TABS
10.0000 mg | ORAL_TABLET | Freq: Every day | ORAL | Status: DC
  Administered 2021-05-22 – 2021-05-23 (×2): 10 mg via ORAL
  Filled 2021-05-22 (×3): qty 1

## 2021-05-22 MED ORDER — METOPROLOL TARTRATE 5 MG/5ML IV SOLN
2.5000 mg | Freq: Four times a day (QID) | INTRAVENOUS | Status: DC | PRN
  Administered 2021-05-22: 2.5 mg via INTRAVENOUS
  Filled 2021-05-22: qty 5

## 2021-05-22 NOTE — Progress Notes (Signed)
Inpatient Diabetes Program Recommendations  AACE/ADA: New Consensus Statement on Inpatient Glycemic Control (2015)  Target Ranges:  Prepandial:   less than 140 mg/dL      Peak postprandial:   less than 180 mg/dL (1-2 hours)      Critically ill patients:  140 - 180 mg/dL   Lab Results  Component Value Date   GLUCAP 245 (H) 05/22/2021   HGBA1C 9.9 (H) 05/20/2021    Review of Glycemic Control  Diabetes history: new onset Outpatient Diabetes medications: none Current orders for Inpatient glycemic control: Semglee 10 units daily, Novolog SENSITIVE correction scale every 4 hours  Inpatient Diabetes Program Recommendations:   Noted that blood sugars continue to be greater than 200 mg/dl. Recommend increasing Semglee to 10 units BID especially while patient on steroids.   Will continue to monitor blood sugars while in the hospital.  Harvel Ricks RN BSN CDE Diabetes Coordinator Pager: [phone]  8am-5pm

## 2021-05-22 NOTE — Progress Notes (Signed)
Admit: 05/20/2021 LOS: 2  85F COVID19 PNA, shock, AoCKD4, DKA  Subjective:  SCr improved to 3.2, UOP picking up Pt more awake, but still confused  12/09 0701 - 12/10 0700 In: 2585.5 [I.V.:2581.3; IV Piggyback:4.2] Out: 900 [Urine:900]  Filed Weights   05/20/21 1326 05/21/21 0527  Weight: 53.5 kg 50.7 kg    Scheduled Meds:  vitamin C  500 mg Oral Daily   Chlorhexidine Gluconate Cloth  6 each Topical Daily   heparin  5,000 Units Subcutaneous Q8H   insulin aspart  0-9 Units Subcutaneous Q4H   insulin glargine-yfgn  10 Units Subcutaneous Q24H   Ipratropium-Albuterol  1 puff Inhalation BID   mouth rinse  15 mL Mouth Rinse BID   methylPREDNISolone (SOLU-MEDROL) injection  50 mg Intravenous Q12H   [START ON 05/24/2021] predniSONE  50 mg Oral Q breakfast   zinc sulfate  220 mg Oral Daily   Continuous Infusions:  sodium chloride 10 mL/hr at 05/21/21 1143   ceFEPime (MAXIPIME) IV     dextrose 5% lactated ringers 50 mL/hr at 05/22/21 0600   remdesivir 100 mg in NS 100 mL Stopped (05/21/21 1322)   PRN Meds:.acetaminophen, dextrose, guaiFENesin-dextromethorphan, Ipratropium-Albuterol, lip balm, ondansetron **OR** ondansetron (ZOFRAN) IV, senna-docusate  Current Labs: reviewed   Physical Exam:  Blood pressure (!) 123/105, pulse 90, temperature 97.6 F (36.4 C), temperature source Axillary, resp. rate (!) 26, height 5\' 5"  (1.651 m), weight 50.7 kg, SpO2 90 %. Thin elderly female, somnolent Regular, nl s1s2 S/nt Coarse b/l nl wob No LEE Sarcopenic No rashes  A **Acute hypoxic respiratory failure: secondary to COVID PNA poss bacterial superinfection. Being treated with remdesivir and cefepime  Improving   **Shock, presumably septic r/o hypovolemic: resuscitation and vasopressor support PRN.  UCx + E. Coli Sn pending on cefepime, BCx NGTD   **AKI on CKD 4:  Baseline Cr in low to mid 2s, improving with hydration and supportie care in past 24h.  UA consistent with infection. Renal  US w/o obstructionl  UOP picking up. GFR improving   **DKA:  resolved   **Hyponatremia:  resolved   **Anemia: mild, no intervention needed at this time.    **Hyperkalemia:  resolved   **Dementia, acute encephalopathy   **Hypertension: normotensive to hypertensive.  On amlodipine at baseline, would hold for now to ensure hemodynamic stability to avoid hypotension.    Will sign off for now.  Please call with any questions or concerns.  Pt does not need follow up with nephrology follow up at this time as outpatient.     Pearson Grippe MD 05/22/2021, 10:05 AM  Recent Labs  Lab 05/21/21 0242 05/21/21 0843 05/22/21 0308  NA 137 137 138  K 4.5 4.2 4.9  CL 106 107 109  CO2 17* 18* 19*  GLUCOSE 192* 110* 270*  BUN 79* 79* 69*  CREATININE 3.68* 3.59* 3.23*  CALCIUM 8.2* 8.4* 8.4*    Recent Labs  Lab 05/20/21 1328 05/20/21 1424 05/21/21 0843 05/22/21 0308  WBC 19.6*  --  17.1* 18.7*  NEUTROABS 18.0*  --  14.7* 17.1*  HGB 10.7* 11.2* 9.7* 9.4*  HCT 32.9* 33.0* 30.9* 28.4*  MCV 88.9  --  90.4 87.7  PLT 292  --  241 223

## 2021-05-22 NOTE — Progress Notes (Signed)
PROGRESS NOTE  Sandra Sheppard:[id number] DOB: 02/14/1946 DOA: 05/20/2021 PCP: Prince Solian, MD  HPI/Recap of past 24 hours: Sandra Sheppard is a 75 y.o. female with medical history significant for hypertension, hyperlipidemia, CKD stage IV, diabetes mellitus type 2, aortic valve replacement, dementia, presenting from [facility] with complaints of hypoxia, fever, COVID-positive, and hyperglycemia.  Patient unable to give any history due to acute metabolic encephalopathy.  Per EMS staff, patient was reported to be saturating around 89% on room air and was placed on 4 L of O2.  In the ED, patient noted to be afebrile, tachycardic and heart rate in the 90s, BP stable, saturating around 96% on 4 L of O2.  Labs showed marked leukocytosis 19.6, LA 2.2-->4.6, glucose 546, K+ 5.5, NA 128, creatinine 4.02, above baseline, procalcitonin 2.35, COVID-19 positive, chest x-ray showed bibasilar airspace disease suspicious for pneumonia.  Patient was started on remdesivir, broad-spectrum antibiotics, IV fluids and 5 units of insulin.  At baseline, oriented to self, but as per sister has been going downhill. Nephrology was consulted by EDP.  Patient admitted for further management.   05/22/21: Patient was seen and examined at bedside.  She is alert and confused in the setting of dementia.   Assessment/Plan: Principal Problem:   Pneumonia due to COVID-19 virus Active Problems:   HYPERCHOLESTEROLEMIA  IIA   HYPERTENSION, MILD   H/O aortic valve replacement   AKI (acute kidney injury) (HCC)   Metabolic acidosis, increased anion gap   Diabetes mellitus type II, non insulin dependent (HCC)   Acute kidney injury superimposed on CKD (HCC)   CKD (chronic kidney disease) stage 4, GFR 15-29 ml/min (HCC)   Dementia with behavioral disturbance   Acute metabolic encephalopathy   Pressure injury of skin   Sepsis likely 2/2 pneumonia due to COVID-19 infection Vs UTI On admission, required around 4L of O2,  currently on RA satting around 94% On presentation, was tachycardic, tachypneic, with marked leukocytosis Currently afebrile with marked leukocytosis LA 2.2-->4.6-->1.7, procalcitonin elevated at 2.35--> <0.10 Inflammatory markers, fibrinogen 573, D-dimer 2.77, CRP elevated 17-->31, will trend Chest x-ray with bibasilar airspace disease, suspicious for pneumonia UC growing E.coli BC x2 NGTD Continue Remdesivir, IV solumedrol, inhaler No need for Actemra as patient on RA now, elevated CRP might be from UTI Continue cefepime, d/c vancomycin (MRSA screen neg) Continue supplemental O2  E.coli UTI, POA UA positive for infection, UC growing E. Coli Continue cefepime as above, pending final culture  Essential hypertension, BP is not at goal BP is elevated Resume home Norvasc 10 mg daily.   Resolved acute hypoxic respiratory failure She is currently on room air, 92%. Continue to monitor, oxygen supplementation as needed.   Type 2 diabetes mellitus with hyperglycemia Possible early DKA A1c 9.9 Not on any medication prior to arrival Glucose 546, bicarb 19, anion gap 13, beta hydroxybutyric acid 1.09 S/p Endotool insulin drip for severe hyperglycemia as patient altered and npo Switched to SSI Q4H, glargine, until able to tolerate orally Continue IV fluids Monitor closely   Acute metabolic encephalopathy Likely multifactorial from COVID-19 infection, severe hyperglycemia, UTI Management as above Reorient as needed Resume home Seroquel nightly   Resolved post repletion: Hyponatremia/hyperkalemia   AKI on CKD stage IV Likely 2/2 severe hyperglycemia, sepsis, poor oral intake, marked dehydration Baseline creatinine around 2.2, on presentation 4.02 Creatinine is downtrending, continue IV fluid. Continue strict I's and O's, avoid nephrotoxins Family not interested in dialysis if renal fxn significantly declines Daily CMP Appreciate  nephrology's assistance, signed off.   Anemia of  chronic kidney disease Hemoglobin with drop likely 2/2 hemodilution Daily CBC Hemoglobin stable at this time.   Elevated D-dimer Likely in the setting of COVID infection Unable to perform CTA chest due to CKD Lower extremity Doppler negative for DVT May need VQ scan pending clinical status, and if D-dimer is rising significantly   Hypertension BP soft Hold home BP meds for now   Paroxysmal A. Fib Currently in sinus rhythm, rate controlled Not a good candidate for anticoagulation as per family, hx of multiple falls Not on any rate controlling medication   History of CVA Continue with aspirin, statins once able   Dementia with behavioral disturbance and hallucination Currently altered, at baseline only oriented to self Hold PTA Lexapro, Depakote, Seroquel for now until able to tolerate orally or more awake  [place] Patient with very poor prognosis, currently DNR Palliative consulted      Estimated body mass index is 18.6 kg/m as calculated from the following:   Height as of this encounter: 5\' 5"  (1.651 m).   Weight as of this encounter: 50.7 kg.     Code Status: DNR  Family Communication: Discussed with sister on 05/20/2021  Disposition Plan: Status is: Inpatient  Remains inpatient appropriate because: Level of care   Consultants: Nephrology  Procedures: None  Antimicrobials: Cefepime  DVT prophylaxis: Heparin    Objective: Vitals:   05/22/21 1200 05/22/21 1300 05/22/21 1342 05/22/21 1400  BP: (!) 165/69 (!) 183/71 (!) 175/64 (!) 187/72  Pulse: 78 88 87 89  Resp: 20 20 17 18   Temp: 98.6 F (37 C)     TempSrc: Axillary     SpO2: 93% 96% 95% 93%  Weight:      Height:        Intake/Output Summary (Last 24 hours) at 05/22/2021 1410 Last data filed at 05/22/2021 1328 Gross per 24 hour  Intake 1630.72 ml  Output 1250 ml  Net 380.72 ml   Filed Weights   05/20/21 1326 05/21/21 0527  Weight: 53.5 kg 50.7 kg    Exam: General: Frail-appearing no  acute distress patient is alert and confused.   Cardiovascular: Regular rate and rhythm no rubs or gallops.  Respiratory: Clear to auscultation with no wheezes or rales.  Abdomen: Soft nontender normal bowel sounds present.  Musculoskeletal: No lower extremity edema bilaterally.   Skin: No ulcerative lesions noted.   Psychiatry: Mood is appropriate for condition and setting. Neuro: Moves all 4 extremities.    Data Reviewed: CBC: Recent Labs  Lab 05/20/21 1328 05/20/21 1424 05/21/21 0843 05/22/21 0308  WBC 19.6*  --  17.1* 18.7*  NEUTROABS 18.0*  --  14.7* 17.1*  HGB 10.7* 11.2* 9.7* 9.4*  HCT 32.9* 33.0* 30.9* 28.4*  MCV 88.9  --  90.4 87.7  PLT 292  --  241 546   Basic Metabolic Panel: Recent Labs  Lab 05/20/21 1328 05/20/21 1424 05/20/21 2155 05/21/21 0242 05/21/21 0843 05/22/21 0308  NA 128* 128* 135 137 137 138  K 5.5* 5.4* 4.8 4.5 4.2 4.9  CL 96* 99 106 106 107 109  CO2 19*  --  16* 17* 18* 19*  GLUCOSE 546* 548* 172* 192* 110* 270*  BUN 85* 97* 75* 79* 79* 69*  CREATININE 4.02* 4.40* 3.71* 3.68* 3.59* 3.23*  CALCIUM 8.8*  --  8.2* 8.2* 8.4* 8.4*  MG  --   --   --   --  2.0  --  GFR: Estimated Creatinine Clearance: 11.9 mL/min (A) (by C-G formula based on SCr of 3.23 mg/dL (H)). Liver Function Tests: Recent Labs  Lab 05/20/21 1328 05/22/21 0308  AST 14* 17  ALT 10 10  ALKPHOS 56 47  BILITOT 0.5 0.6  PROT 8.4* 6.9  ALBUMIN 3.6 2.6*   No results for input(s): LIPASE, AMYLASE in the last 168 hours. No results for input(s): AMMONIA in the last 168 hours. Coagulation Profile: No results for input(s): INR, PROTIME in the last 168 hours. Cardiac Enzymes: No results for input(s): CKTOTAL, CKMB, CKMBINDEX, TROPONINI in the last 168 hours. BNP (last 3 results) No results for input(s): PROBNP in the last 8760 hours. HbA1C: Recent Labs    05/20/21 1328  HGBA1C 9.9*   CBG: Recent Labs  Lab 05/21/21 2030 05/21/21 2353 05/22/21 0327 05/22/21 0800  05/22/21 1211  GLUCAP 232* 227* 226* 266* 245*   Lipid Profile: Recent Labs    05/20/21 1328  TRIG 401*   Thyroid Function Tests: No results for input(s): TSH, T4TOTAL, FREET4, T3FREE, THYROIDAB in the last 72 hours. Anemia Panel: Recent Labs    05/20/21 1328 05/22/21 0308  FERRITIN 307 357*   Urine analysis:    Component Value Date/Time   COLORURINE YELLOW 05/20/2021 1537   APPEARANCEUR CLOUDY (A) 05/20/2021 1537   LABSPEC 1.025 05/20/2021 1537   PHURINE 5.5 05/20/2021 1537   GLUCOSEU >=1000 (A) 05/20/2021 1537   HGBUR MODERATE (A) 05/20/2021 1537   BILIRUBINUR NEGATIVE 05/20/2021 1537   KETONESUR TRACE (A) 05/20/2021 1537   PROTEINUR 30 (A) 05/20/2021 1537   UROBILINOGEN 0.2 01/14/2010 1308   NITRITE NEGATIVE 05/20/2021 1537   LEUKOCYTESUR SMALL (A) 05/20/2021 1537   Sepsis Labs: @LABRCNTIP (procalcitonin:4,lacticidven:4)  ) Recent Results (from the past 240 hour(s))  Resp Panel by RT-PCR (Flu A&B, Covid) Nasopharyngeal Swab     Status: Abnormal   Collection Time: 05/20/21  1:15 PM   Specimen: Nasopharyngeal Swab; Nasopharyngeal(NP) swabs in vial transport medium  Result Value Ref Range Status   SARS Coronavirus 2 by RT PCR POSITIVE (A) NEGATIVE Final    Comment: RESULT CALLED TO, READ BACK BY AND VERIFIED WITH: BRATU,D. EMTP AT 1434 05/20/21 MULLINS,T (NOTE) SARS-CoV-2 target nucleic acids are DETECTED.  The SARS-CoV-2 RNA is generally detectable in upper respiratory specimens during the acute phase of infection. Positive results are indicative of the presence of the identified virus, but do not rule out bacterial infection or co-infection with other pathogens not detected by the test. Clinical correlation with patient history and other diagnostic information is necessary to determine patient infection status. The expected result is Negative.  Fact Sheet for Patients: EntrepreneurPulse.com.au  Fact Sheet for Healthcare  Providers: IncredibleEmployment.be  This test is not yet approved or cleared by the [place] FDA and  has been authorized for detection and/or diagnosis of SARS-CoV-2 by FDA under an Emergency Use Authorization (EUA).  This EUA will remain in effect (meaning this test  can be used) for the duration of  the COVID-19 declaration under Section 564(b)(1) of the Act, 21 U.S.C. section 360bbb-3(b)(1), unless the authorization is terminated or revoked sooner.     Influenza A by PCR NEGATIVE NEGATIVE Final   Influenza B by PCR NEGATIVE NEGATIVE Final    Comment: (NOTE) The Xpert Xpress SARS-CoV-2/FLU/RSV plus assay is intended as an aid in the diagnosis of influenza from Nasopharyngeal swab specimens and should not be used as a sole basis for treatment. Nasal washings and aspirates are unacceptable for Xpert Xpress  SARS-CoV-2/FLU/RSV testing.  Fact Sheet for Patients: EntrepreneurPulse.com.au  Fact Sheet for Healthcare Providers: IncredibleEmployment.be  This test is not yet approved or cleared by the [place] FDA and has been authorized for detection and/or diagnosis of SARS-CoV-2 by FDA under an Emergency Use Authorization (EUA). This EUA will remain in effect (meaning this test can be used) for the duration of the COVID-19 declaration under Section 564(b)(1) of the Act, 21 U.S.C. section 360bbb-3(b)(1), unless the authorization is terminated or revoked.  Performed at [facility], [place] [address], [place], [place] [address]   Blood Culture (routine x 2)     Status: None (Preliminary result)   Collection Time: 05/20/21  1:15 PM   Specimen: BLOOD  Result Value Ref Range Status   Specimen Description   Final    BLOOD BLOOD LEFT HAND Performed at [facility] [address], [place], [place] [address]    Special Requests   Final    BOTTLES DRAWN AEROBIC AND ANAEROBIC Blood  Culture adequate volume Performed at [facility] [address], [place], [place] [address]    Culture   Final    NO GROWTH 2 DAYS Performed at [facility] [address], [place], [place] [address]    Report Status PENDING  Incomplete  Blood Culture (routine x 2)     Status: None (Preliminary result)   Collection Time: 05/20/21  1:28 PM   Specimen: BLOOD  Result Value Ref Range Status   Specimen Description   Final    BLOOD BLOOD RIGHT HAND Performed at [facility] [address], [place], [place] [address]    Special Requests   Final    BOTTLES DRAWN AEROBIC AND ANAEROBIC Blood Culture adequate volume Performed at [facility] [address] [place], [place] [address]    Culture   Final    NO GROWTH 2 DAYS Performed at [facility] [address] [place], [place] [address]    Report Status PENDING  Incomplete  Urine Culture     Status: Abnormal   Collection Time: 05/20/21  3:39 PM   Specimen: In/Out Cath Urine  Result Value Ref Range Status   Specimen Description   Final    IN/OUT CATH URINE Performed at [facility] [address], [place], [place] [address]    Special Requests   Final    NONE Performed at [facility], [place] [address], [place], [place] [address]    Culture >=100,000 COLONIES/mL ESCHERICHIA COLI (A)  Final   Report Status 05/22/2021 FINAL  Final   Organism ID, Bacteria ESCHERICHIA COLI (A)  Final      Susceptibility   Escherichia coli - MIC*    AMPICILLIN 4 SENSITIVE Sensitive     CEFAZOLIN <=4 SENSITIVE Sensitive     CEFEPIME <=0.12 SENSITIVE Sensitive     CEFTRIAXONE <=0.25 SENSITIVE Sensitive     CIPROFLOXACIN 0.5 INTERMEDIATE Intermediate     GENTAMICIN <=1 SENSITIVE Sensitive     IMIPENEM <=0.25 SENSITIVE Sensitive     NITROFURANTOIN <=16 SENSITIVE Sensitive     TRIMETH/SULFA >=320 RESISTANT Resistant     AMPICILLIN/SULBACTAM  <=2 SENSITIVE Sensitive     PIP/TAZO <=4 SENSITIVE Sensitive     * >=100,000 COLONIES/mL ESCHERICHIA COLI  MRSA Next Gen by PCR, Nasal     Status: None   Collection Time: 05/21/21 12:30 AM   Specimen: Nasal Mucosa; Nasal Swab  Result  Value Ref Range Status   MRSA by PCR Next Gen NOT DETECTED NOT DETECTED Final    Comment: (NOTE) The GeneXpert MRSA Assay (FDA approved for NASAL specimens only), is one component of a comprehensive MRSA colonization surveillance program. It is not intended to diagnose MRSA infection nor to guide or monitor treatment for MRSA infections. Test performance is not FDA approved in patients less than 24 years old. Performed at [facility], [place] [address] [place], [place] [address]       Studies: No results found.  Scheduled Meds:  amLODipine  10 mg Oral Daily   vitamin C  500 mg Oral Daily   Chlorhexidine Gluconate Cloth  6 each Topical Daily   heparin  5,000 Units Subcutaneous Q8H   insulin aspart  0-9 Units Subcutaneous Q4H   insulin glargine-yfgn  10 Units Subcutaneous Q24H   Ipratropium-Albuterol  1 puff Inhalation BID   mouth rinse  15 mL Mouth Rinse BID   methylPREDNISolone (SOLU-MEDROL) injection  50 mg Intravenous Q12H   [START ON 05/24/2021] predniSONE  50 mg Oral Q breakfast   QUEtiapine  25 mg Oral QHS   zinc sulfate  220 mg Oral Daily    Continuous Infusions:  sodium chloride 10 mL/hr at 05/21/21 1143   ceFEPime (MAXIPIME) IV     dextrose 5% lactated ringers 50 mL/hr at 05/22/21 1213   remdesivir 100 mg in NS 100 mL Stopped (05/22/21 1049)     LOS: 2 days     Kayleen Memos, MD [facility]  If 7PM-7AM, please contact night-coverage [email or web address] 05/22/2021, 2:10 PM

## 2021-05-22 NOTE — Progress Notes (Signed)
Pharmacy Antibiotic Note  Sandra Sheppard is a 74 y.o. female with a h/o CDK IV admitted on 05/20/2021 with sepsis possibly d/t PNA and COVID-19 infection. She's currently on cefepime for infection.  12/8 cefepime >> 12/8 vanc >> 12/9 12/8 Remdesivir>> (12/12)   12/8 BCx x2:  12/8 UCx:  >100K GNR Ecoli  12/8 MRSA PCR: neg 12/8 COVID+  Today, 05/22/2021: - day #3 abx - Tmax 99.1, wbc 18.7 - scr trending down to 3.23 (crcl~12)   Plan: - adjust cefepime dose to 2gm q24h - f/u suscept. For Ecoli and des-esc as needed - monitor renal cunction closely ___________________________________________  Height: 5\' 5"  (165.1 cm) Weight: 50.7 kg (111 lb 12.4 oz) IBW/kg (Calculated) : 57  Temp (24hrs), Avg:97.7 F (36.5 C), Min:96.6 F (35.9 C), Max:98.9 F (37.2 C)  Recent Labs  Lab 05/20/21 1328 05/20/21 1424 05/20/21 1644 05/20/21 2155 05/21/21 0242 05/21/21 0843 05/22/21 0308  WBC 19.6*  --   --   --   --  17.1* 18.7*  CREATININE 4.02* 4.40*  --  3.71* 3.68* 3.59* 3.23*  LATICACIDVEN 2.2*  --  4.6*  --  1.7  --   --      Estimated Creatinine Clearance: 11.9 mL/min (A) (by C-G formula based on SCr of 3.23 mg/dL (H)).    Allergies  Allergen Reactions   Ativan [Lorazepam] Other (See Comments)    Confusion   Codeine Other (See Comments)    Rash, Nausea and Vomiting    Sulfonamide Derivatives Other (See Comments)    Unspecified   Penicillins Rash    Has patient had a PCN reaction causing immediate rash, facial/tongue/throat swelling, SOB or lightheadedness with hypotension: YES Has patient had a PCN reaction causing severe rash involving mucus membranes or skin necrosis:NO Has patient had a PCN reaction that required hospitalization NO Has patient had a PCN reaction occurring within the last 10 years: NO If all of the above answers are "NO", then may proceed with Cephalosporin use.     Thank you for allowing pharmacy to be a part of this patient's care.  Lynelle Doctor 05/22/2021 9:06 AM

## 2021-05-22 NOTE — Plan of Care (Signed)
  Problem: Clinical Measurements: Goal: Respiratory complications will improve Outcome: Progressing   Continues on room air this shift

## 2021-05-23 DIAGNOSIS — J1282 Pneumonia due to coronavirus disease 2019: Secondary | ICD-10-CM | POA: Diagnosis not present

## 2021-05-23 DIAGNOSIS — U071 COVID-19: Secondary | ICD-10-CM | POA: Diagnosis not present

## 2021-05-23 LAB — GLUCOSE, CAPILLARY
Glucose-Capillary: 197 mg/dL — ABNORMAL HIGH (ref 70–99)
Glucose-Capillary: 206 mg/dL — ABNORMAL HIGH (ref 70–99)
Glucose-Capillary: 218 mg/dL — ABNORMAL HIGH (ref 70–99)
Glucose-Capillary: 221 mg/dL — ABNORMAL HIGH (ref 70–99)
Glucose-Capillary: 243 mg/dL — ABNORMAL HIGH (ref 70–99)

## 2021-05-23 LAB — COMPREHENSIVE METABOLIC PANEL
ALT: 11 U/L (ref 0–44)
AST: 13 U/L — ABNORMAL LOW (ref 15–41)
Albumin: 2.4 g/dL — ABNORMAL LOW (ref 3.5–5.0)
Alkaline Phosphatase: 40 U/L (ref 38–126)
Anion gap: 9 (ref 5–15)
BUN: 69 mg/dL — ABNORMAL HIGH (ref 8–23)
CO2: 19 mmol/L — ABNORMAL LOW (ref 22–32)
Calcium: 8.2 mg/dL — ABNORMAL LOW (ref 8.9–10.3)
Chloride: 112 mmol/L — ABNORMAL HIGH (ref 98–111)
Creatinine, Ser: 2.8 mg/dL — ABNORMAL HIGH (ref 0.44–1.00)
GFR, Estimated: 17 mL/min — ABNORMAL LOW (ref >60)
Glucose, Bld: 231 mg/dL — ABNORMAL HIGH (ref 70–99)
Potassium: 4.1 mmol/L (ref 3.5–5.1)
Sodium: 140 mmol/L (ref 135–145)
Total Bilirubin: 0.4 mg/dL (ref 0.3–1.2)
Total Protein: 6.2 g/dL — ABNORMAL LOW (ref 6.5–8.1)

## 2021-05-23 LAB — CBC WITH DIFFERENTIAL/PLATELET
Abs Immature Granulocytes: 0.16 10*3/uL — ABNORMAL HIGH (ref 0.00–0.07)
Basophils Absolute: 0 10*3/uL (ref 0.0–0.1)
Basophils Relative: 0 %
Eosinophils Absolute: 0 10*3/uL (ref 0.0–0.5)
Eosinophils Relative: 0 %
HCT: 27 % — ABNORMAL LOW (ref 36.0–46.0)
Hemoglobin: 8.6 g/dL — ABNORMAL LOW (ref 12.0–15.0)
Immature Granulocytes: 1 %
Lymphocytes Relative: 5 %
Lymphs Abs: 0.6 10*3/uL — ABNORMAL LOW (ref 0.7–4.0)
MCH: 28.5 pg (ref 26.0–34.0)
MCHC: 31.9 g/dL (ref 30.0–36.0)
MCV: 89.4 fL (ref 80.0–100.0)
Monocytes Absolute: 0.3 10*3/uL (ref 0.1–1.0)
Monocytes Relative: 3 %
Neutro Abs: 10.8 10*3/uL — ABNORMAL HIGH (ref 1.7–7.7)
Neutrophils Relative %: 91 %
Platelets: 235 10*3/uL (ref 150–400)
RBC: 3.02 MIL/uL — ABNORMAL LOW (ref 3.87–5.11)
RDW: 13.9 % (ref 11.5–15.5)
WBC: 11.9 10*3/uL — ABNORMAL HIGH (ref 4.0–10.5)
nRBC: 0.2 % (ref 0.0–0.2)

## 2021-05-23 LAB — FERRITIN: Ferritin: 317 ng/mL — ABNORMAL HIGH (ref 11–307)

## 2021-05-23 LAB — D-DIMER, QUANTITATIVE: D-Dimer, Quant: 2.3 ug/mL-FEU — ABNORMAL HIGH (ref 0.00–0.50)

## 2021-05-23 LAB — C-REACTIVE PROTEIN: CRP: 13.6 mg/dL — ABNORMAL HIGH (ref <1.0)

## 2021-05-23 MED ORDER — SODIUM CHLORIDE 0.9 % IV SOLN
1.0000 g | INTRAVENOUS | Status: DC
  Administered 2021-05-23: 1 g via INTRAVENOUS
  Filled 2021-05-23: qty 10

## 2021-05-23 MED ORDER — INSULIN GLARGINE-YFGN 100 UNIT/ML ~~LOC~~ SOLN
10.0000 [IU] | Freq: Two times a day (BID) | SUBCUTANEOUS | Status: DC
  Administered 2021-05-23 – 2021-05-24 (×4): 10 [IU] via SUBCUTANEOUS
  Filled 2021-05-23 (×6): qty 0.1

## 2021-05-23 MED ORDER — LACTATED RINGERS IV SOLN
INTRAVENOUS | Status: DC
Start: 2021-05-23 — End: 2021-05-24

## 2021-05-23 NOTE — Progress Notes (Signed)
PROGRESS NOTE  Sandra Sheppard YKD:[id number] DOB: 1945/07/25 DOA: 05/20/2021 PCP: Prince Solian, MD  HPI/Recap of past 24 hours: Sandra Sheppard is a 75 y.o. female with medical history significant for hypertension, hyperlipidemia, CKD stage IV, diabetes mellitus type 2, aortic valve replacement, dementia, presenting from [facility] with complaints of hypoxia, fever, COVID-positive, and hyperglycemia.  Patient unable to give any history due to acute metabolic encephalopathy.  Per EMS staff, patient was reported to be saturating around 89% on room air and was placed on 4 L of O2.  In the ED, patient noted to be afebrile, tachycardic and heart rate in the 90s, BP stable, saturating around 96% on 4 L of O2.  Labs showed marked leukocytosis 19.6, LA 2.2-->4.6, glucose 546, K+ 5.5, NA 128, creatinine 4.02, above baseline, procalcitonin 2.35, COVID-19 positive, chest x-ray showed bibasilar airspace disease suspicious for pneumonia.  Patient was started on remdesivir, broad-spectrum antibiotics, IV fluids and 5 units of insulin.  At baseline, oriented to self, but as per sister has been going downhill. Nephrology was consulted by EDP.  Patient admitted for further management.   05/23/21: Patient seen and examined at bedside.  No acute events overnight.  He has no new complaints.  She is minimally interactive.   Assessment/Plan: Principal Problem:   Pneumonia due to COVID-19 virus Active Problems:   HYPERCHOLESTEROLEMIA  IIA   HYPERTENSION, MILD   H/O aortic valve replacement   AKI (acute kidney injury) (HCC)   Metabolic acidosis, increased anion gap   Diabetes mellitus type II, non insulin dependent (HCC)   Acute kidney injury superimposed on CKD (HCC)   CKD (chronic kidney disease) stage 4, GFR 15-29 ml/min (HCC)   Dementia with behavioral disturbance   Acute metabolic encephalopathy   Pressure injury of skin   Sepsis likely 2/2 pneumonia due to COVID-19 infection, E. coli UTI On  admission, required around 4L of O2, currently on RA satting around 94% On presentation, was tachycardic, tachypneic, with marked leukocytosis Currently afebrile Leukocytosis is resolving 11.9 from 18.5. LA 2.2-->4.6-->1.7, procalcitonin elevated at 2.35--> <0.10 Inflammatory markers, fibrinogen 573, D-dimer 2.77, CRP elevated 17-->31, will trend Chest x-ray with bibasilar airspace disease, suspicious for pneumonia UC growing E.coli BC x2 NGTD Continue Remdesivir, IV solumedrol, inhaler No need for Actemra as patient on RA now, elevated CRP might be from UTI Cefepime switched to Rocephin on 05/23/2021.  Improving, AKI on CKD stage IV Likely 2/2 severe hyperglycemia, sepsis, poor oral intake, marked dehydration Baseline creatinine around 2.2, on presentation 4.02 Creatinine downtrending 2.80 from 3.23. Continue strict I's and O's, avoid nephrotoxins Family not interested in dialysis if renal fxn significantly declines Daily CMP Appreciate nephrology's assistance, signed off. Continue gentle IV fluid hydration LR 50 cc/h x 2 days. Continue to closely monitor urine output.  E.coli UTI, POA UA positive for infection, UC growing E. Coli Rocephin 05/23/2021.  Essential hypertension, BP is not at goal BP is elevated Continue home Norvasc 10 mg daily.   Resolved acute hypoxic respiratory failure She is currently on room air, 92%. Continue to monitor, oxygen supplementation as needed.   Type 2 diabetes mellitus with hyperglycemia Possible early DKA A1c 9.9 Not on any medication prior to arrival Glucose 546, bicarb 19, anion gap 13, beta hydroxybutyric acid 1.09 S/p Endotool insulin drip for severe hyperglycemia as patient altered and npo Switched to SSI Q4H, glargine, until able to tolerate orally Continue IV fluids Monitor closely   Acute metabolic encephalopathy Likely multifactorial from COVID-19 infection,  severe hyperglycemia, UTI Management as above Reorient as  needed Resume home Seroquel nightly   Resolved post repletion: Hyponatremia/hyperkalemia   Anemia of chronic kidney disease Hemoglobin with drop likely 2/2 hemodilution Daily CBC Hemoglobin stable at this time.   Elevated D-dimer Likely in the setting of COVID infection Unable to perform CTA chest due to CKD Lower extremity Doppler negative for DVT May need VQ scan pending clinical status, and if D-dimer is rising significantly   Hypertension BP soft Hold home BP meds for now   Paroxysmal A. Fib Currently in sinus rhythm, rate controlled Not a good candidate for anticoagulation as per family, hx of multiple falls Not on any rate controlling medication   History of CVA Continue with aspirin, statins once able   Dementia with behavioral disturbance and hallucination Currently altered, at baseline only oriented to self Hold PTA Lexapro, Depakote, Seroquel for now until able to tolerate orally or more awake  [place] Patient with very poor prognosis, currently DNR Palliative consulted  Physical debility PT OT to assess Continue fall precautions.      Estimated body mass index is 18.6 kg/m as calculated from the following:   Height as of this encounter: 5\' 5"  (1.651 m).   Weight as of this encounter: 50.7 kg.     Code Status: DNR  Family Communication: Discussed with sister on 05/20/2021  Disposition Plan: Status is: Inpatient  Remains inpatient appropriate because: Level of care   Consultants: Nephrology  Procedures: None  Antimicrobials: Cefepime  DVT prophylaxis: Heparin [place]   Objective: Vitals:   05/23/21 1300 05/23/21 1400 05/23/21 1500 05/23/21 1540  BP: (!) 146/67 (!) 151/63 134/60 127/76  Pulse: 88 87 89 91  Resp: (!) 21 (!) 22 19 (!) 24  Temp:    98.3 F (36.8 C)  TempSrc:    Oral  SpO2: 90% 96% 96% 97%  Weight:      Height:        Intake/Output Summary (Last 24 hours) at 05/23/2021 1606 Last data filed at 05/23/2021 1511 Gross per 24  hour  Intake 742.2 ml  Output 950 ml  Net -207.8 ml   Filed Weights   05/20/21 1326 05/21/21 0527  Weight: 53.5 kg 50.7 kg    Exam:  General: Frail-appearing no acute distress.  She is alert and confused.   Cardiovascular: Regular rate and rhythm no rubs or gallops.  Respiratory: Clear to auscultation with no wheezes or rales.  Abdomen: Soft nontender normal bowel sounds present.  Musculoskeletal: No lower extremity edema bilaterally.  Skin: No ulcerative lesions noted. Psychiatry: Mood is appropriate for condition and setting. Neuro: Moves all 4 extremities.  Nonfocal.    Data Reviewed: CBC: Recent Labs  Lab 05/20/21 1328 05/20/21 1424 05/21/21 0843 05/22/21 0308 05/23/21 0312  WBC 19.6*  --  17.1* 18.7* 11.9*  NEUTROABS 18.0*  --  14.7* 17.1* 10.8*  HGB 10.7* 11.2* 9.7* 9.4* 8.6*  HCT 32.9* 33.0* 30.9* 28.4* 27.0*  MCV 88.9  --  90.4 87.7 89.4  PLT 292  --  241 223 378   Basic Metabolic Panel: Recent Labs  Lab 05/20/21 2155 05/21/21 0242 05/21/21 0843 05/22/21 0308 05/23/21 0312  NA 135 137 137 138 140  K 4.8 4.5 4.2 4.9 4.1  CL 106 106 107 109 112*  CO2 16* 17* 18* 19* 19*  GLUCOSE 172* 192* 110* 270* 231*  BUN 75* 79* 79* 69* 69*  CREATININE 3.71* 3.68* 3.59* 3.23* 2.80*  CALCIUM 8.2* 8.2* 8.4* 8.4*  8.2*  MG  --   --  2.0  --   --    GFR: Estimated Creatinine Clearance: 13.7 mL/min (A) (by C-G formula based on SCr of 2.8 mg/dL (H)). Liver Function Tests: Recent Labs  Lab 05/20/21 1328 05/22/21 0308 05/23/21 0312  AST 14* 17 13*  ALT 10 10 11   ALKPHOS 56 47 40  BILITOT 0.5 0.6 0.4  PROT 8.4* 6.9 6.2*  ALBUMIN 3.6 2.6* 2.4*   No results for input(s): LIPASE, AMYLASE in the last 168 hours. No results for input(s): AMMONIA in the last 168 hours. Coagulation Profile: No results for input(s): INR, PROTIME in the last 168 hours. Cardiac Enzymes: No results for input(s): CKTOTAL, CKMB, CKMBINDEX, TROPONINI in the last 168 hours. BNP (last 3  results) No results for input(s): PROBNP in the last 8760 hours. HbA1C: No results for input(s): HGBA1C in the last 72 hours.  CBG: Recent Labs  Lab 05/22/21 2008 05/22/21 2331 05/23/21 0401 05/23/21 0722 05/23/21 1535  GLUCAP 276* 247* 218* 197* 243*   Lipid Profile: No results for input(s): CHOL, HDL, LDLCALC, TRIG, CHOLHDL, LDLDIRECT in the last 72 hours.  Thyroid Function Tests: No results for input(s): TSH, T4TOTAL, FREET4, T3FREE, THYROIDAB in the last 72 hours. Anemia Panel: Recent Labs    05/22/21 0308 05/23/21 0312  FERRITIN 357* 317*   Urine analysis:    Component Value Date/Time   COLORURINE YELLOW 05/20/2021 1537   APPEARANCEUR CLOUDY (A) 05/20/2021 1537   LABSPEC 1.025 05/20/2021 1537   PHURINE 5.5 05/20/2021 1537   GLUCOSEU >=1000 (A) 05/20/2021 1537   HGBUR MODERATE (A) 05/20/2021 1537   BILIRUBINUR NEGATIVE 05/20/2021 1537   KETONESUR TRACE (A) 05/20/2021 1537   PROTEINUR 30 (A) 05/20/2021 1537   UROBILINOGEN 0.2 01/14/2010 1308   NITRITE NEGATIVE 05/20/2021 1537   LEUKOCYTESUR SMALL (A) 05/20/2021 1537   Sepsis Labs: @LABRCNTIP (procalcitonin:4,lacticidven:4)  ) Recent Results (from the past 240 hour(s))  Resp Panel by RT-PCR (Flu A&B, Covid) Nasopharyngeal Swab     Status: Abnormal   Collection Time: 05/20/21  1:15 PM   Specimen: Nasopharyngeal Swab; Nasopharyngeal(NP) swabs in vial transport medium  Result Value Ref Range Status   SARS Coronavirus 2 by RT PCR POSITIVE (A) NEGATIVE Final    Comment: RESULT CALLED TO, READ BACK BY AND VERIFIED WITH: BRATU,D. EMTP AT 1434 05/20/21 MULLINS,T (NOTE) SARS-CoV-2 target nucleic acids are DETECTED.  The SARS-CoV-2 RNA is generally detectable in upper respiratory specimens during the acute phase of infection. Positive results are indicative of the presence of the identified virus, but do not rule out bacterial infection or co-infection with other pathogens not detected by the test. Clinical  correlation with patient history and other diagnostic information is necessary to determine patient infection status. The expected result is Negative.  Fact Sheet for Patients: EntrepreneurPulse.com.au  Fact Sheet for Healthcare Providers: IncredibleEmployment.be  This test is not yet approved or cleared by the [place] FDA and  has been authorized for detection and/or diagnosis of SARS-CoV-2 by FDA under an Emergency Use Authorization (EUA).  This EUA will remain in effect (meaning this test  can be used) for the duration of  the COVID-19 declaration under Section 564(b)(1) of the Act, 21 U.S.C. section 360bbb-3(b)(1), unless the authorization is terminated or revoked sooner.     Influenza A by PCR NEGATIVE NEGATIVE Final   Influenza B by PCR NEGATIVE NEGATIVE Final    Comment: (NOTE) The Xpert Xpress SARS-CoV-2/FLU/RSV plus assay is intended as an aid  in the diagnosis of influenza from Nasopharyngeal swab specimens and should not be used as a sole basis for treatment. Nasal washings and aspirates are unacceptable for Xpert Xpress SARS-CoV-2/FLU/RSV testing.  Fact Sheet for Patients: EntrepreneurPulse.com.au  Fact Sheet for Healthcare Providers: IncredibleEmployment.be  This test is not yet approved or cleared by the [place] FDA and has been authorized for detection and/or diagnosis of SARS-CoV-2 by FDA under an Emergency Use Authorization (EUA). This EUA will remain in effect (meaning this test can be used) for the duration of the COVID-19 declaration under Section 564(b)(1) of the Act, 21 U.S.C. section 360bbb-3(b)(1), unless the authorization is terminated or revoked.  Performed at [facility], [place] [address], [place], [place] [address]   Blood Culture (routine x 2)     Status: None (Preliminary result)   Collection Time: 05/20/21  1:15 PM   Specimen: BLOOD  Result  Value Ref Range Status   Specimen Description   Final    BLOOD BLOOD LEFT HAND Performed at [place] [address], [place], [place] [address]    Special Requests   Final    BOTTLES DRAWN AEROBIC AND ANAEROBIC Blood Culture adequate volume Performed at [place] [address] [place], [place] [address]    Culture   Final    NO GROWTH 3 DAYS Performed at [facility], [place] [address] [place], [place] [address]    Report Status PENDING  Incomplete  Blood Culture (routine x 2)     Status: None (Preliminary result)   Collection Time: 05/20/21  1:28 PM   Specimen: BLOOD  Result Value Ref Range Status   Specimen Description   Final    BLOOD BLOOD RIGHT HAND Performed at [place] [address], [place], [place] [address]    Special Requests   Final    BOTTLES DRAWN AEROBIC AND ANAEROBIC Blood Culture adequate volume Performed at [place] [address] [place], [place] [address]    Culture   Final    NO GROWTH 3 DAYS Performed at [facility], [place] [address], [place], [place] [address]    Report Status PENDING  Incomplete  Urine Culture     Status: Abnormal   Collection Time: 05/20/21  3:39 PM   Specimen: In/Out Cath Urine  Result Value Ref Range Status   Specimen Description   Final    IN/OUT CATH URINE Performed at [place] [address], [place], [place] [address]    Special Requests   Final    NONE Performed at [facility], [place] [address] [place] [address], [place], [place] [address]    Culture >=100,000 COLONIES/mL ESCHERICHIA COLI (A)  Final   Report Status 05/22/2021 FINAL  Final   Organism ID, Bacteria ESCHERICHIA COLI (A)  Final      Susceptibility   Escherichia coli - MIC*    AMPICILLIN 4 SENSITIVE Sensitive     CEFAZOLIN <=4 SENSITIVE Sensitive     CEFEPIME <=0.12 SENSITIVE Sensitive     CEFTRIAXONE <=0.25 SENSITIVE Sensitive      CIPROFLOXACIN 0.5 INTERMEDIATE Intermediate     GENTAMICIN <=1 SENSITIVE Sensitive     IMIPENEM <=0.25 SENSITIVE Sensitive     NITROFURANTOIN <=16 SENSITIVE Sensitive     TRIMETH/SULFA >=320 RESISTANT Resistant     AMPICILLIN/SULBACTAM <=2 SENSITIVE Sensitive     PIP/TAZO <=4 SENSITIVE Sensitive     * >=100,000 COLONIES/mL ESCHERICHIA COLI  MRSA Next Gen by PCR, Nasal     Status: None   Collection Time: 05/21/21 12:30 AM   Specimen: Nasal Mucosa; Nasal Swab  Result Value Ref Range Status   MRSA by PCR Next Gen NOT DETECTED NOT DETECTED Final    Comment: (NOTE) The GeneXpert MRSA Assay (FDA approved for NASAL specimens only), is one component of a comprehensive MRSA colonization surveillance program. It is not intended to diagnose MRSA infection nor to guide or monitor treatment for MRSA infections. Test performance is not FDA approved in patients less than 55 years old. Performed at [facility], [place] [address] [place], [place] [address]       Studies: No results found.  Scheduled Meds:  amLODipine  10 mg Oral Daily   vitamin C  500 mg Oral Daily   Chlorhexidine Gluconate Cloth  6 each Topical Daily   heparin  5,000 Units Subcutaneous Q8H   insulin aspart  0-9 Units Subcutaneous Q4H   insulin glargine-yfgn  10 Units Subcutaneous BID   Ipratropium-Albuterol  1 puff Inhalation BID   mouth rinse  15 mL Mouth Rinse BID   methylPREDNISolone (SOLU-MEDROL) injection  50 mg Intravenous Q12H   [START ON 05/24/2021] predniSONE  50 mg Oral Q breakfast   QUEtiapine  25 mg Oral QHS   zinc sulfate  220 mg Oral Daily    Continuous Infusions:  sodium chloride 10 mL/hr at 05/21/21 1143   cefTRIAXone (ROCEPHIN)  IV 200 mL/hr at 05/23/21 1511   lactated ringers Stopped (05/23/21 1444)   remdesivir 100 mg in NS 100 mL Stopped (05/23/21 1110)     LOS: 3 days     Kayleen Memos, MD [facility]  If 7PM-7AM, please contact  night-coverage [email or web address] 05/23/2021, 4:06 PM

## 2021-05-24 DIAGNOSIS — E119 Type 2 diabetes mellitus without complications: Secondary | ICD-10-CM

## 2021-05-24 DIAGNOSIS — N179 Acute kidney failure, unspecified: Secondary | ICD-10-CM

## 2021-05-24 DIAGNOSIS — G9341 Metabolic encephalopathy: Secondary | ICD-10-CM

## 2021-05-24 DIAGNOSIS — Z7189 Other specified counseling: Secondary | ICD-10-CM

## 2021-05-24 DIAGNOSIS — Z515 Encounter for palliative care: Secondary | ICD-10-CM

## 2021-05-24 DIAGNOSIS — J1282 Pneumonia due to coronavirus disease 2019: Secondary | ICD-10-CM | POA: Diagnosis not present

## 2021-05-24 DIAGNOSIS — N189 Chronic kidney disease, unspecified: Secondary | ICD-10-CM

## 2021-05-24 DIAGNOSIS — U071 COVID-19: Secondary | ICD-10-CM

## 2021-05-24 DIAGNOSIS — F03918 Unspecified dementia, unspecified severity, with other behavioral disturbance: Secondary | ICD-10-CM

## 2021-05-24 DIAGNOSIS — Z66 Do not resuscitate: Secondary | ICD-10-CM

## 2021-05-24 LAB — COMPREHENSIVE METABOLIC PANEL
ALT: 11 U/L (ref 0–44)
AST: 15 U/L (ref 15–41)
Albumin: 2.6 g/dL — ABNORMAL LOW (ref 3.5–5.0)
Alkaline Phosphatase: 44 U/L (ref 38–126)
Anion gap: 11 (ref 5–15)
BUN: 63 mg/dL — ABNORMAL HIGH (ref 8–23)
CO2: 18 mmol/L — ABNORMAL LOW (ref 22–32)
Calcium: 8.4 mg/dL — ABNORMAL LOW (ref 8.9–10.3)
Chloride: 112 mmol/L — ABNORMAL HIGH (ref 98–111)
Creatinine, Ser: 2.73 mg/dL — ABNORMAL HIGH (ref 0.44–1.00)
GFR, Estimated: 17 mL/min — ABNORMAL LOW (ref >60)
Glucose, Bld: 177 mg/dL — ABNORMAL HIGH (ref 70–99)
Potassium: 4.3 mmol/L (ref 3.5–5.1)
Sodium: 141 mmol/L (ref 135–145)
Total Bilirubin: 0.5 mg/dL (ref 0.3–1.2)
Total Protein: 6.7 g/dL (ref 6.5–8.1)

## 2021-05-24 LAB — CBC WITH DIFFERENTIAL/PLATELET
Abs Immature Granulocytes: 0.46 10*3/uL — ABNORMAL HIGH (ref 0.00–0.07)
Basophils Absolute: 0 10*3/uL (ref 0.0–0.1)
Basophils Relative: 0 %
Eosinophils Absolute: 0 10*3/uL (ref 0.0–0.5)
Eosinophils Relative: 0 %
HCT: 28.8 % — ABNORMAL LOW (ref 36.0–46.0)
Hemoglobin: 9.4 g/dL — ABNORMAL LOW (ref 12.0–15.0)
Immature Granulocytes: 5 %
Lymphocytes Relative: 8 %
Lymphs Abs: 0.7 10*3/uL (ref 0.7–4.0)
MCH: 28.6 pg (ref 26.0–34.0)
MCHC: 32.6 g/dL (ref 30.0–36.0)
MCV: 87.5 fL (ref 80.0–100.0)
Monocytes Absolute: 0.4 10*3/uL (ref 0.1–1.0)
Monocytes Relative: 4 %
Neutro Abs: 7.4 10*3/uL (ref 1.7–7.7)
Neutrophils Relative %: 83 %
Platelets: 257 10*3/uL (ref 150–400)
RBC: 3.29 MIL/uL — ABNORMAL LOW (ref 3.87–5.11)
RDW: 14 % (ref 11.5–15.5)
WBC: 9 10*3/uL (ref 4.0–10.5)
nRBC: 0 % (ref 0.0–0.2)

## 2021-05-24 LAB — GLUCOSE, CAPILLARY
Glucose-Capillary: 197 mg/dL — ABNORMAL HIGH (ref 70–99)
Glucose-Capillary: 155 mg/dL — ABNORMAL HIGH (ref 70–99)
Glucose-Capillary: 124 mg/dL — ABNORMAL HIGH (ref 70–99)
Glucose-Capillary: 110 mg/dL — ABNORMAL HIGH (ref 70–99)
Glucose-Capillary: 62 mg/dL — ABNORMAL LOW (ref 70–99)
Glucose-Capillary: 202 mg/dL — ABNORMAL HIGH (ref 70–99)
Glucose-Capillary: 125 mg/dL — ABNORMAL HIGH (ref 70–99)

## 2021-05-24 LAB — D-DIMER, QUANTITATIVE: D-Dimer, Quant: 1.76 ug/mL-FEU — ABNORMAL HIGH (ref 0.00–0.50)

## 2021-05-24 LAB — FERRITIN: Ferritin: 279 ng/mL (ref 11–307)

## 2021-05-24 LAB — C-REACTIVE PROTEIN: CRP: 7.2 mg/dL — ABNORMAL HIGH (ref <1.0)

## 2021-05-24 MED ORDER — ASCORBIC ACID 500 MG PO TABS: 500.0000 mg | ORAL_TABLET | Freq: Every day | ORAL | 0 refills | Status: AC

## 2021-05-24 MED ORDER — ZINC SULFATE 220 (50 ZN) MG PO CAPS: 220.0000 mg | ORAL_CAPSULE | Freq: Every day | ORAL | 0 refills | Status: AC

## 2021-05-24 MED ORDER — PREDNISONE 50 MG PO TABS
50.0000 mg | ORAL_TABLET | Freq: Every day | ORAL | Status: AC
  Administered 2021-05-24: 50 mg via ORAL
  Filled 2021-05-24: qty 1

## 2021-05-24 MED ORDER — SODIUM BICARBONATE 650 MG PO TABS
1300.0000 mg | ORAL_TABLET | Freq: Four times a day (QID) | ORAL | Status: DC
  Filled 2021-05-24 (×2): qty 2

## 2021-05-24 MED ORDER — SODIUM CHLORIDE 0.9 % IV SOLN
INTRAVENOUS | Status: AC
  Filled 2021-05-24: qty 20

## 2021-05-24 MED ORDER — DEXTROSE IN LACTATED RINGERS 5 % IV SOLN: INTRAVENOUS | Status: DC

## 2021-05-24 MED ORDER — SODIUM CHLORIDE 0.9 % IV SOLN
1.0000 g | Freq: Once | INTRAVENOUS | Status: AC
  Administered 2021-05-24: 1 g via INTRAVENOUS
  Filled 2021-05-24: qty 10

## 2021-05-24 NOTE — Consult Note (Signed)
Palliative Care Consult Note                                  Date: 05/24/2021   Patient Name: Sandra Sheppard  DOB: 05/28/1946  MRN: [id number]  Age / Sex: 75 y.o., female  PCP: Prince Solian, MD Referring Physician: Kayleen Memos, DO  Reason for Consultation: Establishing goals of care  HPI/Patient Profile: Palliative Care consult requested for goals of care discussion in this 75 y.o. female  with past medical history of hypertension, hyperlipidemia, CKD stage IV, diabetes, dementia, and atrial fibrillation. She was admitted on 05/20/2021 from [facility] with hypoxia and COVID-19 positive. Chest x-ray showed showed bibasilar airspace disease suspicious for pneumonia.  Patient was started on remdesivir and broad-spectrum antibiotics.  Past Medical History:  Diagnosis Date   ABDOMINAL AORTIC ANEURYSM    Atrial fibrillation (HCC)    CAROTID STENOSIS    CKD (chronic kidney disease), stage V (HCC)    Complication of anesthesia    Confusion    CVD (cardiovascular disease)    Diabetes ([place])    no meds at present time   FIBRILLATION, ATRIAL    HYPERCHOLESTEROLEMIA  IIA    HYPERTENSION, MILD    MURMUR    Osteoporosis    PERIPHERAL VASCULAR DISEASE    PONV (postoperative nausea and vomiting)    STENOSIS, MITRAL AND AORTIC VALVES      Subjective:   This NP Osborne Oman reviewed medical records, received report from team, assessed the patient and spoke with patient's sister, [name] via phone to discuss diagnosis, prognosis, [place], EOL wishes disposition and options.   Concept of Palliative Care was introduced as specialized medical care for people and their families living with serious illness.  It focuses on providing relief from the symptoms and stress of a serious illness.  The goal is to improve quality of life for both the patient and the family. Values and goals of care important to patient and family were attempted to be  elicited.  [name] shares Ms. Samons has been a resident at [facility] since March 2022. Prior to this she lived in the home alone. She required total assistance at facility.   We discussed Her current illness and what it means in the larger context of Her on-going co-morbidities. Natural disease trajectory and expectations were discussed. Extensive discussions regarding poor appetite and overall functional decline.  Cathy verbalized understanding of current illness and co-morbidities. She is emotional sharing her sister has declined in health over the past year but more significantly over the past few weeks. She does not wish for her to suffer.    We spoke on Jaida's previous quality of life compared to current quality of life and what she would want. Juliann Pulse is clear in expressing her sister would not wish to live in her current condition or to have her life prolonged.   Questions and concerns were addressed.  The family was encouraged to call with questions or concerns.  PMT will continue to support holistically as needed.   Objective:   Primary Diagnoses: Present on Admission:  Pneumonia due to COVID-19 virus  HYPERCHOLESTEROLEMIA  IIA  HYPERTENSION, MILD  AKI (acute kidney injury) ([place])  Metabolic acidosis, increased anion gap  Acute kidney injury superimposed on CKD (HCC)  CKD (chronic kidney disease) stage 4, GFR 15-29 ml/min (HCC)  Acute metabolic encephalopathy  Dementia with behavioral disturbance   Scheduled Meds:  amLODipine  10 mg Oral Daily   vitamin C  500 mg Oral Daily   Chlorhexidine Gluconate Cloth  6 each Topical Daily   heparin  5,000 Units Subcutaneous Q8H   insulin aspart  0-9 Units Subcutaneous Q4H   insulin glargine-yfgn  10 Units Subcutaneous BID   Ipratropium-Albuterol  1 puff Inhalation BID   mouth rinse  15 mL Mouth Rinse BID   QUEtiapine  25 mg Oral QHS   sodium bicarbonate  1,300 mg Oral QID   zinc sulfate  220 mg Oral Daily    Continuous  Infusions:  sodium chloride 10 mL/hr at 05/21/21 1143   lactated ringers Stopped (05/23/21 1444)    PRN Meds: acetaminophen, dextrose, guaiFENesin-dextromethorphan, Ipratropium-Albuterol, lip balm, metoprolol tartrate, ondansetron **OR** ondansetron (ZOFRAN) IV, senna-docusate  Allergies  Allergen Reactions   Ativan [Lorazepam] Other (See Comments)    Confusion   Codeine Other (See Comments)    Rash, Nausea and Vomiting    Sulfonamide Derivatives Other (See Comments)    Unspecified   Penicillins Rash    Has patient had a PCN reaction causing immediate rash, facial/tongue/throat swelling, SOB or lightheadedness with hypotension: YES Has patient had a PCN reaction causing severe rash involving mucus membranes or skin necrosis:NO Has patient had a PCN reaction that required hospitalization NO Has patient had a PCN reaction occurring within the last 10 years: NO If all of the above answers are "NO", then may proceed with Cephalosporin use.    Review of Systems  Unable to perform ROS: Dementia    Physical Exam General: NAD, frail chronically-ill appearing Cardiovascular: regular rate and rhythm Pulmonary: diminished bilaterally  Abdomen: soft, nontender, + bowel sounds Extremities: no edema, no joint deformities Skin: no rashes, warm and dry Neurological: confused, alert to self   Vital Signs:  BP 128/67 (BP Location: Left Arm)   Pulse 78   Temp 98.2 F (36.8 C)   Resp 18   Ht 5\' 5"  (1.651 m)   Wt 50.7 kg   SpO2 96%   BMI 18.60 kg/m  Pain Scale: PAINAD   Pain Score: 0-No pain  SpO2: SpO2: 96 % O2 Device:SpO2: 96 % O2 Flow Rate: .O2 Flow Rate (L/min): 3 L/min  IO: Intake/output summary:  Intake/Output Summary (Last 24 hours) at 05/24/2021 1232 Last data filed at 05/24/2021 5784 Gross per 24 hour  Intake 202 ml  Output 1025 ml  Net -823 ml    LBM: Last BM Date: 05/22/21 Baseline Weight: Weight: 53.5 kg Most recent weight: Weight: 50.7 kg      Palliative  Assessment/Data: PPS 20%   Advanced Care Planning:   Primary Decision Maker: HCPOA-sister [name]   Code Status/Advance Care Planning: DNR  A discussion was had today regarding advanced directives. Concepts specific to code status, artifical feeding and hydration, continued IV antibiotics and rehospitalization was had.    The difference between a aggressive medical intervention path and a palliative comfort care path was discussed.   [name] is clear in expressed wishes to continue to treat with main focus on comfort. No escalation of care, no artificial feedings, no escalation of care. Ok to minimize medications.   Hospice services outpatient were explained and offered. [name] verbalized their understanding and awareness of hospice's goals and philosophy of care. She is requesting consideration for hospice support to allow patient to be comfortable and have symptoms appropriately managed for what time she has left. She does not wish for her to return back to [facility] if at  all possible but for consideration of residential hospice.    Assessment & Plan:   SUMMARY OF RECOMMENDATIONS   DNR/DNI-as confirmed by sister/POA Comfort focused care, no escalation, no artificial feedings, symptom management  Sister/POA [name] is requesting residential hospice consideration. She understands [facility] does not take COVID positive patients and is in agreement with [facility].  Morphine as needed for pain/air hunger  PMT will continue to support and follow as needed. Please call team line with urgent needs.   Palliative Prophylaxis:  Aspiration, Bowel Regimen, Delirium Protocol, Frequent Pain Assessment, and Oral Care  Additional Recommendations (Limitations, Scope, Preferences): Comfort focused, no escalation   Psycho-social/Spiritual:  Desire for further Chaplaincy support: no Additional Recommendations: Education on Hospice  Prognosis:  < 2 weeks (days)  Discharge Planning:   Hospice facility   Discussed with: Dr. Edmonia Caprio expressed understanding and was in agreement with this plan.   Time Total: 55 min.   Visit consisted of counseling and education dealing with the complex and emotionally intense issues of symptom management and palliative care in the setting of serious and potentially life-threatening illness.Greater than 50%  of this time was spent counseling and coordinating care related to the above assessment and plan.  Signed by:  Alda Lea, AGPCNP-BC Palliative Medicine Team  Phone: [phone] Pager: [phone] Amion: Bjorn Pippin   Thank you for allowing the Palliative Medicine Team to assist in the care of this patient. Please utilize secure chat with additional questions, if there is no response within 30 minutes please call the above phone number. Palliative Medicine Team providers are available by phone from 7am to 5pm daily and can be reached through the team cell phone.  Should this patient require assistance outside of these hours, please call the patient's attending physician.

## 2021-05-24 NOTE — Progress Notes (Signed)
   Referral received from Case management for Hospice Home in [facility]. Pt chart was reviewed and I spoke to the pt's sister who was not at bedside over the phone regarding pt care, goals and hospice services. She is in agreement and would like to proceed with referral process. Our MD does agree pt is appropriate for the Hospice Home in [facility] and pt's sister [name] has accepted bed offer. We will work on getting paperwork and ambulance arranged in am after the bed meeting for [facility] in [facility] and I confirm there is an available bed to offer.   Webb Silversmith RN [phone]

## 2021-05-24 NOTE — NC FL2 (Signed)
[place] LEVEL OF CARE SCREENING TOOL     IDENTIFICATION  Patient Name: Sandra Sheppard Birthdate: 12-28-1945 Sex: female Admission Date (Current Location): 05/20/2021  [facility] and [place] Number:  Herbalist and Address:  [facility],  [place], [place]      Provider Number: [id number]  Attending Physician Name and Address:  Kayleen Memos, DO  Relative Name and Phone Number:  Mart Piggs [phone]  [phone], Yoffe,Annette Sister [phone]    Current Level of Care: Hospital Recommended Level of Care: [place] Prior Approval Number:    Date Approved/Denied:   PASRR Number: [id number]  Discharge Plan: SNF    Current Diagnoses: Patient Active Problem List   Diagnosis Date Noted   Pressure injury of skin 05/21/2021   Pneumonia due to COVID-19 virus 05/20/2021   UTI (urinary tract infection) 03/17/2021   Fall 03/17/2021   Closed fracture of right toe 03/17/2021   Dementia with behavioral disturbance 41/42/3953   Acute metabolic encephalopathy 20/23/3435   Syncope and collapse 08/17/2020   Protein-calorie malnutrition, severe 08/17/2020   Near syncope 08/14/2020   CKD (chronic kidney disease) stage 4, GFR 15-29 ml/min ([place]) 08/13/2020   Tobacco abuse 09/22/2019   Educated about COVID-19 virus infection 09/22/2019   Asymptomatic bilateral carotid artery stenosis 09/22/2019   Acute kidney injury superimposed on CKD [facility])    Palliative care encounter    Hypocalcemia    FTT (failure to thrive) in adult    AKI (acute kidney injury) ([place]) 08/08/2018   Hyperkalemia 68/61/6837   Metabolic acidosis, increased anion gap 08/08/2018   Normocytic anemia 08/08/2018   Diabetes mellitus type II, non insulin dependent ([place]) 08/08/2018   Bilateral carotid artery disease ([place]) 07/12/2018   Dyslipidemia 07/12/2018   Vomiting 07/04/2016   Gastroenteritis 07/04/2016   CKD (chronic kidney disease)  stage 3, GFR 30-59 ml/min (HCC) 07/04/2016   Occlusion and stenosis of carotid artery 04/28/2010   ABDOMINAL AORTIC ANEURYSM 04/28/2010   Peripheral vascular disease ([place]) 12/08/2008   STENOSIS, MITRAL AND AORTIC VALVES 10/07/2008   Paroxysmal atrial fibrillation ([place]) 10/07/2008   H/O aortic valve replacement 08/14/2008   HYPERCHOLESTEROLEMIA  IIA 06/23/2008   HYPERTENSION, MILD 06/23/2008   MURMUR 06/23/2008    Orientation RESPIRATION BLADDER Height & Weight      (Disoriented)  O2 (3L O2 [place]) Incontinent Weight: 50.7 kg Height:  5\' 5"  (165.1 cm)  BEHAVIORAL SYMPTOMS/MOOD NEUROLOGICAL BOWEL NUTRITION STATUS     (Disoriented) Incontinent Diet (FULL LIQUID)  AMBULATORY STATUS COMMUNICATION OF NEEDS Skin   Total Care Verbally Other (Comment) ([place], buttock, left and right heels,)                       Personal Care Assistance Level of Assistance  Bathing, Feeding, Dressing, Total care Bathing Assistance: Maximum assistance Feeding assistance: Maximum assistance Dressing Assistance: Maximum assistance Total Care Assistance: Maximum assistance   Functional Limitations Info  Sight, Hearing, Speech Sight Info: Impaired Hearing Info: Adequate Speech Info: Adequate    SPECIAL CARE FACTORS FREQUENCY  PT (By licensed PT), OT (By licensed OT)     PT Frequency: Eval and treat OT Frequency: Eval and treat            Contractures Contractures Info: Not present    Additional Factors Info  Code Status, Allergies Code Status Info: DNR Allergies Info: Ativan (Lorazepam), Codeine, Sulfonamide Derivatives, Penicillins  Current Medications (05/24/2021):  This is the current hospital active medication list Current Facility-Administered Medications  Medication Dose Route Frequency Provider Last Rate Last Admin   0.9 %  sodium chloride infusion   Intravenous Continuous Alma Friendly, MD 10 mL/hr at 05/21/21 1143 New Bag at 05/21/21 1143   acetaminophen  (TYLENOL) tablet 650 mg  650 mg Oral Q6H PRN Alma Friendly, MD       amLODipine (NORVASC) tablet 10 mg  10 mg Oral Daily Aarilyn, Dye N, DO   10 mg at 05/23/21 0945   ascorbic acid (VITAMIN C) tablet 500 mg  500 mg Oral Daily Alma Friendly, MD   500 mg at 05/23/21 0945   Chlorhexidine Gluconate Cloth 2 % PADS 6 each  6 each Topical Daily Alma Friendly, MD   6 each at 05/23/21 1040   dextrose 50 % solution 0-50 mL  0-50 mL Intravenous PRN Alma Friendly, MD       guaiFENesin-dextromethorphan (ROBITUSSIN DM) 100-10 MG/5ML syrup 10 mL  10 mL Oral Q4H PRN Alma Friendly, MD       heparin injection 5,000 Units  5,000 Units Subcutaneous Q8H Alma Friendly, MD   5,000 Units at 05/24/21 1419   insulin aspart (novoLOG) injection 0-9 Units  0-9 Units Subcutaneous Q4H Alma Friendly, MD   1 Units at 05/24/21 0816   insulin glargine-yfgn (SEMGLEE) injection 10 Units  10 Units Subcutaneous BID Joselyn, Edling, DO   10 Units at 05/24/21 2952   Ipratropium-Albuterol (COMBIVENT) respimat 1 puff  1 puff Inhalation Q6H PRN Alma Friendly, MD       Ipratropium-Albuterol (COMBIVENT) respimat 1 puff  1 puff Inhalation BID Alma Friendly, MD   1 puff at 05/23/21 2100   lactated ringers infusion   Intravenous Continuous Irene Pap N, DO   Stopped at 05/23/21 1444   lip balm (CARMEX) ointment   Topical PRN Alma Friendly, MD       MEDLINE mouth rinse  15 mL Mouth Rinse BID Alma Friendly, MD   15 mL at 05/24/21 0817   metoprolol tartrate (LOPRESSOR) injection 2.5 mg  2.5 mg Intravenous Q6H PRN Irene Pap N, DO   2.5 mg at 05/22/21 1753   ondansetron (ZOFRAN) tablet 4 mg  4 mg Oral Q6H PRN Alma Friendly, MD       Or   ondansetron [facility]) injection 4 mg  4 mg Intravenous Q6H PRN Alma Friendly, MD       QUEtiapine (SEROQUEL) tablet 25 mg  25 mg Oral QHS Hall, Carole N, DO   25 mg at 05/23/21 2059   senna-docusate (Senokot-S) tablet 1  tablet  1 tablet Oral QHS PRN Alma Friendly, MD       sodium bicarbonate tablet 1,300 mg  1,300 mg Oral QID Hall, Carole N, DO       zinc sulfate capsule 220 mg  220 mg Oral Daily Alma Friendly, MD   220 mg at 05/23/21 0945     Discharge Medications: Please see discharge summary for a list of discharge medications.  Relevant Imaging Results:  Relevant Lab Results:   Additional Information [phone]  Purcell Mouton, RN

## 2021-05-24 NOTE — TOC Progression Note (Signed)
Transition of Care [facility]) - Progression Note    Patient Details  Name: Sandra Sheppard MRN: [id number] Date of Birth: 1945/11/14  [facility]) CM/SW Contact  Purcell Mouton, RN Phone Number: 05/24/2021, 3:34 PM  Clinical Narrative:     Spoke with pt's sisters and they are asking for [facility]. Referral given to in house rep.  Expected Discharge Plan: [place] Barriers to Discharge: No Barriers Identified  Expected Discharge Plan and Services Expected Discharge Plan: [place]         Expected Discharge Date: 05/24/21                                     Social Determinants of Health (SDOH) Interventions    Readmission Risk Interventions No flowsheet data found.

## 2021-05-24 NOTE — Discharge Summary (Signed)
Discharge Summary  Sandra Sheppard TFT:[id number] DOB: Jun 10, 1946  PCP: Prince Solian, MD  Admit date: 05/20/2021 Discharge date: 05/24/2021  Time spent: 35-minutes  Recommendations for Outpatient Follow-up:  Continue with hospice care.  Discharge Diagnoses:  Active Hospital Problems   Diagnosis Date Noted   Pneumonia due to COVID-19 virus 05/20/2021   Pressure injury of skin 70/62/3762   Acute metabolic encephalopathy 83/15/1761   Dementia with behavioral disturbance 03/17/2021   CKD (chronic kidney disease) stage 4, GFR 15-29 ml/min (HCC) 08/13/2020   Acute kidney injury superimposed on CKD ([place])    AKI (acute kidney injury) ([place]) 60/73/7106   Metabolic acidosis, increased anion gap 08/08/2018   Diabetes mellitus type II, non insulin dependent ([place]) 08/08/2018   H/O aortic valve replacement 08/14/2008   HYPERCHOLESTEROLEMIA  IIA 06/23/2008   HYPERTENSION, MILD 06/23/2008    Resolved Hospital Problems  No resolved problems to display.    Discharge Condition: Stable  Diet recommendation: Per speech therapist recommendations:  Aspiration Risk   Moderate aspiration risk;Mild aspiration risk;Risk for inadequate nutrition/hydration     Diet Recommendation Thin liquid;Other (Comment) (continue with full liquids)    Liquid Administration via: Cup Medication Administration: Crushed with puree Supervision: Full supervision/cueing for compensatory strategies;Staff to assist with self feeding Compensations: Slow rate;Small sips/bites;Other (Comment) (check for pocketing) Postural Changes: Seated upright at 90 degrees     Other  Recommendations Oral Care Recommendations: Oral care BID;Staff/trained caregiver to provide oral care;Oral care before and after PO       Vitals:   05/24/21 0336 05/24/21 1338  BP: 128/67 137/63  Pulse: 78 90  Resp: 18 (!) 22  Temp: 98.2 F (36.8 C) 98.1 F (36.7 C)  SpO2: 96% 95%    History of present illness:  Sandra Sheppard is a 75  y.o. female with medical history significant for hypertension, hyperlipidemia, CKD stage IV, diabetes mellitus type 2, aortic valve replacement, advanced dementia, presenting from SNF [facility] with complaints of hypoxia, fever, COVID-positive, and hyperglycemia.  Admitted with COVID-19 viral pneumonia with concern for bacterial pneumonia.  05/24/21: Patient was seen and examined at bedside.  No acute events overnight.  She has no new complaint.  She is minimally interactive.    Hospital Course:  Principal Problem:   Pneumonia due to COVID-19 virus Active Problems:   HYPERCHOLESTEROLEMIA  IIA   HYPERTENSION, MILD   H/O aortic valve replacement   AKI (acute kidney injury) (HCC)   Metabolic acidosis, increased anion gap   Diabetes mellitus type II, non insulin dependent (HCC)   Acute kidney injury superimposed on CKD (HCC)   CKD (chronic kidney disease) stage 4, GFR 15-29 ml/min (HCC)   Dementia with behavioral disturbance   Acute metabolic encephalopathy   Pressure injury of skin  Sepsis, resolved, likely 2/2 pneumonia due to COVID-19 infection with concern for bacterial pulmonary infection, E. coli UTI, POA On admission, required 4L of O2 , currently on RA satting around 94%  CXR B/L bibasilar airspace, suspicious  for pneumonia Procalcitonin <0.10 on 05/21/21 from 2.35 on 05/20/21 On presentation, was tachycardic, tachypneic, with marked leukocytosis Currently afebrile with no leukocytosis WBC 9.0 from 18.7. Lactic acid 1.7 from 4.6 Completed IV remdesivir and IV Solu-Medrol. Completed IV antibiotics, last dose on 05/24/2021.   Resolving AKI on CKD stage IV Likely 2/2 severe hyperglycemia, sepsis, poor oral intake, marked dehydration Baseline creatinine around 2.2, on presentation 4.02 Creatinine downtrending 2.80 from 3.23. Continue strict I's and O's, avoid nephrotoxins Family not interested in dialysis  if renal fxn significantly declines Daily CMP Appreciate nephrology's  assistance, signed off. Continue gentle IV fluid hydration LR 50 cc/h x 2 days. Continue to closely monitor urine output.  Anion gap metabolic acidosis secondary to acute renal failure  Anion gap 11 HCO3-18 Continue po sodium bicarb 1300 mg QID  BMP   E.coli UTI, POA UA positive for infection, UC growing E. Coli Rocephin 05/23/2021.   Essential hypertension, BP is not at goal BP is elevated Continue home Norvasc 10 mg daily.   Resolved acute hypoxic respiratory failure She is currently on room air, 92%. Continue to monitor, oxygen supplementation as needed.   Type 2 diabetes mellitus with hyperglycemia Possible early DKA A1c 9.9 Not on any medication prior to arrival Glucose 546, bicarb 19, anion gap 13, beta hydroxybutyric acid 1.09 S/p Endotool insulin drip for severe hyperglycemia as patient altered and npo Switched to SSI Q4H, glargine, until able to tolerate orally Continue IV fluids Monitor closely   Acute metabolic encephalopathy Likely multifactorial from COVID-19 infection, severe hyperglycemia, UTI Management as above Reorient as needed Resume home Seroquel nightly   Resolved post repletion: Hyponatremia/hyperkalemia   Anemia of chronic kidney disease Hemoglobin with drop likely 2/2 hemodilution Daily CBC Hemoglobin stable at this time.   Elevated D-dimer Likely in the setting of COVID infection Unable to perform CTA chest due to CKD Lower extremity Doppler negative for DVT May need VQ scan pending clinical status, and if D-dimer is rising significantly   Hypertension BP soft Hold home BP meds for now   Paroxysmal A. Fib Currently in sinus rhythm, rate controlled Not a good candidate for anticoagulation as per family, hx of multiple falls Not on any rate controlling medication   History of CVA Continue with aspirin, statins once able   Dementia with behavioral disturbance and hallucination Currently altered, at baseline only oriented to  self Hold PTA Lexapro, Depakote, Seroquel for now until able to tolerate orally or more awake   [place] Patient with very poor prognosis, currently DNR Follows with hospice at SNF.   Physical debility PT OT to assess Continue fall precautions.       Estimated body mass index is 18.6 kg/m as calculated from the following:   Height as of this encounter: 5\' 5"  (1.651 m).   Weight as of this encounter: 50.7 kg.      Code Status: DNR     Consultants: Nephrology   Procedures: None   Antimicrobials: Cefepime   Procedures: None    Discharge Exam: BP 137/63 (BP Location: Left Arm)   Pulse 90   Temp 98.1 F (36.7 C) (Oral)   Resp (!) 22   Ht 5\' 5"  (1.651 m)   Wt 50.7 kg   SpO2 95%   BMI 18.60 kg/m  General: 75 y.o. year-old female Frail in no acute distress.  Alert and minimally interactive. Cardiovascular: Regular rate and rhythm with no rubs or gallops.  No thyromegaly or JVD noted.   Respiratory: Clear to auscultation with no wheezes or rales. Good inspiratory effort. Abdomen: Soft nontender nondistended with normal bowel sounds x4 quadrants. Musculoskeletal: Trace lower extremity edema bilaterally. Psychiatry: Mood is appropriate for condition and setting  Discharge Instructions You were cared for by a hospitalist during your hospital stay. If you have any questions about your discharge medications or the care you received while you were in the hospital after you are discharged, you can call the unit and asked to speak with the hospitalist on call if the  hospitalist that took care of you is not available. Once you are discharged, your primary care physician will handle any further medical issues. Please note that NO REFILLS for any discharge medications will be authorized once you are discharged, as it is imperative that you return to your primary care physician (or establish a relationship with a primary care physician if you do not have one) for your aftercare needs so  that they can reassess your need for medications and monitor your lab values.   Allergies as of 05/24/2021       Reactions   Ativan [lorazepam] Other (See Comments)   Confusion   Codeine Other (See Comments)   Rash, Nausea and Vomiting    Sulfonamide Derivatives Other (See Comments)   Unspecified   Penicillins Rash   Has patient had a PCN reaction causing immediate rash, facial/tongue/throat swelling, SOB or lightheadedness with hypotension: YES Has patient had a PCN reaction causing severe rash involving mucus membranes or skin necrosis:NO Has patient had a PCN reaction that required hospitalization NO Has patient had a PCN reaction occurring within the last 10 years: NO If all of the above answers are "NO", then may proceed with Cephalosporin use.        Medication List     TAKE these medications    acetaminophen 325 MG tablet Commonly known as: TYLENOL Take 650 mg by mouth every 4 (four) hours as needed for mild pain. Do not exceed 3,000 mg in 24 hrs   amLODipine 10 MG tablet Commonly known as: NORVASC Take 1 tablet (10 mg total) by mouth daily.   ascorbic acid 500 MG tablet Commonly known as: VITAMIN C Take 1 tablet (500 mg total) by mouth daily. Start taking on: May 25, 2021   aspirin 81 MG tablet Take 81 mg by mouth daily. 0900   Choline Fenofibrate 45 MG capsule Take 45 mg by mouth daily. 0900   cyanocobalamin 1000 MCG tablet Take 1 tablet (1,000 mcg total) by mouth daily. What changed: additional instructions   divalproex 125 MG DR tablet Commonly known as: DEPAKOTE Take 125 mg by mouth 2 (two) times daily. 0900, 1700   docusate sodium 100 MG capsule Commonly known as: COLACE Take 1 capsule (100 mg total) by mouth 2 (two) times daily. What changed: additional instructions   escitalopram 5 MG tablet Commonly known as: LEXAPRO Take 5 mg by mouth daily. 0900   loratadine 10 MG tablet Commonly known as: CLARITIN Take 10 mg by mouth daily as  needed for allergies.   meclizine 12.5 MG tablet Commonly known as: ANTIVERT Take 12.5 mg by mouth 3 (three) times daily as needed for dizziness.   Melatonin 10 MG Tabs Take 10 mg by mouth at bedtime. 2100   morphine CONCENTRATE 10 mg / 0.5 ml concentrated solution Take 5 mg by mouth every hour as needed for severe pain or shortness of breath.   multivitamin capsule Take 1 capsule by mouth daily. 0900   OVER THE COUNTER MEDICATION Take 237 mLs by mouth 3 (three) times daily between meals. House Supplement for nutritional support. 0900, 1400, 2100.   PreserVision AREDS 2+Multi Vit Caps Take 1 capsule by mouth daily at 6 (six) AM. 0900   QUEtiapine 25 MG tablet Commonly known as: SEROQUEL Take 1 tablet (25 mg total) by mouth at bedtime. What changed: additional instructions   rosuvastatin 5 MG tablet Commonly known as: CRESTOR Take 5 mg by mouth daily. 0900   senna-docusate 8.6-50 MG tablet Commonly  known as: Senokot-S Take 1 tablet by mouth at bedtime. What changed: additional instructions   sodium bicarbonate 650 MG tablet Take 1 tablet (650 mg total) by mouth 3 (three) times daily.   Vitamin D-3 25 MCG (1000 UT) Caps Take 2,000 Units by mouth daily. 0900   zinc sulfate 220 (50 Zn) MG capsule Take 1 capsule (220 mg total) by mouth daily. Start taking on: May 25, 2021   zoledronic acid 5 MG/100ML Soln injection Commonly known as: RECLAST Inject 5 mg into the vein See admin instructions. Once a year       Allergies  Allergen Reactions   Ativan [Lorazepam] Other (See Comments)    Confusion   Codeine Other (See Comments)    Rash, Nausea and Vomiting    Sulfonamide Derivatives Other (See Comments)    Unspecified   Penicillins Rash    Has patient had a PCN reaction causing immediate rash, facial/tongue/throat swelling, SOB or lightheadedness with hypotension: YES Has patient had a PCN reaction causing severe rash involving mucus membranes or skin  necrosis:NO Has patient had a PCN reaction that required hospitalization NO Has patient had a PCN reaction occurring within the last 10 years: NO If all of the above answers are "NO", then may proceed with Cephalosporin use.      The results of significant diagnostics from this hospitalization (including imaging, microbiology, ancillary and laboratory) are listed below for reference.    Significant Diagnostic Studies: CT HEAD WO CONTRAST (5MM)  Result Date: 05/03/2021 CLINICAL DATA:  Neck trauma. EXAM: CT HEAD WITHOUT CONTRAST CT CERVICAL SPINE WITHOUT CONTRAST TECHNIQUE: Multidetector CT imaging of the head and cervical spine was performed following the standard protocol without intravenous contrast. Multiplanar CT image reconstructions of the cervical spine were also generated. COMPARISON:  CT dated 03/17/2021. FINDINGS: CT HEAD FINDINGS Brain: Moderate age-related atrophy and chronic microvascular ischemic changes. There is no acute intracranial hemorrhage. No mass effect or midline shift. No extra-axial fluid collection. Vascular: No hyperdense vessel or unexpected calcification. Skull: Normal. Negative for fracture or focal lesion. Sinuses/Orbits: No acute finding. Other: None CT CERVICAL SPINE FINDINGS Alignment: No acute subluxation. Skull base and vertebrae: No acute fracture. Soft tissues and spinal canal: No prevertebral fluid or swelling. No visible canal hematoma. Disc levels:  Degenerative changes primarily at C4-C5 and C5-C6. Upper chest: Emphysema. Other: Bilateral carotid bulb calcified plaques. IMPRESSION: 1. No acute intracranial pathology. Moderate age-related atrophy and chronic microvascular ischemic changes. 2. No acute/traumatic cervical spine pathology. Electronically Signed   By: Anner Crete M.D.   On: 05/03/2021 21:44   CT Cervical Spine Wo Contrast  Result Date: 05/03/2021 CLINICAL DATA:  Neck trauma. EXAM: CT HEAD WITHOUT CONTRAST CT CERVICAL SPINE WITHOUT CONTRAST  TECHNIQUE: Multidetector CT imaging of the head and cervical spine was performed following the standard protocol without intravenous contrast. Multiplanar CT image reconstructions of the cervical spine were also generated. COMPARISON:  CT dated 03/17/2021. FINDINGS: CT HEAD FINDINGS Brain: Moderate age-related atrophy and chronic microvascular ischemic changes. There is no acute intracranial hemorrhage. No mass effect or midline shift. No extra-axial fluid collection. Vascular: No hyperdense vessel or unexpected calcification. Skull: Normal. Negative for fracture or focal lesion. Sinuses/Orbits: No acute finding. Other: None CT CERVICAL SPINE FINDINGS Alignment: No acute subluxation. Skull base and vertebrae: No acute fracture. Soft tissues and spinal canal: No prevertebral fluid or swelling. No visible canal hematoma. Disc levels:  Degenerative changes primarily at C4-C5 and C5-C6. Upper chest: Emphysema. Other: Bilateral carotid bulb calcified  plaques. IMPRESSION: 1. No acute intracranial pathology. Moderate age-related atrophy and chronic microvascular ischemic changes. 2. No acute/traumatic cervical spine pathology. Electronically Signed   By: Anner Crete M.D.   On: 05/03/2021 21:44   US RENAL  Result Date: 05/20/2021 CLINICAL DATA:  Acute kidney injury. EXAM: RENAL / URINARY TRACT ULTRASOUND COMPLETE COMPARISON:  08/08/2018 FINDINGS: Right Kidney: Renal measurements: 9.5 by 3.7 by 4.7 cm = volume: 86 mL. Echogenicity within normal limits. No solid mass or hydronephrosis visualized. Two right renal cysts are present, 11.1 cm in diameter in the other 1.0 cm in diameter. Left Kidney: Renal measurements: 8.9 by 4.6 by 3.3 cm = volume: 70 mL. Echogenicity within normal limits. No solid mass or hydronephrosis visualized. A left renal cyst measures 1.6 by 1.4 by 1.5 cm. Bladder: Irregularity posteriorly in the urinary bladder nonspecific but could be secondary to nondistention. Other: Cholelithiasis noted.  IMPRESSION: 1. Relatively small kidneys favoring renal atrophy. 2. Small bilateral renal cysts. 3. Irregularity posteriorly in the urinary bladder is nonspecific but possibly secondary to nondistention. 4. Cholelithiasis. Electronically Signed   By: Van Clines M.D.   On: 05/20/2021 20:36   DG Chest Port 1 View  Result Date: 05/20/2021 CLINICAL DATA:  Hypoxia.  COVID-19 positive. EXAM: PORTABLE CHEST 1 VIEW COMPARISON:  03/17/2021 FINDINGS: Prior median sternotomy. Numerous leads and wires project over the chest. Midline trachea. Normal heart size. Atherosclerosis in the transverse aorta. No pleural effusion or pneumothorax. Airspace disease in both medial lower lobes. IMPRESSION: Bibasilar airspace disease, suspicious for pneumonia. Aortic Atherosclerosis (ICD10-I70.0). Electronically Signed   By: Abigail Miyamoto M.D.   On: 05/20/2021 14:08   VAS [place] LOWER EXTREMITY VENOUS (DVT)  Result Date: 05/20/2021  Lower Venous DVT Study Patient Name:  EVERLYNN SAGUN  Date of Exam:   05/20/2021 Medical Rec #: [id number]       Accession #:    [id number] Date of Birth: May 29, 1946        Patient Gender: F Patient Age:   74 years Exam Location:  [facility] Procedure:      VAS [place] LOWER EXTREMITY VENOUS (DVT) Referring Phys: [facility] EZENDUKA --------------------------------------------------------------------------------  Indications: Edema.  Risk Factors: COVID 19 positive. Limitations: Poor ultrasound/tissue interface and patient positioning, patient movement, poor patient cooperation. Comparison Study: No prior studies. Performing Technologist: Oliver Hum RVT  Examination Guidelines: A complete evaluation includes B-mode imaging, spectral Doppler, color Doppler, and power Doppler as needed of all accessible portions of each vessel. Bilateral testing is considered an integral part of a complete examination. Limited examinations for reoccurring indications may be performed as noted. The reflux portion of the  exam is performed with the patient in reverse Trendelenburg.  +---------+---------------+---------+-----------+----------+--------------+ RIGHT    CompressibilityPhasicitySpontaneityPropertiesThrombus Aging +---------+---------------+---------+-----------+----------+--------------+ CFV      Full           Yes      Yes                                 +---------+---------------+---------+-----------+----------+--------------+ SFJ      Full                                                        +---------+---------------+---------+-----------+----------+--------------+ FV Prox  Full                                                        +---------+---------------+---------+-----------+----------+--------------+  FV Mid   Full                                                        +---------+---------------+---------+-----------+----------+--------------+ FV Distal               Yes      Yes                                 +---------+---------------+---------+-----------+----------+--------------+ PFV      Full                                                        +---------+---------------+---------+-----------+----------+--------------+ POP      Full           Yes      Yes                                 +---------+---------------+---------+-----------+----------+--------------+ PTV      Full                                                        +---------+---------------+---------+-----------+----------+--------------+ PERO     Full                                                        +---------+---------------+---------+-----------+----------+--------------+   +---------+---------------+---------+-----------+----------+--------------+ LEFT     CompressibilityPhasicitySpontaneityPropertiesThrombus Aging +---------+---------------+---------+-----------+----------+--------------+ CFV      Full           Yes      Yes                                  +---------+---------------+---------+-----------+----------+--------------+ SFJ      Full                                                        +---------+---------------+---------+-----------+----------+--------------+ FV Prox  Full                                                        +---------+---------------+---------+-----------+----------+--------------+ FV Mid   Full                                                        +---------+---------------+---------+-----------+----------+--------------+  FV DistalFull                                                        +---------+---------------+---------+-----------+----------+--------------+ PFV      Full                                                        +---------+---------------+---------+-----------+----------+--------------+ POP      Full           Yes      Yes                                 +---------+---------------+---------+-----------+----------+--------------+ PTV      Full                                                        +---------+---------------+---------+-----------+----------+--------------+ PERO     Full                                                        +---------+---------------+---------+-----------+----------+--------------+     Summary: RIGHT: - There is no evidence of deep vein thrombosis in the lower extremity. However, portions of this examination were limited- see technologist comments above.  - No cystic structure found in the popliteal fossa.  LEFT: - There is no evidence of deep vein thrombosis in the lower extremity. However, portions of this examination were limited- see technologist comments above.  - No cystic structure found in the popliteal fossa.  *See table(s) above for measurements and observations. Electronically signed by Orlie Pollen on 05/20/2021 at 4:58:59 PM.    Final     Microbiology: Recent Results (from the past 240 hour(s))   Resp Panel by RT-PCR (Flu A&B, Covid) Nasopharyngeal Swab     Status: Abnormal   Collection Time: 05/20/21  1:15 PM   Specimen: Nasopharyngeal Swab; Nasopharyngeal(NP) swabs in vial transport medium  Result Value Ref Range Status   SARS Coronavirus 2 by RT PCR POSITIVE (A) NEGATIVE Final    Comment: RESULT CALLED TO, READ BACK BY AND VERIFIED WITH: BRATU,D. EMTP AT 1434 05/20/21 MULLINS,T (NOTE) SARS-CoV-2 target nucleic acids are DETECTED.  The SARS-CoV-2 RNA is generally detectable in upper respiratory specimens during the acute phase of infection. Positive results are indicative of the presence of the identified virus, but do not rule out bacterial infection or co-infection with other pathogens not detected by the test. Clinical correlation with patient history and other diagnostic information is necessary to determine patient infection status. The expected result is Negative.  Fact Sheet for Patients: EntrepreneurPulse.com.au  Fact Sheet for Healthcare Providers: IncredibleEmployment.be  This test is not yet approved or cleared by the [place] FDA and  has been authorized for detection and/or diagnosis of SARS-CoV-2 by FDA under  an Emergency Use Authorization (EUA).  This EUA will remain in effect (meaning this test  can be used) for the duration of  the COVID-19 declaration under Section 564(b)(1) of the Act, 21 U.S.C. section 360bbb-3(b)(1), unless the authorization is terminated or revoked sooner.     Influenza A by PCR NEGATIVE NEGATIVE Final   Influenza B by PCR NEGATIVE NEGATIVE Final    Comment: (NOTE) The Xpert Xpress SARS-CoV-2/FLU/RSV plus assay is intended as an aid in the diagnosis of influenza from Nasopharyngeal swab specimens and should not be used as a sole basis for treatment. Nasal washings and aspirates are unacceptable for Xpert Xpress SARS-CoV-2/FLU/RSV testing.  Fact Sheet for  Patients: EntrepreneurPulse.com.au  Fact Sheet for Healthcare Providers: IncredibleEmployment.be  This test is not yet approved or cleared by the [place] FDA and has been authorized for detection and/or diagnosis of SARS-CoV-2 by FDA under an Emergency Use Authorization (EUA). This EUA will remain in effect (meaning this test can be used) for the duration of the COVID-19 declaration under Section 564(b)(1) of the Act, 21 U.S.C. section 360bbb-3(b)(1), unless the authorization is terminated or revoked.  Performed at [facility], [place] [address] [place], [place] [address]   Blood Culture (routine x 2)     Status: None (Preliminary result)   Collection Time: 05/20/21  1:15 PM   Specimen: BLOOD  Result Value Ref Range Status   Specimen Description   Final    BLOOD BLOOD LEFT HAND Performed at [facility] [address] [place], [place] [address]    Special Requests   Final    BOTTLES DRAWN AEROBIC AND ANAEROBIC Blood Culture adequate volume Performed at [facility] [address] [place], [place] [address]    Culture   Final    NO GROWTH 4 DAYS Performed at [facility], [place] [address], [place], [place] [address]    Report Status PENDING  Incomplete  Blood Culture (routine x 2)     Status: None (Preliminary result)   Collection Time: 05/20/21  1:28 PM   Specimen: BLOOD  Result Value Ref Range Status   Specimen Description   Final    BLOOD BLOOD RIGHT HAND Performed at [facility] [address], [place], [place] [address]    Special Requests   Final    BOTTLES DRAWN AEROBIC AND ANAEROBIC Blood Culture adequate volume Performed at [facility] [address], [place], [place] [address]    Culture   Final    NO GROWTH 4 DAYS Performed at [facility], [place] [address], [place], [place] [address]    Report Status PENDING   Incomplete  Urine Culture     Status: Abnormal   Collection Time: 05/20/21  3:39 PM   Specimen: In/Out Cath Urine  Result Value Ref Range Status   Specimen Description   Final    IN/OUT CATH URINE Performed at [facility] [address], [place],  [address]    Special Requests   Final    NONE Performed at [facility], [place] [address], [place], [place] [address]    Culture >=100,000 COLONIES/mL ESCHERICHIA COLI (A)  Final   Report Status 05/22/2021 FINAL  Final   Organism ID, Bacteria ESCHERICHIA COLI (A)  Final      Susceptibility   Escherichia coli - MIC*    AMPICILLIN 4 SENSITIVE Sensitive     CEFAZOLIN <=4 SENSITIVE Sensitive  CEFEPIME <=0.12 SENSITIVE Sensitive     CEFTRIAXONE <=0.25 SENSITIVE Sensitive     CIPROFLOXACIN 0.5 INTERMEDIATE Intermediate     GENTAMICIN <=1 SENSITIVE Sensitive     IMIPENEM <=0.25 SENSITIVE Sensitive     NITROFURANTOIN <=16 SENSITIVE Sensitive     TRIMETH/SULFA >=320 RESISTANT Resistant     AMPICILLIN/SULBACTAM <=2 SENSITIVE Sensitive     PIP/TAZO <=4 SENSITIVE Sensitive     * >=100,000 COLONIES/mL ESCHERICHIA COLI  MRSA Next Gen by PCR, Nasal     Status: None   Collection Time: 05/21/21 12:30 AM   Specimen: Nasal Mucosa; Nasal Swab  Result Value Ref Range Status   MRSA by PCR Next Gen NOT DETECTED NOT DETECTED Final    Comment: (NOTE) The GeneXpert MRSA Assay (FDA approved for NASAL specimens only), is one component of a comprehensive MRSA colonization surveillance program. It is not intended to diagnose MRSA infection nor to guide or monitor treatment for MRSA infections. Test performance is not FDA approved in patients less than 30 years old. Performed at [facility], [place] [address], [place], [place] [address]      Labs: Basic Metabolic Panel: Recent Labs  Lab 05/21/21 0242 05/21/21 0843 05/22/21 0308 05/23/21 0312 05/24/21 0417  NA 137 137 138 140 141  K  4.5 4.2 4.9 4.1 4.3  CL 106 107 109 112* 112*  CO2 17* 18* 19* 19* 18*  GLUCOSE 192* 110* 270* 231* 177*  BUN 79* 79* 69* 69* 63*  CREATININE 3.68* 3.59* 3.23* 2.80* 2.73*  CALCIUM 8.2* 8.4* 8.4* 8.2* 8.4*  MG  --  2.0  --   --   --    Liver Function Tests: Recent Labs  Lab 05/20/21 1328 05/22/21 0308 05/23/21 0312 05/24/21 0417  AST 14* 17 13* 15  ALT 10 10 11 11   ALKPHOS 56 47 40 44  BILITOT 0.5 0.6 0.4 0.5  PROT 8.4* 6.9 6.2* 6.7  ALBUMIN 3.6 2.6* 2.4* 2.6*   No results for input(s): LIPASE, AMYLASE in the last 168 hours. No results for input(s): AMMONIA in the last 168 hours. CBC: Recent Labs  Lab 05/20/21 1328 05/20/21 1424 05/21/21 0843 05/22/21 0308 05/23/21 0312 05/24/21 0417  WBC 19.6*  --  17.1* 18.7* 11.9* 9.0  NEUTROABS 18.0*  --  14.7* 17.1* 10.8* 7.4  HGB 10.7* 11.2* 9.7* 9.4* 8.6* 9.4*  HCT 32.9* 33.0* 30.9* 28.4* 27.0* 28.8*  MCV 88.9  --  90.4 87.7 89.4 87.5  PLT 292  --  241 223 235 257   Cardiac Enzymes: No results for input(s): CKTOTAL, CKMB, CKMBINDEX, TROPONINI in the last 168 hours. BNP: BNP (last 3 results) No results for input(s): BNP in the last 8760 hours.  ProBNP (last 3 results) No results for input(s): PROBNP in the last 8760 hours.  CBG: Recent Labs  Lab 05/23/21 2022 05/23/21 2354 05/24/21 0347 05/24/21 0751 05/24/21 1152  GLUCAP 221* 206* 155* 124* 110*       Signed:  Kayleen Memos, MD [facility] 05/24/2021, 4:37 PM

## 2021-05-24 NOTE — Progress Notes (Signed)
Pt unable to follow commands in regards to swallowing. Therefore oral meds not given as patient would not swallow applesauce or ice cream, and held food in her mouth. Liquids produced a cough as well. Unable to follow commands also in regards to inhaler. MD and speech notified

## 2021-05-24 NOTE — TOC Progression Note (Signed)
Transition of Care [facility]) - Progression Note    Patient Details  Name: Sandra Sheppard MRN: [id number] Date of Birth: Apr 14, 1946  Transition of Care [facility]) CM/SW Contact  Purcell Mouton, RN Phone Number: 05/24/2021, 3:41 PM  Clinical Narrative:    [facility] cannot take pt related to COVID. Checking with [facility].    Expected Discharge Plan: [place] Barriers to Discharge: No Barriers Identified  Expected Discharge Plan and Services Expected Discharge Plan: [place]         Expected Discharge Date: 05/24/21                                     Social Determinants of Health (SDOH) Interventions    Readmission Risk Interventions No flowsheet data found.

## 2021-05-24 NOTE — Evaluation (Signed)
Clinical/Bedside Swallow Evaluation Patient Details  Name: Sandra Sheppard MRN: [id number] Date of Birth: 11/10/1945  Today's Date: 05/24/2021 Time: SLP Start Time (ACUTE ONLY): 0905 SLP Stop Time (ACUTE ONLY): 0925 SLP Time Calculation (min) (ACUTE ONLY): 20 min  Past Medical History:  Past Medical History:  Diagnosis Date   ABDOMINAL AORTIC ANEURYSM    Atrial fibrillation (HCC)    CAROTID STENOSIS    CKD (chronic kidney disease), stage V (HCC)    Complication of anesthesia    Confusion    CVD (cardiovascular disease)    Diabetes ([place])    no meds at present time   FIBRILLATION, ATRIAL    HYPERCHOLESTEROLEMIA  IIA    HYPERTENSION, MILD    MURMUR    Osteoporosis    PERIPHERAL VASCULAR DISEASE    PONV (postoperative nausea and vomiting)    STENOSIS, MITRAL AND AORTIC VALVES    Past Surgical History:  Past Surgical History:  Procedure Laterality Date   AORTIC VALVE REPLACEMENT     08/14/2008.  Dr. Roxy Manns   MYRINGOTOMY WITH TUBE PLACEMENT Bilateral 03/01/2019   Procedure: MYRINGOTOMY WITH  T TUBE PLACEMENT;  Surgeon: Leta Baptist, MD;  Location: [facility];  Service: ENT;  Laterality: Bilateral;   TOTAL ABDOMINAL HYSTERECTOMY     HPI:  Patient is a 75 y.o. female with PMH: HTN, HLD, CKD stage IV, DM-2, aortic valve replacement, dementia. She presented from SNF with c/o hypoxia, fever, COVID+, hyperglycemia. Patient was unable to give any history due to acute metabolic encephalopathy. BP stable, oxygen saturations 96% on 4L O2 nasal cannula, CXR showed bibasilar airspace disease suspicious for PNA.    Assessment / Plan / Recommendation  Clinical Impression  Patient presents with clinical s/s dyspahgia as per this bedside swallow evaluation. Suspect a primary cognitive-based dysphagia which likely from her dementia. (no family present to determine baseline). She allowed SLP to complete oral care via toothette sponge and although she initially winced and pulled away, she then  would readily open mouth for oral care. SLP removed small amount of sticky, thick secretions from oral cavity. Patient did accept cup of orange juice and held in hand and slowly started to bring up to lips, however she was unable to tip cup to give sip and required SLP to provide this assistance. No overt s/s aspiration or penetration observed with two small sips of thin liquids. On third attempt, patient not demonstrating adequate attention or awareness to cup. SLP is recommending to continue with full liquids diet at this time and only attempt to feed when patient alert and accepting. SLP to f/u with patient for diet toleration and ability to upgrade PO's.  SLP Visit Diagnosis: Dysphagia, unspecified (R13.10)    Aspiration Risk  Moderate aspiration risk;Mild aspiration risk;Risk for inadequate nutrition/hydration    Diet Recommendation Thin liquid;Other (Comment) (continue with full liquids)   Liquid Administration via: Cup Medication Administration: Crushed with puree Supervision: Full supervision/cueing for compensatory strategies;Staff to assist with self feeding Compensations: Slow rate;Small sips/bites;Other (Comment) (check for pocketing) Postural Changes: Seated upright at 90 degrees    Other  Recommendations Oral Care Recommendations: Oral care BID;Staff/trained caregiver to provide oral care;Oral care before and after PO    Recommendations for follow up therapy are one component of a multi-disciplinary discharge planning process, led by the attending physician.  Recommendations may be updated based on patient status, additional functional criteria and insurance authorization.  Follow up Recommendations Other (comment) (TBD)      Assistance  Recommended at Discharge Frequent or constant Supervision/Assistance  Functional Status Assessment Patient has had a recent decline in their functional status and demonstrates the ability to make significant improvements in function in a  reasonable and predictable amount of time.  Frequency and Duration min 1 x/week  1 week       Prognosis Prognosis for Safe Diet Advancement: Fair Barriers to Reach Goals: Cognitive deficits Barriers/Prognosis Comment: h/o dementia      Swallow Study   General Date of Onset: 05/20/21 HPI: Patient is a 75 y.o. female with PMH: HTN, HLD, CKD stage IV, DM-2, aortic valve replacement, dementia. She presented from SNF with c/o hypoxia, fever, COVID+, hyperglycemia. Patient was unable to give any history due to acute metabolic encephalopathy. BP stable, oxygen saturations 96% on 4L O2 nasal cannula, CXR showed bibasilar airspace disease suspicious for PNA. Type of Study: Bedside Swallow Evaluation Previous Swallow Assessment: none found Diet Prior to this Study: Thin liquids;Other (Comment) (full liquids) Temperature Spikes Noted: No Respiratory Status: Nasal cannula;Other (comment) (HFNC 3 L) History of Recent Intubation: No Behavior/Cognition: Alert;Confused;Lethargic/Drowsy;Requires cueing;Doesn't follow directions Oral Cavity Assessment: Other (comment) (mild amount of thick sticky secretions removed from oral cavity) Oral Care Completed by SLP: Yes Oral Cavity - Dentition: Adequate natural dentition Self-Feeding Abilities: Total assist Patient Positioning: Upright in bed Baseline Vocal Quality: Low vocal intensity Volitional Cough: Cognitively unable to elicit Volitional Swallow: Unable to elicit    Oral/Motor/Sensory Function Overall Oral Motor/Sensory Function: Other (comment) (Patient unable to participate in full oral motor exam however no assymetry or significant weakness observed)   Ice Chips     Thin Liquid Thin Liquid: Impaired Presentation: Cup Oral Phase Impairments: Poor awareness of bolus Oral Phase Functional Implications: Other (comment);Prolonged oral transit Pharyngeal  Phase Impairments: Other (comments) Other Comments: Patient did not exhbiit any overt s/s  aspiration or penetration with limited amount of thin liquids PO's.    Nectar Thick     Honey Thick     Puree Puree: Not tested   Solid     Solid: Not tested     Sonia Baller, MA, CCC-SLP Speech Therapy

## 2021-05-25 DIAGNOSIS — J1282 Pneumonia due to coronavirus disease 2019: Secondary | ICD-10-CM | POA: Diagnosis not present

## 2021-05-25 DIAGNOSIS — U071 COVID-19: Secondary | ICD-10-CM | POA: Diagnosis not present

## 2021-05-25 LAB — CULTURE, BLOOD (ROUTINE X 2)
Culture: NO GROWTH
Culture: NO GROWTH
Special Requests: ADEQUATE
Special Requests: ADEQUATE

## 2021-05-25 LAB — CBC WITH DIFFERENTIAL/PLATELET
Abs Immature Granulocytes: 0.3 10*3/uL — ABNORMAL HIGH (ref 0.00–0.07)
Basophils Absolute: 0 10*3/uL (ref 0.0–0.1)
Basophils Relative: 0 %
Eosinophils Absolute: 0 10*3/uL (ref 0.0–0.5)
Eosinophils Relative: 0 %
HCT: 29.7 % — ABNORMAL LOW (ref 36.0–46.0)
Hemoglobin: 9.4 g/dL — ABNORMAL LOW (ref 12.0–15.0)
Immature Granulocytes: 2 %
Lymphocytes Relative: 9 %
Lymphs Abs: 1.3 10*3/uL (ref 0.7–4.0)
MCH: 28.2 pg (ref 26.0–34.0)
MCHC: 31.6 g/dL (ref 30.0–36.0)
MCV: 89.2 fL (ref 80.0–100.0)
Monocytes Absolute: 1.4 10*3/uL — ABNORMAL HIGH (ref 0.1–1.0)
Monocytes Relative: 10 %
Neutro Abs: 11.5 10*3/uL — ABNORMAL HIGH (ref 1.7–7.7)
Neutrophils Relative %: 79 %
Platelets: 279 10*3/uL (ref 150–400)
RBC: 3.33 MIL/uL — ABNORMAL LOW (ref 3.87–5.11)
RDW: 14.2 % (ref 11.5–15.5)
WBC: 14.5 10*3/uL — ABNORMAL HIGH (ref 4.0–10.5)
nRBC: 0 % (ref 0.0–0.2)

## 2021-05-25 LAB — COMPREHENSIVE METABOLIC PANEL
ALT: 9 U/L (ref 0–44)
AST: 15 U/L (ref 15–41)
Albumin: 2.7 g/dL — ABNORMAL LOW (ref 3.5–5.0)
Alkaline Phosphatase: 45 U/L (ref 38–126)
Anion gap: 11 (ref 5–15)
BUN: 61 mg/dL — ABNORMAL HIGH (ref 8–23)
CO2: 23 mmol/L (ref 22–32)
Calcium: 8.7 mg/dL — ABNORMAL LOW (ref 8.9–10.3)
Chloride: 112 mmol/L — ABNORMAL HIGH (ref 98–111)
Creatinine, Ser: 2.95 mg/dL — ABNORMAL HIGH (ref 0.44–1.00)
GFR, Estimated: 16 mL/min — ABNORMAL LOW (ref >60)
Glucose, Bld: 115 mg/dL — ABNORMAL HIGH (ref 70–99)
Potassium: 4.1 mmol/L (ref 3.5–5.1)
Sodium: 146 mmol/L — ABNORMAL HIGH (ref 135–145)
Total Bilirubin: 0.4 mg/dL (ref 0.3–1.2)
Total Protein: 6.7 g/dL (ref 6.5–8.1)

## 2021-05-25 LAB — D-DIMER, QUANTITATIVE: D-Dimer, Quant: 1.95 ug/mL-FEU — ABNORMAL HIGH (ref 0.00–0.50)

## 2021-05-25 LAB — GLUCOSE, CAPILLARY
Glucose-Capillary: 115 mg/dL — ABNORMAL HIGH (ref 70–99)
Glucose-Capillary: 115 mg/dL — ABNORMAL HIGH (ref 70–99)
Glucose-Capillary: 195 mg/dL — ABNORMAL HIGH (ref 70–99)
Glucose-Capillary: 54 mg/dL — ABNORMAL LOW (ref 70–99)
Glucose-Capillary: 75 mg/dL (ref 70–99)

## 2021-05-25 LAB — FERRITIN: Ferritin: 249 ng/mL (ref 11–307)

## 2021-05-25 LAB — C-REACTIVE PROTEIN: CRP: 9.7 mg/dL — ABNORMAL HIGH (ref <1.0)

## 2021-05-25 NOTE — Discharge Summary (Signed)
Discharge Summary  TOPEKA GIAMMONA YHC:[id number] DOB: 1946-04-05  PCP: Prince Solian, MD  Admit date: 05/20/2021 Discharge date: 05/25/2021  Time spent: 35-minutes  Recommendations for Outpatient Follow-up:  Continue with hospice care.  Discharge Diagnoses:  Active Hospital Problems   Diagnosis Date Noted   Pneumonia due to COVID-19 virus 05/20/2021   Pressure injury of skin 51/76/1607   Acute metabolic encephalopathy 37/03/6268   Dementia with behavioral disturbance 03/17/2021   CKD (chronic kidney disease) stage 4, GFR 15-29 ml/min (HCC) 08/13/2020   Acute kidney injury superimposed on CKD ([place])    AKI (acute kidney injury) ([place]) 48/54/6270   Metabolic acidosis, increased anion gap 08/08/2018   Diabetes mellitus type II, non insulin dependent ([place]) 08/08/2018   H/O aortic valve replacement 08/14/2008   HYPERCHOLESTEROLEMIA  IIA 06/23/2008   HYPERTENSION, MILD 06/23/2008    Resolved Hospital Problems  No resolved problems to display.    Discharge Condition: Stable  Diet recommendation: Per speech therapist recommendations:  Aspiration Risk   Moderate aspiration risk;Mild aspiration risk;Risk for inadequate nutrition/hydration     Diet Recommendation Thin liquid;Other (Comment) (continue with full liquids)    Liquid Administration via: Cup Medication Administration: Crushed with puree Supervision: Full supervision/cueing for compensatory strategies;Staff to assist with self feeding Compensations: Slow rate;Small sips/bites;Other (Comment) (check for pocketing) Postural Changes: Seated upright at 90 degrees     Other  Recommendations Oral Care Recommendations: Oral care BID;Staff/trained caregiver to provide oral care;Oral care before and after PO       Vitals:   05/24/21 1941 05/25/21 0410  BP: (!) 147/73 (!) 142/74  Pulse: 88 73  Resp: 16 16  Temp: 98.8 F (37.1 C) 97.8 F (36.6 C)  SpO2: 90% 97%    History of present illness:  Sandra Sheppard is a  75 y.o. female with medical history significant for hypertension, hyperlipidemia, CKD stage IV, diabetes mellitus type 2, aortic valve replacement, advanced dementia, presenting from SN[facility] with complaints of hypoxia, fever, COVID-positive, and hyperglycemia.  Admitted with COVID-19 viral pneumonia with concern for superimposed bacterial pneumonia.  Hospital course complicated by persistent delirium, poor oral intake, episodes of hypoglycemia requiring IV dextrose fluid hydration.  On 05/24/2021 family made decision for discharge to hospice facility.    05/25/21: Patient was seen and examined at her bedside.  She is somnolent, and minimally responsive.    Hospital Course:  Principal Problem:   Pneumonia due to COVID-19 virus Active Problems:   HYPERCHOLESTEROLEMIA  IIA   HYPERTENSION, MILD   H/O aortic valve replacement   AKI (acute kidney injury) (HCC)   Metabolic acidosis, increased anion gap   Diabetes mellitus type II, non insulin dependent (HCC)   Acute kidney injury superimposed on CKD (HCC)   CKD (chronic kidney disease) stage 4, GFR 15-29 ml/min (HCC)   Dementia with behavioral disturbance   Acute metabolic encephalopathy   Pressure injury of skin  Sepsis, resolved, likely 2/2 pneumonia due to COVID-19 infection with concern for bacterial pulmonary infection, E. coli UTI, POA   Nonoliguric prerenal AKI on CKD stage IV  Resolved anion gap metabolic acidosis secondary to acute renal failure    Treated E.coli UTI, POA   Essential hypertension, stable   Resolved acute hypoxic respiratory failure   Type 2 diabetes mellitus with episodes of hypoglycemia from poor oral intake.   Persistent acute metabolic encephalopathy   Resolved post repletion: Hyponatremia/hyperkalemia   Stable anemia of chronic kidney disease   Elevated D-dimer, suspect COVID-19 infection related.  Paroxysmal A. Fib, rate controlled   History of CVA Continue with aspirin, statins once  able   Dementia with behavioral disturbance and hallucination   Physical debility     Code Status: DNR     Consultants: Nephrology Hospice   Procedures: None  Procedures: None    Discharge Exam: BP (!) 142/74 (BP Location: Left Arm)    Pulse 73    Temp 97.8 F (36.6 C) (Oral)    Resp 16    Ht 5\' 5"  (1.651 m)    Wt 50.7 kg    SpO2 97%    BMI 18.60 kg/m  General: 75 y.o. year-old female Frail in no acute distress.  Minimally responsive. Cardiovascular: Regular rate and rhythm no rubs or gallops.  No JVD or thyromegaly noted.   Respiratory: Clear to auscultation with no wheezes or rales.  Poor inspiratory effort.   Abdomen: Soft nontender normal bowel sounds present.   Musculoskeletal: Trace lower extremity edema bilaterally. Psychiatry: Unable to assess mood due to obtundation.  Discharge Instructions You were cared for by a hospitalist during your hospital stay. If you have any questions about your discharge medications or the care you received while you were in the hospital after you are discharged, you can call the unit and asked to speak with the hospitalist on call if the hospitalist that took care of you is not available. Once you are discharged, your primary care physician will handle any further medical issues. Please note that NO REFILLS for any discharge medications will be authorized once you are discharged, as it is imperative that you return to your primary care physician (or establish a relationship with a primary care physician if you do not have one) for your aftercare needs so that they can reassess your need for medications and monitor your lab values.   Allergies as of 05/25/2021       Reactions   Ativan [lorazepam] Other (See Comments)   Confusion   Codeine Other (See Comments)   Rash, Nausea and Vomiting    Sulfonamide Derivatives Other (See Comments)   Unspecified   Penicillins Rash   Has patient had a PCN reaction causing immediate rash,  facial/tongue/throat swelling, SOB or lightheadedness with hypotension: YES Has patient had a PCN reaction causing severe rash involving mucus membranes or skin necrosis:NO Has patient had a PCN reaction that required hospitalization NO Has patient had a PCN reaction occurring within the last 10 years: NO If all of the above answers are "NO", then may proceed with Cephalosporin use.        Medication List     TAKE these medications    acetaminophen 325 MG tablet Commonly known as: TYLENOL Take 650 mg by mouth every 4 (four) hours as needed for mild pain. Do not exceed 3,000 mg in 24 hrs   amLODipine 10 MG tablet Commonly known as: NORVASC Take 1 tablet (10 mg total) by mouth daily.   ascorbic acid 500 MG tablet Commonly known as: VITAMIN C Take 1 tablet (500 mg total) by mouth daily.   aspirin 81 MG tablet Take 81 mg by mouth daily. 0900   Choline Fenofibrate 45 MG capsule Take 45 mg by mouth daily. 0900   cyanocobalamin 1000 MCG tablet Take 1 tablet (1,000 mcg total) by mouth daily. What changed: additional instructions   divalproex 125 MG DR tablet Commonly known as: DEPAKOTE Take 125 mg by mouth 2 (two) times daily. 0900, 1700   docusate sodium 100 MG capsule Commonly  known as: COLACE Take 1 capsule (100 mg total) by mouth 2 (two) times daily. What changed: additional instructions   escitalopram 5 MG tablet Commonly known as: LEXAPRO Take 5 mg by mouth daily. 0900   loratadine 10 MG tablet Commonly known as: CLARITIN Take 10 mg by mouth daily as needed for allergies.   meclizine 12.5 MG tablet Commonly known as: ANTIVERT Take 12.5 mg by mouth 3 (three) times daily as needed for dizziness.   Melatonin 10 MG Tabs Take 10 mg by mouth at bedtime. 2100   morphine CONCENTRATE 10 mg / 0.5 ml concentrated solution Take 5 mg by mouth every hour as needed for severe pain or shortness of breath.   multivitamin capsule Take 1 capsule by mouth daily. 0900    OVER THE COUNTER MEDICATION Take 237 mLs by mouth 3 (three) times daily between meals. House Supplement for nutritional support. 0900, 1400, 2100.   PreserVision AREDS 2+Multi Vit Caps Take 1 capsule by mouth daily at 6 (six) AM. 0900   QUEtiapine 25 MG tablet Commonly known as: SEROQUEL Take 1 tablet (25 mg total) by mouth at bedtime. What changed: additional instructions   rosuvastatin 5 MG tablet Commonly known as: CRESTOR Take 5 mg by mouth daily. 0900   senna-docusate 8.6-50 MG tablet Commonly known as: Senokot-S Take 1 tablet by mouth at bedtime. What changed: additional instructions   sodium bicarbonate 650 MG tablet Take 1 tablet (650 mg total) by mouth 3 (three) times daily.   Vitamin D-3 25 MCG (1000 UT) Caps Take 2,000 Units by mouth daily. 0900   zinc sulfate 220 (50 Zn) MG capsule Take 1 capsule (220 mg total) by mouth daily.   zoledronic acid 5 MG/100ML Soln injection Commonly known as: RECLAST Inject 5 mg into the vein See admin instructions. Once a year        Allergies  Allergen Reactions   Ativan [Lorazepam] Other (See Comments)    Confusion   Codeine Other (See Comments)    Rash, Nausea and Vomiting    Sulfonamide Derivatives Other (See Comments)    Unspecified   Penicillins Rash    Has patient had a PCN reaction causing immediate rash, facial/tongue/throat swelling, SOB or lightheadedness with hypotension: YES Has patient had a PCN reaction causing severe rash involving mucus membranes or skin necrosis:NO Has patient had a PCN reaction that required hospitalization NO Has patient had a PCN reaction occurring within the last 10 years: NO If all of the above answers are "NO", then may proceed with Cephalosporin use.      The results of significant diagnostics from this hospitalization (including imaging, microbiology, ancillary and laboratory) are listed below for reference.    Significant Diagnostic Studies: CT HEAD WO CONTRAST  (5MM)  Result Date: 05/03/2021 CLINICAL DATA:  Neck trauma. EXAM: CT HEAD WITHOUT CONTRAST CT CERVICAL SPINE WITHOUT CONTRAST TECHNIQUE: Multidetector CT imaging of the head and cervical spine was performed following the standard protocol without intravenous contrast. Multiplanar CT image reconstructions of the cervical spine were also generated. COMPARISON:  CT dated 03/17/2021. FINDINGS: CT HEAD FINDINGS Brain: Moderate age-related atrophy and chronic microvascular ischemic changes. There is no acute intracranial hemorrhage. No mass effect or midline shift. No extra-axial fluid collection. Vascular: No hyperdense vessel or unexpected calcification. Skull: Normal. Negative for fracture or focal lesion. Sinuses/Orbits: No acute finding. Other: None CT CERVICAL SPINE FINDINGS Alignment: No acute subluxation. Skull base and vertebrae: No acute fracture. Soft tissues and spinal canal: No prevertebral fluid  or swelling. No visible canal hematoma. Disc levels:  Degenerative changes primarily at C4-C5 and C5-C6. Upper chest: Emphysema. Other: Bilateral carotid bulb calcified plaques. IMPRESSION: 1. No acute intracranial pathology. Moderate age-related atrophy and chronic microvascular ischemic changes. 2. No acute/traumatic cervical spine pathology. Electronically Signed   By: Anner Crete M.D.   On: 05/03/2021 21:44   CT Cervical Spine Wo Contrast  Result Date: 05/03/2021 CLINICAL DATA:  Neck trauma. EXAM: CT HEAD WITHOUT CONTRAST CT CERVICAL SPINE WITHOUT CONTRAST TECHNIQUE: Multidetector CT imaging of the head and cervical spine was performed following the standard protocol without intravenous contrast. Multiplanar CT image reconstructions of the cervical spine were also generated. COMPARISON:  CT dated 03/17/2021. FINDINGS: CT HEAD FINDINGS Brain: Moderate age-related atrophy and chronic microvascular ischemic changes. There is no acute intracranial hemorrhage. No mass effect or midline shift. No  extra-axial fluid collection. Vascular: No hyperdense vessel or unexpected calcification. Skull: Normal. Negative for fracture or focal lesion. Sinuses/Orbits: No acute finding. Other: None CT CERVICAL SPINE FINDINGS Alignment: No acute subluxation. Skull base and vertebrae: No acute fracture. Soft tissues and spinal canal: No prevertebral fluid or swelling. No visible canal hematoma. Disc levels:  Degenerative changes primarily at C4-C5 and C5-C6. Upper chest: Emphysema. Other: Bilateral carotid bulb calcified plaques. IMPRESSION: 1. No acute intracranial pathology. Moderate age-related atrophy and chronic microvascular ischemic changes. 2. No acute/traumatic cervical spine pathology. Electronically Signed   By: Anner Crete M.D.   On: 05/03/2021 21:44   US RENAL  Result Date: 05/20/2021 CLINICAL DATA:  Acute kidney injury. EXAM: RENAL / URINARY TRACT ULTRASOUND COMPLETE COMPARISON:  08/08/2018 FINDINGS: Right Kidney: Renal measurements: 9.5 by 3.7 by 4.7 cm = volume: 86 mL. Echogenicity within normal limits. No solid mass or hydronephrosis visualized. Two right renal cysts are present, 11.1 cm in diameter in the other 1.0 cm in diameter. Left Kidney: Renal measurements: 8.9 by 4.6 by 3.3 cm = volume: 70 mL. Echogenicity within normal limits. No solid mass or hydronephrosis visualized. A left renal cyst measures 1.6 by 1.4 by 1.5 cm. Bladder: Irregularity posteriorly in the urinary bladder nonspecific but could be secondary to nondistention. Other: Cholelithiasis noted. IMPRESSION: 1. Relatively small kidneys favoring renal atrophy. 2. Small bilateral renal cysts. 3. Irregularity posteriorly in the urinary bladder is nonspecific but possibly secondary to nondistention. 4. Cholelithiasis. Electronically Signed   By: Van Clines M.D.   On: 05/20/2021 20:36   DG Chest Port 1 View  Result Date: 05/20/2021 CLINICAL DATA:  Hypoxia.  COVID-19 positive. EXAM: PORTABLE CHEST 1 VIEW COMPARISON:  03/17/2021  FINDINGS: Prior median sternotomy. Numerous leads and wires project over the chest. Midline trachea. Normal heart size. Atherosclerosis in the transverse aorta. No pleural effusion or pneumothorax. Airspace disease in both medial lower lobes. IMPRESSION: Bibasilar airspace disease, suspicious for pneumonia. Aortic Atherosclerosis (ICD10-I70.0). Electronically Signed   By: Abigail Miyamoto M.D.   On: 05/20/2021 14:08   VAS [place] LOWER EXTREMITY VENOUS (DVT)  Result Date: 05/20/2021  Lower Venous DVT Study Patient Name:  TAMIE MINTEER  Date of Exam:   05/20/2021 Medical Rec #: [id number]       Accession #:    [id number] Date of Birth: 06/03/1946        Patient Gender: F Patient Age:   6 years Exam Location:  [facility] Procedure:      VAS [place] LOWER EXTREMITY VENOUS (DVT) Referring Phys: [facility] EZENDUKA --------------------------------------------------------------------------------  Indications: Edema.  Risk Factors: COVID 19 positive. Limitations: Poor ultrasound/tissue  interface and patient positioning, patient movement, poor patient cooperation. Comparison Study: No prior studies. Performing Technologist: Oliver Hum RVT  Examination Guidelines: A complete evaluation includes B-mode imaging, spectral Doppler, color Doppler, and power Doppler as needed of all accessible portions of each vessel. Bilateral testing is considered an integral part of a complete examination. Limited examinations for reoccurring indications may be performed as noted. The reflux portion of the exam is performed with the patient in reverse Trendelenburg.  +---------+---------------+---------+-----------+----------+--------------+  RIGHT     Compressibility Phasicity Spontaneity Properties Thrombus Aging  +---------+---------------+---------+-----------+----------+--------------+  CFV       Full            Yes       Yes                                    +---------+---------------+---------+-----------+----------+--------------+   SFJ       Full                                                             +---------+---------------+---------+-----------+----------+--------------+  FV Prox   Full                                                             +---------+---------------+---------+-----------+----------+--------------+  FV Mid    Full                                                             +---------+---------------+---------+-----------+----------+--------------+  FV Distal                 Yes       Yes                                    +---------+---------------+---------+-----------+----------+--------------+  PFV       Full                                                             +---------+---------------+---------+-----------+----------+--------------+  POP       Full            Yes       Yes                                    +---------+---------------+---------+-----------+----------+--------------+  PTV       Full                                                             +---------+---------------+---------+-----------+----------+--------------+  PERO      Full                                                             +---------+---------------+---------+-----------+----------+--------------+   +---------+---------------+---------+-----------+----------+--------------+  LEFT      Compressibility Phasicity Spontaneity Properties Thrombus Aging  +---------+---------------+---------+-----------+----------+--------------+  CFV       Full            Yes       Yes                                    +---------+---------------+---------+-----------+----------+--------------+  SFJ       Full                                                             +---------+---------------+---------+-----------+----------+--------------+  FV Prox   Full                                                             +---------+---------------+---------+-----------+----------+--------------+  FV Mid    Full                                                              +---------+---------------+---------+-----------+----------+--------------+  FV Distal Full                                                             +---------+---------------+---------+-----------+----------+--------------+  PFV       Full                                                             +---------+---------------+---------+-----------+----------+--------------+  POP       Full            Yes       Yes                                    +---------+---------------+---------+-----------+----------+--------------+  PTV       Full                                                             +---------+---------------+---------+-----------+----------+--------------+  PERO      Full                                                             +---------+---------------+---------+-----------+----------+--------------+     Summary: RIGHT: - There is no evidence of deep vein thrombosis in the lower extremity. However, portions of this examination were limited- see technologist comments above.  - No cystic structure found in the popliteal fossa.  LEFT: - There is no evidence of deep vein thrombosis in the lower extremity. However, portions of this examination were limited- see technologist comments above.  - No cystic structure found in the popliteal fossa.  *See table(s) above for measurements and observations. Electronically signed by Orlie Pollen on 05/20/2021 at 4:58:59 PM.    Final     Microbiology: Recent Results (from the past 240 hour(s))  Resp Panel by RT-PCR (Flu A&B, Covid) Nasopharyngeal Swab     Status: Abnormal   Collection Time: 05/20/21  1:15 PM   Specimen: Nasopharyngeal Swab; Nasopharyngeal(NP) swabs in vial transport medium  Result Value Ref Range Status   SARS Coronavirus 2 by RT PCR POSITIVE (A) NEGATIVE Final    Comment: RESULT CALLED TO, READ BACK BY AND VERIFIED WITH: BRATU,D. EMTP AT 1434 05/20/21 MULLINS,T (NOTE) SARS-CoV-2 target nucleic acids are  DETECTED.  The SARS-CoV-2 RNA is generally detectable in upper respiratory specimens during the acute phase of infection. Positive results are indicative of the presence of the identified virus, but do not rule out bacterial infection or co-infection with other pathogens not detected by the test. Clinical correlation with patient history and other diagnostic information is necessary to determine patient infection status. The expected result is Negative.  Fact Sheet for Patients: EntrepreneurPulse.com.au  Fact Sheet for Healthcare Providers: IncredibleEmployment.be  This test is not yet approved or cleared by the [place] FDA and  has been authorized for detection and/or diagnosis of SARS-CoV-2 by FDA under an Emergency Use Authorization (EUA).  This EUA will remain in effect (meaning this test  can be used) for the duration of  the COVID-19 declaration under Section 564(b)(1) of the Act, 21 U.S.C. section 360bbb-3(b)(1), unless the authorization is terminated or revoked sooner.     Influenza A by PCR NEGATIVE NEGATIVE Final   Influenza B by PCR NEGATIVE NEGATIVE Final    Comment: (NOTE) The Xpert Xpress SARS-CoV-2/FLU/RSV plus assay is intended as an aid in the diagnosis of influenza from Nasopharyngeal swab specimens and should not be used as a sole basis for treatment. Nasal washings and aspirates are unacceptable for Xpert Xpress SARS-CoV-2/FLU/RSV testing.  Fact Sheet for Patients: EntrepreneurPulse.com.au  Fact Sheet for Healthcare Providers: IncredibleEmployment.be  This test is not yet approved or cleared by the [place] FDA and has been authorized for detection and/or diagnosis of SARS-CoV-2 by FDA under an Emergency Use Authorization (EUA). This EUA will remain in effect (meaning this test can be used) for the duration of the COVID-19 declaration under Section 564(b)(1) of the Act, 21  U.S.C. section 360bbb-3(b)(1), unless the authorization is terminated or revoked.  Performed at [facility], [place] [address] [place], [place] [address]   Blood Culture (routine x 2)     Status: None   Collection Time: 05/20/21  1:15 PM   Specimen: BLOOD  Result  Value Ref Range Status   Specimen Description   Final    BLOOD BLOOD LEFT HAND Performed at [facility] [address] [place], [place] [address]    Special Requests   Final    BOTTLES DRAWN AEROBIC AND ANAEROBIC Blood Culture adequate volume Performed at [facility] [address], [place], [place] [address]    Culture   Final    NO GROWTH 5 DAYS Performed at [facility], [place] [address], [place], [place] [address]    Report Status 05/25/2021 FINAL  Final  Blood Culture (routine x 2)     Status: None   Collection Time: 05/20/21  1:28 PM   Specimen: BLOOD  Result Value Ref Range Status   Specimen Description   Final    BLOOD BLOOD RIGHT HAND Performed at [facility] [address], [place], [place] [address]    Special Requests   Final    BOTTLES DRAWN AEROBIC AND ANAEROBIC Blood Culture adequate volume Performed at [facility] [address], [place], [place] [address]    Culture   Final    NO GROWTH 5 DAYS Performed at [facility], [place] [address], [place], [place] [address]    Report Status 05/25/2021 FINAL  Final  Urine Culture     Status: Abnormal   Collection Time: 05/20/21  3:39 PM   Specimen: In/Out Cath Urine  Result Value Ref Range Status   Specimen Description   Final    IN/OUT CATH URINE Performed at [facility] [address] [place], [place] [address]    Special Requests   Final    NONE Performed at [facility], [place] [address], [place], [place] [address]    Culture >=100,000 COLONIES/mL ESCHERICHIA COLI (A)  Final   Report Status  05/22/2021 FINAL  Final   Organism ID, Bacteria ESCHERICHIA COLI (A)  Final      Susceptibility   Escherichia coli - MIC*    AMPICILLIN 4 SENSITIVE Sensitive     CEFAZOLIN <=4 SENSITIVE Sensitive     CEFEPIME <=0.12 SENSITIVE Sensitive     CEFTRIAXONE <=0.25 SENSITIVE Sensitive     CIPROFLOXACIN 0.5 INTERMEDIATE Intermediate     GENTAMICIN <=1 SENSITIVE Sensitive     IMIPENEM <=0.25 SENSITIVE Sensitive     NITROFURANTOIN <=16 SENSITIVE Sensitive     TRIMETH/SULFA >=320 RESISTANT Resistant     AMPICILLIN/SULBACTAM <=2 SENSITIVE Sensitive     PIP/TAZO <=4 SENSITIVE Sensitive     * >=100,000 COLONIES/mL ESCHERICHIA COLI  MRSA Next Gen by PCR, Nasal     Status: None   Collection Time: 05/21/21 12:30 AM   Specimen: Nasal Mucosa; Nasal Swab  Result Value Ref Range Status   MRSA by PCR Next Gen NOT DETECTED NOT DETECTED Final    Comment: (NOTE) The GeneXpert MRSA Assay (FDA approved for NASAL specimens only), is one component of a comprehensive MRSA colonization surveillance program. It is not intended to diagnose MRSA infection nor to guide or monitor treatment for MRSA infections. Test performance is not FDA approved in patients less than 64 years old. Performed at [facility], [place] [address] [place], [place] [address]      Labs: Basic Metabolic Panel: Recent Labs  Lab 05/21/21 0843 05/22/21 0308 05/23/21 0312 05/24/21 0417 05/25/21 0416  NA 137 138 140 141 146*  K 4.2 4.9 4.1 4.3 4.1  CL 107 109 112* 112*  112*  CO2 18* 19* 19* 18* 23  GLUCOSE 110* 270* 231* 177* 115*  BUN 79* 69* 69* 63* 61*  CREATININE 3.59* 3.23* 2.80* 2.73* 2.95*  CALCIUM 8.4* 8.4* 8.2* 8.4* 8.7*  MG 2.0  --   --   --   --    Liver Function Tests: Recent Labs  Lab 05/20/21 1328 05/22/21 0308 05/23/21 0312 05/24/21 0417 05/25/21 0416  AST 14* 17 13* 15 15  ALT 10 10 11 11 9   ALKPHOS 56 47 40 44 45  BILITOT 0.5 0.6 0.4 0.5 0.4  PROT 8.4* 6.9 6.2* 6.7 6.7  ALBUMIN 3.6  2.6* 2.4* 2.6* 2.7*   No results for input(s): LIPASE, AMYLASE in the last 168 hours. No results for input(s): AMMONIA in the last 168 hours. CBC: Recent Labs  Lab 05/21/21 0843 05/22/21 0308 05/23/21 0312 05/24/21 0417 05/25/21 0416  WBC 17.1* 18.7* 11.9* 9.0 14.5*  NEUTROABS 14.7* 17.1* 10.8* 7.4 11.5*  HGB 9.7* 9.4* 8.6* 9.4* 9.4*  HCT 30.9* 28.4* 27.0* 28.8* 29.7*  MCV 90.4 87.7 89.4 87.5 89.2  PLT 241 223 235 257 279   Cardiac Enzymes: No results for input(s): CKTOTAL, CKMB, CKMBINDEX, TROPONINI in the last 168 hours. BNP: BNP (last 3 results) No results for input(s): BNP in the last 8760 hours.  ProBNP (last 3 results) No results for input(s): PROBNP in the last 8760 hours.  CBG: Recent Labs  Lab 05/24/21 1937 05/25/21 0019 05/25/21 0121 05/25/21 0405 05/25/21 0753  GLUCAP 125* 54* 195* 115* 75       Signed:  Kayleen Memos, MD [facility] 05/25/2021, 10:45 AM

## 2021-05-25 NOTE — TOC Progression Note (Signed)
Transition of Care [facility]) - Progression Note    Patient Details  Name: Sandra Sheppard MRN: [id number] Date of Birth: [birth year]  Transition of Care [facility]) CM/SW Contact  Purcell Mouton, RN Phone Number: 05/25/2021, 9:25 AM  Clinical Narrative:    Waiting for Hospice bed. Spoke with pt's sister [name] via telephone who agreed with [facility] [place].   Expected Discharge Plan: [place] Barriers to Discharge: No Barriers Identified  Expected Discharge Plan and Services Expected Discharge Plan: [place]         Expected Discharge Date: 05/24/21                                     Social Determinants of Health (SDOH) Interventions    Readmission Risk Interventions No flowsheet data found.

## 2021-05-25 NOTE — TOC Progression Note (Signed)
Transition of Care [facility]) - Progression Note    Patient Details  Name: Sandra Sheppard MRN: [id number] Date of Birth: 1945-10-25  Transition of Care [facility]) CM/SW Contact  Purcell Mouton, RN Phone Number: 05/25/2021, 12:32 PM  Clinical Narrative:    Pt's called and RN to make aware that [facility] was called for transportation to [place].    Expected Discharge Plan: [place] Barriers to Discharge: No Barriers Identified  Expected Discharge Plan and Services Expected Discharge Plan: [place]         Expected Discharge Date: 05/25/21                                     Social Determinants of Health (SDOH) Interventions    Readmission Risk Interventions No flowsheet data found.

## 2021-05-26 ENCOUNTER — Ambulatory Visit (HOSPITAL_COMMUNITY): Payer: Medicare HMO | Attending: Cardiology

## 2021-05-27 ENCOUNTER — Encounter (HOSPITAL_COMMUNITY): Payer: Self-pay | Admitting: Cardiology

## 2021-05-31 ENCOUNTER — Ambulatory Visit (HOSPITAL_COMMUNITY)
Admission: RE | Admit: 2021-05-31 | Payer: Medicare HMO | Source: Ambulatory Visit | Attending: Cardiology | Admitting: Cardiology

## 2021-06-13 DEATH — deceased

## 2021-12-17 ENCOUNTER — Encounter: Payer: Self-pay | Admitting: Cardiology
